# Patient Record
Sex: Female | Born: 1945 | Race: White | Hispanic: No | Smoking: Former smoker
Health system: Southern US, Community
[De-identification: ages and names within clinical notes are randomized; demographics above are authoritative.]

## PROBLEM LIST (undated history)

## (undated) DIAGNOSIS — E739 Lactose intolerance, unspecified: Secondary | ICD-10-CM

## (undated) DIAGNOSIS — I10 Essential (primary) hypertension: Secondary | ICD-10-CM

## (undated) DIAGNOSIS — R51 Headache: Secondary | ICD-10-CM

## (undated) DIAGNOSIS — M549 Dorsalgia, unspecified: Secondary | ICD-10-CM

## (undated) DIAGNOSIS — K143 Hypertrophy of tongue papillae: Secondary | ICD-10-CM

## (undated) DIAGNOSIS — R0600 Dyspnea, unspecified: Secondary | ICD-10-CM

## (undated) DIAGNOSIS — E785 Hyperlipidemia, unspecified: Principal | ICD-10-CM

## (undated) DIAGNOSIS — Z9189 Other specified personal risk factors, not elsewhere classified: Secondary | ICD-10-CM

## (undated) DIAGNOSIS — R7303 Prediabetes: Secondary | ICD-10-CM

## (undated) DIAGNOSIS — J449 Chronic obstructive pulmonary disease, unspecified: Secondary | ICD-10-CM

## (undated) DIAGNOSIS — R350 Frequency of micturition: Secondary | ICD-10-CM

## (undated) HISTORY — DX: Hyperlipidemia, unspecified: E78.5

## (undated) HISTORY — PX: TUBAL LIGATION: SHX77

## (undated) HISTORY — DX: Essential (primary) hypertension: I10

## (undated) HISTORY — DX: Hypertrophy of tongue papillae: K14.3

## (undated) HISTORY — DX: Other specified personal risk factors, not elsewhere classified: Z91.89

## (undated) HISTORY — DX: Dorsalgia, unspecified: M54.9

## (undated) HISTORY — DX: Headache: R51

## (undated) HISTORY — DX: Lactose intolerance, unspecified: E73.9

## (undated) HISTORY — PX: TONSILLECTOMY: SUR1361

## (undated) HISTORY — DX: Chronic obstructive pulmonary disease, unspecified: J44.9

## (undated) HISTORY — PX: DILATION AND CURETTAGE OF UTERUS: SHX78

## (undated) HISTORY — DX: Frequency of micturition: R35.0

---

## 1983-03-30 HISTORY — PX: DILATION AND CURETTAGE OF UTERUS: SHX78

## 1983-03-30 HISTORY — PX: TUBAL LIGATION: SHX77

## 2000-08-22 ENCOUNTER — Emergency Department (HOSPITAL_COMMUNITY): Admission: EM | Admit: 2000-08-22 | Discharge: 2000-08-22 | Payer: Self-pay | Admitting: Emergency Medicine

## 2001-03-29 HISTORY — PX: CARDIAC CATHETERIZATION: SHX172

## 2001-05-31 ENCOUNTER — Other Ambulatory Visit: Admission: RE | Admit: 2001-05-31 | Discharge: 2001-05-31 | Payer: Self-pay | Admitting: *Deleted

## 2001-11-06 ENCOUNTER — Encounter: Admission: RE | Admit: 2001-11-06 | Discharge: 2001-11-06 | Payer: Self-pay | Admitting: Internal Medicine

## 2001-11-06 ENCOUNTER — Encounter: Payer: Self-pay | Admitting: Internal Medicine

## 2002-03-29 ENCOUNTER — Encounter: Payer: Self-pay | Admitting: Internal Medicine

## 2002-03-29 LAB — CONVERTED CEMR LAB

## 2002-05-24 ENCOUNTER — Encounter: Payer: Self-pay | Admitting: Internal Medicine

## 2002-05-24 ENCOUNTER — Inpatient Hospital Stay (HOSPITAL_COMMUNITY): Admission: EM | Admit: 2002-05-24 | Discharge: 2002-05-26 | Payer: Self-pay | Admitting: Emergency Medicine

## 2002-09-06 ENCOUNTER — Other Ambulatory Visit: Admission: RE | Admit: 2002-09-06 | Discharge: 2002-09-06 | Payer: Self-pay | Admitting: *Deleted

## 2004-04-17 ENCOUNTER — Other Ambulatory Visit: Admission: RE | Admit: 2004-04-17 | Discharge: 2004-04-17 | Payer: Self-pay | Admitting: *Deleted

## 2004-05-08 ENCOUNTER — Encounter: Admission: RE | Admit: 2004-05-08 | Discharge: 2004-05-08 | Payer: Self-pay | Admitting: *Deleted

## 2005-07-08 ENCOUNTER — Encounter: Admission: RE | Admit: 2005-07-08 | Discharge: 2005-07-08 | Payer: Self-pay | Admitting: Obstetrics and Gynecology

## 2005-07-08 ENCOUNTER — Other Ambulatory Visit: Admission: RE | Admit: 2005-07-08 | Discharge: 2005-07-08 | Payer: Self-pay | Admitting: Obstetrics and Gynecology

## 2005-07-12 ENCOUNTER — Ambulatory Visit: Payer: Self-pay | Admitting: Endocrinology

## 2005-07-17 ENCOUNTER — Ambulatory Visit: Payer: Self-pay | Admitting: Family Medicine

## 2006-01-06 ENCOUNTER — Ambulatory Visit: Payer: Self-pay | Admitting: Internal Medicine

## 2006-05-18 ENCOUNTER — Emergency Department (HOSPITAL_COMMUNITY): Admission: EM | Admit: 2006-05-18 | Discharge: 2006-05-18 | Payer: Self-pay | Admitting: Family Medicine

## 2006-11-01 ENCOUNTER — Encounter: Payer: Self-pay | Admitting: Internal Medicine

## 2006-11-01 DIAGNOSIS — J449 Chronic obstructive pulmonary disease, unspecified: Secondary | ICD-10-CM | POA: Insufficient documentation

## 2006-11-01 DIAGNOSIS — J4489 Other specified chronic obstructive pulmonary disease: Secondary | ICD-10-CM

## 2006-11-01 DIAGNOSIS — I10 Essential (primary) hypertension: Secondary | ICD-10-CM | POA: Insufficient documentation

## 2006-11-01 HISTORY — DX: Other specified chronic obstructive pulmonary disease: J44.89

## 2006-11-01 HISTORY — DX: Essential (primary) hypertension: I10

## 2006-11-01 HISTORY — DX: Chronic obstructive pulmonary disease, unspecified: J44.9

## 2006-12-09 ENCOUNTER — Other Ambulatory Visit: Admission: RE | Admit: 2006-12-09 | Discharge: 2006-12-09 | Payer: Self-pay | Admitting: Obstetrics & Gynecology

## 2006-12-09 ENCOUNTER — Encounter: Admission: RE | Admit: 2006-12-09 | Discharge: 2006-12-09 | Payer: Self-pay | Admitting: Obstetrics & Gynecology

## 2006-12-19 ENCOUNTER — Encounter: Payer: Self-pay | Admitting: *Deleted

## 2007-01-06 ENCOUNTER — Encounter: Payer: Self-pay | Admitting: Internal Medicine

## 2007-01-06 ENCOUNTER — Ambulatory Visit: Payer: Self-pay | Admitting: Internal Medicine

## 2007-01-06 DIAGNOSIS — Z9189 Other specified personal risk factors, not elsewhere classified: Secondary | ICD-10-CM | POA: Insufficient documentation

## 2007-01-06 HISTORY — DX: Other specified personal risk factors, not elsewhere classified: Z91.89

## 2007-01-06 LAB — CONVERTED CEMR LAB
ALT: 10 units/L (ref 0–35)
Albumin: 3.8 g/dL (ref 3.5–5.2)
Alkaline Phosphatase: 97 units/L (ref 39–117)
BUN: 7 mg/dL (ref 6–23)
Basophils Absolute: 0 10*3/uL (ref 0.0–0.1)
Basophils Relative: 0.5 % (ref 0.0–1.0)
Bilirubin Urine: NEGATIVE
CO2: 28 meq/L (ref 19–32)
Calcium: 9.3 mg/dL (ref 8.4–10.5)
Creatinine, Ser: 0.8 mg/dL (ref 0.4–1.2)
Crystals: NEGATIVE
GFR calc Af Amer: 94 mL/min
Ketones, ur: NEGATIVE mg/dL
LDL Cholesterol: 113 mg/dL — ABNORMAL HIGH (ref 0–99)
MCHC: 34.9 g/dL (ref 30.0–36.0)
Monocytes Relative: 7.4 % (ref 3.0–11.0)
Platelets: 246 10*3/uL (ref 150–400)
Potassium: 4 meq/L (ref 3.5–5.1)
RBC: 4.05 M/uL (ref 3.87–5.11)
RDW: 12.4 % (ref 11.5–14.6)
Specific Gravity, Urine: <=1.005 (ref 1.000–1.03)
TSH: 0.54 microintl units/mL (ref 0.35–5.50)
Total Bilirubin: 0.9 mg/dL (ref 0.3–1.2)
Total CHOL/HDL Ratio: 4.7
Total Protein, Urine: NEGATIVE mg/dL
Total Protein: 6.9 g/dL (ref 6.0–8.3)
Triglycerides: 199 mg/dL — ABNORMAL HIGH (ref 0–149)
Urine Glucose: NEGATIVE mg/dL
VLDL: 40 mg/dL (ref 0–40)
pH: 7 (ref 5.0–8.0)

## 2007-04-06 ENCOUNTER — Ambulatory Visit: Payer: Self-pay | Admitting: Internal Medicine

## 2007-04-13 ENCOUNTER — Ambulatory Visit: Payer: Self-pay | Admitting: Internal Medicine

## 2007-04-26 ENCOUNTER — Encounter: Payer: Self-pay | Admitting: Internal Medicine

## 2007-04-26 ENCOUNTER — Ambulatory Visit: Payer: Self-pay | Admitting: Internal Medicine

## 2007-04-26 ENCOUNTER — Encounter (INDEPENDENT_AMBULATORY_CARE_PROVIDER_SITE_OTHER): Payer: Self-pay | Admitting: *Deleted

## 2008-02-06 ENCOUNTER — Encounter: Admission: RE | Admit: 2008-02-06 | Discharge: 2008-02-06 | Payer: Self-pay | Admitting: Obstetrics & Gynecology

## 2008-03-08 ENCOUNTER — Other Ambulatory Visit: Admission: RE | Admit: 2008-03-08 | Discharge: 2008-03-08 | Payer: Self-pay | Admitting: Obstetrics & Gynecology

## 2008-11-29 ENCOUNTER — Ambulatory Visit: Payer: Self-pay | Admitting: Internal Medicine

## 2008-11-29 LAB — CONVERTED CEMR LAB
Albumin: 3.8 g/dL (ref 3.5–5.2)
Alkaline Phosphatase: 99 units/L (ref 39–117)
Basophils Absolute: 0 10*3/uL (ref 0.0–0.1)
Basophils Relative: 0.3 % (ref 0.0–3.0)
Bilirubin Urine: NEGATIVE
CO2: 28 meq/L (ref 19–32)
Chloride: 104 meq/L (ref 96–112)
Cholesterol: 208 mg/dL — ABNORMAL HIGH (ref 0–200)
Eosinophils Absolute: 0.1 10*3/uL (ref 0.0–0.7)
Glucose, Bld: 121 mg/dL — ABNORMAL HIGH (ref 70–99)
HCT: 39.2 % (ref 36.0–46.0)
HDL: 45 mg/dL (ref >39.00)
Hemoglobin: 13.9 g/dL (ref 12.0–15.0)
Lymphs Abs: 2.3 10*3/uL (ref 0.7–4.0)
MCHC: 35.4 g/dL (ref 30.0–36.0)
MCV: 94.5 fL (ref 78.0–100.0)
Monocytes Absolute: 0.6 10*3/uL (ref 0.1–1.0)
Neutro Abs: 8.2 10*3/uL — ABNORMAL HIGH (ref 1.4–7.7)
Nitrite: NEGATIVE
RBC: 4.15 M/uL (ref 3.87–5.11)
RDW: 12.5 % (ref 11.5–14.6)
Sodium: 139 meq/L (ref 135–145)
Total Protein, Urine: NEGATIVE mg/dL
Total Protein: 7 g/dL (ref 6.0–8.3)
Triglycerides: 183 mg/dL — ABNORMAL HIGH (ref 0.0–149.0)
Urine Glucose: NEGATIVE mg/dL
pH: 7 (ref 5.0–8.0)

## 2008-12-03 LAB — CONVERTED CEMR LAB: Vit D, 25-Hydroxy: 31 ng/mL (ref 30–89)

## 2009-03-14 ENCOUNTER — Ambulatory Visit: Payer: Self-pay | Admitting: Internal Medicine

## 2009-03-14 DIAGNOSIS — M549 Dorsalgia, unspecified: Secondary | ICD-10-CM | POA: Insufficient documentation

## 2009-03-14 DIAGNOSIS — E739 Lactose intolerance, unspecified: Secondary | ICD-10-CM

## 2009-03-14 DIAGNOSIS — R350 Frequency of micturition: Secondary | ICD-10-CM

## 2009-03-14 DIAGNOSIS — R519 Headache, unspecified: Secondary | ICD-10-CM | POA: Insufficient documentation

## 2009-03-14 DIAGNOSIS — R51 Headache: Secondary | ICD-10-CM

## 2009-03-14 HISTORY — DX: Lactose intolerance, unspecified: E73.9

## 2009-03-14 HISTORY — DX: Dorsalgia, unspecified: M54.9

## 2009-03-14 HISTORY — DX: Frequency of micturition: R35.0

## 2009-03-14 HISTORY — DX: Headache: R51

## 2009-03-14 LAB — CONVERTED CEMR LAB
Bilirubin Urine: NEGATIVE
CO2: 29 meq/L (ref 19–32)
Calcium: 9.4 mg/dL (ref 8.4–10.5)
Creatinine, Ser: 0.8 mg/dL (ref 0.4–1.2)
Glucose, Bld: 88 mg/dL (ref 70–99)
Nitrite: NEGATIVE
pH: 5.5 (ref 5.0–8.0)

## 2009-06-05 ENCOUNTER — Ambulatory Visit: Payer: Self-pay | Admitting: Internal Medicine

## 2009-06-05 ENCOUNTER — Encounter: Admission: RE | Admit: 2009-06-05 | Discharge: 2009-06-05 | Payer: Self-pay | Admitting: Obstetrics & Gynecology

## 2009-06-05 DIAGNOSIS — K143 Hypertrophy of tongue papillae: Secondary | ICD-10-CM

## 2009-06-05 DIAGNOSIS — J069 Acute upper respiratory infection, unspecified: Secondary | ICD-10-CM | POA: Insufficient documentation

## 2009-06-05 DIAGNOSIS — G4700 Insomnia, unspecified: Secondary | ICD-10-CM

## 2009-06-05 HISTORY — DX: Hypertrophy of tongue papillae: K14.3

## 2009-12-10 ENCOUNTER — Telehealth: Payer: Self-pay | Admitting: Internal Medicine

## 2010-04-28 NOTE — Assessment & Plan Note (Signed)
Summary: Leah Mata   Vital Signs:  Patient profile:   65 year old female Height:      62 inches Weight:      177.25 pounds BMI:     32.54 O2 Sat:      97 % on Room air Temp:     97.7 degrees F oral Pulse rate:   95 / minute BP sitting:   160 / 82  (left arm) Cuff size:   regular  Vitals Entered ByZella Ball Ewing (June 05, 2009 2:46 PM)  O2 Flow:  Room air CC: Leah on tongue/RE   CC:  Leah on tongue/RE.  History of Present Illness: here after recent URI with ST and congestion last wk that has seemed to have resolved, but still with some swollen bumps to the post tongue that a nurse friend of hers told her might be abnormal and should have it checked;  pt denies further fever, pain/st, cough or any swelling to the head and neck area.  Has not quit smoking so is very concnerned.  Still with ongong sleep problem with getting to sleep, denies worsenind depressive symptoms, suicidal ideation, or increased anxiety or panic.  Overall good complaicne with meds, seems to tolerate well,  also denies worsening recent polyuria, polydipsia.  Trying to follow lower chol , lower salt diet,  BP at home prior to recent illness has been more often with SBP < 140 per pt.  Pt denies CP, sob, doe, wheezing, orthopnea, pnd, worsening LE edema, palps, dizziness or syncope   Pt denies new neuro symptoms such as headache, facial or extremity weakness   Preventive Screening-Counseling & Management      Drug Use:  no.    Problems Prior to Update: 1)  Urinary Frequency  (ICD-788.41) 2)  Glucose Intolerance  (ICD-271.3) 3)  Back Pain  (ICD-724.5) 4)  Headache  (ICD-784.0) 5)  Preventive Health Care  (ICD-V70.0) 6)  Family History of Cad Female 1st Degree Relative <50  (ICD-V17.3) 7)  Family History of Cad Female 1st Degree Relative <60  (ICD-V16.49) 8)  Insomnia, Hx of  (ICD-V15.89) 9)  Hypertension  (ICD-401.9) 10)  COPD  (ICD-496)  Medications Prior to Update: 1)  Lisinopril 20 Mg   Tabs (Lisinopril) .... Take 1 By Mouth Once Daily 2)  Temazepam 15 Mg  Caps (Temazepam) .... Take 1-2 By Mouth At Bedtime As Needed - To Fill Jun 02, 2009 3)  Aspir-Low 81 Mg Tbec (Aspirin) .Marland Kitchen.. 1po Once Daily 4)  Zantac 300 Mg Tabs (Ranitidine Hcl) .Marland Kitchen.. 1po Once Daily As Needed 5)  Flexeril 5 Mg Tabs (Cyclobenzaprine Hcl) .Marland Kitchen.. 1 By Mouth Three Times A Day As Needed 6)  Hydrocodone-Acetaminophen 5-325 Mg Tabs (Hydrocodone-Acetaminophen) .Marland Kitchen.. 1 By Mouth Q 6 Hrs As Needed Pain 7)  Ciprofloxacin Hcl 500 Mg Tabs (Ciprofloxacin Hcl) .Marland Kitchen.. 1po Two Times A Day  Current Medications (verified): 1)  Lisinopril 20 Mg  Tabs (Lisinopril) .... Take 1 By Mouth Once Daily 2)  Temazepam 15 Mg  Caps (Temazepam) .... Take 1-2 By Mouth At Bedtime As Needed - To Fill Mar 9, 20115 3)  Aspir-Low 81 Mg Tbec (Aspirin) .Marland Kitchen.. 1po Once Daily 4)  Zegerid 20-1100 Mg Caps (Omeprazole-Sodium Bicarbonate) .Marland Kitchen.. 1 By Mouth Two Times A Day  Allergies (verified): 1)  ! Hydrochlorothiazide (Hydrochlorothiazide)  Past History:  Past Medical History: Last updated: 04/06/2007 COPD Hypertension insomnia vit D deficient  Past Surgical History: Last updated: 01/06/2007 s/p d&c  Social History: Last updated: 06/05/2009  Current Smoker Alcohol use-no Drug use-no  Risk Factors: Smoking Status: current (01/06/2007) Packs/Day: 1 PPD (11/01/2006)  Social History: Current Smoker Alcohol use-no Drug use-no Drug Use:  no  Review of Systems       all otherwise negative per pt -    Physical Exam  General:  alert and overweight-appearing.   Head:  normocephalic and atraumatic.   Eyes:  vision grossly intact, pupils equal, and pupils round.   Ears:  bilat tm;s mild pink but not red, bulging, canals are clear, sinus nontender Nose:  no external deformity and no nasal discharge.   Mouth:  pharyngeal erythema and fair dentition. , and seems to have somewhat prominent post tonuge papillae but not overly so, and no other  mass, swelling, abscess or exudate Neck:  supple and no masses.   Lungs:  normal respiratory effort, R decreased breath sounds, and L decreased breath sounds.   Heart:  normal rate and regular rhythm.   Extremities:  no edema, no erythema    Impression & Recommendations:  Problem # 1:  URI (ICD-465.9)  Her updated medication list for this problem includes:    Aspir-low 81 Mg Tbec (Aspirin) .Marland Kitchen... 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 Take 1-2 by mouth at bedtime as needed - to fill mar 9, 20115 treat as above, f/u any worsening signs or symptoms , stable, med refilled today  Problem # 4:  HYPERTENSION (ICD-401.9)  Her updated medication list for this problem includes:    Lisinopril 20 Mg Tabs (Lisinopril) .Marland Kitchen... Take 1 by mouth once daily  BP today: 160/82 Prior BP: 132/82 (03/14/2009)  Labs Reviewed: K+: 4.1 (03/14/2009) Creat: : 0.8 (03/14/2009)   Chol: 208 (11/29/2008)   HDL: 45.00 (11/29/2008)   LDL: 113 (01/06/2007)   TG: 183.0 (11/29/2008) mild elev today, likely situational, ok to follow, continue same treatment   Complete Medication List: 1)  Lisinopril 20 Mg Tabs (Lisinopril) .... Take 1 by mouth once daily 2)  Temazepam 15 Mg Caps (Temazepam) .... Take 1-2 by mouth at bedtime as needed - to fill mar 9, 20115 3)  Aspir-low 81 Mg Tbec (Aspirin) .Marland Kitchen.. 1po once daily 4)  Zegerid 20-1100 Mg Caps (Omeprazole-sodium bicarbonate) .Marland Kitchen.. 1 by mouth two times a day  Patient Instructions: 1)  Please take all new medications as prescribed  2)  Continue all  previous medications as before this visit  3)  Please schedule a follow-up appointment in 6 months with CPX labs Prescriptions: TEMAZEPAM 15 MG  CAPS (TEMAZEPAM) take 1-2 by mouth at bedtime as needed - to fill mar 9, 20115  #60 x 5   Entered and Authorized by:   Corwin Levins MD   Signed by:   Corwin Levins MD on 06/05/2009   Method used:   Print then Give to Patient   RxID:   [id number] TEMAZEPAM 15 MG  CAPS (TEMAZEPAM) take 1-2 by mouth at bedtime as needed - to fill mar 9, 20115  #60 x 0   Entered and Authorized by:   Corwin Levins MD  Signed by:   Corwin Levins MD on 06/05/2009   Method used:   Print then Give to Patient   RxID:   [id number] ZEGERID 20-1100 MG CAPS (OMEPRAZOLE-SODIUM BICARBONATE) 1 by mouth two times a day  #60 x 11   Entered and Authorized by:   Corwin Levins MD   Signed by:   Corwin Levins MD on 06/05/2009   Method used:   Print then Give to Patient   RxID:   [id number]

## 2010-04-28 NOTE — Progress Notes (Signed)
Summary: medication refill  Phone Note Refill Request Message from:  Fax from Pharmacy on December 10, 2009 9:54 AM  Refills Requested: Medication #1:  TEMAZEPAM 15 MG  CAPS take 1-2 by mouth at bedtime as needed - to fill mar 9   Dosage confirmed as above?Dosage Confirmed   Last Refilled: 06/05/2009   Notes: [facility] [address] [phone] Initial call taken by: Zella Ball Ewing CMA Duncan Dull),  December 10, 2009 9:54 AM  Follow-up for Phone Call        done hardcopy to [facility] side B - dahlia  Follow-up by: Corwin Levins MD,  December 10, 2009 1:12 PM  Additional Follow-up for Phone Call Additional follow up Details #1::        rx faxed to [facility] Additional Follow-up by: Brenton Grills MA,  December 10, 2009 1:35 PM    New/Updated Medications: TEMAZEPAM 15 MG  CAPS (TEMAZEPAM) take 1-2 by mouth at bedtime as needed - to fill sept 14, 2011 Prescriptions: TEMAZEPAM 15 MG  CAPS (TEMAZEPAM) take 1-2 by mouth at bedtime as needed - to fill sept 14, 2011  #60 x 5   Entered and Authorized by:   Corwin Levins MD   Signed by:   Corwin Levins MD on 12/10/2009   Method used:   Print then Give to Patient   RxID:   [id number]

## 2010-06-29 ENCOUNTER — Other Ambulatory Visit: Payer: Self-pay

## 2010-06-29 MED ORDER — TEMAZEPAM 15 MG PO CAPS: 15.0000 mg | ORAL_CAPSULE | Freq: Every evening | ORAL | Status: DC | PRN

## 2010-06-29 NOTE — Telephone Encounter (Signed)
[facility] requesting refill on Temazepam 15 mg. Last refill 12/10/09 #60 with 5 refills, last OV 06/05/09.

## 2010-06-29 NOTE — Telephone Encounter (Signed)
Rx faxed to pharmacy  

## 2010-06-29 NOTE — Telephone Encounter (Signed)
Pt due now for cpx with labs - please schedule  rx Done hardcopy to dahlia/LIM B   

## 2010-08-14 NOTE — Cardiovascular Report (Signed)
   NAMERAIZEL, WESOLOWSKI NO.:  [id number]   MEDICAL RECORD NO.:  [id number]                   PATIENT TYPE:  INP   LOCATION:  3705                                 FACILITY:  [facility]   PHYSICIAN:  Charlton Haws, M.D. [facility]              DATE OF BIRTH:  [birth year]   DATE OF PROCEDURE:  DATE OF DISCHARGE:                              CARDIAC CATHETERIZATION   PROCEDURE:  Coronary arteriography.   INDICATIONS FOR PROCEDURE:  Chest pain.   PROCEDURAL NOTE:  Standard catheterization was done from the right femoral  artery.  JL4 and JR4 catheters were used.   FINDINGS:  1. The left main coronary artery was normal  2. The left anterior descending artery was normal.  The first and second     diagonal branches were normal.  3. The circumflex coronary artery was normal.  4. The right coronary artery was dominant.  It was normal.  5. RAO ventriculography:  RAO ventriculography showed normal ejection     fraction of 60%.  There were no wall motion abnormalities.  The proximal     aortic root was normal without evidence of aneurysm or dissection as seen     after the left ventriculogram past the aortic valve.  6. LV pressure was in the 138/18 range.  The aortic pressure was in the     138/72 range.   IMPRESSION:  The patient does not appear to have coronary disease to explain  her chest pain.  She will go to the recovery area to have the sheath taken  out.  If internal medicine does not plan for the workup, she can probably be  discharged in the morning.                                                Charlton Haws, M.D. [facility]    PN/MEDQ  D:  05/25/2002  T:  05/26/2002  Job:  [id number]   cc:   Georgina Quint. Plotnikov, M.D. [facility]

## 2010-08-14 NOTE — H&P (Signed)
Leah Mata, Leah Mata                             ACCOUNT NO.:  [id number]   MEDICAL RECORD NO.:  [id number]                   PATIENT TYPE:  EMS   LOCATION:  MINO                                 FACILITY:  [facility]   PHYSICIAN:  Georgina Quint. Plotnikov, M.D. [facility]      DATE OF BIRTH:  1945/10/17   DATE OF ADMISSION:  05/24/2002  DATE OF DISCHARGE:                                HISTORY & PHYSICAL   CHIEF COMPLAINT:  Chest pain.   HISTORY OF PRESENT ILLNESS:  The patient is a 65 year old female who  presents to the office with complaint of an episode of severe chest  heaviness radiating into the left arm that woke her up from sleep the night  before last and lasted for about 30 minutes.  Eventually it went away.  No  recurrence.  She denies being short of breath or presyncopal with it.   PAST MEDICAL HISTORY:  Unremarkable.   FAMILY HISTORY:  Mother, father, and brother all had coronary artery disease  in their 17s or 64s.   SOCIAL HISTORY:  She is married.  She smokes.   CURRENT MEDICATIONS:  None.   ALLERGIES:  None.   REVIEW OF SYSTEMS:  No exertional symptoms.  No chronic cough.  No leg  swelling.  No syncope.  The rest is negative or as above.   PHYSICAL EXAMINATION:  VITAL SIGNS:  Blood pressure 132/80, pulse 92,  temperature 99.2 degrees.  WEIGHT:  154 pounds.  GENERAL APPEARANCE:  She is in no acute distress.  She is alert, oriented,  and cooperative.  Slightly anxious.  HEENT:  Moist mucosa.  _________ throat.  NECK:  Supple.  No thyromegaly or bruit.  LUNGS:  Clear.  No wheezes or rales.  No dullness to percussion.  HEART:  S1 and S2 tachycardic.  No __________ percussion.  No gallop.  ABDOMEN:  Soft and nontender without organomegaly.  No mass felt.  No  pulsating masses.  EXTREMITIES:  Good peripheral pulses.  Lower extremities without edema.   LABORATORY DATA:  EKG with sinus tachycardia, heart rate 89, and no acute  changes.   ASSESSMENT AND PLAN:  1. Episode of  chest pain consistent with unstable angina.  The patient was     sent to the ER for cardiac evaluation.  Obtain cardiology consult     (called).  She was given aspirin.  Will place IV and start on Coreg.  2.     Chronic obstructive pulmonary disease.  Unknown degree due to smoking.  She      was advised to discontinue smoking.  3. Strong family history of coronary artery disease.                                               Georgina Quint. Plotnikov, M.D. [facility]  AVP/MEDQ  D:  05/24/2002  T:  05/24/2002  Job:  [id number]

## 2010-08-14 NOTE — Discharge Summary (Signed)
Leah Mata, Leah Mata                             ACCOUNT NO.:  [id number]   MEDICAL RECORD NO.:  [id number]                   PATIENT TYPE:  INP   LOCATION:  3705                                 FACILITY:  [facility]   PHYSICIAN:  Georgina Quint. Plotnikov, M.D. [facility]      DATE OF BIRTH:  [birth year]   DATE OF ADMISSION:  05/24/2002  DATE OF DISCHARGE:  05/25/2002                                 DISCHARGE SUMMARY   DISCHARGE DIAGNOSES:  1. Chest pain.  2. Dyspnea on exertion.  3. Tobacco use.   BRIEF HISTORY AND PHYSICAL:  The patient is a 65 year old white female who  presents with severe chest heaviness radiating to the left arm.  This woke  her from sleep.  It lasted 30 minutes and eventually went away.  She has not  had any further recurrence.  She states that she has not previously had any  dyspnea on exertion, orthopnea, paroxysmal nocturnal dyspnea, pedal edema,  palpitations, presyncope, syncope, or chest pain.   PAST MEDICAL HISTORY:  She does not have any significant past medical  history.   FAMILY HISTORY:  She does have family history positive for coronary artery  disease in both of her parents.  She also had a brother that had an MI at  age 47.   HOSPITAL COURSE:  1. CARDIOVASCULAR:  The patient presented with what sounded like unstable     angina.  She was evaluated by cardiology.  She was started on aspirin and     Coreg.  They placed her on the cath schedule for May 25, 2002.     Cardiac cath reveals normal coronaries with normal LV and EF of 60%.  He     noted the aortic route was normal on left ventriculogram.  Dr. Eden Emms     noted no significant coronary disease.  No further cardiac workup was     needed.  We also did a CT of the chest to rule out PE.  D-dimer was     elevated.  CT of the chest was negative for pulmonary embolus.   1. PULMONARY:  The patient probably has underlying COPD and continues to use     tobacco.  We have advised tobacco cessation.   LABORATORY DATA:  At discharge, urinalysis was negative.  Cardiac enzymes  were negative.  CMET was normal.  CBC was essentially normal.  TSH was  0.780, D-dimer 0.54, coags were normal.   MEDICATIONS:  None.    SPECIAL INSTRUCTIONS:  She has been instructed not to smoke.   FOLLOW UP:  Follow up with Dr. Posey Rea in 1-2 weeks.        Cornell Barman, P.A. [facility]                  Aleksei V. Plotnikov, M.D. [facility]    LC/MEDQ  D:  05/25/2002  T:  05/25/2002  Job:  [id number]   cc:  Georgina Quint. Plotnikov, M.D. [facility]   Olga Millers, M.D. [facility]

## 2010-08-14 NOTE — Consult Note (Signed)
Leah Mata, Leah Mata NO.:  [id number]   MEDICAL RECORD NO.:  [id number]                   PATIENT TYPE:  INP   LOCATION:  1843                                 FACILITY:  [facility]   PHYSICIAN:  Olga Millers, M.D. [facility]            DATE OF BIRTH:  1945/09/30   DATE OF CONSULTATION:  DATE OF DISCHARGE:                                   CONSULTATION   REASON FOR CONSULTATION:  The patient is a 65 year old female with no prior  cardiac history who we were asked to evaluate for chest pain.   HISTORY OF PRESENT ILLNESS:  She states that she typically does not have  dyspnea on exertion, orthopnea, PND, pedal edema, palpitations, presyncope,  syncope, or chest pain.  Tuesday night, she developed substernal chest pain.  It was described as a pressure.  It radiated to her left upper extremity  and there was associated shortness of breath but there was no nausea,  vomiting, or diaphoresis.  The pain lasted for approximately 30 minutes and  then resolved spontaneously.  Since that time, she has had worsening dyspnea  on exertion.  There is no orthopnea, PND, pedal edema, hemoptysis, or recent  leg injury.  There is no recent travel.  She is being admitted by the  Primary Care Service and we were asked to further evaluate.  She is not  having pain at present.  She is on no medications at home.   ALLERGIES:  She has no known drug allergies.   SOCIAL HISTORY:  She does smoke one pack per day for the past 20 years.  She  did not consume alcohol.  She lives in [place].   FAMILY HISTORY:  Positive for coronary artery disease in her mother and  father.  Her brother also has had a myocardial infarction at age 73.   PAST MEDICAL HISTORY:  There is no diabetes mellitus, hypertension, or  hyperlipidemia.  She does not documented previous heart disease or lung  disease.  She has had a prior tonsillectomy.   REVIEW OF SYMPTOMS:  There is no recent fevers, chills, or  productive cough.  There is no hemoptysis.  She denies any dysphagia, odynophagia, melena, or  hematochezia.  There is no dysuria or hematuria.  There is no rash or  seizure activity.  She denies any orthopnea, PND, or pedal edema.  There is  dyspnea on exertion.  There is no claudication.  The remainder of the review  of systems is negative.   PHYSICAL EXAMINATION:  VITAL SIGNS:  Blood pressure of 146/88 and her pulse  is 80.  She is afebrile.  She is 100% on room air.  GENERAL:  She is well-developed, well-nourished, and in no acute distress.  SKIN:  Warm and dry.  NEUROLOGICAL:  She does not appear to be depressed and there is no  peripheral clubbing.  Grossly intact.  HEENT:  Unremarkable with normal eyelids.  NECK:  Supple with normal upstroke bilaterally.  There are no bruits noted.  There is no jugular venous distention or thyromegaly.  CHEST:  Clear to auscultation.  Normal expansion.  CARDIOVASCULAR:  Regular rate and rhythm with normal S1 and S2.  There are  no murmurs, rubs, or gallops noted.  Her heart sounds are distant.  ABDOMEN:  Nontender, nondistended.  Positive bowel sounds.  No  hepatosplenomegaly and no masses appreciated.  There is no abdominal bruit.  She has 2+ femoral pulses bilaterally and no bruits.  EXTREMITIES:  No edema and I can palpate no cords.  She has 2+ dorsalis  pedis pulses bilaterally.   LABORATORY DATA:  Her electrocardiogram shows normal sinus rhythm at a rate  of 74.  There are no diagnostic teachings.  Her hemoglobin and hematocrit  are 14 and 40 respectively with a white blood count of 11.  Her BUN and  creatinine are 6 and 0.8.  Initial enzymes are negative.   DIAGNOSES:  1. Chest pain concerning for angina.  2. Tobacco abuse.  3. Positive family history.   PLAN:  The patient presents with chest pain that is very concerning for  angina.  She does have multiple risk factors including tobacco use and a  strong family history.  I discussed  the risks and benefits of cardiac  catheterization with the patient and she agrees to proceed.  We will cycle  enzymes to exclude infarct, although her initial enzymes are negative.  We  will also weigh her chest x-ray.  She does complain of significant dyspnea  on exertion since the event and I will check a d-dimer to screen for  pulmonary embolus.  We did discuss risk factor modification including  discontinuing her tobacco use.  We will make further recommendations once we  have the above information.                                                Olga Millers, M.D. [facility]    BC/MEDQ  D:  05/24/2002  T:  05/24/2002  Job:  [id number]

## 2010-08-19 ENCOUNTER — Other Ambulatory Visit: Payer: Self-pay

## 2010-08-19 ENCOUNTER — Encounter: Payer: Self-pay | Admitting: Internal Medicine

## 2010-08-19 DIAGNOSIS — R7303 Prediabetes: Secondary | ICD-10-CM | POA: Insufficient documentation

## 2010-08-19 DIAGNOSIS — Z0001 Encounter for general adult medical examination with abnormal findings: Secondary | ICD-10-CM | POA: Insufficient documentation

## 2010-08-19 DIAGNOSIS — Z0000 Encounter for general adult medical examination without abnormal findings: Secondary | ICD-10-CM | POA: Insufficient documentation

## 2010-08-19 MED ORDER — LISINOPRIL 20 MG PO TABS: 20.0000 mg | ORAL_TABLET | Freq: Every day | ORAL | Status: DC

## 2010-08-21 ENCOUNTER — Encounter: Payer: Self-pay | Admitting: Internal Medicine

## 2010-08-21 ENCOUNTER — Ambulatory Visit (INDEPENDENT_AMBULATORY_CARE_PROVIDER_SITE_OTHER): Payer: BC Managed Care – PPO | Admitting: Internal Medicine

## 2010-08-21 VITALS — BP 142/72 | HR 84 | Temp 98.2°F | Ht 64.0 in | Wt 160.4 lb

## 2010-08-21 DIAGNOSIS — L259 Unspecified contact dermatitis, unspecified cause: Secondary | ICD-10-CM

## 2010-08-21 DIAGNOSIS — R7309 Other abnormal glucose: Secondary | ICD-10-CM

## 2010-08-21 DIAGNOSIS — Z0000 Encounter for general adult medical examination without abnormal findings: Secondary | ICD-10-CM

## 2010-08-21 DIAGNOSIS — L301 Dyshidrosis [pompholyx]: Secondary | ICD-10-CM | POA: Insufficient documentation

## 2010-08-21 DIAGNOSIS — R7302 Impaired glucose tolerance (oral): Secondary | ICD-10-CM

## 2010-08-21 MED ORDER — TEMAZEPAM 15 MG PO CAPS: 30.0000 mg | ORAL_CAPSULE | Freq: Every evening | ORAL | Status: DC | PRN

## 2010-08-21 MED ORDER — LISINOPRIL 20 MG PO TABS: 20.0000 mg | ORAL_TABLET | Freq: Every day | ORAL | Status: DC

## 2010-08-21 MED ORDER — TRIAMCINOLONE ACETONIDE 0.1 % EX CREA: TOPICAL_CREAM | Freq: Two times a day (BID) | CUTANEOUS | Status: DC

## 2010-08-21 NOTE — Assessment & Plan Note (Signed)

## 2010-08-21 NOTE — Progress Notes (Signed)
Subjective:    Patient ID: Leah Mata, female    DOB: 1945-08-11, 65 y.o.   MRN: [id number]  HPIHere for wellness and f/u;  Overall doing ok;  Pt denies CP, worsening SOB, DOE, wheezing, orthopnea, PND, worsening LE edema, palpitations, dizziness or syncope.  Pt denies neurological change such as new Headache, facial or extremity weakness.  Pt denies polydipsia, polyuria, or low sugar symptoms. Pt states overall good compliance with treatment and medications, good tolerability, and trying to follow lower cholesterol diet.  Pt denies worsening depressive symptoms, suicidal ideation or panic. No fever, wt loss, night sweats, loss of appetite, or other constitutional symptoms.  Pt states good ability with ADL's, low fall risk, home safety reviewed and adequate, no significant changes in hearing or vision, and occasionally active with exercise.  Walks 2 miles per day.  Incidentally today with poison ivy type rash to both arms with marked itch and scratch despite caladry otc.   Did quit smoking for 1 yr, now back to smoking "occasoinally" Past Medical History  Diagnosis Date  . BACK PAIN 03/14/2009  . COPD 11/01/2006  . GLUCOSE INTOLERANCE 03/14/2009  . Headache 03/14/2009  . HYPERTENSION 11/01/2006  . 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 INSOMNIA, HX OF 01/06/2007  . INSOMNIA-SLEEP DISORDER-UNSPEC 06/05/2009  . Urinary frequency 03/14/2009  . URI 06/05/2009   No past surgical history on file.  reports that she has been smoking.  She does not have any smokeless tobacco history on file. She reports that she does not drink alcohol or use illicit drugs. family history includes Coronary artery disease in her other. Allergies  Allergen Reactions  . Hydrochlorothiazide    Current Outpatient Prescriptions on File Prior to Visit  Medication Sig Dispense Refill  . lisinopril (PRINIVIL,ZESTRIL) 20 MG tablet Take 1 tablet (20 mg total) by mouth daily.  30 tablet  0  . temazepam (RESTORIL) 15 MG  capsule Take 15 mg by mouth at bedtime as needed.        Marland Kitchen aspirin 81 MG tablet Take 81 mg by mouth daily.        Maxwell Caul Bicarbonate (ZEGERID) 20-1100 MG CAPS Take 1 capsule by mouth 2 (two) times daily.         Review of Systems Review of Systems  Constitutional: Negative for diaphoresis, activity change, appetite change and unexpected weight change.  HENT: Negative for hearing loss, ear pain, facial swelling, mouth sores and neck stiffness.   Eyes: Negative for pain, redness and visual disturbance.  Respiratory: Negative for shortness of breath and wheezing.   Cardiovascular: Negative for chest pain and palpitations.  Gastrointestinal: Negative for diarrhea, blood in stool, abdominal distention and rectal pain.  Genitourinary: Negative for hematuria, flank pain and decreased urine volume.  Musculoskeletal: Negative for myalgias and joint swelling.  Skin: Negative for color change and wound.  Neurological: Negative for syncope and numbness.  Hematological: Negative for adenopathy.  Psychiatric/Behavioral: Negative for hallucinations, self-injury, decreased concentration and agitation.      Objective:   Physical Exam BP 142/72  Pulse 84  Temp(Src) 98.2 F (36.8 C) (Oral)  Ht 5\' 4"  (1.626 m)  Wt 160 lb 6 oz (72.746 kg)  BMI 27.53 kg/m2  SpO2 96% Physical Exam  VS noted Constitutional: Pt is oriented to person, place, and time. Appears well-developed and well-nourished.  HENT:  Head: Normocephalic and atraumatic.  Right Ear: External ear normal.  Left Ear: External ear normal.  Nose: Nose normal.  Mouth/Throat: Oropharynx is  clear and moist.  Eyes: Conjunctivae and EOM are normal. Pupils are equal, round, and reactive to light.  Neck: Normal range of motion. Neck supple. No JVD present. No tracheal deviation present.  Cardiovascular: Normal rate, regular rhythm, normal heart sounds and intact distal pulses.   Pulmonary/Chest: Effort normal and breath sounds normal.   Abdominal: Soft. Bowel sounds are normal. There is no tenderness.  Musculoskeletal: Normal range of motion. Exhibits no edema.  Lymphadenopathy:  Has no cervical adenopathy.  Neurological: Pt is alert and oriented to person, place, and time. Pt has normal reflexes. No cranial nerve deficit.  Skin: Skin is warm and dry. No rash noted. except for rather diffuse typical poison ivy type dermatitis to the arms/ post and ant, worse on the left Psychiatric:  Has  normal mood and affect. Behavior is normal.         Assessment & Plan:

## 2010-08-21 NOTE — Assessment & Plan Note (Signed)
Mild, for steroid cream bid prn,  to f/u any worsening symptoms or concerns

## 2010-08-21 NOTE — Patient Instructions (Signed)
Please remember to followup with your GYN for the yearly pap smear and/or mammogram Please go to LAB in the Basement for the blood and/or urine tests to be done today Please call the phone number [phone] (the [employer] System) for results of testing in 2-3 days;  When calling, simply dial the number, and when prompted enter the MRN number above (the Medical Record Number) and the # key, then the message should start. You are given the refills today Take all new medications as prescribed - the cream Please call for prednisone pills if the cream does not seem to work Please return in 1 year for your yearly visit, or sooner if needed, with Lab testing done 3-5 days before

## 2010-08-28 ENCOUNTER — Other Ambulatory Visit (INDEPENDENT_AMBULATORY_CARE_PROVIDER_SITE_OTHER): Payer: BC Managed Care – PPO

## 2010-08-28 DIAGNOSIS — R7309 Other abnormal glucose: Secondary | ICD-10-CM

## 2010-08-28 DIAGNOSIS — Z0000 Encounter for general adult medical examination without abnormal findings: Secondary | ICD-10-CM

## 2010-08-28 DIAGNOSIS — R7302 Impaired glucose tolerance (oral): Secondary | ICD-10-CM

## 2010-08-28 LAB — HEPATIC FUNCTION PANEL
AST: 20 U/L (ref 0–37)
Alkaline Phosphatase: 77 U/L (ref 39–117)
Bilirubin, Direct: 0.1 mg/dL (ref 0.0–0.3)
Total Bilirubin: 0.7 mg/dL (ref 0.3–1.2)

## 2010-08-28 LAB — LIPID PANEL
LDL Cholesterol: 94 mg/dL (ref 0–99)
Total CHOL/HDL Ratio: 3
VLDL: 36.6 mg/dL (ref 0.0–40.0)

## 2010-08-28 LAB — URINALYSIS, ROUTINE W REFLEX MICROSCOPIC
Bilirubin Urine: NEGATIVE
Ketones, ur: NEGATIVE
pH: 7.5 (ref 5.0–8.0)

## 2010-08-28 LAB — CBC WITH DIFFERENTIAL/PLATELET
Eosinophils Relative: 0.7 % (ref 0.0–5.0)
HCT: 37.3 % (ref 36.0–46.0)
Lymphs Abs: 2.5 10*3/uL (ref 0.7–4.0)
MCV: 95 fl (ref 78.0–100.0)
Monocytes Absolute: 0.9 10*3/uL (ref 0.1–1.0)
Platelets: 201 10*3/uL (ref 150.0–400.0)
RDW: 13 % (ref 11.5–14.6)
WBC: 13.9 10*3/uL — ABNORMAL HIGH (ref 4.5–10.5)

## 2010-08-28 LAB — BASIC METABOLIC PANEL
GFR: 69.8 mL/min (ref >60.00)
Potassium: 4.6 mEq/L (ref 3.5–5.1)
Sodium: 137 mEq/L (ref 135–145)

## 2010-08-28 LAB — TSH: TSH: 0.33 u[IU]/mL — ABNORMAL LOW (ref 0.35–5.50)

## 2010-08-28 LAB — HEMOGLOBIN A1C: Hgb A1c MFr Bld: 6.3 % (ref 4.6–6.5)

## 2010-11-10 ENCOUNTER — Other Ambulatory Visit: Payer: Self-pay | Admitting: Obstetrics & Gynecology

## 2010-11-10 DIAGNOSIS — Z1231 Encounter for screening mammogram for malignant neoplasm of breast: Secondary | ICD-10-CM

## 2010-11-10 DIAGNOSIS — Z780 Asymptomatic menopausal state: Secondary | ICD-10-CM

## 2010-11-19 ENCOUNTER — Other Ambulatory Visit (HOSPITAL_COMMUNITY): Payer: BC Managed Care – PPO

## 2010-11-19 ENCOUNTER — Ambulatory Visit (HOSPITAL_COMMUNITY): Payer: BC Managed Care – PPO

## 2010-11-26 ENCOUNTER — Ambulatory Visit
Admission: RE | Admit: 2010-11-26 | Discharge: 2010-11-26 | Disposition: A | Discharge status: Home / Self Care | Payer: BC Managed Care – PPO | Source: Ambulatory Visit | Attending: Obstetrics & Gynecology | Admitting: Obstetrics & Gynecology

## 2010-11-26 DIAGNOSIS — Z1231 Encounter for screening mammogram for malignant neoplasm of breast: Secondary | ICD-10-CM

## 2010-11-26 DIAGNOSIS — Z780 Asymptomatic menopausal state: Secondary | ICD-10-CM

## 2011-02-24 ENCOUNTER — Ambulatory Visit (INDEPENDENT_AMBULATORY_CARE_PROVIDER_SITE_OTHER): Payer: BC Managed Care – PPO | Admitting: Internal Medicine

## 2011-02-24 ENCOUNTER — Encounter: Payer: Self-pay | Admitting: Internal Medicine

## 2011-02-24 VITALS — BP 146/90 | HR 85 | Temp 98.9°F | Ht 66.0 in | Wt 163.2 lb

## 2011-02-24 DIAGNOSIS — I10 Essential (primary) hypertension: Secondary | ICD-10-CM

## 2011-02-24 DIAGNOSIS — J209 Acute bronchitis, unspecified: Secondary | ICD-10-CM

## 2011-02-24 DIAGNOSIS — R7309 Other abnormal glucose: Secondary | ICD-10-CM

## 2011-02-24 DIAGNOSIS — R7302 Impaired glucose tolerance (oral): Secondary | ICD-10-CM

## 2011-02-24 DIAGNOSIS — J441 Chronic obstructive pulmonary disease with (acute) exacerbation: Secondary | ICD-10-CM | POA: Insufficient documentation

## 2011-02-24 MED ORDER — PREDNISONE 10 MG PO TABS: 10.0000 mg | ORAL_TABLET | Freq: Every day | ORAL | Status: AC

## 2011-02-24 MED ORDER — LEVOFLOXACIN 250 MG PO TABS: 250.0000 mg | ORAL_TABLET | Freq: Every day | ORAL | Status: AC

## 2011-02-24 MED ORDER — HYDROCODONE-HOMATROPINE 5-1.5 MG/5ML PO SYRP: 5.0000 mL | ORAL_SOLUTION | Freq: Four times a day (QID) | ORAL | Status: AC | PRN

## 2011-02-24 NOTE — Assessment & Plan Note (Signed)
stable overall by hx and exam, most recent data reviewed with pt, and pt to continue medical treatment as before, likely elev situational, cont to monitor at home and next visit BP Readings from Last 3 Encounters:  02/24/11 146/90  08/21/10 142/72  06/05/09 160/82

## 2011-02-24 NOTE — Assessment & Plan Note (Signed)
stable overall by hx and exam, most recent data reviewed with pt, and pt to continue medical treatment as before, pt to call for onset polys or  cbg > 200

## 2011-02-24 NOTE — Assessment & Plan Note (Signed)
Very mild with wheeze but no signficant dyspnea;  For predpack asd,  to f/u any worsening symptoms or concerns

## 2011-02-24 NOTE — Patient Instructions (Signed)
Take all new medications as prescribed Continue all other medications as before Your right ear was irrigated today of wax impaction Please return in 6 mo with Lab testing done 3-5 days before

## 2011-02-24 NOTE — Assessment & Plan Note (Signed)
Mild to mod, for antibx course,  to f/u any worsening symptoms or concerns 

## 2011-02-24 NOTE — Progress Notes (Signed)
  Subjective:    Patient ID: [name], female    DOB: 21-Feb-1946, 65 y.o.   MRN: [id number]  HPI  Here with acute onset mild to mod 2-3 days ST, HA, general weakness and malaise, with prod cough greenish sputum, but Pt denies chest pain, increased sob or doe, wheezing, orthopnea, PND, increased LE swelling, palpitations, dizziness or syncope, except for mild onset wheezing today, without signficatno sob/doe.  Pt denies new neurological symptoms such as new headache, or facial or extremity weakness or numbness   Pt denies polydipsia, polyuria,   Pt states overall good compliance with meds, trying to follow lower cholesterol, diabetic diet, wt overall stable but little exercise however.   Overall good compliance with treatment, and good medicine tolerability. Past Medical History  Diagnosis Date  . BACK PAIN 03/14/2009  . COPD 11/01/2006  . GLUCOSE INTOLERANCE 03/14/2009  . Headache 03/14/2009  . HYPERTENSION 11/01/2006  . 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 INSOMNIA, HX OF 01/06/2007  . INSOMNIA-SLEEP DISORDER-UNSPEC 06/05/2009  . Urinary frequency 03/14/2009  . URI 06/05/2009   No past surgical history on file.  reports that she has been smoking.  She does not have any smokeless tobacco history on file. She reports that she does not drink alcohol or use illicit drugs. family history includes Coronary artery disease in her other. Allergies  Allergen Reactions  . Hydrochlorothiazide    Current Outpatient Prescriptions on File Prior to Visit  Medication Sig Dispense Refill  . lisinopril (PRINIVIL,ZESTRIL) 20 MG tablet Take 1 tablet (20 mg total) by mouth daily.  90 tablet  3  . temazepam (RESTORIL) 15 MG capsule Take 2 capsules (30 mg total) by mouth at bedtime as needed.  60 capsule  5  . aspirin 81 MG tablet Take 81 mg by mouth daily.         Review of Systems Review of Systems  Constitutional: Negative for diaphoresis and unexpected weight change.  HENT: Negative for drooling  and tinnitus.   Eyes: Negative for photophobia and visual disturbance.  Respiratory: Negative for choking and stridor.   Gastrointestinal: Negative for vomiting and blood in stool.  Genitourinary: Negative for hematuria and decreased urine volume.      Objective:   Physical Exam BP 146/90  Pulse 85  Temp(Src) 98.9 F (37.2 C) (Oral)  Ht 5\' 6"  (1.676 m)  Wt 163 lb 4 oz (74.05 kg)  BMI 26.35 kg/m2  SpO2 98% Physical Exam  VS noted, mild ill Constitutional: Pt appears well-developed and well-nourished.  HENT: Head: Normocephalic.  Right Ear: External ear normal.  Left Ear: External ear normal.  Bilat tm's mild erythema.  Sinus nontender.  Pharynx mild erythema Eyes: Conjunctivae and EOM are normal. Pupils are equal, round, and reactive to light.  Neck: Normal range of motion. Neck supple.  Cardiovascular: Normal rate and regular rhythm.   Pulmonary/Chest: Effort normal and breath sounds decreased bilat with mild wheeze.  Neurological: Pt is alert. No cranial nerve deficit.  Skin: Skin is warm. No erythema.  Psychiatric: Pt behavior is normal. Thought content normal.     Assessment & Plan:

## 2011-05-21 ENCOUNTER — Other Ambulatory Visit: Payer: Self-pay | Admitting: Internal Medicine

## 2011-05-21 NOTE — Telephone Encounter (Signed)
Faxed hardcopy to pharmacy. 

## 2011-05-21 NOTE — Telephone Encounter (Signed)
Done hardcopy to [facility]  

## 2011-05-25 ENCOUNTER — Other Ambulatory Visit: Payer: Self-pay

## 2011-08-12 ENCOUNTER — Other Ambulatory Visit: Payer: Self-pay | Admitting: Internal Medicine

## 2011-12-31 ENCOUNTER — Other Ambulatory Visit: Payer: Self-pay | Admitting: Obstetrics & Gynecology

## 2011-12-31 DIAGNOSIS — Z1231 Encounter for screening mammogram for malignant neoplasm of breast: Secondary | ICD-10-CM

## 2012-01-11 ENCOUNTER — Other Ambulatory Visit: Payer: Self-pay | Admitting: Internal Medicine

## 2012-01-11 NOTE — Telephone Encounter (Signed)
Faxed hardcopy to [facility] 

## 2012-01-11 NOTE — Telephone Encounter (Signed)
Done hardcopy to [facility]  

## 2012-02-04 ENCOUNTER — Ambulatory Visit
Admission: RE | Admit: 2012-02-04 | Discharge: 2012-02-04 | Disposition: A | Discharge status: Home / Self Care | Payer: BC Managed Care – PPO | Source: Ambulatory Visit | Attending: Obstetrics & Gynecology | Admitting: Obstetrics & Gynecology

## 2012-02-04 DIAGNOSIS — Z1231 Encounter for screening mammogram for malignant neoplasm of breast: Secondary | ICD-10-CM

## 2012-04-26 ENCOUNTER — Encounter: Payer: Self-pay | Admitting: Internal Medicine

## 2012-05-16 ENCOUNTER — Other Ambulatory Visit: Payer: Self-pay | Admitting: Internal Medicine

## 2012-05-25 ENCOUNTER — Encounter: Payer: Self-pay | Admitting: Internal Medicine

## 2012-05-30 ENCOUNTER — Other Ambulatory Visit (INDEPENDENT_AMBULATORY_CARE_PROVIDER_SITE_OTHER): Payer: Medicare Other

## 2012-05-30 ENCOUNTER — Encounter: Payer: Self-pay | Admitting: Internal Medicine

## 2012-05-30 ENCOUNTER — Ambulatory Visit (INDEPENDENT_AMBULATORY_CARE_PROVIDER_SITE_OTHER): Payer: Medicare Other | Admitting: Internal Medicine

## 2012-05-30 VITALS — BP 148/92 | HR 84 | Temp 98.1°F | Ht 66.0 in | Wt 166.4 lb

## 2012-05-30 DIAGNOSIS — J209 Acute bronchitis, unspecified: Secondary | ICD-10-CM

## 2012-05-30 DIAGNOSIS — Z0000 Encounter for general adult medical examination without abnormal findings: Secondary | ICD-10-CM

## 2012-05-30 DIAGNOSIS — Z23 Encounter for immunization: Secondary | ICD-10-CM

## 2012-05-30 DIAGNOSIS — R7302 Impaired glucose tolerance (oral): Secondary | ICD-10-CM

## 2012-05-30 DIAGNOSIS — I10 Essential (primary) hypertension: Secondary | ICD-10-CM

## 2012-05-30 DIAGNOSIS — R7309 Other abnormal glucose: Secondary | ICD-10-CM

## 2012-05-30 DIAGNOSIS — G4700 Insomnia, unspecified: Secondary | ICD-10-CM

## 2012-05-30 LAB — BASIC METABOLIC PANEL
CO2: 30 mEq/L (ref 19–32)
Chloride: 101 mEq/L (ref 96–112)
Glucose, Bld: 100 mg/dL — ABNORMAL HIGH (ref 70–99)
Potassium: 4.5 mEq/L (ref 3.5–5.1)
Sodium: 136 mEq/L (ref 135–145)

## 2012-05-30 LAB — CBC WITH DIFFERENTIAL/PLATELET
Basophils Absolute: 0.1 10*3/uL (ref 0.0–0.1)
Basophils Relative: 0.4 % (ref 0.0–3.0)
Eosinophils Relative: 2 % (ref 0.0–5.0)
Hemoglobin: 14.3 g/dL (ref 12.0–15.0)
Lymphocytes Relative: 20.1 % (ref 12.0–46.0)
Monocytes Relative: 7.9 % (ref 3.0–12.0)
Neutro Abs: 8.7 10*3/uL — ABNORMAL HIGH (ref 1.4–7.7)
RBC: 4.54 Mil/uL (ref 3.87–5.11)

## 2012-05-30 LAB — URINALYSIS, ROUTINE W REFLEX MICROSCOPIC
Specific Gravity, Urine: <=1.005 (ref 1.000–1.030)
Total Protein, Urine: NEGATIVE
Urine Glucose: NEGATIVE
Urobilinogen, UA: 0.2 (ref 0.0–1.0)
pH: 6 (ref 5.0–8.0)

## 2012-05-30 LAB — HEMOGLOBIN A1C: Hgb A1c MFr Bld: 6.2 % (ref 4.6–6.5)

## 2012-05-30 LAB — HEPATIC FUNCTION PANEL
ALT: 15 U/L (ref 0–35)
AST: 18 U/L (ref 0–37)
Albumin: 3.9 g/dL (ref 3.5–5.2)
Alkaline Phosphatase: 84 U/L (ref 39–117)
Total Protein: 7.5 g/dL (ref 6.0–8.3)

## 2012-05-30 LAB — LIPID PANEL: HDL: 50.6 mg/dL (ref >39.00)

## 2012-05-30 MED ORDER — AMLODIPINE BESYLATE 5 MG PO TABS: 5.0000 mg | ORAL_TABLET | Freq: Every day | ORAL | Status: DC

## 2012-05-30 MED ORDER — AZITHROMYCIN 250 MG PO TABS: ORAL_TABLET | ORAL | Status: DC

## 2012-05-30 MED ORDER — CLONAZEPAM 0.5 MG PO TABS: 0.5000 mg | ORAL_TABLET | Freq: Every evening | ORAL | Status: DC | PRN

## 2012-05-30 MED ORDER — LISINOPRIL 20 MG PO TABS: ORAL_TABLET | ORAL | Status: DC

## 2012-05-30 NOTE — Assessment & Plan Note (Signed)
Ok to try change to klonopin qhs prn,  to f/u any worsening symptoms or concerns

## 2012-05-30 NOTE — Progress Notes (Signed)
Subjective:    Patient ID: Leah Mata, female    DOB: May 15, 1945, 67 y.o.   MRN: [id number]  HPI  Here for wellness and f/u;  Overall doing ok;  Pt denies CP, worsening SOB, DOE, wheezing, orthopnea, PND, worsening LE edema, palpitations, dizziness or syncope.  Pt denies neurological change such as new headache, facial or extremity weakness.  Pt denies polydipsia, polyuria, or low sugar symptoms. Pt states overall good compliance with treatment and medications, good tolerability, and has been trying to follow lower cholesterol diet.  Pt denies worsening depressive symptoms, suicidal ideation or panic. No fever, night sweats, wt loss, loss of appetite, or other constitutional symptoms.  Pt states good ability with ADL's, has low fall risk, home safety reviewed and adequate, no other significant changes in hearing or vision, and only occasionally active with exercise.  Has colonoscopy scheduled for next month.  Restoril now too expensive, needs to change for sleep.  Incidentally -  Here with 2-3 days acute onset fever, marked ST,  pressure, headache, general weakness and malaise. Past Medical History  Diagnosis Date  . BACK PAIN 03/14/2009  . COPD 11/01/2006  . GLUCOSE INTOLERANCE 03/14/2009  . Headache 03/14/2009  . HYPERTENSION 11/01/2006  . 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 INSOMNIA, HX OF 01/06/2007  . INSOMNIA-SLEEP DISORDER-UNSPEC 06/05/2009  . Urinary frequency 03/14/2009  . URI 06/05/2009   No past surgical history on file.  reports that she has been smoking.  She does not have any smokeless tobacco history on file. She reports that she does not drink alcohol or use illicit drugs. family history includes Coronary artery disease in her other. Allergies  Allergen Reactions  . Hydrochlorothiazide    Current Outpatient Prescriptions on File Prior to Visit  Medication Sig Dispense Refill  . aspirin 81 MG tablet Take 81 mg by mouth daily.         No current facility-administered  medications on file prior to visit.   Review of Systems Constitutional: Negative for diaphoresis, activity change, appetite change or unexpected weight change.  HENT: Negative for hearing loss, ear pain, facial swelling, mouth sores and neck stiffness.   Eyes: Negative for pain, redness and visual disturbance.  Respiratory: Negative for shortness of breath and wheezing.   Cardiovascular: Negative for chest pain and palpitations.  Gastrointestinal: Negative for diarrhea, blood in stool, abdominal distention or other pain Genitourinary: Negative for hematuria, flank pain or change in urine volume.  Musculoskeletal: Negative for myalgias and joint swelling.  Skin: Negative for color change and wound.  Neurological: Negative for syncope and numbness. other than noted Hematological: Negative for adenopathy.  Psychiatric/Behavioral: Negative for hallucinations, self-injury, decreased concentration and agitation.      Objective:   Physical Exam BP 148/92  Pulse 84  Temp(Src) 98.1 F (36.7 C) (Oral)  Ht 5\' 6"  (1.676 m)  Wt 166 lb 6.4 oz (75.479 kg)  BMI 26.87 kg/m2  SpO2 95% VS noted, mild ill Constitutional: Pt is oriented to person, place, and time. Appears well-developed and well-nourished.  Head: Normocephalic and atraumatic.  Right Ear: External ear normal.  Left Ear: External ear normal.  Nose: Nose normal.  Mouth/Throat:  Pharynx with mod erythema and uvula swelling, no exudate Eyes: Conjunctivae and EOM are normal. Pupils are equal, round, and reactive to light.  Neck: Normal range of motion. Neck supple. No JVD present. No tracheal deviation present.  Cardiovascular: Normal rate, regular rhythm, normal heart sounds and intact distal pulses.   Pulmonary/Chest: Effort  normal and breath sounds normal.  Abdominal: Soft. Bowel sounds are normal. There is no tenderness. No HSM  Musculoskeletal: Normal range of motion. Exhibits no edema.  Lymphadenopathy:  Has no cervical  adenopathy.  Neurological: Pt is alert and oriented to person, place, and time. Pt has normal reflexes. No cranial nerve deficit.  Skin: Skin is warm and dry. No rash noted.  Psychiatric:  Has  normal mood and affect. Behavior is normal.     Assessment & Plan:

## 2012-05-30 NOTE — Assessment & Plan Note (Signed)
Mild increased persistent. O/w stable overall by history and exam, recent data reviewed with pt, and pt to continue medical treatment as before except add amlod 5 mg,  to f/u any worsening symptoms or concerns BP Readings from Last 3 Encounters:  05/30/12 148/92  02/24/11 146/90  08/21/10 142/72

## 2012-05-30 NOTE — Assessment & Plan Note (Signed)

## 2012-05-30 NOTE — Assessment & Plan Note (Signed)
Mild to mod, for antibx course,  to f/u any worsening symptoms or concerns 

## 2012-05-30 NOTE — Assessment & Plan Note (Signed)
stable overall by history and exam, recent data reviewed with pt, and pt to continue medical treatment as before,  to f/u any worsening symptoms or concerns Lab Results  Component Value Date   HGBA1C 6.2 05/30/2012    

## 2012-05-30 NOTE — Patient Instructions (Addendum)
You had the pneumonia shot today Please take all new medication as prescribed  - the antibiotic, and the new blood pressure medication, as well as the sleep pill Please call if the sleep pill does not work, to consider change back to the temazepam 15 mg Please continue all other medications as before, and refills have been done if requested (all were sent today) Please have the pharmacy call with any other refills you may need. Please go to the LAB in the Basement (turn left off the elevator) for the tests to be done today You will be contacted by phone if any changes need to be made immediately.  Otherwise, you will receive a letter about your results with an explanation Please remember to sign up for My Chart if you have not done so, as this will be important to you in the future with finding out test results, communicating by private email, and scheduling acute appointments online when needed. Please continue your efforts at being more active, low cholesterol diet, and weight control. Please keep your appointments with your specialists as you have planned - the colonoscopy in April Please remember to followup with your GYN for the yearly pap smear and/or mammogram as you do You are otherwise up to date with prevention measures today. Please return in 6 months, or sooner if needed

## 2012-06-14 ENCOUNTER — Ambulatory Visit (AMBULATORY_SURGERY_CENTER): Payer: Medicare Other

## 2012-06-14 ENCOUNTER — Encounter: Payer: Self-pay | Admitting: Internal Medicine

## 2012-06-14 VITALS — Ht 66.0 in | Wt 167.4 lb

## 2012-06-14 DIAGNOSIS — Z860100 Personal history of colon polyps, unspecified: Secondary | ICD-10-CM

## 2012-06-14 DIAGNOSIS — Z1211 Encounter for screening for malignant neoplasm of colon: Secondary | ICD-10-CM

## 2012-06-14 MED ORDER — MOVIPREP 100 G PO SOLR: 1.0000 | Freq: Once | ORAL | Status: DC

## 2012-06-30 ENCOUNTER — Encounter: Payer: BC Managed Care – PPO | Admitting: Internal Medicine

## 2012-07-07 ENCOUNTER — Encounter: Payer: Self-pay | Admitting: Internal Medicine

## 2012-07-07 ENCOUNTER — Ambulatory Visit (AMBULATORY_SURGERY_CENTER): Payer: Medicare Other | Admitting: Internal Medicine

## 2012-07-07 VITALS — BP 121/68 | HR 72 | Temp 97.2°F | Resp 22 | Ht 66.0 in | Wt 167.0 lb

## 2012-07-07 DIAGNOSIS — Z1211 Encounter for screening for malignant neoplasm of colon: Secondary | ICD-10-CM

## 2012-07-07 DIAGNOSIS — Z860100 Personal history of colon polyps, unspecified: Secondary | ICD-10-CM

## 2012-07-07 MED ORDER — SODIUM CHLORIDE 0.9 % IV SOLN: 500.0000 mL | INTRAVENOUS | Status: DC

## 2012-07-07 NOTE — Patient Instructions (Addendum)
Discharge instructions given with verbal understanding. Handouts on hemorrhoids and a high fiber diet given. Resume previous medications. YOU HAD AN ENDOSCOPIC PROCEDURE TODAY AT THE [facility]: Refer to the procedure report that was given to you for any specific questions about what was found during the examination.  If the procedure report does not answer your questions, please call your gastroenterologist to clarify.  If you requested that your care partner not be given the details of your procedure findings, then the procedure report has been included in a sealed envelope for you to review at your convenience later.  YOU SHOULD EXPECT: Some feelings of bloating in the abdomen. Passage of more gas than usual.  Walking can help get rid of the air that was put into your GI tract during the procedure and reduce the bloating. If you had a lower endoscopy (such as a colonoscopy or flexible sigmoidoscopy) you may notice spotting of blood in your stool or on the toilet paper. If you underwent a bowel prep for your procedure, then you may not have a normal bowel movement for a few days.  DIET: Your first meal following the procedure should be a light meal and then it is ok to progress to your normal diet.  A half-sandwich or bowl of soup is an example of a good first meal.  Heavy or fried foods are harder to digest and may make you feel nauseous or bloated.  Likewise meals heavy in dairy and vegetables can cause extra gas to form and this can also increase the bloating.  Drink plenty of fluids but you should avoid alcoholic beverages for 24 hours.  ACTIVITY: Your care partner should take you home directly after the procedure.  You should plan to take it easy, moving slowly for the rest of the day.  You can resume normal activity the day after the procedure however you should NOT DRIVE or use heavy machinery for 24 hours (because of the sedation medicines used during the test).    SYMPTOMS TO  REPORT IMMEDIATELY: A gastroenterologist can be reached at any hour.  During normal business hours, 8:30 AM to 5:00 PM Monday through Friday, call [phone].  After hours and on weekends, please call the GI answering service at [phone] who will take a message and have the physician on call contact you.   Following lower endoscopy (colonoscopy or flexible sigmoidoscopy):  Excessive amounts of blood in the stool  Significant tenderness or worsening of abdominal pains  Swelling of the abdomen that is new, acute  Fever of 100F or higher FOLLOW UP: If any biopsies were taken you will be contacted by phone or by letter within the next 1-3 weeks.  Call your gastroenterologist if you have not heard about the biopsies in 3 weeks.  Our staff will call the home number listed on your records the next business day following your procedure to check on you and address any questions or concerns that you may have at that time regarding the information given to you following your procedure. This is a courtesy call and so if there is no answer at the home number and we have not heard from you through the emergency physician on call, we will assume that you have returned to your regular daily activities without incident.  SIGNATURES/CONFIDENTIALITY: You and/or your care partner have signed paperwork which will be entered into your electronic medical record.  These signatures attest to the fact that that the information above on your   After Visit Summary has been reviewed and is understood.  Full responsibility of the confidentiality of this discharge information lies with you and/or your care-partner.  

## 2012-07-07 NOTE — Op Note (Signed)
[facility] [address] [place], [address]   COLONOSCOPY PROCEDURE REPORT  PATIENT: [name], [name]  MR#: [id number] BIRTHDATE: May 28, 1945 , 67  yrs. old GENDER: Female ENDOSCOPIST: Hart Carwin, MD REFERRED BY:  recall colonoscopy PROCEDURE DATE:  07/07/2012 PROCEDURE:   Colonoscopy, screening ASA CLASS:   Class II INDICATIONS:Patient's personal history of adenomatous colon polyps.  MEDICATIONS: MAC sedation, administered by CRNA and Propofol (Diprivan) 180 mg IV  DESCRIPTION OF PROCEDURE:   After the risks and benefits and of the procedure were explained, informed consent was obtained.  A digital rectal exam revealed no abnormalities of the rectum.    The LB [id number]  endoscope was introduced through the anus and advanced to the cecum, which was identified by both the appendix and ileocecal valve .  The quality of the prep was good, using MoviPrep .  The instrument was then slowly withdrawn as the colon was fully examined.     COLON FINDINGS: A normal appearing cecum, ileocecal valve, and appendiceal orifice were identified.  The ascending, hepatic flexure, transverse, splenic flexure, descending, sigmoid colon and rectum appeared unremarkable.  No polyps or cancers were seen. Small internal hemorrhoids were found.     Retroflexed views revealed no abnormalities.     The scope was then withdrawn from the patient and the procedure completed.  COMPLICATIONS: There were no complications. ENDOSCOPIC IMPRESSION: Normal colon internal hemorrhoids  RECOMMENDATIONS: High fiber diet   REPEAT EXAM: for Colonoscopy. in 10 years  cc:  _______________________________ eSigned:  Hart Carwin, MD 07/07/2012 8:59 AM     PATIENT NAME:  Leah Mata, Leah Mata MR#: [id number]

## 2012-07-07 NOTE — Progress Notes (Signed)
Patient did not experience any of the following events: a burn prior to discharge; a fall within the facility; wrong site/side/patient/procedure/implant event; or a hospital transfer or hospital admission upon discharge from the facility. (G8907) Patient did not have preoperative order for IV antibiotic SSI prophylaxis. (G8918)  

## 2012-07-10 ENCOUNTER — Telehealth: Payer: Self-pay | Admitting: *Deleted

## 2012-07-10 NOTE — Telephone Encounter (Signed)
  Follow up Call-  Call back number 07/07/2012  Post procedure Call Back phone  # [phone]  Permission to leave phone message Yes     Patient questions:  Do you have a fever, pain , or abdominal swelling? no Pain Score  0 *  Have you tolerated food without any problems? yes  Have you been able to return to your normal activities? yes  Do you have any questions about your discharge instructions: Diet   no Medications  no Follow up visit  no  Do you have questions or concerns about your Care? no  Actions: * If pain score is 4 or above: No action needed, pain <4.

## 2012-11-09 ENCOUNTER — Encounter: Payer: Self-pay | Admitting: Obstetrics & Gynecology

## 2012-11-09 ENCOUNTER — Ambulatory Visit (INDEPENDENT_AMBULATORY_CARE_PROVIDER_SITE_OTHER): Payer: MEDICARE | Admitting: Obstetrics & Gynecology

## 2012-11-09 VITALS — BP 128/82 | HR 80 | Resp 20 | Ht 63.25 in | Wt 166.0 lb

## 2012-11-09 DIAGNOSIS — Z01419 Encounter for gynecological examination (general) (routine) without abnormal findings: Secondary | ICD-10-CM

## 2012-11-09 DIAGNOSIS — N766 Ulceration of vulva: Secondary | ICD-10-CM

## 2012-11-09 NOTE — Patient Instructions (Addendum)

## 2012-11-09 NOTE — Progress Notes (Signed)
67 y.o. G2P2 WidowedCaucasianF here for annual exam.  No vaginal bleeding.  Went to [place] this summer.  Granddaughter had a baby last year on the day she was here for her visit.  Asks me to look at a place on her buttocks that has been "oozy" off and on for several weeks  Patient's last menstrual period was 03/29/1993.          Sexually active: no  The current method of family planning is post menopausal status.    Exercising: yes  walking Smoker:  yes  Health Maintenance: Pap:  11/04/11 WNL/negative HR HPV History of abnormal Pap:  yes MMG:  02/04/12 normal Colonoscopy:  2014 repeat in 10 years, negative BMD:   11/26/10 -1.3/-1.7 TDaP:  2007 Screening Labs: PCP, Hb today: PCP, Urine today: PCP   reports that she has been smoking.  She has never used smokeless tobacco. She reports that she does not drink alcohol or use illicit drugs.  Past Medical History  Diagnosis Date  . BACK PAIN 03/14/2009  . COPD 11/01/2006  . GLUCOSE INTOLERANCE 03/14/2009  . Headache(784.0) 03/14/2009  . HYPERTENSION 11/01/2006  . 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 INSOMNIA, HX OF 01/06/2007  . INSOMNIA-SLEEP DISORDER-UNSPEC 06/05/2009  . Urinary frequency 03/14/2009  . URI 06/05/2009    Past Surgical History  Procedure Laterality Date  . Tubal ligation    . Tonsillectomy    . Dilation and curettage of uterus    . Cardiac catheterization  2003    negative  . Colonoscopy      Current Outpatient Prescriptions  Medication Sig Dispense Refill  . amLODipine (NORVASC) 5 MG tablet Take 1 tablet (5 mg total) by mouth daily.  90 tablet  3  . aspirin 81 MG tablet Take 81 mg by mouth daily.        Marland Kitchen lisinopril (PRINIVIL,ZESTRIL) 20 MG tablet TAKE 1 TABLET BY MOUTH DAILY  90 tablet  3   No current facility-administered medications for this visit.    Family History  Problem Relation Age of Onset  . Coronary artery disease Other     female 1st degree relative <60 and female 1st degree relative <50  .  Colon cancer Neg Hx   . Diabetes Father   . Diabetes Mother   . Hypertension Mother   . Congestive Heart Failure Mother   . Heart attack Father     ROS:  Pertinent items are noted in HPI.  Otherwise, a comprehensive ROS was negative.  Exam:   BP 128/82  Pulse 80  Resp 20  Ht 5' 3.25" (1.607 m)  Wt 166 lb (75.297 kg)  BMI 29.16 kg/m2  LMP 03/29/1993  Weight change: +4lbs   Height: 5' 3.25" (160.7 cm)  Ht Readings from Last 3 Encounters:  11/09/12 5' 3.25" (1.607 m)  07/07/12 5\' 6"  (1.676 m)  06/14/12 5\' 6"  (1.676 m)    General appearance: alert, cooperative and appears stated age Head: Normocephalic, without obvious abnormality, atraumatic Neck: no adenopathy, supple, symmetrical, trachea midline and thyroid normal to inspection and palpation Lungs: clear to auscultation bilaterally Breasts: normal appearance, no masses or tenderness Heart: regular rate and rhythm Abdomen: soft, non-tender; bowel sounds normal; no masses,  no organomegaly Extremities: extremities normal, atraumatic, no cyanosis or edema Skin: Skin color, texture, turgor normal. No rashes or lesions Lymph nodes: Cervical, supraclavicular, and axillary nodes normal. No abnormal inguinal nodes palpated Neurologic: Grossly normal   Pelvic: External genitalia:  To right on upper  side of gluteal cleft is flat, erythematous lesion              Urethra:  normal appearing urethra with no masses, tenderness or lesions              Bartholins and Skenes: normal                 Vagina: normal appearing vagina with normal color and discharge, no lesions              Cervix: no lesions              Pap taken: no Bimanual Exam:  Uterus:  normal size, contour, position, consistency, mobility, non-tender              Adnexa: normal adnexa and no mass, fullness, tenderness               Rectovaginal: Confirms               Anus:  normal sphincter tone, no lesions  HSV culture obtained.  Biopsy of lesion recommended.   Area cleansed with Betadine x 3.  1% Lidocaine plain instilled.  0.4cc used.  4mm punch biopsy obtained.  With trying to excise biopsy, tissue kept ripping/pulling apart.  Second punch obtained with same issue.  Pieces of tissue sent together in specimen vial.  Silver nitrate applied for hemostasis.  Patient tolerated procedure well.  Dressing applied.    A:  Well Woman with normal exam PMP, No HRT Recurrent vulvar furuncles Osteopenia Erythematous lesion in right upper gluteal cleft  P:   Mammogram yearly pap smear with neg HR HPV last year return annually or prn  An After Visit Summary was printed and given to the patient.

## 2012-11-13 LAB — HERPES SIMPLEX VIRUS CULTURE: Organism ID, Bacteria: NOT DETECTED

## 2012-11-15 ENCOUNTER — Telehealth: Payer: Self-pay | Admitting: Obstetrics & Gynecology

## 2012-11-15 NOTE — Telephone Encounter (Signed)
It's already signed off and I sent it to [place].  As she nor Kennon Rounds are in the office, that call will be made tomorrow.  You can see I signed it off if you look at the results.

## 2012-11-15 NOTE — Telephone Encounter (Signed)
Patient is calling to inquire about receipt of biopsy results from visit on 11/09/12.

## 2012-11-16 NOTE — Telephone Encounter (Signed)
Patient aware of results.

## 2012-12-01 ENCOUNTER — Encounter: Payer: Self-pay | Admitting: Internal Medicine

## 2012-12-01 ENCOUNTER — Ambulatory Visit (INDEPENDENT_AMBULATORY_CARE_PROVIDER_SITE_OTHER): Payer: Medicare Other | Admitting: Internal Medicine

## 2012-12-01 VITALS — BP 132/80 | HR 93 | Temp 98.5°F | Wt 166.0 lb

## 2012-12-01 DIAGNOSIS — J449 Chronic obstructive pulmonary disease, unspecified: Secondary | ICD-10-CM

## 2012-12-01 DIAGNOSIS — Z23 Encounter for immunization: Secondary | ICD-10-CM

## 2012-12-01 DIAGNOSIS — I10 Essential (primary) hypertension: Secondary | ICD-10-CM

## 2012-12-01 DIAGNOSIS — G4700 Insomnia, unspecified: Secondary | ICD-10-CM

## 2012-12-01 DIAGNOSIS — R7302 Impaired glucose tolerance (oral): Secondary | ICD-10-CM

## 2012-12-01 DIAGNOSIS — F17200 Nicotine dependence, unspecified, uncomplicated: Secondary | ICD-10-CM | POA: Insufficient documentation

## 2012-12-01 DIAGNOSIS — R7309 Other abnormal glucose: Secondary | ICD-10-CM

## 2012-12-01 MED ORDER — AMLODIPINE BESYLATE 5 MG PO TABS: 5.0000 mg | ORAL_TABLET | Freq: Every day | ORAL | Status: DC

## 2012-12-01 MED ORDER — TEMAZEPAM 15 MG PO CAPS: ORAL_CAPSULE | ORAL | Status: DC

## 2012-12-01 MED ORDER — LISINOPRIL 20 MG PO TABS: ORAL_TABLET | ORAL | Status: DC

## 2012-12-01 NOTE — Assessment & Plan Note (Signed)
Ok to change back to [employer] prn, Done hardcopy to pt

## 2012-12-01 NOTE — Assessment & Plan Note (Signed)
stable overall by history and exam, recent data reviewed with pt, and pt to continue medical treatment as before,  to f/u any worsening symptoms or concerns SpO2 Readings from Last 3 Encounters:  12/01/12 98%  07/07/12 98%  05/30/12 95%

## 2012-12-01 NOTE — Assessment & Plan Note (Signed)
stable overall by history and exam, recent data reviewed with pt, and pt to continue medical treatment as before,  to f/u any worsening symptoms or concerns BP Readings from Last 3 Encounters:  12/01/12 132/80  11/09/12 128/82  07/07/12 121/68

## 2012-12-01 NOTE — Patient Instructions (Addendum)
You had the flu shot today Please stop smoking Please continue all other medications as before, and refills have been done if requested. Please have the pharmacy call with any other refills you may need. No blood work needed today  Please remember to sign up for My Chart if you have not done so, as this will be important to you in the future with finding out test results, communicating by private email, and scheduling acute appointments online when needed.  Please return in 6 months, or sooner if needed, with Lab testing done 3-5 days before

## 2012-12-01 NOTE — Assessment & Plan Note (Signed)
stable overall by history and exam, recent data reviewed with pt, and pt to continue medical treatment as before,  to f/u any worsening symptoms or concerns Lab Results  Component Value Date   HGBA1C 6.2 05/30/2012    

## 2012-12-01 NOTE — Progress Notes (Signed)
  Subjective:    Patient ID: Leah Mata, female    DOB: Oct 01, 1945, 67 y.o.   MRN: [id number]  HPI  Here to f/u; overall doing ok,  Pt denies chest pain, increased sob or doe, wheezing, orthopnea, PND, increased LE swelling, palpitations, dizziness or syncope.  Pt denies polydipsia, polyuria, or low sugar symptoms such as weakness or confusion improved with po intake.  Pt denies new neurological symptoms such as new headache, or facial or extremity weakness or numbness.   Pt states overall good compliance with meds, has been trying to follow lower cholesterol diet, with wt overall stable,  but little exercise however.  Asks to change back to temazepam b/c this helped better with sleep, though she has to pay out of pocket. Still smoking  Due for flu shot Past Medical History  Diagnosis Date  . BACK PAIN 03/14/2009  . COPD 11/01/2006  . GLUCOSE INTOLERANCE 03/14/2009  . Headache(784.0) 03/14/2009  . HYPERTENSION 11/01/2006  . 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 INSOMNIA, HX OF 01/06/2007  . Urinary frequency 03/14/2009   Past Surgical History  Procedure Laterality Date  . Tubal ligation    . Tonsillectomy    . Dilation and curettage of uterus    . Cardiac catheterization  2003    negative    reports that she has been smoking.  She has never used smokeless tobacco. She reports that she does not drink alcohol or use illicit drugs. family history includes Congestive Heart Failure in her mother; Coronary artery disease in her other; Diabetes in her father and mother; Heart attack in her father; Hypertension in her mother. There is no history of Colon cancer. Allergies  Allergen Reactions  . Hydrochlorothiazide    Current Outpatient Prescriptions on File Prior to Visit  Medication Sig Dispense Refill  . aspirin 81 MG tablet Take 81 mg by mouth daily.         No current facility-administered medications on file prior to visit.    Review of Systems  Constitutional: Negative for  unexpected weight change, or unusual diaphoresis  HENT: Negative for tinnitus.   Eyes: Negative for photophobia and visual disturbance.  Respiratory: Negative for choking and stridor.   Gastrointestinal: Negative for vomiting and blood in stool.  Genitourinary: Negative for hematuria and decreased urine volume.  Musculoskeletal: Negative for acute joint swelling Skin: Negative for color change and wound.  Neurological: Negative for tremors and numbness other than noted  Psychiatric/Behavioral: Negative for decreased concentration or  hyperactivity.       Objective:   Physical Exam BP 132/80  Pulse 93  Temp(Src) 98.5 F (36.9 C) (Oral)  Wt 166 lb (75.297 kg)  BMI 29.16 kg/m2  SpO2 98%  LMP 03/29/1993 VS noted,  Constitutional: Pt appears well-developed and well-nourished.  HENT: Head: NCAT.  Right Ear: External ear normal.  Left Ear: External ear normal.  Eyes: Conjunctivae and EOM are normal. Pupils are equal, round, and reactive to light.  Neck: Normal range of motion. Neck supple.  Cardiovascular: Normal rate and regular rhythm.   Pulmonary/Chest: Effort normal and breath sounds normal.  Abd:  Soft, NT, non-distended, + BS Neurological: Pt is alert. Not confused  Skin: Skin is warm. No erythema.  Psychiatric: Pt behavior is normal. Thought content normal.      Assessment & Plan:

## 2012-12-01 NOTE — Assessment & Plan Note (Signed)
D/w pt, delcines chantix, but for OTC patch for now to quit smoking, watch for wt gain

## 2012-12-26 ENCOUNTER — Telehealth: Payer: Self-pay | Admitting: Internal Medicine

## 2012-12-26 NOTE — Telephone Encounter (Signed)
Recd records from [facility] Forwarding 3pgs to Dr.John  °

## 2013-02-26 ENCOUNTER — Telehealth: Payer: Self-pay | Admitting: Obstetrics & Gynecology

## 2013-02-26 NOTE — Telephone Encounter (Signed)
Patient calling with R breast cyst. States it has been on breast and now is causing pain. No redness or streaking of skin. No fevers. OV scheduled with Dr. Hyacinth Meeker for 02/27/13 at 4 PM.

## 2013-02-26 NOTE — Telephone Encounter (Signed)
Pt has a boil or cyst on her right breat. Pt states it has been there for a month and has just started to hurt

## 2013-02-27 ENCOUNTER — Ambulatory Visit (INDEPENDENT_AMBULATORY_CARE_PROVIDER_SITE_OTHER): Payer: MEDICARE | Admitting: Obstetrics & Gynecology

## 2013-02-27 VITALS — BP 118/62 | HR 68 | Resp 20 | Ht 63.25 in | Wt 163.2 lb

## 2013-02-27 DIAGNOSIS — N61 Mastitis without abscess: Secondary | ICD-10-CM

## 2013-02-27 DIAGNOSIS — N611 Abscess of the breast and nipple: Secondary | ICD-10-CM

## 2013-02-27 MED ORDER — DICLOXACILLIN SODIUM 500 MG PO CAPS: 500.0000 mg | ORAL_CAPSULE | Freq: Four times a day (QID) | ORAL | Status: DC

## 2013-02-27 MED ORDER — HYDROCODONE-ACETAMINOPHEN 5-300 MG PO TABS: 1.0000 | ORAL_TABLET | Freq: Four times a day (QID) | ORAL | Status: DC | PRN

## 2013-02-28 ENCOUNTER — Encounter: Payer: Self-pay | Admitting: Obstetrics & Gynecology

## 2013-02-28 NOTE — Patient Instructions (Signed)
Please call with any fevers or new concerns.

## 2013-02-28 NOTE — Progress Notes (Signed)
Subjective:     Patient ID: Leah Mata, female   DOB: 1945-09-03, 67 y.o.   MRN: [id number]  HPI 67 yo G2P2 WWF here with one month complaint of tender area on right breast that over the last two days has become much larger with increased tenderness.  Looks like a boil to her.  Cannot wear a bra due to tenderness.  No recent trauma, injury or other issues.  MMG due 11/14 but she hasn't had it done as she thought this area would cause it to be abnormal (and she is right).  No fevers.  Review of Systems  All other systems reviewed and are negative.       Objective:   Physical Exam  Constitutional: She is oriented to person, place, and time. She appears well-developed and well-nourished.  Pulmonary/Chest: Right breast exhibits mass and skin change. Right breast exhibits no inverted nipple, no nipple discharge and no tenderness. Left breast exhibits no inverted nipple, no mass, no nipple discharge, no skin change and no tenderness. Breasts are symmetrical.    Lymphadenopathy:       Right axillary: No pectoral and no lateral adenopathy present.       Left axillary: No pectoral and no lateral adenopathy present. Neurological: She is alert and oriented to person, place, and time.  Psychiatric: She has a normal mood and affect.   Recommend proceeding with I&D of abscess.  Verbal consent obtained.  Area cleaned with Betadine and injected with 1% Lidocaine.  Lesion opened with #11 blade.  Thick purulent material drained.  Culture obtained.  Lesion probed to ensure no loculations.  Packed with iodoform 1/4 inch gauze.  Pt tolerated procedure well.    Assessment:     Right breast abscess S/P I&D today     Plan:     Dicloxacillin 500mg  QID x 10 days Recheck 2 days. Pt to shower tomorrow and remove packing. Pt knows to call with any worsening symptoms or fever.

## 2013-03-01 ENCOUNTER — Encounter: Payer: Self-pay | Admitting: Obstetrics & Gynecology

## 2013-03-01 ENCOUNTER — Ambulatory Visit (INDEPENDENT_AMBULATORY_CARE_PROVIDER_SITE_OTHER): Payer: MEDICARE | Admitting: Obstetrics & Gynecology

## 2013-03-01 VITALS — BP 120/64 | HR 64 | Resp 16 | Ht 63.25 in | Wt 167.4 lb

## 2013-03-01 DIAGNOSIS — N611 Abscess of the breast and nipple: Secondary | ICD-10-CM

## 2013-03-01 DIAGNOSIS — N61 Mastitis without abscess: Secondary | ICD-10-CM

## 2013-03-01 NOTE — Progress Notes (Signed)
Subjective:     Patient ID: Leah Mata, female   DOB: 1945-04-29, 67 y.o.   MRN: [id number]  HPI 67 yo here for recheck after undergoing I&D of breast abscess 12/2.  No fevers.  Pt reports significant improvement in pain, redness, and drainage.  Jason Nest that was packed in abscess came out same day.  Reviewed preliminary culture report with pt.  On Dicloxacillin.  Culture showing gram + cocci.    Review of Systems  All other systems reviewed and are negative.       Objective:   Physical Exam  Constitutional: She appears well-developed and well-nourished.  Pulmonary/Chest: Right breast exhibits mass, skin change and tenderness (very mild). Right breast exhibits no inverted nipple and no nipple discharge. Left breast exhibits no inverted nipple, no mass, no nipple discharge, no skin change and no tenderness.    Lymphadenopathy:    She has no cervical adenopathy.    She has no axillary adenopathy.       Assessment:     Breast abscess, s/p I&D On antibiotics Culture still preliminary     Plan:     Will monitor for culture results and inform pt if antibiotics need to be changed.  As abscess was superficial, feel unlikely to be related to underlying breast pathology.  Still, pt should have MMG.  Advised she wait until area fully healed and then wait 4 more weeks before scheduling.  Voices understanding.

## 2013-03-01 NOTE — Patient Instructions (Signed)
Please call if you have any worsening symptoms.

## 2013-03-08 LAB — WOUND CULTURE

## 2013-03-09 NOTE — Telephone Encounter (Signed)
Message left to return call to Ysidro Ramsay at [phone].    

## 2013-03-09 NOTE — Telephone Encounter (Signed)
Message copied by Joeseph Amor on Fri Mar 09, 2013  9:00 AM ------      Message from: Jerene Bears      Created: Thu Mar 08, 2013  5:24 PM       Pt with superficial breast abscess. On correct antibiotic, although it's taken much longer to get this result than normal.  She does need to finish them all if she is not done with them.  Skin changes will take several more weeks to normalize. ------

## 2013-03-15 NOTE — Telephone Encounter (Signed)
Spoke with patient and message from Dr. Hyacinth Meeker given. States that she will continue and finish abx and call back if does not continue to improve.

## 2013-03-15 NOTE — Telephone Encounter (Signed)
Message left to return call to Calirose Mccance at [phone].    

## 2013-03-15 NOTE — Telephone Encounter (Signed)
Returning call.

## 2013-05-18 ENCOUNTER — Telehealth: Payer: Self-pay

## 2013-05-18 NOTE — Telephone Encounter (Signed)
Spoke with patient re: MMG. States she has new insurance and is trying to make sure it will cover her MMG. States she will get this scheduled. Offered to schedule for her because of the importance of having this done, but she said she would [facility] to schedule so she can discuss her insurance. ? Put back in reminder or ok to close.

## 2013-05-18 NOTE — Telephone Encounter (Signed)
Message copied by Elisha HeadlandNIX, Josey Forcier S on Fri May 18, 2013  4:22 PM ------      Message from: Jerene BearsMILLER, MARY S      Created: Tue May 01, 2013  5:27 AM      Regarding: FW: mmg       Pt has superficial breast abscess in November.  She was to have a MMG after was all healed.  I don't see a result.  Can you call and check on her and encourage her to schedule her MMG?  THanks.            MSM      ----- Message -----         From: Annamaria BootsMary Suzanne Miller, MD         Sent: 03/01/2013   1:50 PM           To: Annamaria BootsMary Suzanne Miller, MD      Subject: mmg                                                      See if pt has had done       ------

## 2013-05-22 NOTE — Telephone Encounter (Signed)
I put in reminders.  Encounter closed.

## 2013-05-25 ENCOUNTER — Telehealth: Payer: Self-pay | Admitting: *Deleted

## 2013-05-25 ENCOUNTER — Other Ambulatory Visit: Payer: Self-pay

## 2013-05-25 MED ORDER — AMLODIPINE BESYLATE 5 MG PO TABS: 5.0000 mg | ORAL_TABLET | Freq: Every day | ORAL | Status: DC

## 2013-05-25 MED ORDER — LISINOPRIL 20 MG PO TABS: ORAL_TABLET | ORAL | Status: DC

## 2013-05-25 NOTE — Telephone Encounter (Signed)
Patient phoned to confirm that pharmacy preference on file was right source, confirmed that it is.

## 2013-05-29 ENCOUNTER — Other Ambulatory Visit: Payer: Self-pay

## 2013-05-29 DIAGNOSIS — Z1231 Encounter for screening mammogram for malignant neoplasm of breast: Secondary | ICD-10-CM

## 2013-06-15 ENCOUNTER — Ambulatory Visit
Admission: RE | Admit: 2013-06-15 | Discharge: 2013-06-15 | Disposition: A | Discharge status: Home / Self Care | Payer: Commercial Managed Care - HMO | Source: Ambulatory Visit

## 2013-06-15 DIAGNOSIS — Z1231 Encounter for screening mammogram for malignant neoplasm of breast: Secondary | ICD-10-CM

## 2013-06-20 LAB — HM MAMMOGRAPHY

## 2013-07-31 ENCOUNTER — Ambulatory Visit (INDEPENDENT_AMBULATORY_CARE_PROVIDER_SITE_OTHER): Payer: Commercial Managed Care - HMO | Admitting: Internal Medicine

## 2013-07-31 ENCOUNTER — Encounter: Payer: Self-pay | Admitting: Internal Medicine

## 2013-07-31 VITALS — BP 118/80 | HR 98 | Temp 98.4°F | Ht 66.0 in | Wt 164.5 lb

## 2013-07-31 DIAGNOSIS — R7302 Impaired glucose tolerance (oral): Secondary | ICD-10-CM

## 2013-07-31 DIAGNOSIS — Z0000 Encounter for general adult medical examination without abnormal findings: Secondary | ICD-10-CM

## 2013-07-31 DIAGNOSIS — R7309 Other abnormal glucose: Secondary | ICD-10-CM

## 2013-07-31 MED ORDER — LISINOPRIL 20 MG PO TABS: ORAL_TABLET | ORAL | Status: DC

## 2013-07-31 MED ORDER — AMLODIPINE BESYLATE 5 MG PO TABS: 5.0000 mg | ORAL_TABLET | Freq: Every day | ORAL | Status: DC

## 2013-07-31 MED ORDER — TEMAZEPAM 15 MG PO CAPS: ORAL_CAPSULE | ORAL | Status: DC

## 2013-07-31 NOTE — Progress Notes (Signed)
Subjective:    Patient ID: Leah RuddLinda Mata, female    DOB: 08/30/1945, 68 y.o.   MRN: [id number]  HPI  Here for wellness and f/u;  Overall doing ok;  Pt denies CP, worsening SOB, DOE, wheezing, orthopnea, PND, worsening LE edema, palpitations, dizziness or syncope.  Pt denies neurological change such as new headache, facial or extremity weakness.  Pt denies polydipsia, polyuria, or low sugar symptoms. Pt states overall good compliance with treatment and medications, good tolerability, and has been trying to follow lower cholesterol diet.  Pt denies worsening depressive symptoms, suicidal ideation or panic. No fever, night sweats, wt loss, loss of appetite, or other constitutional symptoms.  Pt states good ability with ADL's, has low fall risk, home safety reviewed and adequate, no other significant changes in hearing or vision, and only occasionally active with exercise.  Not smoking, Had wt down to 150 at one point but regained it.  Overall good compliance with treatment, and good medicine tolerability. Due for refills. No other complaints Past Medical History  Diagnosis Date  . BACK PAIN 03/14/2009  . COPD 11/01/2006  . GLUCOSE INTOLERANCE 03/14/2009  . Headache(784.0) 03/14/2009  . HYPERTENSION 11/01/2006  . 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 INSOMNIA, HX OF 01/06/2007  . Urinary frequency 03/14/2009   Past Surgical History  Procedure Laterality Date  . Tubal ligation    . Tonsillectomy    . Dilation and curettage of uterus    . Cardiac catheterization  2003    negative    reports that she has been smoking.  She has never used smokeless tobacco. She reports that she does not drink alcohol or use illicit drugs. family history includes Congestive Heart Failure in her mother; Coronary artery disease in her other; Diabetes in her father and mother; Heart attack in her father; Hypertension in her mother. There is no history of Colon cancer. Allergies  Allergen Reactions  .  Hydrochlorothiazide    Current Outpatient Prescriptions on File Prior to Visit  Medication Sig Dispense Refill  . aspirin 81 MG tablet Take 81 mg by mouth daily.        . temazepam (RESTORIL) 15 MG capsule TAKE  1-TWO CAPSULES BY MOUTH AT BEDTIME AS NEEDED  60 capsule  5   No current facility-administered medications on file prior to visit.   Review of Systems Constitutional: Negative for increased diaphoresis, other activity, appetite or other siginficant weight change  HENT: Negative for worsening hearing loss, ear pain, facial swelling, mouth sores and neck stiffness.   Eyes: Negative for other worsening pain, redness or visual disturbance.  Respiratory: Negative for shortness of breath and wheezing.   Cardiovascular: Negative for chest pain and palpitations.  Gastrointestinal: Negative for diarrhea, blood in stool, abdominal distention or other pain Genitourinary: Negative for hematuria, flank pain or change in urine volume.  Musculoskeletal: Negative for myalgias or other joint complaints.  Skin: Negative for color change and wound.  Neurological: Negative for syncope and numbness. other than noted Hematological: Negative for adenopathy. or other swelling Psychiatric/Behavioral: Negative for hallucinations, self-injury, decreased concentration or other worsening agitation.      Objective:   Physical Exam BP 118/80  Pulse 98  Temp(Src) 98.4 F (36.9 C) (Oral)  Ht 5\' 6"  (1.676 m)  Wt 164 lb 8 oz (74.617 kg)  BMI 26.56 kg/m2  SpO2 97%  LMP 03/29/1993 VS noted,  Constitutional: Pt is oriented to person, place, and time. Appears well-developed and well-nourished.  Head: Normocephalic and atraumatic.  Right Ear: External ear normal.  Left Ear: External ear normal.  Nose: Nose normal.  Mouth/Throat: Oropharynx is clear and moist.  Eyes: Conjunctivae and EOM are normal. Pupils are equal, round, and reactive to light.  Neck: Normal range of motion. Neck supple. No JVD present.  No tracheal deviation present.  Cardiovascular: Normal rate, regular rhythm, normal heart sounds and intact distal pulses.   Pulmonary/Chest: Effort normal and breath sounds without rales or wheezing  Abdominal: Soft. Bowel sounds are normal. NT. No HSM  Musculoskeletal: Normal range of motion. Exhibits no edema.  Lymphadenopathy:  Has no cervical adenopathy.  Neurological: Pt is alert and oriented to person, place, and time. Pt has normal reflexes. No cranial nerve deficit. Motor grossly intact Skin: Skin is warm and dry. No rash noted.  Psychiatric:  Has normal mood and affect. Behavior is normal.     Assessment & Plan:

## 2013-07-31 NOTE — Assessment & Plan Note (Signed)
stable overall by history and exam, recent data reviewed with pt, and pt to continue medical treatment as before,  to f/u any worsening symptoms or concerns Lab Results  Component Value Date   HGBA1C 6.2 05/30/2012

## 2013-07-31 NOTE — Patient Instructions (Signed)
Please continue all other medications as before, and refills have been done if requested. Please have the pharmacy call with any other refills you may need.  Your temazepam will be faxed as requested  Please continue your efforts at being more active, low cholesterol diet, and weight control.  Please continue your efforts at being more active, low cholesterol diet, and weight control.  You are otherwise up to date with prevention measures today.  Please go to the LAB in the Basement (turn left off the elevator) for the tests to be done at your convenience  You will be contacted by phone if any changes need to be made immediately.  Otherwise, you will receive a letter about your results with an explanation, but please check with MyChart first.  Please remember to sign up for MyChart if you have not done so, as this will be important to you in the future with finding out test results, communicating by private email, and scheduling acute appointments online when needed.  Please return in 6 months, or sooner if needed

## 2013-07-31 NOTE — Assessment & Plan Note (Signed)
Overall doing well, age appropriate education and counseling updated, referrals for preventative services and immunizations addressed, dietary and smoking counseling addressed, most recent labs reviewed.  I have personally reviewed and have noted: 1) the patient's medical and social history 2) The pt's use of alcohol, tobacco, and illicit drugs 3) The patient's current medications and supplements 4) Functional ability including ADL's, fall risk, home safety risk, hearing and visual impairment 5) Diet and physical activities 6) Evidence for depression or mood disorder 7) The patient's height, weight, and BMI have been recorded in the chart I have made referrals, and provided counseling and education based on review of the above Declines prevnar today, maybe next, also for labs

## 2013-07-31 NOTE — Progress Notes (Signed)
Pre visit review using our clinic review tool, if applicable. No additional management support is needed unless otherwise documented below in the visit note. 

## 2013-08-17 ENCOUNTER — Other Ambulatory Visit (INDEPENDENT_AMBULATORY_CARE_PROVIDER_SITE_OTHER): Payer: Commercial Managed Care - HMO

## 2013-08-17 DIAGNOSIS — R7309 Other abnormal glucose: Secondary | ICD-10-CM

## 2013-08-17 DIAGNOSIS — R7302 Impaired glucose tolerance (oral): Secondary | ICD-10-CM

## 2013-08-17 DIAGNOSIS — Z0000 Encounter for general adult medical examination without abnormal findings: Secondary | ICD-10-CM

## 2013-08-17 LAB — HEPATIC FUNCTION PANEL
ALBUMIN: 3.7 g/dL (ref 3.5–5.2)
ALK PHOS: 84 U/L (ref 39–117)
ALT: 13 U/L (ref 0–35)
AST: 21 U/L (ref 0–37)
Bilirubin, Direct: 0.1 mg/dL (ref 0.0–0.3)
TOTAL PROTEIN: 6.8 g/dL (ref 6.0–8.3)
Total Bilirubin: 0.5 mg/dL (ref 0.2–1.2)

## 2013-08-17 LAB — URINALYSIS, ROUTINE W REFLEX MICROSCOPIC
Bilirubin Urine: NEGATIVE
Ketones, ur: NEGATIVE
Leukocytes, UA: NEGATIVE
NITRITE: NEGATIVE
Specific Gravity, Urine: 1.02 (ref 1.000–1.030)
Total Protein, Urine: NEGATIVE
Urine Glucose: NEGATIVE
Urobilinogen, UA: 0.2 (ref 0.0–1.0)
WBC UA: NONE SEEN (ref 0–?)
pH: 6 (ref 5.0–8.0)

## 2013-08-17 LAB — CBC WITH DIFFERENTIAL/PLATELET
BASOS ABS: 0.1 10*3/uL (ref 0.0–0.1)
Basophils Relative: 0.6 % (ref 0.0–3.0)
Eosinophils Absolute: 0.2 10*3/uL (ref 0.0–0.7)
Eosinophils Relative: 2.3 % (ref 0.0–5.0)
HCT: 40.5 % (ref 36.0–46.0)
Hemoglobin: 13.9 g/dL (ref 12.0–15.0)
LYMPHS PCT: 28.8 % (ref 12.0–46.0)
Lymphs Abs: 2.4 10*3/uL (ref 0.7–4.0)
MCHC: 34.3 g/dL (ref 30.0–36.0)
MCV: 93.4 fl (ref 78.0–100.0)
Monocytes Absolute: 0.6 10*3/uL (ref 0.1–1.0)
Monocytes Relative: 6.6 % (ref 3.0–12.0)
Neutro Abs: 5.2 10*3/uL (ref 1.4–7.7)
Neutrophils Relative %: 61.7 % (ref 43.0–77.0)
Platelets: 207 10*3/uL (ref 150.0–400.0)
RBC: 4.34 Mil/uL (ref 3.87–5.11)
RDW: 13.2 % (ref 11.5–15.5)
WBC: 8.4 10*3/uL (ref 4.0–10.5)

## 2013-08-17 LAB — BASIC METABOLIC PANEL
BUN: 12 mg/dL (ref 6–23)
CALCIUM: 9.7 mg/dL (ref 8.4–10.5)
CO2: 25 meq/L (ref 19–32)
Chloride: 102 mEq/L (ref 96–112)
Creatinine, Ser: 1 mg/dL (ref 0.4–1.2)
GFR: 62.48 mL/min (ref >60.00)
GLUCOSE: 191 mg/dL — AB (ref 70–99)
Potassium: 3.9 mEq/L (ref 3.5–5.1)
SODIUM: 136 meq/L (ref 135–145)

## 2013-08-17 LAB — LIPID PANEL
CHOLESTEROL: 184 mg/dL (ref 0–200)
HDL: 46.1 mg/dL (ref >39.00)
LDL Cholesterol: 117 mg/dL — ABNORMAL HIGH (ref 0–99)
TRIGLYCERIDES: 103 mg/dL (ref 0.0–149.0)
Total CHOL/HDL Ratio: 4
VLDL: 20.6 mg/dL (ref 0.0–40.0)

## 2013-08-17 LAB — HEMOGLOBIN A1C: HEMOGLOBIN A1C: 6.3 % (ref 4.6–6.5)

## 2013-08-17 LAB — TSH: TSH: 0.43 u[IU]/mL (ref 0.35–4.50)

## 2013-08-22 ENCOUNTER — Encounter: Payer: Self-pay | Admitting: Internal Medicine

## 2013-08-22 ENCOUNTER — Other Ambulatory Visit: Payer: Self-pay | Admitting: Internal Medicine

## 2013-08-22 DIAGNOSIS — E785 Hyperlipidemia, unspecified: Secondary | ICD-10-CM | POA: Insufficient documentation

## 2013-08-22 HISTORY — DX: Hyperlipidemia, unspecified: E78.5

## 2013-08-22 MED ORDER — PRAVASTATIN SODIUM 20 MG PO TABS: 20.0000 mg | ORAL_TABLET | Freq: Every day | ORAL | Status: DC

## 2013-12-27 ENCOUNTER — Encounter: Payer: Self-pay | Admitting: Internal Medicine

## 2014-01-28 ENCOUNTER — Encounter: Payer: Self-pay | Admitting: Internal Medicine

## 2014-01-31 ENCOUNTER — Ambulatory Visit (INDEPENDENT_AMBULATORY_CARE_PROVIDER_SITE_OTHER): Payer: Commercial Managed Care - HMO | Admitting: Internal Medicine

## 2014-01-31 ENCOUNTER — Other Ambulatory Visit (INDEPENDENT_AMBULATORY_CARE_PROVIDER_SITE_OTHER): Payer: Commercial Managed Care - HMO

## 2014-01-31 ENCOUNTER — Encounter: Payer: Self-pay | Admitting: Obstetrics & Gynecology

## 2014-01-31 ENCOUNTER — Encounter: Payer: Self-pay | Admitting: Internal Medicine

## 2014-01-31 ENCOUNTER — Ambulatory Visit (INDEPENDENT_AMBULATORY_CARE_PROVIDER_SITE_OTHER): Payer: Medicare HMO | Admitting: Obstetrics & Gynecology

## 2014-01-31 VITALS — BP 158/78 | HR 84 | Resp 25 | Ht 63.5 in | Wt 166.0 lb

## 2014-01-31 VITALS — BP 144/90 | HR 110 | Temp 98.5°F | Ht 66.0 in | Wt 165.4 lb

## 2014-01-31 DIAGNOSIS — R7302 Impaired glucose tolerance (oral): Secondary | ICD-10-CM

## 2014-01-31 DIAGNOSIS — E785 Hyperlipidemia, unspecified: Secondary | ICD-10-CM

## 2014-01-31 DIAGNOSIS — Z23 Encounter for immunization: Secondary | ICD-10-CM

## 2014-01-31 DIAGNOSIS — F17200 Nicotine dependence, unspecified, uncomplicated: Secondary | ICD-10-CM

## 2014-01-31 DIAGNOSIS — I10 Essential (primary) hypertension: Secondary | ICD-10-CM

## 2014-01-31 DIAGNOSIS — Z0000 Encounter for general adult medical examination without abnormal findings: Secondary | ICD-10-CM

## 2014-01-31 DIAGNOSIS — Z124 Encounter for screening for malignant neoplasm of cervix: Secondary | ICD-10-CM

## 2014-01-31 DIAGNOSIS — Z0189 Encounter for other specified special examinations: Secondary | ICD-10-CM

## 2014-01-31 DIAGNOSIS — Z01419 Encounter for gynecological examination (general) (routine) without abnormal findings: Secondary | ICD-10-CM

## 2014-01-31 DIAGNOSIS — Z720 Tobacco use: Secondary | ICD-10-CM

## 2014-01-31 LAB — LIPID PANEL
CHOLESTEROL: 165 mg/dL (ref 0–200)
HDL: 42 mg/dL (ref >39.00)
NonHDL: 123
Total CHOL/HDL Ratio: 4
Triglycerides: 328 mg/dL — ABNORMAL HIGH (ref 0.0–149.0)
VLDL: 65.6 mg/dL — AB (ref 0.0–40.0)

## 2014-01-31 LAB — BASIC METABOLIC PANEL
BUN: 11 mg/dL (ref 6–23)
CALCIUM: 9.5 mg/dL (ref 8.4–10.5)
CO2: 28 mEq/L (ref 19–32)
Chloride: 102 mEq/L (ref 96–112)
Creatinine, Ser: 0.9 mg/dL (ref 0.4–1.2)
GFR: 67.27 mL/min (ref >60.00)
GLUCOSE: 112 mg/dL — AB (ref 70–99)
Potassium: 4 mEq/L (ref 3.5–5.1)
Sodium: 136 mEq/L (ref 135–145)

## 2014-01-31 LAB — HEPATIC FUNCTION PANEL
ALBUMIN: 3.2 g/dL — AB (ref 3.5–5.2)
ALT: 12 U/L (ref 0–35)
AST: 19 U/L (ref 0–37)
Alkaline Phosphatase: 82 U/L (ref 39–117)
Bilirubin, Direct: 0 mg/dL (ref 0.0–0.3)
Total Bilirubin: 0.3 mg/dL (ref 0.2–1.2)
Total Protein: 6.7 g/dL (ref 6.0–8.3)

## 2014-01-31 LAB — HEMOGLOBIN A1C: Hgb A1c MFr Bld: 6.3 % (ref 4.6–6.5)

## 2014-01-31 LAB — LDL CHOLESTEROL, DIRECT: Direct LDL: 84.1 mg/dL

## 2014-01-31 MED ORDER — AMLODIPINE BESYLATE 5 MG PO TABS: 5.0000 mg | ORAL_TABLET | Freq: Every day | ORAL | Status: DC

## 2014-01-31 MED ORDER — LISINOPRIL 20 MG PO TABS: ORAL_TABLET | ORAL | Status: DC

## 2014-01-31 MED ORDER — CEPHALEXIN 500 MG PO CAPS: 500.0000 mg | ORAL_CAPSULE | Freq: Four times a day (QID) | ORAL | Status: DC

## 2014-01-31 MED ORDER — TEMAZEPAM 15 MG PO CAPS: ORAL_CAPSULE | ORAL | Status: DC

## 2014-01-31 MED ORDER — PRAVASTATIN SODIUM 20 MG PO TABS: 20.0000 mg | ORAL_TABLET | Freq: Every day | ORAL | Status: DC

## 2014-01-31 NOTE — Progress Notes (Signed)
Subjective:    Patient ID: Leah Mata, female    DOB: 06/11/1945, 68 y.o.   MRN: [id number]  HPI  Here to f/u; overall doing ok,  Pt denies chest pain, increased sob or doe, wheezing, orthopnea, PND, increased LE swelling, palpitations, dizziness or syncope.  Pt denies polydipsia, polyuria, or low sugar symptoms such as weakness or confusion improved with po intake.  Pt denies new neurological symptoms such as new headache, or facial or extremity weakness or numbness.   Pt states overall good compliance with meds, has been trying to follow lower cholesterol diet, with wt overall stable,  but little exercise however.  Still smoking a few cigs per day, re-started a yr ago. Past Medical History  Diagnosis Date  . BACK PAIN 03/14/2009  . COPD 11/01/2006  . GLUCOSE INTOLERANCE 03/14/2009  . Headache(784.0) 03/14/2009  . HYPERTENSION 11/01/2006  . 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 INSOMNIA, HX OF 01/06/2007  . Urinary frequency 03/14/2009  . Other and unspecified hyperlipidemia 08/22/2013   Past Surgical History  Procedure Laterality Date  . Tubal ligation    . Tonsillectomy    . Dilation and curettage of uterus    . Cardiac catheterization  2003    negative    reports that she has been smoking.  She has never used smokeless tobacco. She reports that she does not drink alcohol or use illicit drugs. family history includes Congestive Heart Failure in her mother; Coronary artery disease in her other; Diabetes in her father and mother; Heart attack in her father; Hypertension in her mother. There is no history of Colon cancer. Allergies  Allergen Reactions  . Hydrochlorothiazide    Current Outpatient Prescriptions on File Prior to Visit  Medication Sig Dispense Refill  . aspirin 81 MG tablet Take 81 mg by mouth daily.      . cephALEXin (KEFLEX) 500 MG capsule Take 1 capsule (500 mg total) by mouth 4 (four) times daily. Take QID for 7 days. 28 capsule 0  . temazepam (RESTORIL) 15 MG  capsule TAKE  1 CAPSULES BY MOUTH AT BEDTIME AS NEEDED 90 capsule 1   No current facility-administered medications on file prior to visit.   Review of Systems  Constitutional: Negative for unusual diaphoresis or other sweats  HENT: Negative for ringing in ear Eyes: Negative for double vision or worsening visual disturbance.  Respiratory: Negative for choking and stridor.   Gastrointestinal: Negative for vomiting or other signifcant bowel change Genitourinary: Negative for hematuria or decreased urine volume.  Musculoskeletal: Negative for other MSK pain or swelling Skin: Negative for color change and worsening wound.  Neurological: Negative for tremors and numbness other than noted  Psychiatric/Behavioral: Negative for decreased concentration or agitation other than above       Objective:   Physical Exam BP 144/90 mmHg  Pulse 110  Temp(Src) 98.5 F (36.9 C) (Oral)  Ht 5\' 6"  (1.676 m)  Wt 165 lb 6 oz (75.014 kg)  BMI 26.71 kg/m2  SpO2 97%  LMP 03/29/1993 /VS noted,  Constitutional: Pt appears well-developed, well-nourished.  HENT: Head: NCAT.  Right Ear: External ear normal.  Left Ear: External ear normal.  Eyes: . Pupils are equal, round, and reactive to light. Conjunctivae and EOM are normal Neck: Normal range of motion. Neck supple.  Cardiovascular: Normal rate and regular rhythm.   Pulmonary/Chest: Effort normal and breath sounds normal.  Abd:  Soft, NT, ND, + BS Neurological: Pt is alert. Not confused , motor grossly intact  Skin: Skin is warm. No rash Psychiatric: Pt behavior is normal. No agitation.      Assessment & Plan:

## 2014-01-31 NOTE — Assessment & Plan Note (Signed)
Urged to quit 

## 2014-01-31 NOTE — Progress Notes (Signed)
Pre visit review using our clinic review tool, if applicable. No additional management support is needed unless otherwise documented below in the visit note. 

## 2014-01-31 NOTE — Assessment & Plan Note (Signed)
stable overall by history and exam, recent data reviewed with pt, and pt to continue medical treatment as before,  to f/u any worsening symptoms or concerns Lab Results  Component Value Date   LDLCALC 117* 08/17/2013   Tolerating statin, for f/u labs today

## 2014-01-31 NOTE — Progress Notes (Addendum)
68 y.o. G2P2 WidowedCaucasianF here for annual exam.  Having recurrent issues with superficial skin abscesses on breast.  Just had another one that ruptured two days ago.  Still very tender for pt.    PCP:  Dr. Fayrene FearingJames, PCP.  Seeing today.  Patient's last menstrual period was 03/29/1993.          Sexually active: No.  The current method of family planning is tubal ligation.    Exercising: No.  not regularly Smoker:  Yes   Health Maintenance: Pap:  11/04/11 WNL/negative HR HPV History of abnormal Pap:  no MMG:  06/15/13-normal  Colonoscopy:  4/14-repeat in 10 years, Dr. Juanda ChanceBrodie, negative BMD:   11/26/10 TDaP:  2007 Screening Labs: PCP, Hb today: PCP, Urine today: PCP   reports that she has been smoking.  She has never used smokeless tobacco. She reports that she does not drink alcohol or use illicit drugs.  Past Medical History  Diagnosis Date  . BACK PAIN 03/14/2009  . COPD 11/01/2006  . GLUCOSE INTOLERANCE 03/14/2009  . Headache(784.0) 03/14/2009  . HYPERTENSION 11/01/2006  . 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 INSOMNIA, HX OF 01/06/2007  . Urinary frequency 03/14/2009  . Other and unspecified hyperlipidemia 08/22/2013    Past Surgical History  Procedure Laterality Date  . Tubal ligation    . Tonsillectomy    . Dilation and curettage of uterus    . Cardiac catheterization  2003    negative    Current Outpatient Prescriptions  Medication Sig Dispense Refill  . amLODipine (NORVASC) 5 MG tablet Take 1 tablet (5 mg total) by mouth daily. 90 tablet 3  . aspirin 81 MG tablet Take 81 mg by mouth daily.      Marland Kitchen. lisinopril (PRINIVIL,ZESTRIL) 20 MG tablet TAKE 1 TABLET BY MOUTH DAILY 90 tablet 3  . pravastatin (PRAVACHOL) 20 MG tablet Take 1 tablet (20 mg total) by mouth daily. 90 tablet 3  . temazepam (RESTORIL) 15 MG capsule TAKE  1 CAPSULES BY MOUTH AT BEDTIME AS NEEDED 90 capsule 1   No current facility-administered medications for this visit.    Family History  Problem  Relation Age of Onset  . Coronary artery disease Other     female 1st degree relative <60 and female 1st degree relative <50  . Colon cancer Neg Hx   . Diabetes Father   . Diabetes Mother   . Hypertension Mother   . Congestive Heart Failure Mother   . Heart attack Father     ROS:  Pertinent items are noted in HPI.  Otherwise, a comprehensive ROS was negative.  Exam:   BP 158/78 mmHg  Pulse 84  Resp 25  Ht 5' 3.5" (1.613 m)  Wt 166 lb (75.297 kg)  BMI 28.94 kg/m2  LMP 03/29/1993  Weight change:  Height: 5' 3.5" (161.3 cm)  Ht Readings from Last 3 Encounters:  01/31/14 5' 3.5" (1.613 m)  07/31/13 5\' 6"  (1.676 m)  03/01/13 5' 3.25" (1.607 m)    General appearance: alert, cooperative and appears stated age Head: Normocephalic, without obvious abnormality, atraumatic Neck: no adenopathy, supple, symmetrical, trachea midline and thyroid normal to inspection and palpation Lungs: clear to auscultation bilaterally Breasts: normal appearance, no masses or tenderness, except for recent superficial abscess in inferior crease of breast in midline on left Heart: regular rate and rhythm Abdomen: soft, non-tender; bowel sounds normal; no masses,  no organomegaly Extremities: extremities normal, atraumatic, no cyanosis or edema Skin: Skin color, texture, turgor normal.  No rashes or lesions Lymph nodes: Cervical, supraclavicular, and axillary nodes normal. No abnormal inguinal nodes palpated Neurologic: Grossly normal   Pelvic: External genitalia:  no lesions              Urethra:  normal appearing urethra with no masses, tenderness or lesions              Bartholins and Skenes: normal                 Vagina: normal appearing vagina with normal color and discharge, no lesions, atrophic appearance              Cervix: no lesions              Pap taken: Yes.   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  Pap today. Seeing Dr. [name] this morning. Keflex 500mg  qid x 7 days.  rx to pharmacy. return annually or prn  An After Visit Summary was printed and given to the patient.

## 2014-01-31 NOTE — Patient Instructions (Addendum)
You had the flu shot today  Please continue all other medications as before, and refills have been done if requested - the temazepam  Please have the pharmacy call with any other refills you may need.  Please continue your efforts at being more active, low cholesterol diet, and weight control.  You are otherwise up to date with prevention measures today.  Please keep your appointments with your specialists as you may have planned  Please go to the LAB in the Basement (turn left off the elevator) for the tests to be done today  You will be contacted by phone if any changes need to be made immediately.  Otherwise, you will receive a letter about your results with an explanation, but please check with MyChart first.  Please remember to sign up for MyChart if you have not done so, as this will be important to you in the future with finding out test results, communicating by private email, and scheduling acute appointments online when needed.  Please return in 6 months, or sooner if needed, with Lab testing done 3-5 days before  Please stop smoking

## 2014-01-31 NOTE — Assessment & Plan Note (Signed)
Asympt,  to f/u any worsening symptoms or concerns, for f/u lab

## 2014-01-31 NOTE — Assessment & Plan Note (Signed)
Mild elev today, somewhat stressed on arrival due to upset over billing at her GYN she saw this am, o/w stable overall by history and exam, recent data reviewed with pt, and pt to continue medical treatment as before,  to f/u any worsening symptoms or concerns BP Readings from Last 3 Encounters:  01/31/14 144/90  01/31/14 158/78  07/31/13 118/80

## 2014-02-01 ENCOUNTER — Telehealth: Payer: Self-pay | Admitting: Internal Medicine

## 2014-02-01 LAB — IPS PAP SMEAR ONLY

## 2014-02-01 NOTE — Telephone Encounter (Signed)
emmi mailed  °

## 2014-02-05 ENCOUNTER — Encounter: Payer: Self-pay | Admitting: Internal Medicine

## 2014-02-07 NOTE — Telephone Encounter (Signed)
Error/gd °

## 2014-05-27 ENCOUNTER — Other Ambulatory Visit: Payer: Self-pay | Admitting: Internal Medicine

## 2014-07-25 ENCOUNTER — Other Ambulatory Visit: Payer: Self-pay

## 2014-07-25 DIAGNOSIS — Z1231 Encounter for screening mammogram for malignant neoplasm of breast: Secondary | ICD-10-CM

## 2014-08-01 ENCOUNTER — Ambulatory Visit
Admission: RE | Admit: 2014-08-01 | Discharge: 2014-08-01 | Disposition: A | Discharge status: Home / Self Care | Payer: Commercial Managed Care - HMO | Source: Ambulatory Visit

## 2014-08-01 ENCOUNTER — Other Ambulatory Visit: Payer: Self-pay | Admitting: Obstetrics & Gynecology

## 2014-08-01 ENCOUNTER — Encounter (INDEPENDENT_AMBULATORY_CARE_PROVIDER_SITE_OTHER): Payer: Self-pay

## 2014-08-01 DIAGNOSIS — Z1231 Encounter for screening mammogram for malignant neoplasm of breast: Secondary | ICD-10-CM | POA: Diagnosis not present

## 2014-08-01 DIAGNOSIS — R928 Other abnormal and inconclusive findings on diagnostic imaging of breast: Secondary | ICD-10-CM

## 2014-08-06 ENCOUNTER — Ambulatory Visit: Payer: Commercial Managed Care - HMO | Admitting: Internal Medicine

## 2014-08-14 ENCOUNTER — Ambulatory Visit
Admission: RE | Admit: 2014-08-14 | Discharge: 2014-08-14 | Disposition: A | Discharge status: Home / Self Care | Payer: Commercial Managed Care - HMO | Source: Ambulatory Visit | Attending: Obstetrics & Gynecology | Admitting: Obstetrics & Gynecology

## 2014-08-14 DIAGNOSIS — R928 Other abnormal and inconclusive findings on diagnostic imaging of breast: Secondary | ICD-10-CM | POA: Diagnosis not present

## 2014-08-15 ENCOUNTER — Ambulatory Visit (INDEPENDENT_AMBULATORY_CARE_PROVIDER_SITE_OTHER): Payer: Commercial Managed Care - HMO | Admitting: Internal Medicine

## 2014-08-15 ENCOUNTER — Encounter: Payer: Self-pay | Admitting: Internal Medicine

## 2014-08-15 ENCOUNTER — Other Ambulatory Visit (INDEPENDENT_AMBULATORY_CARE_PROVIDER_SITE_OTHER): Payer: Commercial Managed Care - HMO

## 2014-08-15 VITALS — BP 118/76 | HR 84 | Temp 97.7°F | Wt 169.4 lb

## 2014-08-15 DIAGNOSIS — Z23 Encounter for immunization: Secondary | ICD-10-CM | POA: Diagnosis not present

## 2014-08-15 DIAGNOSIS — Z720 Tobacco use: Secondary | ICD-10-CM | POA: Diagnosis not present

## 2014-08-15 DIAGNOSIS — Z0000 Encounter for general adult medical examination without abnormal findings: Secondary | ICD-10-CM

## 2014-08-15 DIAGNOSIS — R7302 Impaired glucose tolerance (oral): Secondary | ICD-10-CM

## 2014-08-15 DIAGNOSIS — F17200 Nicotine dependence, unspecified, uncomplicated: Secondary | ICD-10-CM

## 2014-08-15 LAB — URINALYSIS, ROUTINE W REFLEX MICROSCOPIC
BILIRUBIN URINE: NEGATIVE
Ketones, ur: NEGATIVE
LEUKOCYTES UA: NEGATIVE
NITRITE: NEGATIVE
Total Protein, Urine: NEGATIVE
Urine Glucose: NEGATIVE
Urobilinogen, UA: 0.2 (ref 0.0–1.0)
WBC, UA: NONE SEEN (ref 0–?)
pH: 7 (ref 5.0–8.0)

## 2014-08-15 LAB — CBC WITH DIFFERENTIAL/PLATELET
BASOS ABS: 0 10*3/uL (ref 0.0–0.1)
Basophils Relative: 0.4 % (ref 0.0–3.0)
Eosinophils Absolute: 0.3 10*3/uL (ref 0.0–0.7)
Eosinophils Relative: 3.1 % (ref 0.0–5.0)
HCT: 39.4 % (ref 36.0–46.0)
HEMOGLOBIN: 13.5 g/dL (ref 12.0–15.0)
LYMPHS PCT: 23.9 % (ref 12.0–46.0)
Lymphs Abs: 2.1 10*3/uL (ref 0.7–4.0)
MCHC: 34.2 g/dL (ref 30.0–36.0)
MCV: 92.3 fl (ref 78.0–100.0)
Monocytes Absolute: 0.7 10*3/uL (ref 0.1–1.0)
Monocytes Relative: 8 % (ref 3.0–12.0)
NEUTROS ABS: 5.8 10*3/uL (ref 1.4–7.7)
NEUTROS PCT: 64.6 % (ref 43.0–77.0)
PLATELETS: 202 10*3/uL (ref 150.0–400.0)
RBC: 4.26 Mil/uL (ref 3.87–5.11)
RDW: 13.1 % (ref 11.5–15.5)
WBC: 8.9 10*3/uL (ref 4.0–10.5)

## 2014-08-15 LAB — LIPID PANEL
Cholesterol: 160 mg/dL (ref 0–200)
HDL: 54.8 mg/dL (ref >39.00)
LDL Cholesterol: 72 mg/dL (ref 0–99)
NONHDL: 105.2
TRIGLYCERIDES: 168 mg/dL — AB (ref 0.0–149.0)
Total CHOL/HDL Ratio: 3
VLDL: 33.6 mg/dL (ref 0.0–40.0)

## 2014-08-15 LAB — HEPATIC FUNCTION PANEL
ALBUMIN: 4.2 g/dL (ref 3.5–5.2)
ALT: 19 U/L (ref 0–35)
AST: 24 U/L (ref 0–37)
Alkaline Phosphatase: 85 U/L (ref 39–117)
BILIRUBIN TOTAL: 0.6 mg/dL (ref 0.2–1.2)
Bilirubin, Direct: 0.1 mg/dL (ref 0.0–0.3)
Total Protein: 7.3 g/dL (ref 6.0–8.3)

## 2014-08-15 LAB — BASIC METABOLIC PANEL
BUN: 12 mg/dL (ref 6–23)
CO2: 30 mEq/L (ref 19–32)
Calcium: 10.6 mg/dL — ABNORMAL HIGH (ref 8.4–10.5)
Chloride: 101 mEq/L (ref 96–112)
Creatinine, Ser: 0.8 mg/dL (ref 0.40–1.20)
GFR: 75.96 mL/min (ref >60.00)
Glucose, Bld: 104 mg/dL — ABNORMAL HIGH (ref 70–99)
POTASSIUM: 5.3 meq/L — AB (ref 3.5–5.1)
Sodium: 134 mEq/L — ABNORMAL LOW (ref 135–145)

## 2014-08-15 LAB — HEMOGLOBIN A1C: Hgb A1c MFr Bld: 6 % (ref 4.6–6.5)

## 2014-08-15 LAB — TSH: TSH: 0.61 u[IU]/mL (ref 0.35–4.50)

## 2014-08-15 MED ORDER — TEMAZEPAM 15 MG PO CAPS: ORAL_CAPSULE | ORAL | Status: DC

## 2014-08-15 NOTE — Addendum Note (Signed)
Addended by: Maurene CapesPOTTS, Presly Steinruck M on: 08/15/2014 09:49 AM   Modules accepted: Orders

## 2014-08-15 NOTE — Patient Instructions (Addendum)
You had the new Prevnar pneumonia shot today  Please continue all other medications as before, and refills have been done if requested - the temazepam  Please have the pharmacy call with any other refills you may need.  Please continue your efforts at being more active, low cholesterol diet, and weight control.  You are otherwise up to date with prevention measures today.  Please keep your appointments with your specialists as you may have planned  Please try the nicotine patches for smoking; please call if you would like to try the Chantix as well  Please call [employer] to see about your Copay for a Low Dose CT scan of the Chest, for lung cancer screening, and this could be scheduled  Please go to the LAB in the Basement (turn left off the elevator) for the tests to be done today  You will be contacted by phone if any changes need to be made immediately.  Otherwise, you will receive a letter about your results with an explanation, but please check with MyChart first.  Please remember to sign up for MyChart if you have not done so, as this will be important to you in the future with finding out test results, communicating by private email, and scheduling acute appointments online when needed.  Please return in 6 months, or sooner if needed

## 2014-08-15 NOTE — Assessment & Plan Note (Signed)
Urged to quit, pt to try otc nicotine patch, pt to consider LDCT for lung cancer screening,

## 2014-08-15 NOTE — Progress Notes (Signed)
Subjective:    Patient ID: Leah JewelLinda D Mata, female    DOB: 09/19/1945, 69 y.o.   MRN: [id number]  HPI  Here for wellness and f/u;  Overall doing ok;  Pt denies Chest pain, worsening SOB, DOE, wheezing, orthopnea, PND, worsening LE edema, palpitations, dizziness or syncope.  Pt denies neurological change such as new headache, facial or extremity weakness.  Pt denies polydipsia, polyuria, or low sugar symptoms. Pt states overall good compliance with treatment and medications, good tolerability, and has been trying to follow appropriate diet.  Pt denies worsening depressive symptoms, suicidal ideation or panic. No fever, night sweats, wt loss, loss of appetite, or other constitutional symptoms.  Pt states good ability with ADL's, has low fall risk, home safety reviewed and adequate, no other significant changes in hearing or vision, and only occasionally active with exercise.  No complaints. Still smoking, plans to try the otc nicotine patches before asking for chantix due to cost, now at 1 ppd for 43 yrs.  [facility] for lung cancer screening.  Still with sleeping isues, asks for temazepam qhs refill Past Medical History  Diagnosis Date  . BACK PAIN 03/14/2009  . COPD 11/01/2006  . GLUCOSE INTOLERANCE 03/14/2009  . Headache(784.0) 03/14/2009  . HYPERTENSION 11/01/2006  . 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 INSOMNIA, HX OF 01/06/2007  . Urinary frequency 03/14/2009  . Other and unspecified hyperlipidemia 08/22/2013   Past Surgical History  Procedure Laterality Date  . Tubal ligation    . Tonsillectomy    . Dilation and curettage of uterus    . Cardiac catheterization  2003    negative    reports that she has been smoking.  She has never used smokeless tobacco. She reports that she does not drink alcohol or use illicit drugs. family history includes Congestive Heart Failure in her mother; Coronary artery disease in her other; Diabetes in her  father and mother; Heart attack in her father; Hypertension in her mother. There is no history of Colon cancer. Allergies  Allergen Reactions  . Hydrochlorothiazide    Current Outpatient Prescriptions on File Prior to Visit  Medication Sig Dispense Refill  . amLODipine (NORVASC) 5 MG tablet Take 1 tablet (5 mg total) by mouth daily. 90 tablet 3  . aspirin 81 MG tablet Take 81 mg by mouth daily.      Marland Kitchen. lisinopril (PRINIVIL,ZESTRIL) 20 MG tablet TAKE 1 TABLET EVERY DAY 90 tablet 3  . pravastatin (PRAVACHOL) 20 MG tablet Take 1 tablet (20 mg total) by mouth daily. 90 tablet 3  . temazepam (RESTORIL) 15 MG capsule TAKE  1 CAPSULES BY MOUTH AT BEDTIME AS NEEDED 90 capsule 1  . cephALEXin (KEFLEX) 500 MG capsule Take 1 capsule (500 mg total) by mouth 4 (four) times daily. Take QID for 7 days. (Patient not taking: Reported on 08/15/2014) 28 capsule 0   No current facility-administered medications on file prior to visit.     Review of Systems Constitutional: Negative for increased diaphoresis, other activity, appetite or siginficant weight change other than noted HENT: Negative for worsening hearing loss, ear pain, facial swelling, mouth sores and neck stiffness.   Eyes: Negative for other worsening pain, redness or visual disturbance.  Respiratory: Negative for shortness of breath and wheezing  Cardiovascular: Negative for chest pain and palpitations.  Gastrointestinal: Negative for diarrhea, blood in stool, abdominal distention or other pain Genitourinary: Negative for hematuria, flank pain or change in urine  volume.  Musculoskeletal: Negative for myalgias or other joint complaints.  Skin: Negative for color change and wound or drainage.  Neurological: Negative for syncope and numbness. other than noted Hematological: Negative for adenopathy. or other swelling Psychiatric/Behavioral: Negative for hallucinations, SI, self-injury, decreased concentration or other worsening agitation.        Objective:   Physical Exam BP 118/76 mmHg  Pulse 84  Temp(Src) 97.7 F (36.5 C) (Oral)  Wt 169 lb 6.4 oz (76.839 kg)  SpO2 99%  LMP 03/29/1993 VS noted,  Constitutional: Pt is oriented to person, place, and time. Appears well-developed and well-nourished, in no significant distress Head: Normocephalic and atraumatic.  Right Ear: External ear normal.  Left Ear: External ear normal.  Nose: Nose normal.  Mouth/Throat: Oropharynx is clear and moist.  Eyes: Conjunctivae and EOM are normal. Pupils are equal, round, and reactive to light.  Neck: Normal range of motion. Neck supple. No JVD present. No tracheal deviation present or significant neck LA or mass Cardiovascular: Normal rate, regular rhythm, normal heart sounds and intact distal pulses.   Pulmonary/Chest: Effort normal and breath sounds without rales or wheezing  Abdominal: Soft. Bowel sounds are normal. NT. No HSM  Musculoskeletal: Normal range of motion. Exhibits no edema.  Lymphadenopathy:  Has no cervical adenopathy.  Neurological: Pt is alert and oriented to person, place, and time. Pt has normal reflexes. No cranial nerve deficit. Motor grossly intact Skin: Skin is warm and dry. No rash noted.  Psychiatric:  Has normal mood and affect. Behavior is normal.     Assessment & Plan:

## 2014-08-15 NOTE — Assessment & Plan Note (Signed)
stable overall by history and exam, recent data reviewed with pt, and pt to continue medical treatment as before,  to f/u any worsening symptoms or concerns Lab Results  Component Value Date   HGBA1C 6.3 01/31/2014   For f/u a1c

## 2014-08-15 NOTE — Assessment & Plan Note (Signed)

## 2014-08-15 NOTE — Progress Notes (Signed)
Pre visit review using our clinic review tool, if applicable. No additional management support is needed unless otherwise documented below in the visit note. 

## 2014-09-25 ENCOUNTER — Telehealth: Payer: Self-pay | Admitting: Emergency Medicine

## 2014-09-25 NOTE — Telephone Encounter (Signed)
-----   Message from Jerene BearsMary S Miller, MD sent at 09/25/2014  2:41 PM EDT ----- Regarding: RE: Mammogram hold  Reviewed again and yes, ok ot remove from hold.  MSM ----- Message -----    From: Joeseph Amorracy L Vicent Febles, RN    Sent: 09/25/2014  10:32 AM      To: Jerene BearsMary S Miller, MD Subject: Mammogram hold                                 Patient in Mammogram hold. [facility]. Okay to remove from hold?

## 2014-11-18 ENCOUNTER — Other Ambulatory Visit: Payer: Self-pay | Admitting: Internal Medicine

## 2014-12-03 DIAGNOSIS — R05 Cough: Secondary | ICD-10-CM | POA: Diagnosis not present

## 2014-12-03 DIAGNOSIS — J0100 Acute maxillary sinusitis, unspecified: Secondary | ICD-10-CM | POA: Diagnosis not present

## 2014-12-09 DIAGNOSIS — J209 Acute bronchitis, unspecified: Secondary | ICD-10-CM | POA: Diagnosis not present

## 2014-12-09 DIAGNOSIS — J0100 Acute maxillary sinusitis, unspecified: Secondary | ICD-10-CM | POA: Diagnosis not present

## 2014-12-10 ENCOUNTER — Ambulatory Visit: Payer: Commercial Managed Care - HMO | Admitting: Internal Medicine

## 2014-12-10 ENCOUNTER — Telehealth: Payer: Self-pay | Admitting: Internal Medicine

## 2014-12-10 NOTE — Telephone Encounter (Signed)
Rec'd from [facility] forward 3 pages to Dr.John °

## 2014-12-19 ENCOUNTER — Ambulatory Visit (INDEPENDENT_AMBULATORY_CARE_PROVIDER_SITE_OTHER)
Admission: RE | Admit: 2014-12-19 | Discharge: 2014-12-19 | Disposition: A | Discharge status: Home / Self Care | Payer: Commercial Managed Care - HMO | Source: Ambulatory Visit | Attending: Internal Medicine | Admitting: Internal Medicine

## 2014-12-19 ENCOUNTER — Ambulatory Visit (INDEPENDENT_AMBULATORY_CARE_PROVIDER_SITE_OTHER): Payer: Commercial Managed Care - HMO | Admitting: Internal Medicine

## 2014-12-19 ENCOUNTER — Encounter: Payer: Self-pay | Admitting: Internal Medicine

## 2014-12-19 VITALS — BP 136/78 | HR 78 | Temp 98.5°F | Ht 64.0 in | Wt 160.0 lb

## 2014-12-19 DIAGNOSIS — H9193 Unspecified hearing loss, bilateral: Secondary | ICD-10-CM

## 2014-12-19 DIAGNOSIS — R7302 Impaired glucose tolerance (oral): Secondary | ICD-10-CM | POA: Diagnosis not present

## 2014-12-19 DIAGNOSIS — J449 Chronic obstructive pulmonary disease, unspecified: Secondary | ICD-10-CM | POA: Diagnosis not present

## 2014-12-19 DIAGNOSIS — R05 Cough: Secondary | ICD-10-CM | POA: Diagnosis not present

## 2014-12-19 DIAGNOSIS — Z720 Tobacco use: Secondary | ICD-10-CM

## 2014-12-19 DIAGNOSIS — R0602 Shortness of breath: Secondary | ICD-10-CM | POA: Diagnosis not present

## 2014-12-19 DIAGNOSIS — I10 Essential (primary) hypertension: Secondary | ICD-10-CM | POA: Diagnosis not present

## 2014-12-19 DIAGNOSIS — F17200 Nicotine dependence, unspecified, uncomplicated: Secondary | ICD-10-CM

## 2014-12-19 DIAGNOSIS — R059 Cough, unspecified: Secondary | ICD-10-CM | POA: Insufficient documentation

## 2014-12-19 MED ORDER — LEVOFLOXACIN 250 MG PO TABS: 250.0000 mg | ORAL_TABLET | Freq: Every day | ORAL | Status: DC

## 2014-12-19 NOTE — Assessment & Plan Note (Signed)
Improved with irrigation 

## 2014-12-19 NOTE — Progress Notes (Signed)
Pre visit review using our clinic review tool, if applicable. No additional management support is needed unless otherwise documented below in the visit note. 

## 2014-12-19 NOTE — Patient Instructions (Signed)
Please take all new medication as prescribed - the levaquin antibiotic  Please continue all other medications as before, and refills have been done if requested.  Please have the pharmacy call with any other refills you may need.  Please keep your appointments with your specialists as you may have planned  Please go to the XRAY Department in the Basement (go straight as you get off the elevator) for the x-ray testing  You will be contacted by phone if any changes need to be made immediately.  Otherwise, you will receive a letter about your results with an explanation, but please check with MyChart first.  Please remember to sign up for MyChart if you have not done so, as this will be important to you in the future with finding out test results, communicating by private email, and scheduling acute appointments online when needed.

## 2014-12-19 NOTE — Progress Notes (Signed)
Subjective:    Patient ID: [name], female    DOB: 25-Feb-1946, 69 y.o.   MRN: [id number]  HPI  Here for yearly f/u;  Overall doing ok;  Pt denies Chest pain, worsening SOB, DOE, wheezing, orthopnea, PND, worsening LE edema, palpitations, dizziness or syncope, though does have recent onset cough non prod x 3 wks with fever, not improving after 7 days augmentin and prednisone. No wheezes.    Pt denies neurological change such as new headache, facial or extremity weakness.  Pt denies polydipsia, polyuria, or low sugar symptoms. Pt states overall good compliance with treatment and medications, good tolerability, and has been trying to follow appropriate diet.  Pt denies worsening depressive symptoms, suicidal ideation or panic. No fever, night sweats, wt loss, loss of appetite, or other constitutional symptoms.  Pt states good ability with ADL's, has low fall risk, home safety reviewed and adequate, no other significant changes in hearing or vision, and only occasionally active with exercise. Has lost wt intentionally with now walking 3 miles per day Wt Readings from Last 3 Encounters:  12/19/14 160 lb (72.576 kg)  08/15/14 169 lb 6.4 oz (76.839 kg)  01/31/14 165 lb 6 oz (75.014 kg)  Now working on quitting smoking.  Has bilat ear reduced hearing with wax impactions? Asks for irrigation Past Medical History  Diagnosis Date  . BACK PAIN 03/14/2009  . COPD 11/01/2006  . GLUCOSE INTOLERANCE 03/14/2009  . Headache(784.0) 03/14/2009  . HYPERTENSION 11/01/2006  . 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 INSOMNIA, HX OF 01/06/2007  . Urinary frequency 03/14/2009  . Other and unspecified hyperlipidemia 08/22/2013   Past Surgical History  Procedure Laterality Date  . Tubal ligation    . Tonsillectomy    . Dilation and curettage of uterus    . Cardiac catheterization  2003    negative    reports that she has been smoking.  She has never used smokeless tobacco. She reports that she does not drink  alcohol or use illicit drugs. family history includes Congestive Heart Failure in her mother; Coronary artery disease in her other; Diabetes in her father and mother; Heart attack in her father; Hypertension in her mother. There is no history of Colon cancer. Allergies  Allergen Reactions  . Hydrochlorothiazide    Current Outpatient Prescriptions on File Prior to Visit  Medication Sig Dispense Refill  . amLODipine (NORVASC) 5 MG tablet Take 1 tablet (5 mg total) by mouth daily. 90 tablet 3  . aspirin 81 MG tablet Take 81 mg by mouth daily.      Marland Kitchen lisinopril (PRINIVIL,ZESTRIL) 20 MG tablet TAKE 1 TABLET EVERY DAY 90 tablet 3  . pravastatin (PRAVACHOL) 20 MG tablet TAKE 1 TABLET EVERY DAY 90 tablet 2  . temazepam (RESTORIL) 15 MG capsule TAKE  1 CAPSULES BY MOUTH AT BEDTIME AS NEEDED 90 capsule 1   No current facility-administered medications on file prior to visit.    Review of Systems Constitutional: Negative for increased diaphoresis, other activity, appetite or siginficant weight change other than noted HENT: Negative for worsening hearing loss, ear pain, facial swelling, mouth sores and neck stiffness.   Eyes: Negative for other worsening pain, redness or visual disturbance.  Respiratory: Negative for shortness of breath and wheezing  Cardiovascular: Negative for chest pain and palpitations.  Gastrointestinal: Negative for diarrhea, blood in stool, abdominal distention or other pain Genitourinary: Negative for hematuria, flank pain or change in urine volume.  Musculoskeletal: Negative for myalgias or other  joint complaints.  Skin: Negative for color change and wound or drainage.  Neurological: Negative for syncope and numbness. other than noted Hematological: Negative for adenopathy. or other swelling Psychiatric/Behavioral: Negative for hallucinations, SI, self-injury, decreased concentration or other worsening agitation.      Objective:   Physical Exam BP 136/78 mmHg  Pulse 78   Temp(Src) 98.5 F (36.9 C) (Oral)  Ht  (1.626 m)  Wt 160 lb (72.576 kg)  BMI 27.45 kg/m2  SpO2 97%  LMP 03/29/1993 VS noted, mild ill  Constitutional: Pt is oriented to person, place, and time. Appears well-developed and well-nourished, in no significant distress Head: Normocephalic and atraumatic.  Right Ear: External ear normal.  Left Ear: External ear normal.  Nose: Nose normal.  Mouth/Throat: Oropharynx is clear and moist.  Bilat tm's with mild erythema.  Max sinus areas non tender.  Pharynx with mild erythema, no exudate Eyes: Conjunctivae and EOM are normal. Pupils are equal, round, and reactive to light.  Neck: Normal range of motion. Neck supple. No JVD present. No tracheal deviation present or significant neck LA or mass Cardiovascular: Normal rate, regular rhythm, normal heart sounds and intact distal pulses.   Pulmonary/Chest: Effort normal and breath sounds without rales or wheezing  Abdominal: Soft. Bowel sounds are normal. NT. No HSM  Musculoskeletal: Normal range of motion. Exhibits no edema.  Lymphadenopathy:  Has no cervical adenopathy.  Neurological: Pt is alert and oriented to person, place, and time. Pt has normal reflexes. No cranial nerve deficit. Motor grossly intact Skin: Skin is warm and dry. No rash noted.  Psychiatric:  Has normal mood and affect. Behavior is normal.     Assessment & Plan:

## 2014-12-20 ENCOUNTER — Encounter: Payer: Self-pay | Admitting: Internal Medicine

## 2014-12-21 NOTE — Assessment & Plan Note (Signed)
Urged to quit 

## 2014-12-21 NOTE — Assessment & Plan Note (Signed)
stable overall by history and exam, recent data reviewed with pt, and pt to continue medical treatment as before,  to f/u any worsening symptoms or concerns BP Readings from Last 3 Encounters:  12/19/14 136/78  08/15/14 118/76  01/31/14 144/90

## 2014-12-21 NOTE — Assessment & Plan Note (Addendum)
?   Bronchitis vs pna, for cxr, empiric levaquin asd,  to f/u any worsening symptoms or concerns,  Note:  Total time for pt hx, exam, review of record with pt in the room, determination of diagnoses and plan for further eval and tx is > 40 min, with over 50% spent in coordination and counseling of patient

## 2014-12-21 NOTE — Assessment & Plan Note (Signed)
stable overall by history and exam, recent data reviewed with pt, and pt to continue medical treatment as before,  to f/u any worsening symptoms or concerns Lab Results  Component Value Date   HGBA1C 6.0 08/15/2014    

## 2014-12-21 NOTE — Assessment & Plan Note (Signed)
stable overall by history and exam, recent data reviewed with pt, and pt to continue medical treatment as before,  to f/u any worsening symptoms or concerns SpO2 Readings from Last 3 Encounters:  12/19/14 97%  08/15/14 99%  01/31/14 97%

## 2014-12-25 DIAGNOSIS — H5210 Myopia, unspecified eye: Secondary | ICD-10-CM | POA: Diagnosis not present

## 2014-12-28 DIAGNOSIS — H5203 Hypermetropia, bilateral: Secondary | ICD-10-CM | POA: Diagnosis not present

## 2014-12-28 DIAGNOSIS — H524 Presbyopia: Secondary | ICD-10-CM | POA: Diagnosis not present

## 2014-12-28 DIAGNOSIS — Z0100 Encounter for examination of eyes and vision without abnormal findings: Secondary | ICD-10-CM | POA: Diagnosis not present

## 2014-12-28 DIAGNOSIS — H52209 Unspecified astigmatism, unspecified eye: Secondary | ICD-10-CM | POA: Diagnosis not present

## 2015-02-11 ENCOUNTER — Ambulatory Visit (INDEPENDENT_AMBULATORY_CARE_PROVIDER_SITE_OTHER): Payer: Commercial Managed Care - HMO | Admitting: Internal Medicine

## 2015-02-11 ENCOUNTER — Telehealth: Payer: Self-pay | Admitting: Internal Medicine

## 2015-02-11 ENCOUNTER — Encounter: Payer: Self-pay | Admitting: Internal Medicine

## 2015-02-11 VITALS — BP 120/78 | HR 85 | Temp 98.3°F | Ht 64.0 in | Wt 163.0 lb

## 2015-02-11 DIAGNOSIS — E785 Hyperlipidemia, unspecified: Secondary | ICD-10-CM | POA: Diagnosis not present

## 2015-02-11 DIAGNOSIS — L301 Dyshidrosis [pompholyx]: Secondary | ICD-10-CM

## 2015-02-11 DIAGNOSIS — R7302 Impaired glucose tolerance (oral): Secondary | ICD-10-CM

## 2015-02-11 DIAGNOSIS — I10 Essential (primary) hypertension: Secondary | ICD-10-CM | POA: Diagnosis not present

## 2015-02-11 DIAGNOSIS — J449 Chronic obstructive pulmonary disease, unspecified: Secondary | ICD-10-CM

## 2015-02-11 DIAGNOSIS — Z0000 Encounter for general adult medical examination without abnormal findings: Secondary | ICD-10-CM

## 2015-02-11 DIAGNOSIS — Z0189 Encounter for other specified special examinations: Secondary | ICD-10-CM

## 2015-02-11 MED ORDER — AMLODIPINE BESYLATE 5 MG PO TABS: 5.0000 mg | ORAL_TABLET | Freq: Every day | ORAL | Status: DC

## 2015-02-11 MED ORDER — PRAVASTATIN SODIUM 20 MG PO TABS: 20.0000 mg | ORAL_TABLET | Freq: Every day | ORAL | Status: DC

## 2015-02-11 MED ORDER — LISINOPRIL 20 MG PO TABS
20.0000 mg | ORAL_TABLET | Freq: Every day | ORAL | Status: DC
Start: 2015-02-11 — End: 2015-08-12

## 2015-02-11 MED ORDER — TEMAZEPAM 15 MG PO CAPS: ORAL_CAPSULE | ORAL | Status: DC

## 2015-02-11 MED ORDER — TRIAMCINOLONE ACETONIDE 0.1 % EX CREA: 1.0000 "application " | TOPICAL_CREAM | Freq: Two times a day (BID) | CUTANEOUS | Status: DC

## 2015-02-11 NOTE — Patient Instructions (Signed)
Please continue all other medications as before, and refills have been done if requested. - the cream  Please have the pharmacy call with any other refills you may need.  Please continue your efforts at being more active, low cholesterol diet, and weight control.  Please keep your appointments with your specialists as you may have planned  Please return in 6 months, or sooner if needed, with Lab testing done 3-5 days before

## 2015-02-11 NOTE — Assessment & Plan Note (Signed)
stable overall by history and exam, recent data reviewed with pt, and pt to continue medical treatment as before,  to f/u any worsening symptoms or concerns BP Readings from Last 3 Encounters:  02/11/15 120/78  12/19/14 136/78  08/15/14 118/76

## 2015-02-11 NOTE — Telephone Encounter (Signed)
Hard copy Rx retrieved from patient and faxed to [facility]

## 2015-02-11 NOTE — Assessment & Plan Note (Signed)
stable overall by history and exam, recent data reviewed with pt, and pt to continue medical treatment as before,  to f/u any worsening symptoms or concerns Lab Results  Component Value Date   LDLCALC 72 08/15/2014

## 2015-02-11 NOTE — Assessment & Plan Note (Signed)
stable overall by history and exam, recent data reviewed with pt, and pt to continue medical treatment as before,  to f/u any worsening symptoms or concerns Lab Results  Component Value Date   HGBA1C 6.0 08/15/2014

## 2015-02-11 NOTE — Assessment & Plan Note (Signed)
Ok for triam cr prn,  to f/u any worsening symptoms or concerns 

## 2015-02-11 NOTE — Progress Notes (Signed)
Pre visit review using our clinic review tool, if applicable. No additional management support is needed unless otherwise documented below in the visit note. 

## 2015-02-11 NOTE — Telephone Encounter (Signed)
Pt was given a hard copy of the prescription for temazepam (RESTORIL) 15 MG capsule [[id number]. She needs [facility] to be [facility] to [facility], please

## 2015-02-11 NOTE — Assessment & Plan Note (Signed)
stable overall by history and exam, recent data reviewed with pt, and pt to continue medical treatment as before,  to f/u any worsening symptoms or concerns SpO2 Readings from Last 3 Encounters:  02/11/15 97%  12/19/14 97%  08/15/14 99%

## 2015-02-11 NOTE — Progress Notes (Signed)
   Subjective:    Patient ID: Leah Mata, female    DOB: 11/09/1945, 69 y.o.   MRN: [id number]  HPI  Here to f/u; overall doing ok,  Pt denies chest pain, increasing sob or doe, wheezing, orthopnea, PND, increased LE swelling, palpitations, dizziness or syncope.  Pt denies new neurological symptoms such as new headache, or facial or extremity weakness or numbness.  Pt denies polydipsia, polyuria, or low sugar episode.   Pt denies new neurological symptoms such as new headache, or facial or extremity weakness or numbness.   Pt states overall good compliance with meds, mostly trying to follow appropriate diet, with wt overall stable,  but little exercise however. Still smoking 3 cigs per day, trying to quit. Needs med refills. No acute complaints Past Medical History  Diagnosis Date  . BACK PAIN 03/14/2009  . COPD 11/01/2006  . GLUCOSE INTOLERANCE 03/14/2009  . Headache(784.0) 03/14/2009  . HYPERTENSION 11/01/2006  . 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 INSOMNIA, HX OF 01/06/2007  . Urinary frequency 03/14/2009  . Other and unspecified hyperlipidemia 08/22/2013   Past Surgical History  Procedure Laterality Date  . Tubal ligation    . Tonsillectomy    . Dilation and curettage of uterus    . Cardiac catheterization  2003    negative    reports that she has been smoking.  She has never used smokeless tobacco. She reports that she does not drink alcohol or use illicit drugs. family history includes Congestive Heart Failure in her mother; Coronary artery disease in her other; Diabetes in her father and mother; Heart attack in her father; Hypertension in her mother. There is no history of Colon cancer. Allergies  Allergen Reactions  . Hydrochlorothiazide    Current Outpatient Prescriptions on File Prior to Visit  Medication Sig Dispense Refill  . aspirin 81 MG tablet Take 81 mg by mouth daily.       No current facility-administered medications on file prior to visit.    Review of  Systems  Constitutional: Negative for unusual diaphoresis or night sweats HENT: Negative for ringing in ear or discharge Eyes: Negative for double vision or worsening visual disturbance.  Respiratory: Negative for choking and stridor.   Gastrointestinal: Negative for vomiting or other signifcant bowel change Genitourinary: Negative for hematuria or change in urine volume.  Musculoskeletal: Negative for other MSK pain or swelling Skin: Negative for color change and worsening wound.  Neurological: Negative for tremors and numbness other than noted  Psychiatric/Behavioral: Negative for decreased concentration or agitation other than above       Objective:   Physical Exam BP 120/78 mmHg  Pulse 85  Temp(Src) 98.3 F (36.8 C) (Oral)  Ht 5\' 4"  (1.626 m)  Wt 163 lb (73.936 kg)  BMI 27.97 kg/m2  SpO2 97%  LMP 03/29/1993 VS noted,  Constitutional: Pt appears in no significant distress HENT: Head: NCAT.  Right Ear: External ear normal.  Left Ear: External ear normal.  Eyes: . Pupils are equal, round, and reactive to light. Conjunctivae and EOM are normal Neck: Normal range of motion. Neck supple.  Cardiovascular: Normal rate and regular rhythm.   Pulmonary/Chest: Effort normal and breath sounds without rales or wheezing.  Neurological: Pt is alert. Not confused , motor grossly intact Skin: Skin is warm. No rash, no LE edema, + fingertip dyshidrosis Psychiatric: Pt behavior is normal. No agitation.      Assessment & Plan:

## 2015-02-13 ENCOUNTER — Ambulatory Visit (INDEPENDENT_AMBULATORY_CARE_PROVIDER_SITE_OTHER): Payer: Commercial Managed Care - HMO | Admitting: Obstetrics & Gynecology

## 2015-02-13 ENCOUNTER — Encounter: Payer: Self-pay | Admitting: Obstetrics & Gynecology

## 2015-02-13 VITALS — BP 134/82 | HR 80 | Ht 63.25 in | Wt 160.0 lb

## 2015-02-13 DIAGNOSIS — Z01419 Encounter for gynecological examination (general) (routine) without abnormal findings: Secondary | ICD-10-CM | POA: Diagnosis not present

## 2015-02-13 NOTE — Progress Notes (Signed)
Patient ID: Leah Mata, female   DOB: 1945-08-27, 69 y.o.   MRN: [id number]   69 y.o. G2P0002 WidowedCaucasianF here for annual exam.   Doing well.  Has lost 14# this year.  Got up to 174#.  No vaginal bleeding.    PCP:  Blood work planned with Dr. Jonny Ruiz.    Patient's last menstrual period was 03/29/1993 (approximate).          Sexually active: No.  The current method of family planning is abstinence.    Exercising: Yes.    walking Smoker:  Yes, 6 cigarettes per day  Health Maintenance: Pap: 01/31/14, Negative.  Neg pap with neg HPV 2013. History of abnormal Pap: no MMG:08/01/14 with 3D Left Diagnostic on 08/14/14; Bi-Rads 1:  Negative, screening in one year Colonoscopy: 4/14-repeat in 10 years.  Neg.  Dr. Juanda Chance. BMD: 11/26/10 TDap: 11/28/06 Screening Labs: PCP, Hb today: PCP, Urine today: PCP   reports that she has been smoking.  She has never used smokeless tobacco. She reports that she does not drink alcohol or use illicit drugs.  Past Medical History  Diagnosis Date  . BACK PAIN 03/14/2009  . COPD 11/01/2006  . GLUCOSE INTOLERANCE 03/14/2009  . Headache(784.0) 03/14/2009  . HYPERTENSION 11/01/2006  . 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 INSOMNIA, HX OF 01/06/2007  . Urinary frequency 03/14/2009  . Other and unspecified hyperlipidemia 08/22/2013    Past Surgical History  Procedure Laterality Date  . Tubal ligation    . Tonsillectomy    . Dilation and curettage of uterus    . Cardiac catheterization  2003    negative    Current Outpatient Prescriptions  Medication Sig Dispense Refill  . amLODipine (NORVASC) 5 MG tablet Take 1 tablet (5 mg total) by mouth daily. 90 tablet 3  . aspirin 81 MG tablet Take 81 mg by mouth daily.      Marland Kitchen lisinopril (PRINIVIL,ZESTRIL) 20 MG tablet Take 1 tablet (20 mg total) by mouth daily. 90 tablet 3  . pravastatin (PRAVACHOL) 20 MG tablet Take 1 tablet (20 mg total) by mouth daily. 90 tablet 3  . temazepam (RESTORIL) 15 MG capsule TAKE   1 CAPSULES BY MOUTH AT BEDTIME AS NEEDED 90 capsule 1  . triamcinolone cream (KENALOG) 0.1 % Apply 1 application topically 2 (two) times daily. 30 g 0   No current facility-administered medications for this visit.    Family History  Problem Relation Age of Onset  . Coronary artery disease Other     female 1st degree relative <60 and female 1st degree relative <50  . Colon cancer Neg Hx   . Diabetes Father   . Diabetes Mother   . Hypertension Mother   . Congestive Heart Failure Mother   . Heart attack Father     ROS:  Pertinent items are noted in HPI.  Otherwise, a comprehensive ROS was negative.  Exam:   BP 134/82 mmHg  Pulse 80  Ht 5' 3.25" (1.607 m)  Wt 160 lb (72.576 kg)  BMI 28.10 kg/m2  LMP 03/29/1993 (Approximate)  Weight change: -6#  Height: 5' 3.25" (160.7 cm)  Ht Readings from Last 3 Encounters:  02/13/15 5' 3.25" (1.607 m)  02/11/15  (1.626 m)  12/19/14  (1.626 m)    General appearance: alert, cooperative and appears stated age Head: Normocephalic, without obvious abnormality, atraumatic Neck: no adenopathy, supple, symmetrical, trachea midline and thyroid normal to inspection and palpation Lungs: clear to auscultation bilaterally Breasts: normal  appearance, no masses or tenderness Heart: regular rate and rhythm Abdomen: soft, non-tender; bowel sounds normal; no masses,  no organomegaly Extremities: extremities normal, atraumatic, no cyanosis or edema Skin: Skin color, texture, turgor normal. No rashes or lesions Lymph nodes: Cervical, supraclavicular, and axillary nodes normal. No abnormal inguinal nodes palpated Neurologic: Grossly normal   Pelvic: External genitalia:  no lesions              Urethra:  normal appearing urethra with no masses, tenderness or lesions              Bartholins and Skenes: normal                 Vagina: normal appearing vagina with normal color and discharge, no lesions              Cervix: no lesions              Pap  taken: No. Bimanual Exam:  Uterus:  normal size, contour, position, consistency, mobility, non-tender              Adnexa: normal adnexa and no mass, fullness, tenderness               Rectovaginal: Confirms               Anus:  normal sphincter tone, no lesions  Chaperone was present for exam.  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 neg pap last year.  No pap today. Labs/vaccines with PCP  return annually or prn

## 2015-07-24 DIAGNOSIS — M79671 Pain in right foot: Secondary | ICD-10-CM | POA: Diagnosis not present

## 2015-07-24 DIAGNOSIS — M722 Plantar fascial fibromatosis: Secondary | ICD-10-CM | POA: Diagnosis not present

## 2015-08-12 ENCOUNTER — Encounter: Payer: Self-pay | Admitting: Internal Medicine

## 2015-08-12 ENCOUNTER — Ambulatory Visit (INDEPENDENT_AMBULATORY_CARE_PROVIDER_SITE_OTHER): Payer: Commercial Managed Care - HMO | Admitting: Internal Medicine

## 2015-08-12 ENCOUNTER — Other Ambulatory Visit (INDEPENDENT_AMBULATORY_CARE_PROVIDER_SITE_OTHER): Payer: Commercial Managed Care - HMO

## 2015-08-12 ENCOUNTER — Telehealth: Payer: Self-pay

## 2015-08-12 VITALS — BP 138/78 | HR 79 | Temp 98.5°F | Resp 20 | Wt 163.0 lb

## 2015-08-12 DIAGNOSIS — Z0001 Encounter for general adult medical examination with abnormal findings: Secondary | ICD-10-CM

## 2015-08-12 DIAGNOSIS — R7302 Impaired glucose tolerance (oral): Secondary | ICD-10-CM | POA: Diagnosis not present

## 2015-08-12 DIAGNOSIS — I10 Essential (primary) hypertension: Secondary | ICD-10-CM

## 2015-08-12 DIAGNOSIS — J449 Chronic obstructive pulmonary disease, unspecified: Secondary | ICD-10-CM

## 2015-08-12 DIAGNOSIS — J209 Acute bronchitis, unspecified: Secondary | ICD-10-CM

## 2015-08-12 DIAGNOSIS — R6889 Other general symptoms and signs: Secondary | ICD-10-CM | POA: Diagnosis not present

## 2015-08-12 DIAGNOSIS — E785 Hyperlipidemia, unspecified: Secondary | ICD-10-CM

## 2015-08-12 LAB — HEPATIC FUNCTION PANEL
ALBUMIN: 4 g/dL (ref 3.5–5.2)
ALK PHOS: 88 U/L (ref 39–117)
ALT: 10 U/L (ref 0–35)
AST: 14 U/L (ref 0–37)
Bilirubin, Direct: 0.1 mg/dL (ref 0.0–0.3)
TOTAL PROTEIN: 6.8 g/dL (ref 6.0–8.3)
Total Bilirubin: 0.6 mg/dL (ref 0.2–1.2)

## 2015-08-12 LAB — CBC WITH DIFFERENTIAL/PLATELET
BASOS ABS: 0 10*3/uL (ref 0.0–0.1)
Basophils Relative: 0.5 % (ref 0.0–3.0)
EOS ABS: 0.2 10*3/uL (ref 0.0–0.7)
Eosinophils Relative: 2.4 % (ref 0.0–5.0)
HCT: 40.5 % (ref 36.0–46.0)
Hemoglobin: 13.8 g/dL (ref 12.0–15.0)
LYMPHS ABS: 2.7 10*3/uL (ref 0.7–4.0)
Lymphocytes Relative: 30.3 % (ref 12.0–46.0)
MCHC: 34 g/dL (ref 30.0–36.0)
MCV: 92.5 fl (ref 78.0–100.0)
MONO ABS: 0.8 10*3/uL (ref 0.1–1.0)
Monocytes Relative: 9.6 % (ref 3.0–12.0)
NEUTROS PCT: 57.2 % (ref 43.0–77.0)
Neutro Abs: 5 10*3/uL (ref 1.4–7.7)
Platelets: 203 10*3/uL (ref 150.0–400.0)
RBC: 4.37 Mil/uL (ref 3.87–5.11)
RDW: 13.4 % (ref 11.5–15.5)
WBC: 8.8 10*3/uL (ref 4.0–10.5)

## 2015-08-12 LAB — URINALYSIS, ROUTINE W REFLEX MICROSCOPIC
Bilirubin Urine: NEGATIVE
KETONES UR: NEGATIVE
LEUKOCYTES UA: NEGATIVE
Nitrite: NEGATIVE
Specific Gravity, Urine: 1.02 (ref 1.000–1.030)
TOTAL PROTEIN, URINE-UPE24: NEGATIVE
URINE GLUCOSE: NEGATIVE
UROBILINOGEN UA: 0.2 (ref 0.0–1.0)
WBC, UA: NONE SEEN (ref 0–?)
pH: 6 (ref 5.0–8.0)

## 2015-08-12 LAB — BASIC METABOLIC PANEL
BUN: 12 mg/dL (ref 6–23)
CO2: 28 meq/L (ref 19–32)
Calcium: 9.9 mg/dL (ref 8.4–10.5)
Chloride: 102 mEq/L (ref 96–112)
Creatinine, Ser: 0.85 mg/dL (ref 0.40–1.20)
GFR: 70.62 mL/min (ref >60.00)
GLUCOSE: 124 mg/dL — AB (ref 70–99)
POTASSIUM: 4.1 meq/L (ref 3.5–5.1)
SODIUM: 139 meq/L (ref 135–145)

## 2015-08-12 LAB — TSH: TSH: 0.63 u[IU]/mL (ref 0.35–4.50)

## 2015-08-12 LAB — HEMOGLOBIN A1C: Hgb A1c MFr Bld: 6.3 % (ref 4.6–6.5)

## 2015-08-12 LAB — LIPID PANEL
CHOLESTEROL: 139 mg/dL (ref 0–200)
HDL: 43.6 mg/dL (ref >39.00)
LDL Cholesterol: 59 mg/dL (ref 0–99)
NonHDL: 95.22
TRIGLYCERIDES: 182 mg/dL — AB (ref 0.0–149.0)
Total CHOL/HDL Ratio: 3
VLDL: 36.4 mg/dL (ref 0.0–40.0)

## 2015-08-12 MED ORDER — HYDROCODONE-HOMATROPINE 5-1.5 MG/5ML PO SYRP: 5.0000 mL | ORAL_SOLUTION | Freq: Four times a day (QID) | ORAL | Status: DC | PRN

## 2015-08-12 MED ORDER — TEMAZEPAM 15 MG PO CAPS: ORAL_CAPSULE | ORAL | Status: DC

## 2015-08-12 MED ORDER — AMLODIPINE BESYLATE 5 MG PO TABS: 5.0000 mg | ORAL_TABLET | Freq: Every day | ORAL | Status: DC

## 2015-08-12 MED ORDER — LISINOPRIL 20 MG PO TABS: 20.0000 mg | ORAL_TABLET | Freq: Every day | ORAL | Status: DC

## 2015-08-12 MED ORDER — TRIAMCINOLONE ACETONIDE 0.1 % EX CREA: 1.0000 "application " | TOPICAL_CREAM | Freq: Two times a day (BID) | CUTANEOUS | Status: DC

## 2015-08-12 MED ORDER — PRAVASTATIN SODIUM 20 MG PO TABS: 20.0000 mg | ORAL_TABLET | Freq: Every day | ORAL | Status: DC

## 2015-08-12 NOTE — Assessment & Plan Note (Signed)
Minor last visit, for PTH level,  to f/u any worsening symptoms or concerns

## 2015-08-12 NOTE — Assessment & Plan Note (Signed)
Improved last yr, to cont diet, o/w stable overall by history and exam, recent data reviewed with pt, and pt to continue medical treatment as before,  to f/u any worsening symptoms or concerns Lab Results  Component Value Date   LDLCALC 72 08/15/2014

## 2015-08-12 NOTE — Assessment & Plan Note (Signed)
stable overall by history and exam, recent data reviewed with pt, and pt to continue medical treatment as before,  to f/u any worsening symptoms or concerns Lab Results  Component Value Date   HGBA1C 6.0 08/15/2014    

## 2015-08-12 NOTE — Progress Notes (Signed)
Subjective:    Patient ID: Leah Mata, female    DOB: 28-Oct-1945, 70 y.o.   MRN: [id number]  HPI  Here for wellness and f/u;  Overall doing ok;  Pt denies Chest pain, worsening SOB, DOE, wheezing, orthopnea, PND, worsening LE edema, palpitations, dizziness or syncope.  Pt denies neurological change such as new headache, facial or extremity weakness.  Pt denies polydipsia, polyuria, or low sugar symptoms. Pt states overall good compliance with treatment and medications, good tolerability, and has been trying to follow appropriate diet.  Pt denies worsening depressive symptoms, suicidal ideation or panic. No fever, night sweats, wt loss, loss of appetite, or other constitutional symptoms.  Pt states good ability with ADL's, has low fall risk, home safety reviewed and adequate, no other significant changes in hearing or vision, and only occasionally active with exercise.  Incidentally with chest cold with midl prod cough and congestion, white sputum, seems improved today, walked for exercise last PM and did well.  Cant sleep due to cough however.  No hypercalcemia symptoms.   Pt denies polydipsia, polyuria,wt overall stable.    Also still with persistent insomnia problem as well, just cant get to sleep most night, Denies worsening depressive symptoms, suicidal ideation, or panic; Past Medical History  Diagnosis Date  . BACK PAIN 03/14/2009  . COPD 11/01/2006  . GLUCOSE INTOLERANCE 03/14/2009  . Headache(784.0) 03/14/2009  . HYPERTENSION 11/01/2006  . 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 INSOMNIA, HX OF 01/06/2007  . Urinary frequency 03/14/2009  . Other and unspecified hyperlipidemia 08/22/2013   Past Surgical History  Procedure Laterality Date  . Tubal ligation    . Tonsillectomy    . Dilation and curettage of uterus    . Cardiac catheterization  2003    negative    reports that she has been smoking.  She has never used smokeless tobacco. She reports that she does not drink alcohol or  use illicit drugs. family history includes Congestive Heart Failure in her mother; Coronary artery disease in her other; Diabetes in her father and mother; Heart attack in her father; Hypertension in her mother. There is no history of Colon cancer. Allergies  Allergen Reactions  . Hydrochlorothiazide    Current Outpatient Prescriptions on File Prior to Visit  Medication Sig Dispense Refill  . amLODipine (NORVASC) 5 MG tablet Take 1 tablet (5 mg total) by mouth daily. 90 tablet 3  . aspirin 81 MG tablet Take 81 mg by mouth daily.      Marland Kitchen lisinopril (PRINIVIL,ZESTRIL) 20 MG tablet Take 1 tablet (20 mg total) by mouth daily. 90 tablet 3  . pravastatin (PRAVACHOL) 20 MG tablet Take 1 tablet (20 mg total) by mouth daily. 90 tablet 3  . triamcinolone cream (KENALOG) 0.1 % Apply 1 application topically 2 (two) times daily. 30 g 0   No current facility-administered medications on file prior to visit.    Review of Systems Constitutional: Negative for increased diaphoresis, or other activity, appetite or siginficant weight change other than noted HENT: Negative for worsening hearing loss, ear pain, facial swelling, mouth sores and neck stiffness.   Eyes: Negative for other worsening pain, redness or visual disturbance.  Respiratory: Negative for choking or stridor Cardiovascular: Negative for other chest pain and palpitations.  Gastrointestinal: Negative for worsening diarrhea, blood in stool, or abdominal distention Genitourinary: Negative for hematuria, flank pain or change in urine volume.  Musculoskeletal: Negative for myalgias or other joint complaints.  Skin: Negative for other color  change and wound or drainage.  Neurological: Negative for syncope and numbness. other than noted Hematological: Negative for adenopathy. or other swelling Psychiatric/Behavioral: Negative for hallucinations, SI, self-injury, decreased concentration or other worsening agitation.      Objective:   Physical  Exam BP 138/78 mmHg  Pulse 79  Temp(Src) 98.5 F (36.9 C) (Oral)  Resp 20  Wt 163 lb (73.936 kg)  SpO2 97%  LMP 03/29/1993 (Approximate) VS noted,  Constitutional: Pt is oriented to person, place, and time. Appears well-developed and well-nourished, in no significant distress Head: Normocephalic and atraumatic  Eyes: Conjunctivae and EOM are normal. Pupils are equal, round, and reactive to light Right Ear: External ear normal.  Left Ear: External ear normal Nose: Nose normal.  Mouth/Throat: Oropharynx is clear and moist  Neck: Normal range of motion. Neck supple. No JVD present. No tracheal deviation present or significant neck LA or mass Cardiovascular: Normal rate, regular rhythm, normal heart sounds and intact distal pulses.   Pulmonary/Chest: Effort normal and breath sounds without rales or wheezing  Abdominal: Soft. Bowel sounds are normal. NT. No HSM  Musculoskeletal: Normal range of motion. Exhibits no edema Lymphadenopathy: Has no cervical adenopathy.  Neurological: Pt is alert and oriented to person, place, and time. Pt has normal reflexes. No cranial nerve deficit. Motor grossly intact Skin: Skin is warm and dry. No rash noted or new ulcers Psychiatric:  Has normal mood and affect. Behavior is normal.     Assessment & Plan:

## 2015-08-12 NOTE — Assessment & Plan Note (Signed)
stable overall by history and exam, recent data reviewed with pt, and pt to continue medical treatment as before,  to f/u any worsening symptoms or concerns BP Readings from Last 3 Encounters:  08/12/15 138/78  02/13/15 134/82  02/11/15 120/78

## 2015-08-12 NOTE — Patient Instructions (Signed)
Please take all new medication as prescribed - the cough medicine as needed  Please continue all other medications as before, and refills have been done if requested, the temazepam and other  Please have the pharmacy call with any other refills you may need.  Please continue your efforts at being more active, low cholesterol diet, and weight control.  You are otherwise up to date with prevention measures today.  Please keep your appointments with your specialists as you may have planned  Please go to the LAB in the Basement (turn left off the elevator) for the tests to be done today  You will be contacted by phone if any changes need to be made immediately.  Otherwise, you will receive a letter about your results with an explanation, but please check with MyChart first.  Please remember to sign up for MyChart if you have not done so, as this will be important to you in the future with finding out test results, communicating by private email, and scheduling acute appointments online when needed.  Please return in 6 months, or sooner if needed

## 2015-08-12 NOTE — Assessment & Plan Note (Addendum)
With current mild likely vrial bronchitis symptoms, improving it seems, for cough med prn,  to f/u any worsening symptoms or concerns  In addition to the time spent performing CPE, I spent an additional 40 minutes face to face,in which greater than 50% of this time was spent in counseling and coordination of care for patient's acute illness as documented.

## 2015-08-12 NOTE — Assessment & Plan Note (Signed)

## 2015-08-12 NOTE — Progress Notes (Signed)
Pre visit review using our clinic review tool, if applicable. No additional management support is needed unless otherwise documented below in the visit note. 

## 2015-08-12 NOTE — Telephone Encounter (Signed)
Medication refills sent to pharmacy 

## 2015-08-13 LAB — PTH, INTACT AND CALCIUM
CALCIUM: 9.9 mg/dL (ref 8.4–10.5)
PTH: 60 pg/mL (ref 14–64)

## 2015-08-19 DIAGNOSIS — M722 Plantar fascial fibromatosis: Secondary | ICD-10-CM | POA: Diagnosis not present

## 2015-08-27 ENCOUNTER — Telehealth: Payer: Self-pay | Admitting: Internal Medicine

## 2015-08-27 NOTE — Telephone Encounter (Signed)
[employer] sent her a letter stating that hey need per scriber authorization for Temazepam 15 mg. Please help

## 2015-08-28 NOTE — Telephone Encounter (Signed)
Ok to forward to Nucor CorporationDahlia

## 2015-08-28 NOTE — Telephone Encounter (Signed)
PA not required for this medication per pt [employer].  Pt advised of same via personal VM and asked to call back if any further questions

## 2015-09-11 ENCOUNTER — Telehealth: Payer: Self-pay | Admitting: Internal Medicine

## 2015-10-14 ENCOUNTER — Other Ambulatory Visit: Payer: Self-pay | Admitting: Obstetrics & Gynecology

## 2015-10-14 DIAGNOSIS — Z1231 Encounter for screening mammogram for malignant neoplasm of breast: Secondary | ICD-10-CM

## 2015-10-15 ENCOUNTER — Telehealth: Payer: Self-pay | Admitting: *Deleted

## 2015-10-15 NOTE — Telephone Encounter (Signed)
Left msg on triage requesting refill on her zolpidem.../lmb 

## 2015-10-15 NOTE — Telephone Encounter (Signed)
Unfortunately this is not possible as a refill as pt has not been prescribed this before per EMR

## 2015-10-17 NOTE — Telephone Encounter (Signed)
Called pt she states she is taking Temazepam not zolpidem she had tried zolpidem before but it doesn't help...Raechel Chute/lmb

## 2015-10-17 NOTE — Telephone Encounter (Signed)
Please advise, patient is stating that they need a PA for the medication temazepam.

## 2015-10-17 NOTE — Telephone Encounter (Signed)
This would be too soon, as last rx was just done may 6 for #90 with 1 refill (6 mo)

## 2015-10-22 ENCOUNTER — Ambulatory Visit
Admission: RE | Admit: 2015-10-22 | Discharge: 2015-10-22 | Disposition: A | Discharge status: Home / Self Care | Payer: Commercial Managed Care - HMO | Source: Ambulatory Visit | Attending: Obstetrics & Gynecology | Admitting: Obstetrics & Gynecology

## 2015-10-22 ENCOUNTER — Telehealth: Payer: Self-pay | Admitting: Internal Medicine

## 2015-10-22 DIAGNOSIS — Z1231 Encounter for screening mammogram for malignant neoplasm of breast: Secondary | ICD-10-CM

## 2015-10-22 MED ORDER — TEMAZEPAM 15 MG PO CAPS: ORAL_CAPSULE | ORAL | 1 refills | Status: DC

## 2015-10-22 NOTE — Telephone Encounter (Signed)
This medication is available without prior authorization per [facility], please verify that Rx fax was received by mail order pharmacy

## 2015-10-22 NOTE — Telephone Encounter (Signed)
Contacted [employer] at [phone] and was informed that Rx dated 08/12/2015 was discontinued because additional information was required but not received from office. Verbal authorization given for Temazepam 15 mg cap 1 po qhs #90 x 1

## 2015-10-22 NOTE — Telephone Encounter (Signed)
Patient walked in today to request a refill for temazepam (RESTORIL) 15 MG capsule [[id number]]  . There seems to be some confusion per the last phone note:   I have a letter from [place] dated 08/21/2015 stating that clarification is needed for temazepam (RESTORIL) 15 MG capsule [[id number]]   rx dated 08/12/2015. She has not been able to get her rx from that date yet. I am making a copy and placing in dahlias inbox. Please follow up with the patient on her cell #

## 2016-01-27 ENCOUNTER — Ambulatory Visit (INDEPENDENT_AMBULATORY_CARE_PROVIDER_SITE_OTHER): Payer: Commercial Managed Care - HMO | Admitting: Adult Health

## 2016-01-27 ENCOUNTER — Encounter: Payer: Self-pay | Admitting: Adult Health

## 2016-01-27 VITALS — BP 118/82 | Ht 63.25 in | Wt 163.0 lb

## 2016-01-27 DIAGNOSIS — R42 Dizziness and giddiness: Secondary | ICD-10-CM | POA: Diagnosis not present

## 2016-01-27 MED ORDER — ONDANSETRON HCL 4 MG PO TABS: 4.0000 mg | ORAL_TABLET | Freq: Three times a day (TID) | ORAL | 0 refills | Status: DC | PRN

## 2016-01-27 MED ORDER — ONDANSETRON HCL 4 MG/2ML IJ SOLN
4.0000 mg | Freq: Once | INTRAMUSCULAR | Status: AC
  Administered 2016-01-27: 4 mg via INTRAMUSCULAR

## 2016-01-27 MED ORDER — MECLIZINE HCL 12.5 MG PO TABS: 12.5000 mg | ORAL_TABLET | Freq: Three times a day (TID) | ORAL | 0 refills | Status: DC | PRN

## 2016-01-27 NOTE — Progress Notes (Signed)
   Subjective:    Patient ID: Leah Mata, female    DOB: 09/13/1945, 70 y.o.   MRN: [id number]  Dizziness  This is a new problem. The current episode started in the past 7 days. The problem has been gradually worsening. Associated symptoms include anorexia, fatigue, nausea, vertigo, a visual change and vomiting. Pertinent negatives include no abdominal pain, chest pain, congestion, coughing, diaphoresis, fever, headaches, numbness, rash, sore throat, urinary symptoms or weakness. The symptoms are aggravated by bending, exertion, standing, walking and twisting. She has tried lying down for the symptoms. The treatment provided mild relief.      Review of Systems  Constitutional: Positive for fatigue. Negative for diaphoresis and fever.  HENT: Negative for congestion and sore throat.   Eyes: Positive for visual disturbance (blurred vision ). Negative for photophobia.  Respiratory: Negative for apnea, cough, shortness of breath and wheezing.   Cardiovascular: Negative for chest pain.  Gastrointestinal: Positive for anorexia, nausea and vomiting. Negative for abdominal pain.  Skin: Negative for rash.  Neurological: Positive for dizziness, vertigo and syncope (near syncope ). Negative for weakness, light-headedness, numbness and headaches.       Objective:   Physical Exam  Constitutional: She is oriented to person, place, and time. She appears well-developed and well-nourished. No distress.  She became extremely dizzy and nauseated when going from a supine to sitting position.   HENT:  Head: Normocephalic and atraumatic.  Right Ear: External ear normal.  Left Ear: External ear normal.  Nose: Nose normal.  Mouth/Throat: Oropharynx is clear and moist. No oropharyngeal exudate.  Eyes: Conjunctivae are normal. Pupils are equal, round, and reactive to light. Right eye exhibits no discharge. Left eye exhibits no discharge. No scleral icterus. Right eye exhibits nystagmus (horizontal ). Left eye  exhibits nystagmus (horizontal ).  Neck: Normal range of motion. Neck supple. No thyromegaly present.  Cardiovascular: Normal rate, regular rhythm, normal heart sounds and intact distal pulses.  Exam reveals no gallop and no friction rub.   No murmur heard. Pulmonary/Chest: Effort normal and breath sounds normal. No respiratory distress. She has no wheezes. She has no rales. She exhibits no tenderness.  Lymphadenopathy:    She has no cervical adenopathy.  Neurological: She is alert and oriented to person, place, and time. She has normal reflexes. She displays normal reflexes. No cranial nerve deficit. She exhibits normal muscle tone. Coordination normal.  Skin: Skin is warm and dry. No rash noted. She is not diaphoretic. No erythema. No pallor.  Psychiatric: She has a normal mood and affect. Her behavior is normal. Judgment and thought content normal.  Nursing note and vitals reviewed.     Assessment & Plan:  1. Vertigo - Exam consistent with vertigo. Will trial low dose Meclizine. Gave home directions for [facility]ply maneuver. Will get her into vestibular rehab - PT vestibular rehab; Future - meclizine (ANTIVERT) 12.5 MG tablet; Take 1 tablet (12.5 mg total) by mouth 3 (three) times daily as needed for dizziness.  Dispense: 30 tablet; Refill: 0 - ondansetron (ZOFRAN) 4 MG tablet; Take 1 tablet (4 mg total) by mouth every 8 (eight) hours as needed for nausea or vomiting.  Dispense: 20 tablet; Refill: 0 - ondansetron (ZOFRAN) injection 4 mg; Inject 2 mLs (4 mg total) into the muscle once. - Follow up if no improvement - Stay hydrated - Sleep at a 45 degree angle for the next 3 night   Shirline Freesory Anthea Udovich, NP

## 2016-01-27 NOTE — Patient Instructions (Addendum)
It was great meeting you today!  Your exam is consistent with Vertigo.   I have sent in a medication called Meclizine. Take this for dizziness. I have also sent in Zofran for nausea.   Someone from Vestibular rehab will call you to schedule your visit.   Please follow up with Dr. [name] if your symptoms do not improve  Vertigo Vertigo means you feel like you or your surroundings are moving when they are not. Vertigo can be dangerous if it occurs when you are at work, driving, or performing difficult activities.  CAUSES  Vertigo occurs when there is a conflict of signals sent to your brain from the visual and sensory systems in your body. There are many different causes of vertigo, including:  Infections, especially in the inner ear.  A bad reaction to a drug or misuse of alcohol and medicines.  Withdrawal from drugs or alcohol.  Rapidly changing positions, such as lying down or rolling over in bed.  A migraine headache.  Decreased blood flow to the brain.  Increased pressure in the brain from a head injury, infection, tumor, or bleeding. SYMPTOMS  You may feel as though the world is spinning around or you are falling to the ground. Because your balance is upset, vertigo can cause nausea and vomiting. You may have involuntary eye movements (nystagmus). DIAGNOSIS  Vertigo is usually diagnosed by physical exam. If the cause of your vertigo is unknown, your caregiver may perform imaging tests, such as an MRI scan (magnetic resonance imaging). TREATMENT  Most cases of vertigo resolve on their own, without treatment. Depending on the cause, your caregiver may prescribe certain medicines. If your vertigo is related to body position issues, your caregiver may recommend movements or procedures to correct the problem. In rare cases, if your vertigo is caused by certain inner ear problems, you may need surgery. HOME CARE INSTRUCTIONS   Follow your caregiver's instructions.  Avoid  driving.  Avoid operating heavy machinery.  Avoid performing any tasks that would be dangerous to you or others during a vertigo episode.  Tell your caregiver if you notice that certain medicines seem to be causing your vertigo. Some of the medicines used to treat vertigo episodes can actually make them worse in some people. SEEK IMMEDIATE MEDICAL CARE IF:   Your medicines do not relieve your vertigo or are making it worse.  You develop problems with talking, walking, weakness, or using your arms, hands, or legs.  You develop severe headaches.  Your nausea or vomiting continues or gets worse.  You develop visual changes.  A family member notices behavioral changes.  Your condition gets worse. MAKE SURE YOU:  Understand these instructions.  Will watch your condition.  Will get help right away if you are not doing well or get worse.   This information is not intended to replace advice given to you by your health care provider. Make sure you discuss any questions you have with your health care provider.   Document Released: 12/23/2004 Document Revised: 06/07/2011 Document Reviewed: 07/08/2014 Elsevier Interactive Patient Education [employer]2016 Elsevier Inc.

## 2016-02-24 ENCOUNTER — Ambulatory Visit (INDEPENDENT_AMBULATORY_CARE_PROVIDER_SITE_OTHER): Payer: Commercial Managed Care - HMO | Admitting: Internal Medicine

## 2016-02-24 ENCOUNTER — Encounter: Payer: Self-pay | Admitting: Internal Medicine

## 2016-02-24 VITALS — BP 140/80 | HR 95 | Temp 97.5°F | Resp 20 | Wt 166.0 lb

## 2016-02-24 DIAGNOSIS — J441 Chronic obstructive pulmonary disease with (acute) exacerbation: Secondary | ICD-10-CM

## 2016-02-24 DIAGNOSIS — Z0001 Encounter for general adult medical examination with abnormal findings: Secondary | ICD-10-CM

## 2016-02-24 DIAGNOSIS — Z23 Encounter for immunization: Secondary | ICD-10-CM | POA: Diagnosis not present

## 2016-02-24 DIAGNOSIS — I10 Essential (primary) hypertension: Secondary | ICD-10-CM

## 2016-02-24 DIAGNOSIS — R7302 Impaired glucose tolerance (oral): Secondary | ICD-10-CM

## 2016-02-24 DIAGNOSIS — R05 Cough: Secondary | ICD-10-CM

## 2016-02-24 DIAGNOSIS — R059 Cough, unspecified: Secondary | ICD-10-CM

## 2016-02-24 MED ORDER — HYDROCODONE-HOMATROPINE 5-1.5 MG/5ML PO SYRP: 5.0000 mL | ORAL_SOLUTION | Freq: Four times a day (QID) | ORAL | 0 refills | Status: DC | PRN

## 2016-02-24 MED ORDER — PREDNISONE 10 MG PO TABS: ORAL_TABLET | ORAL | 0 refills | Status: DC

## 2016-02-24 MED ORDER — METHYLPREDNISOLONE ACETATE 80 MG/ML IJ SUSP
80.0000 mg | Freq: Once | INTRAMUSCULAR | Status: AC
  Administered 2016-02-24: 80 mg via INTRAMUSCULAR

## 2016-02-24 MED ORDER — ALBUTEROL SULFATE HFA 108 (90 BASE) MCG/ACT IN AERS: 2.0000 | INHALATION_SPRAY | Freq: Four times a day (QID) | RESPIRATORY_TRACT | 2 refills | Status: DC | PRN

## 2016-02-24 MED ORDER — LEVOFLOXACIN 500 MG PO TABS: 500.0000 mg | ORAL_TABLET | Freq: Every day | ORAL | 0 refills | Status: DC

## 2016-02-24 NOTE — Progress Notes (Signed)
Subjective:    Patient ID: Leah Mata, female    DOB: 02/05/1946, 70 y.o.   MRN: [id number]  HPI  Here with acute onset mild to mod 2-3 days ST, HA, general weakness and malaise, with prod cough greenish sputum, but Pt denies chest pain, increased sob or doe, wheezing, orthopnea, PND, increased LE swelling, palpitations, dizziness or syncope, except for onset mild sob and wheezing last pm.  Pt denies new neurological symptoms such as new headache, or facial or extremity weakness or numbness   Pt denies polydipsia, polyuria Past Medical History:  Diagnosis Date  . BACK PAIN 03/14/2009  . COPD 11/01/2006  . GLUCOSE INTOLERANCE 03/14/2009  . Headache(784.0) 03/14/2009  . HYPERTENSION 11/01/2006  . 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 INSOMNIA, HX OF 01/06/2007  . Other and unspecified hyperlipidemia 08/22/2013  . Urinary frequency 03/14/2009   Past Surgical History:  Procedure Laterality Date  . CARDIAC CATHETERIZATION  2003   negative  . DILATION AND CURETTAGE OF UTERUS    . TONSILLECTOMY    . TUBAL LIGATION      reports that she has been smoking.  She has never used smokeless tobacco. She reports that she does not drink alcohol or use drugs. family history includes Congestive Heart Failure in her mother; Coronary artery disease in her other; Diabetes in her father and mother; Heart attack in her father; Hypertension in her mother. Allergies  Allergen Reactions  . Hydrochlorothiazide    Current Outpatient Prescriptions on File Prior to Visit  Medication Sig Dispense Refill  . amLODipine (NORVASC) 5 MG tablet Take 1 tablet (5 mg total) by mouth daily. 90 tablet 3  . aspirin 81 MG tablet Take 81 mg by mouth daily.      Marland Kitchen. lisinopril (PRINIVIL,ZESTRIL) 20 MG tablet Take 1 tablet (20 mg total) by mouth daily. 90 tablet 3  . meclizine (ANTIVERT) 12.5 MG tablet Take 1 tablet (12.5 mg total) by mouth 3 (three) times daily as needed for dizziness. 30 tablet 0  . ondansetron (ZOFRAN) 4  MG tablet Take 1 tablet (4 mg total) by mouth every 8 (eight) hours as needed for nausea or vomiting. 20 tablet 0  . pravastatin (PRAVACHOL) 20 MG tablet Take 1 tablet (20 mg total) by mouth daily. 90 tablet 3  . temazepam (RESTORIL) 15 MG capsule TAKE  1 CAPSULES BY MOUTH AT BEDTIME AS NEEDED 90 capsule 1  . triamcinolone cream (KENALOG) 0.1 % Apply 1 application topically 2 (two) times daily. 30 g 0   No current facility-administered medications on file prior to visit.    Review of Systems  Constitutional: Negative for unusual diaphoresis or night sweats HENT: Negative for ear swelling or discharge Eyes: Negative for worsening visual haziness  Respiratory: Negative for choking and stridor.   Gastrointestinal: Negative for distension or worsening eructation Genitourinary: Negative for retention or change in urine volume.  Musculoskeletal: Negative for other MSK pain or swelling Skin: Negative for color change and worsening wound Neurological: Negative for tremors and numbness other than noted  Psychiatric/Behavioral: Negative for decreased concentration or agitation other than above   All other system neg per pt    Objective:   Physical Exam BP 140/80   Pulse 95   Temp 97.5 F (36.4 C) (Oral)   Resp 20   Wt 166 lb (75.3 kg)   LMP 03/29/1993 (Approximate)   SpO2 99%   BMI 29.17 kg/m  VS noted, mild ill Constitutional: Pt appears in no apparent  distress HENT: Head: NCAT.  Right Ear: External ear normal.  Left Ear: External ear normal.  Eyes: . Pupils are equal, round, and reactive to light. Conjunctivae and EOM are normal Bilat tm's with mild erythema.  Max sinus areas mild tender.  Pharynx with mild erythema, no exudate Neck: Normal range of motion. Neck supple.  Cardiovascular: Normal rate and regular rhythm.   Pulmonary/Chest: Effort normal and breath sounds decreased without rales and mild scattered wheezing.  Neurological: Pt is alert. Not confused , motor grossly  intact Skin: Skin is warm. No rash, no LE edema Psychiatric: Pt behavior is normal. No agitation.  No other new exam findings    Assessment & Plan:

## 2016-02-24 NOTE — Progress Notes (Signed)
Pre visit review using our clinic review tool, if applicable. No additional management support is needed unless otherwise documented below in the visit note. 

## 2016-02-24 NOTE — Patient Instructions (Addendum)
You had the steroid shot today, and the flu shot  Please take all new medication as prescribed - the antibiotic, cough medicine if needed, prednisone, and inhaler  Please continue all other medications as before, and refills have been done if requested.  Please have the pharmacy call with any other refills you may need.  Please keep your appointments with your specialists as you may have planned  Please return in 6 months, or sooner if needed, with Lab testing done 3-5 days before

## 2016-03-01 NOTE — Assessment & Plan Note (Signed)
stable overall by history and exam, recent data reviewed with pt, and pt to continue medical treatment as before,  to f/u any worsening symptoms or concerns BP Readings from Last 3 Encounters:  02/24/16 140/80  01/27/16 118/82  08/12/15 138/78

## 2016-03-01 NOTE — Assessment & Plan Note (Signed)
stable overall by history and exam, recent data reviewed with pt, and pt to continue medical treatment as before,  to f/u any worsening symptoms or concerns Lab Results  Component Value Date   HGBA1C 6.3 08/12/2015

## 2016-03-01 NOTE — Assessment & Plan Note (Addendum)
Mild to mod, for depomedrol IM, predpac asd, albuterol MDI prn, to f/u any worsening symptoms or concerns 

## 2016-03-01 NOTE — Assessment & Plan Note (Signed)
Mild to mod, c/w bronchitis vs pna, declines cxr, for antibx course, to f/u any worsening symptoms or concerns  

## 2016-03-03 ENCOUNTER — Telehealth: Payer: Self-pay

## 2016-03-03 ENCOUNTER — Encounter: Payer: Self-pay | Admitting: Internal Medicine

## 2016-03-03 ENCOUNTER — Ambulatory Visit (INDEPENDENT_AMBULATORY_CARE_PROVIDER_SITE_OTHER): Payer: Commercial Managed Care - HMO | Admitting: Internal Medicine

## 2016-03-03 ENCOUNTER — Other Ambulatory Visit (INDEPENDENT_AMBULATORY_CARE_PROVIDER_SITE_OTHER): Payer: Commercial Managed Care - HMO

## 2016-03-03 VITALS — BP 140/80 | HR 89 | Temp 97.8°F | Resp 20 | Wt 162.0 lb

## 2016-03-03 DIAGNOSIS — R3129 Other microscopic hematuria: Secondary | ICD-10-CM

## 2016-03-03 DIAGNOSIS — E785 Hyperlipidemia, unspecified: Secondary | ICD-10-CM

## 2016-03-03 DIAGNOSIS — J441 Chronic obstructive pulmonary disease with (acute) exacerbation: Secondary | ICD-10-CM

## 2016-03-03 DIAGNOSIS — Z0001 Encounter for general adult medical examination with abnormal findings: Secondary | ICD-10-CM

## 2016-03-03 DIAGNOSIS — R7303 Prediabetes: Secondary | ICD-10-CM | POA: Diagnosis not present

## 2016-03-03 DIAGNOSIS — I10 Essential (primary) hypertension: Secondary | ICD-10-CM

## 2016-03-03 LAB — URINALYSIS, ROUTINE W REFLEX MICROSCOPIC
BILIRUBIN URINE: NEGATIVE
KETONES UR: NEGATIVE
LEUKOCYTES UA: NEGATIVE
Nitrite: NEGATIVE
PH: 7 (ref 5.0–8.0)
RBC / HPF: NONE SEEN (ref 0–?)
Specific Gravity, Urine: <=1.005 — AB (ref 1.000–1.030)
TOTAL PROTEIN, URINE-UPE24: NEGATIVE
URINE GLUCOSE: NEGATIVE
UROBILINOGEN UA: 0.2 (ref 0.0–1.0)
WBC, UA: NONE SEEN (ref 0–?)

## 2016-03-03 LAB — BASIC METABOLIC PANEL
BUN: 14 mg/dL (ref 6–23)
CALCIUM: 10.2 mg/dL (ref 8.4–10.5)
CO2: 31 meq/L (ref 19–32)
CREATININE: 1.05 mg/dL (ref 0.40–1.20)
Chloride: 99 mEq/L (ref 96–112)
GFR: 55.24 mL/min — AB (ref >60.00)
Glucose, Bld: 103 mg/dL — ABNORMAL HIGH (ref 70–99)
Potassium: 4.6 mEq/L (ref 3.5–5.1)
SODIUM: 137 meq/L (ref 135–145)

## 2016-03-03 LAB — HEMOGLOBIN A1C: HEMOGLOBIN A1C: 6.2 % (ref 4.6–6.5)

## 2016-03-03 MED ORDER — TEMAZEPAM 15 MG PO CAPS: ORAL_CAPSULE | ORAL | 1 refills | Status: DC

## 2016-03-03 MED ORDER — PREDNISONE 10 MG PO TABS: ORAL_TABLET | ORAL | 0 refills | Status: DC

## 2016-03-03 MED ORDER — LISINOPRIL 20 MG PO TABS: 20.0000 mg | ORAL_TABLET | Freq: Every day | ORAL | 3 refills | Status: DC

## 2016-03-03 MED ORDER — TRIAMCINOLONE ACETONIDE 0.1 % EX CREA: 1.0000 "application " | TOPICAL_CREAM | Freq: Two times a day (BID) | CUTANEOUS | 0 refills | Status: DC

## 2016-03-03 MED ORDER — AMLODIPINE BESYLATE 5 MG PO TABS: 5.0000 mg | ORAL_TABLET | Freq: Every day | ORAL | 3 refills | Status: DC

## 2016-03-03 MED ORDER — METHYLPREDNISOLONE ACETATE 80 MG/ML IJ SUSP
80.0000 mg | Freq: Once | INTRAMUSCULAR | Status: AC
  Administered 2016-03-03: 80 mg via INTRAMUSCULAR

## 2016-03-03 MED ORDER — PRAVASTATIN SODIUM 20 MG PO TABS: 20.0000 mg | ORAL_TABLET | Freq: Every day | ORAL | 3 refills | Status: DC

## 2016-03-03 NOTE — Patient Instructions (Signed)
You had the steroid shot today  Please take all new medication as prescribed - the prednisone  Please continue all other medications as before, and refills have been done if requested.  Please have the pharmacy call with any other refills you may need.  Please continue your efforts at being more active, low cholesterol diet, and weight control.  Please keep your appointments with your specialists as you may have planned  Please go to the LAB in the Basement (turn left off the elevator) for the tests to be done today  You will be contacted by phone if any changes need to be made immediately.  Otherwise, you will receive a letter about your results with an explanation, but please check with MyChart first.  Please remember to sign up for MyChart if you have not done so, as this will be important to you in the future with finding out test results, communicating by private email, and scheduling acute appointments online when needed.  Please return in 6 months, or sooner if needed, with Lab testing done 3-5 days before

## 2016-03-03 NOTE — Progress Notes (Signed)
Pre visit review using our clinic review tool, if applicable. No additional management support is needed unless otherwise documented below in the visit note. 

## 2016-03-03 NOTE — Telephone Encounter (Signed)
Medication refills sent to pharmacy 

## 2016-03-03 NOTE — Progress Notes (Signed)
   Subjective:    Patient ID: Leah JewelLinda D Mata, female    DOB: 10/14/1945, 70 y.o.   MRN: [id number]   HPI  Here with acute onset mild to mod 2-3 days ST, HA, general weakness and malaise, with prod cough greenish sputum, but Pt denies chest pain, increased sob or doe, wheezing, orthopnea, PND, increased LE swelling, palpitations, dizziness or syncope, except still some hoarseness and wheezing with mild sob in the past wk.    Denies urinary symptoms such as dysuria, frequency, urgency, flank pain, hematuria or n/v, fever, chills.  Pt denies polydipsia, polyuria Past Medical History:  Diagnosis Date  . BACK PAIN 03/14/2009  . COPD 11/01/2006  . GLUCOSE INTOLERANCE 03/14/2009  . Headache(784.0) 03/14/2009  . HYPERTENSION 11/01/2006  . 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 INSOMNIA, HX OF 01/06/2007  . Other and unspecified hyperlipidemia 08/22/2013  . Urinary frequency 03/14/2009   Past Surgical History:  Procedure Laterality Date  . CARDIAC CATHETERIZATION  2003   negative  . DILATION AND CURETTAGE OF UTERUS    . TONSILLECTOMY    . TUBAL LIGATION      reports that she has been smoking.  She has never used smokeless tobacco. She reports that she does not drink alcohol or use drugs. family history includes Congestive Heart Failure in her mother; Coronary artery disease in her other; Diabetes in her father and mother; Heart attack in her father; Hypertension in her mother. Allergies  Allergen Reactions  . Hydrochlorothiazide    No current outpatient prescriptions on file prior to visit.   No current facility-administered medications on file prior to visit.    Review of Systems  Constitutional: Negative for unusual diaphoresis or night sweats HENT: Negative for ear swelling or discharge Eyes: Negative for worsening visual haziness  Respiratory: Negative for choking and stridor.   Gastrointestinal: Negative for distension or worsening eructation Genitourinary: Negative for retention  or change in urine volume.  Musculoskeletal: Negative for other MSK pain or swelling Skin: Negative for color change and worsening wound Neurological: Negative for tremors and numbness other than noted  Psychiatric/Behavioral: Negative for decreased concentration or agitation other than above   All other system neg per pt    Objective:   Physical Exam BP 140/80   Pulse 89   Temp 97.8 F (36.6 C) (Oral)   Resp 20   Wt 162 lb (73.5 kg)   LMP 03/29/1993 (Approximate)   SpO2 95%   BMI 28.47 kg/m  VS noted, not ill appearing Constitutional: Pt appears in no apparent distress HENT: Head: NCAT.  Right Ear: External ear normal.  Left Ear: External ear normal.  Eyes: . Pupils are equal, round, and reactive to light. Conjunctivae and EOM are normal Neck: Normal range of motion. Neck supple.  Cardiovascular: Normal rate and regular rhythm.   Pulmonary/Chest: Effort normal and breath sounds without rales or wheezing.  Abd:  Soft, NT, ND, + BS Neurological: Pt is alert. Not confused , motor grossly intact Skin: Skin is warm. No rash, no LE edema Psychiatric: Pt behavior is normal. No agitation.  No other new exam changes    Assessment & Plan:

## 2016-03-03 NOTE — Telephone Encounter (Signed)
A user error has taken place.

## 2016-03-07 NOTE — Assessment & Plan Note (Signed)
stable overall by history and exam, recent data reviewed with pt, and pt to continue medical treatment as before,  to f/u any worsening symptoms or concerns Lab Results  Component Value Date   LDLCALC 59 08/12/2015

## 2016-03-07 NOTE — Assessment & Plan Note (Signed)
Mild to mod, for depomedrol IM 80, predpac asd, to f/u any worsening symptoms or concerns 

## 2016-03-07 NOTE — Assessment & Plan Note (Signed)
stable overall by history and exam, recent data reviewed with pt, and pt to continue medical treatment as before,  to f/u any worsening symptoms or concerns Lab Results  Component Value Date   HGBA1C 6.2 03/03/2016

## 2016-03-07 NOTE — Assessment & Plan Note (Signed)
No gross bleeding, for f/u UA today, pt declines urology for now

## 2016-03-07 NOTE — Assessment & Plan Note (Signed)
stable overall by history and exam, recent data reviewed with pt, and pt to continue medical treatment as before,  to f/u any worsening symptoms or concerns BP Readings from Last 3 Encounters:  03/03/16 140/80  02/24/16 140/80  01/27/16 118/82

## 2016-03-08 NOTE — Progress Notes (Addendum)
Subjective:   Leah Mata is a 70 y.o. female who presents for an Initial Medicare Annual Wellness Visit.  The Patient was informed that the wellness visit is to identify future health risk and educate and initiate measures that can reduce risk for increased disease through the lifespan.   Describes health as fair, good or great? "great" Retired, often cleans houses.   Review of Systems    No ROS.  Medicare Wellness Visit.  Cardiac Risk Factors include: advanced age (>3155men, 38>65 women);dyslipidemia;smoking/ tobacco exposure;hypertension   Sleep patterns: Sleeps 7-8 hours, occasionally uses sleep aid.  Home Safety/Smoke Alarms:  Smoked Chief Executive Officerdetectors and security in place.  Living environment; residence and Firearm Safety: Lives with son and grandson in 1 story home. Feels safe. Firearms locked away.  Seat Belt Safety/Bike Helmet: Wears seat belt.    Counseling:   Eye Exam-Last exam 2015, every 2 years "eye doctor". Will make appt. Dental-Last exam > 5 years. Full dentures.   Female:   Pap-01/31/2014, followed by GYN       Mammo-10/22/2015, negative.        Dexa scan-11/26/10, osteopenia. Order placed.      [facility]-Colonoscopy 07/07/12, normal. Recall 10 years.         Objective:    Today's Vitals   03/09/16 0803  BP: 132/78  Pulse: 86  SpO2: 98%  Weight: 164 lb 0.6 oz (74.4 kg)  Height: 5\' 3"  (1.6 m)   Body mass index is 29.06 kg/m.   Current Medications (verified) Outpatient Encounter Prescriptions as of 03/09/2016  Medication Sig  . amLODipine (NORVASC) 5 MG tablet Take 1 tablet (5 mg total) by mouth daily.  Marland Kitchen. lisinopril (PRINIVIL,ZESTRIL) 20 MG tablet Take 1 tablet (20 mg total) by mouth daily.  . pravastatin (PRAVACHOL) 20 MG tablet Take 1 tablet (20 mg total) by mouth daily.  . predniSONE (DELTASONE) 10 MG tablet 3 tabs by mouth per day for 3 days,2tabs per day for 3 days,1tab per day for 3 days  . temazepam (RESTORIL) 15 MG capsule TAKE  1 CAPSULES BY MOUTH AT  BEDTIME AS NEEDED  . VENTOLIN HFA 108 (90 Base) MCG/ACT inhaler INHALE 2 PUFFS INTO THE LUNGS EVERY 6 (SIX) HOURS AS NEEDED FOR WHEEZING OR SHORTNESS OF BREATH.  Marland Kitchen. HYDROcodone-homatropine (HYCODAN) 5-1.5 MG/5ML syrup TAKE 5 MLS BY MOUTH EVERY 6 HOURS AS NEEDED FOR COUGH  . triamcinolone cream (KENALOG) 0.1 % Apply 1 application topically 2 (two) times daily. (Patient not taking: Reported on 03/09/2016)   No facility-administered encounter medications on file as of 03/09/2016.     Allergies (verified) Hydrochlorothiazide   History: Past Medical History:  Diagnosis Date  . BACK PAIN 03/14/2009  . COPD 11/01/2006  . GLUCOSE INTOLERANCE 03/14/2009  . Headache(784.0) 03/14/2009  . HYPERTENSION 11/01/2006  . 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 INSOMNIA, HX OF 01/06/2007  . Other and unspecified hyperlipidemia 08/22/2013  . Urinary frequency 03/14/2009   Past Surgical History:  Procedure Laterality Date  . CARDIAC CATHETERIZATION  2003   negative  . DILATION AND CURETTAGE OF UTERUS    . TONSILLECTOMY    . TUBAL LIGATION     Family History  Problem Relation Age of Onset  . Coronary artery disease Other     female 1st degree relative <60 and female 1st degree relative <50  . Diabetes Father   . Heart attack Father   . Diabetes Mother   . Hypertension Mother   . Congestive Heart Failure Mother   .  Colon cancer Neg Hx    Social History   Occupational History  . Not on file.   Social History Main Topics  . Smoking status: Current Every Day Smoker  . Smokeless tobacco: Never Used     Comment: 2 packs per week  . Alcohol use No  . Drug use: No  . Sexual activity: No     Comment: BTL    Tobacco Counseling Ready to quit: Yes Counseling given: Yes   Activities of Daily Living In your present state of health, do you have any difficulty performing the following activities: 03/09/2016  Hearing? N  Vision? N  Difficulty concentrating or making decisions? N  Walking or  climbing stairs? N  Dressing or bathing? N  Doing errands, shopping? N  Preparing Food and eating ? N  Using the Toilet? N  In the past six months, have you accidently leaked urine? Y  Do you have problems with loss of bowel control? N  Managing your Medications? N  Managing your Finances? N  Housekeeping or managing your Housekeeping? N  Some recent data might be hidden    Immunizations and Health Maintenance Immunization History  Administered Date(s) Administered  . Influenza,inj,Quad PF,36+ Mos 12/01/2012, 01/31/2014, 02/24/2016  . Influenza-Unspecified 11/28/2011, 01/11/2015  . Pneumococcal Conjugate-13 08/15/2014  . Pneumococcal Polysaccharide-23 05/30/2012  . Td 11/28/2006  . Zoster 11/29/2008   Health Maintenance Due  Topic Date Due  . Hepatitis C Screening  [birth year]    Patient Care Team: Corwin Levins, MD as PCP - General Jerene Bears, MD as Consulting Physician (Gynecology)  Indicate any recent Medical Services you may have received from other than [facility] providers in the past year (date may be approximate).     Assessment:   This is a routine wellness examination for Leah Mata. Physical assessment deferred to PCP.   Hearing/Vision screen Hearing Screening Comments: Able to hear conversational tones w/o difficulty. No issues reported.   Vision Screening Comments: Wears reading glasses.   Dietary issues and exercise activities discussed: Current Exercise Habits: Home exercise routine, Type of exercise: walking;strength training/weights, Time (Minutes): 40, Frequency (Times/Week): 7, Weekly Exercise (Minutes/Week): 280, Intensity: Mild, Exercise limited by: None identified   Diet (meal preparation, eat out, water intake, caffeinated beverages, dairy products, fruits and vegetables): Eats at home mostly. Drinks Diet Mt Dew, water and sweet tea throughout the day.   Breakfast: oatmeal, eggs, gravy biscuits, diet mt dew Lunch: sandwich Dinner: meat (grilled), 2  vegetables  Encouraged to increase water intake. Discussed heart healthy, low cholesterol diet.  Discussed increasing activity, starting classes back at gym.   Goals    . Weight (lb) < 154 lb (69.9 kg)          Would like to lose 10 pounds by increasing activity.       Depression Screen PHQ 2/9 Scores 03/09/2016 08/12/2015 08/15/2014 07/31/2013  PHQ - 2 Score 0 0 0 0    Fall Risk Fall Risk  03/09/2016 08/12/2015 08/15/2014 07/31/2013  Falls in the past year? No No No No    Cognitive Function:       Ad8 score reviewed for issues:  Issues making decisions: no  Less interest in hobbies / activities:no  Repeats questions, stories (family complaining):no  Trouble using ordinary gadgets (microwave, computer, phone):no  Forgets the month or year: no  Mismanaging finances: no  Remembering appts:no  Daily problems with thinking and/or memory:no Ad8 score is=0     Screening Tests Health Maintenance  Topic Date Due  . Hepatitis C Screening  28-Feb-1946  . TETANUS/TDAP  11/27/2016  . MAMMOGRAM  10/21/2017  . COLONOSCOPY  07/08/2022  . INFLUENZA VACCINE  Completed  . DEXA SCAN  Completed  . ZOSTAVAX  Completed  . PNA vac Low Risk Adult  Completed      Plan:     Eat heart healthy diet (full of fruits, vegetables, whole grains, lean protein, water--limit salt, fat, and sugar intake) and increase physical activity as tolerated.  Continue doing brain stimulating activities (puzzles, reading, adult coloring books, staying active) to keep memory sharp.   Bring a copy of your completed advance directives to your next office visit.  Schedule bone scan. [phone]  During the course of the visit, Leah Mata was educated and counseled about the following appropriate screening and preventive services:   Vaccines to include Pneumoccal, Influenza, Hepatitis B, Td, Zostavax, HCV  Cardiovascular disease screening  Colorectal cancer screening  Bone density screening  Diabetes  screening  Glaucoma screening  Mammography/PAP  Nutrition counseling  Smoking cessation counseling  Patient Instructions (the written plan) were given to the patient.    Alysia PennaKimberly R Laira Penninger, RN   03/09/2016    Medical screening examination/treatment/procedure(s) were performed by non-physician practitioner and as supervising physician I was immediately available for consultation/collaboration. I agree with above. Oliver BarreJames John, MD

## 2016-03-08 NOTE — Progress Notes (Signed)
Pre visit review using our clinic review tool, if applicable. No additional management support is needed unless otherwise documented below in the visit note. 

## 2016-03-09 ENCOUNTER — Ambulatory Visit (INDEPENDENT_AMBULATORY_CARE_PROVIDER_SITE_OTHER): Payer: Commercial Managed Care - HMO

## 2016-03-09 ENCOUNTER — Other Ambulatory Visit: Payer: Self-pay | Admitting: Internal Medicine

## 2016-03-09 VITALS — BP 132/78 | HR 86 | Ht 63.0 in | Wt 164.0 lb

## 2016-03-09 DIAGNOSIS — Z0000 Encounter for general adult medical examination without abnormal findings: Secondary | ICD-10-CM | POA: Diagnosis not present

## 2016-03-09 DIAGNOSIS — Z780 Asymptomatic menopausal state: Secondary | ICD-10-CM | POA: Diagnosis not present

## 2016-03-09 MED ORDER — VARENICLINE TARTRATE 0.5 MG X 11 & 1 MG X 42 PO MISC: ORAL | 0 refills | Status: DC

## 2016-03-09 MED ORDER — VARENICLINE TARTRATE 1 MG PO TABS: 1.0000 mg | ORAL_TABLET | Freq: Two times a day (BID) | ORAL | 1 refills | Status: DC

## 2016-03-09 NOTE — Telephone Encounter (Signed)
Done hardcopy to Corinne  

## 2016-03-09 NOTE — Telephone Encounter (Signed)
faxed

## 2016-03-09 NOTE — Patient Instructions (Addendum)
Eat heart healthy diet (full of fruits, vegetables, whole grains, lean protein, water--limit salt, fat, and sugar intake) and increase physical activity as tolerated.  Continue doing brain stimulating activities (puzzles, reading, adult coloring books, staying active) to keep memory sharp.   Bring a copy of your completed advance directives to your next office visit.  Schedule bone scan. [phone]   Fall Prevention in the Home Introduction Falls can cause injuries. They can happen to people of all ages. There are many things you can do to make your home safe and to help prevent falls. What can I do on the outside of my home?  Regularly fix the edges of walkways and driveways and fix any cracks.  Remove anything that might make you trip as you walk through a door, such as a raised step or threshold.  Trim any bushes or trees on the path to your home.  Use bright outdoor lighting.  Clear any walking paths of anything that might make someone trip, such as rocks or tools.  Regularly check to see if handrails are loose or broken. Make sure that both sides of any steps have handrails.  Any raised decks and porches should have guardrails on the edges.  Have any leaves, snow, or ice cleared regularly.  Use sand or salt on walking paths during winter.  Clean up any spills in your garage right away. This includes oil or grease spills. What can I do in the bathroom?  Use night lights.  Install grab bars by the toilet and in the tub and shower. Do not use towel bars as grab bars.  Use non-skid mats or decals in the tub or shower.  If you need to sit down in the shower, use a plastic, non-slip stool.  Keep the floor dry. Clean up any water that spills on the floor as soon as it happens.  Remove soap buildup in the tub or shower regularly.  Attach bath mats securely with double-sided non-slip rug tape.  Do not have throw rugs and other things on the floor that can make you  trip. What can I do in the bedroom?  Use night lights.  Make sure that you have a light by your bed that is easy to reach.  Do not use any sheets or blankets that are too big for your bed. They should not hang down onto the floor.  Have a firm chair that has side arms. You can use this for support while you get dressed.  Do not have throw rugs and other things on the floor that can make you trip. What can I do in the kitchen?  Clean up any spills right away.  Avoid walking on wet floors.  Keep items that you use a lot in easy-to-reach places.  If you need to reach something above you, use a strong step stool that has a grab bar.  Keep electrical cords out of the way.  Do not use floor polish or wax that makes floors slippery. If you must use wax, use non-skid floor wax.  Do not have throw rugs and other things on the floor that can make you trip. What can I do with my stairs?  Do not leave any items on the stairs.  Make sure that there are handrails on both sides of the stairs and use them. Fix handrails that are broken or loose. Make sure that handrails are as long as the stairways.  Check any carpeting to make sure that it is  firmly attached to the stairs. Fix any carpet that is loose or worn.  Avoid having throw rugs at the top or bottom of the stairs. If you do have throw rugs, attach them to the floor with carpet tape.  Make sure that you have a light switch at the top of the stairs and the bottom of the stairs. If you do not have them, ask someone to add them for you. What else can I do to help prevent falls?  Wear shoes that:  Do not have high heels.  Have rubber bottoms.  Are comfortable and fit you well.  Are closed at the toe. Do not wear sandals.  If you use a stepladder:  Make sure that it is fully opened. Do not climb a closed stepladder.  Make sure that both sides of the stepladder are locked into place.  Ask someone to hold it for you, if  possible.  Clearly mark and make sure that you can see:  Any grab bars or handrails.  First and last steps.  Where the edge of each step is.  Use tools that help you move around (mobility aids) if they are needed. These include:  Canes.  Walkers.  Scooters.  Crutches.  Turn on the lights when you go into a dark area. Replace any light bulbs as soon as they burn out.  Set up your furniture so you have a clear path. Avoid moving your furniture around.  If any of your floors are uneven, fix them.  If there are any pets around you, be aware of where they are.  Review your medicines with your doctor. Some medicines can make you feel dizzy. This can increase your chance of falling. Ask your doctor what other things that you can do to help prevent falls. This information is not intended to replace advice given to you by your health care provider. Make sure you discuss any questions you have with your health care provider. Document Released: 01/09/2009 Document Revised: 08/21/2015 Document Reviewed: 04/19/2014  2017 [employer]vier  Health Maintenance, Female Introduction Adopting a healthy lifestyle and getting preventive care can go a long way to promote health and wellness. Talk with your health care provider about what schedule of regular examinations is right for you. This is a good chance for you to check in with your provider about disease prevention and staying healthy. In between checkups, there are plenty of things you can do on your own. Experts have done a lot of research about which lifestyle changes and preventive measures are most likely to keep you healthy. Ask your health care provider for more information. Weight and diet Eat a healthy diet  Be sure to include plenty of vegetables, fruits, low-fat dairy products, and lean protein.  Do not eat a lot of foods high in solid fats, added sugars, or salt.  Get regular exercise. This is one of the most important things you  can do for your health.  Most adults should exercise for at least 150 minutes each week. The exercise should increase your heart rate and make you sweat (moderate-intensity exercise).  Most adults should also do strengthening exercises at least twice a week. This is in addition to the moderate-intensity exercise. Maintain a healthy weight  Body mass index (BMI) is a measurement that can be used to identify possible weight problems. It estimates body fat based on height and weight. Your health care provider can help determine your BMI and help you achieve or maintain a healthy  weight.  For females 65 years of age and older:  A BMI below 18.5 is considered underweight.  A BMI of 18.5 to 24.9 is normal.  A BMI of 25 to 29.9 is considered overweight.  A BMI of 30 and above is considered obese. Watch levels of cholesterol and blood lipids  You should start having your blood tested for lipids and cholesterol at 68 years of age, then have this test every 5 years.  You may need to have your cholesterol levels checked more often if:  Your lipid or cholesterol levels are high.  You are older than 68 years of age.  You are at high risk for heart disease. Cancer screening Lung Cancer  Lung cancer screening is recommended for adults 75-39 years old who are at high risk for lung cancer because of a history of smoking.  A yearly low-dose CT scan of the lungs is recommended for people who:  Currently smoke.  Have quit within the past 15 years.  Have at least a 30-pack-year history of smoking. A pack year is smoking an average of one pack of cigarettes a day for 1 year.  Yearly screening should continue until it has been 15 years since you quit.  Yearly screening should stop if you develop a health problem that would prevent you from having lung cancer treatment. Breast Cancer  Practice breast self-awareness. This means understanding how your breasts normally appear and feel.  It also  means doing regular breast self-exams. Let your health care provider know about any changes, no matter how small.  If you are in your 20s or 30s, you should have a clinical breast exam (CBE) by a health care provider every 1-3 years as part of a regular health exam.  If you are 11 or older, have a CBE every year. Also consider having a breast X-ray (mammogram) every year.  If you have a family history of breast cancer, talk to your health care provider about genetic screening.  If you are at high risk for breast cancer, talk to your health care provider about having an MRI and a mammogram every year.  Breast cancer gene (BRCA) assessment is recommended for women who have family members with BRCA-related cancers. BRCA-related cancers include:  Breast.  Ovarian.  Tubal.  Peritoneal cancers.  Results of the assessment will determine the need for genetic counseling and BRCA1 and BRCA2 testing. Cervical Cancer  Your health care provider may recommend that you be screened regularly for cancer of the pelvic organs (ovaries, uterus, and vagina). This screening involves a pelvic examination, including checking for microscopic changes to the surface of your cervix (Pap test). You may be encouraged to have this screening done every 3 years, beginning at age 31.  For women ages 75-65, health care providers may recommend pelvic exams and Pap testing every 3 years, or they may recommend the Pap and pelvic exam, combined with testing for human papilloma virus (HPV), every 5 years. Some types of HPV increase your risk of cervical cancer. Testing for HPV may also be done on women of any age with unclear Pap test results.  Other health care providers may not recommend any screening for nonpregnant women who are considered low risk for pelvic cancer and who do not have symptoms. Ask your health care provider if a screening pelvic exam is right for you.  If you have had past treatment for cervical cancer or  a condition that could lead to cancer, you need Pap tests  and screening for cancer for at least 20 years after your treatment. If Pap tests have been discontinued, your risk factors (such as having a new sexual partner) need to be reassessed to determine if screening should resume. Some women have medical problems that increase the chance of getting cervical cancer. In these cases, your health care provider may recommend more frequent screening and Pap tests. Colorectal Cancer  This type of cancer can be detected and often prevented.  Routine colorectal cancer screening usually begins at 68 years of age and continues through 68 years of age.  Your health care provider may recommend screening at an earlier age if you have risk factors for colon cancer.  Your health care provider may also recommend using home test kits to check for hidden blood in the stool.  A small camera at the end of a tube can be used to examine your colon directly (sigmoidoscopy or colonoscopy). This is done to check for the earliest forms of colorectal cancer.  Routine screening usually begins at age 35.  Direct examination of the colon should be repeated every 5-10 years through 68 years of age. However, you may need to be screened more often if early forms of precancerous polyps or small growths are found. Skin Cancer  Check your skin from head to toe regularly.  Tell your health care provider about any new moles or changes in moles, especially if there is a change in a mole's shape or color.  Also tell your health care provider if you have a mole that is larger than the size of a pencil eraser.  Always use sunscreen. Apply sunscreen liberally and repeatedly throughout the day.  Protect yourself by wearing long sleeves, pants, a wide-brimmed hat, and sunglasses whenever you are outside. Heart disease, diabetes, and high blood pressure  High blood pressure causes heart disease and increases the risk of stroke. High  blood pressure is more likely to develop in:  People who have blood pressure in the high end of the normal range (130-139/85-89 mm Hg).  People who are overweight or obese.  People who are African American.  If you are 53-45 years of age, have your blood pressure checked every 3-5 years. If you are 59 years of age or older, have your blood pressure checked every year. You should have your blood pressure measured twice-once when you are at a hospital or clinic, and once when you are not at a hospital or clinic. Record the average of the two measurements. To check your blood pressure when you are not at a hospital or clinic, you can use:  An automated blood pressure machine at a pharmacy.  A home blood pressure monitor.  If you are between 12 years and 61 years old, ask your health care provider if you should take aspirin to prevent strokes.  Have regular diabetes screenings. This involves taking a blood sample to check your fasting blood sugar level.  If you are at a normal weight and have a low risk for diabetes, have this test once every three years after 68 years of age.  If you are overweight and have a high risk for diabetes, consider being tested at a younger age or more often. Preventing infection Hepatitis B  If you have a higher risk for hepatitis B, you should be screened for this virus. You are considered at high risk for hepatitis B if:  You were born in a country where hepatitis B is common. Ask your health care  provider which countries are considered high risk.  Your parents were born in a high-risk country, and you have not been immunized against hepatitis B (hepatitis B vaccine).  You have HIV or AIDS.  You use needles to inject street drugs.  You live with someone who has hepatitis B.  You have had sex with someone who has hepatitis B.  You get hemodialysis treatment.  You take certain medicines for conditions, including cancer, organ transplantation, and  autoimmune conditions. Hepatitis C  Blood testing is recommended for:  Everyone born from [birth year] through 1963.  Anyone with known risk factors for hepatitis C. Sexually transmitted infections (STIs)  You should be screened for sexually transmitted infections (STIs) including gonorrhea and chlamydia if:  You are sexually active and are younger than 68 years of age.  You are older than 68 years of age and your health care provider tells you that you are at risk for this type of infection.  Your sexual activity has changed since you were last screened and you are at an increased risk for chlamydia or gonorrhea. Ask your health care provider if you are at risk.  If you do not have HIV, but are at risk, it may be recommended that you take a prescription medicine daily to prevent HIV infection. This is called pre-exposure prophylaxis (PrEP). You are considered at risk if:  You are sexually active and do not regularly use condoms or know the HIV status of your partner(s).  You take drugs by injection.  You are sexually active with a partner who has HIV. Talk with your health care provider about whether you are at high risk of being infected with HIV. If you choose to begin PrEP, you should first be tested for HIV. You should then be tested every 3 months for as long as you are taking PrEP. Pregnancy  If you are premenopausal and you may become pregnant, ask your health care provider about preconception counseling.  If you may become pregnant, take 400 to 800 micrograms (mcg) of folic acid every day.  If you want to prevent pregnancy, talk to your health care provider about birth control (contraception). Osteoporosis and menopause  Osteoporosis is a disease in which the bones lose minerals and strength with aging. This can result in serious bone fractures. Your risk for osteoporosis can be identified using a bone density scan.  If you are 56 years of age or older, or if you are at risk for  osteoporosis and fractures, ask your health care provider if you should be screened.  Ask your health care provider whether you should take a calcium or vitamin D supplement to lower your risk for osteoporosis.  Menopause may have certain physical symptoms and risks.  Hormone replacement therapy may reduce some of these symptoms and risks. Talk to your health care provider about whether hormone replacement therapy is right for you. Follow these instructions at home:  Schedule regular health, dental, and eye exams.  Stay current with your immunizations.  Do not use any tobacco products including cigarettes, chewing tobacco, or electronic cigarettes.  If you are pregnant, do not drink alcohol.  If you are breastfeeding, limit how much and how often you drink alcohol.  Limit alcohol intake to no more than 1 drink per day for nonpregnant women. One drink equals 12 ounces of beer, 5 ounces of wine, or 1 ounces of hard liquor.  Do not use street drugs.  Do not share needles.  Ask your health care  provider for help if you need support or information about quitting drugs.  Tell your health care provider if you often feel depressed.  Tell your health care provider if you have ever been abused or do not feel safe at home. This information is not intended to replace advice given to you by your health care provider. Make sure you discuss any questions you have with your health care provider. Document Released: 09/28/2010 Document Revised: 08/21/2015 Document Reviewed: 12/17/2014  2017 Elsevier   Steps to Quit Smoking Smoking tobacco can be harmful to your health and can affect almost every organ in your body. Smoking puts you, and those around you, at risk for developing many serious chronic diseases. Quitting smoking is difficult, but it is one of the best things that you can do for your health. It is never too late to quit. What are the benefits of quitting smoking? When you quit  smoking, you lower your risk of developing serious diseases and conditions, such as:  Lung cancer or lung disease, such as COPD.  Heart disease.  Stroke.  Heart attack.  Infertility.  Osteoporosis and bone fractures. Additionally, symptoms such as coughing, wheezing, and shortness of breath may get better when you quit. You may also find that you get sick less often because your body is stronger at fighting off colds and infections. If you are pregnant, quitting smoking can help to reduce your chances of having a baby of low birth weight. How do I get ready to quit? When you decide to quit smoking, create a plan to make sure that you are successful. Before you quit:  Pick a date to quit. Set a date within the next two weeks to give you time to prepare.  Write down the reasons why you are quitting. Keep this list in places where you will see it often, such as on your bathroom mirror or in your car or wallet.  Identify the people, places, things, and activities that make you want to smoke (triggers) and avoid them. Make sure to take these actions:  Throw away all cigarettes at home, at work, and in your car.  Throw away smoking accessories, such as Scientist, research (medical).  Clean your car and make sure to empty the ashtray.  Clean your home, including curtains and carpets.  Tell your family, friends, and coworkers that you are quitting. Support from your loved ones can make quitting easier.  Talk with your health care provider about your options for quitting smoking.  Find out what treatment options are covered by your health insurance. What strategies can I use to quit smoking? Talk with your healthcare provider about different strategies to quit smoking. Some strategies include:  Quitting smoking altogether instead of gradually lessening how much you smoke over a period of time. Research shows that quitting "cold [place]" is more successful than gradually quitting.  Attending  in-person counseling to help you build problem-solving skills. You are more likely to have success in quitting if you attend several counseling sessions. Even short sessions of 10 minutes can be effective.  Finding resources and support systems that can help you to quit smoking and remain smoke-free after you quit. These resources are most helpful when you use them often. They can include:  Online chats with a Social worker.  Telephone quitlines.  Printed Furniture conservator/restorer.  Support groups or group counseling.  Text messaging programs.  Mobile phone applications.  Taking medicines to help you quit smoking. (If you are pregnant or breastfeeding,  talk with your health care provider first.) Some medicines contain nicotine and some do not. Both types of medicines help with cravings, but the medicines that include nicotine help to relieve withdrawal symptoms. Your health care provider may recommend:  Nicotine patches, gum, or lozenges.  Nicotine inhalers or sprays.  Non-nicotine medicine that is taken by mouth. Talk with your health care provider about combining strategies, such as taking medicines while you are also receiving in-person counseling. Using these two strategies together makes you more likely to succeed in quitting than if you used either strategy on its own. If you are pregnant or breastfeeding, talk with your health care provider about finding counseling or other support strategies to quit smoking. Do not take medicine to help you quit smoking unless told to do so by your health care provider. What things can I do to make it easier to quit? Quitting smoking might feel overwhelming at first, but there is a lot that you can do to make it easier. Take these important actions:  Reach out to your family and friends and ask that they support and encourage you during this time. Call telephone quitlines, reach out to support groups, or work with a counselor for support.  Ask people who  smoke to avoid smoking around you.  Avoid places that trigger you to smoke, such as bars, parties, or smoke-break areas at work.  Spend time around people who do not smoke.  Lessen stress in your life, because stress can be a smoking trigger for some people. To lessen stress, try:  Exercising regularly.  Deep-breathing exercises.  Yoga.  Meditating.  Performing a body scan. This involves closing your eyes, scanning your body from head to toe, and noticing which parts of your body are particularly tense. Purposefully relax the muscles in those areas.  Download or purchase mobile phone or tablet apps (applications) that can help you stick to your quit plan by providing reminders, tips, and encouragement. There are many free apps, such as QuitGuide from the [employer] Office manager for Disease Control and Prevention). You can find other support for quitting smoking (smoking cessation) through smokefree.gov and other websites. How will I feel when I quit smoking? Within the first 24 hours of quitting smoking, you may start to feel some withdrawal symptoms. These symptoms are usually most noticeable 2-3 days after quitting, but they usually do not last beyond 2-3 weeks. Changes or symptoms that you might experience include:  Mood swings.  Restlessness, anxiety, or irritation.  Difficulty concentrating.  Dizziness.  Strong cravings for sugary foods in addition to nicotine.  Mild weight gain.  Constipation.  Nausea.  Coughing or a sore throat.  Changes in how your medicines work in your body.  A depressed mood.  Difficulty sleeping (insomnia). After the first 2-3 weeks of quitting, you may start to notice more positive results, such as:  Improved sense of smell and taste.  Decreased coughing and sore throat.  Slower heart rate.  Lower blood pressure.  Clearer skin.  The ability to breathe more easily.  Fewer sick days. Quitting smoking is very challenging for most people. Do  not get discouraged if you are not successful the first time. Some people need to make many attempts to quit before they achieve long-term success. Do your best to stick to your quit plan, and talk with your health care provider if you have any questions or concerns. This information is not intended to replace advice given to you by your health care provider.  Make sure you discuss any questions you have with your health care provider. Document Released: 03/09/2001 Document Revised: 11/11/2015 Document Reviewed: 07/30/2014 Elsevier Interactive Patient Education  2017 [employer].

## 2016-03-11 NOTE — Progress Notes (Signed)
70 y.o. [id number] Widowed Caucasian F here for annual exam.  Doing well.  She had pneumonia in late Nov/early Dec.  Denies vaginal bleeding.    Pt reports she met with wellness coach from [place].  She states she was told they would schedule this for her.  Reports increase odor and color change to urine the last few days.  Wondering if this could be an infection.  Would like testing done.  Patient's last menstrual period was 03/29/1993 (approximate).          Sexually active: No.  The current method of family planning is tubal ligation.    Exercising: Yes.    Walking Smoker:  yes  Health Maintenance: Pap:  01/31/14 negative  History of abnormal Pap:  no MMG:  10/22/15 BIRADS 1 negative  Colonoscopy:  07/07/12 normal- repeat 10 years  BMD:   11/27/10 osteopenia  TDaP:  11/28/06  Pneumonia vaccine(s):  05/30/12, 08/15/14  Zostavax:   11/29/08  Hep C testing: Has been ordered by Dr. Gala Murdoch not completed.  D/w pt today.  Declines having it done today. Screening Labs: PCP, Hb today: PCP, Urine today: PCP   reports that she has been smoking.  She has never used smokeless tobacco. She reports that she does not drink alcohol or use drugs.  Past Medical History:  Diagnosis Date  . BACK PAIN 03/14/2009  . COPD 11/01/2006  . GLUCOSE INTOLERANCE 03/14/2009  . Headache(784.0) 03/14/2009  . HYPERTENSION 11/01/2006  . 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 INSOMNIA, HX OF 01/06/2007  . Other and unspecified hyperlipidemia 08/22/2013  . Urinary frequency 03/14/2009    Past Surgical History:  Procedure Laterality Date  . CARDIAC CATHETERIZATION  2003   negative  . DILATION AND CURETTAGE OF UTERUS    . TONSILLECTOMY    . TUBAL LIGATION     Meds:  Updated and reviewed in EPIC.  Family History  Problem Relation Age of Onset  . Coronary artery disease Other     female 1st degree relative <60 and female 1st degree relative <50  . Diabetes Father   . Heart attack Father   . Diabetes  Mother   . Hypertension Mother   . Congestive Heart Failure Mother   . Colon cancer Neg Hx     ROS:  Pertinent items are noted in HPI.  Otherwise, a comprehensive ROS was negative.  Exam:   Vitals:   03/12/16 0846  BP: 100/60  Pulse: 96  Resp: (!) 24   General appearance: alert, cooperative and appears stated age Head: Normocephalic, without obvious abnormality, atraumatic Neck: no adenopathy, supple, symmetrical, trachea midline and thyroid normal to inspection and palpation Lungs: clear to auscultation bilaterally Breasts: normal appearance, no masses or tenderness Heart: regular rate and rhythm Abdomen: soft, non-tender; bowel sounds normal; no masses,  no organomegaly Extremities: extremities normal, atraumatic, no cyanosis or edema Skin: Skin color, texture, turgor normal. No rashes or lesions Lymph nodes: Cervical, supraclavicular, and axillary nodes normal. No abnormal inguinal nodes palpated Neurologic: Grossly normal   Pelvic: External genitalia:  no lesions              Urethra:  normal appearing urethra with no masses, tenderness or lesions              Bartholins and Skenes: normal                 Vagina: normal appearing vagina with normal color and discharge, no lesions  Cervix: no lesions              Pap taken: Yes.   Bimanual Exam:  Uterus:  normal size, contour, position, consistency, mobility, non-tender              Adnexa: normal adnexa and no mass, fullness, tenderness               Rectovaginal: Confirms               Anus:  normal sphincter tone, no lesions  Chaperone was present for exam.  A:   Well Woman with normal exam PMP, No HRT Recurrent vulvar and breast furuncles/sebacous cysts Smoker Chronic lung disease with recent severe bronchitis/pneumonia Osteopenia Constipation  P: Mammogram yearly pap smear with neg HR HPV 2013. Pap obtained today. Labs/vaccines with PCP  Urine micro and culture  Return annually or  prn

## 2016-03-12 ENCOUNTER — Other Ambulatory Visit: Payer: Self-pay | Admitting: Internal Medicine

## 2016-03-12 ENCOUNTER — Encounter: Payer: Self-pay | Admitting: Obstetrics & Gynecology

## 2016-03-12 ENCOUNTER — Ambulatory Visit (INDEPENDENT_AMBULATORY_CARE_PROVIDER_SITE_OTHER): Payer: Medicare PPO | Admitting: Obstetrics & Gynecology

## 2016-03-12 VITALS — BP 100/60 | HR 96 | Resp 24 | Ht 63.25 in | Wt 162.4 lb

## 2016-03-12 DIAGNOSIS — R3989 Other symptoms and signs involving the genitourinary system: Secondary | ICD-10-CM

## 2016-03-12 DIAGNOSIS — Z01419 Encounter for gynecological examination (general) (routine) without abnormal findings: Secondary | ICD-10-CM

## 2016-03-12 DIAGNOSIS — Z124 Encounter for screening for malignant neoplasm of cervix: Secondary | ICD-10-CM | POA: Diagnosis not present

## 2016-03-12 DIAGNOSIS — R8290 Unspecified abnormal findings in urine: Secondary | ICD-10-CM | POA: Diagnosis not present

## 2016-03-12 NOTE — Patient Instructions (Signed)
Colace 100mg  (docusate sodium) up to four times daily  Miralax--one scoop once daily.  This may make you too loose so use it cautiously.

## 2016-03-13 LAB — URINALYSIS, MICROSCOPIC ONLY
Bacteria, UA: NONE SEEN [HPF]
CASTS: NONE SEEN [LPF]
Crystals: NONE SEEN [HPF]
RBC / HPF: NONE SEEN RBC/HPF (ref <=2)
SQUAMOUS EPITHELIAL / LPF: NONE SEEN [HPF] (ref <=5)
WBC, UA: NONE SEEN WBC/HPF (ref <=5)
YEAST: NONE SEEN [HPF]

## 2016-03-14 LAB — URINE CULTURE: Organism ID, Bacteria: NO GROWTH

## 2016-03-15 ENCOUNTER — Telehealth: Payer: Self-pay | Admitting: Internal Medicine

## 2016-03-15 DIAGNOSIS — E2839 Other primary ovarian failure: Secondary | ICD-10-CM

## 2016-03-15 LAB — PAP IG (IMAGE GUIDED)

## 2016-03-15 NOTE — Telephone Encounter (Signed)
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

## 2016-03-16 NOTE — Telephone Encounter (Signed)
Test order has been changed

## 2016-06-11 ENCOUNTER — Ambulatory Visit: Payer: Commercial Managed Care - HMO | Admitting: Obstetrics & Gynecology

## 2016-07-14 ENCOUNTER — Other Ambulatory Visit: Payer: Self-pay | Admitting: Internal Medicine

## 2016-09-07 ENCOUNTER — Other Ambulatory Visit (INDEPENDENT_AMBULATORY_CARE_PROVIDER_SITE_OTHER): Payer: Medicare PPO

## 2016-09-07 ENCOUNTER — Encounter: Payer: Self-pay | Admitting: Internal Medicine

## 2016-09-07 ENCOUNTER — Ambulatory Visit (INDEPENDENT_AMBULATORY_CARE_PROVIDER_SITE_OTHER): Payer: Medicare PPO | Admitting: Internal Medicine

## 2016-09-07 ENCOUNTER — Ambulatory Visit (INDEPENDENT_AMBULATORY_CARE_PROVIDER_SITE_OTHER)
Admission: RE | Admit: 2016-09-07 | Discharge: 2016-09-07 | Disposition: A | Discharge status: Home / Self Care | Payer: Medicare PPO | Source: Ambulatory Visit | Attending: Internal Medicine | Admitting: Internal Medicine

## 2016-09-07 VITALS — BP 126/84 | HR 76 | Ht 66.0 in | Wt 164.0 lb

## 2016-09-07 DIAGNOSIS — L989 Disorder of the skin and subcutaneous tissue, unspecified: Secondary | ICD-10-CM | POA: Diagnosis not present

## 2016-09-07 DIAGNOSIS — E2839 Other primary ovarian failure: Secondary | ICD-10-CM

## 2016-09-07 DIAGNOSIS — Z0000 Encounter for general adult medical examination without abnormal findings: Secondary | ICD-10-CM

## 2016-09-07 DIAGNOSIS — R7303 Prediabetes: Secondary | ICD-10-CM

## 2016-09-07 DIAGNOSIS — J309 Allergic rhinitis, unspecified: Secondary | ICD-10-CM | POA: Diagnosis not present

## 2016-09-07 DIAGNOSIS — Z23 Encounter for immunization: Secondary | ICD-10-CM | POA: Diagnosis not present

## 2016-09-07 LAB — TSH: TSH: 0.62 u[IU]/mL (ref 0.35–4.50)

## 2016-09-07 LAB — CBC WITH DIFFERENTIAL/PLATELET
BASOS PCT: 0.8 % (ref 0.0–3.0)
Basophils Absolute: 0.1 10*3/uL (ref 0.0–0.1)
EOS PCT: 1.4 % (ref 0.0–5.0)
Eosinophils Absolute: 0.1 10*3/uL (ref 0.0–0.7)
HCT: 43.7 % (ref 36.0–46.0)
HEMOGLOBIN: 14.7 g/dL (ref 12.0–15.0)
LYMPHS ABS: 2.2 10*3/uL (ref 0.7–4.0)
Lymphocytes Relative: 21 % (ref 12.0–46.0)
MCHC: 33.6 g/dL (ref 30.0–36.0)
MCV: 93.3 fl (ref 78.0–100.0)
Monocytes Absolute: 1.1 10*3/uL — ABNORMAL HIGH (ref 0.1–1.0)
Monocytes Relative: 10.2 % (ref 3.0–12.0)
NEUTROS ABS: 6.9 10*3/uL (ref 1.4–7.7)
Neutrophils Relative %: 66.6 % (ref 43.0–77.0)
PLATELETS: 209 10*3/uL (ref 150.0–400.0)
RBC: 4.68 Mil/uL (ref 3.87–5.11)
RDW: 13.5 % (ref 11.5–15.5)
WBC: 10.4 10*3/uL (ref 4.0–10.5)

## 2016-09-07 LAB — URINALYSIS, ROUTINE W REFLEX MICROSCOPIC
Bilirubin Urine: NEGATIVE
Ketones, ur: NEGATIVE
Leukocytes, UA: NEGATIVE
NITRITE: NEGATIVE
SPECIFIC GRAVITY, URINE: 1.015 (ref 1.000–1.030)
Total Protein, Urine: NEGATIVE
URINE GLUCOSE: NEGATIVE
Urobilinogen, UA: 0.2 (ref 0.0–1.0)
pH: 6.5 (ref 5.0–8.0)

## 2016-09-07 LAB — HEPATIC FUNCTION PANEL
ALBUMIN: 4.3 g/dL (ref 3.5–5.2)
ALT: 13 U/L (ref 0–35)
AST: 15 U/L (ref 0–37)
Alkaline Phosphatase: 84 U/L (ref 39–117)
Bilirubin, Direct: 0.1 mg/dL (ref 0.0–0.3)
TOTAL PROTEIN: 7.5 g/dL (ref 6.0–8.3)
Total Bilirubin: 0.5 mg/dL (ref 0.2–1.2)

## 2016-09-07 LAB — LIPID PANEL
Cholesterol: 140 mg/dL (ref 0–200)
HDL: 47.8 mg/dL (ref >39.00)
LDL Cholesterol: 59 mg/dL (ref 0–99)
NonHDL: 91.7
TRIGLYCERIDES: 164 mg/dL — AB (ref 0.0–149.0)
Total CHOL/HDL Ratio: 3
VLDL: 32.8 mg/dL (ref 0.0–40.0)

## 2016-09-07 LAB — BASIC METABOLIC PANEL
BUN: 10 mg/dL (ref 6–23)
CHLORIDE: 104 meq/L (ref 96–112)
CO2: 28 meq/L (ref 19–32)
Calcium: 10.7 mg/dL — ABNORMAL HIGH (ref 8.4–10.5)
Creatinine, Ser: 0.82 mg/dL (ref 0.40–1.20)
GFR: 73.37 mL/min (ref >60.00)
GLUCOSE: 104 mg/dL — AB (ref 70–99)
POTASSIUM: 4.4 meq/L (ref 3.5–5.1)
Sodium: 137 mEq/L (ref 135–145)

## 2016-09-07 LAB — HEMOGLOBIN A1C: Hgb A1c MFr Bld: 6.5 % (ref 4.6–6.5)

## 2016-09-07 MED ORDER — LISINOPRIL 20 MG PO TABS: 20.0000 mg | ORAL_TABLET | Freq: Every day | ORAL | 3 refills | Status: DC

## 2016-09-07 MED ORDER — AMLODIPINE BESYLATE 5 MG PO TABS: 5.0000 mg | ORAL_TABLET | Freq: Every day | ORAL | 3 refills | Status: DC

## 2016-09-07 MED ORDER — PRAVASTATIN SODIUM 20 MG PO TABS: 20.0000 mg | ORAL_TABLET | Freq: Every day | ORAL | 3 refills | Status: DC

## 2016-09-07 MED ORDER — FLUTICASONE PROPIONATE 50 MCG/ACT NA SUSP: 2.0000 | Freq: Every day | NASAL | 3 refills | Status: DC

## 2016-09-07 MED ORDER — VENTOLIN HFA 108 (90 BASE) MCG/ACT IN AERS: INHALATION_SPRAY | RESPIRATORY_TRACT | 3 refills | Status: DC

## 2016-09-07 NOTE — Assessment & Plan Note (Signed)
Cant r/o ca - for derm referral

## 2016-09-07 NOTE — Assessment & Plan Note (Signed)

## 2016-09-07 NOTE — Patient Instructions (Signed)
Please continue all other medications as before, and refills have been done if requested, including the flonase  Please have the pharmacy call with any other refills you may need.  Please continue your efforts at being more active, low cholesterol diet, and weight control.  You are otherwise up to date with prevention measures today.  Please keep your appointments with your specialists as you may have planned  You will be contacted regarding the referral for: Dermatology  Please schedule the bone density test before leaving today at the scheduling desk (where you check out)  Please go to the LAB in the Basement (turn left off the elevator) for the tests to be done today  You will be contacted by phone if any changes need to be made immediately.  Otherwise, you will receive a letter about your results with an explanation, but please check with MyChart first.  Please remember to sign up for MyChart if you have not done so, as this will be important to you in the future with finding out test results, communicating by private email, and scheduling acute appointments online when needed.  Please return in 6 months, or sooner if needed, with Lab testing done 3-5 days before

## 2016-09-07 NOTE — Progress Notes (Signed)
Subjective:    Patient ID: Leah Mata, female    DOB: 11/05/1945, 71 y.o.   MRN: [id number]  HPI    Here for wellness and f/u;  Overall doing ok;  Pt denies Chest pain, worsening SOB, DOE, wheezing, orthopnea, PND, worsening LE edema, palpitations, dizziness or syncope.  Pt denies neurological change such as new headache, facial or extremity weakness.  Pt denies polydipsia, polyuria, or low sugar symptoms. Pt states overall good compliance with treatment and medications, good tolerability, and has been trying to follow appropriate diet.  Pt denies worsening depressive symptoms, suicidal ideation or panic. No fever, night sweats, wt loss, loss of appetite, or other constitutional symptoms.  Pt states good ability with ADL's, has low fall risk, home safety reviewed and adequate, no other significant changes in hearing or vision, and only occasionally active with exercise. Still smoking, not ready to quit.  Does have several wks ongoing nasal allergy symptoms with clearish congestion, itch and sneezing, without fever, pain, ST, cough, swelling or wheezing - [facility]  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 BACK PAIN 03/14/2009  . COPD 11/01/2006  . GLUCOSE INTOLERANCE 03/14/2009  . Headache(784.0) 03/14/2009  . HYPERTENSION 11/01/2006  . 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 INSOMNIA, HX OF 01/06/2007  . Other and unspecified hyperlipidemia 08/22/2013  . Urinary frequency 03/14/2009   Past Surgical History:  Procedure Laterality Date  . CARDIAC CATHETERIZATION  2003   negative  . DILATION AND CURETTAGE OF UTERUS    . TONSILLECTOMY    . TUBAL LIGATION      reports that she has been smoking.  She has never used smokeless tobacco. She reports that she does not drink alcohol or use drugs. family history includes Congestive Heart  Failure in her mother; Coronary artery disease in her other; Diabetes in her father and mother; Heart attack in her father; Hypertension in her mother. Allergies  Allergen Reactions  . Hydrochlorothiazide    Current Outpatient Prescriptions on File Prior to Visit  Medication Sig Dispense Refill  . amLODipine (NORVASC) 5 MG tablet TAKE 1 TABLET EVERY DAY 90 tablet 2  . lisinopril (PRINIVIL,ZESTRIL) 20 MG tablet TAKE 1 TABLET EVERY DAY 90 tablet 2  . pravastatin (PRAVACHOL) 20 MG tablet TAKE 1 TABLET EVERY DAY 90 tablet 2  . temazepam (RESTORIL) 15 MG capsule TAKE  1 CAPSULES BY MOUTH AT BEDTIME AS NEEDED 90 capsule 1  . VENTOLIN HFA 108 (90 Base) MCG/ACT inhaler INHALE 2 PUFFS INTO THE LUNGS EVERY 6 (SIX) HOURS AS NEEDED FOR WHEEZING OR SHORTNESS OF BREATH.  2   No current facility-administered medications on file prior to visit.      Review of Systems  Constitutional: Negative for other unusual diaphoresis, sweats, appetite or weight changes HENT: Negative for other worsening hearing loss, ear pain, facial swelling, mouth sores or neck stiffness.   Eyes: Negative for other worsening pain, redness or other visual disturbance.  Respiratory: Negative for other stridor or swelling Cardiovascular: Negative for other palpitations or other chest pain  Gastrointestinal: Negative for worsening diarrhea or loose stools, blood in stool, distention or other pain Genitourinary: Negative for hematuria, flank pain or other change in urine volume.  Musculoskeletal: Negative for myalgias or other joint swelling.  Skin: Negative for other color change, or other wound or  worsening drainage.  Neurological: Negative for other syncope or numbness. Hematological: Negative for other adenopathy or swelling Psychiatric/Behavioral: Negative for hallucinations, other worsening agitation, SI, self-injury, or new decreased concentration All other system neg per pt    Objective:   Physical Exam BP 126/84   Pulse  76   Ht 5\' 6"  (1.676 m)   Wt 164 lb (74.4 kg)   LMP 03/29/1993 (Approximate)   SpO2 97%   BMI 26.47 kg/m  VS noted,  Constitutional: Pt is oriented to person, place, and time. Appears well-developed and well-nourished, in no significant distress and comfortable Head: Normocephalic and atraumatic  Eyes: Conjunctivae and EOM are normal. Pupils are equal, round, and reactive to light Right Ear: External ear normal without discharge Left Ear: External ear normal without discharge Nose: Nose without discharge or deformity Mouth/Throat: Oropharynx is without other ulcerations and moist  Neck: Normal range of motion. Neck supple. No JVD present. No tracheal deviation present or significant neck LA or mass Bilat tm's with mild erythema.  Max sinus areas non tender.  Pharynx with mild erythema, no exudate Cardiovascular: Normal rate, regular rhythm, normal heart sounds and intact distal pulses.   Pulmonary/Chest: WOB normal and breath sounds without rales or wheezing  Abdominal: Soft. Bowel sounds are normal. NT. No HSM  Musculoskeletal: Normal range of motion. Exhibits no edema Lymphadenopathy: Has no other cervical adenopathy.  Neurological: Pt is alert and oriented to person, place, and time. Pt has normal reflexes. No cranial nerve deficit. Motor grossly intact, Gait intact Skin: Skin is warm and dry. No rash noted or new ulcerations but has < 1/2 cm rough scaly raised tan lesion left check just above the mid jawline. Psychiatric:  Has normal mood and affect. Behavior is normal without agitation No other exam findings    Assessment & Plan:

## 2016-09-07 NOTE — Assessment & Plan Note (Signed)
Asympt, for a1c 

## 2016-09-07 NOTE — Assessment & Plan Note (Signed)
Mild, for restart flonase

## 2016-09-15 ENCOUNTER — Other Ambulatory Visit: Payer: Self-pay

## 2016-09-15 MED ORDER — TEMAZEPAM 15 MG PO CAPS: ORAL_CAPSULE | ORAL | 1 refills | Status: DC

## 2016-09-15 NOTE — Telephone Encounter (Signed)
Done hardcopy to Shirron  

## 2016-09-15 NOTE — Telephone Encounter (Signed)
faxed

## 2016-09-27 ENCOUNTER — Other Ambulatory Visit: Payer: Self-pay | Admitting: Internal Medicine

## 2016-09-27 ENCOUNTER — Encounter: Payer: Self-pay | Admitting: Internal Medicine

## 2016-09-27 MED ORDER — ALENDRONATE SODIUM 70 MG PO TABS: 70.0000 mg | ORAL_TABLET | ORAL | 3 refills | Status: DC

## 2016-09-28 ENCOUNTER — Telehealth: Payer: Self-pay

## 2016-09-28 NOTE — Telephone Encounter (Signed)
-----   Message from Corwin LevinsJames W John, MD sent at 09/27/2016  7:14 PM EDT ----- Letter sent, cont same tx except  The test results show that your current treatment is OK, except the bone density testing is consistent with Osteoporosis.  We should treat with fosamax 70 mg weekly to reduce the future risk of bone fractures.  I will send a new prescription, and you should be informed by the offic as well.  Abbygail Willhoite to please inform pt, I will do rx

## 2016-09-28 NOTE — Telephone Encounter (Signed)
Called pt, Leah Mata.   

## 2016-11-04 ENCOUNTER — Other Ambulatory Visit: Payer: Self-pay | Admitting: Obstetrics & Gynecology

## 2016-11-04 DIAGNOSIS — Z1231 Encounter for screening mammogram for malignant neoplasm of breast: Secondary | ICD-10-CM

## 2016-11-05 ENCOUNTER — Ambulatory Visit
Admission: RE | Admit: 2016-11-05 | Discharge: 2016-11-05 | Disposition: A | Discharge status: Home / Self Care | Payer: Medicare PPO | Source: Ambulatory Visit | Attending: Obstetrics & Gynecology | Admitting: Obstetrics & Gynecology

## 2016-11-05 DIAGNOSIS — Z1231 Encounter for screening mammogram for malignant neoplasm of breast: Secondary | ICD-10-CM

## 2017-03-09 ENCOUNTER — Ambulatory Visit: Payer: Medicare PPO | Admitting: Internal Medicine

## 2017-03-10 ENCOUNTER — Ambulatory Visit: Payer: Commercial Managed Care - HMO

## 2017-03-12 ENCOUNTER — Encounter: Payer: Self-pay | Admitting: Internal Medicine

## 2017-03-12 ENCOUNTER — Ambulatory Visit: Payer: Medicare PPO | Admitting: Internal Medicine

## 2017-03-12 VITALS — BP 126/78 | HR 86 | Temp 98.5°F | Ht 66.0 in | Wt 165.0 lb

## 2017-03-12 DIAGNOSIS — J449 Chronic obstructive pulmonary disease, unspecified: Secondary | ICD-10-CM

## 2017-03-12 DIAGNOSIS — R7303 Prediabetes: Secondary | ICD-10-CM | POA: Diagnosis not present

## 2017-03-12 DIAGNOSIS — E785 Hyperlipidemia, unspecified: Secondary | ICD-10-CM

## 2017-03-12 DIAGNOSIS — I10 Essential (primary) hypertension: Secondary | ICD-10-CM

## 2017-03-12 DIAGNOSIS — Z23 Encounter for immunization: Secondary | ICD-10-CM | POA: Diagnosis not present

## 2017-03-12 LAB — POCT GLYCOSYLATED HEMOGLOBIN (HGB A1C): HEMOGLOBIN A1C: 5.8

## 2017-03-12 MED ORDER — TRIAMCINOLONE ACETONIDE 0.1 % EX CREA: 1.0000 "application " | TOPICAL_CREAM | Freq: Two times a day (BID) | CUTANEOUS | 0 refills | Status: DC

## 2017-03-12 MED ORDER — FLUTICASONE PROPIONATE 50 MCG/ACT NA SUSP: 2.0000 | Freq: Every day | NASAL | 3 refills | Status: DC

## 2017-03-12 MED ORDER — ALBUTEROL SULFATE HFA 108 (90 BASE) MCG/ACT IN AERS: 2.0000 | INHALATION_SPRAY | Freq: Four times a day (QID) | RESPIRATORY_TRACT | 3 refills | Status: DC | PRN

## 2017-03-12 MED ORDER — AMLODIPINE BESYLATE 5 MG PO TABS: 5.0000 mg | ORAL_TABLET | Freq: Every day | ORAL | 3 refills | Status: DC

## 2017-03-12 MED ORDER — LISINOPRIL 20 MG PO TABS: 20.0000 mg | ORAL_TABLET | Freq: Every day | ORAL | 3 refills | Status: DC

## 2017-03-12 MED ORDER — TEMAZEPAM 15 MG PO CAPS: ORAL_CAPSULE | ORAL | 1 refills | Status: DC

## 2017-03-12 MED ORDER — ALENDRONATE SODIUM 70 MG PO TABS: 70.0000 mg | ORAL_TABLET | ORAL | 3 refills | Status: DC

## 2017-03-12 MED ORDER — PRAVASTATIN SODIUM 20 MG PO TABS: 20.0000 mg | ORAL_TABLET | Freq: Every day | ORAL | 3 refills | Status: DC

## 2017-03-12 NOTE — Patient Instructions (Addendum)
You had the flu shot today  Your A1c test was OK today  Please continue all other medications as before, and refills have been done if requested.  Please have the pharmacy call with any other refills you may need.  Please continue your efforts at being more active, low cholesterol diet, and weight control.  Please keep your appointments with your specialists as you may have planned  Please return in 6 months, or sooner if needed, with Lab testing done 3-5 days before

## 2017-03-12 NOTE — Progress Notes (Signed)
   Subjective:    Patient ID: Leah Mata, female    DOB: 09/05/1945, 71 y.o.   MRN: [id number]  HPI     Here to f/u; overall doing ok,  Pt denies chest pain, increasing sob or doe, wheezing, orthopnea, PND, increased LE swelling, palpitations, dizziness or syncope.  Pt denies new neurological symptoms such as new headache, or facial or extremity weakness or numbness.  Pt denies polydipsia, polyuria, or low sugar episode.  Pt states overall good compliance with meds, mostly trying to follow appropriate diet, with wt overall stable,  but little exercise however. Not yet started fosamax but willing, was sent to wrong pharmacy in past.  No other complaints Past Medical History:  Diagnosis Date  . BACK PAIN 03/14/2009  . COPD 11/01/2006  . GLUCOSE INTOLERANCE 03/14/2009  . Headache(784.0) 03/14/2009  . HYPERTENSION 11/01/2006  . 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 INSOMNIA, HX OF 01/06/2007  . Other and unspecified hyperlipidemia 08/22/2013  . Urinary frequency 03/14/2009   Past Surgical History:  Procedure Laterality Date  . CARDIAC CATHETERIZATION  2003   negative  . DILATION AND CURETTAGE OF UTERUS    . TONSILLECTOMY    . TUBAL LIGATION      reports that she has been smoking.  she has never used smokeless tobacco. She reports that she does not drink alcohol or use drugs. family history includes Congestive Heart Failure in her mother; Coronary artery disease in her other; Diabetes in her father and mother; Heart attack in her father; Hypertension in her mother. Allergies  Allergen Reactions  . Hydrochlorothiazide    No current outpatient medications on file prior to visit.   No current facility-administered medications on file prior to visit.    Review of Systems  Constitutional: Negative for other unusual diaphoresis or sweats HENT: Negative for ear discharge or swelling Eyes: Negative for other worsening visual disturbances Respiratory: Negative for stridor or other swelling   Gastrointestinal: Negative for worsening distension or other blood Genitourinary: Negative for retention or other urinary change Musculoskeletal: Negative for other MSK pain or swelling Skin: Negative for color change or other new lesions Neurological: Negative for worsening tremors and other numbness  Psychiatric/Behavioral: Negative for worsening agitation or other fatigue No other exam findings    Objective:   Physical Exam BP 126/78 (BP Location: Left Arm, Patient Position: Sitting, Cuff Size: Large)   Pulse 86   Temp 98.5 F (36.9 C) (Oral)   Ht 5\' 6"  (1.676 m)   Wt 165 lb (74.8 kg)   LMP 03/29/1993 (Approximate)   SpO2 98%   BMI 26.63 kg/m  VS noted,  Constitutional: Pt appears in NAD HENT: Head: NCAT.  Right Ear: External ear normal.  Left Ear: External ear normal.  Eyes: . Pupils are equal, round, and reactive to light. Conjunctivae and EOM are normal Nose: without d/c or deformity Neck: Neck supple. Gross normal ROM Cardiovascular: Normal rate and regular rhythm.   Pulmonary/Chest: Effort normal and breath sounds mild decreased without rales or wheezing.  Neurological: Pt is alert. At baseline orientation, motor grossly intact Skin: Skin is warm. No rashes, other new lesions, no LE edema Psychiatric: Pt behavior is normal without agitation  No other exam findings  POCT HgB A1C  Component 11:47  Hemoglobin A1C 5.8            Assessment & Plan:

## 2017-03-13 NOTE — Assessment & Plan Note (Signed)
stable overall by history and exam, recent data reviewed with pt, and pt to continue medical treatment as before,  to f/u any worsening symptoms or concerns  

## 2017-03-13 NOTE — Assessment & Plan Note (Addendum)
stable overall by history and exam, and pt to continue medical treatment as before,  to f/u any worsening symptoms or concerns 

## 2017-03-13 NOTE — Assessment & Plan Note (Signed)
BP Readings from Last 3 Encounters:  03/12/17 126/78  09/07/16 126/84  03/12/16 100/60  stable overall by history and exam, recent data reviewed with pt, and pt to continue medical treatment as before,  to f/u any worsening symptoms or concerns

## 2017-03-13 NOTE — Assessment & Plan Note (Signed)
stable overall by history and exam, recent data reviewed with pt, and pt to continue medical treatment as before,  to f/u any worsening symptoms or concerns Lab Results  Component Value Date   LDLCALC 59 09/07/2016

## 2017-06-23 ENCOUNTER — Ambulatory Visit: Payer: Commercial Managed Care - HMO | Admitting: Obstetrics & Gynecology

## 2017-11-17 ENCOUNTER — Ambulatory Visit (INDEPENDENT_AMBULATORY_CARE_PROVIDER_SITE_OTHER): Payer: Medicare PPO | Admitting: Internal Medicine

## 2017-11-17 ENCOUNTER — Encounter: Payer: Self-pay | Admitting: Internal Medicine

## 2017-11-17 DIAGNOSIS — M79602 Pain in left arm: Secondary | ICD-10-CM | POA: Insufficient documentation

## 2017-11-17 MED ORDER — SILVER SULFADIAZINE 1 % EX CREA: 1.0000 "application " | TOPICAL_CREAM | Freq: Every day | CUTANEOUS | 0 refills | Status: DC

## 2017-11-17 NOTE — Patient Instructions (Addendum)
This does not look infected today.   We have sent in silvadene cream to use once a day on the wound to keep it from getting infected.   This may take another 1-2 weeks to heal. Do not use peroxide on wounds.

## 2017-11-17 NOTE — Progress Notes (Signed)
   Subjective:    Patient ID: Leah JewelLinda D Hage, female    DOB: 11/11/1945, 72 y.o.   MRN: [id number]  HPI The patient is a 72 YO female coming in for pain left arm. She [facility] and it closed quicker than expected. It tore her skin which is very fragile. She had bleeding initially and then covered it. She has been using peroxide on it daily which seems to open it back up again and make it raw. There is some redness around the wound. Seems to be not healing. Happened about 9 days ago.   Review of Systems  Constitutional: Negative.   Respiratory: Negative for cough, chest tightness and shortness of breath.   Cardiovascular: Negative for chest pain, palpitations and leg swelling.  Gastrointestinal: Negative for abdominal distention, abdominal pain, constipation, diarrhea, nausea and vomiting.  Musculoskeletal: Negative.   Skin: Positive for color change and wound.  Neurological: Negative.   Psychiatric/Behavioral: Negative.       Objective:   Physical Exam  Constitutional: She is oriented to person, place, and time. She appears well-developed and well-nourished.  HENT:  Head: Normocephalic and atraumatic.  Eyes: EOM are normal.  Neck: Normal range of motion.  Cardiovascular: Normal rate and regular rhythm.  Pulmonary/Chest: Effort normal and breath sounds normal. No respiratory distress. She has no wheezes. She has no rales.  Musculoskeletal: She exhibits no edema.  Neurological: She is alert and oriented to person, place, and time. Coordination normal.  Skin: Skin is warm and dry.  Wound left forearm about 4 cm by 3 cm rectangle shape, with some redness around the wound rim, covered with eschar, no purulent drainage or abscess appreciated, no cellulitis surrounding.    Vitals:   11/17/17 1307  BP: 140/80  Pulse: 81  Temp: 98 F (36.7 C)  TempSrc: Oral  SpO2: 96%  Weight: 162 lb (73.5 kg)  Height: 5\' 6"  (1.676 m)      Assessment & Plan:

## 2017-11-17 NOTE — Assessment & Plan Note (Signed)
Suggested that she stop using peroxide as this can inhibit new skin growth. Rx for silvadene to prevent infection. Tdap up to date.

## 2017-11-21 ENCOUNTER — Other Ambulatory Visit: Payer: Self-pay | Admitting: Obstetrics & Gynecology

## 2017-11-21 DIAGNOSIS — Z1231 Encounter for screening mammogram for malignant neoplasm of breast: Secondary | ICD-10-CM

## 2017-12-20 ENCOUNTER — Ambulatory Visit
Admission: RE | Admit: 2017-12-20 | Discharge: 2017-12-20 | Disposition: A | Discharge status: Home / Self Care | Payer: Medicare PPO | Source: Ambulatory Visit | Attending: Obstetrics & Gynecology | Admitting: Obstetrics & Gynecology

## 2017-12-20 DIAGNOSIS — Z1231 Encounter for screening mammogram for malignant neoplasm of breast: Secondary | ICD-10-CM | POA: Diagnosis not present

## 2018-02-02 ENCOUNTER — Encounter: Payer: Medicare PPO | Admitting: Internal Medicine

## 2018-04-06 ENCOUNTER — Encounter: Payer: Self-pay | Admitting: Internal Medicine

## 2018-04-06 ENCOUNTER — Ambulatory Visit: Payer: Medicare PPO | Admitting: Internal Medicine

## 2018-04-06 VITALS — BP 160/100 | HR 82 | Temp 98.5°F | Ht 66.0 in | Wt 161.5 lb

## 2018-04-06 DIAGNOSIS — I10 Essential (primary) hypertension: Secondary | ICD-10-CM | POA: Diagnosis not present

## 2018-04-06 DIAGNOSIS — J441 Chronic obstructive pulmonary disease with (acute) exacerbation: Secondary | ICD-10-CM

## 2018-04-06 DIAGNOSIS — Z23 Encounter for immunization: Secondary | ICD-10-CM | POA: Diagnosis not present

## 2018-04-06 DIAGNOSIS — J069 Acute upper respiratory infection, unspecified: Secondary | ICD-10-CM | POA: Diagnosis not present

## 2018-04-06 DIAGNOSIS — R7303 Prediabetes: Secondary | ICD-10-CM

## 2018-04-06 MED ORDER — AMLODIPINE BESYLATE 5 MG PO TABS: 5.0000 mg | ORAL_TABLET | Freq: Every day | ORAL | 3 refills | Status: DC

## 2018-04-06 MED ORDER — LISINOPRIL 20 MG PO TABS: 20.0000 mg | ORAL_TABLET | Freq: Every day | ORAL | 3 refills | Status: DC

## 2018-04-06 MED ORDER — HYDROCODONE-HOMATROPINE 5-1.5 MG/5ML PO SYRP: 5.0000 mL | ORAL_SOLUTION | Freq: Four times a day (QID) | ORAL | 0 refills | Status: AC | PRN

## 2018-04-06 MED ORDER — PRAVASTATIN SODIUM 20 MG PO TABS: 20.0000 mg | ORAL_TABLET | Freq: Every day | ORAL | 3 refills | Status: DC

## 2018-04-06 MED ORDER — DOXYCYCLINE HYCLATE 100 MG PO TABS: 100.0000 mg | ORAL_TABLET | Freq: Two times a day (BID) | ORAL | 0 refills | Status: DC

## 2018-04-06 MED ORDER — PREDNISONE 10 MG PO TABS: ORAL_TABLET | ORAL | 0 refills | Status: DC

## 2018-04-06 NOTE — Patient Instructions (Signed)
Please take all new medication as prescribed - the antibiotic, cough medicine, and prednisone  Please continue all other medications as before, including the home inhaler as needed  Please have the pharmacy call with any other refills you may need.  Please keep your appointments with your specialists as you may have planned

## 2018-04-06 NOTE — Assessment & Plan Note (Signed)
Mild to mod, for antibx course,  to f/u any worsening symptoms or concerns 

## 2018-04-06 NOTE — Assessment & Plan Note (Signed)
stable overall by history and exam, recent data reviewed with pt, and pt to continue medical treatment as before,  to f/u any worsening symptoms or concerns  

## 2018-04-06 NOTE — Progress Notes (Signed)
Subjective:    Patient ID: Leah Mata, female    DOB: 07/14/1945, 73 y.o.   MRN: [id number]  HPI   Here with 2-3 days acute onset fever, facial pain, pressure, headache, general weakness and malaise, and greenish d/c, with mild ST and cough, but pt denies chest pain, wheezing, increased sob or doe, orthopnea, PND, increased LE swelling, palpitations, dizziness or syncope, except for onset mild wheezing last PM.  Pt denies new neurological symptoms such as new headache, or facial or extremity weakness or numbness   Pt denies polydipsia, polyuria.   Past Medical History:  Diagnosis Date  . BACK PAIN 03/14/2009  . COPD 11/01/2006  . GLUCOSE INTOLERANCE 03/14/2009  . Headache(784.0) 03/14/2009  . HYPERTENSION 11/01/2006  . 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 INSOMNIA, HX OF 01/06/2007  . Other and unspecified hyperlipidemia 08/22/2013  . Urinary frequency 03/14/2009   Past Surgical History:  Procedure Laterality Date  . CARDIAC CATHETERIZATION  2003   negative  . DILATION AND CURETTAGE OF UTERUS    . TONSILLECTOMY    . TUBAL LIGATION      reports that she has been smoking. She has never used smokeless tobacco. She reports that she does not drink alcohol or use drugs. family history includes Congestive Heart Failure in her mother; Coronary artery disease in an other family member; Diabetes in her father and mother; Heart attack in her father; Hypertension in her mother. Allergies  Allergen Reactions  . Hydrochlorothiazide    Current Outpatient Medications on File Prior to Visit  Medication Sig Dispense Refill  . albuterol (VENTOLIN HFA) 108 (90 Base) MCG/ACT inhaler Inhale 2 puffs into the lungs every 6 (six) hours as needed for wheezing or shortness of breath. 3 Inhaler 3  . alendronate (FOSAMAX) 70 MG tablet Take 1 tablet (70 mg total) by mouth every 7 (seven) days. Take with a full glass of water on an empty stomach. 12 tablet 3  . silver sulfADIAZINE (SILVADENE) 1 % cream  Apply 1 application topically daily. 50 g 0  . temazepam (RESTORIL) 15 MG capsule TAKE  1 CAPSULES BY MOUTH AT BEDTIME AS NEEDED 90 capsule 1  . fluticasone (FLONASE) 50 MCG/ACT nasal spray Place 2 sprays into both nostrils daily. 48 g 3   No current facility-administered medications on file prior to visit.    Review of Systems  Constitutional: Negative for other unusual diaphoresis or sweats HENT: Negative for ear discharge or swelling Eyes: Negative for other worsening visual disturbances Respiratory: Negative for stridor or other swelling  Gastrointestinal: Negative for worsening distension or other blood Genitourinary: Negative for retention or other urinary change Musculoskeletal: Negative for other MSK pain or swelling Skin: Negative for color change or other new lesions Neurological: Negative for worsening tremors and other numbness  Psychiatric/Behavioral: Negative for worsening agitation or other fatigue All other system neg per pt    Objective:   Physical Exam BP (!) 160/100 (BP Location: Left Arm, Patient Position: Sitting, Cuff Size: Large)   Pulse 82   Temp 98.5 F (36.9 C) (Oral)   Ht 5\' 6"  (1.676 m)   Wt 161 lb 8 oz (73.3 kg)   LMP 03/29/1993 (Approximate)   SpO2 96%   BMI 26.07 kg/m  VS noted, mild ill Constitutional: Pt appears in NAD HENT: Head: NCAT.  Right Ear: External ear normal.  Left Ear: External ear normal.  Bilat tm's with mild erythema.  Max sinus areas mild tender.  Pharynx with mild erythema,  no exudate Eyes: . Pupils are equal, round, and reactive to light. Conjunctivae and EOM are normal Nose: without d/c or deformity Neck: Neck supple. Gross normal ROM Cardiovascular: Normal rate and regular rhythm.   Pulmonary/Chest: Effort normal and breath sounds decreased without rales but with few trace bilat wheezing.  Abd:  Soft, NT, ND, + BS, no organomegaly Neurological: Pt is alert. At baseline orientation, motor grossly intact Skin: Skin is warm.  No rashes, other new lesions, no LE edema Psychiatric: Pt behavior is normal without agitation  No other exam findings Lab Results  Component Value Date   WBC 10.4 09/07/2016   HGB 14.7 09/07/2016   HCT 43.7 09/07/2016   PLT 209.0 09/07/2016   GLUCOSE 104 (H) 09/07/2016   CHOL 140 09/07/2016   TRIG 164.0 (H) 09/07/2016   HDL 47.80 09/07/2016   LDLDIRECT 84.1 01/31/2014   LDLCALC 59 09/07/2016   ALT 13 09/07/2016   AST 15 09/07/2016   NA 137 09/07/2016   K 4.4 09/07/2016   CL 104 09/07/2016   CREATININE 0.82 09/07/2016   BUN 10 09/07/2016   CO2 28 09/07/2016   TSH 0.62 09/07/2016   HGBA1C 5.8 03/12/2017        Assessment & Plan:

## 2018-04-06 NOTE — Assessment & Plan Note (Signed)
Mild for predpac asd , cont home inhaler,  to f/u any worsening symptoms or concerns

## 2018-04-26 DIAGNOSIS — J209 Acute bronchitis, unspecified: Secondary | ICD-10-CM | POA: Diagnosis not present

## 2018-04-28 ENCOUNTER — Emergency Department (HOSPITAL_COMMUNITY): Payer: Medicare PPO

## 2018-04-28 ENCOUNTER — Emergency Department (HOSPITAL_COMMUNITY): Payer: Medicare PPO | Admitting: Certified Registered"

## 2018-04-28 ENCOUNTER — Encounter (HOSPITAL_COMMUNITY)
Admission: EM | Disposition: A | Discharge status: Home Health | Payer: Self-pay | Source: Home / Self Care | Attending: Vascular Surgery

## 2018-04-28 ENCOUNTER — Encounter (HOSPITAL_COMMUNITY): Payer: Self-pay

## 2018-04-28 ENCOUNTER — Other Ambulatory Visit: Payer: Self-pay

## 2018-04-28 ENCOUNTER — Inpatient Hospital Stay (HOSPITAL_COMMUNITY)
Admission: EM | Admit: 2018-04-28 | Discharge: 2018-05-30 | DRG: 252 | Disposition: A | Discharge status: Home Health | Payer: Medicare PPO | Attending: Vascular Surgery | Admitting: Vascular Surgery

## 2018-04-28 DIAGNOSIS — I4892 Unspecified atrial flutter: Secondary | ICD-10-CM | POA: Diagnosis not present

## 2018-04-28 DIAGNOSIS — R140 Abdominal distension (gaseous): Secondary | ICD-10-CM

## 2018-04-28 DIAGNOSIS — J811 Chronic pulmonary edema: Secondary | ICD-10-CM | POA: Diagnosis not present

## 2018-04-28 DIAGNOSIS — F17210 Nicotine dependence, cigarettes, uncomplicated: Secondary | ICD-10-CM | POA: Diagnosis present

## 2018-04-28 DIAGNOSIS — I4891 Unspecified atrial fibrillation: Secondary | ICD-10-CM | POA: Diagnosis not present

## 2018-04-28 DIAGNOSIS — K56609 Unspecified intestinal obstruction, unspecified as to partial versus complete obstruction: Secondary | ICD-10-CM

## 2018-04-28 DIAGNOSIS — R739 Hyperglycemia, unspecified: Secondary | ICD-10-CM | POA: Diagnosis not present

## 2018-04-28 DIAGNOSIS — I714 Abdominal aortic aneurysm, without rupture, unspecified: Secondary | ICD-10-CM | POA: Diagnosis present

## 2018-04-28 DIAGNOSIS — R578 Other shock: Secondary | ICD-10-CM | POA: Diagnosis not present

## 2018-04-28 DIAGNOSIS — Z978 Presence of other specified devices: Secondary | ICD-10-CM

## 2018-04-28 DIAGNOSIS — Z8249 Family history of ischemic heart disease and other diseases of the circulatory system: Secondary | ICD-10-CM

## 2018-04-28 DIAGNOSIS — D696 Thrombocytopenia, unspecified: Secondary | ICD-10-CM | POA: Diagnosis not present

## 2018-04-28 DIAGNOSIS — R1110 Vomiting, unspecified: Secondary | ICD-10-CM | POA: Diagnosis not present

## 2018-04-28 DIAGNOSIS — R1031 Right lower quadrant pain: Secondary | ICD-10-CM | POA: Diagnosis not present

## 2018-04-28 DIAGNOSIS — I713 Abdominal aortic aneurysm, ruptured, unspecified: Secondary | ICD-10-CM

## 2018-04-28 DIAGNOSIS — I5031 Acute diastolic (congestive) heart failure: Secondary | ICD-10-CM | POA: Diagnosis not present

## 2018-04-28 DIAGNOSIS — J449 Chronic obstructive pulmonary disease, unspecified: Secondary | ICD-10-CM | POA: Diagnosis present

## 2018-04-28 DIAGNOSIS — Z4682 Encounter for fitting and adjustment of non-vascular catheter: Secondary | ICD-10-CM | POA: Diagnosis not present

## 2018-04-28 DIAGNOSIS — J9811 Atelectasis: Secondary | ICD-10-CM | POA: Diagnosis not present

## 2018-04-28 DIAGNOSIS — K567 Ileus, unspecified: Secondary | ICD-10-CM

## 2018-04-28 DIAGNOSIS — Z888 Allergy status to other drugs, medicaments and biological substances status: Secondary | ICD-10-CM

## 2018-04-28 DIAGNOSIS — R0902 Hypoxemia: Secondary | ICD-10-CM | POA: Diagnosis not present

## 2018-04-28 DIAGNOSIS — N179 Acute kidney failure, unspecified: Secondary | ICD-10-CM

## 2018-04-28 DIAGNOSIS — D62 Acute posthemorrhagic anemia: Secondary | ICD-10-CM | POA: Diagnosis not present

## 2018-04-28 DIAGNOSIS — K6389 Other specified diseases of intestine: Secondary | ICD-10-CM | POA: Diagnosis not present

## 2018-04-28 DIAGNOSIS — I5043 Acute on chronic combined systolic (congestive) and diastolic (congestive) heart failure: Secondary | ICD-10-CM | POA: Diagnosis not present

## 2018-04-28 DIAGNOSIS — N170 Acute kidney failure with tubular necrosis: Secondary | ICD-10-CM | POA: Diagnosis not present

## 2018-04-28 DIAGNOSIS — E871 Hypo-osmolality and hyponatremia: Secondary | ICD-10-CM | POA: Diagnosis not present

## 2018-04-28 DIAGNOSIS — Z833 Family history of diabetes mellitus: Secondary | ICD-10-CM | POA: Diagnosis not present

## 2018-04-28 DIAGNOSIS — J690 Pneumonitis due to inhalation of food and vomit: Secondary | ICD-10-CM | POA: Diagnosis not present

## 2018-04-28 DIAGNOSIS — R9431 Abnormal electrocardiogram [ECG] [EKG]: Secondary | ICD-10-CM | POA: Diagnosis not present

## 2018-04-28 DIAGNOSIS — R918 Other nonspecific abnormal finding of lung field: Secondary | ICD-10-CM | POA: Diagnosis not present

## 2018-04-28 DIAGNOSIS — Z8719 Personal history of other diseases of the digestive system: Secondary | ICD-10-CM

## 2018-04-28 DIAGNOSIS — R0602 Shortness of breath: Secondary | ICD-10-CM

## 2018-04-28 DIAGNOSIS — J9584 Transfusion-related acute lung injury (TRALI): Secondary | ICD-10-CM

## 2018-04-28 DIAGNOSIS — G4700 Insomnia, unspecified: Secondary | ICD-10-CM | POA: Diagnosis present

## 2018-04-28 DIAGNOSIS — J90 Pleural effusion, not elsewhere classified: Secondary | ICD-10-CM | POA: Diagnosis not present

## 2018-04-28 DIAGNOSIS — J9600 Acute respiratory failure, unspecified whether with hypoxia or hypercapnia: Secondary | ICD-10-CM | POA: Diagnosis not present

## 2018-04-28 DIAGNOSIS — I130 Hypertensive heart and chronic kidney disease with heart failure and stage 1 through stage 4 chronic kidney disease, or unspecified chronic kidney disease: Secondary | ICD-10-CM | POA: Diagnosis not present

## 2018-04-28 DIAGNOSIS — E43 Unspecified severe protein-calorie malnutrition: Secondary | ICD-10-CM | POA: Diagnosis not present

## 2018-04-28 DIAGNOSIS — K9189 Other postprocedural complications and disorders of digestive system: Secondary | ICD-10-CM

## 2018-04-28 DIAGNOSIS — J189 Pneumonia, unspecified organism: Secondary | ICD-10-CM

## 2018-04-28 DIAGNOSIS — J9601 Acute respiratory failure with hypoxia: Secondary | ICD-10-CM | POA: Diagnosis not present

## 2018-04-28 DIAGNOSIS — E876 Hypokalemia: Secondary | ICD-10-CM | POA: Diagnosis not present

## 2018-04-28 DIAGNOSIS — N189 Chronic kidney disease, unspecified: Secondary | ICD-10-CM | POA: Diagnosis present

## 2018-04-28 DIAGNOSIS — I480 Paroxysmal atrial fibrillation: Secondary | ICD-10-CM | POA: Diagnosis not present

## 2018-04-28 DIAGNOSIS — Z7951 Long term (current) use of inhaled steroids: Secondary | ICD-10-CM | POA: Diagnosis not present

## 2018-04-28 DIAGNOSIS — Z4659 Encounter for fitting and adjustment of other gastrointestinal appliance and device: Secondary | ICD-10-CM

## 2018-04-28 DIAGNOSIS — J9621 Acute and chronic respiratory failure with hypoxia: Secondary | ICD-10-CM | POA: Diagnosis not present

## 2018-04-28 DIAGNOSIS — D72829 Elevated white blood cell count, unspecified: Secondary | ICD-10-CM

## 2018-04-28 DIAGNOSIS — E7800 Pure hypercholesterolemia, unspecified: Secondary | ICD-10-CM | POA: Diagnosis not present

## 2018-04-28 DIAGNOSIS — J9690 Respiratory failure, unspecified, unspecified whether with hypoxia or hypercapnia: Secondary | ICD-10-CM | POA: Diagnosis not present

## 2018-04-28 DIAGNOSIS — E785 Hyperlipidemia, unspecified: Secondary | ICD-10-CM | POA: Diagnosis present

## 2018-04-28 DIAGNOSIS — Z6831 Body mass index (BMI) 31.0-31.9, adult: Secondary | ICD-10-CM

## 2018-04-28 DIAGNOSIS — L8990 Pressure ulcer of unspecified site, unspecified stage: Secondary | ICD-10-CM

## 2018-04-28 DIAGNOSIS — I483 Typical atrial flutter: Secondary | ICD-10-CM | POA: Diagnosis not present

## 2018-04-28 DIAGNOSIS — I10 Essential (primary) hypertension: Secondary | ICD-10-CM | POA: Diagnosis not present

## 2018-04-28 DIAGNOSIS — R112 Nausea with vomiting, unspecified: Secondary | ICD-10-CM | POA: Diagnosis not present

## 2018-04-28 DIAGNOSIS — R601 Generalized edema: Secondary | ICD-10-CM | POA: Diagnosis not present

## 2018-04-28 DIAGNOSIS — I5033 Acute on chronic diastolic (congestive) heart failure: Secondary | ICD-10-CM | POA: Diagnosis not present

## 2018-04-28 DIAGNOSIS — K560 Paralytic ileus: Secondary | ICD-10-CM | POA: Diagnosis not present

## 2018-04-28 DIAGNOSIS — Z95828 Presence of other vascular implants and grafts: Secondary | ICD-10-CM | POA: Diagnosis not present

## 2018-04-28 HISTORY — PX: ABDOMINAL AORTIC ANEURYSM REPAIR: SHX42

## 2018-04-28 LAB — CBC WITH DIFFERENTIAL/PLATELET
ABS IMMATURE GRANULOCYTES: 0.07 10*3/uL (ref 0.00–0.07)
Basophils Absolute: 0 10*3/uL (ref 0.0–0.1)
Basophils Relative: 0 %
Eosinophils Absolute: 0 10*3/uL (ref 0.0–0.5)
Eosinophils Relative: 0 %
HEMATOCRIT: 37 % (ref 36.0–46.0)
HEMOGLOBIN: 11.7 g/dL — AB (ref 12.0–15.0)
Immature Granulocytes: 1 %
LYMPHS PCT: 14 %
Lymphs Abs: 1.5 10*3/uL (ref 0.7–4.0)
MCH: 30.3 pg (ref 26.0–34.0)
MCHC: 31.6 g/dL (ref 30.0–36.0)
MCV: 95.9 fL (ref 80.0–100.0)
MONO ABS: 0.4 10*3/uL (ref 0.1–1.0)
MONOS PCT: 3 %
NEUTROS ABS: 9.1 10*3/uL — AB (ref 1.7–7.7)
Neutrophils Relative %: 82 %
Platelets: 260 10*3/uL (ref 150–400)
RBC: 3.86 MIL/uL — ABNORMAL LOW (ref 3.87–5.11)
RDW: 12.7 % (ref 11.5–15.5)
WBC: 11.1 10*3/uL — ABNORMAL HIGH (ref 4.0–10.5)
nRBC: 0 % (ref 0.0–0.2)

## 2018-04-28 LAB — PROTIME-INR
INR: 1
Prothrombin Time: 13.1 seconds (ref 11.4–15.2)

## 2018-04-28 LAB — ABO/RH: ABO/RH(D): O POS

## 2018-04-28 LAB — PREPARE RBC (CROSSMATCH)

## 2018-04-28 LAB — COMPREHENSIVE METABOLIC PANEL
ALBUMIN: 3.6 g/dL (ref 3.5–5.0)
ALT: 16 U/L (ref 0–44)
AST: 20 U/L (ref 15–41)
Alkaline Phosphatase: 65 U/L (ref 38–126)
Anion gap: 11 (ref 5–15)
BILIRUBIN TOTAL: 0.6 mg/dL (ref 0.3–1.2)
BUN: 13 mg/dL (ref 8–23)
CO2: 20 mmol/L — ABNORMAL LOW (ref 22–32)
Calcium: 9.3 mg/dL (ref 8.9–10.3)
Chloride: 102 mmol/L (ref 98–111)
Creatinine, Ser: 1 mg/dL (ref 0.44–1.00)
GFR calc Af Amer: >60 mL/min (ref >60)
GFR calc non Af Amer: 57 mL/min — ABNORMAL LOW (ref >60)
GLUCOSE: 180 mg/dL — AB (ref 70–99)
POTASSIUM: 4.2 mmol/L (ref 3.5–5.1)
Sodium: 133 mmol/L — ABNORMAL LOW (ref 135–145)
TOTAL PROTEIN: 6.7 g/dL (ref 6.5–8.1)

## 2018-04-28 LAB — SAMPLE TO BLOOD BANK

## 2018-04-28 SURGERY — ANEURYSM ABDOMINAL AORTIC REPAIR
Anesthesia: General

## 2018-04-28 MED ORDER — VASOPRESSIN 20 UNIT/ML IV SOLN
INTRAVENOUS | Status: DC | PRN
  Administered 2018-04-28: 4 [IU] via INTRAVENOUS
  Administered 2018-04-28: 8 [IU] via INTRAVENOUS
  Administered 2018-04-28: 4 [IU] via INTRAVENOUS

## 2018-04-28 MED ORDER — IOPAMIDOL (ISOVUE-300) INJECTION 61%
100.0000 mL | Freq: Once | INTRAVENOUS | Status: AC | PRN
  Administered 2018-04-28: 100 mL via INTRAVENOUS

## 2018-04-28 MED ORDER — SODIUM CHLORIDE 0.9 % IV SOLN: 10.0000 mL/h | Freq: Once | INTRAVENOUS | Status: DC

## 2018-04-28 MED ORDER — LACTATED RINGERS IV SOLN
INTRAVENOUS | Status: DC | PRN
  Administered 2018-04-28 (×4): via INTRAVENOUS

## 2018-04-28 MED ORDER — HEPARIN SODIUM (PORCINE) 1000 UNIT/ML IJ SOLN
INTRAMUSCULAR | Status: DC | PRN
  Administered 2018-04-28: 6000 [IU] via INTRAVENOUS

## 2018-04-28 MED ORDER — SODIUM CHLORIDE 0.9 % IV SOLN
INTRAVENOUS | Status: DC | PRN
  Administered 2018-04-28 (×2): via INTRAVENOUS

## 2018-04-28 MED ORDER — ALBUMIN HUMAN 5 % IV SOLN
INTRAVENOUS | Status: DC | PRN
  Administered 2018-04-28: 21:00:00 via INTRAVENOUS

## 2018-04-28 MED ORDER — SODIUM BICARBONATE 8.4 % IV SOLN
INTRAVENOUS | Status: AC
  Filled 2018-04-28: qty 50

## 2018-04-28 MED ORDER — ONDANSETRON HCL 4 MG/2ML IJ SOLN
4.0000 mg | Freq: Once | INTRAMUSCULAR | Status: AC
  Administered 2018-04-28: 4 mg via INTRAVENOUS
  Filled 2018-04-28: qty 2

## 2018-04-28 MED ORDER — SUFENTANIL CITRATE 50 MCG/ML IV SOLN
INTRAVENOUS | Status: DC | PRN
  Administered 2018-04-28 (×8): 10 ug via INTRAVENOUS

## 2018-04-28 MED ORDER — CEFAZOLIN SODIUM-DEXTROSE 2-3 GM-%(50ML) IV SOLR
INTRAVENOUS | Status: DC | PRN
  Administered 2018-04-28: 2 g via INTRAVENOUS

## 2018-04-28 MED ORDER — SODIUM CHLORIDE 0.9 % IV SOLN
INTRAVENOUS | Status: DC | PRN
  Administered 2018-04-28: 50 ug/min via INTRAVENOUS

## 2018-04-28 MED ORDER — CALCIUM CHLORIDE 10 % IV SOLN
INTRAVENOUS | Status: DC | PRN
  Administered 2018-04-28: 200 mg via INTRAVENOUS
  Administered 2018-04-28: 100 mg via INTRAVENOUS
  Administered 2018-04-28: 200 mg via INTRAVENOUS

## 2018-04-28 MED ORDER — VASOPRESSIN 20 UNIT/ML IV SOLN
0.0300 [IU]/min | INTRAVENOUS | Status: DC
  Filled 2018-04-28: qty 2

## 2018-04-28 MED ORDER — ROCURONIUM BROMIDE 50 MG/5ML IV SOSY
PREFILLED_SYRINGE | INTRAVENOUS | Status: DC | PRN
  Administered 2018-04-28 (×4): 50 mg via INTRAVENOUS

## 2018-04-28 MED ORDER — SODIUM CHLORIDE 0.9 % IV SOLN
INTRAVENOUS | Status: DC | PRN
  Administered 2018-04-28: 500 mL

## 2018-04-28 MED ORDER — PROTAMINE SULFATE 10 MG/ML IV SOLN
INTRAVENOUS | Status: DC | PRN
  Administered 2018-04-28: 50 mg via INTRAVENOUS

## 2018-04-28 MED ORDER — FENTANYL CITRATE (PF) 100 MCG/2ML IJ SOLN: 25.0000 ug | Freq: Once | INTRAMUSCULAR | Status: DC

## 2018-04-28 MED ORDER — 0.9 % SODIUM CHLORIDE (POUR BTL) OPTIME
TOPICAL | Status: DC | PRN
  Administered 2018-04-28: 3000 mL

## 2018-04-28 MED ORDER — SODIUM BICARBONATE 4.2 % IV SOLN
INTRAVENOUS | Status: DC | PRN
  Administered 2018-04-28: 100 meq via INTRAVENOUS

## 2018-04-28 MED ORDER — LIDOCAINE 2% (20 MG/ML) 5 ML SYRINGE
INTRAMUSCULAR | Status: DC | PRN
  Administered 2018-04-28: 100 mg via INTRAVENOUS

## 2018-04-28 MED ORDER — FENTANYL CITRATE (PF) 100 MCG/2ML IJ SOLN
50.0000 ug | Freq: Once | INTRAMUSCULAR | Status: AC
  Administered 2018-04-28: 50 ug via INTRAVENOUS
  Filled 2018-04-28: qty 2

## 2018-04-28 SURGICAL SUPPLY — 46 items
CANISTER SUCT 3000ML PPV (MISCELLANEOUS) ×2 IMPLANT
CANNULA VESSEL 3MM 2 BLNT TIP (CANNULA) ×4 IMPLANT
CLIP LIGATING EXTRA MED SLVR (CLIP) ×2 IMPLANT
CLIP LIGATING EXTRA SM BLUE (MISCELLANEOUS) ×2 IMPLANT
COVER WAND RF STERILE (DRAPES) ×2 IMPLANT
DRSG COVADERM 4X14 (GAUZE/BANDAGES/DRESSINGS) ×2 IMPLANT
DRSG COVADERM 4X8 (GAUZE/BANDAGES/DRESSINGS) ×2 IMPLANT
ELECT BLADE 4.0 EZ CLEAN MEGAD (MISCELLANEOUS) ×2
ELECT REM PT RETURN 9FT ADLT (ELECTROSURGICAL) ×4
ELECTRODE BLDE 4.0 EZ CLN MEGD (MISCELLANEOUS) ×1 IMPLANT
ELECTRODE REM PT RTRN 9FT ADLT (ELECTROSURGICAL) ×2 IMPLANT
FELT TEFLON 1X6 (MISCELLANEOUS) ×2 IMPLANT
GLOVE BIO SURGEON STRL SZ 6.5 (GLOVE) ×2 IMPLANT
GLOVE BIO SURGEON STRL SZ7.5 (GLOVE) ×2 IMPLANT
GLOVE BIOGEL PI IND STRL 7.0 (GLOVE) ×3 IMPLANT
GLOVE BIOGEL PI IND STRL 7.5 (GLOVE) ×2 IMPLANT
GLOVE BIOGEL PI INDICATOR 7.0 (GLOVE) ×3
GLOVE BIOGEL PI INDICATOR 7.5 (GLOVE) ×2
GLOVE SS BIOGEL STRL SZ 7.5 (GLOVE) ×1 IMPLANT
GLOVE SUPERSENSE BIOGEL SZ 7.5 (GLOVE) ×1
GOWN STRL REUS W/ TWL LRG LVL3 (GOWN DISPOSABLE) ×4 IMPLANT
GOWN STRL REUS W/TWL LRG LVL3 (GOWN DISPOSABLE) ×4
GRAFT HEMASHIELD 22X11M (Vascular Products) ×2 IMPLANT
INSERT FOGARTY 61MM (MISCELLANEOUS) IMPLANT
INSERT FOGARTY SM (MISCELLANEOUS) ×4 IMPLANT
KIT BASIN OR (CUSTOM PROCEDURE TRAY) ×2 IMPLANT
KIT TURNOVER KIT B (KITS) ×2 IMPLANT
NS IRRIG 1000ML POUR BTL (IV SOLUTION) ×6 IMPLANT
PACK AORTA (CUSTOM PROCEDURE TRAY) ×2 IMPLANT
PAD ARMBOARD 7.5X6 YLW CONV (MISCELLANEOUS) ×4 IMPLANT
SPONGE LAP 18X18 X RAY DECT (DISPOSABLE) IMPLANT
STAPLER VISISTAT 35W (STAPLE) IMPLANT
SUT ETHIBOND 5 LR DA (SUTURE) IMPLANT
SUT PDS AB 1 TP1 54 (SUTURE) ×4 IMPLANT
SUT PROLENE 2 0 SH DA (SUTURE) ×2 IMPLANT
SUT PROLENE 3 0 SH1 36 (SUTURE) ×16 IMPLANT
SUT PROLENE 5 0 C 1 24 (SUTURE) IMPLANT
SUT PROLENE 5 0 C 1 36 (SUTURE) ×6 IMPLANT
SUT SILK 2 0 SH CR/8 (SUTURE) ×2 IMPLANT
SUT VIC AB 2-0 CT1 36 (SUTURE) ×4 IMPLANT
SUT VIC AB 3-0 SH 27 (SUTURE) ×4
SUT VIC AB 3-0 SH 27X BRD (SUTURE) ×2 IMPLANT
TOWEL GREEN STERILE (TOWEL DISPOSABLE) ×2 IMPLANT
TOWEL SURG RFD BLUE STRL DISP (DISPOSABLE) ×4 IMPLANT
TRAY FOLEY MTR SLVR 16FR STAT (SET/KITS/TRAYS/PACK) ×2 IMPLANT
WATER STERILE IRR 1000ML POUR (IV SOLUTION) ×4 IMPLANT

## 2018-04-28 NOTE — ED Provider Notes (Signed)
[facility]ERGENCY DEPARTMENT Provider Note   CSN: [id number] Arrival date & time: 04/28/18  1534     History   Chief Complaint Chief Complaint  Patient presents with  . Abdominal Pain    HPI Leah Mata is a 73 y.o. female.  HPI Patient presents with right-sided abdominal pain.  Began acutely a little afternoon today.  Pain is severe.  Does go to the back.  No dysuria.  States has had nausea and some vomiting.  No diarrhea.  Has not had pains except for same.  States she is urinated and had a bowel movement with no relief of pain.  Does not feel better after vomiting.  Patient reportedly had looked pale and blood pressures been lower earlier. Past Medical History:  Diagnosis Date  . BACK PAIN 03/14/2009  . COPD 11/01/2006  . GLUCOSE INTOLERANCE 03/14/2009  . Headache(784.0) 03/14/2009  . HYPERTENSION 11/01/2006  . 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 INSOMNIA, HX OF 01/06/2007  . Other and unspecified hyperlipidemia 08/22/2013  . Urinary frequency 03/14/2009    Patient Active Problem List   Diagnosis Date Noted  . Acute upper respiratory infection 04/06/2018  . Left arm pain 11/17/2017  . Skin lesion of face 09/07/2016  . Allergic rhinitis 09/07/2016  . Microhematuria 03/03/2016  . Hypercalcemia 08/12/2015  . Cough 12/19/2014  . Bilateral hearing loss 12/19/2014  . Hyperlipidemia 08/22/2013  . Smoker 12/01/2012  . COPD exacerbation (HCC) 02/24/2011  . 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 Pre-diabetes 08/19/2010  . Preventative health care 08/19/2010  . INSOMNIA-SLEEP DISORDER-UNSPEC 06/05/2009  . BACK PAIN 03/14/2009  . Essential hypertension 11/01/2006  . COPD (chronic obstructive pulmonary disease) (HCC) 11/01/2006    Past Surgical History:  Procedure Laterality Date  . CARDIAC CATHETERIZATION  2003   negative  . DILATION AND CURETTAGE OF UTERUS    . TONSILLECTOMY    . TUBAL LIGATION       OB History    Gravida  2   Para  2   Term  0   Preterm  0   AB  0   Living  2     SAB  0   TAB  0   Ectopic  0   Multiple  0   Live Births  2            Home Medications    Prior to Admission medications   Medication Sig Start Date End Date Taking? Authorizing Provider  albuterol (VENTOLIN HFA) 108 (90 Base) MCG/ACT inhaler Inhale 2 puffs into the lungs every 6 (six) hours as needed for wheezing or shortness of breath. 03/12/17  Yes Corwin LevinsJohn, James W, MD  amLODipine (NORVASC) 5 MG tablet Take 1 tablet (5 mg total) by mouth daily. 04/06/18  Yes Corwin LevinsJohn, James W, MD  fluticasone [facility](FLONASE) 50 MCG/ACT nasal spray Place 2 sprays into both nostrils daily. 03/12/17 04/28/18 Yes Corwin LevinsJohn, James W, MD  lisinopril (PRINIVIL,ZESTRIL) 20 MG tablet Take 1 tablet (20 mg total) by mouth daily. 04/06/18  Yes Corwin LevinsJohn, James W, MD  pravastatin (PRAVACHOL) 20 MG tablet Take 1 tablet (20 mg total) by mouth daily. 04/06/18  Yes Corwin LevinsJohn, James W, MD  doxycycline (VIBRA-TABS) 100 MG tablet Take 1 tablet (100 mg total) by mouth 2 (two) times daily. Patient not taking: Reported on 04/28/2018 04/06/18   Corwin LevinsJohn, James W, MD  predniSONE (DELTASONE) 10 MG tablet 3 tabs by mouth per day for 3 days,2tabs per day for 3 days,1tab per day for  3 days Patient not taking: Reported on 04/28/2018 04/06/18   Corwin Levins, MD  silver sulfADIAZINE (SILVADENE) 1 % cream Apply 1 application topically daily. Patient not taking: Reported on 04/28/2018 11/17/17   Myrlene Broker, MD  temazepam (RESTORIL) 15 MG capsule TAKE  1 CAPSULES BY MOUTH AT BEDTIME AS NEEDED Patient not taking: Reported on 04/28/2018 03/12/17   Corwin Levins, MD    Family History Family History  Problem Relation Age of Onset  . Coronary artery disease Other        female 1st degree relative <60 and female 1st degree relative <50  . Diabetes Father   . Heart attack Father   . Diabetes Mother   . Hypertension Mother   . Congestive Heart Failure Mother   . Colon cancer Neg Hx   . Breast cancer Neg Hx     Social  History Social History   Tobacco Use  . Smoking status: Current Every Day Smoker  . Smokeless tobacco: Never Used  . Tobacco comment: 2 packs per week  Substance Use Topics  . Alcohol use: No  . Drug use: No     Allergies   Hydrochlorothiazide   Review of Systems Review of Systems  Constitutional: Negative for appetite change and fever.  HENT: Negative for congestion.   Respiratory: Negative for shortness of breath.   Cardiovascular: Negative for chest pain.  Gastrointestinal: Positive for abdominal pain, nausea and vomiting.  Genitourinary: Negative for flank pain.  Musculoskeletal: Positive for back pain.  Skin: Negative for rash.  Neurological: Negative for weakness.  Psychiatric/Behavioral: Negative for confusion.     Physical Exam Updated Vital Signs BP 99/60   Pulse 73   Temp (!) 97.5 F (36.4 C) (Oral)   Resp 20   Ht 5\' 9"  (1.753 m)   Wt 69.9 kg   LMP 03/29/1993 (Approximate)   SpO2 91%   BMI 22.74 kg/m   Physical Exam HENT:     Head: Normocephalic.  Cardiovascular:     Rate and Rhythm: Regular rhythm.  Pulmonary:     Breath sounds: Normal breath sounds.  Abdominal:     Tenderness: There is abdominal tenderness.     Hernia: No hernia is present.     Comments: Right mid right lower abdominal tenderness.  No hernia.  No rebound or guarding.  Skin:    General: Skin is warm.     Capillary Refill: Capillary refill takes less than 2 seconds.  Neurological:     Mental Status: She is alert and oriented to person, place, and time.  Psychiatric:        Mood and Affect: Mood normal.      ED Treatments / Results  Labs (all labs ordered are listed, but only abnormal results are displayed) Labs Reviewed  COMPREHENSIVE METABOLIC PANEL - Abnormal; Notable for the following components:      Result Value   Sodium 133 (*)    CO2 20 (*)    Glucose, Bld 180 (*)    GFR calc non Af Amer 57 (*)    All other components within normal limits  CBC WITH  DIFFERENTIAL/PLATELET - Abnormal; Notable for the following components:   WBC 11.1 (*)    RBC 3.86 (*)    Hemoglobin 11.7 (*)    Neutro Abs 9.1 (*)    All other components within normal limits  URINALYSIS, ROUTINE W REFLEX MICROSCOPIC  PROTIME-INR  SAMPLE TO BLOOD BANK  PREPARE RBC (CROSSMATCH)  EKG None  Radiology Ct Abdomen Pelvis W Contrast  Result Date: 04/28/2018 CLINICAL DATA:  Right lower quadrant pain, nausea. EXAM: CT ABDOMEN AND PELVIS WITH CONTRAST TECHNIQUE: Multidetector CT imaging of the abdomen and pelvis was performed using the standard protocol following bolus administration of intravenous contrast. CONTRAST:  100mL ISOVUE-300 IOPAMIDOL (ISOVUE-300) INJECTION 61% COMPARISON:  None. FINDINGS: Lower chest: Heart is normal size. Descending thoracic aorta is heavily calcified. Dependent atelectasis in the lower lobes. No effusions. Hepatobiliary: Scattered hypodensities in the liver, likely small cysts. Gallbladder unremarkable. Pancreas: No focal abnormality or ductal dilatation. Spleen: No focal abnormality.  Normal size. Adrenals/Urinary Tract: No adrenal abnormality. No focal renal abnormality. No stones or hydronephrosis. Urinary bladder is unremarkable. Stomach/Bowel: Stomach, large and small bowel grossly unremarkable. Vascular/Lymphatic: There is an 8.3 cm infrarenal abdominal aortic aneurysm. Extensive thrombus/plaque noted. In addition, abnormal soft tissue is noted adjacent to the right side of the aneurysm and extending around the right kidney and in the right retroperitoneum compatible with large hematoma, likely related to aortic rupture. No visible active extravasation. Aneurysm in at the aortic bifurcation. Iliac vessels are heavily calcified, normal caliber. No adenopathy. Reproductive: Uterus and adnexa unremarkable.  No mass. Other: Large right retroperitoneal hematoma noted surrounding the right kidney and extending into the upper pelvis. No free fluid or free  air. Bilateral inguinal hernias noted containing fat. Musculoskeletal: No acute bony abnormality. IMPRESSION: 8.3 cm infrarenal abdominal aortic aneurysm with large right retroperitoneal hematoma most compatible with aortic rupture. Critical Value/emergent results were called by telephone at the time of interpretation on 04/28/2018 at 6:40 pm to Dr. Benjiman CoreNATHAN Jaydynn Wolford , who verbally acknowledged these results. Electronically Signed   By: Charlett NoseKevin  Dover M.D.   On: 04/28/2018 18:40    Procedures Procedures (including critical care time)  Medications Ordered in ED Medications  0.9 %  sodium chloride infusion (has no administration in time range)  fentaNYL (SUBLIMAZE) injection 50 mcg (50 mcg Intravenous Given 04/28/18 1636)  ondansetron (ZOFRAN) injection 4 mg (4 mg Intravenous Given 04/28/18 1633)  iopamidol (ISOVUE-300) 61 % injection 100 mL (100 mLs Intravenous Contrast Given 04/28/18 1816)     Initial Impression / Assessment and Plan / ED Course  I have reviewed the triage vital signs and the nursing notes.  Pertinent labs & imaging results that were available during my care of the patient were reviewed by me and considered in my medical decision making (see chart for details).     Patient with abdominal pain.  Acute onset few hours prior to arrival.  Has some mild hypotension here.  Hemoglobin mildly decreased from baseline.  CT scan done and showed ruptured AAA.  There is retroperitoneal blood but no flush of contrast.  [facility]  Accepted in transfer to the operating room.  O- blood will be given due to the hypotension although it has improved on most recent blood pressure.  CRITICAL CARE Performed by: Benjiman CoreNathan Neshia Mckenzie Total critical care time: 30 minutes Critical care time was exclusive of separately billable procedures and treating other patients. Critical care was necessary to treat or prevent imminent or life-threatening deterioration. Critical care  was time spent personally by me on the following activities: development of treatment plan with patient and/or surrogate as well as nursing, discussions with consultants, evaluation of patient's response to treatment, examination of patient, obtaining history from patient or surrogate, ordering and performing treatments and interventions, ordering and review of laboratory studies, ordering and review of radiographic  studies, pulse oximetry and re-evaluation of patient's condition.   Final Clinical Impressions(s) / ED Diagnoses   Final diagnoses:  Ruptured abdominal aortic aneurysm (AAA) [facility])    ED Discharge Orders    None       Benjiman Core, MD 04/28/18 [id number]

## 2018-04-28 NOTE — ED Triage Notes (Signed)
Pt is having lower right quadrant pain. Started 1230 today. Had a BM after pain started today. Nausea as well.

## 2018-04-28 NOTE — Anesthesia Procedure Notes (Signed)
Central Venous Catheter Insertion Performed by: Achille Rich, MD, anesthesiologist Start/End1/31/2020 7:42 PM, 04/28/2018 7:50 PM Patient location: OR. Emergency situation Preanesthetic checklist: patient identified, IV checked, site marked, risks and benefits discussed, surgical consent, monitors and equipment checked, pre-op evaluation, timeout performed and anesthesia consent Position: Trendelenburg Lidocaine 1% used for infiltration and patient sedated Hand hygiene performed , maximum sterile barriers used  and Seldinger technique used Catheter size: 8.5 Fr Central line was placed.Sheath introducer Swan type:thermodilation Procedure performed using ultrasound guided technique. Ultrasound Notes:anatomy identified, needle tip was noted to be adjacent to the nerve/plexus identified, no ultrasound evidence of intravascular and/or intraneural injection and image(s) printed for medical record Attempts: 1 Following insertion, line sutured, dressing applied and Biopatch. Post procedure assessment: blood return through all ports, free fluid flow and no air  Patient tolerated the procedure well with no immediate complications.

## 2018-04-28 NOTE — Anesthesia Procedure Notes (Signed)
Procedure Name: Intubation Date/Time: 04/28/2018 8:08 PM Performed by: Claris Che, CRNA Pre-anesthesia Checklist: Patient identified, Emergency Drugs available, Suction available, Patient being monitored and Timeout performed Patient Re-evaluated:Patient Re-evaluated prior to induction Oxygen Delivery Method: Circle system utilized Preoxygenation: Pre-oxygenation with 100% oxygen Induction Type: IV induction, Rapid sequence and Cricoid Pressure applied Laryngoscope Size: Mac and 3 Grade View: Grade II Tube type: Oral Tube size: 7.5 mm Number of attempts: 1 Airway Equipment and Method: Stylet Placement Confirmation: ETT inserted through vocal cords under direct vision,  positive ETCO2 and breath sounds checked- equal and bilateral Secured at: 22 cm Tube secured with: Tape Dental Injury: Teeth and Oropharynx as per pre-operative assessment

## 2018-04-28 NOTE — ED Notes (Signed)
[facility] is on the way to transport to [facility].

## 2018-04-28 NOTE — Anesthesia Preprocedure Evaluation (Signed)
Anesthesia Evaluation  Patient identified by MRN, date of birth, ID band Patient awake  Preop documentation limited or incomplete due to emergent nature of procedure.  Airway Mallampati: II   Neck ROM: full    Dental  (+) Edentulous Upper, Edentulous Lower   Pulmonary COPD, Current Smoker,    breath sounds clear to auscultation       Cardiovascular hypertension,  Rhythm:regular Rate:Normal  Ruptured AAA.  Arrived in OR for emergency surgery.   Neuro/Psych  Headaches,    GI/Hepatic   Endo/Other    Renal/GU      Musculoskeletal   Abdominal   Peds  Hematology   Anesthesia Other Findings   Reproductive/Obstetrics                             Anesthesia Physical Anesthesia Plan  ASA: IV and emergent  Anesthesia Plan: General   Post-op Pain Management:    Induction: Intravenous  PONV Risk Score and Plan: 2 and Ondansetron, Dexamethasone, Midazolam and Treatment may vary due to age or medical condition  Airway Management Planned: Oral ETT  Additional Equipment: Arterial line, CVP, PA Cath and Ultrasound Guidance Line Placement  Intra-op Plan:   Post-operative Plan: Post-operative intubation/ventilation  Informed Consent:   Plan Discussed with: CRNA, Surgeon and Anesthesiologist  Anesthesia Plan Comments:         Anesthesia Quick Evaluation

## 2018-04-28 NOTE — ED Notes (Signed)
Pt leaving with [facility], Emergency blood started, NS started, pt complaining of nausea and pain, [facility] with treat in route.

## 2018-04-28 NOTE — Anesthesia Procedure Notes (Signed)
Arterial Line Insertion Start/End1/31/2020 7:40 PM, 04/28/2018 7:42 PM Performed by: Sheppard Evens, CRNA  Patient location: OR. Preanesthetic checklist: patient identified and pre-op evaluation Lidocaine 1% used for infiltration radial was placed Catheter size: 20 G Hand hygiene performed  and maximum sterile barriers used   Attempts: 1 Procedure performed without using ultrasound guided technique. Following insertion, dressing applied and Biopatch. Post procedure assessment: normal  Patient tolerated the procedure well with no immediate complications.

## 2018-04-28 NOTE — ED Notes (Signed)
Called Report to OR, they are ready for Patient.

## 2018-04-29 ENCOUNTER — Inpatient Hospital Stay (HOSPITAL_COMMUNITY): Payer: Medicare PPO

## 2018-04-29 DIAGNOSIS — I714 Abdominal aortic aneurysm, without rupture, unspecified: Secondary | ICD-10-CM | POA: Diagnosis present

## 2018-04-29 DIAGNOSIS — Z978 Presence of other specified devices: Secondary | ICD-10-CM

## 2018-04-29 LAB — POCT I-STAT 7, (LYTES, BLD GAS, ICA,H+H)
Acid-base deficit: 1 mmol/L (ref 0.0–2.0)
Acid-base deficit: 14 mmol/L — ABNORMAL HIGH (ref 0.0–2.0)
Acid-base deficit: 2 mmol/L (ref 0.0–2.0)
Acid-base deficit: 4 mmol/L — ABNORMAL HIGH (ref 0.0–2.0)
Acid-base deficit: 5 mmol/L — ABNORMAL HIGH (ref 0.0–2.0)
Acid-base deficit: 6 mmol/L — ABNORMAL HIGH (ref 0.0–2.0)
Acid-base deficit: 9 mmol/L — ABNORMAL HIGH (ref 0.0–2.0)
BICARBONATE: 21.8 mmol/L (ref 20.0–28.0)
Bicarbonate: 20.4 mmol/L (ref 20.0–28.0)
Bicarbonate: 21 mmol/L (ref 20.0–28.0)
Bicarbonate: 15.7 mmol/L — ABNORMAL LOW (ref 20.0–28.0)
Bicarbonate: 13.9 mmol/L — ABNORMAL LOW (ref 20.0–28.0)
Bicarbonate: 24.2 mmol/L (ref 20.0–28.0)
Bicarbonate: 25.2 mmol/L (ref 20.0–28.0)
Calcium, Ion: 0.74 mmol/L — CL (ref 1.15–1.40)
Calcium, Ion: 0.93 mmol/L — ABNORMAL LOW (ref 1.15–1.40)
Calcium, Ion: 1.03 mmol/L — ABNORMAL LOW (ref 1.15–1.40)
Calcium, Ion: 1.1 mmol/L — ABNORMAL LOW (ref 1.15–1.40)
Calcium, Ion: 1.13 mmol/L — ABNORMAL LOW (ref 1.15–1.40)
Calcium, Ion: 1.18 mmol/L (ref 1.15–1.40)
Calcium, Ion: 1.19 mmol/L (ref 1.15–1.40)
HCT: 21 % — ABNORMAL LOW (ref 36.0–46.0)
HCT: 22 % — ABNORMAL LOW (ref 36.0–46.0)
HCT: 25 % — ABNORMAL LOW (ref 36.0–46.0)
HCT: 33 % — ABNORMAL LOW (ref 36.0–46.0)
HCT: 37 % (ref 36.0–46.0)
HCT: 38 % (ref 36.0–46.0)
HEMATOCRIT: 39 % (ref 36.0–46.0)
Hemoglobin: 11.2 g/dL — ABNORMAL LOW (ref 12.0–15.0)
Hemoglobin: 12.6 g/dL (ref 12.0–15.0)
Hemoglobin: 12.9 g/dL (ref 12.0–15.0)
Hemoglobin: 13.3 g/dL (ref 12.0–15.0)
Hemoglobin: 7.1 g/dL — ABNORMAL LOW (ref 12.0–15.0)
Hemoglobin: 7.5 g/dL — ABNORMAL LOW (ref 12.0–15.0)
Hemoglobin: 8.5 g/dL — ABNORMAL LOW (ref 12.0–15.0)
O2 Saturation: 100 %
O2 Saturation: 100 %
O2 Saturation: 100 %
O2 Saturation: 93 %
O2 Saturation: 95 %
O2 Saturation: 95 %
O2 Saturation: 99 %
PCO2 ART: 36.6 mmHg (ref 32.0–48.0)
Patient temperature: 35.3
Patient temperature: 35.4
Patient temperature: 35.4
Patient temperature: 36.2
Patient temperature: 37
Potassium: 4 mmol/L (ref 3.5–5.1)
Potassium: 4.1 mmol/L (ref 3.5–5.1)
Potassium: 4.2 mmol/L (ref 3.5–5.1)
Potassium: 5.1 mmol/L (ref 3.5–5.1)
Potassium: 5.4 mmol/L — ABNORMAL HIGH (ref 3.5–5.1)
Potassium: 5.8 mmol/L — ABNORMAL HIGH (ref 3.5–5.1)
Potassium: 6.3 mmol/L (ref 3.5–5.1)
Sodium: 135 mmol/L (ref 135–145)
Sodium: 136 mmol/L (ref 135–145)
Sodium: 137 mmol/L (ref 135–145)
Sodium: 140 mmol/L (ref 135–145)
Sodium: 140 mmol/L (ref 135–145)
Sodium: 140 mmol/L (ref 135–145)
Sodium: 141 mmol/L (ref 135–145)
TCO2: 15 mmol/L — ABNORMAL LOW (ref 22–32)
TCO2: 16 mmol/L — ABNORMAL LOW (ref 22–32)
TCO2: 21 mmol/L — ABNORMAL LOW (ref 22–32)
TCO2: 22 mmol/L (ref 22–32)
TCO2: 23 mmol/L (ref 22–32)
TCO2: 26 mmol/L (ref 22–32)
TCO2: 27 mmol/L (ref 22–32)
pCO2 arterial: 25.7 mmHg — ABNORMAL LOW (ref 32.0–48.0)
pCO2 arterial: 31.6 mmHg — ABNORMAL LOW (ref 32.0–48.0)
pCO2 arterial: 44.2 mmHg (ref 32.0–48.0)
pCO2 arterial: 44.6 mmHg (ref 32.0–48.0)
pCO2 arterial: 46.3 mmHg (ref 32.0–48.0)
pCO2 arterial: 46.9 mmHg (ref 32.0–48.0)
pH, Arterial: 7.176 — CL (ref 7.350–7.450)
pH, Arterial: 7.26 — ABNORMAL LOW (ref 7.350–7.450)
pH, Arterial: 7.29 — ABNORMAL LOW (ref 7.350–7.450)
pH, Arterial: 7.343 — ABNORMAL LOW (ref 7.350–7.450)
pH, Arterial: 7.344 — ABNORMAL LOW (ref 7.350–7.450)
pH, Arterial: 7.385 (ref 7.350–7.450)
pH, Arterial: 7.418 (ref 7.350–7.450)
pO2, Arterial: 141 mmHg — ABNORMAL HIGH (ref 83.0–108.0)
pO2, Arterial: 179 mmHg — ABNORMAL HIGH (ref 83.0–108.0)
pO2, Arterial: 335 mmHg — ABNORMAL HIGH (ref 83.0–108.0)
pO2, Arterial: 464 mmHg — ABNORMAL HIGH (ref 83.0–108.0)
pO2, Arterial: 70 mmHg — ABNORMAL LOW (ref 83.0–108.0)
pO2, Arterial: 76 mmHg — ABNORMAL LOW (ref 83.0–108.0)
pO2, Arterial: 78 mmHg — ABNORMAL LOW (ref 83.0–108.0)

## 2018-04-29 LAB — CBC
HCT: 41.5 % (ref 36.0–46.0)
HCT: 40.6 % (ref 36.0–46.0)
HCT: 42.7 % (ref 36.0–46.0)
Hemoglobin: 13.6 g/dL (ref 12.0–15.0)
Hemoglobin: 13.9 g/dL (ref 12.0–15.0)
Hemoglobin: 13.9 g/dL (ref 12.0–15.0)
MCH: 29.2 pg (ref 26.0–34.0)
MCH: 30 pg (ref 26.0–34.0)
MCH: 29 pg (ref 26.0–34.0)
MCHC: 32.6 g/dL (ref 30.0–36.0)
MCHC: 32.8 g/dL (ref 30.0–36.0)
MCHC: 34.2 g/dL (ref 30.0–36.0)
MCV: 89.1 fL (ref 80.0–100.0)
MCV: 87.7 fL (ref 80.0–100.0)
MCV: 89 fL (ref 80.0–100.0)
Platelets: 42 10*3/uL — ABNORMAL LOW (ref 150–400)
Platelets: 42 10*3/uL — ABNORMAL LOW (ref 150–400)
Platelets: 47 10*3/uL — ABNORMAL LOW (ref 150–400)
RBC: 4.63 MIL/uL (ref 3.87–5.11)
RBC: 4.66 MIL/uL (ref 3.87–5.11)
RBC: 4.8 MIL/uL (ref 3.87–5.11)
RDW: 14.3 % (ref 11.5–15.5)
RDW: 14.9 % (ref 11.5–15.5)
RDW: 15.5 % (ref 11.5–15.5)
WBC: 10.2 10*3/uL (ref 4.0–10.5)
WBC: 11.4 10*3/uL — ABNORMAL HIGH (ref 4.0–10.5)
WBC: 9.6 10*3/uL (ref 4.0–10.5)
nRBC: 0 % (ref 0.0–0.2)
nRBC: 0 % (ref 0.0–0.2)
nRBC: 0 % (ref 0.0–0.2)

## 2018-04-29 LAB — DIC (DISSEMINATED INTRAVASCULAR COAGULATION)PANEL
Fibrinogen: 177 mg/dL — ABNORMAL LOW (ref 210–475)
INR: 1.33
Platelets: 42 10*3/uL — ABNORMAL LOW (ref 150–400)
Prothrombin Time: 16.3 seconds — ABNORMAL HIGH (ref 11.4–15.2)
Smear Review: NONE SEEN
aPTT: 32 seconds (ref 24–36)

## 2018-04-29 LAB — COMPREHENSIVE METABOLIC PANEL
ALBUMIN: 3 g/dL — AB (ref 3.5–5.0)
ALT: 63 U/L — ABNORMAL HIGH (ref 0–44)
AST: 97 U/L — ABNORMAL HIGH (ref 15–41)
Alkaline Phosphatase: 33 U/L — ABNORMAL LOW (ref 38–126)
Anion gap: 10 (ref 5–15)
BUN: 12 mg/dL (ref 8–23)
CO2: 22 mmol/L (ref 22–32)
Calcium: 8.1 mg/dL — ABNORMAL LOW (ref 8.9–10.3)
Chloride: 107 mmol/L (ref 98–111)
Creatinine, Ser: 1.25 mg/dL — ABNORMAL HIGH (ref 0.44–1.00)
GFR calc Af Amer: 50 mL/min — ABNORMAL LOW (ref >60)
GFR calc non Af Amer: 43 mL/min — ABNORMAL LOW (ref >60)
GLUCOSE: 150 mg/dL — AB (ref 70–99)
Potassium: 4 mmol/L (ref 3.5–5.1)
Sodium: 139 mmol/L (ref 135–145)
Total Bilirubin: 1 mg/dL (ref 0.3–1.2)
Total Protein: 4.5 g/dL — ABNORMAL LOW (ref 6.5–8.1)

## 2018-04-29 LAB — MAGNESIUM
Magnesium: 1.5 mg/dL — ABNORMAL LOW (ref 1.7–2.4)
Magnesium: 1.6 mg/dL — ABNORMAL LOW (ref 1.7–2.4)

## 2018-04-29 LAB — BASIC METABOLIC PANEL
Anion gap: 11 (ref 5–15)
BUN: 10 mg/dL (ref 8–23)
CO2: 21 mmol/L — ABNORMAL LOW (ref 22–32)
Calcium: 8 mg/dL — ABNORMAL LOW (ref 8.9–10.3)
Chloride: 109 mmol/L (ref 98–111)
Creatinine, Ser: 1.19 mg/dL — ABNORMAL HIGH (ref 0.44–1.00)
GFR calc Af Amer: 53 mL/min — ABNORMAL LOW (ref >60)
GFR calc non Af Amer: 46 mL/min — ABNORMAL LOW (ref >60)
Glucose, Bld: 157 mg/dL — ABNORMAL HIGH (ref 70–99)
Potassium: 5 mmol/L (ref 3.5–5.1)
Sodium: 141 mmol/L (ref 135–145)

## 2018-04-29 LAB — GLUCOSE, CAPILLARY
Glucose-Capillary: 101 mg/dL — ABNORMAL HIGH (ref 70–99)
Glucose-Capillary: 112 mg/dL — ABNORMAL HIGH (ref 70–99)
Glucose-Capillary: 116 mg/dL — ABNORMAL HIGH (ref 70–99)
Glucose-Capillary: 135 mg/dL — ABNORMAL HIGH (ref 70–99)
Glucose-Capillary: 97 mg/dL (ref 70–99)

## 2018-04-29 LAB — PROTIME-INR
INR: 1.47
Prothrombin Time: 17.7 seconds — ABNORMAL HIGH (ref 11.4–15.2)

## 2018-04-29 LAB — APTT: aPTT: 38 seconds — ABNORMAL HIGH (ref 24–36)

## 2018-04-29 LAB — TRIGLYCERIDES: TRIGLYCERIDES: 74 mg/dL (ref <150)

## 2018-04-29 LAB — DIC (DISSEMINATED INTRAVASCULAR COAGULATION) PANEL: D DIMER QUANT: 10.93 ug{FEU}/mL — AB (ref 0.00–0.50)

## 2018-04-29 LAB — AMYLASE: Amylase: 29 U/L (ref 28–100)

## 2018-04-29 LAB — MRSA PCR SCREENING: MRSA BY PCR: NEGATIVE

## 2018-04-29 MED ORDER — METOPROLOL TARTRATE 5 MG/5ML IV SOLN
2.0000 mg | INTRAVENOUS | Status: AC | PRN
  Administered 2018-05-02 – 2018-05-03 (×2): 5 mg via INTRAVENOUS
  Filled 2018-04-29 (×3): qty 5

## 2018-04-29 MED ORDER — SODIUM CHLORIDE 0.9% FLUSH: 10.0000 mL | INTRAVENOUS | Status: DC | PRN

## 2018-04-29 MED ORDER — POTASSIUM CHLORIDE CRYS ER 20 MEQ PO TBCR: 20.0000 meq | EXTENDED_RELEASE_TABLET | Freq: Once | ORAL | Status: DC | PRN

## 2018-04-29 MED ORDER — PANTOPRAZOLE SODIUM 40 MG PO TBEC: 40.0000 mg | DELAYED_RELEASE_TABLET | Freq: Every day | ORAL | Status: DC

## 2018-04-29 MED ORDER — IPRATROPIUM-ALBUTEROL 0.5-2.5 (3) MG/3ML IN SOLN
3.0000 mL | Freq: Four times a day (QID) | RESPIRATORY_TRACT | Status: DC
  Administered 2018-04-29 – 2018-05-10 (×44): 3 mL via RESPIRATORY_TRACT
  Filled 2018-04-29 (×43): qty 3

## 2018-04-29 MED ORDER — FENTANYL CITRATE (PF) 100 MCG/2ML IJ SOLN: 50.0000 ug | Freq: Once | INTRAMUSCULAR | Status: AC

## 2018-04-29 MED ORDER — FENTANYL 2500MCG IN NS 250ML (10MCG/ML) PREMIX INFUSION
25.0000 ug/h | INTRAVENOUS | Status: DC
  Administered 2018-04-29: 25 ug/h via INTRAVENOUS
  Filled 2018-04-29: qty 250

## 2018-04-29 MED ORDER — CEFAZOLIN SODIUM-DEXTROSE 2-4 GM/100ML-% IV SOLN
2.0000 g | Freq: Three times a day (TID) | INTRAVENOUS | Status: AC
  Administered 2018-04-29 (×2): 2 g via INTRAVENOUS
  Filled 2018-04-29 (×2): qty 100

## 2018-04-29 MED ORDER — GUAIFENESIN-DM 100-10 MG/5ML PO SYRP
15.0000 mL | ORAL_SOLUTION | ORAL | Status: DC | PRN
  Administered 2018-04-29 – 2018-05-21 (×5): 15 mL via ORAL
  Filled 2018-04-29 (×6): qty 15

## 2018-04-29 MED ORDER — MAGNESIUM SULFATE 2 GM/50ML IV SOLN: 2.0000 g | Freq: Once | INTRAVENOUS | Status: DC | PRN

## 2018-04-29 MED ORDER — CALCIUM GLUCONATE-NACL 1-0.675 GM/50ML-% IV SOLN
1.0000 g | Freq: Once | INTRAVENOUS | Status: AC
  Administered 2018-04-29: 1000 mg via INTRAVENOUS
  Filled 2018-04-29: qty 50

## 2018-04-29 MED ORDER — PHENOL 1.4 % MT LIQD
1.0000 | OROMUCOSAL | Status: DC | PRN
  Filled 2018-04-29: qty 177

## 2018-04-29 MED ORDER — DOCUSATE SODIUM 100 MG PO CAPS
100.0000 mg | ORAL_CAPSULE | Freq: Every day | ORAL | Status: DC
  Administered 2018-05-03 – 2018-05-11 (×6): 100 mg via ORAL
  Filled 2018-04-29 (×6): qty 1

## 2018-04-29 MED ORDER — ALUM & MAG HYDROXIDE-SIMETH 200-200-20 MG/5ML PO SUSP
15.0000 mL | ORAL | Status: DC | PRN
  Administered 2018-05-05 – 2018-05-23 (×4): 30 mL via ORAL
  Filled 2018-04-29 (×4): qty 30

## 2018-04-29 MED ORDER — FAMOTIDINE IN NACL 20-0.9 MG/50ML-% IV SOLN
20.0000 mg | INTRAVENOUS | Status: DC
  Administered 2018-04-29 – 2018-05-02 (×4): 20 mg via INTRAVENOUS
  Filled 2018-04-29 (×4): qty 50

## 2018-04-29 MED ORDER — HYDRALAZINE HCL 20 MG/ML IJ SOLN
5.0000 mg | INTRAMUSCULAR | Status: AC | PRN
  Administered 2018-05-01 – 2018-05-02 (×2): 5 mg via INTRAVENOUS
  Filled 2018-04-29 (×2): qty 1

## 2018-04-29 MED ORDER — ALBUTEROL SULFATE (2.5 MG/3ML) 0.083% IN NEBU
2.5000 mg | INHALATION_SOLUTION | RESPIRATORY_TRACT | Status: DC | PRN
  Administered 2018-04-30: 2.5 mg via RESPIRATORY_TRACT
  Filled 2018-04-29: qty 3

## 2018-04-29 MED ORDER — PROPOFOL 1000 MG/100ML IV EMUL
0.0000 ug/kg/min | INTRAVENOUS | Status: DC
  Administered 2018-04-29 (×2): 50 ug/kg/min via INTRAVENOUS
  Filled 2018-04-29 (×2): qty 100

## 2018-04-29 MED ORDER — FENTANYL CITRATE (PF) 100 MCG/2ML IJ SOLN
50.0000 ug | INTRAMUSCULAR | Status: DC | PRN
  Filled 2018-04-29: qty 2

## 2018-04-29 MED ORDER — BUDESONIDE 0.25 MG/2ML IN SUSP
0.2500 mg | Freq: Two times a day (BID) | RESPIRATORY_TRACT | Status: DC
  Administered 2018-04-29 – 2018-05-07 (×17): 0.25 mg via RESPIRATORY_TRACT
  Filled 2018-04-29 (×17): qty 2

## 2018-04-29 MED ORDER — CHLORHEXIDINE GLUCONATE 0.12% ORAL RINSE (MEDLINE KIT)
15.0000 mL | Freq: Two times a day (BID) | OROMUCOSAL | Status: DC
  Administered 2018-04-29 – 2018-04-30 (×4): 15 mL via OROMUCOSAL

## 2018-04-29 MED ORDER — FENTANYL BOLUS VIA INFUSION
25.0000 ug | INTRAVENOUS | Status: DC | PRN
  Filled 2018-04-29: qty 25

## 2018-04-29 MED ORDER — CHLORHEXIDINE GLUCONATE CLOTH 2 % EX PADS
6.0000 | MEDICATED_PAD | Freq: Every day | CUTANEOUS | Status: DC
  Administered 2018-04-30: 6 via TOPICAL

## 2018-04-29 MED ORDER — MORPHINE SULFATE (PF) 2 MG/ML IV SOLN
2.0000 mg | INTRAVENOUS | Status: DC | PRN
  Administered 2018-04-29 – 2018-05-02 (×17): 2 mg via INTRAVENOUS
  Filled 2018-04-29 (×13): qty 1

## 2018-04-29 MED ORDER — LABETALOL HCL 5 MG/ML IV SOLN
10.0000 mg | INTRAVENOUS | Status: AC | PRN
  Administered 2018-04-29 (×4): 10 mg via INTRAVENOUS
  Filled 2018-04-29 (×2): qty 4

## 2018-04-29 MED ORDER — ARFORMOTEROL TARTRATE 15 MCG/2ML IN NEBU
15.0000 ug | INHALATION_SOLUTION | Freq: Two times a day (BID) | RESPIRATORY_TRACT | Status: DC
  Administered 2018-04-29 – 2018-05-07 (×17): 15 ug via RESPIRATORY_TRACT
  Filled 2018-04-29 (×17): qty 2

## 2018-04-29 MED ORDER — INSULIN ASPART 100 UNIT/ML ~~LOC~~ SOLN
1.0000 [IU] | SUBCUTANEOUS | Status: DC
  Administered 2018-04-29 – 2018-04-30 (×2): 1 [IU] via SUBCUTANEOUS

## 2018-04-29 MED ORDER — SODIUM CHLORIDE 0.9 % IV SOLN: 500.0000 mL | Freq: Once | INTRAVENOUS | Status: DC | PRN

## 2018-04-29 MED ORDER — ONDANSETRON HCL 4 MG/2ML IJ SOLN
4.0000 mg | Freq: Four times a day (QID) | INTRAMUSCULAR | Status: DC | PRN
  Administered 2018-04-29 – 2018-05-24 (×9): 4 mg via INTRAVENOUS
  Filled 2018-04-29 (×9): qty 2

## 2018-04-29 MED ORDER — ALBUTEROL SULFATE (2.5 MG/3ML) 0.083% IN NEBU
INHALATION_SOLUTION | RESPIRATORY_TRACT | Status: AC
  Administered 2018-04-29: 2.5 mg
  Filled 2018-04-29: qty 3

## 2018-04-29 MED ORDER — BISACODYL 10 MG RE SUPP
10.0000 mg | Freq: Every day | RECTAL | Status: DC | PRN
  Administered 2018-05-06 – 2018-05-23 (×3): 10 mg via RECTAL
  Filled 2018-04-29 (×4): qty 1

## 2018-04-29 MED ORDER — HEPARIN SODIUM (PORCINE) 5000 UNIT/ML IJ SOLN
5000.0000 [IU] | Freq: Three times a day (TID) | INTRAMUSCULAR | Status: DC
  Administered 2018-04-29 – 2018-05-22 (×69): 5000 [IU] via SUBCUTANEOUS
  Filled 2018-04-29 (×68): qty 1

## 2018-04-29 MED ORDER — DEXTROSE-NACL 5-0.45 % IV SOLN
INTRAVENOUS | Status: DC
  Administered 2018-04-29 (×2): 75 mL/h via INTRAVENOUS
  Administered 2018-04-29 – 2018-05-01 (×5): via INTRAVENOUS

## 2018-04-29 MED ORDER — SODIUM CHLORIDE 0.9% FLUSH
10.0000 mL | Freq: Two times a day (BID) | INTRAVENOUS | Status: DC
  Administered 2018-04-30 – 2018-05-04 (×9): 10 mL

## 2018-04-29 MED ORDER — FENTANYL CITRATE (PF) 100 MCG/2ML IJ SOLN: 50.0000 ug | INTRAMUSCULAR | Status: DC | PRN

## 2018-04-29 MED ORDER — ORAL CARE MOUTH RINSE
15.0000 mL | OROMUCOSAL | Status: DC
  Administered 2018-04-29 – 2018-04-30 (×17): 15 mL via OROMUCOSAL

## 2018-04-29 MED ORDER — MORPHINE SULFATE (PF) 2 MG/ML IV SOLN
2.0000 mg | INTRAVENOUS | Status: DC | PRN
  Administered 2018-04-29 (×3): 2 mg via INTRAVENOUS
  Administered 2018-04-29 (×2): 4 mg via INTRAVENOUS
  Administered 2018-04-29: 2 mg via INTRAVENOUS
  Administered 2018-04-30 (×2): 4 mg via INTRAVENOUS
  Administered 2018-05-01 – 2018-05-02 (×5): 2 mg via INTRAVENOUS
  Filled 2018-04-29 (×8): qty 1
  Filled 2018-04-29: qty 2
  Filled 2018-04-29: qty 1
  Filled 2018-04-29: qty 2
  Filled 2018-04-29: qty 1
  Filled 2018-04-29: qty 2
  Filled 2018-04-29: qty 1
  Filled 2018-04-29 (×2): qty 2

## 2018-04-29 MED ORDER — PROPOFOL 500 MG/50ML IV EMUL
INTRAVENOUS | Status: DC | PRN
  Administered 2018-04-29: 50 ug/kg/min via INTRAVENOUS

## 2018-04-29 NOTE — Progress Notes (Signed)
175 cc Fentanyl wasted in Stericycle bin with second RN witness, [employer].

## 2018-04-29 NOTE — Progress Notes (Signed)
Subjective: Interval History: none.. Looks great.  Extubated.  Comfortable.  Family in the room with her  Objective: Vital signs in last 24 hours: Temp:  [95.2 F (35.1 C)-99.1 F (37.3 C)] 98.4 F (36.9 C) (02/01 1340) Pulse Rate:  [56-97] 83 (02/01 0940) Resp:  [16-20] 19 (02/01 1515) BP: (80-144)/(55-84) 139/71 (02/01 1515) SpO2:  [91 %-100 %] 95 % (02/01 1515) Arterial Line BP: (92-174)/(37-82) 166/77 (02/01 1515) FiO2 (%):  [40 %] 40 % (02/01 1000) Weight:  [69.9 kg] 69.9 kg (01/31 1603)  Intake/Output from previous day: 01/31 0701 - 02/01 0700 In: 15069.4 [I.V.:7407.4; OZDGU:4403; IV Piggyback:1100] Out: 4590 [Urine:390; Blood:4200] Intake/Output this shift: Total I/O In: 243.5 [I.V.:243.5] Out: 70 [Urine:70]    Lab Results: Recent Labs    04/29/18 0040  04/29/18 0409 04/29/18 0429 04/29/18 0952  WBC 9.6  --  10.2  --   --   HGB 13.6   < > 13.9 12.6 13.3  HCT 41.5   < > 40.6 37.0 39.0  PLT 42*  --  42*  42*  --   --    < > = values in this interval not displayed.   BMET Recent Labs    04/29/18 0040  04/29/18 0409 04/29/18 0429 04/29/18 0952  NA 141   < > 139 140 140  K 5.0   < > 4.0 4.0 4.1  CL 109  --  107  --   --   CO2 21*  --  22  --   --   GLUCOSE 157*  --  150*  --   --   BUN 10  --  12  --   --   CREATININE 1.19*  --  1.25*  --   --   CALCIUM 8.0*  --  8.1*  --   --    < > = values in this interval not displayed.    Studies/Results: Ct Abdomen Pelvis W Contrast  Result Date: 04/28/2018 CLINICAL DATA:  Right lower quadrant pain, nausea. EXAM: CT ABDOMEN AND PELVIS WITH CONTRAST TECHNIQUE: Multidetector CT imaging of the abdomen and pelvis was performed using the standard protocol following bolus administration of intravenous contrast. CONTRAST:  ISOVUE-300 IOPAMIDOL (ISOVUE-300) INJECTION 61% COMPARISON:  None. FINDINGS: Lower chest: Heart is normal size. Descending thoracic aorta is heavily calcified. Dependent atelectasis in the lower  lobes. No effusions. Hepatobiliary: Scattered hypodensities in the liver, likely small cysts. Gallbladder unremarkable. Pancreas: No focal abnormality or ductal dilatation. Spleen: No focal abnormality.  Normal size. Adrenals/Urinary Tract: No adrenal abnormality. No focal renal abnormality. No stones or hydronephrosis. Urinary bladder is unremarkable. Stomach/Bowel: Stomach, large and small bowel grossly unremarkable. Vascular/Lymphatic: There is an 8.3 cm infrarenal abdominal aortic aneurysm. Extensive thrombus/plaque noted. In addition, abnormal soft tissue is noted adjacent to the right side of the aneurysm and extending around the right kidney and in the right retroperitoneum compatible with large hematoma, likely related to aortic rupture. No visible active extravasation. Aneurysm in at the aortic bifurcation. Iliac vessels are heavily calcified, normal caliber. No adenopathy. Reproductive: Uterus and adnexa unremarkable.  No mass. Other: Large right retroperitoneal hematoma noted surrounding the right kidney and extending into the upper pelvis. No free fluid or free air. Bilateral inguinal hernias noted containing fat. Musculoskeletal: No acute bony abnormality. IMPRESSION: 8.3 cm infrarenal abdominal aortic aneurysm with large right retroperitoneal hematoma most compatible with aortic rupture. Critical Value/emergent results were called by telephone at the time of interpretation on 04/28/2018 at 6:40 pm  to Dr. Benjiman Core , who verbally acknowledged these results. Electronically Signed   By: Charlett Nose M.D.   On: 04/28/2018 18:40   Dg Chest Port 1 View  Result Date: 04/29/2018 CLINICAL DATA:  Followup intubated patient. EXAM: PORTABLE CHEST 1 VIEW COMPARISON:  04/29/2018 at 0052 hours FINDINGS: Right internal jugular Swan-Ganz catheter, endotracheal tube and nasal/orogastric tube are stable and well positioned. There are prominent bronchovascular markings. Additional opacity is noted in the lung  bases likely a combination of small effusions and atelectasis. This is accentuated by low lung volumes. Findings are without change from the earlier exam. No pneumothorax. IMPRESSION: 1. No significant change from the study obtained earlier today. 2. Support apparatus is well positioned. 3. Prominent bronchovascular markings and persistent lung base opacity consistent with small effusions and atelectasis. No convincing pneumonia or pulmonary edema. Electronically Signed   By: Amie Portland M.D.   On: 04/29/2018 11:56   Dg Chest Port 1 View  Result Date: 04/29/2018 CLINICAL DATA:  Postop abdominal aortic aneurysm stent. EXAM: PORTABLE CHEST 1 VIEW COMPARISON:  12/19/2014 FINDINGS: Endotracheal tube with tip measuring 4.2 cm above the carina. Enteric tube tip is off the field of view but below the left hemidiaphragm. Right Swan-Ganz catheter with tip over the pulmonary outflow tract. No pneumothorax. Shallow inspiration with linear atelectasis in the lung bases, greater on the left. Probable small left pleural effusion. Heart size and pulmonary vascularity are normal for technique. No focal consolidation in the lungs. Mediastinal contours appear intact. Calcification of the aorta. IMPRESSION: Appliances appear in satisfactory location. Shallow inspiration with atelectasis in the lung bases, greater on the left. Probable small left pleural effusion. Electronically Signed   By: Burman Nieves M.D.   On: 04/29/2018 01:16   Anti-infectives: Anti-infectives (From admission, onward)   Start     Dose/Rate Route Frequency Ordered Stop   04/29/18 0400  ceFAZolin (ANCEF) IVPB 2g/100 mL premix     2 g 200 mL/hr over 30 Minutes Intravenous Every 8 hours 04/29/18 0044 04/29/18 1259      Assessment/Plan: s/p Procedure(s): ANEURYSM ABDOMINAL AORTIC REPAIR WITH HEMASHIELD GOLD VASCULAR GRAFT 22X11 mm (N/A) Stable extubated.  Will dangle at the bedside.   LOS: 1 day   Leah Mata 04/29/2018, 3:48 PM

## 2018-04-29 NOTE — Evaluation (Addendum)
Clinical/Bedside Swallow Evaluation Patient Details  Name: CEILI BOSHERS MRN: [id number] Date of Birth: [birth year]  Today's Date: 04/29/2018 Time: SLP Start Time (ACUTE ONLY): 1430 SLP Stop Time (ACUTE ONLY): 1449 SLP Time Calculation (min) (ACUTE ONLY): 19 min  Past Medical History:  Past Medical History:  Diagnosis Date  . BACK PAIN 03/14/2009  . COPD 11/01/2006  . GLUCOSE INTOLERANCE 03/14/2009  . Headache(784.0) 03/14/2009  . HYPERTENSION 11/01/2006  . 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 INSOMNIA, HX OF 01/06/2007  . Other and unspecified hyperlipidemia 08/22/2013  . Urinary frequency 03/14/2009   Past Surgical History:  Past Surgical History:  Procedure Laterality Date  . CARDIAC CATHETERIZATION  2003   negative  . DILATION AND CURETTAGE OF UTERUS    . TONSILLECTOMY    . TUBAL LIGATION     HPI:  On 04/29/18, Patient presented to [facility] with abdominal and right flank pain.  CT scan showed large greater than 8 cm infrarenal aneurysm with contained rupture in the right retroperitoneal space.  She was directed to come immediately to [facility] to the operating room for surgery. BSE generated post-extubation; pt currently with NG tube; noted coughing with ice chips per nursing.  Assessment / Plan / Recommendation Clinical Impression   Pt with delayed cough after presentation of water via tsp; ice chips appear WFL, but after discussion with nursing staff, pt primarily NPO d/t medical condition requiring NG tube placement post-op; ST will continue to assess PO readiness as medical condition improves.  Pt/family report bronchitis prior to recent surgery which initiated coughing prior to surgery with no other dysphagia noted; continue NPO status; thank you for this consult. SLP Visit Diagnosis: Dysphagia, unspecified (R13.10)    Aspiration Risk    Mild-moderate   Diet Recommendation   NPO; ice chips allowed (primarily d/t medical issues)  Medication Administration:  Via alternative means    Other  Recommendations Oral Care Recommendations: Oral care QID   Follow up Recommendations Other (comment)(TBD)      Frequency and Duration min 2x/week  1 week       Prognosis Prognosis for Safe Diet Advancement: Good      Swallow Study   General Date of Onset: 04/29/18 HPI: patient presented to [facility] with abdominal and right flank pain.  CT scan showed large greater than 8 cm infrarenal aneurysm with contained rupture in the right retroperitoneal space.  She was directed to come immediately to [facility] to the operating room.  I have met her at the operating room and she was taken immediately to the room.  She was conscious at the time.  History was obtained from the chart. Type of Study: Bedside Swallow Evaluation Previous Swallow Assessment: (n/a) Diet Prior to this Study: NPO Temperature Spikes Noted: No Respiratory Status: Nasal cannula History of Recent Intubation: Yes Length of Intubations (days): (surgery only 04/29/18) Date extubated: (04/29/18) Behavior/Cognition: Alert;Cooperative Oral Cavity Assessment: Dry Oral Care Completed by SLP: Yes Oral Cavity - Dentition: Dentures, not available Vision: Functional for self-feeding Self-Feeding Abilities: Able to feed self;Needs assist Patient Positioning: Upright in bed Baseline Vocal Quality: Low vocal intensity Volitional Cough: Other (Comment);Weak(difficult to assess fully d/t surgery/pain level) Volitional Swallow: Able to elicit    Oral/Motor/Sensory Function Overall Oral Motor/Sensory Function: Within functional limits   Ice Chips Ice chips: Within functional limits Presentation: Spoon   Thin Liquid Thin Liquid: Impaired Presentation: Spoon Pharyngeal  Phase Impairments: Cough - Delayed Other Comments: (Pt had bronchitis prior  to surgery; on antibiotics for this)    Nectar Thick Nectar Thick Liquid: Not tested   Honey Thick Honey Thick Liquid: Not tested   Puree Puree: Not  tested   Solid     Solid: Not tested      Elvina Sidle, M.S., CCC-SLP 04/29/2018,5:20 PM

## 2018-04-29 NOTE — H&P (Signed)
The patient presented to [facility] with abdominal and right flank pain.  CT scan showed large greater than 8 cm infrarenal aneurysm with contained rupture in the right retroperitoneal space.  She was directed to come immediately to [facility] to the operating room.  I have met her at the operating room and she was taken immediately to the room.  She was conscious at the time.  History was obtained from the chart.  She is relatively healthy with allergy only to hydrochlorothiazide.  She did have palpable femoral pulses but no palpable pedal pulses.  Explained the need for emergent surgery and the patient understood.

## 2018-04-29 NOTE — Progress Notes (Signed)
Subjective: Interval History: none.. Following commands on vent.  Coughing against the ventilator.  Objective: Vital signs in last 24 hours: Temp:  [95.2 F (35.1 C)-99.1 F (37.3 C)] 99 F (37.2 C) (02/01 0800) Pulse Rate:  [56-97] 87 (02/01 0800) Resp:  [16-20] 16 (02/01 0800) BP: (80-144)/(55-75) 99/55 (02/01 0800) SpO2:  [91 %-100 %] 98 % (02/01 0800) Arterial Line BP: (92-149)/(37-82) 92/60 (02/01 0800) FiO2 (%):  [40 %] 40 % (02/01 0800) Weight:  [69.9 kg] 69.9 kg (01/31 1603)  Intake/Output from previous day: 01/31 0701 - 02/01 0700 In: 15069.4 [I.V.:7407.4; ENIDP:8242; IV Piggyback:1100] Out: 4590 [Urine:390; Blood:4200] Intake/Output this shift: Total I/O In: 88.6 [I.V.:88.6] Out: 25 [Urine:25]  Abdomen distended soft dressing dry without bleeding.  2+ dorsalis pedis pulses bilaterally  Lab Results: Recent Labs    04/29/18 0040  04/29/18 0409 04/29/18 0429  WBC 9.6  --  10.2  --   HGB 13.6   < > 13.9 12.6  HCT 41.5   < > 40.6 37.0  PLT 42*  --  42*  42*  --    < > = values in this interval not displayed.   BMET Recent Labs    04/29/18 0040  04/29/18 0409 04/29/18 0429  NA 141   < > 139 140  K 5.0   < > 4.0 4.0  CL 109  --  107  --   CO2 21*  --  22  --   GLUCOSE 157*  --  150*  --   BUN 10  --  12  --   CREATININE 1.19*  --  1.25*  --   CALCIUM 8.0*  --  8.1*  --    < > = values in this interval not displayed.    Studies/Results: Ct Abdomen Pelvis W Contrast  Result Date: 04/28/2018 CLINICAL DATA:  Right lower quadrant pain, nausea. EXAM: CT ABDOMEN AND PELVIS WITH CONTRAST TECHNIQUE: Multidetector CT imaging of the abdomen and pelvis was performed using the standard protocol following bolus administration of intravenous contrast. CONTRAST:  ISOVUE-300 IOPAMIDOL (ISOVUE-300) INJECTION 61% COMPARISON:  None. FINDINGS: Lower chest: Heart is normal size. Descending thoracic aorta is heavily calcified. Dependent atelectasis in the lower lobes.  No effusions. Hepatobiliary: Scattered hypodensities in the liver, likely small cysts. Gallbladder unremarkable. Pancreas: No focal abnormality or ductal dilatation. Spleen: No focal abnormality.  Normal size. Adrenals/Urinary Tract: No adrenal abnormality. No focal renal abnormality. No stones or hydronephrosis. Urinary bladder is unremarkable. Stomach/Bowel: Stomach, large and small bowel grossly unremarkable. Vascular/Lymphatic: There is an 8.3 cm infrarenal abdominal aortic aneurysm. Extensive thrombus/plaque noted. In addition, abnormal soft tissue is noted adjacent to the right side of the aneurysm and extending around the right kidney and in the right retroperitoneum compatible with large hematoma, likely related to aortic rupture. No visible active extravasation. Aneurysm in at the aortic bifurcation. Iliac vessels are heavily calcified, normal caliber. No adenopathy. Reproductive: Uterus and adnexa unremarkable.  No mass. Other: Large right retroperitoneal hematoma noted surrounding the right kidney and extending into the upper pelvis. No free fluid or free air. Bilateral inguinal hernias noted containing fat. Musculoskeletal: No acute bony abnormality. IMPRESSION: 8.3 cm infrarenal abdominal aortic aneurysm with large right retroperitoneal hematoma most compatible with aortic rupture. Critical Value/emergent results were called by telephone at the time of interpretation on 04/28/2018 at 6:40 pm to Dr. Benjiman Core , who verbally acknowledged these results. Electronically Signed   By: Charlett Nose M.D.   On: 04/28/2018 18:40  Dg Chest Port 1 View  Result Date: 04/29/2018 CLINICAL DATA:  Postop abdominal aortic aneurysm stent. EXAM: PORTABLE CHEST 1 VIEW COMPARISON:  12/19/2014 FINDINGS: Endotracheal tube with tip measuring 4.2 cm above the carina. Enteric tube tip is off the field of view but below the left hemidiaphragm. Right Swan-Ganz catheter with tip over the pulmonary outflow tract. No  pneumothorax. Shallow inspiration with linear atelectasis in the lung bases, greater on the left. Probable small left pleural effusion. Heart size and pulmonary vascularity are normal for technique. No focal consolidation in the lungs. Mediastinal contours appear intact. Calcification of the aorta. IMPRESSION: Appliances appear in satisfactory location. Shallow inspiration with atelectasis in the lung bases, greater on the left. Probable small left pleural effusion. Electronically Signed   By: Burman Nieves M.D.   On: 04/29/2018 01:16   Anti-infectives: Anti-infectives (From admission, onward)   Start     Dose/Rate Route Frequency Ordered Stop   04/29/18 0400  ceFAZolin (ANCEF) IVPB 2g/100 mL premix     2 g 200 mL/hr over 30 Minutes Intravenous Every 8 hours 04/29/18 0044 04/29/18 1959      Assessment/Plan: s/p Procedure(s): ANEURYSM ABDOMINAL AORTIC REPAIR WITH HEMASHIELD GOLD VASCULAR GRAFT 22X11 mm (N/A) Stable 7 hours postop from ruptured aneurysm.  Arterial blood gases and labs look excellent.  Vent plan per critical care medicine but hopefully can extubate this morning.  Updated family in the room.   LOS: 1 day   Reena Borromeo 04/29/2018, 8:32 AM

## 2018-04-29 NOTE — Consult Note (Signed)
NAME:  Leah JewelLinda D Mata, MRN:  [id number], DOB:  02/07/1946, LOS: 1 ADMISSION DATE:  04/28/2018, CONSULTATION DATE:  04/29/2018 REFERRING MD:  Dr. Arbie CookeyEarly, CHIEF COMPLAINT:  AAA  Brief History   [age] yoF presenting with acute onset of RLQ pain w/N/V and radiation to back with hypotension found to have ruptured AAA.  Taken to emergently to OR  History of present illness   HPI obtained from medical chart review as patient is intubated and sedated on mechanical ventilation.  73 year old female with history of current smoker, COPD, HTN, and HLD who presented to [facility] ER with acute onset of right sided abdominal pain with radiation to the back with associated nausea and vomiting.  Found to be pale and hypotensive in ER.  Initial Hgb 11.9.  CT abd/ pelvis showed 8.2 cm infrarenal abdominal aortic aneurysm with retroperitoneal rupture.  She was emerg[facility] and take to OR with Vascular surgery  Past Medical History  Current smoker, COPD, HTN, and HLD   Significant Hospital Events   1/31 Admit-> OR  Consults:   Procedures:  1/31 OR-> AAA repair 1/31 ETT >>  Significant Diagnostic Tests:  1/31 >> CT abd/ pelvis >> 8.3 cm infrarenal abdominal aortic aneurysm with large right retroperitoneal hematoma most compatible with aortic rupture.  Micro Data:   Antimicrobials:  1/31 Cefazolin preop  Interim history/subjective:  Normotensive, not on pressors Sedated on propofol 30 mg/kg/min  Objective   Blood pressure 124/75, pulse 65, temperature (!) 95.7 F (35.4 C), resp. rate 16, height 5\' 4"  (1.626 m), weight 69.9 kg, last menstrual period 03/29/1993, SpO2 95 %. PAP: (31)/(21) 31/21  Vent Mode: PRVC FiO2 (%):  [40 %] 40 % Set Rate:  [16 bmp] 16 bmp Vt Set:  [440 mL] 440 mL PEEP:  [5 cmH20] 5 cmH20 Plateau Pressure:  [27 cmH20] 27 cmH20   Intake/Output Summary (Last 24 hours) at 04/29/2018 0048 Last data filed at 04/29/2018 0001 Gross per 24 hour  Intake [id number] ml  Output  4405 ml  Net 10157 ml   Filed Weights   04/28/18 1603  Weight: 69.9 kg   Examination: General:  Critically ill elderly female sedate on MV  HEENT: MM pink/moist, ETT at 22, R nare NGT, pupils 2/reactive, anicteric  Neuro: sedated CV: SR 61, rrr, no m/r/g, R cordis w/PA catheter, faint palpable PT pulses, mostly dopplered PULM: even/non-labored on MV, lungs bilaterally clear diffuse exp wheeze GI: round/ distended, midline abd incision dry Extremities: warm/dry, no edema, toes dusky Skin: no rashes   Resolved Hospital Problem list    Assessment & Plan:  Ruptured infrarenal AAA s/p open repair with EBL ~4L P:  Per Vascular surgery SBP goal < 160 Ongoing neurovascular checks  Ongoing monitor of abd girths  Ongoing PA catheter for now per vascular Prn apresoline and labetalol   ABLA w/EBL 4.2L -s/p 10 units PRBC, 6 FFP, cell saver, albumin x 4, and LR x 7L P:  Hgb stable post-op, trending  Pending DIC panel/ coags Transfuse per ICU protocol  Trend iCa   Acute respiratory insufficiency in the post-operative setting P:  PRVC 8 cc/kg, rate Wean FiO2/ PEEP for goal sat 88-94% CXR - reviewed stable ETT/lines Trend ABG VAP bundle PAD protocol with fentanyl/ propofol, RASS goal -1/-2 High risk for AKI P:  Oliguria during surgery D5/0.45 NS at 75 ml/hr Pending renal panel/ mag Strict I/O's, UOP, daily Wts  COPD/ ongoing tobacco abuse P:  Brovanna/ pulmicort, duonebs TID,  albuterol prn   Hyperglycemia P:  CBG q 4 SSI sensitive   Hx HTN P:  Hold home Norvasc, lisinopril, pravastatin  Best practice:  Diet: NPO Pain/Anxiety/Delirium protocol (if indicated): propofol/ fentanyl  VAP protocol (if indicated): yes DVT prophylaxis: heparin SQ GI prophylaxis: protonix Glucose control: SSI Mobility: BR Code Status: Full  Family Communication:  Disposition: ICU  Labs   CBC: Recent Labs  Lab 04/28/18 1649  WBC 11.1*  NEUTROABS 9.1*  HGB 11.7*  HCT 37.0  MCV  95.9  PLT 260    Basic Metabolic Panel: Recent Labs  Lab 04/28/18 1649  NA 133*  K 4.2  CL 102  CO2 20*  GLUCOSE 180*  BUN 13  CREATININE 1.00  CALCIUM 9.3   GFR: Estimated Creatinine Clearance: 49.5 mL/min (by C-G formula based on SCr of 1 mg/dL). Recent Labs  Lab 04/28/18 1649  WBC 11.1*    Liver Function Tests: Recent Labs  Lab 04/28/18 1649  AST 20  ALT 16  ALKPHOS 65  BILITOT 0.6  PROT 6.7  ALBUMIN 3.6   No results for input(s): LIPASE, AMYLASE in the last 168 hours. No results for input(s): AMMONIA in the last 168 hours.  ABG No results found for: PHART, PCO2ART, PO2ART, HCO3, TCO2, ACIDBASEDEF, O2SAT   Coagulation Profile: Recent Labs  Lab 04/28/18 1842  INR 1.00    Cardiac Enzymes: No results for input(s): CKTOTAL, CKMB, CKMBINDEX, TROPONINI in the last 168 hours.  HbA1C: Hemoglobin A1C  Date/Time Value Ref Range Status  03/12/2017 11:47 AM 5.8  Final   Hgb A1c MFr Bld  Date/Time Value Ref Range Status  09/07/2016 10:09 AM 6.5 4.6 - 6.5 % Final    Comment:    Glycemic Control Guidelines for People with Diabetes:Non Diabetic:  <6%Goal of Therapy: <7%Additional Action Suggested:  >8%   03/03/2016 08:56 AM 6.2 4.6 - 6.5 % Final    Comment:    Glycemic Control Guidelines for People with Diabetes:Non Diabetic:  <6%Goal of Therapy: <7%Additional Action Suggested:  >8%     CBG: No results for input(s): GLUCAP in the last 168 hours.  Review of Systems:   Unable  Past Medical History  She,  has a past medical history of BACK PAIN (03/14/2009), COPD (11/01/2006), GLUCOSE INTOLERANCE (03/14/2009), Headache(784.0) (03/14/2009), HYPERTENSION (11/01/2006), Hypertrophy of tongue papillae (06/05/2009), INSOMNIA, HX OF (01/06/2007), Other and unspecified hyperlipidemia (08/22/2013), and Urinary frequency (03/14/2009).   Surgical History    Past Surgical History:  Procedure Laterality Date  . CARDIAC CATHETERIZATION  2003   negative  . DILATION AND  CURETTAGE OF UTERUS    . TONSILLECTOMY    . TUBAL LIGATION       Social History   reports that she has been smoking. She has never used smokeless tobacco. She reports that she does not drink alcohol or use drugs.   Family History   Her family history includes Congestive Heart Failure in her mother; Coronary artery disease in an other family member; Diabetes in her father and mother; Heart attack in her father; Hypertension in her mother. There is no history of Colon cancer or Breast cancer.   Allergies Allergies  Allergen Reactions  . Hydrochlorothiazide      Home Medications  Prior to Admission medications   Medication Sig Start Date End Date Taking? Authorizing Provider  albuterol (VENTOLIN HFA) 108 (90 Base) MCG/ACT inhaler Inhale 2 puffs into the lungs every 6 (six) hours as needed for wheezing or shortness of breath. 03/12/17  Yes Corwin Levins, MD  amLODipine (NORVASC) 5 MG tablet Take 1 tablet (5 mg total) by mouth daily. 04/06/18  Yes Corwin Levins, MD  fluticasone [facility]) 50 MCG/ACT nasal spray Place 2 sprays into both nostrils daily. 03/12/17 04/28/18 Yes Corwin Levins, MD  lisinopril (PRINIVIL,ZESTRIL) 20 MG tablet Take 1 tablet (20 mg total) by mouth daily. 04/06/18  Yes Corwin Levins, MD  pravastatin (PRAVACHOL) 20 MG tablet Take 1 tablet (20 mg total) by mouth daily. 04/06/18  Yes Corwin Levins, MD  doxycycline (VIBRA-TABS) 100 MG tablet Take 1 tablet (100 mg total) by mouth 2 (two) times daily. Patient not taking: Reported on 04/28/2018 04/06/18   Corwin Levins, MD  predniSONE (DELTASONE) 10 MG tablet 3 tabs by mouth per day for 3 days,2tabs per day for 3 days,1tab per day for 3 days Patient not taking: Reported on 04/28/2018 04/06/18   Corwin Levins, MD  silver sulfADIAZINE (SILVADENE) 1 % cream Apply 1 application topically daily. Patient not taking: Reported on 04/28/2018 11/17/17   Myrlene Broker, MD  temazepam (RESTORIL) 15 MG capsule TAKE  1 CAPSULES BY MOUTH AT BEDTIME  AS NEEDED Patient not taking: Reported on 04/28/2018 03/12/17   Corwin Levins, MD     Critical care time: 45 mins    Posey Boyer, MSN, AGACNP-BC [facility] Pgr: [phone] or if no answer [phone] 04/29/2018, 1:47 AM

## 2018-04-29 NOTE — Progress Notes (Signed)
PT Cancellation Note  Patient Details Name: Leah Mata MRN: [id number] DOB: 1946-02-07   Cancelled Treatment:    Reason Eval/Treat Not Completed: Patient not medically ready. Per discussion with RN, pt weaning from ventilator.  Laurina Bustle, PT, DPT Acute Rehabilitation Services Pager [phone] Office [phone]    Vanetta Mulders 04/29/2018, 11:09 AM

## 2018-04-29 NOTE — Op Note (Signed)
    OPERATIVE REPORT  DATE OF SURGERY: 04/29/2018  PATIENT: Leah Mata, 73 y.o. female MRN: [id number]  DOB: 1946-01-19  PRE-OPERATIVE DIAGNOSIS: Ruptured infrarenal abdominal aortic aneurysm  POST-OPERATIVE DIAGNOSIS:  Same  PROCEDURE: Open repair of ruptured infrarenal abdominal aortic aneurysm  SURGEON:  Gretta Began, M.D.  PHYSICIAN ASSISTANT: Nurse  ANESTHESIA: General  EBL: per anesthesia record  Total I/O In: 03500 [I.V.:7000; XFGHW:2993; IV Piggyback:1000] Out: 4405 [Urine:205; Blood:4200]  BLOOD ADMINISTERED: none  DRAINS: none  SPECIMEN: none  COUNTS CORRECT:  YES  PATIENT DISPOSITION:  PACU - hemodynamically stable  PROCEDURE DETAILS: The patient presented to [facility] with abdominal and right flank pain.  CT scan showed 8.2 cm infrarenal abdominal aortic aneurysm with retroperitoneal rupture.  She had extreme angulation of her infrarenal aorta and involvement at the level of her left renal artery.  She did have calcification and mild ectasia of her iliac arteries but no aneurysmal change in the iliac arteries were patent.  The patient was brought immediately to [facility] and taken immediately to surgery.  Arterial line and central line Foley were all placed prior to induction of anesthesia.  The abdomen was in both groins were prepped and draped and the patient was then intubated.  Incision was made from the level of the xiphoid to the pubis and carried down through the midline fascia with electrocautery.  The intraperitoneal contents were explored.  The patient had a large retroperitoneal hematoma but no intraperitoneal blood.  Dissection was continued at the level of the renal arteries.  The patient did have extreme angulation at this level.  The artery was controlled bluntly prior to entering the hematoma.  Patient was given 6000 intravenous heparin and the aorta was occluded below the level of the renal arteries.  The retroperitoneal hematoma  was entered and the iliac arteries were occluded bilaterally with Henley clamps.  Patient had a large rupture on the right lateral wall of her aorta.  The aorta was transected on the anterior three fourths below the level of the renal arteries.  A 22 x 11 Hemashield graft was brought into the field.  The main body was cut to allow approximately 3 cm of main body.  The graft was sewn into into the aorta with a running 3-0 Prolene suture.  Anastomosis was tested and additional sutures were required for hemostasis.  The right and left limbs of the graft were then brought to the pelvis.  The iliac arteries were transected bilaterally and the grafts were sewn end-to-end to the common iliac arteries bilaterally.  Prior to completion of each anastomosis the usual flushing maneuvers were undertaken.  There was good backbleeding from the iliac vessels.  The anastomosis were completed.  Patient was given 50 mg of protamine to reverse the heparin.  The hemostasis was obtained with electrocautery.  The aortic aneurysmal sac was closed over the graft with a running 2-0 Vicryl suture.  Next the retroperitoneal tissues were closed with a running 2-0 Vicryl suture.  The small bowel was run in its entirety and found to be without injury.  This was placed in the abdomen.  The midline fascia was closed with #1 PDS suture beginning proximally and distally and tying in the middle.  It was closed with skin staples.  The patient had palpable femoral pulses and biphasic posterior tibial pulses and transferred to the surgical intensive care unit   Larina Earthly, M.D., [facility] 04/29/2018 12:59 AM

## 2018-04-29 NOTE — Transfer of Care (Signed)
Immediate Anesthesia Transfer of Care Note  Patient: Leah Mata  Procedure(s) Performed: ANEURYSM ABDOMINAL AORTIC REPAIR WITH HEMASHIELD GOLD VASCULAR GRAFT 22X11 mm (N/A )  Patient Location: ICU  Anesthesia Type:General  Level of Consciousness: sedated, unresponsive and Patient remains intubated per anesthesia plan  Airway & Oxygen Therapy: Patient remains intubated per anesthesia plan and Patient placed on Ventilator (see vital sign flow sheet for setting)  Post-op Assessment: Report given to RN and Post -op Vital signs reviewed and stable  Post vital signs: Reviewed and stable  Last Vitals:  Vitals Value Taken Time  BP    Temp 35.4 C 04/29/2018 12:42 AM  Pulse 65 04/29/2018 12:38 AM  Resp 16 04/29/2018 12:42 AM  SpO2 95 % 04/29/2018 12:42 AM  Vitals shown include unvalidated device data.  Last Pain:  Vitals:   04/28/18 1851  TempSrc:   PainSc: 7          Complications: No apparent anesthesia complications

## 2018-04-29 NOTE — Progress Notes (Signed)
NAME:  Leah Mata, MRN:  [id number], DOB:  08/12/1945, LOS: 1 ADMISSION DATE:  04/28/2018, CONSULTATION DATE:  04/29/2018 REFERRING MD:  Dr. Arbie CookeyEarly, CHIEF COMPLAINT:  AAA  Brief History   [age] yoF presenting with acute onset of RLQ pain w/N/V and radiation to back with hypotension found to have ruptured AAA.  Taken to emergently to OR  History of present illness   HPI obtained from medical chart review as patient is intubated and sedated on mechanical ventilation.  73 year old female with history of current smoker, COPD, HTN, and HLD who presented to [facility] with acute onset of right sided abdominal pain with radiation to the back with associated nausea and vomiting.  Found to be pale and hypotensive in ER.  Initial Hgb 11.9.  CT abd/ pelvis showed 8.2 cm infrarenal abdominal aortic aneurysm with retroperitoneal rupture.  [facility] and take to OR with Vascular surgery  Past Medical History  Current smoker, COPD, HTN, and HLD   Significant Hospital Events   1/31 Admit-> OR  Consults:   Procedures:  1/31 OR-> AAA repair 1/31 ETT >>  Significant Diagnostic Tests:  1/31 >> CT abd/ pelvis >> 8.3 cm infrarenal abdominal aortic aneurysm with large right retroperitoneal hematoma most compatible with aortic rupture.  Micro Data:   Antimicrobials:  1/31 Cefazolin preop  Interim history/subjective:  Tolerating PSV 5 well   Objective   Blood pressure (!) 112/59, pulse 83, temperature 98.6 F (37 C), temperature source Core, resp. rate 17, height 5\' 4"  (1.626 m), weight 69.9 kg, last menstrual period 03/29/1993, SpO2 96 %. PAP: (31-42)/(20-32) 39/27 CVP:  [14 mmHg-18 mmHg] 18 mmHg  Vent Mode: PSV;CPAP FiO2 (%):  [40 %] 40 % Set Rate:  [16 bmp] 16 bmp Vt Set:  [440 mL] 440 mL PEEP:  [5 cmH20] 5 cmH20 Pressure Support:  [5 cmH20] 5 cmH20 Plateau Pressure:  [22 cmH20-27 cmH20] 22 cmH20   Intake/Output Summary (Last 24 hours) at 04/29/2018 1123 Last  data filed at 04/29/2018 1000 Gross per 24 hour  Intake 15312.93 ml  Output 4660 ml  Net 10652.93 ml   Filed Weights   04/28/18 1603  Weight: 69.9 kg   Examination: General: Obese ill-appearing woman, mechanically ventilated HEENT: ET tube in position, NG tube in position, pupils reactive Neuro: Awake, interacting and following commands, moderate cough strength CV: Regular, no murmur, PA catheter in place PULM: Decreased posterior laterally but no crackles, no wheezes GI: Somewhat protuberant, soft, hypoactive bowel sounds Extremities: Lower extremities cool, no cyanosis Skin: No rash  Resolved Hospital Problem list    Assessment & Plan:  Ruptured infrarenal AAA s/p open repair with EBL ~4L P:  SBP goal less than 160, hydralazine and labetalol ordered Ongoing neurovascular checks Following serial CBC Question whether PA catheter may be able to come out today  ABLA w/EBL 4.2L -s/p 10 units PRBC, 6 FFP, cell saver, albumin x 4, and LR x 7L P:  Following CBC Fibrinogen low at 177, INR 1.3 Consider FFP if fibrinogen remains low Follow ionized calcium, slightly low this morning, supplement  Acute respiratory insufficiency in the post-operative setting P:  PRVC 8 cc/kg Tolerating pressure support ventilation. At high risk for TRALI given massive transfusions.  Her last chest x-ray was at midnight.  We will check another now to compare, ensure no evolving interstitial infiltrates that would preclude an extubation.  If stable then I think it is reasonable to wean to extubate given  her good performance on pressure support this morning. PAD protocol for sedation VAP prevention orders  Acute renal insufficiency, presumed ATN due to hypoperfusion P:  Follow renal function with volume resuscitation Follow urine output  COPD/ ongoing tobacco abuse P:  Bronchodilators as ordered  Hyperglycemia P:  Sliding-scale insulin protocol  Hx HTN P:  Blood pressure control as above,  holding home amlodipine, lisinopril  Best practice:  Diet: NPO Pain/Anxiety/Delirium protocol (if indicated): propofol/ fentanyl  VAP protocol (if indicated): yes DVT prophylaxis: heparin SQ GI prophylaxis: protonix Glucose control: SSI  Mobility: BR Code Status: Full  Family Communication:  Disposition: ICU  Labs   CBC: Recent Labs  Lab 04/28/18 1649  04/29/18 0040 04/29/18 0048 04/29/18 0409 04/29/18 0429 04/29/18 0952  WBC 11.1*  --  9.6  --  10.2  --   --   NEUTROABS 9.1*  --   --   --   --   --   --   HGB 11.7*   < > 13.6 12.9 13.9 12.6 13.3  HCT 37.0   < > 41.5 38.0 40.6 37.0 39.0  MCV 95.9  --  89.1  --  87.7  --   --   PLT 260  --  42*  --  42*  42*  --   --    < > = values in this interval not displayed.    Basic Metabolic Panel: Recent Labs  Lab 04/28/18 1649  04/29/18 0040 04/29/18 0048 04/29/18 0409 04/29/18 0429 04/29/18 0952  NA 133*   < > 141 140 139 140 140  K 4.2   < > 5.0 5.1 4.0 4.0 4.1  CL 102  --  109  --  107  --   --   CO2 20*  --  21*  --  22  --   --   GLUCOSE 180*  --  157*  --  150*  --   --   BUN 13  --  10  --  12  --   --   CREATININE 1.00  --  1.19*  --  1.25*  --   --   CALCIUM 9.3  --  8.0*  --  8.1*  --   --   MG  --   --  1.5*  --  1.6*  --   --    < > = values in this interval not displayed.   GFR: Estimated Creatinine Clearance: 39.6 mL/min (A) (by C-G formula based on SCr of 1.25 mg/dL (H)). Recent Labs  Lab 04/28/18 1649 04/29/18 0040 04/29/18 0409  WBC 11.1* 9.6 10.2    Liver Function Tests: Recent Labs  Lab 04/28/18 1649 04/29/18 0409  AST 20 97*  ALT 16 63*  ALKPHOS 65 33*  BILITOT 0.6 1.0  PROT 6.7 4.5*  ALBUMIN 3.6 3.0*   Recent Labs  Lab 04/29/18 0409  AMYLASE 29   No results for input(s): AMMONIA in the last 168 hours.  ABG    Component Value Date/Time   PHART 7.344 (L) 04/29/2018 0952   PCO2ART 46.3 04/29/2018 0952   PO2ART 78.0 (L) 04/29/2018 0952   HCO3 25.2 04/29/2018 0952    TCO2 27 04/29/2018 0952   ACIDBASEDEF 1.0 04/29/2018 0952   O2SAT 95.0 04/29/2018 0952     Coagulation Profile: Recent Labs  Lab 04/28/18 1842 04/29/18 0040 04/29/18 0409  INR 1.00 1.47 1.33    Cardiac Enzymes: No results for input(s): CKTOTAL, CKMB, CKMBINDEX, TROPONINI in  the last 168 hours.  HbA1C: Hemoglobin A1C  Date/Time Value Ref Range Status  03/12/2017 11:47 AM 5.8  Final   Hgb A1c MFr Bld  Date/Time Value Ref Range Status  09/07/2016 10:09 AM 6.5 4.6 - 6.5 % Final    Comment:    Glycemic Control Guidelines for People with Diabetes:Non Diabetic:  <6%Goal of Therapy: <7%Additional Action Suggested:  >8%   03/03/2016 08:56 AM 6.2 4.6 - 6.5 % Final    Comment:    Glycemic Control Guidelines for People with Diabetes:Non Diabetic:  <6%Goal of Therapy: <7%Additional Action Suggested:  >8%     CBG: Recent Labs  Lab 04/29/18 0052 04/29/18 0903  GLUCAP 135* 101*   Critical care time: 35 mins     Levy Pupa, MD, PhD 04/29/2018, 11:33 AM  Pulmonary and Critical Care [phone] or if no answer [phone]

## 2018-04-29 NOTE — Progress Notes (Signed)
eLink Physician-Brief Progress Note Patient Name: Leah Mata DOB: 01-09-1946 MRN: [id number]   Date of Service  04/29/2018  HPI/Events of Note  73 y/o F smoker, HTN and dyslipidemia presented with right sided abdominal pain found to have a >8 cm rupture infrarenal AAA underwent emergent repair. EBL >4L received 10 PRBC, 6 FFP, 4 albumin, 7L LR and cellsaver 1460.  Patient remains intubated and sedated. No pressors, urine output adequate.  eICU Interventions  BP control, cautious hydration and watch out for signs of bleed and TRALI     Intervention Category Major Interventions: Respiratory failure - evaluation and management Intermediate Interventions: Bleeding - evaluation and treatment with blood products Evaluation Type: New Patient Evaluation  [name] Keeshawn Fakhouri 04/29/2018, 4:09 AM

## 2018-04-30 ENCOUNTER — Inpatient Hospital Stay (HOSPITAL_COMMUNITY): Payer: Medicare PPO

## 2018-04-30 ENCOUNTER — Other Ambulatory Visit: Payer: Self-pay

## 2018-04-30 DIAGNOSIS — I713 Abdominal aortic aneurysm, ruptured: Principal | ICD-10-CM

## 2018-04-30 LAB — CBC
HCT: 41.3 % (ref 36.0–46.0)
Hemoglobin: 13.5 g/dL (ref 12.0–15.0)
MCH: 29.5 pg (ref 26.0–34.0)
MCHC: 32.7 g/dL (ref 30.0–36.0)
MCV: 90.2 fL (ref 80.0–100.0)
Platelets: 50 10*3/uL — ABNORMAL LOW (ref 150–400)
RBC: 4.58 MIL/uL (ref 3.87–5.11)
RDW: 15.8 % — AB (ref 11.5–15.5)
WBC: 11.2 10*3/uL — ABNORMAL HIGH (ref 4.0–10.5)
nRBC: 0 % (ref 0.0–0.2)

## 2018-04-30 LAB — BASIC METABOLIC PANEL
Anion gap: 8 (ref 5–15)
BUN: 13 mg/dL (ref 8–23)
CALCIUM: 8.3 mg/dL — AB (ref 8.9–10.3)
CO2: 25 mmol/L (ref 22–32)
Chloride: 106 mmol/L (ref 98–111)
Creatinine, Ser: 0.96 mg/dL (ref 0.44–1.00)
GFR calc Af Amer: >60 mL/min (ref >60)
GFR calc non Af Amer: 60 mL/min — ABNORMAL LOW (ref >60)
Glucose, Bld: 122 mg/dL — ABNORMAL HIGH (ref 70–99)
Potassium: 3.9 mmol/L (ref 3.5–5.1)
Sodium: 139 mmol/L (ref 135–145)

## 2018-04-30 LAB — PROTIME-INR
INR: 1.16
PROTHROMBIN TIME: 14.7 s (ref 11.4–15.2)

## 2018-04-30 LAB — GLUCOSE, CAPILLARY
Glucose-Capillary: 102 mg/dL — ABNORMAL HIGH (ref 70–99)
Glucose-Capillary: 103 mg/dL — ABNORMAL HIGH (ref 70–99)
Glucose-Capillary: 109 mg/dL — ABNORMAL HIGH (ref 70–99)
Glucose-Capillary: 110 mg/dL — ABNORMAL HIGH (ref 70–99)
Glucose-Capillary: 122 mg/dL — ABNORMAL HIGH (ref 70–99)
Glucose-Capillary: 137 mg/dL — ABNORMAL HIGH (ref 70–99)
Glucose-Capillary: 98 mg/dL (ref 70–99)

## 2018-04-30 LAB — MAGNESIUM: Magnesium: 1.7 mg/dL (ref 1.7–2.4)

## 2018-04-30 LAB — FIBRINOGEN: Fibrinogen: 335 mg/dL (ref 210–475)

## 2018-04-30 MED ORDER — ORAL CARE MOUTH RINSE
15.0000 mL | Freq: Two times a day (BID) | OROMUCOSAL | Status: DC
  Administered 2018-05-01 – 2018-05-11 (×9): 15 mL via OROMUCOSAL

## 2018-04-30 MED ORDER — FUROSEMIDE 10 MG/ML IJ SOLN
20.0000 mg | Freq: Once | INTRAMUSCULAR | Status: AC
  Administered 2018-04-30: 20 mg via INTRAVENOUS
  Filled 2018-04-30: qty 2

## 2018-04-30 MED ORDER — CHLORHEXIDINE GLUCONATE 0.12 % MT SOLN
15.0000 mL | Freq: Two times a day (BID) | OROMUCOSAL | Status: DC
  Administered 2018-04-30 – 2018-05-24 (×24): 15 mL via OROMUCOSAL
  Filled 2018-04-30 (×37): qty 15

## 2018-04-30 NOTE — Progress Notes (Signed)
Celenia, RN aware of order to d/c central line and will address.

## 2018-04-30 NOTE — Anesthesia Postprocedure Evaluation (Signed)
Anesthesia Post Note  Patient: Leah Mata  Procedure(s) Performed: ANEURYSM ABDOMINAL AORTIC REPAIR WITH HEMASHIELD GOLD VASCULAR GRAFT 22X11 mm (N/A )     Patient location during evaluation: SICU Anesthesia Type: General Level of consciousness: sedated Pain management: pain level controlled Vital Signs Assessment: post-procedure vital signs reviewed and stable Respiratory status: patient remains intubated per anesthesia plan Cardiovascular status: stable Postop Assessment: no apparent nausea or vomiting Anesthetic complications: no    Last Vitals:  Vitals:   04/30/18 1300 04/30/18 1330  BP: 133/69 127/67  Pulse:    Resp: (!) 21 20  Temp:    SpO2: 94% 94%    Last Pain:  Vitals:   04/30/18 1241  TempSrc:   PainSc: Asleep                 Loranda Mastel S

## 2018-04-30 NOTE — Evaluation (Signed)
Physical Therapy Evaluation Patient Details Name: Leah Mata MRN: [id number] DOB: November 29, 1945 Today's Date: 04/30/2018   History of Present Illness  Pt is a 73 y.o. F with significant PMH of COPD who presents with abdominal and right flank pain. 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 S/p aneurysm abdominal aortic repair.  Clinical Impression  Pt admitted with above diagnosis. Pt currently with functional limitations due to the deficits listed below (see PT Problem List). Prior to admission, pt independent with mobility and ADL's. On PT evaluation, pt presenting with decreased functional mobility secondary to pain, decreased balance, and activity tolerance. Requiring up to moderate assistance for all functional mobility. Able to transfer from bed to chair today with cues for breathing technique. SpO2 84-91% on 4L O2, HR peak at 126 bpm. Education provided for IS use. Suspect pt will progress fairly quickly given PLOF and pain management. Pt will benefit from skilled PT to increase their independence and safety with mobility to allow discharge to the venue listed below.       Follow Up Recommendations Home health PT;Supervision for mobility/OOB    Equipment Recommendations  Rolling walker with 5" wheels;3in1 (PT)    Recommendations for Other Services       Precautions / Restrictions Precautions Precautions: Fall Restrictions Weight Bearing Restrictions: No      Mobility  Bed Mobility Overal bed mobility: Needs Assistance Bed Mobility: Rolling;Sidelying to Sit Rolling: Mod assist Sidelying to sit: Mod assist       General bed mobility comments: Mod assist for rolling onto right and sitting up to edge of bed. Limited by pain  Transfers Overall transfer level: Needs assistance Equipment used: 2 person hand held assist Transfers: Sit to/from [employer] Sit to Stand: Mod assist;+2  safety/equipment Stand pivot transfers: Min assist;+2 safety/equipment       General transfer comment: ModA to stand from edge of bed with mild posterior lean. Able to correct with cues for anterior weight shift. MinA + 2 via handheld assist from bed to chair  Ambulation/Gait                Stairs            Wheelchair Mobility    Modified Rankin (Stroke Patients Only)       Balance Overall balance assessment: Needs assistance   Sitting balance-[name]: Good     Standing balance support: Bilateral upper extremity supported Standing balance-[name]: Poor Standing balance comment: posterior lean                             Pertinent Vitals/Pain Pain Assessment: Faces Faces Pain Scale: Hurts even more Pain Location: abdominal incision Pain Descriptors / Indicators: Grimacing;Guarding Pain Intervention(s): Monitored during session;Limited activity within patient's tolerance;Repositioned    Home Living Family/patient expects to be discharged to:: Private residence Living Arrangements: Children(son) Available Help at Discharge: Family;Available PRN/intermittently Type of Home: House Home Access: Stairs to enter Entrance Stairs-Rails: Doctor, general practice of Steps: 5 Home Layout: One level Home Equipment: None      Prior Function Level of Independence: Independent         Comments: enjoys watching movies and collecting coupons     Hand Dominance        Extremity/Trunk Assessment   Upper Extremity Assessment Upper Extremity Assessment: Defer to OT evaluation    Lower Extremity Assessment Lower Extremity  Assessment: Overall WFL for tasks assessed    Cervical / Trunk Assessment Cervical / Trunk Assessment: Normal  Communication   Communication: No difficulties  Cognition Arousal/Alertness: Awake/alert Behavior During Therapy: Flat affect Overall Cognitive Status: Within Functional Limits for tasks  assessed                                        General Comments      Exercises     Assessment/Plan    PT Assessment Patient needs continued PT services  PT Problem List Decreased activity tolerance;Decreased balance;Decreased mobility;Pain       PT Treatment Interventions DME instruction;Gait training;Stair training;Functional mobility training;Therapeutic activities;Therapeutic exercise;Balance training;Patient/family education    PT Goals (Current goals can be found in the Care Plan section)  Acute Rehab PT Goals Patient Stated Goal: none stated; agreeable to PT evaluation PT Goal Formulation: With patient Time For Goal Achievement: 05/14/18 Potential to Achieve Goals: Good    Frequency Min 3X/week   Barriers to discharge        Co-evaluation PT/OT/SLP Co-Evaluation/Treatment: Yes Reason for Co-Treatment: To address functional/ADL transfers PT goals addressed during session: Mobility/safety with mobility         AM-PAC PT "6 Clicks" Mobility  Outcome Measure Help needed turning from your back to your side while in a flat bed without using bedrails?: A Lot Help needed moving from lying on your back to sitting on the side of a flat bed without using bedrails?: A Lot Help needed moving to and from a bed to a chair (including a wheelchair)?: A Little Help needed standing up from a chair using your arms (e.g., wheelchair or bedside chair)?: A Lot Help needed to walk in hospital room?: A Little Help needed climbing 3-5 steps with a railing? : A Lot 6 Click Score: 14    End of Session Equipment Utilized During Treatment: Oxygen Activity Tolerance: Patient tolerated treatment well Patient left: in chair;with call bell/phone within reach;with chair alarm set Nurse Communication: Mobility status PT Visit Diagnosis: Unsteadiness on feet (R26.81);Pain;Difficulty in walking, not elsewhere classified (R26.2) Pain - part of body: (abdomen)    Time:  [id number] PT Time Calculation (min) (ACUTE ONLY): 26 min   Charges:   PT Evaluation $PT Eval Moderate Complexity: 1 Mod        Laurina Bustlearoline Marlissa Emerick, [place], DPT Acute Rehabilitation Services Pager [phone] Office [phone]   Vanetta MuldersCarloine H Falesha Schommer 04/30/2018, 11:22 AM

## 2018-04-30 NOTE — Evaluation (Signed)
Occupational Therapy Evaluation Patient Details Name: Leah Mata MRN: [id number] DOB: 01-03-1946 Today's Date: 04/30/2018    History of Present Illness Pt is a 73 y.o. F with significant PMH of COPD who presents with abdominal and right flank pain. 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 S/p aneurysm abdominal aortic repair.   Clinical Impression   PTA, pt was living with her son and was independent. Pt currently requiring Min A for UB ADLs, Max A for LB ADLs, and Mod A +2 for functional transfers. Pt presenting with decreased balance, strength, and activity tolerance with increased pain and decreased SpO2 on 4L. Pt SpO2 dropping to 84% on 4L during activity; benefited from rest breaks and cues for purse lip breathing. Pt would benefit from further acute OT to facilitate safe dc. Recommend dc to home with HHOT for further OT to optimize safety, independence with ADLs, and return to PLOF.   BP stable. Peak HR 126 bpm. SpO2 ranging 92-84% on 4L O2.     Follow Up Recommendations  Home health OT;Supervision/Assistance - 24 hour    Equipment Recommendations  Tub/shower seat    Recommendations for Other Services PT consult     Precautions / Restrictions Precautions Precautions: Fall Restrictions Weight Bearing Restrictions: No      Mobility Bed Mobility Overal bed mobility: Needs Assistance Bed Mobility: Rolling;Sidelying to Sit Rolling: Mod assist Sidelying to sit: Mod assist       General bed mobility comments: Mod assist for rolling onto right and sitting up to edge of bed. Limited by pain  Transfers Overall transfer level: Needs assistance Equipment used: 2 person hand held assist Transfers: Sit to/from [employer] Sit to Stand: Mod assist;+2 safety/equipment Stand pivot transfers: Min assist;+2 safety/equipment       General transfer comment: ModA to stand from edge of bed with mild  posterior lean. Able to correct with cues for anterior weight shift. MinA + 2 via handheld assist from bed to chair    Balance Overall balance assessment: Needs assistance Sitting-balance support: No upper extremity supported;Feet supported Sitting balance-[name]: Fair     Standing balance support: Bilateral upper extremity supported Standing balance-[name]: Poor Standing balance comment: posterior lean                           ADL either performed or assessed with clinical judgement   ADL Overall ADL's : Needs assistance/impaired Eating/Feeding: NPO   Grooming: Supervision/safety;Set up;Sitting   Upper Body Bathing: Minimal assistance;Sitting   Lower Body Bathing: Maximal assistance;Sit to/from stand   Upper Body Dressing : Minimal assistance;Sitting Upper Body Dressing Details (indicate cue type and reason): Donned second gown like a jacket Lower Body Dressing: Maximal assistance;Sit to/from stand Lower Body Dressing Details (indicate cue type and reason): Unable to bring ankles to knees due to pain. Max A to don socks while seated at [employer] Transfer: Moderate assistance;Minimal assistance;+2 for physical assistance;Stand-pivot(Simulated to recliner)           Functional mobility during ADLs: Moderate assistance;Minimal assistance;+2 for physical assistance General ADL Comments: Pt limited by significant pain at abdomen. Highly motivated to participate in therapy.      Vision         Perception     Praxis      Pertinent Vitals/Pain Pain Assessment: Faces Faces Pain Scale: Hurts even more Pain Location: abdominal incision Pain Descriptors /  Indicators: Grimacing;Guarding Pain Intervention(s): Monitored during session;Limited activity within patient's tolerance;Repositioned     Hand Dominance Right   Extremity/Trunk Assessment Upper Extremity Assessment Upper Extremity Assessment: Overall WFL for tasks assessed   Lower Extremity  Assessment Lower Extremity Assessment: Defer to PT evaluation   Cervical / Trunk Assessment Cervical / Trunk Assessment: Other exceptions Cervical / Trunk Exceptions: S/p aneurysm abdominal aortic repair.    Communication Communication Communication: No difficulties   Cognition Arousal/Alertness: Awake/alert Behavior During Therapy: Flat affect Overall Cognitive Status: Within Functional Limits for tasks assessed                                     General Comments  Son present throughout session. HR 126. SpO2 84-92% on 4L O2. BP stable. Provided cues for purse lip breathing.    Exercises     Shoulder Instructions      Home Living Family/patient expects to be discharged to:: Private residence Living Arrangements: Children(son) Available Help at Discharge: Family;Available PRN/intermittently Type of Home: House Home Access: Stairs to enter [employer] CorporationEntrance Stairs-Number of Steps: 5 Entrance Stairs-Rails: Right;Left Home Layout: One level     Bathroom Shower/Tub: IT trainerTub/shower unit;Curtain   Bathroom Toilet: Handicapped height     Home Equipment: None          Prior Functioning/Environment Level of Independence: Independent        Comments: enjoys watching movies and collecting coupons        OT Problem List: Decreased safety awareness;Decreased knowledge of use of DME or AE;Decreased knowledge of precautions;Decreased strength;Decreased range of motion;Decreased activity tolerance;Impaired balance (sitting and/or standing);Pain      OT Treatment/Interventions: Self-care/ADL training;Therapeutic exercise;Energy conservation;DME and/or AE instruction;Therapeutic activities;Patient/family education    OT Goals(Current goals can be found in the care plan section) Acute Rehab OT Goals Patient Stated Goal: "Go home" OT Goal Formulation: With patient Time For Goal Achievement: 05/14/18 Potential to Achieve Goals: Good ADL Goals Pt Will Perform Upper Body  Dressing: with modified independence;sitting Pt Will Perform Lower Body Dressing: with modified independence;sit to/from stand;with adaptive equipment Pt Will Transfer to Toilet: with modified independence;ambulating;bedside commode Pt Will Perform Toileting - Clothing Manipulation and hygiene: with modified independence;sit to/from stand;sitting/lateral leans Pt Will Perform Tub/Shower Transfer: Tub transfer;shower seat;ambulating;rolling walker;with min guard assist Additional ADL Goal #1: Pt will be able to perform bed mobility with supervision in preparation for ADLs  OT Frequency: Min 3X/week   Barriers to D/C:            Co-evaluation PT/OT/SLP Co-Evaluation/Treatment: Yes Reason for Co-Treatment: For patient/therapist safety;To address functional/ADL transfers PT goals addressed during session: Mobility/safety with mobility OT goals addressed during session: ADL's and self-care      AM-PAC OT "6 Clicks" Daily Activity     Outcome Measure Help from another person eating meals?: Total Help from another person taking care of personal grooming?: A Little Help from another person toileting, which includes using toliet, bedpan, or urinal?: A Lot Help from another person bathing (including washing, rinsing, drying)?: A Lot Help from another person to put on and taking off regular upper body clothing?: A Little Help from another person to put on and taking off regular lower body clothing?: A Lot 6 Click Score: 13   End of Session Equipment Utilized During Treatment: Oxygen(4L) Nurse Communication: Mobility status;Precautions  Activity Tolerance: Patient tolerated treatment well;Patient limited by fatigue;Patient limited by pain Patient left: in chair;with  call bell/phone within reach;with chair alarm set;with family/visitor present  OT Visit Diagnosis: Unsteadiness on feet (R26.81);Other abnormalities of gait and mobility (R26.89);Muscle weakness (generalized) (M62.81);Pain Pain -  part of body: Gaetano Hawthorne)                Time: 3383-2919 OT Time Calculation (min): 27 min Charges:  OT General Charges $OT Visit: 1 Visit OT Evaluation $OT Eval Moderate Complexity: 1 Mod  Samora Jernberg MSOT, OTR/L Acute Rehab Pager: [phone] Office: [phone]  Theodoro Grist Giovanni Bath 04/30/2018, 11:47 AM

## 2018-04-30 NOTE — Progress Notes (Signed)
Subjective: Interval History: none.. Remains stable extubated.  Some shortness of breath and some nausea.  No flatus  Objective: Vital signs in last 24 hours: Temp:  [97.8 F (36.6 C)-98.6 F (37 C)] 97.8 F (36.6 C) (02/02 0315) Pulse Rate:  [82-92] 92 (02/02 0508) Resp:  [13-25] 18 (02/02 0730) BP: (100-142)/(57-84) 124/60 (02/02 0730) SpO2:  [93 %-98 %] 94 % (02/02 0730) Arterial Line BP: (82-174)/(65-105) 88/72 (02/02 0730) FiO2 (%):  [40 %] 40 % (02/01 1000)  Intake/Output from previous day: 02/01 0701 - 02/02 0700 In: 1036 [I.V.:926; NG/GT:60; IV Piggyback:50] Out: 1310 [Urine:760; Emesis/NG output:550] Intake/Output this shift: No intake/output data recorded.  Abdomen slightly tender and mildly distended.  Feet cool but well-perfused  Lab Results: Recent Labs    04/29/18 1800 04/30/18 0413  WBC 11.4* 11.2*  HGB 13.9 13.5  HCT 42.7 41.3  PLT 47* 50*   BMET Recent Labs    04/29/18 0409  04/29/18 0952 04/30/18 0413  NA 139   < > 140 139  K 4.0   < > 4.1 3.9  CL 107  --   --  106  CO2 22  --   --  25  GLUCOSE 150*  --   --  122*  BUN 12  --   --  13  CREATININE 1.25*  --   --  0.96  CALCIUM 8.1*  --   --  8.3*   < > = values in this interval not displayed.    Studies/Results: Ct Abdomen Pelvis W Contrast  Result Date: 04/28/2018 CLINICAL DATA:  Right lower quadrant pain, nausea. EXAM: CT ABDOMEN AND PELVIS WITH CONTRAST TECHNIQUE: Multidetector CT imaging of the abdomen and pelvis was performed using the standard protocol following bolus administration of intravenous contrast. CONTRAST:  ISOVUE-300 IOPAMIDOL (ISOVUE-300) INJECTION 61% COMPARISON:  None. FINDINGS: Lower chest: Heart is normal size. Descending thoracic aorta is heavily calcified. Dependent atelectasis in the lower lobes. No effusions. Hepatobiliary: Scattered hypodensities in the liver, likely small cysts. Gallbladder unremarkable. Pancreas: No focal abnormality or ductal dilatation.  Spleen: No focal abnormality.  Normal size. Adrenals/Urinary Tract: No adrenal abnormality. No focal renal abnormality. No stones or hydronephrosis. Urinary bladder is unremarkable. Stomach/Bowel: Stomach, large and small bowel grossly unremarkable. Vascular/Lymphatic: There is an 8.3 cm infrarenal abdominal aortic aneurysm. Extensive thrombus/plaque noted. In addition, abnormal soft tissue is noted adjacent to the right side of the aneurysm and extending around the right kidney and in the right retroperitoneum compatible with large hematoma, likely related to aortic rupture. No visible active extravasation. Aneurysm in at the aortic bifurcation. Iliac vessels are heavily calcified, normal caliber. No adenopathy. Reproductive: Uterus and adnexa unremarkable.  No mass. Other: Large right retroperitoneal hematoma noted surrounding the right kidney and extending into the upper pelvis. No free fluid or free air. Bilateral inguinal hernias noted containing fat. Musculoskeletal: No acute bony abnormality. IMPRESSION: 8.3 cm infrarenal abdominal aortic aneurysm with large right retroperitoneal hematoma most compatible with aortic rupture. Critical Value/emergent results were called by telephone at the time of interpretation on 04/28/2018 at 6:40 pm to Dr. Benjiman Core , who verbally acknowledged these results. Electronically Signed   By: Charlett Nose M.D.   On: 04/28/2018 18:40   Dg Chest Port 1 View  Result Date: 04/30/2018 CLINICAL DATA:  Abdominal aortic aneurysm. EXAM: PORTABLE CHEST 1 VIEW COMPARISON:  Radiograph of April 29, 2018. FINDINGS: Stable cardiomediastinal silhouette. Atherosclerosis of thoracic aorta is noted. Endotracheal tube and Swan-Ganz catheter have been  removed. Distal tip of nasogastric tube is seen in the stomach. No pneumothorax is noted. Mild bibasilar atelectasis or infiltrates are noted with small pleural effusions. Bony thorax is unremarkable. IMPRESSION: Mild bibasilar atelectasis or  infiltrates are noted with small pleural effusions, right greater than left. Endotracheal tube has been removed. Nasogastric tube is in grossly good position. Electronically Signed   By: Lupita Raider, M.D.   On: 04/30/2018 07:46   Dg Chest Port 1 View  Result Date: 04/29/2018 CLINICAL DATA:  Followup intubated patient. EXAM: PORTABLE CHEST 1 VIEW COMPARISON:  04/29/2018 at 0052 hours FINDINGS: Right internal jugular Swan-Ganz catheter, endotracheal tube and nasal/orogastric tube are stable and well positioned. There are prominent bronchovascular markings. Additional opacity is noted in the lung bases likely a combination of small effusions and atelectasis. This is accentuated by low lung volumes. Findings are without change from the earlier exam. No pneumothorax. IMPRESSION: 1. No significant change from the study obtained earlier today. 2. Support apparatus is well positioned. 3. Prominent bronchovascular markings and persistent lung base opacity consistent with small effusions and atelectasis. No convincing pneumonia or pulmonary edema. Electronically Signed   By: Amie Portland M.D.   On: 04/29/2018 11:56   Dg Chest Port 1 View  Result Date: 04/29/2018 CLINICAL DATA:  Postop abdominal aortic aneurysm stent. EXAM: PORTABLE CHEST 1 VIEW COMPARISON:  12/19/2014 FINDINGS: Endotracheal tube with tip measuring 4.2 cm above the carina. Enteric tube tip is off the field of view but below the left hemidiaphragm. Right Swan-Ganz catheter with tip over the pulmonary outflow tract. No pneumothorax. Shallow inspiration with linear atelectasis in the lung bases, greater on the left. Probable small left pleural effusion. Heart size and pulmonary vascularity are normal for technique. No focal consolidation in the lungs. Mediastinal contours appear intact. Calcification of the aorta. IMPRESSION: Appliances appear in satisfactory location. Shallow inspiration with atelectasis in the lung bases, greater on the left.  Probable small left pleural effusion. Electronically Signed   By: Burman Nieves M.D.   On: 04/29/2018 01:16   Anti-infectives: Anti-infectives (From admission, onward)   Start     Dose/Rate Route Frequency Ordered Stop   04/29/18 0400  ceFAZolin (ANCEF) IVPB 2g/100 mL premix     2 g 200 mL/hr over 30 Minutes Intravenous Every 8 hours 04/29/18 0044 04/29/18 1259      Assessment/Plan: s/p Procedure(s): ANEURYSM ABDOMINAL AORTIC REPAIR WITH HEMASHIELD GOLD VASCULAR GRAFT 22X11 mm (N/A) All labs remained stable.  200 cc out NG over the last 12 hours.  Will DC NG and DC A-line.  Keep Foley.  Continue to mobilize.  Give Lasix 20 mg IV   LOS: 2 days   Tawanna Cooler Aldon Hengst 04/30/2018, 8:33 AM

## 2018-04-30 NOTE — Progress Notes (Signed)
NAME:  Leah Mata, MRN:  [id number], DOB:  03/31/1945, LOS: 2 ADMISSION DATE:  04/28/2018, CONSULTATION DATMckinley Jewel:  04/29/2018 REFERRING MD:  Dr. Arbie CookeyEarly, CHIEF COMPLAINT:  AAA  Brief History   [age] yoF presenting with acute onset of RLQ pain w/N/V and radiation to back with hypotension found to have ruptured AAA.  Taken to emergently to OR  History of present illness   HPI obtained from medical chart review as patient is intubated and sedated on mechanical ventilation.  73 year old female with history of current smoker, COPD, HTN, and HLD who presented [facility] with acute onset of right sided abdominal pain with radiation to the back with associated nausea and vomiting.  Found to be pale and hypotensive in ER.  Initial Hgb 11.9.  CT abd/ pelvis showed 8.2 cm infrarenal abdominal aortic aneurysm with retroperitoneal rupture.  [facility] and take to OR with Vascular surgery  Past Medical History  Current smoker, COPD, HTN, and HLD   Significant Hospital Events   1/31 Admit-> OR  Consults:   Procedures:  1/31 OR-> AAA repair 1/31 ETT >>  Significant Diagnostic Tests:  1/31 >> CT abd/ pelvis >> 8.3 cm infrarenal abdominal aortic aneurysm with large right retroperitoneal hematoma most compatible with aortic rupture.  Micro Data:   Antimicrobials:  1/31 Cefazolin preop  Interim history/subjective:  Extubated 2/1 , doing well. Sitting up in chair . O2 sats good on [place] at 4l/m    Objective   Blood pressure 129/61, pulse 92, temperature 98 F (36.7 C), temperature source Oral, resp. rate 15, height 5\' 4"  (1.626 m), weight 69.9 kg, last menstrual period 03/29/1993, SpO2 94 %. PAP: (42)/(28) 42/28 CVP:  [19 mmHg] 19 mmHg      Intake/Output Summary (Last 24 hours) at 04/30/2018 1226 Last data filed at 04/30/2018 1209 Gross per 24 hour  Intake 1011.22 ml  Output 1700 ml  Net -688.78 ml   Filed Weights   04/28/18 1603  Weight: 69.9 kg    Examination: General: Elderly female sitting up in chair  HEENT: NCAT, moist oral mucosa  Neuro: Alert and oriented x3 no focal deficits detected  CV: RRR, no MRG  PULM: Clear to all station bilaterally  GI: Nontender positive bowel sounds  Extremities: Intact no edema Skin: No rash  Resolved Hospital Problem list    Assessment & Plan:  Ruptured infrarenal AAA s/p open repair with EBL ~4L P:  SBP goal less than 160, hydralazine and labetalol ordered Ongoing neurovascular checks Following serial CBC    ABLA w/EBL 4.2L -s/p 10 units PRBC, 6 FFP, cell saver, albumin x 4, and LR x 7L 2/2 plt tr up 47>50  P:  Following CBC Fibrinogen 335 , INR 1.1   Acute respiratory insufficiency in the post-operative setting P:  Extubated 2/1 , doing very well  Continue to mobilize .     Acute renal insufficiency, presumed ATN due to hypoperfusion P:  Follow renal function with volume resuscitation Follow urine output  COPD/ ongoing tobacco abuse P:  Bronchodilators as ordered   Hyperglycemia P:  Sliding-scale insulin protocol  Hx HTN P:  Cont to monitor   Best practice:  Diet: NPO Pain/Anxiety/Delirium protocol (if indicated): propofol/ fentanyl  VAP protocol (if indicated): yes DVT prophylaxis: heparin SQ GI prophylaxis: protonix Glucose control: SSI  Mobility: BR Code Status: Full  Family Communication:  Disposition: ICU  PCCM to sign off , call if needed.   Labs  CBC: Recent Labs  Lab 04/28/18 1649  04/29/18 0040  04/29/18 0409 04/29/18 0429 04/29/18 0952 04/29/18 1800 04/30/18 0413  WBC 11.1*  --  9.6  --  10.2  --   --  11.4* 11.2*  NEUTROABS 9.1*  --   --   --   --   --   --   --   --   HGB 11.7*   < > 13.6   < > 13.9 12.6 13.3 13.9 13.5  HCT 37.0   < > 41.5   < > 40.6 37.0 39.0 42.7 41.3  MCV 95.9  --  89.1  --  87.7  --   --  89.0 90.2  PLT 260  --  42*  --  42*  42*  --   --  47* 50*   < > = values in this interval not displayed.     Basic Metabolic Panel: Recent Labs  Lab 04/28/18 1649  04/29/18 0040 04/29/18 0048 04/29/18 0409 04/29/18 0429 04/29/18 0952 04/30/18 0413  NA 133*   < > 141 140 139 140 140 139  K 4.2   < > 5.0 5.1 4.0 4.0 4.1 3.9  CL 102  --  109  --  107  --   --  106  CO2 20*  --  21*  --  22  --   --  25  GLUCOSE 180*  --  157*  --  150*  --   --  122*  BUN 13  --  10  --  12  --   --  13  CREATININE 1.00  --  1.19*  --  1.25*  --   --  0.96  CALCIUM 9.3  --  8.0*  --  8.1*  --   --  8.3*  MG  --   --  1.5*  --  1.6*  --   --  1.7   < > = values in this interval not displayed.   GFR: Estimated Creatinine Clearance: 51.6 mL/min (by C-G formula based on SCr of 0.96 mg/dL). Recent Labs  Lab 04/29/18 0040 04/29/18 0409 04/29/18 1800 04/30/18 0413  WBC 9.6 10.2 11.4* 11.2*    Liver Function Tests: Recent Labs  Lab 04/28/18 1649 04/29/18 0409  AST 20 97*  ALT 16 63*  ALKPHOS 65 33*  BILITOT 0.6 1.0  PROT 6.7 4.5*  ALBUMIN 3.6 3.0*   Recent Labs  Lab 04/29/18 0409  AMYLASE 29   No results for input(s): AMMONIA in the last 168 hours.  ABG    Component Value Date/Time   PHART 7.344 (L) 04/29/2018 0952   PCO2ART 46.3 04/29/2018 0952   PO2ART 78.0 (L) 04/29/2018 0952   HCO3 25.2 04/29/2018 0952   TCO2 27 04/29/2018 0952   ACIDBASEDEF 1.0 04/29/2018 0952   O2SAT 95.0 04/29/2018 0952     Coagulation Profile: Recent Labs  Lab 04/28/18 1842 04/29/18 0040 04/29/18 0409 04/30/18 0413  INR 1.00 1.47 1.33 1.16    Cardiac Enzymes: No results for input(s): CKTOTAL, CKMB, CKMBINDEX, TROPONINI in the last 168 hours.  HbA1C: Hemoglobin A1C  Date/Time Value Ref Range Status  03/12/2017 11:47 AM 5.8  Final   Hgb A1c MFr Bld  Date/Time Value Ref Range Status  09/07/2016 10:09 AM 6.5 4.6 - 6.5 % Final    Comment:    Glycemic Control Guidelines for People with Diabetes:Non Diabetic:  <6%Goal of Therapy: <7%Additional Action Suggested:  >8%   03/03/2016 08:56  AM 6.2  4.6 - 6.5 % Final    Comment:    Glycemic Control Guidelines for People with Diabetes:Non Diabetic:  <6%Goal of Therapy: <7%Additional Action Suggested:  >8%     CBG: Recent Labs  Lab 04/29/18 1649 04/29/18 1943 04/30/18 0033 04/30/18 0324 04/30/18 0800  GLUCAP 97 112* 109* 103* 102*   Critical care time:      NP-C  [facility]  [phone]  04/30/2018

## 2018-04-30 NOTE — Progress Notes (Signed)
SLP Cancellation Note  Patient Details Name: Leah Mata MRN: [id number] DOB: October 21, 1945   Cancelled treatment:       Reason Eval/Treat Not Completed: Medical issues which prohibited therapy. D/w RN; NG removed this morning, per MD pt to remain NPO today. Will follow up next date for PO readiness.  Rondel Baton, [place], CCC-SLP Speech-Language Pathologist Acute Rehabilitation Services Pager: [phone] Office: [phone]    Arlana Lindau 04/30/2018, 10:44 AM

## 2018-05-01 ENCOUNTER — Encounter (HOSPITAL_COMMUNITY): Payer: Self-pay | Admitting: Vascular Surgery

## 2018-05-01 LAB — PREPARE FRESH FROZEN PLASMA
UNIT DIVISION: 0
UNIT DIVISION: 0
Unit division: 0
Unit division: 0
Unit division: 0
Unit division: 0
Unit division: 0
Unit division: 0
Unit division: 0
Unit division: 0

## 2018-05-01 LAB — BPAM FFP
BLOOD PRODUCT EXPIRATION DATE: 202002022359
Blood Product Expiration Date: 202002022359
Blood Product Expiration Date: 202002022359
Blood Product Expiration Date: 202002022359
Blood Product Expiration Date: 202002022359
Blood Product Expiration Date: 202002022359
Blood Product Expiration Date: 202002022359
Blood Product Expiration Date: 202002022359
Blood Product Expiration Date: 202002022359
Blood Product Expiration Date: 202002022359
Blood Product Expiration Date: 202002022359
Blood Product Expiration Date: 202002022359
ISSUE DATE / TIME: 202001282054
ISSUE DATE / TIME: 202001312024
ISSUE DATE / TIME: 202001312024
ISSUE DATE / TIME: 202001312024
ISSUE DATE / TIME: 202001312129
ISSUE DATE / TIME: 202001312129
ISSUE DATE / TIME: 202001312250
ISSUE DATE / TIME: 202001312024
ISSUE DATE / TIME: 202001312250
UNIT TYPE AND RH: 5100
UNIT TYPE AND RH: 5100
Unit Type and Rh: 5100
Unit Type and Rh: 5100
Unit Type and Rh: 5100
Unit Type and Rh: 5100
Unit Type and Rh: 5100
Unit Type and Rh: 5100
Unit Type and Rh: 5100
Unit Type and Rh: 9500
Unit Type and Rh: 9500
Unit Type and Rh: 9500

## 2018-05-01 LAB — ABO/RH: ABO/RH(D): O NEG

## 2018-05-01 LAB — GLUCOSE, CAPILLARY
Glucose-Capillary: 116 mg/dL — ABNORMAL HIGH (ref 70–99)
Glucose-Capillary: 99 mg/dL (ref 70–99)
Glucose-Capillary: 112 mg/dL — ABNORMAL HIGH (ref 70–99)
Glucose-Capillary: 103 mg/dL — ABNORMAL HIGH (ref 70–99)
Glucose-Capillary: 120 mg/dL — ABNORMAL HIGH (ref 70–99)

## 2018-05-01 LAB — PREPARE RBC (CROSSMATCH)

## 2018-05-01 LAB — BLOOD PRODUCT ORDER (VERBAL) VERIFICATION

## 2018-05-01 MED ORDER — FUROSEMIDE 10 MG/ML IJ SOLN
20.0000 mg | Freq: Once | INTRAMUSCULAR | Status: AC
  Administered 2018-05-01: 20 mg via INTRAVENOUS
  Filled 2018-05-01: qty 2

## 2018-05-01 MED ORDER — BISACODYL 10 MG RE SUPP
10.0000 mg | Freq: Once | RECTAL | Status: AC
  Administered 2018-05-01: 10 mg via RECTAL
  Filled 2018-05-01: qty 1

## 2018-05-01 NOTE — Progress Notes (Signed)
SLP Cancellation Note  Patient Details Name: Leah Mata MRN: [id number] DOB: [birth year]   Cancelled treatment:       Reason Eval/Treat Not Completed: Medical issues which prohibited therapy - Per MD, pt continues to have ileus due to open ruptured aneurysm repair. Will continue NPO status until appropriate to do so. No po trials given at this time.  Trisa Cranor B. Murvin Natal [facility], CCC-SLP Speech Language Pathologist [phone]  Leigh Aurora 05/01/2018, 11:51 AM

## 2018-05-01 NOTE — Plan of Care (Signed)
Pt ambulated this shift, walked 44ft. Pt tolerated fairly well. Pt did get SOB during ambulation and pt got tachy after returning to room. SBP then was in the 170's. Went ahead and gave prn hydralazine, last SBP 142. Current vital signs are stable, see validated data. At 1150 this shift, received verbal orders to keep foley in till 5am, from MD Early.  Pt currently in bed with family at bedside. Dressing changes done. Prn meds given as ordered. No new changes at this time. Will continue to monitor.  Problem: Education: Goal: Understanding of CV disease, CV risk reduction, and recovery process will improve Outcome: Progressing Goal: Individualized Educational Video(s) Outcome: Progressing   Problem: Activity: Goal: Ability to return to baseline activity level will improve Outcome: Progressing   Problem: Cardiovascular: Goal: Ability to achieve and maintain adequate cardiovascular perfusion will improve Outcome: Progressing Goal: Vascular access site(s) Level 0-1 will be maintained Outcome: Progressing   Problem: Health Behavior/Discharge Planning: Goal: Ability to safely manage health-related needs after discharge will improve Outcome: Progressing   Problem: Education: Goal: Knowledge of General Education information will improve Description Including pain rating scale, medication(s)/side effects and non-pharmacologic comfort measures Outcome: Progressing   Problem: Health Behavior/Discharge Planning: Goal: Ability to manage health-related needs will improve Outcome: Progressing   Problem: Clinical Measurements: Goal: Ability to maintain clinical measurements within normal limits will improve Outcome: Progressing Goal: Will remain free from infection Outcome: Progressing Goal: Diagnostic test results will improve Outcome: Progressing Goal: Respiratory complications will improve Outcome: Progressing Goal: Cardiovascular complication will be avoided Outcome: Progressing    Problem: Activity: Goal: Risk for activity intolerance will decrease Outcome: Progressing   Problem: Nutrition: Goal: Adequate nutrition will be maintained Outcome: Progressing   Problem: Coping: Goal: Level of anxiety will decrease Outcome: Progressing   Problem: Elimination: Goal: Will not experience complications related to bowel motility Outcome: Progressing Goal: Will not experience complications related to urinary retention Outcome: Progressing   Problem: Pain Managment: Goal: General experience of comfort will improve Outcome: Progressing   Problem: Safety: Goal: Ability to remain free from injury will improve Outcome: Progressing   Problem: Skin Integrity: Goal: Risk for impaired skin integrity will decrease Outcome: Progressing

## 2018-05-01 NOTE — Progress Notes (Signed)
Subjective: Interval History: none.. Sitting up in chair.  No flatus yet.  Objective: Vital signs in last 24 hours: Temp:  [97.2 F (36.2 C)-98.3 F (36.8 C)] 98.3 F (36.8 C) (02/03 0400) Pulse Rate:  [88-95] 95 (02/02 2001) Resp:  [14-24] 23 (02/03 0600) BP: (114-151)/(60-81) 137/76 (02/03 0600) SpO2:  [90 %-99 %] 95 % (02/03 0600) Arterial Line BP: (88-95)/(72-76) 95/76 (02/02 0800) Weight:  [83.9 kg] 83.9 kg (02/03 0600)  Intake/Output from previous day: 02/02 0701 - 02/03 0700 In: 1881.8 [P.O.:150; I.V.:1681.8; IV Piggyback:50] Out: 1075 [Urine:1075] Intake/Output this shift: No intake/output data recorded.  Abdomen soft and moderately distended.  2-3+ popliteal pulse.  Lab Results: Recent Labs    04/29/18 1800 04/30/18 0413  WBC 11.4* 11.2*  HGB 13.9 13.5  HCT 42.7 41.3  PLT 47* 50*   BMET Recent Labs    04/29/18 0409  04/29/18 0952 04/30/18 0413  NA 139   < > 140 139  K 4.0   < > 4.1 3.9  CL 107  --   --  106  CO2 22  --   --  25  GLUCOSE 150*  --   --  122*  BUN 12  --   --  13  CREATININE 1.25*  --   --  0.96  CALCIUM 8.1*  --   --  8.3*   < > = values in this interval not displayed.    Studies/Results: Ct Abdomen Pelvis W Contrast  Result Date: 04/28/2018 CLINICAL DATA:  Right lower quadrant pain, nausea. EXAM: CT ABDOMEN AND PELVIS WITH CONTRAST TECHNIQUE: Multidetector CT imaging of the abdomen and pelvis was performed using the standard protocol following bolus administration of intravenous contrast. CONTRAST:  ISOVUE-300 IOPAMIDOL (ISOVUE-300) INJECTION 61% COMPARISON:  None. FINDINGS: Lower chest: Heart is normal size. Descending thoracic aorta is heavily calcified. Dependent atelectasis in the lower lobes. No effusions. Hepatobiliary: Scattered hypodensities in the liver, likely small cysts. Gallbladder unremarkable. Pancreas: No focal abnormality or ductal dilatation. Spleen: No focal abnormality.  Normal size. Adrenals/Urinary Tract: No  adrenal abnormality. No focal renal abnormality. No stones or hydronephrosis. Urinary bladder is unremarkable. Stomach/Bowel: Stomach, large and small bowel grossly unremarkable. Vascular/Lymphatic: There is an 8.3 cm infrarenal abdominal aortic aneurysm. Extensive thrombus/plaque noted. In addition, abnormal soft tissue is noted adjacent to the right side of the aneurysm and extending around the right kidney and in the right retroperitoneum compatible with large hematoma, likely related to aortic rupture. No visible active extravasation. Aneurysm in at the aortic bifurcation. Iliac vessels are heavily calcified, normal caliber. No adenopathy. Reproductive: Uterus and adnexa unremarkable.  No mass. Other: Large right retroperitoneal hematoma noted surrounding the right kidney and extending into the upper pelvis. No free fluid or free air. Bilateral inguinal hernias noted containing fat. Musculoskeletal: No acute bony abnormality. IMPRESSION: 8.3 cm infrarenal abdominal aortic aneurysm with large right retroperitoneal hematoma most compatible with aortic rupture. Critical Value/emergent results were called by telephone at the time of interpretation on 04/28/2018 at 6:40 pm to Dr. Benjiman Core , who verbally acknowledged these results. Electronically Signed   By: Charlett Nose M.D.   On: 04/28/2018 18:40   Dg Chest Port 1 View  Result Date: 04/30/2018 CLINICAL DATA:  Abdominal aortic aneurysm. EXAM: PORTABLE CHEST 1 VIEW COMPARISON:  Radiograph of April 29, 2018. FINDINGS: Stable cardiomediastinal silhouette. Atherosclerosis of thoracic aorta is noted. Endotracheal tube and Swan-Ganz catheter have been removed. Distal tip of nasogastric tube is seen in the stomach.  No pneumothorax is noted. Mild bibasilar atelectasis or infiltrates are noted with small pleural effusions. Bony thorax is unremarkable. IMPRESSION: Mild bibasilar atelectasis or infiltrates are noted with small pleural effusions, right greater than  left. Endotracheal tube has been removed. Nasogastric tube is in grossly good position. Electronically Signed   By: Lupita Raider, M.D.   On: 04/30/2018 07:46   Dg Chest Port 1 View  Result Date: 04/29/2018 CLINICAL DATA:  Followup intubated patient. EXAM: PORTABLE CHEST 1 VIEW COMPARISON:  04/29/2018 at 0052 hours FINDINGS: Right internal jugular Swan-Ganz catheter, endotracheal tube and nasal/orogastric tube are stable and well positioned. There are prominent bronchovascular markings. Additional opacity is noted in the lung bases likely a combination of small effusions and atelectasis. This is accentuated by low lung volumes. Findings are without change from the earlier exam. No pneumothorax. IMPRESSION: 1. No significant change from the study obtained earlier today. 2. Support apparatus is well positioned. 3. Prominent bronchovascular markings and persistent lung base opacity consistent with small effusions and atelectasis. No convincing pneumonia or pulmonary edema. Electronically Signed   By: Amie Portland M.D.   On: 04/29/2018 11:56   Dg Chest Port 1 View  Result Date: 04/29/2018 CLINICAL DATA:  Postop abdominal aortic aneurysm stent. EXAM: PORTABLE CHEST 1 VIEW COMPARISON:  12/19/2014 FINDINGS: Endotracheal tube with tip measuring 4.2 cm above the carina. Enteric tube tip is off the field of view but below the left hemidiaphragm. Right Swan-Ganz catheter with tip over the pulmonary outflow tract. No pneumothorax. Shallow inspiration with linear atelectasis in the lung bases, greater on the left. Probable small left pleural effusion. Heart size and pulmonary vascularity are normal for technique. No focal consolidation in the lungs. Mediastinal contours appear intact. Calcification of the aorta. IMPRESSION: Appliances appear in satisfactory location. Shallow inspiration with atelectasis in the lung bases, greater on the left. Probable small left pleural effusion. Electronically Signed   By: Burman Nieves M.D.   On: 04/29/2018 01:16   Anti-infectives: Anti-infectives (From admission, onward)   Start     Dose/Rate Route Frequency Ordered Stop   04/29/18 0400  ceFAZolin (ANCEF) IVPB 2g/100 mL premix     2 g 200 mL/hr over 30 Minutes Intravenous Every 8 hours 04/29/18 0044 04/29/18 1259      Assessment/Plan: s/p Procedure(s): ANEURYSM ABDOMINAL AORTIC REPAIR WITH HEMASHIELD GOLD VASCULAR GRAFT 22X11 mm (N/A) Stable overall.  Need to work on pulmonary toilet.  Will begin to walk today.   LOS: 3 days   Leah Mata 05/01/2018, 7:11 AM

## 2018-05-02 ENCOUNTER — Inpatient Hospital Stay (HOSPITAL_COMMUNITY): Payer: Medicare PPO

## 2018-05-02 LAB — TYPE AND SCREEN
ABO/RH(D): O NEG
ABO/RH(D): O POS
Antibody Screen: NEGATIVE
Antibody Screen: NEGATIVE
UNIT DIVISION: 0
UNIT DIVISION: 0
UNIT DIVISION: 0
UNIT DIVISION: 0
UNIT DIVISION: 0
UNIT DIVISION: 0
UNIT DIVISION: 0
UNIT DIVISION: 0
UNIT DIVISION: 0
Unit division: 0
Unit division: 0
Unit division: 0
Unit division: 0
Unit division: 0
Unit division: 0
Unit division: 0
Unit division: 0
Unit division: 0
Unit division: 0
Unit division: 0
Unit division: 0

## 2018-05-02 LAB — BPAM RBC
BLOOD PRODUCT EXPIRATION DATE: 202002112359
BLOOD PRODUCT EXPIRATION DATE: 202003042359
BLOOD PRODUCT EXPIRATION DATE: 202003042359
Blood Product Expiration Date: 202002172359
Blood Product Expiration Date: 202002112359
Blood Product Expiration Date: 202003042359
Blood Product Expiration Date: 202003042359
Blood Product Expiration Date: 202003042359
Blood Product Expiration Date: 202003042359
Blood Product Expiration Date: 202003042359
Blood Product Expiration Date: 202003042359
Blood Product Expiration Date: 202003042359
Blood Product Expiration Date: 202003042359
Blood Product Expiration Date: 202003042359
Blood Product Expiration Date: 202003042359
Blood Product Expiration Date: 202003042359
Blood Product Expiration Date: 202003042359
Blood Product Expiration Date: 202003042359
Blood Product Expiration Date: 202003042359
Blood Product Expiration Date: 202003042359
Blood Product Expiration Date: 202003042359
ISSUE DATE / TIME: 202001311851
ISSUE DATE / TIME: 202001311952
ISSUE DATE / TIME: 202001311952
ISSUE DATE / TIME: 202001311952
ISSUE DATE / TIME: 202001311952
ISSUE DATE / TIME: 202001312023
ISSUE DATE / TIME: 202001312023
ISSUE DATE / TIME: 202001312023
ISSUE DATE / TIME: 202001312023
ISSUE DATE / TIME: 202001312130
ISSUE DATE / TIME: 202001312130
ISSUE DATE / TIME: 202001312251
ISSUE DATE / TIME: 202002011121
ISSUE DATE / TIME: 202002012008
ISSUE DATE / TIME: 202002020025
ISSUE DATE / TIME: 202002020027
ISSUE DATE / TIME: 202002031130
ISSUE DATE / TIME: 202002031644
UNIT TYPE AND RH: 5100
UNIT TYPE AND RH: 5100
UNIT TYPE AND RH: 5100
UNIT TYPE AND RH: 5100
Unit Type and Rh: 9500
Unit Type and Rh: 5100
Unit Type and Rh: 5100
Unit Type and Rh: 5100
Unit Type and Rh: 5100
Unit Type and Rh: 5100
Unit Type and Rh: 5100
Unit Type and Rh: 5100
Unit Type and Rh: 5100
Unit Type and Rh: 5100
Unit Type and Rh: 5100
Unit Type and Rh: 5100
Unit Type and Rh: 5100
Unit Type and Rh: 5100
Unit Type and Rh: 5100
Unit Type and Rh: 5100
Unit Type and Rh: 5100

## 2018-05-02 LAB — BASIC METABOLIC PANEL
Anion gap: 6 (ref 5–15)
BUN: 15 mg/dL (ref 8–23)
CHLORIDE: 104 mmol/L (ref 98–111)
CO2: 27 mmol/L (ref 22–32)
Calcium: 9.2 mg/dL (ref 8.9–10.3)
Creatinine, Ser: 0.75 mg/dL (ref 0.44–1.00)
GFR calc Af Amer: >60 mL/min (ref >60)
GFR calc non Af Amer: >60 mL/min (ref >60)
Glucose, Bld: 106 mg/dL — ABNORMAL HIGH (ref 70–99)
Potassium: 4.1 mmol/L (ref 3.5–5.1)
SODIUM: 137 mmol/L (ref 135–145)

## 2018-05-02 LAB — CBC
HCT: 38.9 % (ref 36.0–46.0)
Hemoglobin: 12.8 g/dL (ref 12.0–15.0)
MCH: 30 pg (ref 26.0–34.0)
MCHC: 32.9 g/dL (ref 30.0–36.0)
MCV: 91.3 fL (ref 80.0–100.0)
Platelets: 66 10*3/uL — ABNORMAL LOW (ref 150–400)
RBC: 4.26 MIL/uL (ref 3.87–5.11)
RDW: 14.9 % (ref 11.5–15.5)
WBC: 10.3 10*3/uL (ref 4.0–10.5)
nRBC: 0 % (ref 0.0–0.2)

## 2018-05-02 LAB — TRIGLYCERIDES: Triglycerides: 182 mg/dL — ABNORMAL HIGH (ref <150)

## 2018-05-02 LAB — GLUCOSE, CAPILLARY
Glucose-Capillary: 102 mg/dL — ABNORMAL HIGH (ref 70–99)
Glucose-Capillary: 88 mg/dL (ref 70–99)
Glucose-Capillary: 98 mg/dL (ref 70–99)

## 2018-05-02 MED ORDER — FUROSEMIDE 10 MG/ML IJ SOLN
20.0000 mg | Freq: Two times a day (BID) | INTRAMUSCULAR | Status: DC
  Administered 2018-05-02: 20 mg via INTRAVENOUS
  Filled 2018-05-02: qty 2

## 2018-05-02 MED ORDER — FUROSEMIDE 10 MG/ML IJ SOLN
40.0000 mg | Freq: Two times a day (BID) | INTRAMUSCULAR | Status: DC
  Administered 2018-05-02 – 2018-05-03 (×2): 40 mg via INTRAVENOUS
  Filled 2018-05-02 (×2): qty 4

## 2018-05-02 MED ORDER — AMLODIPINE BESYLATE 5 MG PO TABS
5.0000 mg | ORAL_TABLET | Freq: Once | ORAL | Status: AC
  Administered 2018-05-02: 5 mg via ORAL
  Filled 2018-05-02: qty 1

## 2018-05-02 MED ORDER — AMLODIPINE BESYLATE 10 MG PO TABS
10.0000 mg | ORAL_TABLET | Freq: Every day | ORAL | Status: DC
  Administered 2018-05-03 – 2018-05-05 (×3): 10 mg via ORAL
  Filled 2018-05-02 (×3): qty 1

## 2018-05-02 MED ORDER — HYDRALAZINE HCL 20 MG/ML IJ SOLN
10.0000 mg | INTRAMUSCULAR | Status: DC | PRN
  Administered 2018-05-02 – 2018-05-03 (×2): 10 mg via INTRAVENOUS
  Administered 2018-05-03 – 2018-05-14 (×6): 20 mg via INTRAVENOUS
  Filled 2018-05-02 (×6): qty 1

## 2018-05-02 MED ORDER — LISINOPRIL 20 MG PO TABS
20.0000 mg | ORAL_TABLET | Freq: Every day | ORAL | Status: DC
  Administered 2018-05-03 – 2018-05-05 (×3): 20 mg via ORAL
  Filled 2018-05-02 (×3): qty 1

## 2018-05-02 MED ORDER — METOPROLOL TARTRATE 5 MG/5ML IV SOLN
5.0000 mg | Freq: Once | INTRAVENOUS | Status: AC
  Administered 2018-05-02: 5 mg via INTRAVENOUS

## 2018-05-02 MED ORDER — MORPHINE SULFATE (PF) 2 MG/ML IV SOLN
2.0000 mg | INTRAVENOUS | Status: DC | PRN
  Administered 2018-05-02 – 2018-05-25 (×21): 2 mg via INTRAVENOUS
  Filled 2018-05-02 (×21): qty 1

## 2018-05-02 MED ORDER — AMLODIPINE BESYLATE 5 MG PO TABS: 5.0000 mg | ORAL_TABLET | Freq: Every day | ORAL | Status: DC

## 2018-05-02 MED ORDER — HYDRALAZINE HCL 20 MG/ML IJ SOLN
5.0000 mg | INTRAMUSCULAR | Status: DC | PRN
  Administered 2018-05-02: 5 mg via INTRAVENOUS
  Filled 2018-05-02: qty 1

## 2018-05-02 MED ORDER — FLUTICASONE PROPIONATE 50 MCG/ACT NA SUSP
2.0000 | Freq: Every day | NASAL | Status: DC
  Administered 2018-05-06 – 2018-05-29 (×13): 2 via NASAL
  Filled 2018-05-02: qty 16

## 2018-05-02 MED ORDER — PRAVASTATIN SODIUM 10 MG PO TABS
20.0000 mg | ORAL_TABLET | Freq: Every day | ORAL | Status: DC
  Administered 2018-05-03 – 2018-05-05 (×3): 20 mg via ORAL
  Filled 2018-05-02 (×3): qty 2

## 2018-05-02 NOTE — Plan of Care (Signed)
Pt is currently in chair, receiving breathing treatment.  Pt at 1815 got Sob after transfer from chair to [facility]. Pt HR went up to 130's and O2 sats were in the high 80's. Pt stated feeling wheezy and SOB. Paged RT for breathing treatment.  PRN meds given as ordered.  Pt family is at bedside. Vital signs currently stabilizing. Will continue to monitor.  Problem: Activity: Goal: Ability to return to baseline activity level will improve Outcome: Progressing   Problem: Education: Goal: Understanding of CV disease, CV risk reduction, and recovery process will improve Outcome: Progressing

## 2018-05-02 NOTE — Progress Notes (Signed)
Subjective: Interval History: none.. Doing well this morning.  Up in the chair.  Passing flatus.  No nausea  Objective: Vital signs in last 24 hours: Temp:  [97.7 F (36.5 C)-98.6 F (37 C)] 98.6 F (37 C) (02/04 0808) Resp:  [15-26] 24 (02/04 0800) BP: (124-172)/(67-124) 141/81 (02/04 0800) SpO2:  [84 %-99 %] 93 % (02/04 0800)  Intake/Output from previous day: 02/03 0701 - 02/04 0700 In: 1066.9 [P.O.:30; I.V.:986.9; IV Piggyback:50] Out: 1640 [Urine:1640] Intake/Output this shift: No intake/output data recorded.  Adamant soft.  Palpable pedal pulses  Lab Results: Recent Labs    04/30/18 0413 05/02/18 0114  WBC 11.2* 10.3  HGB 13.5 12.8  HCT 41.3 38.9  PLT 50* 66*   BMET Recent Labs    04/30/18 0413 05/02/18 0114  NA 139 137  K 3.9 4.1  CL 106 104  CO2 25 27  GLUCOSE 122* 106*  BUN 13 15  CREATININE 0.96 0.75  CALCIUM 8.3* 9.2    Studies/Results: Ct Abdomen Pelvis W Contrast  Result Date: 04/28/2018 CLINICAL DATA:  Right lower quadrant pain, nausea. EXAM: CT ABDOMEN AND PELVIS WITH CONTRAST TECHNIQUE: Multidetector CT imaging of the abdomen and pelvis was performed using the standard protocol following bolus administration of intravenous contrast. CONTRAST:  100mL ISOVUE-300 IOPAMIDOL (ISOVUE-300) INJECTION 61% COMPARISON:  None. FINDINGS: Lower chest: Heart is normal size. Descending thoracic aorta is heavily calcified. Dependent atelectasis in the lower lobes. No effusions. Hepatobiliary: Scattered hypodensities in the liver, likely small cysts. Gallbladder unremarkable. Pancreas: No focal abnormality or ductal dilatation. Spleen: No focal abnormality.  Normal size. Adrenals/Urinary Tract: No adrenal abnormality. No focal renal abnormality. No stones or hydronephrosis. Urinary bladder is unremarkable. Stomach/Bowel: Stomach, large and small bowel grossly unremarkable. Vascular/Lymphatic: There is an 8.3 cm infrarenal abdominal aortic aneurysm. Extensive  thrombus/plaque noted. In addition, abnormal soft tissue is noted adjacent to the right side of the aneurysm and extending around the right kidney and in the right retroperitoneum compatible with large hematoma, likely related to aortic rupture. No visible active extravasation. Aneurysm in at the aortic bifurcation. Iliac vessels are heavily calcified, normal caliber. No adenopathy. Reproductive: Uterus and adnexa unremarkable.  No mass. Other: Large right retroperitoneal hematoma noted surrounding the right kidney and extending into the upper pelvis. No free fluid or free air. Bilateral inguinal hernias noted containing fat. Musculoskeletal: No acute bony abnormality. IMPRESSION: 8.3 cm infrarenal abdominal aortic aneurysm with large right retroperitoneal hematoma most compatible with aortic rupture. Critical Value/emergent results were called by telephone at the time of interpretation on 04/28/2018 at 6:40 pm to Dr. Benjiman CoreNATHAN PICKERING , who verbally acknowledged these results. Electronically Signed   By: Charlett NoseKevin  Dover M.D.   On: 04/28/2018 18:40   Dg Chest Port 1 View  Result Date: 04/30/2018 CLINICAL DATA:  Abdominal aortic aneurysm. EXAM: PORTABLE CHEST 1 VIEW COMPARISON:  Radiograph of April 29, 2018. FINDINGS: Stable cardiomediastinal silhouette. Atherosclerosis of thoracic aorta is noted. Endotracheal tube and Swan-Ganz catheter have been removed. Distal tip of nasogastric tube is seen in the stomach. No pneumothorax is noted. Mild bibasilar atelectasis or infiltrates are noted with small pleural effusions. Bony thorax is unremarkable. IMPRESSION: Mild bibasilar atelectasis or infiltrates are noted with small pleural effusions, right greater than left. Endotracheal tube has been removed. Nasogastric tube is in grossly good position. Electronically Signed   By: Lupita RaiderJames  Green Jr, M.D.   On: 04/30/2018 07:46   Dg Chest Port 1 View  Result Date: 04/29/2018 CLINICAL DATA:  Followup intubated  patient. EXAM:  PORTABLE CHEST 1 VIEW COMPARISON:  04/29/2018 at 0052 hours FINDINGS: Right internal jugular Swan-Ganz catheter, endotracheal tube and nasal/orogastric tube are stable and well positioned. There are prominent bronchovascular markings. Additional opacity is noted in the lung bases likely a combination of small effusions and atelectasis. This is accentuated by low lung volumes. Findings are without change from the earlier exam. No pneumothorax. IMPRESSION: 1. No significant change from the study obtained earlier today. 2. Support apparatus is well positioned. 3. Prominent bronchovascular markings and persistent lung base opacity consistent with small effusions and atelectasis. No convincing pneumonia or pulmonary edema. Electronically Signed   By: Amie Portlandavid  Ormond M.D.   On: 04/29/2018 11:56   Dg Chest Port 1 View  Result Date: 04/29/2018 CLINICAL DATA:  Postop abdominal aortic aneurysm stent. EXAM: PORTABLE CHEST 1 VIEW COMPARISON:  12/19/2014 FINDINGS: Endotracheal tube with tip measuring 4.2 cm above the carina. Enteric tube tip is off the field of view but below the left hemidiaphragm. Right Swan-Ganz catheter with tip over the pulmonary outflow tract. No pneumothorax. Shallow inspiration with linear atelectasis in the lung bases, greater on the left. Probable small left pleural effusion. Heart size and pulmonary vascularity are normal for technique. No focal consolidation in the lungs. Mediastinal contours appear intact. Calcification of the aorta. IMPRESSION: Appliances appear in satisfactory location. Shallow inspiration with atelectasis in the lung bases, greater on the left. Probable small left pleural effusion. Electronically Signed   By: Burman NievesWilliam  Stevens M.D.   On: 04/29/2018 01:16   Anti-infectives: Anti-infectives (From admission, onward)   Start     Dose/Rate Route Frequency Ordered Stop   04/29/18 0400  ceFAZolin (ANCEF) IVPB 2g/100 mL premix     2 g 200 mL/hr over 30 Minutes Intravenous Every 8  hours 04/29/18 0044 04/29/18 1259      Assessment/Plan: s/p Procedure(s): ANEURYSM ABDOMINAL AORTIC REPAIR WITH HEMASHIELD GOLD VASCULAR GRAFT 22X11 mm (N/A) Low overall.  GI function returning.  Foley discontinued this morning.  Will start clear liquids.  Transfer to 4 E. stepdown.  Mobilize.  Pulmonary toilet   LOS: 4 days   Jacoya Bauman 05/02/2018, 8:30 AM

## 2018-05-02 NOTE — Progress Notes (Signed)
[facility]  Called to bedside by nursing staff secondary to tachycardia and hypertension with SOB during transfers.    Subjective  - Sitting up in chair.  Complains of SOB with activity of standing and transferring.  Feels good sitting up.  Just getting over bronchitis.  She has tolerated liquids today.   Objective (!) 175/96 95 98.6 F (37 C) (Axillary) (!) 22 93%  Intake/Output Summary (Last 24 hours) at 05/02/2018 2020 Last data filed at 05/02/2018 1815 Gross per 24 hour  Intake 961.74 ml  Output 1240 ml  Net -278.26 ml   Tachycardia 107-117 systolic, BP 170's/90's Last dose of hydralazine  HR decreased to low 100's and systolic 160's   Chest X ray   IMPRESSION: There is subtly increased bibasilar, right greater than left heterogeneous opacity and pleural effusions. There is mild underlying diffuse interstitial pulmonary opacity. Findings are concerning for worsened edema and effusions. Unchanged cardiomegaly.    She received lasix 1 dose of 1733 Cr is WNL and UO is good.   She received 5000 LR with EBL 1500 500 of cell saver given.    We will restart Hydralazine for the hypertension and I have called CCM to help with the pulmonary edema and further recommendation.   She will stay in The ICU tonight.  Transfer to 4E has been canceled.   Mosetta Pigeon 05/02/2018 8:20 PM --  Laboratory Lab Results: Recent Labs    04/30/18 0413 05/02/18 0114  WBC 11.2* 10.3  HGB 13.5 12.8  HCT 41.3 38.9  PLT 50* 66*   BMET Recent Labs    04/30/18 0413 05/02/18 0114  NA 139 137  K 3.9 4.1  CL 106 104  CO2 25 27  GLUCOSE 122* 106*  BUN 13 15  CREATININE 0.96 0.75  CALCIUM 8.3* 9.2    COAG Lab Results  Component Value Date   INR 1.16 04/30/2018   INR 1.33 04/29/2018   INR 1.47 04/29/2018   No results found for: PTT

## 2018-05-02 NOTE — Progress Notes (Signed)
  Speech Language Pathology Treatment: Dysphagia  Patient Details Name: Leah Mata MRN: [id number] DOB: 05-13-1945 Today's Date: 05/02/2018 Time: 3710-6269 SLP Time Calculation (min) (ACUTE ONLY): 13 min  Assessment / Plan / Recommendation Clinical Impression  Pt has a frequent, baseline cough that complicates clinical assessment of PO tolerance, although her cough does not necessarily change in frequency or character. She and her family believe that her cough has gotten progressive stronger since extubation (now three days ago) and that her voice is at baseline (which is strong and clear). We discussed the limitations of a clinical evaluation with such persistent baseline coughing. At this time, they would like to proceed with clear liquid diet as already initiated by MD with careful monitoring. SLP reviewed signs of intolerance for which to monitor. SLP will f/u to assess for tolerance versus need for instrumental testing should there be any other signs of dysphagia noted.   HPI HPI: Pt is a 73 yo female who presented with abdominal and right flank pain. 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 S/p aneurysm abdominal aortic repair with post-op ileus. ETT 1/31-2/1. PMH: COPD, HTN, HLD      SLP Plan  Continue with current plan of care       Recommendations  Diet recommendations: Thin liquid(advance per MD as medically ready) Medication Administration: Whole with puree Supervision: Patient able to self feed;Intermittent supervision to cue for compensatory strategies Compensations: Slow rate;Small sips/bites Postural Changes and/or Swallow Maneuvers: Seated upright 90 degrees                Oral Care Recommendations: Oral care BID Follow up Recommendations: (tba) SLP Visit Diagnosis: Dysphagia, unspecified (R13.10) Plan: Continue with current plan of care       GO                Virl Axe Tarren Velardi 05/02/2018, 10:33 AM  Maxcine Ham Lorilee Cafarella, M.A. CCC-SLP Acute Herbalist [phone] Office [phone]

## 2018-05-02 NOTE — Care Management Note (Addendum)
Case Management Note  Patient Details  Name: Leah Mata MRN: [id number] Date of Birth: Jun 02, 1945  Subjective/Objective:  73 yo female presented with abdominal and right flank pain; s/p AAA repair with post-op ileus.                   Action/Plan: CM met with patient to discuss dispositional needs. Patient states living at home with her son/grandson and being independent with her ADLs PTA. PCP verified as: Dr. Cathlean Cower; pharmacy of choice: [facility] or [facility]. PT/OT evals are complete with HHPT/OT with supervision for mobility and DME recommended; Patient indicated that her son works and her grandson attends school, with neither available to provide assistance during the day. CM discussed [facility] vs ST SNF options, with patient requesting ST SNF for a few weeks of rehab. CM will consult the CSW to discuss ST SNF placement options. CM team will continue to follow.   Expected Discharge Date:                  Expected Discharge Plan:  [place]  In-House Referral:  Clinical Social Work  Discharge planning Services  CM Consult  Post Acute Care Choice:  NA Choice offered to:  NA  DME Arranged:  N/A DME Agency:  NA  HH Arranged:  NA HH Agency:  NA  Status of Service:  In process, will continue to follow  If discussed at Long Length of Stay Meetings, dates discussed:    Additional Comments: 05/05/18 @ 1233-[name] RNCM- CM continue following for dispositional needs. Patient has been followed by SW for ST SNF, with CIR now being recommended by PT/OT. IP Rehab admission screen ordered in Epic to evaluate patients appropriateness for CIR. If CIR is recommended by the Rehab [facility], patient will then require an IP Rehab order. CM will continue to follow.   Midge Minium RN, BSN, NCM-BC, ACM-RN [phone] 05/02/2018, 12:53 PM

## 2018-05-02 NOTE — Progress Notes (Signed)
Physical Therapy Treatment Patient Details Name: Leah Mata MRN: [id number] DOB: Jul 03, 1945 Today's Date: 05/02/2018    History of Present Illness Pt is a 73 y.o. F with significant PMH of COPD who presents with abdominal and right flank pain. 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 S/p aneurysm abdominal aortic repair.    PT Comments    Patient seen for activity progression. Attempted hallway ambulation with several rest breaks and limited activity tolerance.  Patient with vital changes with exertion including desaturations into the upper 70s on 6 liters with limited ambulation, increased to 8 liters and seated rest break, RR upper 40 (47 at peak). 4 standing rest breaks with cues for pursed lip breathing. Increased HR to 146 at highest, 120 at rest. Nsg notified. BP 163/102 upon completion of activity. Patient cued for relaxation and controlled breathing, left patient with saturations 89% on 6 liters after 7 minutes of seated rest. Nursing notified of vitals response.  At this time, feel patient may be better suited for ST post acute rehabilitation to maximize functional recovery and improve activity tolerance. Recommendations updated to ST SNF.  Follow Up Recommendations  SNF     Equipment Recommendations  Rolling walker with 5" wheels;3in1 (PT)    Recommendations for Other Services       Precautions / Restrictions Precautions Precautions: Fall Restrictions Weight Bearing Restrictions: No    Mobility  Bed Mobility               General bed mobility comments: received in recliner  Transfers Overall transfer level: Needs assistance Equipment used: Ambulation equipment used(eva walker) Transfers: Sit to/from Stand;Stand Pivot Transfers Sit to Stand: Mod assist;+2 safety/equipment         General transfer comment: Moderate assist to power up with use of chuck pad. VCs for positioning at edge of  chair  Ambulation/Gait Ambulation/Gait assistance: Min assist Gait Distance (Feet): 80 Feet Assistive device: (eva walker) Gait Pattern/deviations: Step-through pattern;Decreased stride length;Shuffle;Drifts right/left Gait velocity: decreased Gait velocity interpretation: <1.31 ft/sec, indicative of household ambulator General Gait Details: patient with significant deficits in activity tolerance, desaturations into the upper 70s on 6 liters with limited ambulation, increased to 8 liters and seated rest break. 4 standing rest breaks with cues for pursed lip breathing. Increased HR to 146 at highest, 120 at rest. Nsg notified.    Stairs             Wheelchair Mobility    Modified Rankin (Stroke Patients Only)       Balance Overall balance assessment: Needs assistance Sitting-balance support: No upper extremity supported;Feet supported Sitting balance-[name]: Fair     Standing balance support: Bilateral upper extremity supported Standing balance-[name]: Poor Standing balance comment: posterior lean                            Cognition Arousal/Alertness: Awake/alert Behavior During Therapy: Flat affect Overall Cognitive Status: Within Functional Limits for tasks assessed                                        Exercises      General Comments        Pertinent Vitals/Pain Pain Assessment: Faces Pain Score: 10-Worst pain ever Faces Pain Scale: Hurts whole lot Pain Location: abdominal incision Pain Descriptors /  Indicators: Grimacing;Guarding Pain Intervention(s): Monitored during session    Home Living                      Prior Function            PT Goals (current goals can now be found in the care plan section) Acute Rehab PT Goals Patient Stated Goal: "Go home" PT Goal Formulation: With patient Time For Goal Achievement: 05/14/18 Potential to Achieve Goals: Good Progress towards PT goals: Progressing  toward goals(modest progression)    Frequency    Min 3X/week      PT Plan Discharge plan needs to be updated    Co-evaluation              AM-PAC PT "6 Clicks" Mobility   Outcome Measure  Help needed turning from your back to your side while in a flat bed without using bedrails?: A Lot Help needed moving from lying on your back to sitting on the side of a flat bed without using bedrails?: A Lot Help needed moving to and from a bed to a chair (including a wheelchair)?: A Little Help needed standing up from a chair using your arms (e.g., wheelchair or bedside chair)?: A Lot Help needed to walk in hospital room?: A Little Help needed climbing 3-5 steps with a railing? : A Lot 6 Click Score: 14    End of Session Equipment Utilized During Treatment: Oxygen Activity Tolerance: Patient tolerated treatment well Patient left: in chair;with call bell/phone within reach;with chair alarm set Nurse Communication: Mobility status PT Visit Diagnosis: Unsteadiness on feet (R26.81);Pain;Difficulty in walking, not elsewhere classified (R26.2) Pain - part of body: (abdomen)     Time: [id number] PT Time Calculation (min) (ACUTE ONLY): 25 min  Charges:  $Gait Training: 8-22 mins $Therapeutic Activity: 8-22 mins                     Charlotte Crumbevon Asberry Lascola, PT DPT  Board Certified Neurologic Specialist Acute Rehabilitation Services Pager [phone] Office [phone]    Fabio AsaDevon J Jeaneen Cala 05/02/2018, 3:19 PM

## 2018-05-02 NOTE — Progress Notes (Signed)
eLink Physician-Brief Progress Note Patient Name: Leah Mata DOB: November 23, 1945 MRN: [id number]   Date of Service  05/02/2018  HPI/Events of Note  Elevated blood pressure and shortness of breath, CXR consistent with pulmonary edema. Pt had recent AAA repair.  eICU Interventions  Norvasc 5 mg po now then change Norvasc to 10 mg po daily, Lasix 40 mg iv Q 12 hours x 4 doses, prn Hydralazine orders changed for more effective BP control. Will consider BIPAP transiently if pulmonary edema does not rapidly improve with more aggressive diuresis. Will check BMET at midnight to track K+        Okoronkwo U Ogan 05/02/2018, 9:06 PM

## 2018-05-02 NOTE — Progress Notes (Signed)
eLink Physician-Brief Progress Note Patient Name: Leah Mata DOB: 1945/06/23 MRN: [id number]   Date of Service  05/02/2018  HPI/Events of Note  Needs close monitoring of UOP with Lasix doses  eICU Interventions  Order to place foley catheter         [name] 05/02/2018, 10:02 PM

## 2018-05-03 ENCOUNTER — Inpatient Hospital Stay (HOSPITAL_COMMUNITY): Payer: Medicare PPO

## 2018-05-03 ENCOUNTER — Inpatient Hospital Stay: Payer: Self-pay

## 2018-05-03 DIAGNOSIS — R9431 Abnormal electrocardiogram [ECG] [EKG]: Secondary | ICD-10-CM

## 2018-05-03 DIAGNOSIS — J90 Pleural effusion, not elsewhere classified: Secondary | ICD-10-CM

## 2018-05-03 DIAGNOSIS — J9601 Acute respiratory failure with hypoxia: Secondary | ICD-10-CM

## 2018-05-03 LAB — BASIC METABOLIC PANEL
Anion gap: 10 (ref 5–15)
Anion gap: 9 (ref 5–15)
BUN: 15 mg/dL (ref 8–23)
BUN: 17 mg/dL (ref 8–23)
CALCIUM: 9.7 mg/dL (ref 8.9–10.3)
CALCIUM: 9.5 mg/dL (ref 8.9–10.3)
CO2: 26 mmol/L (ref 22–32)
CO2: 26 mmol/L (ref 22–32)
Chloride: 101 mmol/L (ref 98–111)
Chloride: 103 mmol/L (ref 98–111)
Creatinine, Ser: 0.78 mg/dL (ref 0.44–1.00)
Creatinine, Ser: 0.76 mg/dL (ref 0.44–1.00)
GFR calc Af Amer: >60 mL/min (ref >60)
GFR calc Af Amer: >60 mL/min (ref >60)
GFR calc non Af Amer: >60 mL/min (ref >60)
GFR calc non Af Amer: >60 mL/min (ref >60)
GLUCOSE: 120 mg/dL — AB (ref 70–99)
Glucose, Bld: 116 mg/dL — ABNORMAL HIGH (ref 70–99)
Potassium: 3.6 mmol/L (ref 3.5–5.1)
Potassium: 3.7 mmol/L (ref 3.5–5.1)
Sodium: 137 mmol/L (ref 135–145)
Sodium: 138 mmol/L (ref 135–145)

## 2018-05-03 LAB — ECHOCARDIOGRAM COMPLETE
Height: 64 in
Weight: 2959.46 oz

## 2018-05-03 LAB — BRAIN NATRIURETIC PEPTIDE: B Natriuretic Peptide: 275 pg/mL — ABNORMAL HIGH (ref 0.0–100.0)

## 2018-05-03 LAB — PROCALCITONIN: Procalcitonin: 0.15 ng/mL

## 2018-05-03 MED ORDER — AMIODARONE HCL IN DEXTROSE 360-4.14 MG/200ML-% IV SOLN
30.0000 mg/h | INTRAVENOUS | Status: DC
  Administered 2018-05-04 – 2018-05-13 (×19): 30 mg/h via INTRAVENOUS
  Filled 2018-05-03 (×21): qty 200

## 2018-05-03 MED ORDER — FUROSEMIDE 10 MG/ML IJ SOLN
40.0000 mg | Freq: Three times a day (TID) | INTRAMUSCULAR | Status: AC
  Administered 2018-05-03 – 2018-05-04 (×3): 40 mg via INTRAVENOUS
  Filled 2018-05-03 (×3): qty 4

## 2018-05-03 MED ORDER — PERFLUTREN LIPID MICROSPHERE
1.0000 mL | INTRAVENOUS | Status: AC | PRN
  Administered 2018-05-03: 4 mL via INTRAVENOUS
  Filled 2018-05-03: qty 10

## 2018-05-03 MED ORDER — METOPROLOL TARTRATE 5 MG/5ML IV SOLN
5.0000 mg | Freq: Four times a day (QID) | INTRAVENOUS | Status: DC
  Administered 2018-05-03 – 2018-05-06 (×9): 5 mg via INTRAVENOUS
  Filled 2018-05-03 (×10): qty 5

## 2018-05-03 MED ORDER — AMIODARONE LOAD VIA INFUSION
150.0000 mg | Freq: Once | INTRAVENOUS | Status: AC
  Administered 2018-05-03: 150 mg via INTRAVENOUS
  Filled 2018-05-03: qty 83.34

## 2018-05-03 MED ORDER — PERFLUTREN LIPID MICROSPHERE
INTRAVENOUS | Status: AC
  Administered 2018-05-03: 4 mL via INTRAVENOUS
  Filled 2018-05-03: qty 10

## 2018-05-03 MED ORDER — PANTOPRAZOLE SODIUM 40 MG IV SOLR
40.0000 mg | Freq: Two times a day (BID) | INTRAVENOUS | Status: DC
  Administered 2018-05-03 (×2): 40 mg via INTRAVENOUS
  Filled 2018-05-03 (×3): qty 40

## 2018-05-03 MED ORDER — AMIODARONE IV BOLUS ONLY 150 MG/100ML
150.0000 mg | Freq: Once | INTRAVENOUS | Status: AC
  Administered 2018-05-03: 150 mg via INTRAVENOUS
  Filled 2018-05-03: qty 100

## 2018-05-03 MED ORDER — AMIODARONE HCL IN DEXTROSE 360-4.14 MG/200ML-% IV SOLN
60.0000 mg/h | INTRAVENOUS | Status: AC
  Administered 2018-05-03 – 2018-05-04 (×2): 60 mg/h via INTRAVENOUS
  Filled 2018-05-03 (×2): qty 200

## 2018-05-03 MED ORDER — SODIUM CHLORIDE 0.9 % IV SOLN
1.0000 g | INTRAVENOUS | Status: DC
  Administered 2018-05-03 – 2018-05-08 (×6): 1 g via INTRAVENOUS
  Filled 2018-05-03 (×8): qty 10

## 2018-05-03 MED ORDER — POTASSIUM CHLORIDE CRYS ER 20 MEQ PO TBCR
40.0000 meq | EXTENDED_RELEASE_TABLET | Freq: Once | ORAL | Status: AC
  Administered 2018-05-03: 40 meq via ORAL
  Filled 2018-05-03: qty 2

## 2018-05-03 NOTE — Progress Notes (Signed)
SLP Cancellation Note  Patient Details Name: Leah Mata MRN: [id number] DOB: [birth year]   Cancelled treatment:       Reason Eval/Treat Not Completed: Medical issues which prohibited therapy. Pt currently on BiPAP with clinical decline overnight. Although pt will need to be NPO while on BiPAP, would proceed cautiously with any POs when off BiPAP due to overall decline in respiratory status. SLP will continue to follow for readiness.   Virl Axe Consandra Laske 05/03/2018, 9:31 AM  Maxcine Ham Nicolus Ose, M.A. CCC-SLP Acute Herbalist [phone] Office [phone]

## 2018-05-03 NOTE — Progress Notes (Signed)
  Echocardiogram 2D Echocardiogram has been performed.  Leah Mata 05/03/2018, 4:21 PM

## 2018-05-03 NOTE — Progress Notes (Signed)
Subjective: Interval History: none.. Increased work of breathing but sats around 90% on 2 L nasal cannula.  Was on BiPAP overnight and tolerated this.  Objective: Vital signs in last 24 hours: Temp:  [97.6 F (36.4 C)-99.1 F (37.3 C)] 97.6 F (36.4 C) (02/05 0400) Pulse Rate:  [115-124] 124 (02/05 0802) Resp:  [18-28] 23 (02/05 0802) BP: (122-175)/(68-128) 159/87 (02/05 0802) SpO2:  [88 %-100 %] 95 % (02/05 0802)  Intake/Output from previous day: 02/04 0701 - 02/05 0700 In: 505.7 [P.O.:240; I.V.:215.7; IV Piggyback:50] Out: 1300 [Urine:1300] Intake/Output this shift: No intake/output data recorded.  Abdomen completely soft and nontender.  Incision healing.  2+ dorsalis pedis pulses bilaterally  Lab Results: Recent Labs    05/02/18 0114  WBC 10.3  HGB 12.8  HCT 38.9  PLT 66*   BMET Recent Labs    05/03/18 0025 05/03/18 0408  NA 137 138  K 3.6 3.7  CL 101 103  CO2 26 26  GLUCOSE 120* 116*  BUN 15 17  CREATININE 0.78 0.76  CALCIUM 9.5 9.7    Studies/Results: Ct Abdomen Pelvis W Contrast  Result Date: 04/28/2018 CLINICAL DATA:  Right lower quadrant pain, nausea. EXAM: CT ABDOMEN AND PELVIS WITH CONTRAST TECHNIQUE: Multidetector CT imaging of the abdomen and pelvis was performed using the standard protocol following bolus administration of intravenous contrast. CONTRAST:  100mL ISOVUE-300 IOPAMIDOL (ISOVUE-300) INJECTION 61% COMPARISON:  None. FINDINGS: Lower chest: Heart is normal size. Descending thoracic aorta is heavily calcified. Dependent atelectasis in the lower lobes. No effusions. Hepatobiliary: Scattered hypodensities in the liver, likely small cysts. Gallbladder unremarkable. Pancreas: No focal abnormality or ductal dilatation. Spleen: No focal abnormality.  Normal size. Adrenals/Urinary Tract: No adrenal abnormality. No focal renal abnormality. No stones or hydronephrosis. Urinary bladder is unremarkable. Stomach/Bowel: Stomach, large and small bowel grossly  unremarkable. Vascular/Lymphatic: There is an 8.3 cm infrarenal abdominal aortic aneurysm. Extensive thrombus/plaque noted. In addition, abnormal soft tissue is noted adjacent to the right side of the aneurysm and extending around the right kidney and in the right retroperitoneum compatible with large hematoma, likely related to aortic rupture. No visible active extravasation. Aneurysm in at the aortic bifurcation. Iliac vessels are heavily calcified, normal caliber. No adenopathy. Reproductive: Uterus and adnexa unremarkable.  No mass. Other: Large right retroperitoneal hematoma noted surrounding the right kidney and extending into the upper pelvis. No free fluid or free air. Bilateral inguinal hernias noted containing fat. Musculoskeletal: No acute bony abnormality. IMPRESSION: 8.3 cm infrarenal abdominal aortic aneurysm with large right retroperitoneal hematoma most compatible with aortic rupture. Critical Value/emergent results were called by telephone at the time of interpretation on 04/28/2018 at 6:40 pm to Dr. Benjiman CoreNATHAN PICKERING , who verbally acknowledged these results. Electronically Signed   By: Charlett NoseKevin  Dover M.D.   On: 04/28/2018 18:40   Dg Chest Port 1 View  Result Date: 05/02/2018 CLINICAL DATA:  Shortness of breath EXAM: PORTABLE CHEST 1 VIEW COMPARISON:  04/30/2018 FINDINGS: There is subtly increased bibasilar, right greater than left heterogeneous opacity and pleural effusions. There is mild underlying diffuse interstitial pulmonary opacity. Findings are concerning for worsened edema and effusions. There has been interval removal of an esophagogastric tube and right neck vascular sheath. Unchanged cardiomegaly. The visualized skeletal structures are unremarkable. IMPRESSION: There is subtly increased bibasilar, right greater than left heterogeneous opacity and pleural effusions. There is mild underlying diffuse interstitial pulmonary opacity. Findings are concerning for worsened edema and effusions.  Unchanged cardiomegaly. Electronically Signed   By: Lauralyn PrimesAlex  Bibbey  M.D.   On: 05/02/2018 16:15   Dg Chest Port 1 View  Result Date: 04/30/2018 CLINICAL DATA:  Abdominal aortic aneurysm. EXAM: PORTABLE CHEST 1 VIEW COMPARISON:  Radiograph of April 29, 2018. FINDINGS: Stable cardiomediastinal silhouette. Atherosclerosis of thoracic aorta is noted. Endotracheal tube and Swan-Ganz catheter have been removed. Distal tip of nasogastric tube is seen in the stomach. No pneumothorax is noted. Mild bibasilar atelectasis or infiltrates are noted with small pleural effusions. Bony thorax is unremarkable. IMPRESSION: Mild bibasilar atelectasis or infiltrates are noted with small pleural effusions, right greater than left. Endotracheal tube has been removed. Nasogastric tube is in grossly good position. Electronically Signed   By: Lupita Raider, M.D.   On: 04/30/2018 07:46   Dg Chest Port 1 View  Result Date: 04/29/2018 CLINICAL DATA:  Followup intubated patient. EXAM: PORTABLE CHEST 1 VIEW COMPARISON:  04/29/2018 at 0052 hours FINDINGS: Right internal jugular Swan-Ganz catheter, endotracheal tube and nasal/orogastric tube are stable and well positioned. There are prominent bronchovascular markings. Additional opacity is noted in the lung bases likely a combination of small effusions and atelectasis. This is accentuated by low lung volumes. Findings are without change from the earlier exam. No pneumothorax. IMPRESSION: 1. No significant change from the study obtained earlier today. 2. Support apparatus is well positioned. 3. Prominent bronchovascular markings and persistent lung base opacity consistent with small effusions and atelectasis. No convincing pneumonia or pulmonary edema. Electronically Signed   By: Amie Portland M.D.   On: 04/29/2018 11:56   Dg Chest Port 1 View  Result Date: 04/29/2018 CLINICAL DATA:  Postop abdominal aortic aneurysm stent. EXAM: PORTABLE CHEST 1 VIEW COMPARISON:  12/19/2014 FINDINGS:  Endotracheal tube with tip measuring 4.2 cm above the carina. Enteric tube tip is off the field of view but below the left hemidiaphragm. Right Swan-Ganz catheter with tip over the pulmonary outflow tract. No pneumothorax. Shallow inspiration with linear atelectasis in the lung bases, greater on the left. Probable small left pleural effusion. Heart size and pulmonary vascularity are normal for technique. No focal consolidation in the lungs. Mediastinal contours appear intact. Calcification of the aorta. IMPRESSION: Appliances appear in satisfactory location. Shallow inspiration with atelectasis in the lung bases, greater on the left. Probable small left pleural effusion. Electronically Signed   By: Burman Nieves M.D.   On: 04/29/2018 01:16   Anti-infectives: Anti-infectives (From admission, onward)   Start     Dose/Rate Route Frequency Ordered Stop   04/29/18 0400  ceFAZolin (ANCEF) IVPB 2g/100 mL premix     2 g 200 mL/hr over 30 Minutes Intravenous Every 8 hours 04/29/18 0044 04/29/18 1259      Assessment/Plan: s/p Procedure(s): ANEURYSM ABDOMINAL AORTIC REPAIR WITH HEMASHIELD GOLD VASCULAR GRAFT 22X11 mm (N/A) Patient has had returning of bowel function with flatus and tolerating liquids with no nausea or vomiting.  Main issue remains pulmonary status.  Appreciate critical care medicine's assistance.  Attempting diuresis.  Discussed with the patient and her son present that this may deteriorate until needing reintubation but certainly not at that level now.  Hopefully this can be avoided.  Also sinus tach with occasional runs that appear to be A. fib.  Continue to monitor.   LOS: 5 days   Rosalva Neary 05/03/2018, 8:12 AM

## 2018-05-03 NOTE — Progress Notes (Signed)
Bedside US >> no significant effusion on right, Appears to be consolidated lung Will start ceftriaxone for presumed aspiration  Almendra Loria V. Vassie Loll MD

## 2018-05-03 NOTE — Progress Notes (Signed)
Patient ID: Leah Mata, female   DOB: 01/19/1946, 73 y.o.   MRN: [id number] Sleeping currently with BiPAP Now in A. fib with rapid ventricular response in the 130s. Amiodarone initiated with critical care medicine. Ultrasound and echo results noted with presumed pneumonia Blood pressure 90-100 systolic with her rapid rate Continue current support

## 2018-05-03 NOTE — Progress Notes (Signed)
OT Cancellation Note  Patient Details Name: TONI SCHNIPKE MRN: [id number] DOB: 1945/10/12   Cancelled Treatment:    Reason Eval/Treat Not Completed: Medical issues which prohibited therapy(Pt in afib, requiring bipap last night. RN asking to hold.)  Evern Bio 05/03/2018, 8:23 AM  Martie Round, OTR/L Acute Rehabilitation Services Pager: [phone] Office: [phone]

## 2018-05-03 NOTE — Clinical Social Work Note (Signed)
Clinical Social Work Assessment  Patient Details  Name: Leah Mata MRN: [id number] Date of Birth: 1945/12/13  Date of referral:  05/03/18               Reason for consult:  Facility Placement                Permission sought to share information with:  Family Supports Permission granted to share information::     Name::     Associate Professor::     Relationship::  Son  Solicitor Information:     Housing/Transportation Living arrangements for the past 2 months:  Single Family Home Source of Information:  Patient, Adult Children Patient Interpreter Needed:  None Criminal Activity/Legal Involvement Pertinent to Current Situation/Hospitalization:  No - Comment as needed Significant Relationships:  Adult Children Lives with:  Adult Children Do you feel safe going back to the place where you live?  No Need for family participation in patient care:  No (Coment)  Care giving concerns:  Pt is alert and oriented. Pt's son and daughter in law present at bedside.   Social Worker assessment / plan: CSW spoke with pt and pt's family at bedside. Pt was on Bipap. Pt would talk through mask. Pt lives with adult son. Pt is agreeable to SNF and was inquiring about [facility]. Pt lives in [place]. CSW provided list at bedside. Pt and pt's family to look over list. CSW to follow up in AM.  Employment status:  Retired Database administrator PT Recommendations:  Skilled Nursing Facility Information / Referral to community resources:  Skilled Nursing Facility  Patient/Family's Response to care:  Pt verbalized understanding of CSW role and expressed appreciation for support. Pt denies any concern regarding pt care at this time.   Patient/Family's Understanding of and Emotional Response to Diagnosis, Current Treatment, and Prognosis:  Pt understanding and realistic regarding physical limitations. Pt understands the need for SNF placement at d/c. Pt agreeable to SNF placement at d/c, at  this time. Pt's responses emotionally appropriate during conversation with CSW. Pt denies any concern regarding treatment plan at this time. CSW will continue to provide support and facilitate d/c needs.   Emotional Assessment Appearance:  Appears stated age Attitude/Demeanor/Rapport:  (pt was appropriate) Affect (typically observed):  Accepting, Appropriate, Calm Orientation:  Oriented to Self, Oriented to Place, Oriented to  Time, Oriented to Situation Alcohol / Substance use:  Not Applicable Psych involvement (Current and /or in the community):  No (Comment)  Discharge Needs  Concerns to be addressed:  Basic Needs, Care Coordination Readmission within the last 30 days:  No Current discharge risk:  Dependent with Mobility Barriers to Discharge:  Continued Medical Work up   [employer], LCSW 05/03/2018, 12:31 PM

## 2018-05-03 NOTE — Progress Notes (Signed)
  Progress Note   Date: 05/03/2018  Patient Name: Leah Mata        MRN#: [id number]   The diagnosis of aaa   CDI query: Patient had hemorrhagic shock ruled in due to ruptured abdominal aortic aneurysm requiring emergent surgery    Gretta Began, MD

## 2018-05-03 NOTE — Progress Notes (Signed)
eLink Physician-Brief Progress Note Patient Name: Leah Mata DOB: 13-Nov-1945 MRN: [id number]   Date of Service  05/03/2018  HPI/Events of Note  Afib with RVR which is new onset. Baseline EKG shows sinus rhythm. Patient with normal LV function on echo today. K+ 3.7 the last time it was checked.  eICU Interventions  Amiodarone 150 mg iv x 1, KCL 40 meq po x 1, check BMET and Mg++        [name] 05/03/2018, 7:47 PM

## 2018-05-03 NOTE — Progress Notes (Signed)
NAME:  Leah Mata, MRN:  [id number], DOB:  07-29-1945, LOS: 5 ADMISSION DATE:  04/28/2018, CONSULTATION DATE:  04/29/2018 REFERRING MD:  Dr. Arbie Mata, CHIEF COMPLAINT:  AAA  Brief History   [name] presenting with acute onset of RLQ pain w/N/V and radiation to back with hypotension found to have ruptured AAA.  Taken to emergently to OR  Past Medical History  Current smoker, COPD, HTN, and HLD   Significant Hospital Events   1/31 Admit-> OR 2/1 extubated 2/2: Pulmonary signed off.  Recommending aggressive pulmonary hygiene measures 2/4:Developing worsening shortness of breath had worsening blood pressure, worsening pulmonary edema and right greater than left pleural effusions.  Blood pressure management escalated, diuretics ordered, placed on BiPAP. 2/5: Pulmonary formally reconsulted, diuretics continued, beta-blockade added given tachycardia, ordered echocardiogram. Consults:   Procedures:  1/31 OR-> AAA repair 1/31 ETT >> 2/1  Significant Diagnostic Tests:  1/31 >> CT abd/ pelvis >>8.3 cm infrarenal abdominal aortic aneurysm with large right retroperitoneal hematoma most compatible with aortic rupture. 2/5: Echocardiogram>>> Micro Data:   Antimicrobials:  1/31 Cefazolin preop  Interim history/subjective:     Objective   Blood pressure (Abnormal) 159/87, pulse (Abnormal) 124, temperature 97.7 F (36.5 C), temperature source Oral, resp. rate (Abnormal) 23, height 5\' 4"  (1.626 m), weight 83.9 kg, last menstrual period 03/29/1993, SpO2 95 %.        Intake/Output Summary (Last 24 hours) at 05/03/2018 9937 Last data filed at 05/03/2018 0600 Gross per 24 hour  Intake 505.67 ml  Output 1300 ml  Net -794.33 ml   Filed Weights   04/28/18 1603 05/01/18 0600  Weight: 69.9 kg 83.9 kg   Examination: General: This is a pleasant 73 year old female she is currently lying in bed.  She is exhibiting increased work of breathing with accessory use HEENT: Upper airway wheezes audible.   Mucous membranes moist, no clear jugular venous distention Pulmonary: As mentioned upper airway wheezing, some scattered rhonchi, as well as lower airway wheezing.  Accessory use is appreciated Cardiac: Rapid regular at times irregular rhythm.  Appears primarily sinus tachycardia, however intermittently appears to convert to A. Fib Abdomen: Not tender no organomegaly Extremities: Trace lower extremity edema strong pulses warm Neuro: Awake oriented no focal deficits she is a little anxious GU clear yellow  Resolved Hospital Problem list   Acute blood loss anemia Acute kidney injury  Assessment & Plan:   Acute on chronic hypoxic respiratory failure In the setting of worsening pulmonary edema and right greater than left pleural effusions superimposed on presumptive diagnosis of underlying obstructive lung disease. Portable chest x-ray personally reviewed: Shows right greater than left pleural effusion/pulmonary edema -Required noninvasive positive pressure ventilation last night on 2/4 now on 2 L -Remains 9.6 L positive Plan Continuing supplemental oxygen BiPAP PRN Control blood pressure Continuing aggressive diuretics Continuing bronchodilators Repeat chest x-ray 2/6, may require thoracentesis Trending fever and white blood cell curve, ck PCT and BNP Changing H2B to PPI BID Reflux precautions  Hypertension with ongoing sinus tachycardia. Chest x-ray earlier worrisome for pulmonary edema, in setting of hypertension raising concern for diastolic dysfunction Plan diuretics at every 12 dose interval for now Apresoline as needed for systolic blood pressure greater than 160 Continuing lisinopril 20 mg daily Norvasc dosing increased to 10 mg daily She has ongoing tachycardia, will add beta-blockade Echocardiogram today Repeat 12-lead  Ruptured infrarenal AAA s/p open repair with EBL ~4L Plan Postop interventions per vascular surgery BP control per above  At risk for fluid  and  electrolyte imbalance Plan Trending chemistries with diuretics Strict intake output  Acute blood loss anemia w/ ebl 4.2 liters.  -Hemoglobin is remained stable over the last 72 hours Plan Trend CBC  Hyperglycemia Plan Sliding scale insulin   Best practice:  Diet: reg diet  Pain/Anxiety/Delirium NA VAP protocol (if indicated): dc DVT prophylaxis: heparin SQ GI prophylaxis: protonix Glucose control: SSI  Mobility:OOB Code Status: Full  Family Communication:  Disposition: Clinically declined overnight.  Appears to be element of pulmonary edema, volume overload.  Could consider transfusion related lung injury however appears more right greater than left focused.  Also consider aspiration pneumonia.  Plan for now: Continue aggressive diuretics, checking echocardiogram, adding beta-blockade, pro calcitonin and BNP.  She will require BiPAP PRN for now.  Leah MartinetPeter E Mata ACNP-BC [facility] Pul[facility]/Critical Care Pager # [phone] [place] # [phone] if no answer

## 2018-05-03 NOTE — Progress Notes (Signed)
Pt taken off BiPAP and placed on [place] 4 L/m with Humidity, tolerating well, RN notified

## 2018-05-03 NOTE — Progress Notes (Signed)
eLink Physician-Brief Progress Note Patient Name: Leah Mata DOB: [birth year] MRN: [id number]   Date of Service  05/03/2018  HPI/Events of Note  Afib persists despite one time bolus of 150 mg iv, pt needs central vascular access for drips  eICU Interventions  Amiodarone load and infusion per order set, order entered for PICC line placement, patient has normal renal function        [name] 05/03/2018, 10:08 PM

## 2018-05-03 NOTE — Progress Notes (Signed)
Pt placed on Bipap per CCM request due to increased WOB.  Pt tolerating well at this time. Family at bedside.

## 2018-05-03 NOTE — NC FL2 (Signed)
[place] MEDICAID FL2 LEVEL OF CARE SCREENING TOOL     IDENTIFICATION  Patient Name: Leah Mata Birthdate: 12-07-1945 Sex: female Admission Date (Current Location): 04/28/2018  [facility] and [place] Number:  Producer, television/film/video and Address:  [facility] [place], [address], [place], [place] [address]      Provider Number: [id number]  Attending Physician Name and Address:  Larina Earthly, MD  Relative Name and Phone Number:       Current Level of Care: Hospital Recommended Level of Care: Skilled Nursing Facility Prior Approval Number:    Date Approved/Denied:   PASRR Number:    Discharge Plan: SNF    Current Diagnoses: Patient Active Problem List   Diagnosis Date Noted  . AAA (abdominal aortic aneurysm) (HCC) 04/29/2018  . AAA (abdominal aortic aneurysm) without rupture (HCC) 04/28/2018  . Acute upper respiratory infection 04/06/2018  . Left arm pain 11/17/2017  . Skin lesion of face 09/07/2016  . Allergic rhinitis 09/07/2016  . Microhematuria 03/03/2016  . Hypercalcemia 08/12/2015  . Cough 12/19/2014  . Bilateral hearing loss 12/19/2014  . Hyperlipidemia 08/22/2013  . Smoker 12/01/2012  . COPD exacerbation (HCC) 02/24/2011  . 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 Pre-diabetes 08/19/2010  . Preventative health care 08/19/2010  . INSOMNIA-SLEEP DISORDER-UNSPEC 06/05/2009  . BACK PAIN 03/14/2009  . Essential hypertension 11/01/2006  . COPD (chronic obstructive pulmonary disease) (HCC) 11/01/2006    Orientation RESPIRATION BLADDER Height & Weight     Self, Time, Situation, Place  O2, Other (Comment)(BiPap) Continent Weight: 184 lb 15.5 oz (83.9 kg) Height:  5\' 4"  (162.6 cm)  BEHAVIORAL SYMPTOMS/MOOD NEUROLOGICAL BOWEL NUTRITION STATUS      Incontinent Diet(clear liquid diet, thin liquids, please see d/c summary, diet subject to change)  AMBULATORY STATUS COMMUNICATION OF NEEDS Skin   Limited Assist Verbally Surgical wounds(Closed incision  on Abdomen, guaze dressing, change daily)                       Personal Care Assistance Level of Assistance  Dressing, Feeding, Bathing Bathing Assistance: Limited assistance Feeding assistance: Independent Dressing Assistance: Limited assistance     Functional Limitations Info  Sight, Hearing, Speech Sight Info: Adequate Hearing Info: Adequate Speech Info: Adequate    SPECIAL CARE FACTORS FREQUENCY  PT (By licensed PT), OT (By licensed OT)     PT Frequency: 3x OT Frequency: 3x            Contractures Contractures Info: Not present    Additional Factors Info  Code Status, Allergies Code Status Info: Full Code Allergies Info: Hydrochlorothiazide           Current Medications (05/03/2018):  This is the current hospital active medication list Current Facility-Administered Medications  Medication Dose Route Frequency Provider Last Rate Last Dose  . albuterol (PROVENTIL) (2.5 MG/3ML) 0.083% nebulizer solution 2.5 mg  2.5 mg Nebulization Q3H PRN Rhyne, Samantha J, PA-C   2.5 mg at 04/30/18 0507  . alum & mag hydroxide-simeth (MAALOX/MYLANTA) 200-200-20 MG/5ML suspension 15-30 mL  15-30 mL Oral Q2H PRN Rhyne, Samantha J, PA-C      . amLODipine (NORVASC) tablet 10 mg  10 mg Oral Daily [name], MD   10 mg at 05/03/18 0957  . arformoterol (BROVANA) nebulizer solution 15 mcg  15 mcg Nebulization BID Dara Lords, PA-C   15 mcg at 05/03/18 0801  . bisacodyl (DULCOLAX) suppository 10 mg  10 mg Rectal Daily PRN Rhyne,  Ames Coupe, PA-C      . budesonide (PULMICORT) nebulizer solution 0.25 mg  0.25 mg Nebulization BID Rhyne, Samantha J, PA-C   0.25 mg at 05/03/18 0801  . chlorhexidine (PERIDEX) 0.12 % solution 15 mL  15 mL Mouth Rinse BID Rhyne, Samantha J, PA-C   15 mL at 05/03/18 0957  . docusate sodium (COLACE) capsule 100 mg  100 mg Oral Daily Rhyne, Samantha J, PA-C   100 mg at 05/03/18 0957  . fluticasone (FLONASE) 50 MCG/ACT nasal spray 2 spray  2 spray Each  Nare Daily Rhyne, Samantha J, PA-C      . furosemide (LASIX) injection 40 mg  40 mg Intravenous Q12H [name], MD   40 mg at 05/03/18 0856  . guaiFENesin-dextromethorphan (ROBITUSSIN DM) 100-10 MG/5ML syrup 15 mL  15 mL Oral Q4H PRN Rhyne, Samantha J, PA-C   15 mL at 05/02/18 1303  . heparin injection 5,000 Units  5,000 Units Subcutaneous Q8H Rhyne, Samantha J, PA-C   5,000 Units at 05/03/18 0559  . hydrALAZINE (APRESOLINE) injection 10-20 mg  10-20 mg Intravenous Q4H PRN [name], MD   10 mg at 05/03/18 0416  . ipratropium-albuterol (DUONEB) 0.5-2.5 (3) MG/3ML nebulizer solution 3 mL  3 mL Nebulization Q6H Rhyne, Samantha J, PA-C   3 mL at 05/03/18 0801  . lisinopril (PRINIVIL,ZESTRIL) tablet 20 mg  20 mg Oral Daily Rhyne, Samantha J, PA-C   20 mg at 05/03/18 0957  . magnesium sulfate IVPB 2 g 50 mL  2 g Intravenous Once PRN Rhyne, Samantha J, PA-C      . MEDLINE mouth rinse  15 mL Mouth Rinse q12n4p Rhyne, Samantha J, PA-C   15 mL at 05/02/18 1223  . metoprolol tartrate (LOPRESSOR) injection 2-5 mg  2-5 mg Intravenous Q2H PRN Rhyne, Samantha J, PA-C   5 mg at 05/02/18 1845  . metoprolol tartrate (LOPRESSOR) injection 5 mg  5 mg Intravenous Q6H Simonne Martinet, NP   5 mg at 05/03/18 0957  . morphine 2 MG/ML injection 2 mg  2 mg Intravenous Q2H PRN Rhyne, Ames Coupe, PA-C   2 mg at 05/03/18 0856  . ondansetron (ZOFRAN) injection 4 mg  4 mg Intravenous Q6H PRN Rhyne, Samantha J, PA-C   4 mg at 04/30/18 1411  . pantoprazole (PROTONIX) injection 40 mg  40 mg Intravenous Q12H Simonne Martinet, NP   40 mg at 05/03/18 0957  . phenol (CHLORASEPTIC) mouth spray 1 spray  1 spray Mouth/Throat PRN Rhyne, Samantha J, PA-C      . potassium chloride SA (K-DUR,KLOR-CON) CR tablet 20-40 mEq  20-40 mEq Oral Once PRN Rhyne, Samantha J, PA-C      . pravastatin (PRAVACHOL) tablet 20 mg  20 mg Oral q1800 Rhyne, Samantha J, PA-C      . sodium chloride flush (NS) 0.9 % injection 10-40 mL  10-40 mL  Intracatheter Q12H Rhyne, Samantha J, PA-C   10 mL at 05/02/18 2135  . sodium chloride flush (NS) 0.9 % injection 10-40 mL  10-40 mL Intracatheter PRN Dara Lords, PA-C         Discharge Medications: Please see discharge summary for a list of discharge medications.  Relevant Imaging Results:  Relevant Lab Results:   Additional Information SSN: [id number]  Maree Krabbe, LCSW

## 2018-05-04 ENCOUNTER — Inpatient Hospital Stay (HOSPITAL_COMMUNITY): Payer: Medicare PPO

## 2018-05-04 LAB — COMPREHENSIVE METABOLIC PANEL
ALT: 26 U/L (ref 0–44)
AST: 28 U/L (ref 15–41)
Albumin: 2.4 g/dL — ABNORMAL LOW (ref 3.5–5.0)
Alkaline Phosphatase: 58 U/L (ref 38–126)
Anion gap: 11 (ref 5–15)
BUN: 36 mg/dL — AB (ref 8–23)
CO2: 25 mmol/L (ref 22–32)
Calcium: 9.5 mg/dL (ref 8.9–10.3)
Chloride: 102 mmol/L (ref 98–111)
Creatinine, Ser: 1.22 mg/dL — ABNORMAL HIGH (ref 0.44–1.00)
GFR calc Af Amer: 52 mL/min — ABNORMAL LOW (ref >60)
GFR calc non Af Amer: 45 mL/min — ABNORMAL LOW (ref >60)
Glucose, Bld: 119 mg/dL — ABNORMAL HIGH (ref 70–99)
Potassium: 4.3 mmol/L (ref 3.5–5.1)
Sodium: 138 mmol/L (ref 135–145)
Total Bilirubin: 2.6 mg/dL — ABNORMAL HIGH (ref 0.3–1.2)
Total Protein: 5.5 g/dL — ABNORMAL LOW (ref 6.5–8.1)

## 2018-05-04 LAB — CBC
HCT: 41.8 % (ref 36.0–46.0)
Hemoglobin: 13.4 g/dL (ref 12.0–15.0)
MCH: 28.8 pg (ref 26.0–34.0)
MCHC: 32.1 g/dL (ref 30.0–36.0)
MCV: 89.7 fL (ref 80.0–100.0)
Platelets: 179 10*3/uL (ref 150–400)
RBC: 4.66 MIL/uL (ref 3.87–5.11)
RDW: 15.1 % (ref 11.5–15.5)
WBC: 12.8 10*3/uL — ABNORMAL HIGH (ref 4.0–10.5)
nRBC: 0 % (ref 0.0–0.2)

## 2018-05-04 MED ORDER — SENNOSIDES-DOCUSATE SODIUM 8.6-50 MG PO TABS
2.0000 | ORAL_TABLET | Freq: Every day | ORAL | Status: DC
  Administered 2018-05-04 – 2018-05-10 (×4): 2 via ORAL
  Filled 2018-05-04 (×6): qty 2

## 2018-05-04 MED ORDER — SODIUM CHLORIDE 0.9 % IV SOLN
INTRAVENOUS | Status: DC | PRN
  Administered 2018-05-04: 500 mL via INTRAVENOUS
  Administered 2018-05-13 – 2018-05-14 (×2): 250 mL via INTRAVENOUS

## 2018-05-04 MED ORDER — PANTOPRAZOLE SODIUM 40 MG PO TBEC
40.0000 mg | DELAYED_RELEASE_TABLET | Freq: Two times a day (BID) | ORAL | Status: DC
  Administered 2018-05-04 – 2018-05-05 (×3): 40 mg via ORAL
  Filled 2018-05-04 (×3): qty 1

## 2018-05-04 MED ORDER — POLYETHYLENE GLYCOL 3350 17 G PO PACK
17.0000 g | PACK | Freq: Every day | ORAL | Status: DC
  Administered 2018-05-04 – 2018-05-11 (×5): 17 g via ORAL
  Filled 2018-05-04 (×5): qty 1

## 2018-05-04 NOTE — Progress Notes (Signed)
Pt receiving amio gtt via PIV. PICC line requested & ordered obtained however PICC lines only placed during day shift per IV team. Pharmacy consulted and okay to administer amio via PIV. Left arm PIV looks intact.    Will continue to monitor.

## 2018-05-04 NOTE — Progress Notes (Signed)
Rehab Admissions Coordinator Note:  Patient was screened by Trish Mage for appropriateness for an Inpatient Acute Rehab Consult.  At this time, we are recommending Inpatient Rehab consult.  Lelon Frohlich M 05/04/2018, 12:54 PM  I can be reached at [phone].

## 2018-05-04 NOTE — Progress Notes (Signed)
NAME:  Leah Mata, MRN:  [id number], DOB:  09/06/1945, LOS: 6 ADMISSION DATE:  04/28/2018, CONSULTATION DATE:  04/29/2018 REFERRING MD:  Dr. Arbie Cookey, CHIEF COMPLAINT:  AAA  Brief History   55 yoF presenting with acute onset of RLQ pain w/N/V and radiation to back with hypotension found to have ruptured AAA.  Taken to emergently to OR  Past Medical History  Current smoker, COPD, HTN, and HLD   Significant Hospital Events   1/31 Admit-> OR 2/1 extubated 2/2: Pulmonary signed off.  Recommending aggressive pulmonary hygiene measures 2/4:Developing worsening shortness of breath had worsening blood pressure, worsening pulmonary edema and right greater than left pleural effusions.  Blood pressure management escalated, diuretics ordered, placed on BiPAP. 2/5: Pulmonary formally reconsulted, diuretics continued, beta-blockade added given tachycardia, ordered echocardiogram.  Had atrial fibrillation with rapid ventricular response ultimately ended up on amiodarone infusion.  Ultrasound of right chest demonstrated more consolidation and very minimal effusion, started on ceftriaxone for possible aspiration pneumonia 2/6: Chest x-ray clearing.  Oxygen requirements seem to be improving.  Reporting constipation, addressing this with MiraLAX and laxative of choice as well as stool softener.  Holding diuretics due to creatinine bump.  Doppler review shows diastolic dysfunction. Consults:   Procedures:  1/31 OR-> AAA repair 1/31 ETT >> 2/1  Significant Diagnostic Tests:  1/31 >> CT abd/ pelvis >>8.3 cm infrarenal abdominal aortic aneurysm with large right retroperitoneal hematoma most compatible with aortic rupture. 2/5: Echocardiogram>>> hyperdynamic left ventricle EF greater than 65% abnormal diastolic relaxation no wall motion abnormality Micro Data:   Antimicrobials:  1/31 Cefazolin preop 2/5: Ceftriaxone Interim history/subjective:  Feels about the same wants to eat and drink   Objective     Blood pressure 133/67, pulse 84, temperature (Abnormal) 97.4 F (36.3 C), temperature source Axillary, resp. rate (Abnormal) 24, height 5\' 4"  (1.626 m), weight 83.9 kg, last menstrual period 03/29/1993, SpO2 94 %.    FiO2 (%):  [40 %-50 %] 50 %   Intake/Output Summary (Last 24 hours) at 05/04/2018 0912 Last data filed at 05/04/2018 0700 Gross per 24 hour  Intake 554.48 ml  Output 1590 ml  Net -1035.52 ml   Filed Weights   04/28/18 1603 05/01/18 0600  Weight: 69.9 kg 83.9 kg   Examination: General: This is a very pleasant 73 year old female she is currently resting comfortably on noninvasive positive pressure ventilation HEENT normocephalic atraumatic no jugular venous distention Pulmonary: Upper airway wheezing persists, some occasional rhonchi diminished right base Cardiac: Atrial fibrillation on telemetry now with controlled ventricular rate Abdomen: Soft, denies tenderness positive bowel sounds are present wants to drink GU: Clear yellow urine Neuro: Awake oriented no focal deficits Extremities: Warm and dry  Resolved Hospital Problem list   Acute blood loss anemia Acute kidney injury  Assessment & Plan:   Acute on chronic hypoxic respiratory failure In the setting of presumed aspiration pneumonia (no organism specified), complicated by element of volume overload with small pleural effusion Possible underlying obstructive lung disease Portable chest x-ray personally reviewed today on 2/6: This demonstrates improved aeration particularly in the right base continues to have right basilar airspace disease but aeration has improved Plan Change BiPAP to PRN  Wean oxygen for saturations greater than 92%  Keep euvolemic at this point  Continue bronchodilators Day #2 ceftriaxone, planning on 7 days for possible aspiration  Continued pulse oximetry  PPI twice daily  Reflux precautions  Treat constipation A.m. chest x-ray  Mobilize  Add flutter valve and spirometry  Atrial  fibrillation with rapid ventricular response w/ CHAD2VASC of 5  Plan Continuing amiodarone We will discuss with surgical services, given her stroke risk need to consider anticoagulation  Hypertension with diastolic dysfunction by echocardiogram on 2/5 -Blood pressure now better controlled -Left ventricle exhibits hyperdynamic systolic function greater than 65%.  The cavity size is normal.  There is evidence of impaired diastolic relaxation.  RV and RA is normal sinus Plan Continuing Norvasc Continuing lisinopril 20 mg daily Hydralazine as needed   Ruptured infrarenal AAA s/p open repair with EBL ~4L Plan Postoperative management per vascular surgery BP control  At risk for fluid and electrolyte imbalance: With mild AKI following attempted increase in diuretics on 2/5 Plan Hold further diuretics today  Repeat a.m. chemistry   Constipation: Plan Starting MiraLAX, laxative of choice, and stool softener  acute blood loss anemia w/ ebl 4.2 liters.  -Hemoglobin is remained stable over the last 72 hours Plan Trend CBC  Best practice:  Diet: reg diet  Pain/Anxiety/Delirium NA VAP protocol (if indicated): dc DVT prophylaxis: heparin SQ GI prophylaxis: protonix Glucose control: SSI  Mobility:OOB Code Status: Full  Family Communication:  Disposition: Looking better.  Try to wean off noninvasive today and changed to simply PRN.  Atrial fibrillation now controlled.  Treating for aspiration.  Simonne Martinet ACNP-BC [facility] Pulmonary/Critical Care Pager # [phone] OR # [phone] if no answer

## 2018-05-04 NOTE — Progress Notes (Signed)
05/04/18 1400  PT Visit Information  Last PT Received On 05/04/18  Assistance Needed +2 (ambulation/lines)  PT/OT/SLP Co-Evaluation/Treatment Yes  Reason for Co-Treatment Complexity of the patient's impairments (multi-system involvement)  PT goals addressed during session Mobility/safety with mobility  History of Present Illness Pt is a 73 y.o. F with significant PMH of COPD who presents with abdominal and right flank pain. 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 S/p aneurysm abdominal aortic repair. Hospitalization complicated by worsening SOB requiring bipap on 2/4 due to pulmonary effusion, tachycardia 2/5 requiring amiodorone drip.  Subjective Data  Subjective Agreeable to therapies  Patient Stated Goal "Go home"  Precautions  Precautions Fall  Precaution Comments watch HR and 02, on HFNC  Restrictions  Weight Bearing Restrictions Yes  Pain Assessment  Pain Assessment No/denies pain  Cognition  Arousal/Alertness Awake/alert  Behavior During Therapy Flat affect  Overall Cognitive Status Within Functional Limits for tasks assessed  Bed Mobility  Overal bed mobility Needs Assistance  Bed Mobility Rolling;Sidelying to Sit  Rolling Min assist  Sidelying to sit Min assist  General bed mobility comments pulled up on PT's hand to raise trunk  Transfers  Overall transfer level Needs assistance  Equipment used Ambulation equipment used (eva walker)  Transfers Sit to/from Stand  Sit to Stand Min assist  General transfer comment min assist to rise and steady, cues for hand placement  Ambulation/Gait  Ambulation/Gait assistance Min assist  Gait Distance (Feet) 120 Feet  Assistive device  (eva walker)  Gait Pattern/deviations Step-through pattern;Decreased stride length;Shuffle;Drifts right/left  General Gait Details patient with significant deficits in activity tolerance, Needed 8 liters at rest and 10 L wtih  acitivity to keep sats >90%.  HR 98-128 bpm.  DOE 3/4.  Several  standing rest breaks with cues for pursed lip breathing.  Gait velocity decreased  Gait velocity interpretation <1.31 ft/sec, indicative of household ambulator  Balance  Overall balance assessment Needs assistance  Sitting-balance support No upper extremity supported;Feet supported  Sitting balance-[name] Fair  Standing balance support Bilateral upper extremity supported  Standing balance-[name] Poor  Standing balance comment reliant on B UE support in standing  PT - End of Session  Equipment Utilized During Treatment Oxygen;Gait belt  Activity Tolerance Patient limited by fatigue  Patient left in chair;with call bell/phone within reach;with chair alarm set;with family/visitor present (son came in after pt finished walk)  Nurse Communication Mobility status   PT - Assessment/Plan  PT Plan Discharge plan needs to be updated  PT Visit Diagnosis Unsteadiness on feet (R26.81);Pain;Difficulty in walking, not elsewhere classified (R26.2)  Pain - part of body  (abdomen)  PT Frequency (ACUTE ONLY) Min 3X/week  Follow Up Recommendations CIR  PT equipment Rolling walker with 5" wheels;3in1 (PT)  AM-PAC PT "6 Clicks" Mobility Outcome Measure (Version 2)  Help needed turning from your back to your side while in a flat bed without using bedrails? 2  Help needed moving from lying on your back to sitting on the side of a flat bed without using bedrails? 2  Help needed moving to and from a bed to a chair (including a wheelchair)? 3  Help needed standing up from a chair using your arms (e.g., wheelchair or bedside chair)? 2  Help needed to walk in hospital room? 3  Help needed climbing 3-5 steps with a railing?  2  6 Click Score 14  Consider Recommendation of Discharge To: CIR/SNF/LTACH  PT Goal Progression  Progress towards PT goals Progressing toward goals  PT Time Calculation  PT Start Time (ACUTE ONLY) 1054  PT Stop  Time (ACUTE ONLY) 1115  PT Time Calculation (min) (ACUTE ONLY) 21 min  PT General Charges  $$ ACUTE PT VISIT 1 Visit  PT Treatments  $Gait Training 8-22 mins  Pt was able to incr ambulation distance.  Still requiring 8-10 L of O2, 8 at rest and 10 with activity to keep sats >90%.  Feel that Rehab is a good option for pt.  Will continue to follow. Mariyanna Mucha,PT Acute Rehabilitation Services Pager:  [phone]  Office:  [phone]

## 2018-05-04 NOTE — Progress Notes (Signed)
SLP Cancellation Note  Patient Details Name: CORRETTA SALA MRN: [id number] DOB: 1946/01/20   Cancelled treatment:       Reason Eval/Treat Not Completed: Medical issues which prohibited therapy (on BiPAP)   Virl Axe Mariea Mcmartin 05/04/2018, 8:42 AM  Maxcine Ham Alicya Bena, M.A. CCC-SLP Acute Herbalist [phone] Office [phone]

## 2018-05-04 NOTE — Progress Notes (Signed)
Subjective: Interval History: none.. Alert and oriented this morning.  Tolerating BiPAP.  Objective: Vital signs in last 24 hours: Temp:  [96.8 F (36 C)-98.2 F (36.8 C)] 97.9 F (36.6 C) (02/06 0400) Pulse Rate:  [75-145] 84 (02/06 0744) Resp:  [17-26] 24 (02/06 0744) BP: (76-173)/(57-97) 133/67 (02/06 0744) SpO2:  [89 %-99 %] 94 % (02/06 0744) FiO2 (%):  [40 %-50 %] 50 % (02/06 0744)  Intake/Output from previous day: 02/05 0701 - 02/06 0700 In: 674.5 [P.O.:180; I.V.:394.4; IV Piggyback:100.1] Out: 1590 [Urine:1590] Intake/Output this shift: No intake/output data recorded.  Abdomen soft without tenderness.  Palpable dorsalis pedis pulses bilaterally  Lab Results: Recent Labs    05/02/18 0114 05/04/18 0358  WBC 10.3 12.8*  HGB 12.8 13.4  HCT 38.9 41.8  PLT 66* 179   BMET Recent Labs    05/03/18 0408 05/04/18 0358  NA 138 138  K 3.7 4.3  CL 103 102  CO2 26 25  GLUCOSE 116* 119*  BUN 17 36*  CREATININE 0.76 1.22*  CALCIUM 9.7 9.5    Studies/Results: Ct Abdomen Pelvis W Contrast  Result Date: 04/28/2018 CLINICAL DATA:  Right lower quadrant pain, nausea. EXAM: CT ABDOMEN AND PELVIS WITH CONTRAST TECHNIQUE: Multidetector CT imaging of the abdomen and pelvis was performed using the standard protocol following bolus administration of intravenous contrast. CONTRAST:  ISOVUE-300 IOPAMIDOL (ISOVUE-300) INJECTION 61% COMPARISON:  None. FINDINGS: Lower chest: Heart is normal size. Descending thoracic aorta is heavily calcified. Dependent atelectasis in the lower lobes. No effusions. Hepatobiliary: Scattered hypodensities in the liver, likely small cysts. Gallbladder unremarkable. Pancreas: No focal abnormality or ductal dilatation. Spleen: No focal abnormality.  Normal size. Adrenals/Urinary Tract: No adrenal abnormality. No focal renal abnormality. No stones or hydronephrosis. Urinary bladder is unremarkable. Stomach/Bowel: Stomach, large and small bowel grossly  unremarkable. Vascular/Lymphatic: There is an 8.3 cm infrarenal abdominal aortic aneurysm. Extensive thrombus/plaque noted. In addition, abnormal soft tissue is noted adjacent to the right side of the aneurysm and extending around the right kidney and in the right retroperitoneum compatible with large hematoma, likely related to aortic rupture. No visible active extravasation. Aneurysm in at the aortic bifurcation. Iliac vessels are heavily calcified, normal caliber. No adenopathy. Reproductive: Uterus and adnexa unremarkable.  No mass. Other: Large right retroperitoneal hematoma noted surrounding the right kidney and extending into the upper pelvis. No free fluid or free air. Bilateral inguinal hernias noted containing fat. Musculoskeletal: No acute bony abnormality. IMPRESSION: 8.3 cm infrarenal abdominal aortic aneurysm with large right retroperitoneal hematoma most compatible with aortic rupture. Critical Value/emergent results were called by telephone at the time of interpretation on 04/28/2018 at 6:40 pm to Dr. Benjiman Core , who verbally acknowledged these results. Electronically Signed   By: Charlett Nose M.D.   On: 04/28/2018 18:40   Dg Chest Port 1 View  Result Date: 05/02/2018 CLINICAL DATA:  Shortness of breath EXAM: PORTABLE CHEST 1 VIEW COMPARISON:  04/30/2018 FINDINGS: There is subtly increased bibasilar, right greater than left heterogeneous opacity and pleural effusions. There is mild underlying diffuse interstitial pulmonary opacity. Findings are concerning for worsened edema and effusions. There has been interval removal of an esophagogastric tube and right neck vascular sheath. Unchanged cardiomegaly. The visualized skeletal structures are unremarkable. IMPRESSION: There is subtly increased bibasilar, right greater than left heterogeneous opacity and pleural effusions. There is mild underlying diffuse interstitial pulmonary opacity. Findings are concerning for worsened edema and effusions.  Unchanged cardiomegaly. Electronically Signed   By: Erasmo Score.D.  On: 05/02/2018 16:15   Dg Chest Port 1 View  Result Date: 04/30/2018 CLINICAL DATA:  Abdominal aortic aneurysm. EXAM: PORTABLE CHEST 1 VIEW COMPARISON:  Radiograph of April 29, 2018. FINDINGS: Stable cardiomediastinal silhouette. Atherosclerosis of thoracic aorta is noted. Endotracheal tube and Swan-Ganz catheter have been removed. Distal tip of nasogastric tube is seen in the stomach. No pneumothorax is noted. Mild bibasilar atelectasis or infiltrates are noted with small pleural effusions. Bony thorax is unremarkable. IMPRESSION: Mild bibasilar atelectasis or infiltrates are noted with small pleural effusions, right greater than left. Endotracheal tube has been removed. Nasogastric tube is in grossly good position. Electronically Signed   By: Lupita Raider, M.D.   On: 04/30/2018 07:46   Dg Chest Port 1 View  Result Date: 04/29/2018 CLINICAL DATA:  Followup intubated patient. EXAM: PORTABLE CHEST 1 VIEW COMPARISON:  04/29/2018 at 0052 hours FINDINGS: Right internal jugular Swan-Ganz catheter, endotracheal tube and nasal/orogastric tube are stable and well positioned. There are prominent bronchovascular markings. Additional opacity is noted in the lung bases likely a combination of small effusions and atelectasis. This is accentuated by low lung volumes. Findings are without change from the earlier exam. No pneumothorax. IMPRESSION: 1. No significant change from the study obtained earlier today. 2. Support apparatus is well positioned. 3. Prominent bronchovascular markings and persistent lung base opacity consistent with small effusions and atelectasis. No convincing pneumonia or pulmonary edema. Electronically Signed   By: Amie Portland M.D.   On: 04/29/2018 11:56   Dg Chest Port 1 View  Result Date: 04/29/2018 CLINICAL DATA:  Postop abdominal aortic aneurysm stent. EXAM: PORTABLE CHEST 1 VIEW COMPARISON:  12/19/2014 FINDINGS:  Endotracheal tube with tip measuring 4.2 cm above the carina. Enteric tube tip is off the field of view but below the left hemidiaphragm. Right Swan-Ganz catheter with tip over the pulmonary outflow tract. No pneumothorax. Shallow inspiration with linear atelectasis in the lung bases, greater on the left. Probable small left pleural effusion. Heart size and pulmonary vascularity are normal for technique. No focal consolidation in the lungs. Mediastinal contours appear intact. Calcification of the aorta. IMPRESSION: Appliances appear in satisfactory location. Shallow inspiration with atelectasis in the lung bases, greater on the left. Probable small left pleural effusion. Electronically Signed   By: Burman Nieves M.D.   On: 04/29/2018 01:16   [place] Ekg Site Rite  Result Date: 05/03/2018 If Site Rite image not attached, placement could not be confirmed due to current cardiac rhythm.  Anti-infectives: Anti-infectives (From admission, onward)   Start     Dose/Rate Route Frequency Ordered Stop   05/03/18 1815  cefTRIAXone (ROCEPHIN) 1 g in sodium chloride 0.9 % 100 mL IVPB     1 g 200 mL/hr over 30 Minutes Intravenous Every 24 hours 05/03/18 1728     04/29/18 0400  ceFAZolin (ANCEF) IVPB 2g/100 mL premix     2 g 200 mL/hr over 30 Minutes Intravenous Every 8 hours 04/29/18 0044 04/29/18 1259      Assessment/Plan: s/p Procedure(s): ANEURYSM ABDOMINAL AORTIC REPAIR WITH HEMASHIELD GOLD VASCULAR GRAFT 22X11 mm (N/A) Stable overall.  Platelets back to normal.  Creatinine slightly elevated.  Appreciate pulmonary and critical care assistance.  Now back in normal sinus rhythm with a rate of 70-80   LOS: 6 days   Leah Mata 05/04/2018, 7:55 AM

## 2018-05-04 NOTE — Progress Notes (Signed)
Pt taken off Bipap and placed on a Salter HFNC 8 L, sats 92%, tolerating well at this time. Family and RN at bedside.

## 2018-05-04 NOTE — Progress Notes (Signed)
Occupational Therapy Treatment Patient Details Name: Leah Mata MRN: [id number] DOB: 08/28/1945 Today's Date: 05/04/2018    History of present illness Pt is a 73 y.o. F with significant PMH of COPD who presents with abdominal and right flank pain. 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 S/p aneurysm abdominal aortic repair. Hospitalization complicated by worsening SOB requiring bipap on 2/4 due to pulmonary effusion, tachycardia 2/5 requiring amiodorone drip.   OT comments  Pt with decreased activity tolerance. Requiring min assist for all mobility. Ambulated with eva walker on 10L 02 with Sp02 of 90-91%. Updated discharge recommendation to CIR as pt likely to need more intensive rehab prior to going home.   Follow Up Recommendations  CIR    Equipment Recommendations  Tub/shower seat    Recommendations for Other Services      Precautions / Restrictions Precautions Precautions: Fall Precaution Comments: watch HR and 02, on HFNC       Mobility Bed Mobility Overal bed mobility: Needs Assistance Bed Mobility: Rolling;Sidelying to Sit Rolling: Min assist Sidelying to sit: Min assist       General bed mobility comments: pulled up on PT's hand to raise trunk  Transfers Overall transfer level: Needs assistance Equipment used: Ambulation equipment used(eva walker) Transfers: Sit to/from Stand Sit to Stand: Min assist         General transfer comment: min assist to rise and steady, cues for hand placement    Balance Overall balance assessment: Needs assistance   Sitting balance-[name]: Fair       Standing balance-[name]: Poor Standing balance comment: reliant on B UE support in standing                           ADL either performed or assessed with clinical judgement   ADL Overall ADL's : Needs assistance/impaired                 Upper Body Dressing : Minimal  assistance;Sitting Upper Body Dressing Details (indicate cue type and reason): for front opening gown                 Functional mobility during ADLs: Minimal assistance;Rolling walker;+2 for safety/equipment       Vision       Perception     Praxis      Cognition Arousal/Alertness: Awake/alert Behavior During Therapy: Flat affect Overall Cognitive Status: Within Functional Limits for tasks assessed                                          Exercises     Shoulder Instructions       General Comments      Pertinent Vitals/ Pain       Pain Assessment: No/denies pain  Home Living                                          Prior Functioning/Environment              Frequency  Min 2X/week        Progress Toward Goals  OT Goals(current goals can now be found in the care plan section)  Progress towards OT goals: Progressing  toward goals  Acute Rehab OT Goals Patient Stated Goal: "Go home" OT Goal Formulation: With patient Time For Goal Achievement: 05/14/18 Potential to Achieve Goals: Good  Plan Discharge plan needs to be updated    Co-evaluation    PT/OT/SLP Co-Evaluation/Treatment: Yes Reason for Co-Treatment: Complexity of the patient's impairments (multi-system involvement);For patient/therapist safety   OT goals addressed during session: Strengthening/ROM      AM-PAC OT "6 Clicks" Daily Activity     Outcome Measure   Help from another person eating meals?: None Help from another person taking care of personal grooming?: A Little Help from another person toileting, which includes using toliet, bedpan, or urinal?: A Lot Help from another person bathing (including washing, rinsing, drying)?: A Lot Help from another person to put on and taking off regular upper body clothing?: A Little Help from another person to put on and taking off regular lower body clothing?: A Lot 6 Click Score: 16    End of  Session Equipment Utilized During Treatment: Gait belt;Oxygen;Other (comment)(eva walker)  OT Visit Diagnosis: Unsteadiness on feet (R26.81);Other abnormalities of gait and mobility (R26.89);Muscle weakness (generalized) (M62.81);Pain   Activity Tolerance Patient tolerated treatment well   Patient Left in chair;with call bell/phone within reach;with family/visitor present;with nursing/sitter in room   Nurse Communication Mobility status        Time: 0626-9485 OT Time Calculation (min): 25 min  Charges: OT General Charges $OT Visit: 1 Visit OT Treatments $Therapeutic Activity: 8-22 mins  Martie Round, OTR/L Acute Rehabilitation Services Pager: [phone] Office: [phone]   Evern Bio 05/04/2018, 12:37 PM

## 2018-05-05 ENCOUNTER — Inpatient Hospital Stay (HOSPITAL_COMMUNITY): Payer: Medicare PPO

## 2018-05-05 DIAGNOSIS — I480 Paroxysmal atrial fibrillation: Secondary | ICD-10-CM

## 2018-05-05 MED ORDER — OXYCODONE-ACETAMINOPHEN 5-325 MG PO TABS
1.0000 | ORAL_TABLET | ORAL | Status: DC | PRN
  Administered 2018-05-05 – 2018-05-20 (×6): 2 via ORAL
  Filled 2018-05-05 (×7): qty 2

## 2018-05-05 MED ORDER — FLEET ENEMA 7-19 GM/118ML RE ENEM
1.0000 | ENEMA | Freq: Once | RECTAL | Status: AC
  Administered 2018-05-05: 15:00:00 via RECTAL
  Filled 2018-05-05: qty 1

## 2018-05-05 MED ORDER — LACTULOSE 10 GM/15ML PO SOLN
30.0000 g | Freq: Two times a day (BID) | ORAL | Status: DC
  Administered 2018-05-05 – 2018-05-11 (×8): 30 g via ORAL
  Filled 2018-05-05 (×10): qty 45

## 2018-05-05 MED ORDER — PANTOPRAZOLE SODIUM 40 MG PO TBEC
80.0000 mg | DELAYED_RELEASE_TABLET | Freq: Two times a day (BID) | ORAL | Status: DC
  Administered 2018-05-05: 80 mg via ORAL
  Filled 2018-05-05 (×2): qty 2

## 2018-05-05 MED FILL — Sodium Chloride IV Soln 0.9%: INTRAVENOUS | Qty: 1000 | Status: AC

## 2018-05-05 MED FILL — Heparin Sodium (Porcine) Inj 1000 Unit/ML: INTRAMUSCULAR | Qty: 30 | Status: AC

## 2018-05-05 MED FILL — Sodium Chloride Irrigation Soln 0.9%: Qty: 3000 | Status: AC

## 2018-05-05 NOTE — Progress Notes (Signed)
NAME:  Leah JewelLinda D Mata, MRN:  [id number], DOB:  03/09/1946, LOS: 7 ADMISSION DATE:  04/28/2018, CONSULTATION DATE:  04/29/2018 REFERRING MD:  Dr. Arbie CookeyEarly, CHIEF COMPLAINT:  AAA  Brief History   [age] yoF presenting with acute onset of RLQ pain w/N/V and radiation to back with hypotension found to have ruptured AAA.  Taken to emergently to OR  Past Medical History  Current smoker, COPD, HTN, and HLD   Significant Hospital Events   1/31 Admit-> OR 2/1 extubated 2/2: Pulmonary signed off.  Recommending aggressive pulmonary hygiene measures 2/4:Developing worsening shortness of breath had worsening blood pressure, worsening pulmonary edema and right greater than left pleural effusions.  Blood pressure management escalated, diuretics ordered, placed on BiPAP. 2/5: Pulmonary formally reconsulted, diuretics continued, beta-blockade added given tachycardia, ordered echocardiogram.  Had atrial fibrillation with rapid ventricular response ultimately ended up on amiodarone infusion.  Ultrasound of right chest demonstrated more consolidation and very minimal effusion, started on ceftriaxone for possible aspiration pneumonia 2/6: Chest x-ray clearing.  Oxygen requirements seem to be improving.  Reporting constipation, addressing this with MiraLAX and laxative of choice as well as stool softener.  Holding diuretics due to creatinine bump.  Doppler review shows diastolic dysfunction. 2/7: X-ray continues to improve slowly.  Had acute episode of shortness of breath and upper airway wheezing following refluxing of orange juice.  Suspecting upper airway irritation a large contributing factor to her symptoms.  Still on amiodarone but planning on likely stopping in the 24 hours still has significant constipation Consults:   Procedures:  1/31 OR-> AAA repair 1/31 ETT >> 2/1  Significant Diagnostic Tests:  1/31 >> CT abd/ pelvis >>8.3 cm infrarenal abdominal aortic aneurysm with large right retroperitoneal hematoma most  compatible with aortic rupture. 2/5: Echocardiogram>>> hyperdynamic left ventricle EF greater than 65% abnormal diastolic relaxation no wall motion abnormality Micro Data:   Antimicrobials:  1/31 Cefazolin preop 2/5: Ceftriaxone Interim history/subjective:  Feels a little better   Objective   Blood pressure (Abnormal) 144/76, pulse 90, temperature 97.8 F (36.6 C), temperature source Axillary, resp. rate (Abnormal) 24, height 5\' 4"  (1.626 m), weight 83.9 kg, last menstrual period 03/29/1993, SpO2 94 %.        Intake/Output Summary (Last 24 hours) at 05/05/2018 1025 Last data filed at 05/05/2018 0600 Gross per 24 hour  Intake 684.17 ml  Output 785 ml  Net -100.83 ml   Filed Weights   04/28/18 1603 05/01/18 0600  Weight: 69.9 kg 83.9 kg   Examination: General: Is a pleasant 73 year old female she is currently up in her chair and in no acute distress HEENT normocephalic atraumatic has remarkable upper airway wheezing Pulmonary: Some scattered rhonchi with rhonchus cough, no accessory use Cardiac: Regular irregular with frequent paroxysmal atrial beats Abdomen: Soft not tender Extremities: Warm and dry Neuro: Awake oriented no focal deficits GU: Clear yellow  Resolved Hospital Problem list   Acute blood loss anemia Acute kidney injury  Assessment & Plan:   Acute on chronic hypoxic respiratory failure In the setting of presumed aspiration pneumonia (no organism specified), complicated by element of volume overload with small pleural effusion Possible underlying obstructive lung disease Her portable chest x-ray was personally reviewed: This demonstrates stable but slightly improved bilateral airspace disease right greater than left. She had an acute episode this morning with associated shortness of breath this was following a reflux episode after drinking orange juice.  She still has loud audible upper airway wheezing and I think reflux and upper airway  complaints are a large  contributing factor to her symptom burden  Plan Continuing BiPAP PRN  Wean oxygen for sats greater than 92  Continue bronchodilators  Continue spirometry  Day #3 ceftriaxone, will plan on 7 days  Reflux precaution  Increase PPI and add H2 blockade  Treat constipation this may be contributing to reflux  Mobilize   Atrial fibrillation with rapid ventricular response w/ CHAD2VASC of 5  -She now has a controlled ventricular rhythm.  She is in and out of atrial fibrillation this morning as of 2/7 Plan Continuing current amiodarone for now. We will likely discontinue this 2/8; still need to decide about long-term anticoagulation, would appreciate primary services input here  Hypertension with diastolic dysfunction by echocardiogram on 2/5 -Blood pressure now better controlled -Left ventricle exhibits hyperdynamic systolic function greater than 65%.  The cavity size is normal.  There is evidence of impaired diastolic relaxation.  RV and RA is normal sinus Plan No change in Norvasc or lisinopril daily Hydralazine as needed   Ruptured infrarenal AAA s/p open repair with EBL ~4L Plan Postoperative management per vascular surgery continuing blood pressure control At risk for fluid and electrolyte imbalance: With mild AKI following attempted increase in diuretics on 2/5 Plan Hold diuretics again today A.m. chemistry  Constipation: Plan Will need Fleet enema today, will add lactulose as well  acute blood loss anemia w/ ebl 4.2 liters.  -Hemoglobin is remained stable over the last 72 hours Plan Trend cbc   Best practice:  Diet: reg diet  Pain/Anxiety/Delirium NA VAP protocol (if indicated): dc DVT prophylaxis: heparin SQ GI prophylaxis: protonix Glucose control: SSI  Mobility:OOB Code Status: Full  Family Communication:  Disposition: Looking better.  Treating upper airway irritation, continuing general postoperative care  Simonne Martinet ACNP-BC [facility] Pulmonary/Critical  Care Pager # [phone] OR # [phone] if no answer

## 2018-05-05 NOTE — Consult Note (Addendum)
Cardiology Consultation:   Patient ID: SUAD AUTREY MRN: [id number]; DOB: 05/02/1945  Admit date: 04/28/2018 Date of Consult: 05/05/2018  Primary Care Provider: Corwin Levins, MD Primary Cardiologist: New - Dr. Elease Hashimoto Primary Electrophysiologist:  None    Patient Profile:   Leah Mata is a 73 y.o. female with a hx of HTN, HLD and COPD who is being seen today for the evaluation of atrial fibrillation after repair of ruptured AAA at the request of Dr. Arbie Cookey.  History of Present Illness:   Leah Mata is a 73 year old female with past medical history of hypertension, hyperlipidemia, and COPD who recently presented to an apparent hospital on 04/28/2018 with sudden onset of right upper quadrant abdominal pain.  CT scan showed a 8.3 cm infrarenal abdominal aortic aneurysm with large right retroperitoneal hematoma most compatible with aortic rupture.  She was transferred urgently to [facility] and underwent open repair with a Hemashield grafts.  She was extubated shortly after the surgery.  She initially recovered well however developed worsening dyspnea.  She was initially diuresed due to concern of volume overload.  Bedside ultrasound was performed by pulmonology service with noted consolidation in the right lower lobe concerning for aspiration pneumonia.  She was subsequently placed on antibiotic.  On 05/03/2018, she went into a short episode of atrial fibrillation with RVR.  She was placed on IV amiodarone which resulted in conversion to sinus rhythm.  Since then, she has not had any further recurrence of atrial fibrillation has wandering pacemaker leads versus multifocal atrial tachycardia.  Echocardiogram obtained on 05/03/2018 showed EF greater than 65%, impaired diastolic relaxation, mild dilatation of the aortic root.  Cardiology has been consulted for atrial fibrillation.     Past Medical History:  Diagnosis Date  . BACK PAIN 03/14/2009  . COPD 11/01/2006  . GLUCOSE INTOLERANCE  03/14/2009  . Headache(784.0) 03/14/2009  . HYPERTENSION 11/01/2006  . 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 INSOMNIA, HX OF 01/06/2007  . Other and unspecified hyperlipidemia 08/22/2013  . Urinary frequency 03/14/2009    Past Surgical History:  Procedure Laterality Date  . ABDOMINAL AORTIC ANEURYSM REPAIR N/A 04/28/2018   Procedure: ANEURYSM ABDOMINAL AORTIC REPAIR WITH HEMASHIELD GOLD VASCULAR GRAFT 22X11 mm;  Surgeon: Larina Earthly, MD;  Location: [facility] OR;  Service: Vascular;  Laterality: N/A;  . CARDIAC CATHETERIZATION  2003   negative  . DILATION AND CURETTAGE OF UTERUS    . TONSILLECTOMY    . TUBAL LIGATION       Home Medications:  Prior to Admission medications   Medication Sig Start Date End Date Taking? Authorizing Provider  albuterol (VENTOLIN HFA) 108 (90 Base) MCG/ACT inhaler Inhale 2 puffs into the lungs every 6 (six) hours as needed for wheezing or shortness of breath. 03/12/17  Yes Corwin Levins, MD  amLODipine (NORVASC) 5 MG tablet Take 1 tablet (5 mg total) by mouth daily. 04/06/18  Yes Corwin Levins, MD  fluticasone [facility]) 50 MCG/ACT nasal spray Place 2 sprays into both nostrils daily. 03/12/17 04/28/18 Yes Corwin Levins, MD  lisinopril (PRINIVIL,ZESTRIL) 20 MG tablet Take 1 tablet (20 mg total) by mouth daily. 04/06/18  Yes Corwin Levins, MD  pravastatin (PRAVACHOL) 20 MG tablet Take 1 tablet (20 mg total) by mouth daily. 04/06/18  Yes Corwin Levins, MD  doxycycline (VIBRA-TABS) 100 MG tablet Take 1 tablet (100 mg total) by mouth 2 (two) times daily. Patient not taking: Reported on 04/28/2018 04/06/18   Oliver Barre  W, MD  predniSONE (DELTASONE) 10 MG tablet 3 tabs by mouth per day for 3 days,2tabs per day for 3 days,1tab per day for 3 days Patient not taking: Reported on 04/28/2018 04/06/18   Corwin LevinsJohn, James W, MD  silver sulfADIAZINE (SILVADENE) 1 % cream Apply 1 application topically daily. Patient not taking: Reported on 04/28/2018 11/17/17   Myrlene Brokerrawford, Elizabeth A, MD    temazepam (RESTORIL) 15 MG capsule TAKE  1 CAPSULES BY MOUTH AT BEDTIME AS NEEDED Patient not taking: Reported on 04/28/2018 03/12/17   Corwin LevinsJohn, James W, MD    Inpatient Medications: Scheduled Meds: . amLODipine  10 mg Oral Daily  . arformoterol  15 mcg Nebulization BID  . budesonide (PULMICORT) nebulizer solution  0.25 mg Nebulization BID  . chlorhexidine  15 mL Mouth Rinse BID  . docusate sodium  100 mg Oral Daily  . fluticasone  2 spray Each Nare Daily  . heparin  5,000 Units Subcutaneous Q8H  . ipratropium-albuterol  3 mL Nebulization Q6H  . lactulose  30 g Oral BID  . lisinopril  20 mg Oral Daily  . mouth rinse  15 mL Mouth Rinse q12n4p  . metoprolol tartrate  5 mg Intravenous Q6H  . pantoprazole  80 mg Oral BID  . polyethylene glycol  17 g Oral Daily  . pravastatin  20 mg Oral q1800  . senna-docusate  2 tablet Oral QHS  . sodium phosphate  1 enema Rectal Once   Continuous Infusions: . sodium chloride Stopped (05/04/18 1938)  . amiodarone 30 mg/hr (05/05/18 1000)  . cefTRIAXone (ROCEPHIN)  IV Stopped (05/04/18 1823)  . magnesium sulfate 1 - 4 g bolus IVPB     PRN Meds: sodium chloride, albuterol, alum & mag hydroxide-simeth, bisacodyl, guaiFENesin-dextromethorphan, hydrALAZINE, magnesium sulfate 1 - 4 g bolus IVPB, morphine injection, ondansetron, oxyCODONE-acetaminophen, phenol, potassium chloride  Allergies:    Allergies  Allergen Reactions  . Hydrochlorothiazide     Social History:   Social History   Socioeconomic History  . Marital status: Widowed    Spouse name: Not on file  . Number of children: Not on file  . Years of education: Not on file  . Highest education level: Not on file  Occupational History  . Not on file  Social Needs  . Financial resource strain: Not on file  . Food insecurity:    Worry: Not on file    Inability: Not on file  . Transportation needs:    Medical: Not on file    Non-medical: Not on file  Tobacco Use  . Smoking status:  Current Every Day Smoker  . Smokeless tobacco: Never Used  . Tobacco comment: 2 packs per week  Substance and Sexual Activity  . Alcohol use: No  . Drug use: No  . Sexual activity: Never    Birth control/protection: Surgical    Comment: BTL  Lifestyle  . Physical activity:    Days per week: Not on file    Minutes per session: Not on file  . Stress: Not on file  Relationships  . Social connections:    Talks on phone: Not on file    Gets together: Not on file    Attends religious service: Not on file    Active member of club or organization: Not on file    Attends meetings of clubs or organizations: Not on file    Relationship status: Not on file  . Intimate partner violence:    Fear of current or ex partner: Not  on file    Emotionally abused: Not on file    Physically abused: Not on file    Forced sexual activity: Not on file  Other Topics Concern  . Not on file  Social History Narrative  . Not on file    Family History:    Family History  Problem Relation Age of Onset  . Coronary artery disease Other        female 1st degree relative <60 and female 1st degree relative <50  . Diabetes Father   . Heart attack Father   . Diabetes Mother   . Hypertension Mother   . Congestive Heart Failure Mother   . Colon cancer Neg Hx   . Breast cancer Neg Hx      ROS:  Please see the history of present illness.   All other ROS reviewed and negative.     Physical Exam/Data:   Vitals:   05/05/18 0757 05/05/18 0800 05/05/18 0900 05/05/18 1000  BP: (!) 144/76 (!) 160/91 132/74 (!) 143/85  Pulse: 90 90    Resp: (!) 24 (!) 27 (!) 23 (!) 22  Temp:      TempSrc:      SpO2: 94% 100% 97% 96%  Weight:      Height:        Intake/Output Summary (Last 24 hours) at 05/05/2018 1112 Last data filed at 05/05/2018 1000 Gross per 24 hour  Intake 493.48 ml  Output 955 ml  Net -461.52 ml   Last 3 Weights 05/01/2018 04/28/2018 04/06/2018  Weight (lbs) 184 lb 15.5 oz 154 lb 161 lb 8 oz  Weight  (kg) 83.9 kg 69.854 kg 73.256 kg     Body mass index is 31.75 kg/m.  General:  Well nourished, well developed, in no acute distress HEENT: normal Lymph: no adenopathy Neck: no JVD Endocrine:  No thryomegaly Vascular: No carotid bruits; FA pulses 2+ bilaterally without bruits  Cardiac:  normal S1, S2; RRR; no murmur  Lungs: Persistent wheezing bilaterally, markedly diminished breath sounds in the right base. Abd: midline dressing noted.  Ext: no edema Musculoskeletal:  No deformities, BUE and BLE strength normal and equal Skin: warm and dry  Neuro:  CNs 2-12 intact, no focal abnormalities noted Psych:  Normal affect   EKG:  The EKG was personally reviewed and demonstrates: Atrial fibrillation on EKG 05/03/2018 Telemetry:  Telemetry was personally reviewed and demonstrates: Atrial rhythm with wandering atrial pacemaker with frequent PVCs.  Relevant CV Studies:  Echo 05/03/2018 1. The left ventricle has hyperdynamic systolic function of >65%. The cavity size is normal. There is no increased left ventricular wall thickness. Echo evidence of impaired diastolic relaxation.  2. The right ventricle has normal systolic function. The cavity in normal in size. There is no increase in right ventricular wall thickness.  3. The mitral valve is normal in structure.  4. The aortic valve has an indeterminant number of cusps.  5. The pulmonic valve is Not well seen. Pulmonic valve regurgitation was not assessed by color flow Doppler.  6. There is mild dilatation of the aortic root.  7. The interatrial septum was not assessed.  8. Extremely limited due to poor sound wave transmission; definity used; vigorous LV systolic function; mild diastolic dysfunction.  Laboratory Data:  Chemistry Recent Labs  Lab 05/03/18 0025 05/03/18 0408 05/04/18 0358  NA 137 138 138  K 3.6 3.7 4.3  CL 101 103 102  CO2 26 26 25   GLUCOSE 120* 116* 119*  BUN 15  17 36*  CREATININE 0.78 0.76 1.22*  CALCIUM 9.5 9.7 9.5    GFRNONAA >60 >60 45*  GFRAA >60 >60 52*  ANIONGAP 10 9 11     Recent Labs  Lab 04/28/18 1649 04/29/18 0409 05/04/18 0358  PROT 6.7 4.5* 5.5*  ALBUMIN 3.6 3.0* 2.4*  AST 20 97* 28  ALT 16 63* 26  ALKPHOS 65 33* 58  BILITOT 0.6 1.0 2.6*   Hematology Recent Labs  Lab 04/30/18 0413 05/02/18 0114 05/04/18 0358  WBC 11.2* 10.3 12.8*  RBC 4.58 4.26 4.66  HGB 13.5 12.8 13.4  HCT 41.3 38.9 41.8  MCV 90.2 91.3 89.7  MCH 29.5 30.0 28.8  MCHC 32.7 32.9 32.1  RDW 15.8* 14.9 15.1  PLT 50* 66* 179   Cardiac EnzymesNo results for input(s): TROPONINI in the last 168 hours. No results for input(s): TROPIPOC in the last 168 hours.  BNP Recent Labs  Lab 05/03/18 1004  BNP 275.0*    DDimer  Recent Labs  Lab 04/29/18 0409  DDIMER 10.93*    Radiology/Studies:  Dg Chest Port 1 View  Result Date: 05/05/2018 CLINICAL DATA:  Respiratory failure.  Pneumonia. EXAM: PORTABLE CHEST 1 VIEW COMPARISON:  05/04/2018.  05/02/2018.  04/29/2018. FINDINGS: Patchy pulmonary density in the mid and lower lungs bilaterally persists consistent with mild bronchopneumonia and atelectasis. No worsening or new finding. IMPRESSION: Persistent mild patchy bilateral density consistent with atelectasis or pneumonia. No new finding. Electronically Signed   By: Paulina FusiMark  Shogry M.D.   On: 05/05/2018 08:18   Dg Chest Port 1 View  Result Date: 05/04/2018 CLINICAL DATA:  Status post AAA repair EXAM: PORTABLE CHEST 1 VIEW COMPARISON:  05/02/2018 FINDINGS: Cardiac shadow remains enlarged. Aortic calcifications are again seen. The overall inspiratory effort is poor persistent right basilar opacity is noted although significantly improved when compared with the prior exam. Patchy opacities are noted in the left mid lung. Overall interstitial edema is noted as well. No new focal abnormality is seen. IMPRESSION: Diffuse interstitial edema. Patchy infiltrative/atelectatic changes are noted bilaterally although slightly improved  when compared with the prior exam. Electronically Signed   By: Alcide CleverMark  Lukens M.D.   On: 05/04/2018 08:36   Dg Chest Port 1 View  Result Date: 05/02/2018 CLINICAL DATA:  Shortness of breath EXAM: PORTABLE CHEST 1 VIEW COMPARISON:  04/30/2018 FINDINGS: There is subtly increased bibasilar, right greater than left heterogeneous opacity and pleural effusions. There is mild underlying diffuse interstitial pulmonary opacity. Findings are concerning for worsened edema and effusions. There has been interval removal of an esophagogastric tube and right neck vascular sheath. Unchanged cardiomegaly. The visualized skeletal structures are unremarkable. IMPRESSION: There is subtly increased bibasilar, right greater than left heterogeneous opacity and pleural effusions. There is mild underlying diffuse interstitial pulmonary opacity. Findings are concerning for worsened edema and effusions. Unchanged cardiomegaly. Electronically Signed   By: Lauralyn PrimesAlex  Bibbey M.D.   On: 05/02/2018 16:15   [place] Ekg Site Rite  Result Date: 05/03/2018 If Site Rite image not attached, placement could not be confirmed due to current cardiac rhythm.   Assessment and Plan:   1. PAF: Occurred in the setting of aspiration pneumonia and recovery for AAA repair.  She has not had any further recurrence of atrial fibrillation based on telemetry, the heart rate is still irregular on the telemetry due to frequent PACs, wondering atrial pacemaker and multifocal atrial tachycardia.    - Will discuss with MD regarding anticoagulation therapy.  - CHA2DS2-Vasc score 3 (female 1, age  1, HTN 1)   -Since atrial fibrillation only occurred in the setting of aspiration pneumonia and surgery, may consider maintain the patient on low-dose metoprolol tartrate 12.5 mg twice daily and oral amiodarone 200 mg daily and wean off amiodarone after 30 days.  And may consider 30-day event monitor after she come off of amiodarone to check for recurrence of atrial fibrillation  before deciding on anticoagulation therapy.  -Alternatively, we can consider short-term anticoagulation and if fails to have recurrence of A. Fib, may discontinue anticoagulation after few months.  -Further recommendation per MD.  2. AAA repair: 8.3 cm infrarenal AAA, status post repair by Dr. Arbie Cookey  3. Acute respiratory failure:  - aspiration pneumonia: Currently on dysphagia 2 diet.  4. Hypertension: Consider switching IV Lopressor to metoprolol tartrate 12.5 mg twice daily  5. Hyperlipidemia: On pravastatin 20 mg daily  6. Elevated right hemidiaphragm      For questions or updates, please contact [facility] Please consult [email or web address] for contact info under     Ramond Dial, [place]  05/05/2018 11:12 AM   Attending Note:   The patient was seen and examined.  Agree with assessment and plan as noted above.  Changes made to the above note as needed.  Patient seen and independently examined with  Azalee Course, PA .   We discussed all aspects of the encounter. I agree with the assessment and plan as stated above.  1.  Paroxysmal atrial fibrillation: Leah Mata had an episode of atrial fibrillation in the setting of a postoperative state and in the setting of a postoperative pneumonia. Converted back to normal sinus rhythm on amiodarone. Is maintained normal sinus rhythm.  She has multifocal atrial tachycardia and frequent premature atrial contractions but no evidence of atrial fibrillation since she converted back to sinus rhythm on February 5.  I would anticipate continuing amiodarone at least for 3 to 6 months. Is unclear whether she actually needs to be anticoagulated.  The atrial fibrillation occurred in the setting of a ruptured abdominal aortic aneurysm and postoperative pneumonia and she is not had any recurrent episodes of atrial fibrillation.  We will continue to monitor and can make that decision as we observe for further episodes of AF    I have spent a total of 40 minutes  with patient reviewing hospital  notes , telemetry, EKGs, labs and examining patient as well as establishing an assessment and plan that was discussed with the patient. > 50% of time was spent in direct patient care.    Vesta Mixer, Montez Hageman., MD, [facility] 05/05/2018, 2:03 PM [address],  [address] Office [phone] Pager [phone]

## 2018-05-05 NOTE — Consult Note (Signed)
Physical Medicine and Rehabilitation Consult Reason for Consult:  Decreased functional mobility Referring Physician:  Dr. Arbie Cookey   HPI: Leah Mata is a 73 y.o.right handed female with history of hypertension,hyperlipidemia and tobacco abuse. Per chart review patient lives with son and grandson. Independent prior to admission. One level home with 5 steps to entry. Son works during the day and grandson attends school.Patient initially presented to [facility] with right sided abdominal pain. CT scan showed large greater than 8 cm infrarenal aneurysm with contained rupture in the right retroperitoneal space. She was transferred to [facility]. 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 Arbie Cookey. Hospital course patient developed increased shortness of breath on 05/02/2018 with bilateral lower lobe infiltrates maintained on IV antibiotics. She did require intubation until 05/02/2018. Cardiology services consulted to 05/05/2018 for new onset atrial fibrillation. Echocardiogram with ejection fraction of 65%. Systolic function normal. Await plan for anticoagulation in regards to atrial fibrillation. Patient is on subcutaneous heparin for DVT prophylaxis.Currently on dysphagia #2 thin liquid diet.   Review of Systems  Constitutional: Negative for chills and fever.  HENT: Negative for hearing loss.   Eyes: Negative for blurred vision and double vision.  Respiratory: Negative for cough and shortness of breath.   Cardiovascular: Negative for chest pain, palpitations and leg swelling.  Gastrointestinal: Positive for abdominal pain.  Genitourinary: Negative for dysuria, flank pain and hematuria.  Musculoskeletal: Positive for back pain.  Skin: Negative for rash.  Neurological: Positive for headaches.  Psychiatric/Behavioral: The patient has insomnia.   All other systems reviewed and are negative.  Past Medical History:  Diagnosis Date  . BACK  PAIN 03/14/2009  . COPD 11/01/2006  . GLUCOSE INTOLERANCE 03/14/2009  . Headache(784.0) 03/14/2009  . HYPERTENSION 11/01/2006  . 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 INSOMNIA, HX OF 01/06/2007  . Other and unspecified hyperlipidemia 08/22/2013  . Urinary frequency 03/14/2009   Past Surgical History:  Procedure Laterality Date  . ABDOMINAL AORTIC ANEURYSM REPAIR N/A 04/28/2018   Procedure: ANEURYSM ABDOMINAL AORTIC REPAIR WITH HEMASHIELD GOLD VASCULAR GRAFT 22X11 mm;  Surgeon: Larina Earthly, MD;  Location: [facility] OR;  Service: Vascular;  Laterality: N/A;  . CARDIAC CATHETERIZATION  2003   negative  . DILATION AND CURETTAGE OF UTERUS    . TONSILLECTOMY    . TUBAL LIGATION     Family History  Problem Relation Age of Onset  . Coronary artery disease Other        female 1st degree relative <60 and female 1st degree relative <50  . Diabetes Father   . Heart attack Father   . Diabetes Mother   . Hypertension Mother   . Congestive Heart Failure Mother   . Colon cancer Neg Hx   . Breast cancer Neg Hx    Social History:  reports that she has been smoking. She has never used smokeless tobacco. She reports that she does not drink alcohol or use drugs. Allergies:  Allergies  Allergen Reactions  . Hydrochlorothiazide    Medications Prior to Admission  Medication Sig Dispense Refill  . albuterol (VENTOLIN HFA) 108 (90 Base) MCG/ACT inhaler Inhale 2 puffs into the lungs every 6 (six) hours as needed for wheezing or shortness of breath. 3 Inhaler 3  . amLODipine (NORVASC) 5 MG tablet Take 1 tablet (5 mg total) by mouth daily. 90 tablet 3  . fluticasone (FLONASE) 50 MCG/ACT nasal spray Place 2 sprays into both nostrils daily.  48 g 3  . lisinopril (PRINIVIL,ZESTRIL) 20 MG tablet Take 1 tablet (20 mg total) by mouth daily. 90 tablet 3  . pravastatin (PRAVACHOL) 20 MG tablet Take 1 tablet (20 mg total) by mouth daily. 90 tablet 3  . doxycycline (VIBRA-TABS) 100 MG tablet Take 1 tablet (100 mg  total) by mouth 2 (two) times daily. (Patient not taking: Reported on 04/28/2018) 20 tablet 0  . predniSONE (DELTASONE) 10 MG tablet 3 tabs by mouth per day for 3 days,2tabs per day for 3 days,1tab per day for 3 days (Patient not taking: Reported on 04/28/2018) 18 tablet 0  . silver sulfADIAZINE (SILVADENE) 1 % cream Apply 1 application topically daily. (Patient not taking: Reported on 04/28/2018) 50 g 0  . temazepam (RESTORIL) 15 MG capsule TAKE  1 CAPSULES BY MOUTH AT BEDTIME AS NEEDED (Patient not taking: Reported on 04/28/2018) 90 capsule 1    Home: Home Living Family/patient expects to be discharged to:: Private residence Living Arrangements: Children Available Help at Discharge: Family, Available PRN/intermittently Type of Home: House Home Access: Stairs to enter Secretary/administratorntrance Stairs-Number of Steps: 5 Entrance Stairs-Rails: Right, Left Home Layout: One level Bathroom Shower/Tub: Tub/shower unit, Engineer, building servicesCurtain Bathroom Toilet: Handicapped height Home Equipment: None  Functional History: Prior Function Level of Independence: Independent Comments: enjoys watching movies and collecting coupons Functional Status:  Mobility: Bed Mobility Overal bed mobility: Needs Assistance Bed Mobility: Rolling, Sidelying to Sit Rolling: Min assist Sidelying to sit: Min assist General bed mobility comments: pulled up on PT's hand to raise trunk Transfers Overall transfer level: Needs assistance Equipment used: Ambulation equipment used(eva walker) Transfers: Sit to/from Stand Sit to Stand: Min assist Stand pivot transfers: Min assist, +2 safety/equipment General transfer comment: min assist to rise and steady, cues for hand placement Ambulation/Gait Ambulation/Gait assistance: Min assist Gait Distance (Feet): 120 Feet Assistive device: (eva walker) Gait Pattern/deviations: Step-through pattern, Decreased stride length, Shuffle, Drifts right/left General Gait Details: patient with significant deficits  in activity tolerance, Needed 8 liters at rest and 10 L wtih acitivity to keep sats >90%.  HR 98-128 bpm.  DOE 3/4.  Several  standing rest breaks with cues for pursed lip breathing. Gait velocity: decreased Gait velocity interpretation: <1.31 ft/sec, indicative of household ambulator    ADL: ADL Overall ADL's : Needs assistance/impaired Eating/Feeding: NPO Grooming: Supervision/safety, Set up, Sitting Upper Body Bathing: Minimal assistance, Sitting Lower Body Bathing: Maximal assistance, Sit to/from stand Upper Body Dressing : Minimal assistance, Sitting Upper Body Dressing Details (indicate cue type and reason): for front opening gown Lower Body Dressing: Maximal assistance, Sit to/from stand Lower Body Dressing Details (indicate cue type and reason): Unable to bring ankles to knees due to pain. Max A to don socks while seated at [employer] Company[employer]OB Toilet Transfer: Moderate assistance, Minimal assistance, +2 for physical assistance, Stand-pivot(Simulated to recliner) Functional mobility during ADLs: Minimal assistance, Rolling walker, +2 for safety/equipment General ADL Comments: Pt limited by significant pain at abdomen. Highly motivated to participate in therapy.   Cognition: Cognition Overall Cognitive Status: Within Functional Limits for tasks assessed Orientation Level: Oriented X4 Cognition Arousal/Alertness: Awake/alert Behavior During Therapy: Flat affect Overall Cognitive Status: Within Functional Limits for tasks assessed  Blood pressure 136/70, pulse 88, temperature 99.1 F (37.3 C), resp. rate 20, height 5\' 4"  (1.626 m), weight 83.9 kg, last menstrual period 03/29/1993, SpO2 90 %. Physical Exam  Vitals reviewed. Constitutional: She is oriented to person, place, and time.  HENT:  Head: Normocephalic.  Neck: Normal range of motion. Neck  supple. No thyromegaly present.  Cardiovascular:  Cardiac rate controlled  Respiratory: Effort normal and breath sounds normal.  GI: Soft. Bowel  sounds are normal. She exhibits no distension.  Neurological: She is alert and oriented to person, place, and time.    No results found for this or any previous visit (from the past 24 hour(s)). Dg Chest Port 1 View  Result Date: 05/05/2018 CLINICAL DATA:  Respiratory failure.  Pneumonia. EXAM: PORTABLE CHEST 1 VIEW COMPARISON:  05/04/2018.  05/02/2018.  04/29/2018. FINDINGS: Patchy pulmonary density in the mid and lower lungs bilaterally persists consistent with mild bronchopneumonia and atelectasis. No worsening or new finding. IMPRESSION: Persistent mild patchy bilateral density consistent with atelectasis or pneumonia. No new finding. Electronically Signed   By: Paulina FusiMark  Shogry M.D.   On: 05/05/2018 08:18   Dg Chest Port 1 View  Result Date: 05/04/2018 CLINICAL DATA:  Status post AAA repair EXAM: PORTABLE CHEST 1 VIEW COMPARISON:  05/02/2018 FINDINGS: Cardiac shadow remains enlarged. Aortic calcifications are again seen. The overall inspiratory effort is poor persistent right basilar opacity is noted although significantly improved when compared with the prior exam. Patchy opacities are noted in the left mid lung. Overall interstitial edema is noted as well. No new focal abnormality is seen. IMPRESSION: Diffuse interstitial edema. Patchy infiltrative/atelectatic changes are noted bilaterally although slightly improved when compared with the prior exam. Electronically Signed   By: Alcide CleverMark  Lukens M.D.   On: 05/04/2018 08:36   [place] Ekg Site Rite  Result Date: 05/03/2018 If Site Rite image not attached, placement could not be confirmed due to current cardiac rhythm.    Assessment/Plan: Diagnosis: debility after ruptured abdominal aortic aneurysm and repair 1. Does the need for close, 24 hr/day medical supervision in concert with the patient's rehab needs make it unreasonable for this patient to be served in a less intensive setting? Yes 2. Co-Morbidities requiring supervision/potential complications:  HTN, 3. Due to bladder management, bowel management, safety, skin/wound care, disease management, medication administration, pain management and patient education, does the patient require 24 hr/day rehab nursing? Yes 4. Does the patient require coordinated care of a physician, rehab nurse, PT (1-2 hrs/day, 5 days/week) and OT (1-2 hrs/day, 5 days/week) to address physical and functional deficits in the context of the above medical diagnosis(es)? Yes Addressing deficits in the following areas: balance, endurance, locomotion, strength, transferring, bowel/bladder control, bathing, dressing, feeding, grooming, toileting and psychosocial support 5. Can the patient actively participate in an intensive therapy program of at least 3 hrs of therapy per day at least 5 days per week? Yes 6. The potential for patient to make measurable gains while on inpatient rehab is good 7. Anticipated functional outcomes upon discharge from inpatient rehab are modified independent and supervision  with PT, modified independent and supervision with OT, n/a with SLP. 8. Estimated rehab length of stay to reach the above functional goals is: 10-15 days 9. Anticipated D/C setting: Home 10. Anticipated post D/C treatments: HH therapy 11. Overall Rehab/Functional Prognosis: excellent  RECOMMENDATIONS: This patient's condition is appropriate for continued rehabilitative care in the following setting: CIR Patient has agreed to participate in recommended program. Yes Note that insurance prior authorization may be required for reimbursement for recommended care.  Comment: Rehab Admissions Coordinator to follow up.  Thanks,  Ranelle OysterZachary T. Alexa Blish, MD, [place] DomFAAPMR  I have personally performed a face to face diagnostic evaluation of this patient. Additionally, I have reviewed and concur with the physician assistant's documentation above.    Mcarthur Rossettianiel J  Angiulli, PA-C 05/05/2018

## 2018-05-05 NOTE — Progress Notes (Signed)
Subjective: Interval History: none.. Comfortable with nasal cannula.  Objective: Vital signs in last 24 hours: Temp:  [97.5 F (36.4 C)-99.1 F (37.3 C)] 99.1 F (37.3 C) (02/07 1100) Pulse Rate:  [87-93] 88 (02/07 1337) Resp:  [16-28] 20 (02/07 1337) BP: (109-160)/(57-104) 136/70 (02/07 1337) SpO2:  [90 %-100 %] 90 % (02/07 1337)  Intake/Output from previous day: 02/06 0701 - 02/07 0700 In: 734 [P.O.:240; I.V.:394.2; IV Piggyback:99.9] Out: 935 [Urine:935] Intake/Output this shift: Total I/O In: 66 [I.V.:66] Out: 250 [Urine:250]  Abdomen soft moderately distended.  Plus dorsalis pedis pulses bilaterally  Lab Results: Recent Labs    05/04/18 0358  WBC 12.8*  HGB 13.4  HCT 41.8  PLT 179   BMET Recent Labs    05/03/18 0408 05/04/18 0358  NA 138 138  K 3.7 4.3  CL 103 102  CO2 26 25  GLUCOSE 116* 119*  BUN 17 36*  CREATININE 0.76 1.22*  CALCIUM 9.7 9.5    Studies/Results: Ct Abdomen Pelvis W Contrast  Result Date: 04/28/2018 CLINICAL DATA:  Right lower quadrant pain, nausea. EXAM: CT ABDOMEN AND PELVIS WITH CONTRAST TECHNIQUE: Multidetector CT imaging of the abdomen and pelvis was performed using the standard protocol following bolus administration of intravenous contrast. CONTRAST:  ISOVUE-300 IOPAMIDOL (ISOVUE-300) INJECTION 61% COMPARISON:  None. FINDINGS: Lower chest: Heart is normal size. Descending thoracic aorta is heavily calcified. Dependent atelectasis in the lower lobes. No effusions. Hepatobiliary: Scattered hypodensities in the liver, likely small cysts. Gallbladder unremarkable. Pancreas: No focal abnormality or ductal dilatation. Spleen: No focal abnormality.  Normal size. Adrenals/Urinary Tract: No adrenal abnormality. No focal renal abnormality. No stones or hydronephrosis. Urinary bladder is unremarkable. Stomach/Bowel: Stomach, large and small bowel grossly unremarkable. Vascular/Lymphatic: There is an 8.3 cm infrarenal abdominal aortic  aneurysm. Extensive thrombus/plaque noted. In addition, abnormal soft tissue is noted adjacent to the right side of the aneurysm and extending around the right kidney and in the right retroperitoneum compatible with large hematoma, likely related to aortic rupture. No visible active extravasation. Aneurysm in at the aortic bifurcation. Iliac vessels are heavily calcified, normal caliber. No adenopathy. Reproductive: Uterus and adnexa unremarkable.  No mass. Other: Large right retroperitoneal hematoma noted surrounding the right kidney and extending into the upper pelvis. No free fluid or free air. Bilateral inguinal hernias noted containing fat. Musculoskeletal: No acute bony abnormality. IMPRESSION: 8.3 cm infrarenal abdominal aortic aneurysm with large right retroperitoneal hematoma most compatible with aortic rupture. Critical Value/emergent results were called by telephone at the time of interpretation on 04/28/2018 at 6:40 pm to Dr. Benjiman Core , who verbally acknowledged these results. Electronically Signed   By: Charlett Nose M.D.   On: 04/28/2018 18:40   Dg Chest Port 1 View  Result Date: 05/05/2018 CLINICAL DATA:  Respiratory failure.  Pneumonia. EXAM: PORTABLE CHEST 1 VIEW COMPARISON:  05/04/2018.  05/02/2018.  04/29/2018. FINDINGS: Patchy pulmonary density in the mid and lower lungs bilaterally persists consistent with mild bronchopneumonia and atelectasis. No worsening or new finding. IMPRESSION: Persistent mild patchy bilateral density consistent with atelectasis or pneumonia. No new finding. Electronically Signed   By: Paulina Fusi M.D.   On: 05/05/2018 08:18   Dg Chest Port 1 View  Result Date: 05/04/2018 CLINICAL DATA:  Status post AAA repair EXAM: PORTABLE CHEST 1 VIEW COMPARISON:  05/02/2018 FINDINGS: Cardiac shadow remains enlarged. Aortic calcifications are again seen. The overall inspiratory effort is poor persistent right basilar opacity is noted although significantly improved when  compared with the  prior exam. Patchy opacities are noted in the left mid lung. Overall interstitial edema is noted as well. No new focal abnormality is seen. IMPRESSION: Diffuse interstitial edema. Patchy infiltrative/atelectatic changes are noted bilaterally although slightly improved when compared with the prior exam. Electronically Signed   By: Alcide Clever M.D.   On: 05/04/2018 08:36   Dg Chest Port 1 View  Result Date: 05/02/2018 CLINICAL DATA:  Shortness of breath EXAM: PORTABLE CHEST 1 VIEW COMPARISON:  04/30/2018 FINDINGS: There is subtly increased bibasilar, right greater than left heterogeneous opacity and pleural effusions. There is mild underlying diffuse interstitial pulmonary opacity. Findings are concerning for worsened edema and effusions. There has been interval removal of an esophagogastric tube and right neck vascular sheath. Unchanged cardiomegaly. The visualized skeletal structures are unremarkable. IMPRESSION: There is subtly increased bibasilar, right greater than left heterogeneous opacity and pleural effusions. There is mild underlying diffuse interstitial pulmonary opacity. Findings are concerning for worsened edema and effusions. Unchanged cardiomegaly. Electronically Signed   By: Lauralyn Primes M.D.   On: 05/02/2018 16:15   Dg Chest Port 1 View  Result Date: 04/30/2018 CLINICAL DATA:  Abdominal aortic aneurysm. EXAM: PORTABLE CHEST 1 VIEW COMPARISON:  Radiograph of April 29, 2018. FINDINGS: Stable cardiomediastinal silhouette. Atherosclerosis of thoracic aorta is noted. Endotracheal tube and Swan-Ganz catheter have been removed. Distal tip of nasogastric tube is seen in the stomach. No pneumothorax is noted. Mild bibasilar atelectasis or infiltrates are noted with small pleural effusions. Bony thorax is unremarkable. IMPRESSION: Mild bibasilar atelectasis or infiltrates are noted with small pleural effusions, right greater than left. Endotracheal tube has been removed. Nasogastric  tube is in grossly good position. Electronically Signed   By: Lupita Raider, M.D.   On: 04/30/2018 07:46   Dg Chest Port 1 View  Result Date: 04/29/2018 CLINICAL DATA:  Followup intubated patient. EXAM: PORTABLE CHEST 1 VIEW COMPARISON:  04/29/2018 at 0052 hours FINDINGS: Right internal jugular Swan-Ganz catheter, endotracheal tube and nasal/orogastric tube are stable and well positioned. There are prominent bronchovascular markings. Additional opacity is noted in the lung bases likely a combination of small effusions and atelectasis. This is accentuated by low lung volumes. Findings are without change from the earlier exam. No pneumothorax. IMPRESSION: 1. No significant change from the study obtained earlier today. 2. Support apparatus is well positioned. 3. Prominent bronchovascular markings and persistent lung base opacity consistent with small effusions and atelectasis. No convincing pneumonia or pulmonary edema. Electronically Signed   By: Amie Portland M.D.   On: 04/29/2018 11:56   Dg Chest Port 1 View  Result Date: 04/29/2018 CLINICAL DATA:  Postop abdominal aortic aneurysm stent. EXAM: PORTABLE CHEST 1 VIEW COMPARISON:  12/19/2014 FINDINGS: Endotracheal tube with tip measuring 4.2 cm above the carina. Enteric tube tip is off the field of view but below the left hemidiaphragm. Right Swan-Ganz catheter with tip over the pulmonary outflow tract. No pneumothorax. Shallow inspiration with linear atelectasis in the lung bases, greater on the left. Probable small left pleural effusion. Heart size and pulmonary vascularity are normal for technique. No focal consolidation in the lungs. Mediastinal contours appear intact. Calcification of the aorta. IMPRESSION: Appliances appear in satisfactory location. Shallow inspiration with atelectasis in the lung bases, greater on the left. Probable small left pleural effusion. Electronically Signed   By: Burman Nieves M.D.   On: 04/29/2018 01:16   [place] Ekg Site  Rite  Result Date: 05/03/2018 If Site Rite image not attached, placement could not be confirmed  due to current cardiac rhythm.  Anti-infectives: Anti-infectives (From admission, onward)   Start     Dose/Rate Route Frequency Ordered Stop   05/03/18 1815  cefTRIAXone (ROCEPHIN) 1 g in sodium chloride 0.9 % 100 mL IVPB     1 g 200 mL/hr over 30 Minutes Intravenous Every 24 hours 05/03/18 1728     04/29/18 0400  ceFAZolin (ANCEF) IVPB 2g/100 mL premix     2 g 200 mL/hr over 30 Minutes Intravenous Every 8 hours 04/29/18 0044 04/29/18 1259      Assessment/Plan: s/p Procedure(s): ANEURYSM ABDOMINAL AORTIC REPAIR WITH HEMASHIELD GOLD VASCULAR GRAFT 22X11 mm (N/A) Stable.  Back in normal sinus rhythm.  More comfortable from a respiratory standpoint.  Continuing to mobilize.  Appreciate pulmonary and cardiology's assistance   LOS: 7 days   Coco Sharpnack 05/05/2018, 2:11 PM

## 2018-05-05 NOTE — Progress Notes (Signed)
  Speech Language Pathology Treatment: Dysphagia  Patient Details Name: PATTON WERBER MRN: [id number] DOB: [birth year] Today's Date: 05/05/2018 Time: 5974-1638 SLP Time Calculation (min) (ACUTE ONLY): 14 min  Assessment / Plan / Recommendation Clinical Impression  Per review of chart and discussion with NP, pt appears to be having symptoms of reflux that are thought to be related to her respiratory difficulties over the past few days. She had another episode this morning after drinking orange juice, after which PPI was increased to try to facilitate. SLP reviewed types of food that can exacerbate reflux as well as esophageal and aspiration precautions. Pt consumed a variety of textures with occasional, delayed throat clearing/subtle coughing noted, which could be associated with h/o GER. Mastication is mildly prolonged. Although she is hopeful to eat more food, she also wants something softer. Will start Dys 2 (chopped) diet for energy conservation, particularly in light of respiratory status. Will f/u for tolerance and reinforcement of precautions.   HPI HPI: Pt is a 73 yo female who presented with abdominal and right flank pain. 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 S/p aneurysm abdominal aortic repair with post-op ileus. ETT 1/31-2/1. PMH: COPD, HTN, HLD      SLP Plan  Continue with current plan of care       Recommendations  Diet recommendations: Dysphagia 2 (fine chop);Thin liquid Liquids provided via: Cup;Straw Medication Administration: Whole meds with puree Supervision: Patient able to self feed;Intermittent supervision to cue for compensatory strategies Compensations: Slow rate;Small sips/bites;Follow solids with liquid Postural Changes and/or Swallow Maneuvers: Seated upright 90 degrees;Upright 30-60 min after meal                Oral Care Recommendations: Oral care BID Follow up Recommendations: Inpatient Rehab SLP  Visit Diagnosis: Dysphagia, unspecified (R13.10) Plan: Continue with current plan of care       GO                Virl Axe Koula Venier 05/05/2018, 10:57 AM  Natalia Leatherwood, M.A. CCC-SLP Acute Herbalist [phone] Office [phone]

## 2018-05-06 ENCOUNTER — Inpatient Hospital Stay (HOSPITAL_COMMUNITY): Payer: Medicare PPO

## 2018-05-06 DIAGNOSIS — I4892 Unspecified atrial flutter: Secondary | ICD-10-CM

## 2018-05-06 DIAGNOSIS — K567 Ileus, unspecified: Secondary | ICD-10-CM

## 2018-05-06 DIAGNOSIS — I714 Abdominal aortic aneurysm, without rupture: Secondary | ICD-10-CM

## 2018-05-06 DIAGNOSIS — I10 Essential (primary) hypertension: Secondary | ICD-10-CM

## 2018-05-06 LAB — COMPREHENSIVE METABOLIC PANEL
ALT: 25 U/L (ref 0–44)
ANION GAP: 15 (ref 5–15)
AST: 28 U/L (ref 15–41)
Albumin: 2.5 g/dL — ABNORMAL LOW (ref 3.5–5.0)
Alkaline Phosphatase: 79 U/L (ref 38–126)
BUN: 36 mg/dL — ABNORMAL HIGH (ref 8–23)
CO2: 25 mmol/L (ref 22–32)
Calcium: 9.4 mg/dL (ref 8.9–10.3)
Chloride: 93 mmol/L — ABNORMAL LOW (ref 98–111)
Creatinine, Ser: 1 mg/dL (ref 0.44–1.00)
GFR calc non Af Amer: 57 mL/min — ABNORMAL LOW (ref >60)
Glucose, Bld: 127 mg/dL — ABNORMAL HIGH (ref 70–99)
Potassium: 3.8 mmol/L (ref 3.5–5.1)
Sodium: 133 mmol/L — ABNORMAL LOW (ref 135–145)
Total Bilirubin: 3.5 mg/dL — ABNORMAL HIGH (ref 0.3–1.2)
Total Protein: 6 g/dL — ABNORMAL LOW (ref 6.5–8.1)

## 2018-05-06 LAB — LACTIC ACID, PLASMA: Lactic Acid, Venous: 1.3 mmol/L (ref 0.5–1.9)

## 2018-05-06 MED ORDER — SODIUM CHLORIDE 0.9 % IV SOLN
INTRAVENOUS | Status: DC
  Administered 2018-05-06 – 2018-05-07 (×2): via INTRAVENOUS

## 2018-05-06 MED ORDER — METOPROLOL TARTRATE 5 MG/5ML IV SOLN
5.0000 mg | INTRAVENOUS | Status: DC
  Administered 2018-05-07: 5 mg via INTRAVENOUS
  Filled 2018-05-06: qty 5

## 2018-05-06 MED ORDER — PANTOPRAZOLE SODIUM 40 MG IV SOLR
40.0000 mg | Freq: Two times a day (BID) | INTRAVENOUS | Status: DC
  Administered 2018-05-06 – 2018-05-16 (×21): 40 mg via INTRAVENOUS
  Filled 2018-05-06 (×22): qty 40

## 2018-05-06 MED ORDER — METOCLOPRAMIDE HCL 5 MG/ML IJ SOLN
10.0000 mg | Freq: Three times a day (TID) | INTRAMUSCULAR | Status: AC
  Administered 2018-05-06 – 2018-05-08 (×6): 10 mg via INTRAVENOUS
  Filled 2018-05-06 (×5): qty 2

## 2018-05-06 MED ORDER — METOPROLOL TARTRATE 5 MG/5ML IV SOLN
5.0000 mg | INTRAVENOUS | Status: DC
  Administered 2018-05-06: 5 mg via INTRAVENOUS
  Filled 2018-05-06: qty 5

## 2018-05-06 NOTE — Progress Notes (Signed)
End of shift note: Patient continues to c/o nausea and abdominal distention. NG tube inserted with minimal to moderate relief. Patient had 3-4 episodes of vomiting, green with small particles. Abdomen remains distended and firm. Patient has only voided 100 ml of dark urine this shift. Dr.Clark with vascular surgery aware and wants to keep monitoring. Patient oxygen decreased from 10L HFNC to 3L HFNC this shift. HR remains in NSR. Lots of family at bedside and are concerned about her mood and are worried that she is depressed and is giving up. Will share with MD tomorrow.

## 2018-05-06 NOTE — Social Work (Signed)
CSW acknowledging CIR recommendation and await admissions f/u prior to assisting with SNF.  Octavio Graves, MSW, [facility] Clinical Social Work [phone]

## 2018-05-06 NOTE — Progress Notes (Addendum)
[facility]  Subjective  - Distended and nauseated.  Emesis x1 overnight - bilious per nursing.   Objective 126/65 64 97.6 F (36.4 C) (Oral) (!) 21 98%  Intake/Output Summary (Last 24 hours) at 05/06/2018 0837 Last data filed at 05/06/2018 0700 Gross per 24 hour  Intake 439.15 ml  Output 925 ml  Net -485.85 ml    R DP palpable, L PT palpable Abdomen distended, no rebound or guarding   Laboratory Lab Results: Recent Labs    05/04/18 0358  WBC 12.8*  HGB 13.4  HCT 41.8  PLT 179   BMET Recent Labs    05/04/18 0358 05/06/18 0220  NA 138 133*  K 4.3 3.8  CL 102 93*  CO2 25 25  GLUCOSE 119* 127*  BUN 36* 36*  CREATININE 1.22* 1.00  CALCIUM 9.5 9.4    COAG Lab Results  Component Value Date   INR 1.16 04/30/2018   INR 1.33 04/29/2018   INR 1.47 04/29/2018   No results found for: PTT  Assessment/Planning: S/P open repair of ruptured AAA.  Distended and episode of bilious emesis this am.  Discussed recommend CLD and minimal intake.  Has been getting aggressive bowel regimen.  Will get KUB.  Suspect ileus, states she is still passing gas.  Palpable pulses distally.  Sinus rhythm and apprecaite cardiology input, remains on amiodarone gtt.  8 L [place] high flow keeping in ICU.  Gentle diuresis ongoing and appreciate pulmonology.  Cephus Shelling 05/06/2018 8:37 AM --

## 2018-05-06 NOTE — Progress Notes (Signed)
NAME:  Leah Mata, MRN:  [id number], DOB:  April 14, 1945, LOS: 8 ADMISSION DATE:  04/28/2018, CONSULTATION DATE:  04/29/2018 REFERRING MD:  Dr. Arbie Cookey, CHIEF COMPLAINT:  AAA  Brief History   59 yoF presenting with acute onset of RLQ pain w/N/V and radiation to back with hypotension found to have ruptured AAA.  Taken to emergently to OR  Past Medical History  Current smoker, COPD, HTN, and HLD   Significant Hospital Events   1/31 Admit-> OR 2/1 extubated 2/2: Pulmonary signed off.  Recommending aggressive pulmonary hygiene measures 2/4:Developing worsening shortness of breath had worsening blood pressure, worsening pulmonary edema and right greater than left pleural effusions.  Blood pressure management escalated, diuretics ordered, placed on BiPAP. 2/5: Pulmonary formally reconsulted, diuretics continued, beta-blockade added given tachycardia, ordered echocardiogram.  Had atrial fibrillation with rapid ventricular response ultimately ended up on amiodarone infusion.  Ultrasound of right chest demonstrated more consolidation and very minimal effusion, started on ceftriaxone for possible aspiration pneumonia 2/6: Chest x-ray clearing.  Oxygen requirements seem to be improving.  Reporting constipation, addressing this with MiraLAX and laxative of choice as well as stool softener.  Holding diuretics due to creatinine bump.  Doppler review shows diastolic dysfunction. 2/7: X-ray continues to improve slowly.  Had acute episode of shortness of breath and upper airway wheezing following refluxing of orange juice.  Suspecting upper airway irritation a large contributing factor to her symptoms.  Still on amiodarone but planning on likely stopping in the 24 hours still has significant constipation Consults:   Procedures:  1/31 OR-> AAA repair 1/31 ETT >> 2/1  Significant Diagnostic Tests:  1/31 >> CT abd/ pelvis >>8.3 cm infrarenal abdominal aortic aneurysm with large right retroperitoneal hematoma most  compatible with aortic rupture. 2/5: Echocardiogram>>> hyperdynamic left ventricle EF greater than 65% abnormal diastolic relaxation no wall motion abnormality Micro Data:   Antimicrobials:  1/31 Cefazolin preop 2/5: Ceftriaxone Interim history/subjective:   C?o constipation & abdominal pain C/o dyspnea    Objective   Blood pressure 126/65, pulse 64, temperature 97.6 F (36.4 C), temperature source Oral, resp. rate (!) 21, height 5\' 4"  (1.626 m), weight 83.9 kg, last menstrual period 03/29/1993, SpO2 98 %.        Intake/Output Summary (Last 24 hours) at 05/06/2018 0957 Last data filed at 05/06/2018 0700 Gross per 24 hour  Intake 439.15 ml  Output 925 ml  Net -485.85 ml   Filed Weights   04/28/18 1603 05/01/18 0600  Weight: 69.9 kg 83.9 kg   Examination:  Acutely ill-appearing, lying supine in bed, mild distress. Mild pallor, no icterus Decreased breath sounds bilateral, fine right lower lobe crackles, no accessory muscle use S1-S2 regular. Soft, distended abdomen, mildly tender, no guarding Alert, interactive, nonfocal No edema   Resolved Hospital Problem list   Acute blood loss anemia Acute kidney injury  Assessment & Plan:   Acute on chronic hypoxic respiratory failure In the setting of presumed aspiration pneumonia (no organism specified) Possible underlying obstructive lung disease, abdominal distention contributing  Chest x-ray 2/7 personally reviewed which shows improving aeration bilateral lower lobe  Plan Continuing BiPAP PRN  Wean oxygen for sats greater than 92  Continue bronchodilators  Continue spirometry  Day #4/7 ceftriaxone Reflux precaution  Increase PPI and add H2 blockade   Abdominal distention -received laxatives and Fleet enema 2/7 with no response. 2/8 KUB reviewed which shows small bowel ileus Obtain lactate. Place NG tube to LIS for decompression  Atrial fibrillation with rapid ventricular  response w/ CHAD2VASC of 5  -She now has  a controlled ventricular rhythm.  She is in and out of atrial fibrillation this morning as of 2/7 Plan Continue amiodarone for now.  Need for long-term anticoagulation per cardiology if she has recurrent AF  Hypertension with diastolic dysfunction by echocardiogram on 2/5 -Blood pressure now better controlled -Left ventricle exhibits hyperdynamic systolic function greater than 65%.  The cavity size is normal.  There is evidence of impaired diastolic relaxation.  RV and RA is normal sinus Plan Hold  Norvasc &  lisinopril  For now Hydralazine as needed   Ruptured infrarenal AAA s/p open repair  Plan Postoperative management per vascular surgery  - With mild AKI following attempted increase in diuretics on 2/5 Plan Hold diuretics again today   acute blood loss anemia w/ ebl 4.2 liters.  -Hemoglobin is remained stable over the last 72 hours Plan Trend cbc    PCCM to follow.  Cyril Mourningakesh Dagny Fiorentino MD. Tonny BollmanFCCP. [facility] Pager [phone] If no response call [phone]   05/06/2018

## 2018-05-06 NOTE — Progress Notes (Addendum)
Progress Note  Patient Name: Leah Mata Date of Encounter: 05/06/2018  Primary Cardiologist: No primary care provider on file.   Subjective   Remains in sinus rhythm currently but still intermittently going into atrial flutter with RVR and variable block.  She now has an NG tube in place for ileus and is n.p.o.  Inpatient Medications    Scheduled Meds: . amLODipine  10 mg Oral Daily  . arformoterol  15 mcg Nebulization BID  . budesonide (PULMICORT) nebulizer solution  0.25 mg Nebulization BID  . chlorhexidine  15 mL Mouth Rinse BID  . docusate sodium  100 mg Oral Daily  . fluticasone  2 spray Each Nare Daily  . heparin  5,000 Units Subcutaneous Q8H  . ipratropium-albuterol  3 mL Nebulization Q6H  . lactulose  30 g Oral BID  . mouth rinse  15 mL Mouth Rinse q12n4p  . metoprolol tartrate  5 mg Intravenous Q6H  . pantoprazole (PROTONIX) IV  40 mg Intravenous Q12H  . polyethylene glycol  17 g Oral Daily  . pravastatin  20 mg Oral q1800  . senna-docusate  2 tablet Oral QHS   Continuous Infusions: . sodium chloride Stopped (05/05/18 2047)  . sodium chloride 50 mL/hr at 05/06/18 0956  . amiodarone 30 mg/hr (05/06/18 0700)  . cefTRIAXone (ROCEPHIN)  IV 1 g (05/05/18 1732)  . magnesium sulfate 1 - 4 g bolus IVPB     PRN Meds: sodium chloride, albuterol, alum & mag hydroxide-simeth, bisacodyl, guaiFENesin-dextromethorphan, hydrALAZINE, magnesium sulfate 1 - 4 g bolus IVPB, morphine injection, ondansetron, oxyCODONE-acetaminophen, phenol, potassium chloride   Vital Signs    Vitals:   05/06/18 0800 05/06/18 0821 05/06/18 0823 05/06/18 0824  BP: 126/65     Pulse: 64     Resp: (!) 21     Temp:   97.6 F (36.4 C)   TempSrc:   Oral   SpO2: 96% 96%  98%  Weight:      Height:        Intake/Output Summary (Last 24 hours) at 05/06/2018 1100 Last data filed at 05/06/2018 0700 Gross per 24 hour  Intake 373.15 ml  Output 675 ml  Net -301.85 ml   Filed Weights   04/28/18 1603  05/01/18 0600  Weight: 69.9 kg 83.9 kg    Telemetry    NSR with bursts of atrial flutter with RVr - Personally Reviewed  ECG    No new EKG to review- Personally Reviewed  Physical Exam   GEN: No acute distress.   Neck: No JVD Cardiac: RRR, no murmurs, rubs, or gallops.  Respiratory: Clear to auscultation bilaterally. GI: Soft, nontender, non-distended  MS: No edema; No deformity. Neuro:  Nonfocal  Psych: Normal affect   Labs    Chemistry Recent Labs  Lab 05/03/18 0408 05/04/18 0358 05/06/18 0220  NA 138 138 133*  K 3.7 4.3 3.8  CL 103 102 93*  CO2 26 25 25   GLUCOSE 116* 119* 127*  BUN 17 36* 36*  CREATININE 0.76 1.22* 1.00  CALCIUM 9.7 9.5 9.4  PROT  --  5.5* 6.0*  ALBUMIN  --  2.4* 2.5*  AST  --  28 28  ALT  --  26 25  ALKPHOS  --  58 79  BILITOT  --  2.6* 3.5*  GFRNONAA >60 45* 57*  GFRAA >60 52* >60  ANIONGAP 9 11 15      Hematology Recent Labs  Lab 04/30/18 0413 05/02/18 0114 05/04/18 0358  WBC 11.2*  10.3 12.8*  RBC 4.58 4.26 4.66  HGB 13.5 12.8 13.4  HCT 41.3 38.9 41.8  MCV 90.2 91.3 89.7  MCH 29.5 30.0 28.8  MCHC 32.7 32.9 32.1  RDW 15.8* 14.9 15.1  PLT 50* 66* 179    Cardiac EnzymesNo results for input(s): TROPONINI in the last 168 hours. No results for input(s): TROPIPOC in the last 168 hours.   BNP Recent Labs  Lab 05/03/18 1004  BNP 275.0*     DDimer No results for input(s): DDIMER in the last 168 hours.   Radiology    Dg Chest Port 1 View  Result Date: 05/05/2018 CLINICAL DATA:  Respiratory failure.  Pneumonia. EXAM: PORTABLE CHEST 1 VIEW COMPARISON:  05/04/2018.  05/02/2018.  04/29/2018. FINDINGS: Patchy pulmonary density in the mid and lower lungs bilaterally persists consistent with mild bronchopneumonia and atelectasis. No worsening or new finding. IMPRESSION: Persistent mild patchy bilateral density consistent with atelectasis or pneumonia. No new finding. Electronically Signed   By: Paulina Fusi M.D.   On: 05/05/2018  08:18   Dg Abd Portable 1v  Result Date: 05/06/2018 CLINICAL DATA:  Abdominal distension. History of surgery past Friday. EXAM: PORTABLE ABDOMEN - 1 VIEW COMPARISON:  CT scan 04/28/2018 FINDINGS: Moderate air distended small bowel along with mild distention of the right and transverse colon. Findings are most likely due to a postoperative ileus but could not exclude an early or partial small bowel obstruction. Recommend correlation with bowel sounds and follow-up radiographs as indicated. No free air. IMPRESSION: Dilated bowel, mainly small-bowel. Postoperative ileus versus small-bowel obstruction. Recommend correlation with bowel sounds. Electronically Signed   By: Rudie Meyer M.D.   On: 05/06/2018 09:11    Cardiac Studies   2D echo 05/03/2018 IMPRESSIONS   1. The left ventricle has hyperdynamic systolic function of >65%. The cavity size is normal. There is no increased left ventricular wall thickness. Echo evidence of impaired diastolic relaxation.  2. The right ventricle has normal systolic function. The cavity in normal in size. There is no increase in right ventricular wall thickness.  3. The mitral valve is normal in structure.  4. The aortic valve has an indeterminant number of cusps.  5. The pulmonic valve is Not well seen. Pulmonic valve regurgitation was not assessed by color flow Doppler.  6. There is mild dilatation of the aortic root.  7. The interatrial septum was not assessed.  8. Extremely limited due to poor sound wave transmission; definity used; vigorous LV systolic function; mild diastolic dysfunction.  Patient Profile     73 y.o. female with a hx of HTN, HLD and COPD who is being seen today for the evaluation of atrial fibrillation after repair of ruptured AAA   Assessment & Plan     1.  Paroxysmal atrial fibrillation -This occurred in the setting of aspiration pneumonia and recovery from AAA repair -In review of telemetry over the past 24 hours she is maintaining  normal sinus rhythm with intermittent bursts of atrial flutter with RVR -Currently on Lopressor  IV 5 mg every 6 hours -increase to 5 mg every 4 hours due to breakthrough atrial flutter.  If BP becomes too soft for this dosing schedule then may need to increase IV amio back to 60 mg/h -Continue IV amio with as needed boluses if she has recurrent atrial fibrillation/flutter  2.  8.3 cm infrarenal AAA -Status post repair per vascular -Continue statin once taking p.o.  3.  Acute respiratory failure -Secondary  to aspiration pneumonia -Per  CCM  4.  Hypertension  -BP appears stable at 126/65 mmHg -Continue Lopressor IV  -Amlodipine on hold due to ileus -Titrate beta-blocker as needed for blood pressure control and use IV hydralazine as needed  4.  Hyperlipidemia -Continue statin therapy once taking p.o. -Check FLP in a.m.   I have spent a total of 40 minutes with patient reviewing hospital notes , telemetry, EKGs, labs and examining patient as well as establishing an assessment and plan that was discussed with the patient.  > 50% of time was spent in direct patient care.         For questions or updates, please contact [facility] Please consult [email or web address] for contact info under Cardiology/STEMI.      Signed, Armanda Magicraci Turner, MD  05/06/2018, 11:00 AM

## 2018-05-07 ENCOUNTER — Inpatient Hospital Stay (HOSPITAL_COMMUNITY): Payer: Medicare PPO

## 2018-05-07 DIAGNOSIS — K560 Paralytic ileus: Secondary | ICD-10-CM

## 2018-05-07 DIAGNOSIS — N179 Acute kidney failure, unspecified: Secondary | ICD-10-CM

## 2018-05-07 LAB — BASIC METABOLIC PANEL
Anion gap: 17 — ABNORMAL HIGH (ref 5–15)
Anion gap: 15 (ref 5–15)
BUN: 57 mg/dL — ABNORMAL HIGH (ref 8–23)
BUN: 61 mg/dL — ABNORMAL HIGH (ref 8–23)
CO2: 22 mmol/L (ref 22–32)
CO2: 24 mmol/L (ref 22–32)
CREATININE: 1.89 mg/dL — AB (ref 0.44–1.00)
Calcium: 9.4 mg/dL (ref 8.9–10.3)
Calcium: 9.5 mg/dL (ref 8.9–10.3)
Chloride: 94 mmol/L — ABNORMAL LOW (ref 98–111)
Chloride: 95 mmol/L — ABNORMAL LOW (ref 98–111)
Creatinine, Ser: 2 mg/dL — ABNORMAL HIGH (ref 0.44–1.00)
GFR calc Af Amer: 30 mL/min — ABNORMAL LOW (ref >60)
GFR calc Af Amer: 28 mL/min — ABNORMAL LOW (ref >60)
GFR calc non Af Amer: 26 mL/min — ABNORMAL LOW (ref >60)
GFR, EST NON AFRICAN AMERICAN: 24 mL/min — AB (ref >60)
GLUCOSE: 115 mg/dL — AB (ref 70–99)
Glucose, Bld: 102 mg/dL — ABNORMAL HIGH (ref 70–99)
Potassium: 6.2 mmol/L — ABNORMAL HIGH (ref 3.5–5.1)
Potassium: 5 mmol/L (ref 3.5–5.1)
Sodium: 133 mmol/L — ABNORMAL LOW (ref 135–145)
Sodium: 134 mmol/L — ABNORMAL LOW (ref 135–145)

## 2018-05-07 LAB — CBC
HCT: 43.5 % (ref 36.0–46.0)
Hemoglobin: 14.4 g/dL (ref 12.0–15.0)
MCH: 28.6 pg (ref 26.0–34.0)
MCHC: 33.1 g/dL (ref 30.0–36.0)
MCV: 86.5 fL (ref 80.0–100.0)
Platelets: 263 10*3/uL (ref 150–400)
RBC: 5.03 MIL/uL (ref 3.87–5.11)
RDW: 16.3 % — ABNORMAL HIGH (ref 11.5–15.5)
WBC: 15.3 10*3/uL — ABNORMAL HIGH (ref 4.0–10.5)
nRBC: 0 % (ref 0.0–0.2)

## 2018-05-07 LAB — BRAIN NATRIURETIC PEPTIDE: B Natriuretic Peptide: 86.8 pg/mL (ref 0.0–100.0)

## 2018-05-07 LAB — PHOSPHORUS: Phosphorus: 5.2 mg/dL — ABNORMAL HIGH (ref 2.5–4.6)

## 2018-05-07 LAB — MAGNESIUM: Magnesium: 2.6 mg/dL — ABNORMAL HIGH (ref 1.7–2.4)

## 2018-05-07 MED ORDER — SODIUM CHLORIDE 0.9% FLUSH: 10.0000 mL | INTRAVENOUS | Status: DC | PRN

## 2018-05-07 MED ORDER — DEXTROSE-NACL 5-0.45 % IV SOLN
INTRAVENOUS | Status: DC
  Administered 2018-05-07: 08:00:00 via INTRAVENOUS

## 2018-05-07 MED ORDER — SODIUM CHLORIDE 0.9% FLUSH
10.0000 mL | Freq: Two times a day (BID) | INTRAVENOUS | Status: DC
  Administered 2018-05-07: 40 mL
  Administered 2018-05-07 – 2018-05-12 (×9): 10 mL
  Administered 2018-05-14 – 2018-05-16 (×2): 30 mL
  Administered 2018-05-17 – 2018-05-18 (×2): 10 mL
  Administered 2018-05-18: 20 mL
  Administered 2018-05-19: 10 mL
  Administered 2018-05-19: 30 mL
  Administered 2018-05-20 – 2018-05-21 (×2): 10 mL
  Administered 2018-05-22: 20 mL
  Administered 2018-05-22 – 2018-05-23 (×2): 10 mL
  Administered 2018-05-23: 20 mL
  Administered 2018-05-24 – 2018-05-26 (×3): 10 mL

## 2018-05-07 MED ORDER — LEVALBUTEROL HCL 1.25 MG/0.5ML IN NEBU: 1.2500 mg | INHALATION_SOLUTION | Freq: Four times a day (QID) | RESPIRATORY_TRACT | Status: DC | PRN

## 2018-05-07 MED ORDER — METOPROLOL TARTRATE 5 MG/5ML IV SOLN
5.0000 mg | Freq: Four times a day (QID) | INTRAVENOUS | Status: DC
  Administered 2018-05-07: 5 mg via INTRAVENOUS
  Filled 2018-05-07: qty 5

## 2018-05-07 MED ORDER — DEXTROSE-NACL 5-0.9 % IV SOLN
INTRAVENOUS | Status: AC
  Administered 2018-05-07 – 2018-05-08 (×3): via INTRAVENOUS
  Administered 2018-05-08: 125 mL/h via INTRAVENOUS
  Administered 2018-05-08 – 2018-05-11 (×10): via INTRAVENOUS

## 2018-05-07 MED ORDER — SODIUM CHLORIDE 0.9 % IV BOLUS
1000.0000 mL | Freq: Once | INTRAVENOUS | Status: AC
  Administered 2018-05-07: 1000 mL via INTRAVENOUS

## 2018-05-07 NOTE — Progress Notes (Addendum)
Progress Note  Patient Name: Leah Mata Date of Encounter: 05/07/2018  Primary Cardiologist: No primary care provider on file.   Subjective   Remains in NSR on tele this am.  Had a run of atrial flutter with RVR around 00:30am but since then only frequent PACs and what appears to be multifocal atrial rhythm at times with short bursts of atrial tachycardia.    Inpatient Medications    Scheduled Meds: . chlorhexidine  15 mL Mouth Rinse BID  . docusate sodium  100 mg Oral Daily  . fluticasone  2 spray Each Nare Daily  . heparin  5,000 Units Subcutaneous Q8H  . ipratropium-albuterol  3 mL Nebulization Q6H  . lactulose  30 g Oral BID  . mouth rinse  15 mL Mouth Rinse q12n4p  . metoCLOPramide (REGLAN) injection  10 mg Intravenous Q8H  . metoprolol tartrate  5 mg Intravenous Q4H  . pantoprazole (PROTONIX) IV  40 mg Intravenous Q12H  . polyethylene glycol  17 g Oral Daily  . senna-docusate  2 tablet Oral QHS  . sodium chloride flush  10-40 mL Intracatheter Q12H   Continuous Infusions: . sodium chloride Stopped (05/05/18 2047)  . amiodarone 30 mg/hr (05/07/18 0700)  . cefTRIAXone (ROCEPHIN)  IV Stopped (05/06/18 1818)  . dextrose 5 % and 0.9% NaCl    . magnesium sulfate 1 - 4 g bolus IVPB     PRN Meds: sodium chloride, albuterol, alum & mag hydroxide-simeth, bisacodyl, guaiFENesin-dextromethorphan, hydrALAZINE, magnesium sulfate 1 - 4 g bolus IVPB, morphine injection, ondansetron, oxyCODONE-acetaminophen, phenol, potassium chloride, sodium chloride flush   Vital Signs    Vitals:   05/07/18 0740 05/07/18 0923 05/07/18 0924 05/07/18 0928  BP:      Pulse:      Resp:      Temp: 98.5 F (36.9 C)     TempSrc: Oral     SpO2:  93% 96% 99%  Weight:      Height:        Intake/Output Summary (Last 24 hours) at 05/07/2018 1117 Last data filed at 05/07/2018 0700 Gross per 24 hour  Intake 1472.6 ml  Output 100 ml  Net 1372.6 ml   Filed Weights   04/28/18 1603 05/01/18 0600    Weight: 69.9 kg 83.9 kg    Telemetry   NSR with frequent PACs and PVCs. Had a run of atrial flutter with RVR around 00:30am but since then only frequent PACs and what appears to be multifocal atrial rhythm at times with short bursts of atrial tachycardia.  - Personally Reviewed  ECG    No new EKG to review- Personally Reviewed  Physical Exam   GEN: Well nourished, well developed in no acute distress HEENT: Normal NECK: No JVD; No carotid bruits LYMPHATICS: No lymphadenopathy CARDIAC:RRR, no murmurs, rubs, gallops RESPIRATORY:  Clear to auscultation without rales, wheezing or rhonchi  ABDOMEN: Soft, non-tender, non-distended MUSCULOSKELETAL:  No edema; No deformity  SKIN: Warm and dry NEUROLOGIC:  Alert and oriented x 3 PSYCHIATRIC:  Normal affect    Labs    Chemistry Recent Labs  Lab 05/04/18 0358 05/06/18 0220 05/07/18 0234 05/07/18 0636  NA 138 133* 133* 134*  K 4.3 3.8 6.2* 5.0  CL 102 93* 94* 95*  CO2 25 25 22 24   GLUCOSE 119* 127* 102* 115*  BUN 36* 36* 57* 61*  CREATININE 1.22* 1.00 1.89* 2.00*  CALCIUM 9.5 9.4 9.4 9.5  PROT 5.5* 6.0*  --   --   ALBUMIN  2.4* 2.5*  --   --   AST 28 28  --   --   ALT 26 25  --   --   ALKPHOS 58 79  --   --   BILITOT 2.6* 3.5*  --   --   GFRNONAA 45* 57* 26* 24*  GFRAA 52* >60 30* 28*  ANIONGAP 11 15 17* 15     Hematology Recent Labs  Lab 05/02/18 0114 05/04/18 0358 05/07/18 0234  WBC 10.3 12.8* 15.3*  RBC 4.26 4.66 5.03  HGB 12.8 13.4 14.4  HCT 38.9 41.8 43.5  MCV 91.3 89.7 86.5  MCH 30.0 28.8 28.6  MCHC 32.9 32.1 33.1  RDW 14.9 15.1 16.3*  PLT 66* 179 263    Cardiac EnzymesNo results for input(s): TROPONINI in the last 168 hours. No results for input(s): TROPIPOC in the last 168 hours.   BNP Recent Labs  Lab 05/03/18 1004  BNP 275.0*     DDimer No results for input(s): DDIMER in the last 168 hours.   Radiology    Dg Abd Portable 1v  Result Date: 05/06/2018 CLINICAL DATA:  Vomiting EXAM:  PORTABLE ABDOMEN - 1 VIEW COMPARISON:  May 26, 2018 study obtained earlier in the day FINDINGS: Nasogastric tube tip and side port are in the stomach. There remain multiple loops of dilated bowel, similar to earlier in the day. Is no appreciable free air. There are skin staples present. Lung bases are clear. IMPRESSION: Persistent bowel dilatation. Skin staples present. Suspect postoperative ileus as most likely. A degree of developing bowel obstruction cannot be excluded by radiography, however. No free air demonstrable. Lung bases clear. Nasogastric tube tip and side port in stomach. Electronically Signed   By: Bretta Bang III M.D.   On: 05/06/2018 11:40   Dg Abd Portable 1v  Result Date: 05/06/2018 CLINICAL DATA:  Abdominal distension. History of surgery past Friday. EXAM: PORTABLE ABDOMEN - 1 VIEW COMPARISON:  CT scan 04/28/2018 FINDINGS: Moderate air distended small bowel along with mild distention of the right and transverse colon. Findings are most likely due to a postoperative ileus but could not exclude an early or partial small bowel obstruction. Recommend correlation with bowel sounds and follow-up radiographs as indicated. No free air. IMPRESSION: Dilated bowel, mainly small-bowel. Postoperative ileus versus small-bowel obstruction. Recommend correlation with bowel sounds. Electronically Signed   By: Rudie Meyer M.D.   On: 05/06/2018 09:11    Cardiac Studies   2D echo 05/03/2018 IMPRESSIONS   1. The left ventricle has hyperdynamic systolic function of >65%. The cavity size is normal. There is no increased left ventricular wall thickness. Echo evidence of impaired diastolic relaxation.  2. The right ventricle has normal systolic function. The cavity in normal in size. There is no increase in right ventricular wall thickness.  3. The mitral valve is normal in structure.  4. The aortic valve has an indeterminant number of cusps.  5. The pulmonic valve is Not well seen. Pulmonic  valve regurgitation was not assessed by color flow Doppler.  6. There is mild dilatation of the aortic root.  7. The interatrial septum was not assessed.  8. Extremely limited due to poor sound wave transmission; definity used; vigorous LV systolic function; mild diastolic dysfunction.  Patient Profile     73 y.o. female with a hx of HTN, HLD and COPD who is being seen today for the evaluation of atrial fibrillation after repair of ruptured AAA   Assessment & Plan  1.  Paroxysmal atrial fibrillation -This occurred in the setting of aspiration pneumonia and recovery from AAA repair -In review of telemetry over the past 24 hours she is maintaining normal sinus rhythm  Had a run of atrial flutter with RVR around 00:30am but since then only frequent PACs and what appears to be multifocal atrial rhythm at times with short bursts of atrial tachycardia.   -decrease Lopressor to 5 mg every 6 hours since she got bradycardic yesterday and BP dropped (dehydrated)  -Continue IV amio with as needed boluses if she has recurrent atrial fibrillation/flutter -recommend changing albuterol to Xopenex nebs.  2.  8.3 cm infrarenal AAA -Status post repair per vascular -Continue statin once taking p.o.  3.  Acute respiratory failure -Secondary  to aspiration pneumonia -Per CCM  4.  Hypertension  -BP controlled at 118/3960mmHg -Continue Lopressor IV  -Amlodipine on hold due to ileus -Titrate beta-blocker as needed for blood pressure control and use IV hydralazine as needed  4.  Hyperlipidemia -Continue statin therapy once taking p.o. -Check FLP in a.m.   I have spent a total of 35 minutes with patient reviewing notes , telemetry, EKGs, labs and examining patient as well as establishing an assessment and plan that was discussed with the patient.  > 50% of time was spent in direct patient care.    For questions or updates, please contact [facility] Please consult [email or web address] for contact info  under Cardiology/STEMI.      Signed, Armanda Magicraci Kaneesha Constantino, MD  05/07/2018, 11:17 AM

## 2018-05-07 NOTE — Progress Notes (Signed)
Oxygen switched to nasal canula from high flow [place].

## 2018-05-07 NOTE — Progress Notes (Signed)
73 year old woman with emergent ruptured AAA repair on 1/31, extubated 2/1.   Postop course complicated by acute hypoxic respiratory failure with bilateral lower lobe infiltrates and effusions requiring BiPAP Now she has developed ileus with AKI 2/8 NG was placed to low intermittent suction.  She has a couple of episodes of vomiting overnight, complains of abdominal pain. Belly feels tense, is not passing flatus.  Afebrile, able to lie supine, on 3 L nasal cannula, decreased breath sounds bilateral, S1-S2 normal, on amiodarone drip.  Distended abdomen without bowel sounds.  X-ray KUB personally reviewed which shows dilated small bowel and large bowel loops in a pattern consistent with ileus although obstruction cannot be definitively ruled out.  Labs show worsening creatinine to 2.0, potassium back down to 5, mild leukocytosis, lactate 1.3.  Impression/plan  Ileus-seems to be the main issue, mobilize out of bed, continue Reglan for 6 doses, NG tube to LIS, will consider CT abdomen if does not resolve in the next 24 hours  AKI -related to third spacing, will increase fluids to 125 an hour  Acute hypoxic respiratory failure, bibasilar pneumonia-resolving , continue empiric ceftriaxone  Paroxysmal atrial fibrillation-related to above complications, holding off on IV heparin, continue amnio drip  Patient appears little depressed and frustrated at lack of progress Son updated at bedside  The patient is critically ill with multiple organ systems failure and requires high complexity decision making for assessment and support, frequent evaluation and titration of therapies, application of advanced monitoring technologies and extensive interpretation of multiple databases. Critical Care Time devoted to patient care services described in this note independent of APP/resident  time is 32 minutes.   Comer Locket Vassie Loll MD

## 2018-05-07 NOTE — Progress Notes (Signed)
eLink Physician-Brief Progress Note Patient Name: Leah JewelLinda D Mata DOB: 09/20/1945 MRN: [id number]   Date of Service  05/07/2018  HPI/Events of Note  K+ = 6.2. EKG without tall, peaked T waves or widened QRS.   eICU Interventions  Will repeat BMP STAT.      Intervention Category Major Interventions: Electrolyte abnormality - evaluation and management  Shamra Bradeen Eugene 05/07/2018, 4:30 AM

## 2018-05-07 NOTE — Progress Notes (Addendum)
[facility]   Subjective  - NG tube placed.  AKI now.    Objective 118/60 67 98.5 F (36.9 C) (Oral) 18 99%  Intake/Output Summary (Last 24 hours) at 05/07/2018 1020 Last data filed at 05/07/2018 0700 Gross per 24 hour  Intake 1535.96 ml  Output 100 ml  Net 1435.96 ml    R DP palpable, L PT palpable Abdomen distended, slightly softer, no rebound or guarding NG with bilious output   Laboratory Lab Results: Recent Labs    05/07/18 0234  WBC 15.3*  HGB 14.4  HCT 43.5  PLT 263   BMET Recent Labs    05/07/18 0234 05/07/18 0636  NA 133* 134*  K 6.2* 5.0  CL 94* 95*  CO2 22 24  GLUCOSE 102* 115*  BUN 57* 61*  CREATININE 1.89* 2.00*  CALCIUM 9.4 9.5    COAG Lab Results  Component Value Date   INR 1.16 04/30/2018   INR 1.33 04/29/2018   INR 1.47 04/29/2018   No results found for: PTT  Assessment/Planning: S/P open repair of ruptured AAA.  NG placed yesterday for suspected ileus - bilious emesis with dilated small bowel and colon on KUB.  NG output 1100 mL+ in past 24 hours and bilious.  Abdomen remains distended but no significant pain.  Now with AKI and Cr 2.0. Will increased IVF to 125 mL and keep NPO, NG to ILWS.   May require additional fluid resuscitation today - suspect pre-renal from ileus.  Repeat KUB.  Oxygen weaned to 3L [place].  Keep in ICU for now.  Good pedal pulses distally.  Cephus Shelling 05/07/2018 10:20 AM --

## 2018-05-08 ENCOUNTER — Inpatient Hospital Stay (HOSPITAL_COMMUNITY): Payer: Medicare PPO

## 2018-05-08 DIAGNOSIS — J9600 Acute respiratory failure, unspecified whether with hypoxia or hypercapnia: Secondary | ICD-10-CM

## 2018-05-08 DIAGNOSIS — K567 Ileus, unspecified: Secondary | ICD-10-CM

## 2018-05-08 DIAGNOSIS — E7800 Pure hypercholesterolemia, unspecified: Secondary | ICD-10-CM

## 2018-05-08 LAB — BASIC METABOLIC PANEL
Anion gap: 10 (ref 5–15)
BUN: 64 mg/dL — ABNORMAL HIGH (ref 8–23)
CO2: 25 mmol/L (ref 22–32)
Calcium: 8.9 mg/dL (ref 8.9–10.3)
Chloride: 100 mmol/L (ref 98–111)
Creatinine, Ser: 2.05 mg/dL — ABNORMAL HIGH (ref 0.44–1.00)
GFR calc Af Amer: 28 mL/min — ABNORMAL LOW (ref >60)
GFR calc non Af Amer: 24 mL/min — ABNORMAL LOW (ref >60)
Glucose, Bld: 151 mg/dL — ABNORMAL HIGH (ref 70–99)
Potassium: 3.6 mmol/L (ref 3.5–5.1)
Sodium: 135 mmol/L (ref 135–145)

## 2018-05-08 LAB — CBC
HCT: 37.2 % (ref 36.0–46.0)
Hemoglobin: 12.9 g/dL (ref 12.0–15.0)
MCH: 29.7 pg (ref 26.0–34.0)
MCHC: 34.7 g/dL (ref 30.0–36.0)
MCV: 85.7 fL (ref 80.0–100.0)
Platelets: 222 10*3/uL (ref 150–400)
RBC: 4.34 MIL/uL (ref 3.87–5.11)
RDW: 16.1 % — ABNORMAL HIGH (ref 11.5–15.5)
WBC: 14.5 10*3/uL — ABNORMAL HIGH (ref 4.0–10.5)
nRBC: 0 % (ref 0.0–0.2)

## 2018-05-08 LAB — MAGNESIUM: Magnesium: 2.4 mg/dL (ref 1.7–2.4)

## 2018-05-08 LAB — PHOSPHORUS: Phosphorus: 3.9 mg/dL (ref 2.5–4.6)

## 2018-05-08 LAB — SODIUM, URINE, RANDOM: Sodium, Ur: <10 mmol/L

## 2018-05-08 MED ORDER — METOPROLOL TARTRATE 5 MG/5ML IV SOLN
2.5000 mg | Freq: Four times a day (QID) | INTRAVENOUS | Status: DC
  Administered 2018-05-08 – 2018-05-11 (×13): 2.5 mg via INTRAVENOUS
  Filled 2018-05-08 (×13): qty 5

## 2018-05-08 MED FILL — Fentanyl Citrate Preservative Free (PF) Inj 100 MCG/2ML: INTRAMUSCULAR | Qty: 2 | Status: AC

## 2018-05-08 NOTE — Progress Notes (Signed)
Spoke with Dr. Arbie Cookey and made MD aware that two different nurses attempted NG tube placement after initial NG tube came out inadvertently.  During attempts to insert NG tube it kept coiling in back of throat and then with second nurse it would go down but patient would cough and could not verify auscultation therefore thought to be in lung.  MD stated to not attempt insertion again today but to retry again tomorrow.

## 2018-05-08 NOTE — Progress Notes (Signed)
Physical Therapy Treatment Patient Details Name: Leah Mata MRN: [id number] DOB: 24-Dec-1945 Today's Date: 05/08/2018    History of Present Illness Pt is a 73 y.o. F with significant PMH of COPD who presents with abdominal and right flank pain. 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 S/p aneurysm abdominal aortic repair. Hospitalization complicated by worsening SOB requiring bipap on 2/4 due to pulmonary effusion, tachycardia 2/5 requiring amiodorone drip.    PT Comments    Pt progressing with ambulation distance and tolerance. Ambulated 155' with RW and min A +2 for equipment with SpO2 in low 80's to mid 90's with supplemental O2 between 3L and 6L. PT will continue to follow.    Follow Up Recommendations  CIR     Equipment Recommendations  Rolling walker with 5" wheels;3in1 (PT)    Recommendations for Other Services       Precautions / Restrictions Precautions Precautions: Fall Precaution Comments: watch HR and 02; NG tube Restrictions Weight Bearing Restrictions: No    Mobility  Bed Mobility Overal bed mobility: Needs Assistance             General bed mobility comments: In recliner upon arrival  Transfers Overall transfer level: Needs assistance Equipment used: Rolling walker (2 wheeled) Transfers: Sit to/from Stand Sit to Stand: Min assist;+2 safety/equipment         General transfer comment: Min A +2 for safety  Ambulation/Gait Ambulation/Gait assistance: Min Chemical engineer (Feet): 155 Feet Assistive device: Rolling walker (2 wheeled) Gait Pattern/deviations: Step-through pattern;Decreased stride length;Shuffle;Drifts right/left Gait velocity: decreased Gait velocity interpretation: <1.31 ft/sec, indicative of household ambulator General Gait Details: pt began on 3L O2 but increased to 4L as she got into hallway and sats down to 89%. Increased to 6L after halfway due to sats  low 80's. Took 4 standing rest breaks. Returned to 3L O2 after return to sitting   Stairs             Wheelchair Mobility    Modified Rankin (Stroke Patients Only)       Balance Overall balance assessment: Needs assistance Sitting-balance support: No upper extremity supported;Feet supported Sitting balance-[name]: Fair     Standing balance support: Bilateral upper extremity supported Standing balance-[name]: Poor Standing balance comment: reliant on B UE support in standing                            Cognition Arousal/Alertness: Awake/alert Behavior During Therapy: Flat affect Overall Cognitive Status: Within Functional Limits for tasks assessed                                        Exercises General Exercises - Lower Extremity Ankle Circles/Pumps: AROM;Both;10 reps;Seated Straight Leg Raises: AAROM;Both;5 reps;Seated    General Comments General comments (skin integrity, edema, etc.): SpO2 ranging from 80s-low 90s. Unsure of reliability of SpO2 with poor wave pattern      Pertinent Vitals/Pain Pain Assessment: Faces Faces Pain Scale: No hurt Pain Location: abdominal incision Pain Descriptors / Indicators: Grimacing;Guarding Pain Intervention(s): Monitored during session;Limited activity within patient's tolerance;Repositioned    Home Living                      Prior Function  PT Goals (current goals can now be found in the care plan section) Acute Rehab PT Goals Patient Stated Goal: "Go home" PT Goal Formulation: With patient Time For Goal Achievement: 05/14/18 Potential to Achieve Goals: Good Progress towards PT goals: Progressing toward goals    Frequency    Min 3X/week      PT Plan Current plan remains appropriate    Co-evaluation PT/OT/SLP Co-Evaluation/Treatment: Yes Reason for Co-Treatment: Complexity of the patient's impairments (multi-system involvement);For  patient/therapist safety;Necessary to address cognition/behavior during functional activity PT goals addressed during session: Mobility/safety with mobility;Balance;Proper use of DME;Strengthening/ROM OT goals addressed during session: ADL's and self-care      AM-PAC PT "6 Clicks" Mobility   Outcome Measure  Help needed turning from your back to your side while in a flat bed without using bedrails?: A Lot Help needed moving from lying on your back to sitting on the side of a flat bed without using bedrails?: A Lot Help needed moving to and from a bed to a chair (including a wheelchair)?: A Little Help needed standing up from a chair using your arms (e.g., wheelchair or bedside chair)?: A Little Help needed to walk in hospital room?: A Little Help needed climbing 3-5 steps with a railing? : A Lot 6 Click Score: 15    End of Session Equipment Utilized During Treatment: Oxygen;Gait belt Activity Tolerance: Patient limited by fatigue Patient left: in chair;with call bell/phone within reach;with family/visitor present(son came in after pt finished walk) Nurse Communication: Mobility status PT Visit Diagnosis: Unsteadiness on feet (R26.81);Pain;Difficulty in walking, not elsewhere classified (R26.2)     Time: 6015-6153 PT Time Calculation (min) (ACUTE ONLY): 24 min  Charges:  $Gait Training: 8-22 mins                     Lyanne Co, PT  Acute Rehab Services  Pager [phone] Office [phone]    Lawana Chambers Alexius Ellington 05/08/2018, 2:28 PM

## 2018-05-08 NOTE — Progress Notes (Signed)
Occupational Therapy Treatment Patient Details Name: NIZHONI TRUONG MRN: [id number] DOB: 02-27-1946 Today's Date: 05/08/2018    History of present illness Pt is a 73 y.o. F with significant PMH of COPD who presents with abdominal and right flank pain. 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 S/p aneurysm abdominal aortic repair. Hospitalization complicated by worsening SOB requiring bipap on 2/4 due to pulmonary effusion, tachycardia 2/5 requiring amiodorone drip.   OT comments  Pt progressing towards established OT goals and is motivated to participate in therapy. Pt currently Min A for functional mobility with RW. Requiring several standing rest breaks. Pt continues to reports tightness and pain at abdomen limiting her performance of LB ADLs. Continue to recommend dc to CIR and will continue to follow acutely as admitted.    Follow Up Recommendations  CIR    Equipment Recommendations  Tub/shower seat    Recommendations for Other Services PT consult    Precautions / Restrictions Precautions Precautions: Fall Precaution Comments: watch HR and 02; NG tube Restrictions Weight Bearing Restrictions: No       Mobility Bed Mobility Overal bed mobility: Needs Assistance             General bed mobility comments: In recliner upon arrival  Transfers Overall transfer level: Needs assistance Equipment used: Rolling walker (2 wheeled) Transfers: Sit to/from Stand Sit to Stand: Min assist;+2 safety/equipment         General transfer comment: Min A +2 for safety    Balance Overall balance assessment: Needs assistance Sitting-balance support: No upper extremity supported;Feet supported Sitting balance-[name]: Fair     Standing balance support: Bilateral upper extremity supported Standing balance-[name]: Poor Standing balance comment: reliant on B UE support in standing                            ADL either performed or assessed with clinical judgement   ADL Overall ADL's : Needs assistance/impaired Eating/Feeding: NPO               Upper Body Dressing : Minimal assistance;Sitting Upper Body Dressing Details (indicate cue type and reason): Min A for donning second gown     Toilet Transfer: Minimal assistance;+2 for physical assistance;+2 for safety/equipment;Ambulation;RW(Simulated to recliner) Statistician Details (indicate cue type and reason): Min A for safety         Functional mobility during ADLs: Minimal assistance;Rolling walker;+2 for safety/equipment General ADL Comments: Continues to be motivated to participate in therapy     Vision       Perception     Praxis      Cognition Arousal/Alertness: Awake/alert Behavior During Therapy: Flat affect Overall Cognitive Status: Within Functional Limits for tasks assessed                                          Exercises     Shoulder Instructions       General Comments SpO2 ranging from 80s-low 90s. Unsure of reliability of SpO2 with poor wave pattern    Pertinent Vitals/ Pain       Pain Assessment: Faces Faces Pain Scale: No hurt Pain Location: abdominal incision Pain Descriptors / Indicators: Grimacing;Guarding Pain Intervention(s): Monitored during session;Limited activity within patient's tolerance;Repositioned  Home Living  Prior Functioning/Environment              Frequency  Min 2X/week        Progress Toward Goals  OT Goals(current goals can now be found in the care plan section)  Progress towards OT goals: Progressing toward goals  Acute Rehab OT Goals Patient Stated Goal: "Go home" OT Goal Formulation: With patient Time For Goal Achievement: 05/14/18 Potential to Achieve Goals: Good ADL Goals Pt Will Perform Upper Body Dressing: with modified independence;sitting Pt Will Perform  Lower Body Dressing: with modified independence;sit to/from stand;with adaptive equipment Pt Will Transfer to Toilet: with modified independence;ambulating;bedside commode Pt Will Perform Toileting - Clothing Manipulation and hygiene: with modified independence;sit to/from stand;sitting/lateral leans Pt Will Perform Tub/Shower Transfer: Tub transfer;shower seat;ambulating;rolling walker;with min guard assist Additional ADL Goal #1: Pt will be able to perform bed mobility with supervision in preparation for ADLs  Plan Discharge plan needs to be updated    Co-evaluation    PT/OT/SLP Co-Evaluation/Treatment: Yes Reason for Co-Treatment: Complexity of the patient's impairments (multi-system involvement);For patient/therapist safety;To address functional/ADL transfers   OT goals addressed during session: ADL's and self-care      AM-PAC OT "6 Clicks" Daily Activity     Outcome Measure   Help from another person eating meals?: None Help from another person taking care of personal grooming?: A Little Help from another person toileting, which includes using toliet, bedpan, or urinal?: A Lot Help from another person bathing (including washing, rinsing, drying)?: A Lot Help from another person to put on and taking off regular upper body clothing?: A Little Help from another person to put on and taking off regular lower body clothing?: A Lot 6 Click Score: 16    End of Session Equipment Utilized During Treatment: Gait belt;Oxygen;Rolling walker  OT Visit Diagnosis: Unsteadiness on feet (R26.81);Other abnormalities of gait and mobility (R26.89);Muscle weakness (generalized) (M62.81);Pain Pain - part of body: (Abdoman)   Activity Tolerance Patient tolerated treatment well   Patient Left in chair;with call bell/phone within reach;with family/visitor present;with nursing/sitter in room   Nurse Communication Mobility status        Time: 5974-1638 OT Time Calculation (min): 26  min  Charges: OT General Charges $OT Visit: 1 Visit OT Treatments $Self Care/Home Management : 8-22 mins  Elizbeth Posa MSOT, OTR/L Acute Rehab Pager: [phone] Office: [phone]   Theodoro Grist Jakie Debow 05/08/2018, 2:02 PM

## 2018-05-08 NOTE — Progress Notes (Signed)
NAME:  Leah Mata, MRN:  [id number], DOB:  04-Jul-1945, LOS: 10 ADMISSION DATE:  04/28/2018, CONSULTATION DATE:  04/29/2018 REFERRING MD:  Dr. Arbie Cookey, CHIEF COMPLAINT:  AAA  Brief History   69 yoF presenting with acute onset of RLQ pain w/N/V and radiation to back with hypotension found to have ruptured AAA.  Taken to emergently to OR  Past Medical History  Current smoker, COPD, HTN, and HLD   Significant Hospital Events   1/31 Admit-> OR 2/1 extubated 2/2: Pulmonary signed off.  Recommending aggressive pulmonary hygiene measures 2/4:Developing worsening shortness of breath had worsening blood pressure, worsening pulmonary edema and right greater than left pleural effusions.  Blood pressure management escalated, diuretics ordered, placed on BiPAP. 2/5: Pulmonary formally reconsulted, diuretics continued, beta-blockade added given tachycardia, ordered echocardiogram.  Had atrial fibrillation with rapid ventricular response ultimately ended up on amiodarone infusion.  Ultrasound of right chest demonstrated more consolidation and very minimal effusion, started on ceftriaxone for possible aspiration pneumonia 2/6: Chest x-ray clearing.  Oxygen requirements seem to be improving.  Reporting constipation, addressing this with MiraLAX and laxative of choice as well as stool softener.  Holding diuretics due to creatinine bump.  Doppler review shows diastolic dysfunction. 2/7: X-ray continues to improve slowly.  Had acute episode of shortness of breath and upper airway wheezing following refluxing of orange juice.  Suspecting upper airway irritation a large contributing factor to her symptoms.  Still on amiodarone but planning on likely stopping in the 24 hours still has significant constipation Consults:   Procedures:  1/31 OR-> AAA repair 1/31 ETT >> 2/1  Significant Diagnostic Tests:  1/31 >> CT abd/ pelvis >>8.3 cm infrarenal abdominal aortic aneurysm with large right retroperitoneal hematoma most  compatible with aortic rupture. 2/5: Echocardiogram>>> hyperdynamic left ventricle EF greater than 65% abnormal diastolic relaxation no wall motion abnormality Micro Data:  None Antimicrobials:  1/31 Cefazolin preop 2/5: Ceftriaxone Interim history/subjective:  Sitting in chair doing much better but still complains of abdominal fullness and has ileus   Objective   Blood pressure (!) 123/59, pulse 68, temperature (!) 97.5 F (36.4 C), temperature source Oral, resp. rate (!) 23, height 5\' 4"  (1.626 m), weight 83.9 kg, last menstrual period 03/29/1993, SpO2 94 %.        Intake/Output Summary (Last 24 hours) at 05/08/2018 1018 Last data filed at 05/08/2018 1000 Gross per 24 hour  Intake 3841.5 ml  Output 1430 ml  Net 2411.5 ml   Filed Weights   04/28/18 1603 05/01/18 0600  Weight: 69.9 kg 83.9 kg   Examination: General: Well-nourished well-developed female sitting in chair with no acute distress complains of abdominal fullness. HEENT: No lymphadenopathy or JVD is appreciated Neuro: Intact follows commands moves all extremities CV: Sounds are regular with frequent pauses, currently on amiodarone via IV as her gout is not available PULM: even/non-labored, lungs bilaterally  Diminished in bases GI: Mild abdominal distention and pain no bowel sounds currently with NG tube suction with green bile drainage Extremities: warm/dry, 1+ edema  Skin: no rashes or lesions   Resolved Hospital Problem list   Acute blood loss anemia Acute kidney injury  Assessment & Plan:   Acute on chronic hypoxic respiratory failure In the setting of presumed aspiration pneumonia (no organism specified), complicated by element of volume overload with small pleural effusion Possible underlying obstructive lung disease Long history of tobacco abuse Suspected bibasilar pneumonia  Plan O2 as needed BiPAP PRN Pulmonary toilet Ileus is impacting on her breathing  status D5 of Rocephin consider  discontinuing after 7-day Cxr reviewed with bilat opacity bases   Atrial fibrillation with rapid ventricular response w/ CHAD2VASC of 5  Paroxysmal atrial fibrillation -She now has a controlled ventricular rhythm.  She is in and out of atrial fibrillation this morning as of 2/7 Plan Remains on amiodarone Not getting Lopressor due to low blood pressure Pains in a sinus rhythm with ectopics  Hypertension with diastolic dysfunction by echocardiogram on 2/5 With hypotension over the last 24 to 48 hours currently off antihypertensive  -Systolic blood pressure adequate but has been on the low side -Intermittent atrial fibrillation Plan Antihypertensives currently on hold She has PRN hydralazine as needed   Ruptured infrarenal AAA s/p open repair with EBL ~4L Lab Results  Component Value Date   CREATININE 2.05 (H) 05/08/2018   CREATININE 2.00 (H) 05/07/2018   CREATININE 1.89 (H) 05/07/2018    Plan Postoperative management per vascular surgery continuing blood pressure control At risk for fluid and electrolyte imbalance: With mild AKI following attempted increase in diuretics on 2/5 Plan Monitor creatinine Avoid nephrotoxins Holding diuresis due to low blood pressure  Constipation: Ileus Plan NG tube to suction Continue bowel regimen  acute blood loss anemia w/ ebl 4.2 liters.  Recent Labs    05/07/18 0234 05/08/18 0321  HGB 14.4 12.9   -Hemoglobin is remained stable over the last 72 hours Plan Monitor hemoglobin  Best practice:  Diet: N.p.o. for ileus with NG tube to suction Pain/Anxiety/Delirium NA VAP protocol (if indicated): dc DVT prophylaxis: heparin SQ GI prophylaxis: protonix Glucose control: SSI  Mobility:OOB Code Status: Full  Family Communication: 05/08/2018 family updated at bedside along with patient Disposition: Needs intensive care unit.  Progressing from AAA repair but has an ileus requiring NG to suction.   Brett Canales Harnoor Kohles ACNP [facility] Pollack  PCCM Pager [phone] till 1 pm If no answer page [phone] 05/08/2018, 10:19 AM

## 2018-05-08 NOTE — Progress Notes (Signed)
SLP Cancellation Note  Patient Details Name: Leah Mata MRN: [id number] DOB: [birth year]   Cancelled treatment:       Reason Eval/Treat Not Completed: Medical issues which prohibited therapy - pt now NPO due to concern for ileus. Will f/u as able to consume POs.    Virl Axe Clemons Salvucci 05/08/2018, 8:08 AM  Maxcine Ham Mikhaila Roh, M.A. CCC-SLP Acute Herbalist [phone] Office [phone]

## 2018-05-08 NOTE — Progress Notes (Signed)
Subjective: Interval History: none.. Comfortable this morning.  Alert and oriented.  Her daughter is present in the room with her.  Denies any abdominal pain.  Reports flatus.  Some nausea  Objective: Vital signs in last 24 hours: Temp:  [98.2 F (36.8 C)-98.7 F (37.1 C)] 98.3 F (36.8 C) (02/10 0400) Pulse Rate:  [68] 68 (02/09 1927) Resp:  [13-24] 15 (02/10 0700) BP: (108-147)/(52-74) 122/58 (02/10 0700) SpO2:  [84 %-99 %] 94 % (02/10 0700)  Intake/Output from previous day: 02/09 0701 - 02/10 0700 In: 3649.1 [I.V.:3058.1; NG/GT:591] Out: 1530 [Urine:280; Emesis/NG output:1200; Stool:50] Intake/Output this shift: No intake/output data recorded.  Abdomen moderately distended.  Completely soft.  Nontender to palpation or percussion or shaking.  No bowel sounds heard.  Lab Results: Recent Labs    05/07/18 0234 05/08/18 0321  WBC 15.3* 14.5*  HGB 14.4 12.9  HCT 43.5 37.2  PLT 263 222   BMET Recent Labs    05/07/18 0636 05/08/18 0321  NA 134* 135  K 5.0 3.6  CL 95* 100  CO2 24 25  GLUCOSE 115* 151*  BUN 61* 64*  CREATININE 2.00* 2.05*  CALCIUM 9.5 8.9    Studies/Results: Ct Abdomen Pelvis Wo Contrast  Result Date: 05/07/2018 CLINICAL DATA:  Abdominal distention. Bowel obstruction. Abdominal aortic aneurysm repair on 04/28/2018 EXAM: CT ABDOMEN AND PELVIS WITHOUT CONTRAST TECHNIQUE: Multidetector CT imaging of the abdomen and pelvis was performed following the standard protocol without IV contrast. COMPARISON:  04/28/2018 CT scan FINDINGS: Lower chest: Small bilateral pleural effusions with passive atelectasis. There some additional airspace opacity in the right lower lobe which may represent additional atelectasis or pneumonia. Right coronary artery atherosclerotic calcification. Hepatobiliary: Stable small hypodense hepatic lesions, likely cysts or similar benign structures. Contrast medium gallbladder probably from vicarious excretion. No biliary dilatation. Pancreas:  Stable atrophic appearance of the pancreas. Spleen: Unremarkable Adrenals/Urinary Tract: Both adrenal glands appear within normal limits. Modest increase in size of the right perirenal and retroperitoneal hematoma, current estimate at about 8.9 by 10.0 by 17.9 cm (volume = 830 cm^3), formerly about 8.7 by 5.6 by 15.7 cm (volume = 400 cm^3). This displaces the right kidney anteriorly. The right ureter is obscured adjacent to the hematoma. There is some mild fullness of the right collecting system but no overt hydronephrosis. Stomach/Bowel: Nasogastric tube in place. Distended small bowel the with air-fluid levels noted, measuring up to 3.8 cm, extending down to a abrupt transition just to the right of midline on image 52/3 where the dilated bowel terminates in and nondilated loops. The distal small bowel loops are nondilated and the colon is likewise not dilated. The appearance is compatible with small bowel obstruction and presumably there is some type of twist or adhesion in the vicinity of the transition no pneumatosis. No swirling mesenteric vessels are observed. Vascular/Lymphatic: Retroperitoneal hematoma. Interval abdominal aortic aneurysm repair is not characterized in detail on today's noncontrast exam. There appears to be a stent graft within the original aneurysm with residual mural thrombus and marginal calcification along the original aneurysm site. Posterior to the common iliac branches of the bifurcated stent graft there is some high density material within the lumen of the native abdominal aorta, probably representing incidental mixed density blood products, difficult to exclude a true endoleak without contrast. Mixed density fluid tracks around the right common iliac artery, favoring hematoma and increased from 04/28/2018, with a higher density posterior component. Reproductive: Unremarkable Other: Pelvic ascites, probably complex, pneumoperitoneum not excluded. Increased presacral soft tissue  density/ascites. Increased bilateral flank edema in the subcutaneous tissues. Indirect left inguinal hernia containing adipose tissue and complex ascites. Edema tracks along the upper thigh musculature diffusely. Musculoskeletal: Multilevel degenerative facet arthropathy in the lumbar spine. IMPRESSION: 1. Small-bowel obstruction with a transition point in the proximal ileum shown on image 52/3 where there is a slight angulation of a loop of bowel suggesting an adhesion. This angulation is fairly gentle. Small bowel loops proximal to the transition measure up to 3.8 cm in diameter. 2. There is some increase in volume of the right retroperitoneal and perirenal hematoma, previous volume estimate about 400 cubic cm, currently about twice that period 3. Postoperative findings along the aneurysm. I can not exclude an endoleak based on high density components of the thrombosed native aneurysm, today's exam does not include contrast and accordingly this may simply represent mixed density hematoma rather than endoleak. 4. Small bilateral pleural effusions with passive atelectasis. Additional airspace opacity at the right lung base may represent additional atelectasis or pneumonia. 5. Mild complex pelvic ascites, hemoperitoneum not excluded. 6. Aortic and right coronary artery atherosclerotic calcification. Electronically Signed   By: Gaylyn RongWalter  Liebkemann M.D.   On: 05/07/2018 15:52   Dg Abd 1 View  Result Date: 05/07/2018 CLINICAL DATA:  Followup bowel obstruction. EXAM: ABDOMEN - 1 VIEW COMPARISON:  05/06/2018 FINDINGS: The NG tube is in the body region of the stomach. Interval slight decrease in size of the dilated air-filled small bowel loops. There is some scattered air in the right colon. No free air. IMPRESSION: NG tube in good position. Interval slight decrease in dilated small bowel. Electronically Signed   By: Rudie MeyerP.  Gallerani M.D.   On: 05/07/2018 11:23   Ct Abdomen Pelvis W Contrast  Result Date:  04/28/2018 CLINICAL DATA:  Right lower quadrant pain, nausea. EXAM: CT ABDOMEN AND PELVIS WITH CONTRAST TECHNIQUE: Multidetector CT imaging of the abdomen and pelvis was performed using the standard protocol following bolus administration of intravenous contrast. CONTRAST:  100mL ISOVUE-300 IOPAMIDOL (ISOVUE-300) INJECTION 61% COMPARISON:  None. FINDINGS: Lower chest: Heart is normal size. Descending thoracic aorta is heavily calcified. Dependent atelectasis in the lower lobes. No effusions. Hepatobiliary: Scattered hypodensities in the liver, likely small cysts. Gallbladder unremarkable. Pancreas: No focal abnormality or ductal dilatation. Spleen: No focal abnormality.  Normal size. Adrenals/Urinary Tract: No adrenal abnormality. No focal renal abnormality. No stones or hydronephrosis. Urinary bladder is unremarkable. Stomach/Bowel: Stomach, large and small bowel grossly unremarkable. Vascular/Lymphatic: There is an 8.3 cm infrarenal abdominal aortic aneurysm. Extensive thrombus/plaque noted. In addition, abnormal soft tissue is noted adjacent to the right side of the aneurysm and extending around the right kidney and in the right retroperitoneum compatible with large hematoma, likely related to aortic rupture. No visible active extravasation. Aneurysm in at the aortic bifurcation. Iliac vessels are heavily calcified, normal caliber. No adenopathy. Reproductive: Uterus and adnexa unremarkable.  No mass. Other: Large right retroperitoneal hematoma noted surrounding the right kidney and extending into the upper pelvis. No free fluid or free air. Bilateral inguinal hernias noted containing fat. Musculoskeletal: No acute bony abnormality. IMPRESSION: 8.3 cm infrarenal abdominal aortic aneurysm with large right retroperitoneal hematoma most compatible with aortic rupture. Critical Value/emergent results were called by telephone at the time of interpretation on 04/28/2018 at 6:40 pm to Dr. Benjiman CoreNATHAN PICKERING , who verbally  acknowledged these results. Electronically Signed   By: Charlett NoseKevin  Dover M.D.   On: 04/28/2018 18:40   Dg Chest Port 1 View  Result Date: 05/05/2018 CLINICAL DATA:  Respiratory failure.  Pneumonia. EXAM: PORTABLE CHEST 1 VIEW COMPARISON:  05/04/2018.  05/02/2018.  04/29/2018. FINDINGS: Patchy pulmonary density in the mid and lower lungs bilaterally persists consistent with mild bronchopneumonia and atelectasis. No worsening or new finding. IMPRESSION: Persistent mild patchy bilateral density consistent with atelectasis or pneumonia. No new finding. Electronically Signed   By: Paulina Fusi M.D.   On: 05/05/2018 08:18   Dg Chest Port 1 View  Result Date: 05/04/2018 CLINICAL DATA:  Status post AAA repair EXAM: PORTABLE CHEST 1 VIEW COMPARISON:  05/02/2018 FINDINGS: Cardiac shadow remains enlarged. Aortic calcifications are again seen. The overall inspiratory effort is poor persistent right basilar opacity is noted although significantly improved when compared with the prior exam. Patchy opacities are noted in the left mid lung. Overall interstitial edema is noted as well. No new focal abnormality is seen. IMPRESSION: Diffuse interstitial edema. Patchy infiltrative/atelectatic changes are noted bilaterally although slightly improved when compared with the prior exam. Electronically Signed   By: Alcide Clever M.D.   On: 05/04/2018 08:36   Dg Chest Port 1 View  Result Date: 05/02/2018 CLINICAL DATA:  Shortness of breath EXAM: PORTABLE CHEST 1 VIEW COMPARISON:  04/30/2018 FINDINGS: There is subtly increased bibasilar, right greater than left heterogeneous opacity and pleural effusions. There is mild underlying diffuse interstitial pulmonary opacity. Findings are concerning for worsened edema and effusions. There has been interval removal of an esophagogastric tube and right neck vascular sheath. Unchanged cardiomegaly. The visualized skeletal structures are unremarkable. IMPRESSION: There is subtly increased bibasilar,  right greater than left heterogeneous opacity and pleural effusions. There is mild underlying diffuse interstitial pulmonary opacity. Findings are concerning for worsened edema and effusions. Unchanged cardiomegaly. Electronically Signed   By: Lauralyn Primes M.D.   On: 05/02/2018 16:15   Dg Chest Port 1 View  Result Date: 04/30/2018 CLINICAL DATA:  Abdominal aortic aneurysm. EXAM: PORTABLE CHEST 1 VIEW COMPARISON:  Radiograph of April 29, 2018. FINDINGS: Stable cardiomediastinal silhouette. Atherosclerosis of thoracic aorta is noted. Endotracheal tube and Swan-Ganz catheter have been removed. Distal tip of nasogastric tube is seen in the stomach. No pneumothorax is noted. Mild bibasilar atelectasis or infiltrates are noted with small pleural effusions. Bony thorax is unremarkable. IMPRESSION: Mild bibasilar atelectasis or infiltrates are noted with small pleural effusions, right greater than left. Endotracheal tube has been removed. Nasogastric tube is in grossly good position. Electronically Signed   By: Lupita Raider, M.D.   On: 04/30/2018 07:46   Dg Chest Port 1 View  Result Date: 04/29/2018 CLINICAL DATA:  Followup intubated patient. EXAM: PORTABLE CHEST 1 VIEW COMPARISON:  04/29/2018 at 0052 hours FINDINGS: Right internal jugular Swan-Ganz catheter, endotracheal tube and nasal/orogastric tube are stable and well positioned. There are prominent bronchovascular markings. Additional opacity is noted in the lung bases likely a combination of small effusions and atelectasis. This is accentuated by low lung volumes. Findings are without change from the earlier exam. No pneumothorax. IMPRESSION: 1. No significant change from the study obtained earlier today. 2. Support apparatus is well positioned. 3. Prominent bronchovascular markings and persistent lung base opacity consistent with small effusions and atelectasis. No convincing pneumonia or pulmonary edema. Electronically Signed   By: Amie Portland M.D.    On: 04/29/2018 11:56   Dg Chest Port 1 View  Result Date: 04/29/2018 CLINICAL DATA:  Postop abdominal aortic aneurysm stent. EXAM: PORTABLE CHEST 1 VIEW COMPARISON:  12/19/2014 FINDINGS: Endotracheal tube with tip measuring 4.2 cm above the carina. Enteric tube tip  is off the field of view but below the left hemidiaphragm. Right Swan-Ganz catheter with tip over the pulmonary outflow tract. No pneumothorax. Shallow inspiration with linear atelectasis in the lung bases, greater on the left. Probable small left pleural effusion. Heart size and pulmonary vascularity are normal for technique. No focal consolidation in the lungs. Mediastinal contours appear intact. Calcification of the aorta. IMPRESSION: Appliances appear in satisfactory location. Shallow inspiration with atelectasis in the lung bases, greater on the left. Probable small left pleural effusion. Electronically Signed   By: Burman NievesWilliam  Stevens M.D.   On: 04/29/2018 01:16   Dg Abd Portable 1v  Result Date: 05/06/2018 CLINICAL DATA:  Vomiting EXAM: PORTABLE ABDOMEN - 1 VIEW COMPARISON:  May 26, 2018 study obtained earlier in the day FINDINGS: Nasogastric tube tip and side port are in the stomach. There remain multiple loops of dilated bowel, similar to earlier in the day. Is no appreciable free air. There are skin staples present. Lung bases are clear. IMPRESSION: Persistent bowel dilatation. Skin staples present. Suspect postoperative ileus as most likely. A degree of developing bowel obstruction cannot be excluded by radiography, however. No free air demonstrable. Lung bases clear. Nasogastric tube tip and side port in stomach. Electronically Signed   By: Bretta BangWilliam  Woodruff III M.D.   On: 05/06/2018 11:40   Dg Abd Portable 1v  Result Date: 05/06/2018 CLINICAL DATA:  Abdominal distension. History of surgery past Friday. EXAM: PORTABLE ABDOMEN - 1 VIEW COMPARISON:  CT scan 04/28/2018 FINDINGS: Moderate air distended small bowel along with mild  distention of the right and transverse colon. Findings are most likely due to a postoperative ileus but could not exclude an Ashyah Quizon or partial small bowel obstruction. Recommend correlation with bowel sounds and follow-up radiographs as indicated. No free air. IMPRESSION: Dilated bowel, mainly small-bowel. Postoperative ileus versus small-bowel obstruction. Recommend correlation with bowel sounds. Electronically Signed   By: Rudie MeyerP.  Gallerani M.D.   On: 05/06/2018 09:11   [place] Ekg Site Rite  Result Date: 05/03/2018 If Site Rite image not attached, placement could not be confirmed due to current cardiac rhythm.  Anti-infectives: Anti-infectives (From admission, onward)   Start     Dose/Rate Route Frequency Ordered Stop   05/03/18 1815  cefTRIAXone (ROCEPHIN) 1 g in sodium chloride 0.9 % 100 mL IVPB     1 g 200 mL/hr over 30 Minutes Intravenous Every 24 hours 05/03/18 1728     04/29/18 0400  ceFAZolin (ANCEF) IVPB 2g/100 mL premix     2 g 200 mL/hr over 30 Minutes Intravenous Every 8 hours 04/29/18 0044 04/29/18 1259      Assessment/Plan: s/p Procedure(s): ANEURYSM ABDOMINAL AORTIC REPAIR WITH HEMASHIELD GOLD VASCULAR GRAFT 22X11 mm (N/A) Hemodynamically stable.  White blood cell count down slightly to 14,000.  Creatinine stable at 2.  Falsely elevated potassium level of greater than 6.  Potassium this morning of 3.6.  240 cc urine output in 12 hours.  Gust with the patient and her daughter.  Main concern now is GI function.  KUB and CT scan appears to represent an ileus pattern.  Discussed that if she does have a bowel obstruction that this could require surgery.  Continue to keep n.p.o. except for ice chips.  Mobilize as much as possible.  Continue NG suction   LOS: 10 days   Ayana Imhof 05/08/2018, 7:39 AM

## 2018-05-08 NOTE — Progress Notes (Signed)
Progress Note  Patient Name: Leah Mata Date of Encounter: 05/08/2018  Primary Cardiologist: No primary care provider on file.   Subjective   Continues in NSR significant improvement in ectopy.  She has had no further atrial fibrillation or flutter.  Unfortunately she continues to have an ileus.  Her Lopressor was held several times yesterday due to low blood pressures.  Inpatient Medications    Scheduled Meds: . chlorhexidine  15 mL Mouth Rinse BID  . docusate sodium  100 mg Oral Daily  . fluticasone  2 spray Each Nare Daily  . heparin  5,000 Units Subcutaneous Q8H  . ipratropium-albuterol  3 mL Nebulization Q6H  . lactulose  30 g Oral BID  . mouth rinse  15 mL Mouth Rinse q12n4p  . metoprolol tartrate  5 mg Intravenous Q6H  . pantoprazole (PROTONIX) IV  40 mg Intravenous Q12H  . polyethylene glycol  17 g Oral Daily  . senna-docusate  2 tablet Oral QHS  . sodium chloride flush  10-40 mL Intracatheter Q12H   Continuous Infusions: . sodium chloride Stopped (05/05/18 2047)  . amiodarone 30 mg/hr (05/07/18 2140)  . cefTRIAXone (ROCEPHIN)  IV 1 g (05/07/18 1656)  . dextrose 5 % and 0.9% NaCl 125 mL/hr at 05/08/18 0553  . magnesium sulfate 1 - 4 g bolus IVPB     PRN Meds: sodium chloride, alum & mag hydroxide-simeth, bisacodyl, guaiFENesin-dextromethorphan, hydrALAZINE, levalbuterol, magnesium sulfate 1 - 4 g bolus IVPB, morphine injection, ondansetron, oxyCODONE-acetaminophen, phenol, potassium chloride, sodium chloride flush   Vital Signs    Vitals:   05/08/18 0600 05/08/18 0700 05/08/18 0800 05/08/18 0802  BP: (!) 122/56 (!) 122/58 123/63   Pulse:      Resp: 16 15 (!) 22   Temp:    (!) 97.5 F (36.4 C)  TempSrc:    Oral  SpO2: 94% 94% 94% 94%  Weight:      Height:        Intake/Output Summary (Last 24 hours) at 05/08/2018 0856 Last data filed at 05/08/2018 0800 Gross per 24 hour  Intake 3831.97 ml  Output 1530 ml  Net 2301.97 ml   Filed Weights   04/28/18  1603 05/01/18 0600  Weight: 69.9 kg 83.9 kg    Telemetry  Sinus rhythm with occasional PACs- Personally Reviewed  ECG    No new EKG to review- Personally Reviewed  Physical Exam   GEN: Well nourished, well developed in no acute distress HEENT: Normal NECK: No JVD; No carotid bruits LYMPHATICS: No lymphadenopathy CARDIAC:RRR, no murmurs, rubs, gallops RESPIRATORY:  Clear to auscultation without rales, wheezing or rhonchi  ABDOMEN: Nontender MUSCULOSKELETAL:  No edema; No deformity  SKIN: Warm and dry NEUROLOGIC:  Alert and oriented x 3 PSYCHIATRIC:  Normal affect    Labs    Chemistry Recent Labs  Lab 05/04/18 0358 05/06/18 0220 05/07/18 0234 05/07/18 0636 05/08/18 0321  NA 138 133* 133* 134* 135  K 4.3 3.8 6.2* 5.0 3.6  CL 102 93* 94* 95* 100  CO2 25 25 22 24 25   GLUCOSE 119* 127* 102* 115* 151*  BUN 36* 36* 57* 61* 64*  CREATININE 1.22* 1.00 1.89* 2.00* 2.05*  CALCIUM 9.5 9.4 9.4 9.5 8.9  PROT 5.5* 6.0*  --   --   --   ALBUMIN 2.4* 2.5*  --   --   --   AST 28 28  --   --   --   ALT 26 25  --   --   --  ALKPHOS 58 79  --   --   --   BILITOT 2.6* 3.5*  --   --   --   GFRNONAA 45* 57* 26* 24* 24*  GFRAA 52* >60 30* 28* 28*  ANIONGAP 11 15 17* 15 10     Hematology Recent Labs  Lab 05/04/18 0358 05/07/18 0234 05/08/18 0321  WBC 12.8* 15.3* 14.5*  RBC 4.66 5.03 4.34  HGB 13.4 14.4 12.9  HCT 41.8 43.5 37.2  MCV 89.7 86.5 85.7  MCH 28.8 28.6 29.7  MCHC 32.1 33.1 34.7  RDW 15.1 16.3* 16.1*  PLT 179 263 222    Cardiac EnzymesNo results for input(s): TROPONINI in the last 168 hours. No results for input(s): TROPIPOC in the last 168 hours.   BNP Recent Labs  Lab 05/03/18 1004 05/07/18 0234  BNP 275.0* 86.8     DDimer No results for input(s): DDIMER in the last 168 hours.   Radiology    Ct Abdomen Pelvis Wo Contrast  Result Date: 05/07/2018 CLINICAL DATA:  Abdominal distention. Bowel obstruction. Abdominal aortic aneurysm repair on 04/28/2018  EXAM: CT ABDOMEN AND PELVIS WITHOUT CONTRAST TECHNIQUE: Multidetector CT imaging of the abdomen and pelvis was performed following the standard protocol without IV contrast. COMPARISON:  04/28/2018 CT scan FINDINGS: Lower chest: Small bilateral pleural effusions with passive atelectasis. There some additional airspace opacity in the right lower lobe which may represent additional atelectasis or pneumonia. Right coronary artery atherosclerotic calcification. Hepatobiliary: Stable small hypodense hepatic lesions, likely cysts or similar benign structures. Contrast medium gallbladder probably from vicarious excretion. No biliary dilatation. Pancreas: Stable atrophic appearance of the pancreas. Spleen: Unremarkable Adrenals/Urinary Tract: Both adrenal glands appear within normal limits. Modest increase in size of the right perirenal and retroperitoneal hematoma, current estimate at about 8.9 by 10.0 by 17.9 cm (volume = 830 cm^3), formerly about 8.7 by 5.6 by 15.7 cm (volume = 400 cm^3). This displaces the right kidney anteriorly. The right ureter is obscured adjacent to the hematoma. There is some mild fullness of the right collecting system but no overt hydronephrosis. Stomach/Bowel: Nasogastric tube in place. Distended small bowel the with air-fluid levels noted, measuring up to 3.8 cm, extending down to a abrupt transition just to the right of midline on image 52/3 where the dilated bowel terminates in and nondilated loops. The distal small bowel loops are nondilated and the colon is likewise not dilated. The appearance is compatible with small bowel obstruction and presumably there is some type of twist or adhesion in the vicinity of the transition no pneumatosis. No swirling mesenteric vessels are observed. Vascular/Lymphatic: Retroperitoneal hematoma. Interval abdominal aortic aneurysm repair is not characterized in detail on today's noncontrast exam. There appears to be a stent graft within the original aneurysm  with residual mural thrombus and marginal calcification along the original aneurysm site. Posterior to the common iliac branches of the bifurcated stent graft there is some high density material within the lumen of the native abdominal aorta, probably representing incidental mixed density blood products, difficult to exclude a true endoleak without contrast. Mixed density fluid tracks around the right common iliac artery, favoring hematoma and increased from 04/28/2018, with a higher density posterior component. Reproductive: Unremarkable Other: Pelvic ascites, probably complex, pneumoperitoneum not excluded. Increased presacral soft tissue density/ascites. Increased bilateral flank edema in the subcutaneous tissues. Indirect left inguinal hernia containing adipose tissue and complex ascites. Edema tracks along the upper thigh musculature diffusely. Musculoskeletal: Multilevel degenerative facet arthropathy in the lumbar spine. IMPRESSION: 1.  Small-bowel obstruction with a transition point in the proximal ileum shown on image 52/3 where there is a slight angulation of a loop of bowel suggesting an adhesion. This angulation is fairly gentle. Small bowel loops proximal to the transition measure up to 3.8 cm in diameter. 2. There is some increase in volume of the right retroperitoneal and perirenal hematoma, previous volume estimate about 400 cubic cm, currently about twice that period 3. Postoperative findings along the aneurysm. I can not exclude an endoleak based on high density components of the thrombosed native aneurysm, today's exam does not include contrast and accordingly this may simply represent mixed density hematoma rather than endoleak. 4. Small bilateral pleural effusions with passive atelectasis. Additional airspace opacity at the right lung base may represent additional atelectasis or pneumonia. 5. Mild complex pelvic ascites, hemoperitoneum not excluded. 6. Aortic and right coronary artery  atherosclerotic calcification. Electronically Signed   By: Gaylyn Rong M.D.   On: 05/07/2018 15:52   Dg Abd 1 View  Result Date: 05/07/2018 CLINICAL DATA:  Followup bowel obstruction. EXAM: ABDOMEN - 1 VIEW COMPARISON:  05/06/2018 FINDINGS: The NG tube is in the body region of the stomach. Interval slight decrease in size of the dilated air-filled small bowel loops. There is some scattered air in the right colon. No free air. IMPRESSION: NG tube in good position. Interval slight decrease in dilated small bowel. Electronically Signed   By: Rudie Meyer M.D.   On: 05/07/2018 11:23   Dg Chest Port 1 View  Result Date: 05/08/2018 CLINICAL DATA:  Shortness of breath, acute respiratory failure. EXAM: PORTABLE CHEST 1 VIEW COMPARISON:  Radiograph of May 05, 2018. FINDINGS: Stable cardiomediastinal silhouette. Atherosclerosis of thoracic aorta is noted. Distal tip of nasogastric tube is seen in proximal stomach. No pneumothorax is noted. Stable bilateral perihilar and basilar opacities are noted with small right pleural effusion. Bony thorax is unremarkable. IMPRESSION: Stable bilateral perihilar and basilar opacities are noted with small right pleural effusion. Aortic Atherosclerosis (ICD10-I70.0). Electronically Signed   By: Lupita Raider, M.D.   On: 05/08/2018 07:55   Dg Abd Portable 1v  Result Date: 05/06/2018 CLINICAL DATA:  Vomiting EXAM: PORTABLE ABDOMEN - 1 VIEW COMPARISON:  May 26, 2018 study obtained earlier in the day FINDINGS: Nasogastric tube tip and side port are in the stomach. There remain multiple loops of dilated bowel, similar to earlier in the day. Is no appreciable free air. There are skin staples present. Lung bases are clear. IMPRESSION: Persistent bowel dilatation. Skin staples present. Suspect postoperative ileus as most likely. A degree of developing bowel obstruction cannot be excluded by radiography, however. No free air demonstrable. Lung bases clear. Nasogastric  tube tip and side port in stomach. Electronically Signed   By: Bretta Bang III M.D.   On: 05/06/2018 11:40    Cardiac Studies   2D echo 05/03/2018 IMPRESSIONS   1. The left ventricle has hyperdynamic systolic function of >65%. The cavity size is normal. There is no increased left ventricular wall thickness. Echo evidence of impaired diastolic relaxation.  2. The right ventricle has normal systolic function. The cavity in normal in size. There is no increase in right ventricular wall thickness.  3. The mitral valve is normal in structure.  4. The aortic valve has an indeterminant number of cusps.  5. The pulmonic valve is Not well seen. Pulmonic valve regurgitation was not assessed by color flow Doppler.  6. There is mild dilatation of the aortic root.  7.  The interatrial septum was not assessed.  8. Extremely limited due to poor sound wave transmission; definity used; vigorous LV systolic function; mild diastolic dysfunction.  Patient Profile     73 y.o. female with a hx of HTN, HLD and COPD who is being seen today for the evaluation of atrial fibrillation after repair of ruptured AAA   Assessment & Plan     1.  Paroxysmal atrial fibrillation -This occurred in the setting of aspiration pneumonia and recovery from AAA repair -In review of telemetry over the past 24 hours continues to maintain normal sinus rhythm with no breakthrough atrial fibrillation or flutter.  Her PACs have improved as well. -2 of her doses of Lopressor were held yesterday due to low blood pressures. -Decrease Lopressor to 2.5 mg every 6 hours and hold for systolic blood pressure less than 100 -Continue IV amio and change to p.o. once her ileus has resolved  2.  8.3 cm infrarenal AAA -Status post repair per vascular -Continue statin once taking p.o.  3.  Acute respiratory failure -Secondary  to aspiration pneumonia -Per CCM  4.  Hypertension  -BP remains well controlled at 123/63 mmHg -Continue  Lopressor IV but decrease to 2.5 mg every 6 hours and hold for SBP <100 since several doses were held yesterday due to soft blood pressure -Amlodipine on hold due to ileus  4.  Hyperlipidemia -Continue statin therapy once taking p.o. -Check FLP in a.m.   For questions or updates, please contact [facility] Please consult [email or web address] for contact info under Cardiology/STEMI.      Signed, Armanda Magic, MD  05/08/2018, 8:56 AM

## 2018-05-09 ENCOUNTER — Inpatient Hospital Stay (HOSPITAL_COMMUNITY): Payer: Medicare PPO

## 2018-05-09 DIAGNOSIS — R918 Other nonspecific abnormal finding of lung field: Secondary | ICD-10-CM

## 2018-05-09 DIAGNOSIS — I480 Paroxysmal atrial fibrillation: Secondary | ICD-10-CM

## 2018-05-09 DIAGNOSIS — R0902 Hypoxemia: Secondary | ICD-10-CM

## 2018-05-09 DIAGNOSIS — I4891 Unspecified atrial fibrillation: Secondary | ICD-10-CM

## 2018-05-09 LAB — CBC WITH DIFFERENTIAL/PLATELET
Abs Immature Granulocytes: 0.36 10*3/uL — ABNORMAL HIGH (ref 0.00–0.07)
Basophils Absolute: 0.1 10*3/uL (ref 0.0–0.1)
Basophils Relative: 0 %
Eosinophils Absolute: 0.1 10*3/uL (ref 0.0–0.5)
Eosinophils Relative: 0 %
HCT: 39.5 % (ref 36.0–46.0)
HEMOGLOBIN: 13.2 g/dL (ref 12.0–15.0)
Immature Granulocytes: 2 %
Lymphocytes Relative: 6 %
Lymphs Abs: 0.9 10*3/uL (ref 0.7–4.0)
MCH: 29 pg (ref 26.0–34.0)
MCHC: 33.4 g/dL (ref 30.0–36.0)
MCV: 86.8 fL (ref 80.0–100.0)
Monocytes Absolute: 2.1 10*3/uL — ABNORMAL HIGH (ref 0.1–1.0)
Monocytes Relative: 14 %
Neutro Abs: 11.8 10*3/uL — ABNORMAL HIGH (ref 1.7–7.7)
Neutrophils Relative %: 78 %
Platelets: 222 10*3/uL (ref 150–400)
RBC: 4.55 MIL/uL (ref 3.87–5.11)
RDW: 16.8 % — ABNORMAL HIGH (ref 11.5–15.5)
WBC: 15.3 10*3/uL — ABNORMAL HIGH (ref 4.0–10.5)
nRBC: 0 % (ref 0.0–0.2)

## 2018-05-09 LAB — BASIC METABOLIC PANEL
Anion gap: 10 (ref 5–15)
BUN: 52 mg/dL — ABNORMAL HIGH (ref 8–23)
CO2: 26 mmol/L (ref 22–32)
Calcium: 9.1 mg/dL (ref 8.9–10.3)
Chloride: 104 mmol/L (ref 98–111)
Creatinine, Ser: 1.53 mg/dL — ABNORMAL HIGH (ref 0.44–1.00)
GFR calc Af Amer: 39 mL/min — ABNORMAL LOW (ref >60)
GFR calc non Af Amer: 34 mL/min — ABNORMAL LOW (ref >60)
Glucose, Bld: 136 mg/dL — ABNORMAL HIGH (ref 70–99)
Potassium: 3.2 mmol/L — ABNORMAL LOW (ref 3.5–5.1)
Sodium: 140 mmol/L (ref 135–145)

## 2018-05-09 LAB — LIPID PANEL
Cholesterol: 136 mg/dL (ref 0–200)
HDL: 19 mg/dL — ABNORMAL LOW (ref >40)
LDL Cholesterol: 68 mg/dL (ref 0–99)
Total CHOL/HDL Ratio: 7.2 RATIO
Triglycerides: 244 mg/dL — ABNORMAL HIGH (ref <150)
VLDL: 49 mg/dL — ABNORMAL HIGH (ref 0–40)

## 2018-05-09 LAB — CALCIUM / CREATININE RATIO, URINE
Calcium, Ur: 2.1 mg/dL
Calcium/Creat.Ratio: 19 mg/g creat — ABNORMAL LOW (ref 29–442)
Creatinine, Urine: 111.4 mg/dL

## 2018-05-09 LAB — PHOSPHORUS: Phosphorus: 2.6 mg/dL (ref 2.5–4.6)

## 2018-05-09 LAB — MAGNESIUM: Magnesium: 2.3 mg/dL (ref 1.7–2.4)

## 2018-05-09 MED ORDER — FLEET ENEMA 7-19 GM/118ML RE ENEM
1.0000 | ENEMA | Freq: Once | RECTAL | Status: DC
  Filled 2018-05-09: qty 1

## 2018-05-09 MED ORDER — POTASSIUM CHLORIDE 10 MEQ/100ML IV SOLN
10.0000 meq | INTRAVENOUS | Status: AC
  Administered 2018-05-09 (×4): 10 meq via INTRAVENOUS
  Filled 2018-05-09 (×4): qty 100

## 2018-05-09 NOTE — Progress Notes (Signed)
Subjective: Interval History: none.. Was up walking 3 times yesterday.  Reports no nausea this morning.  Does feel like she is hungry.  Denies abdominal pain.  NG came out yesterday evening and was unsuccessful in replacement by the nursing staff. Objective: Vital signs in last 24 hours: Temp:  [97.6 F (36.4 C)-98.1 F (36.7 C)] 97.6 F (36.4 C) (02/11 0758) Pulse Rate:  [75] 75 (02/11 0135) Resp:  [16-32] 20 (02/11 0700) BP: (116-157)/(55-74) 148/63 (02/11 0700) SpO2:  [89 %-100 %] 96 % (02/11 0813)  Intake/Output from previous day: 02/10 0701 - 02/11 0700 In: 3400 [I.V.:3400] Out: 1700 [Urine:1300; Emesis/NG output:400] Intake/Output this shift: No intake/output data recorded.  Abdomen distended but soft and nontender.  Lab Results: Recent Labs    05/08/18 0321 05/09/18 0242  WBC 14.5* 15.3*  HGB 12.9 13.2  HCT 37.2 39.5  PLT 222 222   BMET Recent Labs    05/08/18 0321 05/09/18 0242  NA 135 140  K 3.6 3.2*  CL 100 104  CO2 25 26  GLUCOSE 151* 136*  BUN 64* 52*  CREATININE 2.05* 1.53*  CALCIUM 8.9 9.1    Studies/Results: Ct Abdomen Pelvis Wo Contrast  Result Date: 05/07/2018 CLINICAL DATA:  Abdominal distention. Bowel obstruction. Abdominal aortic aneurysm repair on 04/28/2018 EXAM: CT ABDOMEN AND PELVIS WITHOUT CONTRAST TECHNIQUE: Multidetector CT imaging of the abdomen and pelvis was performed following the standard protocol without IV contrast. COMPARISON:  04/28/2018 CT scan FINDINGS: Lower chest: Small bilateral pleural effusions with passive atelectasis. There some additional airspace opacity in the right lower lobe which may represent additional atelectasis or pneumonia. Right coronary artery atherosclerotic calcification. Hepatobiliary: Stable small hypodense hepatic lesions, likely cysts or similar benign structures. Contrast medium gallbladder probably from vicarious excretion. No biliary dilatation. Pancreas: Stable atrophic appearance of the pancreas.  Spleen: Unremarkable Adrenals/Urinary Tract: Both adrenal glands appear within normal limits. Modest increase in size of the right perirenal and retroperitoneal hematoma, current estimate at about 8.9 by 10.0 by 17.9 cm (volume = 830 cm^3), formerly about 8.7 by 5.6 by 15.7 cm (volume = 400 cm^3). This displaces the right kidney anteriorly. The right ureter is obscured adjacent to the hematoma. There is some mild fullness of the right collecting system but no overt hydronephrosis. Stomach/Bowel: Nasogastric tube in place. Distended small bowel the with air-fluid levels noted, measuring up to 3.8 cm, extending down to a abrupt transition just to the right of midline on image 52/3 where the dilated bowel terminates in and nondilated loops. The distal small bowel loops are nondilated and the colon is likewise not dilated. The appearance is compatible with small bowel obstruction and presumably there is some type of twist or adhesion in the vicinity of the transition no pneumatosis. No swirling mesenteric vessels are observed. Vascular/Lymphatic: Retroperitoneal hematoma. Interval abdominal aortic aneurysm repair is not characterized in detail on today's noncontrast exam. There appears to be a stent graft within the original aneurysm with residual mural thrombus and marginal calcification along the original aneurysm site. Posterior to the common iliac branches of the bifurcated stent graft there is some high density material within the lumen of the native abdominal aorta, probably representing incidental mixed density blood products, difficult to exclude a true endoleak without contrast. Mixed density fluid tracks around the right common iliac artery, favoring hematoma and increased from 04/28/2018, with a higher density posterior component. Reproductive: Unremarkable Other: Pelvic ascites, probably complex, pneumoperitoneum not excluded. Increased presacral soft tissue density/ascites. Increased bilateral flank edema  in the subcutaneous tissues. Indirect left inguinal hernia containing adipose tissue and complex ascites. Edema tracks along the upper thigh musculature diffusely. Musculoskeletal: Multilevel degenerative facet arthropathy in the lumbar spine. IMPRESSION: 1. Small-bowel obstruction with a transition point in the proximal ileum shown on image 52/3 where there is a slight angulation of a loop of bowel suggesting an adhesion. This angulation is fairly gentle. Small bowel loops proximal to the transition measure up to 3.8 cm in diameter. 2. There is some increase in volume of the right retroperitoneal and perirenal hematoma, previous volume estimate about 400 cubic cm, currently about twice that period 3. Postoperative findings along the aneurysm. I can not exclude an endoleak based on high density components of the thrombosed native aneurysm, today's exam does not include contrast and accordingly this may simply represent mixed density hematoma rather than endoleak. 4. Small bilateral pleural effusions with passive atelectasis. Additional airspace opacity at the right lung base may represent additional atelectasis or pneumonia. 5. Mild complex pelvic ascites, hemoperitoneum not excluded. 6. Aortic and right coronary artery atherosclerotic calcification. Electronically Signed   By: Gaylyn Rong M.D.   On: 05/07/2018 15:52   Dg Abd 1 View  Result Date: 05/07/2018 CLINICAL DATA:  Followup bowel obstruction. EXAM: ABDOMEN - 1 VIEW COMPARISON:  05/06/2018 FINDINGS: The NG tube is in the body region of the stomach. Interval slight decrease in size of the dilated air-filled small bowel loops. There is some scattered air in the right colon. No free air. IMPRESSION: NG tube in good position. Interval slight decrease in dilated small bowel. Electronically Signed   By: Rudie Meyer M.D.   On: 05/07/2018 11:23   Ct Abdomen Pelvis W Contrast  Result Date: 04/28/2018 CLINICAL DATA:  Right lower quadrant pain, nausea.  EXAM: CT ABDOMEN AND PELVIS WITH CONTRAST TECHNIQUE: Multidetector CT imaging of the abdomen and pelvis was performed using the standard protocol following bolus administration of intravenous contrast. CONTRAST:  ISOVUE-300 IOPAMIDOL (ISOVUE-300) INJECTION 61% COMPARISON:  None. FINDINGS: Lower chest: Heart is normal size. Descending thoracic aorta is heavily calcified. Dependent atelectasis in the lower lobes. No effusions. Hepatobiliary: Scattered hypodensities in the liver, likely small cysts. Gallbladder unremarkable. Pancreas: No focal abnormality or ductal dilatation. Spleen: No focal abnormality.  Normal size. Adrenals/Urinary Tract: No adrenal abnormality. No focal renal abnormality. No stones or hydronephrosis. Urinary bladder is unremarkable. Stomach/Bowel: Stomach, large and small bowel grossly unremarkable. Vascular/Lymphatic: There is an 8.3 cm infrarenal abdominal aortic aneurysm. Extensive thrombus/plaque noted. In addition, abnormal soft tissue is noted adjacent to the right side of the aneurysm and extending around the right kidney and in the right retroperitoneum compatible with large hematoma, likely related to aortic rupture. No visible active extravasation. Aneurysm in at the aortic bifurcation. Iliac vessels are heavily calcified, normal caliber. No adenopathy. Reproductive: Uterus and adnexa unremarkable.  No mass. Other: Large right retroperitoneal hematoma noted surrounding the right kidney and extending into the upper pelvis. No free fluid or free air. Bilateral inguinal hernias noted containing fat. Musculoskeletal: No acute bony abnormality. IMPRESSION: 8.3 cm infrarenal abdominal aortic aneurysm with large right retroperitoneal hematoma most compatible with aortic rupture. Critical Value/emergent results were called by telephone at the time of interpretation on 04/28/2018 at 6:40 pm to Dr. Benjiman Core , who verbally acknowledged these results. Electronically Signed   By: Charlett Nose M.D.   On: 04/28/2018 18:40   Dg Chest Port 1 View  Result Date: 05/08/2018 CLINICAL DATA:  Shortness of breath, acute  respiratory failure. EXAM: PORTABLE CHEST 1 VIEW COMPARISON:  Radiograph of May 05, 2018. FINDINGS: Stable cardiomediastinal silhouette. Atherosclerosis of thoracic aorta is noted. Distal tip of nasogastric tube is seen in proximal stomach. No pneumothorax is noted. Stable bilateral perihilar and basilar opacities are noted with small right pleural effusion. Bony thorax is unremarkable. IMPRESSION: Stable bilateral perihilar and basilar opacities are noted with small right pleural effusion. Aortic Atherosclerosis (ICD10-I70.0). Electronically Signed   By: Lupita RaiderJames  Green Jr, M.D.   On: 05/08/2018 07:55   Dg Chest Port 1 View  Result Date: 05/05/2018 CLINICAL DATA:  Respiratory failure.  Pneumonia. EXAM: PORTABLE CHEST 1 VIEW COMPARISON:  05/04/2018.  05/02/2018.  04/29/2018. FINDINGS: Patchy pulmonary density in the mid and lower lungs bilaterally persists consistent with mild bronchopneumonia and atelectasis. No worsening or new finding. IMPRESSION: Persistent mild patchy bilateral density consistent with atelectasis or pneumonia. No new finding. Electronically Signed   By: Paulina FusiMark  Shogry M.D.   On: 05/05/2018 08:18   Dg Chest Port 1 View  Result Date: 05/04/2018 CLINICAL DATA:  Status post AAA repair EXAM: PORTABLE CHEST 1 VIEW COMPARISON:  05/02/2018 FINDINGS: Cardiac shadow remains enlarged. Aortic calcifications are again seen. The overall inspiratory effort is poor persistent right basilar opacity is noted although significantly improved when compared with the prior exam. Patchy opacities are noted in the left mid lung. Overall interstitial edema is noted as well. No new focal abnormality is seen. IMPRESSION: Diffuse interstitial edema. Patchy infiltrative/atelectatic changes are noted bilaterally although slightly improved when compared with the prior exam. Electronically  Signed   By: Alcide CleverMark  Lukens M.D.   On: 05/04/2018 08:36   Dg Chest Port 1 View  Result Date: 05/02/2018 CLINICAL DATA:  Shortness of breath EXAM: PORTABLE CHEST 1 VIEW COMPARISON:  04/30/2018 FINDINGS: There is subtly increased bibasilar, right greater than left heterogeneous opacity and pleural effusions. There is mild underlying diffuse interstitial pulmonary opacity. Findings are concerning for worsened edema and effusions. There has been interval removal of an esophagogastric tube and right neck vascular sheath. Unchanged cardiomegaly. The visualized skeletal structures are unremarkable. IMPRESSION: There is subtly increased bibasilar, right greater than left heterogeneous opacity and pleural effusions. There is mild underlying diffuse interstitial pulmonary opacity. Findings are concerning for worsened edema and effusions. Unchanged cardiomegaly. Electronically Signed   By: Lauralyn PrimesAlex  Bibbey M.D.   On: 05/02/2018 16:15   Dg Chest Port 1 View  Result Date: 04/30/2018 CLINICAL DATA:  Abdominal aortic aneurysm. EXAM: PORTABLE CHEST 1 VIEW COMPARISON:  Radiograph of April 29, 2018. FINDINGS: Stable cardiomediastinal silhouette. Atherosclerosis of thoracic aorta is noted. Endotracheal tube and Swan-Ganz catheter have been removed. Distal tip of nasogastric tube is seen in the stomach. No pneumothorax is noted. Mild bibasilar atelectasis or infiltrates are noted with small pleural effusions. Bony thorax is unremarkable. IMPRESSION: Mild bibasilar atelectasis or infiltrates are noted with small pleural effusions, right greater than left. Endotracheal tube has been removed. Nasogastric tube is in grossly good position. Electronically Signed   By: Lupita RaiderJames  Green Jr, M.D.   On: 04/30/2018 07:46   Dg Chest Port 1 View  Result Date: 04/29/2018 CLINICAL DATA:  Followup intubated patient. EXAM: PORTABLE CHEST 1 VIEW COMPARISON:  04/29/2018 at 0052 hours FINDINGS: Right internal jugular Swan-Ganz catheter, endotracheal tube  and nasal/orogastric tube are stable and well positioned. There are prominent bronchovascular markings. Additional opacity is noted in the lung bases likely a combination of small effusions and atelectasis. This is accentuated by low lung volumes. Findings are  without change from the earlier exam. No pneumothorax. IMPRESSION: 1. No significant change from the study obtained earlier today. 2. Support apparatus is well positioned. 3. Prominent bronchovascular markings and persistent lung base opacity consistent with small effusions and atelectasis. No convincing pneumonia or pulmonary edema. Electronically Signed   By: Amie Portland M.D.   On: 04/29/2018 11:56   Dg Chest Port 1 View  Result Date: 04/29/2018 CLINICAL DATA:  Postop abdominal aortic aneurysm stent. EXAM: PORTABLE CHEST 1 VIEW COMPARISON:  12/19/2014 FINDINGS: Endotracheal tube with tip measuring 4.2 cm above the carina. Enteric tube tip is off the field of view but below the left hemidiaphragm. Right Swan-Ganz catheter with tip over the pulmonary outflow tract. No pneumothorax. Shallow inspiration with linear atelectasis in the lung bases, greater on the left. Probable small left pleural effusion. Heart size and pulmonary vascularity are normal for technique. No focal consolidation in the lungs. Mediastinal contours appear intact. Calcification of the aorta. IMPRESSION: Appliances appear in satisfactory location. Shallow inspiration with atelectasis in the lung bases, greater on the left. Probable small left pleural effusion. Electronically Signed   By: Burman Nieves M.D.   On: 04/29/2018 01:16   Dg Abd Portable 1v  Result Date: 05/06/2018 CLINICAL DATA:  Vomiting EXAM: PORTABLE ABDOMEN - 1 VIEW COMPARISON:  May 26, 2018 study obtained earlier in the day FINDINGS: Nasogastric tube tip and side port are in the stomach. There remain multiple loops of dilated bowel, similar to earlier in the day. Is no appreciable free air. There are skin  staples present. Lung bases are clear. IMPRESSION: Persistent bowel dilatation. Skin staples present. Suspect postoperative ileus as most likely. A degree of developing bowel obstruction cannot be excluded by radiography, however. No free air demonstrable. Lung bases clear. Nasogastric tube tip and side port in stomach. Electronically Signed   By: Bretta Bang III M.D.   On: 05/06/2018 11:40   Dg Abd Portable 1v  Result Date: 05/06/2018 CLINICAL DATA:  Abdominal distension. History of surgery past Friday. EXAM: PORTABLE ABDOMEN - 1 VIEW COMPARISON:  CT scan 04/28/2018 FINDINGS: Moderate air distended small bowel along with mild distention of the right and transverse colon. Findings are most likely due to a postoperative ileus but could not exclude an  or partial small bowel obstruction. Recommend correlation with bowel sounds and follow-up radiographs as indicated. No free air. IMPRESSION: Dilated bowel, mainly small-bowel. Postoperative ileus versus small-bowel obstruction. Recommend correlation with bowel sounds. Electronically Signed   By: Rudie Meyer M.D.   On: 05/06/2018 09:11   [place] Ekg Site Rite  Result Date: 05/03/2018 If Site Rite image not attached, placement could not be confirmed due to current cardiac rhythm.  Anti-infectives: Anti-infectives (From admission, onward)   Start     Dose/Rate Route Frequency Ordered Stop   05/03/18 1815  cefTRIAXone (ROCEPHIN) 1 g in sodium chloride 0.9 % 100 mL IVPB     1 g 200 mL/hr over 30 Minutes Intravenous Every 24 hours 05/03/18 1728     04/29/18 0400  ceFAZolin (ANCEF) IVPB 2g/100 mL premix     2 g 200 mL/hr over 30 Minutes Intravenous Every 8 hours 04/29/18 0044 04/29/18 1259      Assessment/Plan: s/p Procedure(s): ANEURYSM ABDOMINAL AORTIC REPAIR WITH HEMASHIELD GOLD VASCULAR GRAFT 22X11 mm (N/A) Stable overall.  Creatinine down to 1.5.  KUB this morning shows continued distention of small bowel but also air in the colon.  Will  attempt replacement of NG tube.  Explained this  to the patient and her son present the importance of continued decompression.  To need to mobilize as much as possible.  Will try fleets enema.   LOS: 11 days     05/09/2018, 8:17 AM

## 2018-05-09 NOTE — Progress Notes (Addendum)
NAME:  Leah Mata, MRN:  [id number], DOB:  1946-01-29, LOS: 11 ADMISSION DATE:  04/28/2018, CONSULTATION DATE:  04/29/2018 REFERRING MD:  Dr. Arbie Cookey, CHIEF COMPLAINT:  AAA  Brief History   11 yoF presenting with acute onset of RLQ pain w/N/V and radiation to back with hypotension found to have ruptured AAA.  Taken to emergently to OR  Past Medical History  Current smoker, COPD, HTN, and HLD   Significant Hospital Events   1/31 Admit-> OR 2/1 extubated 2/2: Pulmonary signed off.  Recommending aggressive pulmonary hygiene measures 2/4:Developing worsening shortness of breath had worsening blood pressure, worsening pulmonary edema and right greater than left pleural effusions.  Blood pressure management escalated, diuretics ordered, placed on BiPAP. 2/5: Pulmonary formally reconsulted, diuretics continued, beta-blockade added given tachycardia, ordered echocardiogram.  Had atrial fibrillation with rapid ventricular response ultimately ended up on amiodarone infusion.  Ultrasound of right chest demonstrated more consolidation and very minimal effusion, started on ceftriaxone for possible aspiration pneumonia 2/6: Chest x-ray clearing.  Oxygen requirements seem to be improving.  Reporting constipation, addressing this with MiraLAX and laxative of choice as well as stool softener.  Holding diuretics due to creatinine bump.  Doppler review shows diastolic dysfunction. 2/7: X-ray continues to improve slowly.  Had acute episode of shortness of breath and upper airway wheezing following refluxing of orange juice.  Suspecting upper airway irritation a large contributing factor to her symptoms.  Still on amiodarone but planning on likely stopping in the 24 hours still has significant constipation 2/11: breathing comforatbly on 4L [place] now. Remains with ileus vs obstruction. NGT came out yesterday.   Consults:  Vascular Cardiology  Procedures:  1/31 OR-> AAA repair 1/31 ETT >> 2/1  Significant  Diagnostic Tests:  1/31 >> CT abd/ pelvis >>8.3 cm infrarenal abdominal aortic aneurysm with large right retroperitoneal hematoma most compatible with aortic rupture. 2/5: Echocardiogram>>> hyperdynamic left ventricle EF greater than 65% abnormal diastolic relaxation no wall motion abnormality Micro Data:  None Antimicrobials:  1/31 Cefazolin preop 2/5: Ceftriaxone Interim history/subjective:  NGT came out yesterday and RNS have been unable to replace. Breathing comfortably.   Objective   Blood pressure (!) 148/63, pulse 75, temperature 97.6 F (36.4 C), temperature source Oral, resp. rate 20, height 5\' 4"  (1.626 m), weight 83.9 kg, last menstrual period 03/29/1993, SpO2 96 %.        Intake/Output Summary (Last 24 hours) at 05/09/2018 0820 Last data filed at 05/09/2018 0600 Gross per 24 hour  Intake 3116.7 ml  Output 1700 ml  Net 1416.7 ml   Filed Weights   04/28/18 1603 05/01/18 0600  Weight: 69.9 kg 83.9 kg   Examination:  General:  Overweight elderly female in NAD HEENT: [place]/AT, PERRL, no JVD Neuro: Intact follows commands moves all extremities CV: RRR no MRG PULM: Clear bilateral breath sounds. Unlabored.  GI: Abdomen distended with hypoactive to absent bowel sounds.  Extremities: warm/dry, 1+ edema  Skin: no rashes or lesions   Resolved Hospital Problem list   Acute blood loss anemia Acute kidney injury  Assessment & Plan:   Acute on chronic hypoxic respiratory failure In the setting of presumed aspiration pneumonia (no organism specified), complicated by element of volume overload with small pleural effusion. Possible underlying obstructive lung disease Long history of tobacco abuse Suspected bibasilar pneumonia  Plan O2 as needed to keep SpO2 > 90% Pulm hygiene  Ileus is impacting on her breathing status Rocephin day 6/7.  Paroxysmal atrial fibrillation with rapid ventricular response  w/ CHAD2VASC of 5  -She now has a controlled ventricular rhythm.  She  is in and out of atrial fibrillation this morning as of 2/7 Plan Remains on amiodarone Cardiology following Lopressor ordered but hypotension has limited its administration. Dose has been decreased by cardiology.   Hypertension with diastolic dysfunction by echocardiogram on 2/5 With hypotension over the last 24 to 48 hours currently off antihypertensive -Systolic blood pressure adequate but has been on the low side -Intermittent atrial fibrillation Plan Antihypertensives currently on hold She has PRN hydralazine as needed  Ruptured infrarenal AAA s/p open repair with EBL ~4L Plan Postoperative management per vascular surgery continuing blood pressure control  At risk for fluid and electrolyte imbalance: With mild AKI following attempted increase in diuretics on 2/5 Plan Monitor creatinine Replace K today.  Holding diuresis due to low blood pressure  Constipation: Ileus Plan Will need to replace NGT. If unable to get placed, may need IR Continue bowel regimen  acute blood loss anemia w/ ebl 4.2 liters.  -Hemoglobin is remained stable over the last 72 hours Plan Monitor hemoglobin  She is likely ready for transfer out of ICU from PCCM standpoint. PCCM will continue to follow while she remains in ICU.  Best practice:  Diet: NPO, chips.  Pain/Anxiety/Delirium NA VAP protocol (if indicated): dc DVT prophylaxis: heparin SQ GI prophylaxis: protonix Glucose control: SSI  Mobility:OOB Code Status: Full  Family Communication: Family updated 2/11 Disposition: Ok fro transfer out of ICU if ok by VVS    Joneen Roach, AGACNP-BC [facility] Pulmonary/Critical Care Pager [phone] or [phone]  05/09/2018 8:24 AM  Attending Note:  73 year old female s/p AAA repair and massive transfusion.  No events overnight now with pulmonary infiltrate on CXR that I reviewed myself.  On exam, bibasilar crackles.  Discussed with PCCM-NP.  Hypoxemia:  - Titrate O2 for sat of  88-92%  Afib:  - Tele monitoring  - Rate control as ordered  Pneumonia:  - D/C rocephin  Dispo:  - Transfer to progressive care  - PCCM will sign off, please call back if needed.  Patient seen and examined, agree with above note.  I dictated the care and orders written for this patient under my direction.  Alyson Reedy, MD [phone]

## 2018-05-09 NOTE — Progress Notes (Signed)
Progress Note  Patient Name: Leah Mata Date of Encounter: 05/09/2018  Primary Cardiologist: No primary care provider on file.   Subjective   No further atrial fibrillation on tele.  Still has distended abdomen.  Inpatient Medications    Scheduled Meds: . chlorhexidine  15 mL Mouth Rinse BID  . docusate sodium  100 mg Oral Daily  . fluticasone  2 spray Each Nare Daily  . heparin  5,000 Units Subcutaneous Q8H  . ipratropium-albuterol  3 mL Nebulization Q6H  . lactulose  30 g Oral BID  . mouth rinse  15 mL Mouth Rinse q12n4p  . metoprolol tartrate  2.5 mg Intravenous Q6H  . pantoprazole (PROTONIX) IV  40 mg Intravenous Q12H  . polyethylene glycol  17 g Oral Daily  . senna-docusate  2 tablet Oral QHS  . sodium chloride flush  10-40 mL Intracatheter Q12H   Continuous Infusions: . sodium chloride Stopped (05/05/18 2047)  . amiodarone 30 mg/hr (05/08/18 2054)  . cefTRIAXone (ROCEPHIN)  IV Stopped (05/08/18 2330)  . dextrose 5 % and 0.9% NaCl 125 mL/hr at 05/08/18 2327  . magnesium sulfate 1 - 4 g bolus IVPB    . potassium chloride 10 mEq (05/09/18 0742)   PRN Meds: sodium chloride, alum & mag hydroxide-simeth, bisacodyl, guaiFENesin-dextromethorphan, hydrALAZINE, levalbuterol, magnesium sulfate 1 - 4 g bolus IVPB, morphine injection, ondansetron, oxyCODONE-acetaminophen, phenol, sodium chloride flush   Vital Signs    Vitals:   05/09/18 0600 05/09/18 0700 05/09/18 0758 05/09/18 0813  BP: 133/63 (!) 148/63    Pulse:      Resp: 16 20    Temp:   97.6 F (36.4 C)   TempSrc:   Oral   SpO2: 100% 94%  96%  Weight:      Height:        Intake/Output Summary (Last 24 hours) at 05/09/2018 0840 Last data filed at 05/09/2018 0600 Gross per 24 hour  Intake 3116.7 ml  Output 1700 ml  Net 1416.7 ml   Filed Weights   04/28/18 1603 05/01/18 0600  Weight: 69.9 kg 83.9 kg    Telemetry  NSR with PACs- Personally Reviewed  ECG    No new EKG to review- Personally  Reviewed  Physical Exam   GEN: Well nourished, well developed in no acute distress HEENT: Normal NECK: No JVD; No carotid bruits LYMPHATICS: No lymphadenopathy CARDIAC:RRR, no murmurs, rubs, gallops RESPIRATORY:  Clear to auscultation without rales, wheezing or rhonchi  ABDOMEN: firm and distended MUSCULOSKELETAL: trace pedal  edema; No deformity  SKIN: Warm and dry NEUROLOGIC:  Alert and oriented x 3 PSYCHIATRIC:  Normal affect    Labs    Chemistry Recent Labs  Lab 05/04/18 0358 05/06/18 0220  05/07/18 0636 05/08/18 0321 05/09/18 0242  NA 138 133*   < > 134* 135 140  K 4.3 3.8   < > 5.0 3.6 3.2*  CL 102 93*   < > 95* 100 104  CO2 25 25   < > 24 25 26   GLUCOSE 119* 127*   < > 115* 151* 136*  BUN 36* 36*   < > 61* 64* 52*  CREATININE 1.22* 1.00   < > 2.00* 2.05* 1.53*  CALCIUM 9.5 9.4   < > 9.5 8.9 9.1  PROT 5.5* 6.0*  --   --   --   --   ALBUMIN 2.4* 2.5*  --   --   --   --   AST 28 28  --   --   --   --  ALT 26 25  --   --   --   --   ALKPHOS 58 79  --   --   --   --   BILITOT 2.6* 3.5*  --   --   --   --   GFRNONAA 45* 57*   < > 24* 24* 34*  GFRAA 52* >60   < > 28* 28* 39*  ANIONGAP 11 15   < > 15 10 10    < > = values in this interval not displayed.     Hematology Recent Labs  Lab 05/07/18 0234 05/08/18 0321 05/09/18 0242  WBC 15.3* 14.5* 15.3*  RBC 5.03 4.34 4.55  HGB 14.4 12.9 13.2  HCT 43.5 37.2 39.5  MCV 86.5 85.7 86.8  MCH 28.6 29.7 29.0  MCHC 33.1 34.7 33.4  RDW 16.3* 16.1* 16.8*  PLT 263 222 222    Cardiac EnzymesNo results for input(s): TROPONINI in the last 168 hours. No results for input(s): TROPIPOC in the last 168 hours.   BNP Recent Labs  Lab 05/03/18 1004 05/07/18 0234  BNP 275.0* 86.8     DDimer No results for input(s): DDIMER in the last 168 hours.   Radiology    Ct Abdomen Pelvis Wo Contrast  Result Date: 05/07/2018 CLINICAL DATA:  Abdominal distention. Bowel obstruction. Abdominal aortic aneurysm repair on 04/28/2018  EXAM: CT ABDOMEN AND PELVIS WITHOUT CONTRAST TECHNIQUE: Multidetector CT imaging of the abdomen and pelvis was performed following the standard protocol without IV contrast. COMPARISON:  04/28/2018 CT scan FINDINGS: Lower chest: Small bilateral pleural effusions with passive atelectasis. There some additional airspace opacity in the right lower lobe which may represent additional atelectasis or pneumonia. Right coronary artery atherosclerotic calcification. Hepatobiliary: Stable small hypodense hepatic lesions, likely cysts or similar benign structures. Contrast medium gallbladder probably from vicarious excretion. No biliary dilatation. Pancreas: Stable atrophic appearance of the pancreas. Spleen: Unremarkable Adrenals/Urinary Tract: Both adrenal glands appear within normal limits. Modest increase in size of the right perirenal and retroperitoneal hematoma, current estimate at about 8.9 by 10.0 by 17.9 cm (volume = 830 cm^3), formerly about 8.7 by 5.6 by 15.7 cm (volume = 400 cm^3). This displaces the right kidney anteriorly. The right ureter is obscured adjacent to the hematoma. There is some mild fullness of the right collecting system but no overt hydronephrosis. Stomach/Bowel: Nasogastric tube in place. Distended small bowel the with air-fluid levels noted, measuring up to 3.8 cm, extending down to a abrupt transition just to the right of midline on image 52/3 where the dilated bowel terminates in and nondilated loops. The distal small bowel loops are nondilated and the colon is likewise not dilated. The appearance is compatible with small bowel obstruction and presumably there is some type of twist or adhesion in the vicinity of the transition no pneumatosis. No swirling mesenteric vessels are observed. Vascular/Lymphatic: Retroperitoneal hematoma. Interval abdominal aortic aneurysm repair is not characterized in detail on today's noncontrast exam. There appears to be a stent graft within the original aneurysm  with residual mural thrombus and marginal calcification along the original aneurysm site. Posterior to the common iliac branches of the bifurcated stent graft there is some high density material within the lumen of the native abdominal aorta, probably representing incidental mixed density blood products, difficult to exclude a true endoleak without contrast. Mixed density fluid tracks around the right common iliac artery, favoring hematoma and increased from 04/28/2018, with a higher density posterior component. Reproductive: Unremarkable Other: Pelvic ascites, probably complex,  pneumoperitoneum not excluded. Increased presacral soft tissue density/ascites. Increased bilateral flank edema in the subcutaneous tissues. Indirect left inguinal hernia containing adipose tissue and complex ascites. Edema tracks along the upper thigh musculature diffusely. Musculoskeletal: Multilevel degenerative facet arthropathy in the lumbar spine. IMPRESSION: 1. Small-bowel obstruction with a transition point in the proximal ileum shown on image 52/3 where there is a slight angulation of a loop of bowel suggesting an adhesion. This angulation is fairly gentle. Small bowel loops proximal to the transition measure up to 3.8 cm in diameter. 2. There is some increase in volume of the right retroperitoneal and perirenal hematoma, previous volume estimate about 400 cubic cm, currently about twice that period 3. Postoperative findings along the aneurysm. I can not exclude an endoleak based on high density components of the thrombosed native aneurysm, today's exam does not include contrast and accordingly this may simply represent mixed density hematoma rather than endoleak. 4. Small bilateral pleural effusions with passive atelectasis. Additional airspace opacity at the right lung base may represent additional atelectasis or pneumonia. 5. Mild complex pelvic ascites, hemoperitoneum not excluded. 6. Aortic and right coronary artery  atherosclerotic calcification. Electronically Signed   By: Gaylyn Rong M.D.   On: 05/07/2018 15:52   Dg Abd 1 View  Result Date: 05/07/2018 CLINICAL DATA:  Followup bowel obstruction. EXAM: ABDOMEN - 1 VIEW COMPARISON:  05/06/2018 FINDINGS: The NG tube is in the body region of the stomach. Interval slight decrease in size of the dilated air-filled small bowel loops. There is some scattered air in the right colon. No free air. IMPRESSION: NG tube in good position. Interval slight decrease in dilated small bowel. Electronically Signed   By: Rudie Meyer M.D.   On: 05/07/2018 11:23   Dg Chest Port 1 View  Result Date: 05/08/2018 CLINICAL DATA:  Shortness of breath, acute respiratory failure. EXAM: PORTABLE CHEST 1 VIEW COMPARISON:  Radiograph of May 05, 2018. FINDINGS: Stable cardiomediastinal silhouette. Atherosclerosis of thoracic aorta is noted. Distal tip of nasogastric tube is seen in proximal stomach. No pneumothorax is noted. Stable bilateral perihilar and basilar opacities are noted with small right pleural effusion. Bony thorax is unremarkable. IMPRESSION: Stable bilateral perihilar and basilar opacities are noted with small right pleural effusion. Aortic Atherosclerosis (ICD10-I70.0). Electronically Signed   By: Lupita Raider, M.D.   On: 05/08/2018 07:55    Cardiac Studies   2D echo 05/03/2018 IMPRESSIONS   1. The left ventricle has hyperdynamic systolic function of >65%. The cavity size is normal. There is no increased left ventricular wall thickness. Echo evidence of impaired diastolic relaxation.  2. The right ventricle has normal systolic function. The cavity in normal in size. There is no increase in right ventricular wall thickness.  3. The mitral valve is normal in structure.  4. The aortic valve has an indeterminant number of cusps.  5. The pulmonic valve is Not well seen. Pulmonic valve regurgitation was not assessed by color flow Doppler.  6. There is mild dilatation  of the aortic root.  7. The interatrial septum was not assessed.  8. Extremely limited due to poor sound wave transmission; definity used; vigorous LV systolic function; mild diastolic dysfunction.  Patient Profile     73 y.o. female with a hx of HTN, HLD and COPD who is being seen today for the evaluation of atrial fibrillation after repair of ruptured AAA   Assessment & Plan     1.  Paroxysmal atrial fibrillation -This occurred in the setting of  aspiration pneumonia and recovery from AAA repair -In review of telemetry over the past 24 hours continues to maintain normal sinus rhythm with no breakthrough atrial fibrillation or flutter.  Her PACs have improved as well. -Continue IV amio and Lopressor 2.5mg  IV q6 hours and change to p.o. once her ileus has resolved  2.  8.3 cm infrarenal AAA -Status post repair per vascular -Continue statin once taking p.o.  3.  Acute respiratory failure -Secondary  to aspiration pneumonia -Per CCM  4.  Hypertension  -BP fairly well controlled at 148/4463mmHg this am -Continue Lopressor IV but decrease to 2.5 mg every 6 hours and hold for SBP <100 -Amlodipine on hold due to ileus  4.  Hyperlipidemia -Continue statin therapy once taking p.o. -LDL this am 68  For questions or updates, please contact [facility] Please consult [email or web address] for contact info under Cardiology/STEMI.      Signed, Armanda Magicraci Tracie Dore, MD  05/09/2018, 8:40 AM

## 2018-05-09 NOTE — Progress Notes (Signed)
KUB results confirmed with Dr. Molli Knock.  May connect NGT to Low Intermittent Suction.

## 2018-05-09 NOTE — Progress Notes (Signed)
Patient transported to 4E09 via wheelchair.  Ambulated and pivoted to bedside. Tolerated well.  Makalya, RN at bedside and connected patient to telemetry monitor.  Patient awake, alert and oriented at time of transfer.

## 2018-05-09 NOTE — Progress Notes (Signed)
eLink Physician-Brief Progress Note Patient Name: Leah Mata DOB: November 03, 1945 MRN: [id number]   Date of Service  05/09/2018  HPI/Events of Note  K+ = 3.2 and Creatinine = 1.53.  eICU Interventions  Will replace K+.      Intervention Category Major Interventions: Electrolyte abnormality - evaluation and management  [name] 05/09/2018, 4:08 AM

## 2018-05-09 NOTE — Progress Notes (Signed)
SLP Cancellation Note  Patient Details Name: Leah Mata MRN: [id number] DOB: 1945-07-06   Cancelled treatment:    Remains NPO due to possible ileus.  Will f/u when appropriate.       Onya Eutsler L. Samson Frederic, MA CCC/SLP Acute Rehabilitation Services Office number [phone] Pager [phone]  Blenda Mounts Laurice 05/09/2018, 10:06 AM

## 2018-05-09 NOTE — Progress Notes (Signed)
Attempted 14 Fr. NGT placement/insertion x1.  Portable KUB ordered.  Awaiting results.  Patient tolerated insertion well.

## 2018-05-10 ENCOUNTER — Inpatient Hospital Stay (HOSPITAL_COMMUNITY): Payer: Medicare PPO

## 2018-05-10 LAB — BASIC METABOLIC PANEL
Anion gap: 8 (ref 5–15)
Anion gap: 11 (ref 5–15)
BUN: 37 mg/dL — ABNORMAL HIGH (ref 8–23)
BUN: 32 mg/dL — ABNORMAL HIGH (ref 8–23)
CO2: 25 mmol/L (ref 22–32)
CO2: 24 mmol/L (ref 22–32)
CREATININE: 1.22 mg/dL — AB (ref 0.44–1.00)
Calcium: 9.3 mg/dL (ref 8.9–10.3)
Calcium: 9.3 mg/dL (ref 8.9–10.3)
Chloride: 107 mmol/L (ref 98–111)
Chloride: 106 mmol/L (ref 98–111)
Creatinine, Ser: 1.15 mg/dL — ABNORMAL HIGH (ref 0.44–1.00)
GFR calc Af Amer: 52 mL/min — ABNORMAL LOW (ref >60)
GFR calc Af Amer: 55 mL/min — ABNORMAL LOW (ref >60)
GFR calc non Af Amer: 45 mL/min — ABNORMAL LOW (ref >60)
GFR calc non Af Amer: 48 mL/min — ABNORMAL LOW (ref >60)
Glucose, Bld: 145 mg/dL — ABNORMAL HIGH (ref 70–99)
Glucose, Bld: 126 mg/dL — ABNORMAL HIGH (ref 70–99)
Potassium: 3.3 mmol/L — ABNORMAL LOW (ref 3.5–5.1)
Potassium: 4.2 mmol/L (ref 3.5–5.1)
Sodium: 140 mmol/L (ref 135–145)
Sodium: 141 mmol/L (ref 135–145)

## 2018-05-10 LAB — CBC
HEMATOCRIT: 36.9 % (ref 36.0–46.0)
Hemoglobin: 12.5 g/dL (ref 12.0–15.0)
MCH: 29.5 pg (ref 26.0–34.0)
MCHC: 33.9 g/dL (ref 30.0–36.0)
MCV: 87 fL (ref 80.0–100.0)
Platelets: 215 10*3/uL (ref 150–400)
RBC: 4.24 MIL/uL (ref 3.87–5.11)
RDW: 17.2 % — AB (ref 11.5–15.5)
WBC: 15.9 10*3/uL — ABNORMAL HIGH (ref 4.0–10.5)
nRBC: 0 % (ref 0.0–0.2)

## 2018-05-10 MED ORDER — DIATRIZOATE MEGLUMINE & SODIUM 66-10 % PO SOLN
90.0000 mL | Freq: Once | ORAL | Status: AC
  Administered 2018-05-10: 90 mL via NASOGASTRIC
  Filled 2018-05-10: qty 90

## 2018-05-10 MED ORDER — FUROSEMIDE 10 MG/ML IJ SOLN
40.0000 mg | Freq: Once | INTRAMUSCULAR | Status: AC
  Administered 2018-05-10: 40 mg via INTRAVENOUS
  Filled 2018-05-10: qty 4

## 2018-05-10 MED ORDER — POTASSIUM CHLORIDE 10 MEQ/100ML IV SOLN
10.0000 meq | INTRAVENOUS | Status: AC
  Administered 2018-05-10 (×4): 10 meq via INTRAVENOUS
  Filled 2018-05-10 (×4): qty 100

## 2018-05-10 MED ORDER — IPRATROPIUM-ALBUTEROL 0.5-2.5 (3) MG/3ML IN SOLN
3.0000 mL | Freq: Three times a day (TID) | RESPIRATORY_TRACT | Status: DC
  Administered 2018-05-11 – 2018-05-30 (×57): 3 mL via RESPIRATORY_TRACT
  Filled 2018-05-10 (×56): qty 3

## 2018-05-10 NOTE — Progress Notes (Signed)
IP rehab admissions - I met with patient.  She anticipates the need for SNF post hospital stay.  We discussed inpatient rehab admission.  I will follow for progress.  Call me for questions.  [phone]

## 2018-05-10 NOTE — Progress Notes (Signed)
  AAA Progress Note    05/10/2018 7:56 AM 12 Days Post-Op  Subjective:  Says she feels better since the NGT was replaced. Says she thinks she passed some gas last night.  Says the incentive spirometer makes her nauseated.    Afebrile HR  70's-80's afib/nsr 140's-180's systolic 95% 3LO2NC  Vitals:   05/10/18 0716 05/10/18 0750  BP:  (!) 168/76  Pulse:  82  Resp:  18  Temp:  98 F (36.7 C)  SpO2: 97% 95%    Physical Exam: Cardiac:  regular Lungs:  Non labored  Abdomen:  Soft/slight distention; occasional bowel sounds Incisions:  Clean and dry laparotomy incision    CBC    Component Value Date/Time   WBC 15.9 (H) 05/10/2018 0320   RBC 4.24 05/10/2018 0320   HGB 12.5 05/10/2018 0320   HCT 36.9 05/10/2018 0320   PLT 215 05/10/2018 0320   MCV 87.0 05/10/2018 0320   MCH 29.5 05/10/2018 0320   MCHC 33.9 05/10/2018 0320   RDW 17.2 (H) 05/10/2018 0320   LYMPHSABS 0.9 05/09/2018 0242   MONOABS 2.1 (H) 05/09/2018 0242   EOSABS 0.1 05/09/2018 0242   BASOSABS 0.1 05/09/2018 0242    BMET    Component Value Date/Time   NA 140 05/10/2018 0320   K 3.3 (L) 05/10/2018 0320   CL 107 05/10/2018 0320   CO2 25 05/10/2018 0320   GLUCOSE 145 (H) 05/10/2018 0320   BUN 37 (H) 05/10/2018 0320   CREATININE 1.22 (H) 05/10/2018 0320   CALCIUM 9.3 05/10/2018 0320   GFRNONAA 45 (L) 05/10/2018 0320   GFRAA 52 (L) 05/10/2018 0320    INR    Component Value Date/Time   INR 1.16 04/30/2018 0413     Intake/Output Summary (Last 24 hours) at 05/10/2018 0756 Last data filed at 05/10/2018 0453 Gross per 24 hour  Intake 2923.7 ml  Output 2225 ml  Net 698.7 ml     Assessment/Plan:  73 y.o. female is s/p  Open repair of ruptured infrarenal abdominal aortic aneurysm 12 Days Post-Op  GI: NGT with ~800cc out since it was replaced.  Keep NGT.  Abdomen soft but distended.   Non tender to palpation and occasional bowel sounds.  She had flatus last night.  Continue npo.  May use mouth  swabs.   Continues to have dilated bowel. Cardiac:  NSR with one reported episode of afib last evening-appreciate cardiology assistance Renal:  creatinine improved to 1.22 from 1.53 yesterday Lungs:  97% on 3LO2NCcontinue to ambulate and OOB.  Work on Oncologist; PCXR from yesterday shows persistent perihilar and right basilar opacity and right pleural effusion Heme:  still with leukocytosis but stable around 15k-pt afebrile and incision is okay.  Ceftriaxone discontinued on 05/08/2018 Neuro: in tact     Doreatha Massed, PA-C [facility] [phone] 05/10/2018 7:56 AM

## 2018-05-10 NOTE — Progress Notes (Signed)
SLP Cancellation Note  Patient Details Name: Leah Mata MRN: [id number] DOB: 1945/11/30   Cancelled treatment:       Reason Eval/Treat Not Completed: Medical issues which prohibited therapy (remains NPO due to ileus).   Virl Axe [name] 05/10/2018, 8:24 AM  Maxcine Ham [name], M.A. CCC-SLP Acute Herbalist [phone] Office [phone]

## 2018-05-10 NOTE — Progress Notes (Addendum)
Progress Note  Patient Name: Leah Mata Date of Encounter: 05/10/2018  Primary Cardiologist: No primary care provider on file.   Subjective   Still not taking PO.  No afib or flutter on tele  Inpatient Medications    Scheduled Meds: . chlorhexidine  15 mL Mouth Rinse BID  . docusate sodium  100 mg Oral Daily  . fluticasone  2 spray Each Nare Daily  . heparin  5,000 Units Subcutaneous Q8H  . ipratropium-albuterol  3 mL Nebulization Q6H  . lactulose  30 g Oral BID  . mouth rinse  15 mL Mouth Rinse q12n4p  . metoprolol tartrate  2.5 mg Intravenous Q6H  . pantoprazole (PROTONIX) IV  40 mg Intravenous Q12H  . polyethylene glycol  17 g Oral Daily  . senna-docusate  2 tablet Oral QHS  . sodium chloride flush  10-40 mL Intracatheter Q12H  . sodium phosphate  1 enema Rectal Once   Continuous Infusions: . sodium chloride Stopped (05/05/18 2047)  . amiodarone 30 mg/hr (05/10/18 0300)  . dextrose 5 % and 0.9% NaCl 125 mL/hr at 05/10/18 0452  . magnesium sulfate 1 - 4 g bolus IVPB     PRN Meds: sodium chloride, alum & mag hydroxide-simeth, bisacodyl, guaiFENesin-dextromethorphan, hydrALAZINE, levalbuterol, magnesium sulfate 1 - 4 g bolus IVPB, morphine injection, ondansetron, oxyCODONE-acetaminophen, phenol, sodium chloride flush   Vital Signs    Vitals:   05/10/18 0437 05/10/18 0716 05/10/18 0750 05/10/18 0800  BP: (!) 163/73  (!) 168/76   Pulse: 79  82 83  Resp: 18  18 (!) 35  Temp: 98.3 F (36.8 C)  98 F (36.7 C)   TempSrc: Oral  Oral   SpO2: 93% 97% 95% 94%  Weight:      Height:        Intake/Output Summary (Last 24 hours) at 05/10/2018 0910 Last data filed at 05/10/2018 0900 Gross per 24 hour  Intake 2640.36 ml  Output 3025 ml  Net -384.64 ml   Filed Weights   04/28/18 1603 05/01/18 0600  Weight: 69.9 kg 83.9 kg    Telemetry  NSR- Personally Reviewed  ECG    No new EKG to review- Personally Reviewed  Physical Exam   GEN: Well nourished, well  developed in no acute distress HEENT: Normal NECK: No JVD; No carotid bruits LYMPHATICS: No lymphadenopathy CARDIAC:RRR, no murmurs, rubs, gallops RESPIRATORY:scattered rhonchi ABDOMEN: abdomen distended MUSCULOSKELETAL:  2+ LE pitting edema; No deformity  SKIN: Warm and dry NEUROLOGIC:  Alert and oriented x 3 PSYCHIATRIC:  Normal affect     Labs    Chemistry Recent Labs  Lab 05/04/18 0358 05/06/18 0220  05/08/18 0321 05/09/18 0242 05/10/18 0320  NA 138 133*   < > 135 140 140  K 4.3 3.8   < > 3.6 3.2* 3.3*  CL 102 93*   < > 100 104 107  CO2 25 25   < > 25 26 25   GLUCOSE 119* 127*   < > 151* 136* 145*  BUN 36* 36*   < > 64* 52* 37*  CREATININE 1.22* 1.00   < > 2.05* 1.53* 1.22*  CALCIUM 9.5 9.4   < > 8.9 9.1 9.3  PROT 5.5* 6.0*  --   --   --   --   ALBUMIN 2.4* 2.5*  --   --   --   --   AST 28 28  --   --   --   --   ALT 26  25  --   --   --   --   ALKPHOS 58 79  --   --   --   --   BILITOT 2.6* 3.5*  --   --   --   --   GFRNONAA 45* 57*   < > 24* 34* 45*  GFRAA 52* >60   < > 28* 39* 52*  ANIONGAP 11 15   < > 10 10 8    < > = values in this interval not displayed.     Hematology Recent Labs  Lab 05/08/18 0321 05/09/18 0242 05/10/18 0320  WBC 14.5* 15.3* 15.9*  RBC 4.34 4.55 4.24  HGB 12.9 13.2 12.5  HCT 37.2 39.5 36.9  MCV 85.7 86.8 87.0  MCH 29.7 29.0 29.5  MCHC 34.7 33.4 33.9  RDW 16.1* 16.8* 17.2*  PLT 222 222 215    Cardiac EnzymesNo results for input(s): TROPONINI in the last 168 hours. No results for input(s): TROPIPOC in the last 168 hours.   BNP Recent Labs  Lab 05/03/18 1004 05/07/18 0234  BNP 275.0* 86.8     DDimer No results for input(s): DDIMER in the last 168 hours.   Radiology    Dg Abd 1 View  Result Date: 05/09/2018 CLINICAL DATA:  73 year old female with orogastric tube placement. Subsequent encounter. EXAM: ABDOMEN - 1 VIEW COMPARISON:  05/09/2018. FINDINGS: Nasogastric tube placed with tip at the expected gastric  pylorus/proximal duodenum level and side hole gastric antrum level. Gas-filled stomach. Gas distended small bowel loops as noted on prior exam. No free air detected. Hazy opacification lung bases greater on the right.  Cardiomegaly. IMPRESSION: 1. Nasogastric tube placed with tip at the gastric pylorus/proximal duodenum level and side hole gastric antrum level. Gas-filled stomach. 2. Gas distended small bowel loops as noted on prior exam. Electronically Signed   By: Lacy DuverneySteven  Olson M.D.   On: 05/09/2018 13:08   Dg Abd 1 View  Result Date: 05/09/2018 CLINICAL DATA:  abd tenderness,ileus EXAM: ABDOMEN - 1 VIEW COMPARISON:  CT of the abdomen and pelvis on 05/07/2018 FINDINGS: There is persistent significant small bowel dilatation. Loops of colon contain air and stool but are not dilated. There is no evidence for free intraperitoneal air on the supine view performed. IMPRESSION: Persistent small bowel obstruction. Electronically Signed   By: Norva PavlovElizabeth  Brown M.D.   On: 05/09/2018 08:52   Dg Chest Port 1 View  Result Date: 05/09/2018 CLINICAL DATA:  Sob,resp failure ,,hx pleural effusion EXAM: PORTABLE CHEST 1 VIEW COMPARISON:  05/08/2018 FINDINGS: Nasogastric tube has been removed. Shallow lung inflation. Heart size is accentuated by technique. There is persistent perihilar and RIGHT basilar opacity. RIGHT pleural effusion. IMPRESSION: 1. Shallow lung inflation. 2. Persistent perihilar and RIGHT basilar opacity and RIGHT pleural effusion. Electronically Signed   By: Norva PavlovElizabeth  Brown M.D.   On: 05/09/2018 08:50    Cardiac Studies   2D echo 05/03/2018 IMPRESSIONS   1. The left ventricle has hyperdynamic systolic function of >65%. The cavity size is normal. There is no increased left ventricular wall thickness. Echo evidence of impaired diastolic relaxation.  2. The right ventricle has normal systolic function. The cavity in normal in size. There is no increase in right ventricular wall thickness.  3. The  mitral valve is normal in structure.  4. The aortic valve has an indeterminant number of cusps.  5. The pulmonic valve is Not well seen. Pulmonic valve regurgitation was not assessed by color flow Doppler.  6.  There is mild dilatation of the aortic root.  7. The interatrial septum was not assessed.  8. Extremely limited due to poor sound wave transmission; definity used; vigorous LV systolic function; mild diastolic dysfunction.  Patient Profile     73 y.o. female with a hx of HTN, HLD and COPD who is being seen today for the evaluation of atrial fibrillation after repair of ruptured AAA   Assessment & Plan     1.  Paroxysmal atrial fibrillation -This occurred in the setting of aspiration pneumonia and recovery from AAA repair -Remains in NSR on IV Amio -Continue IV amio and Lopressor 2.5mg  IV q6 hours and change to p.o. once her ileus has resolved  2.  8.3 cm infrarenal AAA -Status post repair per vascular -Continue statin once taking p.o.  3.  Acute respiratory failure -Secondary  to aspiration pneumonia -Per CCM  4.  Hypertension  -BP trending upward to 168/52mmHg this am -Continue Lopressor 2.5mg  IV q6 hours -she has LE edema on exam so will give Lasix 40mg  IV this am -will need to follow K+ closely -Amlodipine on hold due to ileus  4.  Hyperlipidemia -Continue statin therapy once taking p.o. -LDL 68 this admit  5.  Hypokalemia -replete to keep K+>4 - K+ likely low due to increase NG output/ileus -will give 4 runs of KCL IV each -check BMET 2 hours after last dose -will need to follow closely since we are giving her Lasix for volume overload  6.  LE edema -she has 2+ pitting LE edema today -will give a dose of Lasix 40mg  IV this am -need to follow K+ closely -consider compression hose -albumin low and likely 3rd spacing - may need to consider albumin with lasix if she continues to 3rd space  For questions or updates, please contact [facility] Please  consult [email or web address] for contact info under Cardiology/STEMI.      Signed, Armanda Magic, MD  05/10/2018, 9:10 AM

## 2018-05-10 NOTE — Progress Notes (Signed)
PHARMACY - ADULT TOTAL PARENTERAL NUTRITION CONSULT NOTE   Pharmacy Consult for TPN  Indication: Prolonged ileus  Patient Measurements: Height: 5\' 4"  (162.6 cm) Weight: 184 lb 15.5 oz (83.9 kg) IBW/kg (Calculated) : 54.7 TPN AdjBW (KG): 69.9 Body mass index is 31.75 kg/m. Usual Weight:   Assessment: Ruptured AAA repair.   PMH: HTN, HLD, COPD  GI: Albumin 2.5 (2/8). NGT in place. 300cc out 2/11.  +stool 2/11. Pt notes flatus. IV PPI/12hrs, Miralax, SennaS,   Endo: no h/o DM note Insulin requirements in the past 24 hours:   Lytes:K 3.3 low. Mg/Phos WNL  Renal: Scr 1.22 declining.  Pulm: Flonase, duonebs,  Cards: Post-op afib. IV metoprolol, IV Amio  Hepatobil: TG 244  Neuro:  ID: WBC 15.9  TPN Access: TPN start date: Nutritional Goals (per RD recommendation on  ): kCal: Protein:  Fluid:  Goal TPN rate is   ml/hr (provides  )  Current Nutrition:   Plan:  Need order for PICC line. Messaged MD and vascular PA D5NS at 129ml/hr Baseline TPN labs for AM TPN to begin 2/13. RD consult for goals.  Colandra Ohanian S. Merilynn Finland, PharmD, BCPS Clinical Staff Pharmacist Misty Stanley Stillinger 05/10/2018,2:23 PM

## 2018-05-10 NOTE — Progress Notes (Signed)
Physical Therapy Treatment Patient Details Name: Leah Mata MRN: [id number] DOB: Oct 28, 1945 Today's Date: 05/10/2018    History of Present Illness Pt is a 73 y.o. F with significant PMH of COPD who presents with abdominal and right flank pain. 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 S/p aneurysm abdominal aortic repair. Hospitalization complicated by worsening SOB requiring bipap on 2/4 due to pulmonary effusion, tachycardia 2/5 requiring amiodorone drip.    PT Comments    Pt was seen for mobility with RW and assisted by CNA as pt is fluctuating on O2 sats for 3L. Walked with her on the hall, required one standing rest to recover from 88% to 94%.  Pt is motivated and working with PT well, and returned to bed with very upright posture with NG tube in place for nsg to reattach to suction.  Follow her acutely for progression of her gait distance, work on balance and restore her safety with all standing activities toward return home after CIR.   Follow Up Recommendations  CIR     Equipment Recommendations  Rolling walker with 5" wheels;3in1 (PT)    Recommendations for Other Services       Precautions / Restrictions Precautions Precautions: Fall Precaution Comments: watch HR and 02; NG tube Restrictions Weight Bearing Restrictions: No    Mobility  Bed Mobility Overal bed mobility: Needs Assistance Bed Mobility: Supine to Sit;Sit to Supine Rolling: Min assist   Supine to sit: Min assist Sit to supine: Min assist   General bed mobility comments: using bedrail and cues for body mechanics  Transfers Overall transfer level: Needs assistance Equipment used: Rolling walker (2 wheeled) Transfers: Sit to/from Stand Sit to Stand: Min assist            Ambulation/Gait Ambulation/Gait assistance: Min assist;+2 physical assistance;+2 safety/equipment(second person to monitor IV pole and PT assisted gait and  O2) Gait Distance (Feet): 130 Feet Assistive device: Rolling walker (2 wheeled) Gait Pattern/deviations: Step-through pattern;Decreased stride length;Wide base of support Gait velocity: decreased Gait velocity interpretation: <1.31 ft/sec, indicative of household ambulator General Gait Details: 3L O2 and maintained O2 sats with one rest for 88%   Stairs             Wheelchair Mobility    Modified Rankin (Stroke Patients Only)       Balance Overall balance assessment: Needs assistance Sitting-balance support: Feet supported Sitting balance-[name]: Good     Standing balance support: Bilateral upper extremity supported;During functional activity Standing balance-[name]: Poor                              Cognition Arousal/Alertness: Awake/alert Behavior During Therapy: WFL for tasks assessed/performed Overall Cognitive Status: Within Functional Limits for tasks assessed                                        Exercises      General Comments        Pertinent Vitals/Pain Pain Assessment: 0-10 Pain Score: 0-No pain    Home Living                      Prior Function            PT Goals (current goals can now be found in the  care plan section) Acute Rehab PT Goals Patient Stated Goal: to get home Progress towards PT goals: Progressing toward goals    Frequency    Min 3X/week      PT Plan Current plan remains appropriate    Co-evaluation              AM-PAC PT "6 Clicks" Mobility   Outcome Measure  Help needed turning from your back to your side while in a flat bed without using bedrails?: A Little Help needed moving from lying on your back to sitting on the side of a flat bed without using bedrails?: A Little Help needed moving to and from a bed to a chair (including a wheelchair)?: A Little Help needed standing up from a chair using your arms (e.g., wheelchair or bedside chair)?: A  Little Help needed to walk in hospital room?: A Lot Help needed climbing 3-5 steps with a railing? : Total 6 Click Score: 15    End of Session Equipment Utilized During Treatment: Oxygen;Gait belt Activity Tolerance: Patient limited by fatigue Patient left: in bed;with call bell/phone within reach;with bed alarm set;with family/visitor present Nurse Communication: Mobility status PT Visit Diagnosis: Unsteadiness on feet (R26.81);Muscle weakness (generalized) (M62.81)     Time: 4825-0037 PT Time Calculation (min) (ACUTE ONLY): 40 min  Charges:  $Gait Training: 8-22 mins $Therapeutic Activity: 23-37 mins                    Ivar Drape 05/10/2018, 5:36 PM  Samul Dada, PT MS Acute Rehab Dept. Number: [facility] [id number] and [facility] [phone]

## 2018-05-10 NOTE — Consult Note (Signed)
[facility] Surgery Consult Note  Leah Mata [birth year]  [id number].    Requesting MD: Gretta Began Chief Complaint/Reason for Consult: SBO vs Ileus  HPI:  Leah Mata is a 73yo female PMH current smoker, COPD, HTN, and HLD, who was admitted to [facility] 1/31 with ruptured infrarenal abdominal aortic aneurysm and underwent open repair that evening by Dr. Gretta Began. She has had a complicated postoperative course including the development of paroxysmal atrial fibrillation as well as aspiration pneumonia. On 2/8 she developed abdominal distension. Xray concerning for postoperative ileus versus SBO therefore NG tube was placed. CT abdomen/pelvis wo contrast was obtained 2/9 and revealed distended small bowel loops with transition point in the proximal ileum. Patient has failed to improve despite bowel rest, therefore general surgery asked to see. Patient states that she still feels bloated. Denies any n/v since NG tube placement. No BM since admission. States that she is passing a small amount of flatus.  Only prior abdominal surgery is a tubal ligation.  Employment: works part time at [employer]  ROS: Review of Systems  Constitutional: Negative.   HENT: Negative.   Eyes: Negative.   Respiratory: Positive for cough. Negative for shortness of breath.   Cardiovascular: Negative.   Gastrointestinal: Positive for abdominal pain and constipation.  Genitourinary: Negative.   Musculoskeletal: Negative.   Skin: Negative.   Neurological: Negative.     All systems reviewed and otherwise negative except for as above  Family History  Problem Relation Age of Onset  . Coronary artery disease Other        female 1st degree relative <60 and female 1st degree relative <50  . Diabetes Father   . Heart attack Father   . Diabetes Mother   . Hypertension Mother   . Congestive Heart Failure Mother   . Colon cancer Neg Hx   . Breast cancer Neg Hx     Past Medical History:  Diagnosis Date  . BACK PAIN  03/14/2009  . COPD 11/01/2006  . GLUCOSE INTOLERANCE 03/14/2009  . Headache(784.0) 03/14/2009  . HYPERTENSION 11/01/2006  . 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 INSOMNIA, HX OF 01/06/2007  . Other and unspecified hyperlipidemia 08/22/2013  . Urinary frequency 03/14/2009    Past Surgical History:  Procedure Laterality Date  . ABDOMINAL AORTIC ANEURYSM REPAIR N/A 04/28/2018   Procedure: ANEURYSM ABDOMINAL AORTIC REPAIR WITH HEMASHIELD GOLD VASCULAR GRAFT 22X11 mm;  Surgeon: Larina Earthly, MD;  [facility] OR;  Service: Vascular;  Laterality: N/A;  . CARDIAC CATHETERIZATION  2003   negative  . DILATION AND CURETTAGE OF UTERUS    . TONSILLECTOMY    . TUBAL LIGATION      Social History:  reports that she has been smoking. She has never used smokeless tobacco. She reports that she does not drink alcohol or use drugs.  Allergies:  Allergies  Allergen Reactions  . Hydrochlorothiazide     Medications Prior to Admission  Medication Sig Dispense Refill  . albuterol (VENTOLIN HFA) 108 (90 Base) MCG/ACT inhaler Inhale 2 puffs into the lungs every 6 (six) hours as needed for wheezing or shortness of breath. 3 Inhaler 3  . amLODipine (NORVASC) 5 MG tablet Take 1 tablet (5 mg total) by mouth daily. 90 tablet 3  . fluticasone (FLONASE) 50 MCG/ACT nasal spray Place 2 sprays into both nostrils daily. 48 g 3  . lisinopril (PRINIVIL,ZESTRIL) 20 MG tablet Take 1 tablet (20 mg total) by mouth daily. 90 tablet 3  . pravastatin (  PRAVACHOL) 20 MG tablet Take 1 tablet (20 mg total) by mouth daily. 90 tablet 3  . doxycycline (VIBRA-TABS) 100 MG tablet Take 1 tablet (100 mg total) by mouth 2 (two) times daily. (Patient not taking: Reported on 04/28/2018) 20 tablet 0  . predniSONE (DELTASONE) 10 MG tablet 3 tabs by mouth per day for 3 days,2tabs per day for 3 days,1tab per day for 3 days (Patient not taking: Reported on 04/28/2018) 18 tablet 0  . silver sulfADIAZINE (SILVADENE) 1 % cream Apply 1 application  topically daily. (Patient not taking: Reported on 04/28/2018) 50 g 0  . temazepam (RESTORIL) 15 MG capsule TAKE  1 CAPSULES BY MOUTH AT BEDTIME AS NEEDED (Patient not taking: Reported on 04/28/2018) 90 capsule 1    Prior to Admission medications   Medication Sig Start Date End Date Taking? Authorizing Provider  albuterol (VENTOLIN HFA) 108 (90 Base) MCG/ACT inhaler Inhale 2 puffs into the lungs every 6 (six) hours as needed for wheezing or shortness of breath. 03/12/17  Yes Corwin LevinsJohn, James W, MD  amLODipine (NORVASC) 5 MG tablet Take 1 tablet (5 mg total) by mouth daily. 04/06/18  Yes Corwin LevinsJohn, James W, MD  fluticasone [facility](FLONASE) 50 MCG/ACT nasal spray Place 2 sprays into both nostrils daily. 03/12/17 04/28/18 Yes Corwin LevinsJohn, James W, MD  lisinopril (PRINIVIL,ZESTRIL) 20 MG tablet Take 1 tablet (20 mg total) by mouth daily. 04/06/18  Yes Corwin LevinsJohn, James W, MD  pravastatin (PRAVACHOL) 20 MG tablet Take 1 tablet (20 mg total) by mouth daily. 04/06/18  Yes Corwin LevinsJohn, James W, MD  doxycycline (VIBRA-TABS) 100 MG tablet Take 1 tablet (100 mg total) by mouth 2 (two) times daily. Patient not taking: Reported on 04/28/2018 04/06/18   Corwin LevinsJohn, James W, MD  predniSONE (DELTASONE) 10 MG tablet 3 tabs by mouth per day for 3 days,2tabs per day for 3 days,1tab per day for 3 days Patient not taking: Reported on 04/28/2018 04/06/18   Corwin LevinsJohn, James W, MD  silver sulfADIAZINE (SILVADENE) 1 % cream Apply 1 application topically daily. Patient not taking: Reported on 04/28/2018 11/17/17   Myrlene Brokerrawford, Elizabeth A, MD  temazepam (RESTORIL) 15 MG capsule TAKE  1 CAPSULES BY MOUTH AT BEDTIME AS NEEDED Patient not taking: Reported on 04/28/2018 03/12/17   Corwin LevinsJohn, James W, MD    Blood pressure (!) 149/71, pulse 74, temperature 97.6 F (36.4 C), temperature source Oral, resp. rate (!) 23, height 5\' 4"  (1.626 m), weight 83.9 kg, last menstrual period 03/29/1993, SpO2 99 %. Physical Exam: General: pleasant, WD/WN white female who is laying in bed in NAD HEENT: head is  normocephalic, atraumatic.  Sclera are noninjected.  Pupils equal and round.  Ears and nose without any masses or lesions.  Mouth is pink and moist. Dentition fair Heart: regular.  No obvious murmurs, gallops, or rubs noted.  Palpable pedal pulses bilaterally Lungs: few rhonchi bilaterally, no wheezes.  Respiratory effort nonlabored Abd: midline incision cdi with staples intact and no erythema or drainage, soft, mild distension, mild diffuse tenderness, no rebound or guarding, few BS heard, no masses, hernias, or organomegaly MS: 2+ edema BLE Skin: warm and dry with no masses, lesions, or rashes Psych: A&Ox3 with an appropriate affect. Neuro: cranial nerves grossly intact, extremity CSM intact bilaterally, normal speech  Results for orders placed or performed during the hospital encounter of 04/28/18 (from the past 48 hour(s))  Lipid panel     Status: Abnormal   Collection Time: 05/09/18  2:42 AM  Result Value Ref Range  Cholesterol 136 0 - 200 mg/dL   Triglycerides 324 (H) <150 mg/dL   HDL 19 (L) >40 mg/dL   Total CHOL/HDL Ratio 7.2 RATIO   VLDL 49 (H) 0 - 40 mg/dL   LDL Cholesterol 68 0 - 99 mg/dL    Comment:        Total Cholesterol/HDL:CHD Risk Coronary Heart Disease Risk Table                     Men   Women  1/2 Average Risk   3.4   3.3  Average Risk       5.0   4.4  2 X Average Risk   9.6   7.1  3 X Average Risk  23.4   11.0        Use the calculated Patient Ratio above and the CHD Risk Table to determine the patient's CHD Risk.        ATP III CLASSIFICATION (LDL):  <100     mg/dL   Optimal  102-725  mg/dL   Near or Above                    Optimal  130-159  mg/dL   Borderline  366-440  mg/dL   High  >347     mg/dL   Very High Performed at [facility], [address] [place], [place] [address]   CBC with Differential/Platelet     Status: Abnormal   Collection Time: 05/09/18  2:42 AM  Result Value Ref Range   WBC 15.3 (H) 4.0 - 10.5 K/uL   RBC 4.55 3.87 -  5.11 MIL/uL   Hemoglobin 13.2 12.0 - 15.0 g/dL   HCT 63.8 75.6 - 43.3 %   MCV 86.8 80.0 - 100.0 fL   MCH 29.0 26.0 - 34.0 pg   MCHC 33.4 30.0 - 36.0 g/dL   RDW 29.5 (H) 18.8 - 41.6 %   Platelets 222 150 - 400 K/uL   nRBC 0.0 0.0 - 0.2 %   Neutrophils Relative % 78 %   Neutro Abs 11.8 (H) 1.7 - 7.7 K/uL   Lymphocytes Relative 6 %   Lymphs Abs 0.9 0.7 - 4.0 K/uL   Monocytes Relative 14 %   Monocytes Absolute 2.1 (H) 0.1 - 1.0 K/uL   Eosinophils Relative 0 %   Eosinophils Absolute 0.1 0.0 - 0.5 K/uL   Basophils Relative 0 %   Basophils Absolute 0.1 0.0 - 0.1 K/uL   Immature Granulocytes 2 %   Abs Immature Granulocytes 0.36 (H) 0.00 - 0.07 K/uL    Comment: Performed at [facility], [address], [place], [place] [address]  Basic metabolic panel     Status: Abnormal   Collection Time: 05/09/18  2:42 AM  Result Value Ref Range   Sodium 140 135 - 145 mmol/L   Potassium 3.2 (L) 3.5 - 5.1 mmol/L   Chloride 104 98 - 111 mmol/L   CO2 26 22 - 32 mmol/L   Glucose, Bld 136 (H) 70 - 99 mg/dL   BUN 52 (H) 8 - 23 mg/dL   Creatinine, Ser 1.60 (H) 0.44 - 1.00 mg/dL   Calcium 9.1 8.9 - 10.9 mg/dL   GFR calc non Af Amer 34 (L) >60 mL/min   GFR calc Af Amer 39 (L) >60 mL/min   Anion gap 10 5 - 15    Comment: Performed at [facility], [address], [place],  [place] [address]  Magnesium     Status: None   Collection Time: 05/09/18  2:42 AM  Result Value Ref Range   Magnesium 2.3 1.7 - 2.4 mg/dL    Comment: Performed at [facility], [address] [place], [place] [address]  Phosphorus     Status: None   Collection Time: 05/09/18  2:42 AM  Result Value Ref Range   Phosphorus 2.6 2.5 - 4.6 mg/dL    Comment: Performed at [facility], [address] [place], [place] [address]  Basic metabolic panel     Status: Abnormal   Collection Time: 05/10/18  3:20 AM  Result Value Ref Range   Sodium 140 135 - 145 mmol/L   Potassium 3.3 (L) 3.5 - 5.1 mmol/L   Chloride 107  98 - 111 mmol/L   CO2 25 22 - 32 mmol/L   Glucose, Bld 145 (H) 70 - 99 mg/dL   BUN 37 (H) 8 - 23 mg/dL   Creatinine, Ser 9.14 (H) 0.44 - 1.00 mg/dL   Calcium 9.3 8.9 - 78.2 mg/dL   GFR calc non Af Amer 45 (L) >60 mL/min   GFR calc Af Amer 52 (L) >60 mL/min   Anion gap 8 5 - 15    Comment: Performed at [facility], [address], [place], [place] [address]  CBC     Status: Abnormal   Collection Time: 05/10/18  3:20 AM  Result Value Ref Range   WBC 15.9 (H) 4.0 - 10.5 K/uL   RBC 4.24 3.87 - 5.11 MIL/uL   Hemoglobin 12.5 12.0 - 15.0 g/dL   HCT 30.8 65.7 - 84.6 %   MCV 87.0 80.0 - 100.0 fL   MCH 29.5 26.0 - 34.0 pg   MCHC 33.9 30.0 - 36.0 g/dL   RDW 96.2 (H) 95.2 - 84.1 %   Platelets 215 150 - 400 K/uL   nRBC 0.0 0.0 - 0.2 %    Comment: Performed at [facility], [address] [place], [place] [address]   Dg Abd 1 View - Kub  Result Date: 05/10/2018 CLINICAL DATA:  Postoperative ileus EXAM: ABDOMEN - 1 VIEW COMPARISON:  Yesterday FINDINGS: Nasogastric tube with tip near the pylorus. Unchanged diffuse gaseous distension of bowel, preferentially affecting small bowel. No concerning mass effect or calcification. The stomach is distended by fluid and gas. IMPRESSION: 1. History of ileus with persistent generalized bowel distension. 2. Stomach appears moderately distended by fluid and gas despite nasoenteric tube. Electronically Signed   By: Marnee Spring M.D.   On: 05/10/2018 09:23   Dg Abd 1 View  Result Date: 05/09/2018 CLINICAL DATA:  73 year old female with orogastric tube placement. Subsequent encounter. EXAM: ABDOMEN - 1 VIEW COMPARISON:  05/09/2018. FINDINGS: Nasogastric tube placed with tip at the expected gastric pylorus/proximal duodenum level and side hole gastric antrum level. Gas-filled stomach. Gas distended small bowel loops as noted on prior exam. No free air detected. Hazy opacification lung bases greater on the right.  Cardiomegaly. IMPRESSION: 1.  Nasogastric tube placed with tip at the gastric pylorus/proximal duodenum level and side hole gastric antrum level. Gas-filled stomach. 2. Gas distended small bowel loops as noted on prior exam. Electronically Signed   By: Lacy Duverney M.D.   On: 05/09/2018 13:08   Dg Abd 1 View  Result Date: 05/09/2018 CLINICAL DATA:  abd tenderness,ileus EXAM: ABDOMEN - 1 VIEW COMPARISON:  CT of the abdomen and pelvis on 05/07/2018 FINDINGS: There  is persistent significant small bowel dilatation. Loops of colon contain air and stool but are not dilated. There is no evidence for free intraperitoneal air on the supine view performed. IMPRESSION: Persistent small bowel obstruction. Electronically Signed   By: Norva PavlovElizabeth  Brown M.D.   On: 05/09/2018 08:52   Dg Chest Port 1 View  Result Date: 05/09/2018 CLINICAL DATA:  Sob,resp failure ,,hx pleural effusion EXAM: PORTABLE CHEST 1 VIEW COMPARISON:  05/08/2018 FINDINGS: Nasogastric tube has been removed. Shallow lung inflation. Heart size is accentuated by technique. There is persistent perihilar and RIGHT basilar opacity. RIGHT pleural effusion. IMPRESSION: 1. Shallow lung inflation. 2. Persistent perihilar and RIGHT basilar opacity and RIGHT pleural effusion. Electronically Signed   By: Norva PavlovElizabeth  Brown M.D.   On: 05/09/2018 08:50   Anti-infectives (From admission, onward)   Start     Dose/Rate Route Frequency Ordered Stop   05/03/18 1815  cefTRIAXone (ROCEPHIN) 1 g in sodium chloride 0.9 % 100 mL IVPB  Status:  Discontinued     1 g 200 mL/hr over 30 Minutes Intravenous Every 24 hours 05/03/18 1728 05/09/18 1148   04/29/18 0400  ceFAZolin (ANCEF) IVPB 2g/100 mL premix     2 g 200 mL/hr over 30 Minutes Intravenous Every 8 hours 04/29/18 0044 04/29/18 1259        Assessment/Plan Current smoker COPD HTN HLD Ruptured infrarenal abdominal aortic aneurysm s/p open repair 04/28/18 Dr. Tawanna Coolerodd Early Paroxysmal atrial fibrillation  SBO vs Ileus - Patient with SBO vs  ileus. She is 12 days postop from AAA repair. Agree with NG tube for decompression. Will start small bowel protocol with gastrograffin per NG and delayed abdominal film. Continue to monitor electrolytes and keep K>4 and Mag>2. Patient has had limited nutritional support this hospital stay so I would recommend starting her on TPN. Will continue to follow.  ID - none currently VTE - sq heparin FEN - IVF, NPO/NGT Foley - none Follow up - TBD   Franne FortsBrooke A Charlann Wayne, [facility] 05/10/2018, 2:19 PM Pager: [phone] Mon-Thurs 7:00 am-4:30 pm Fri 7:00 am -11:30 AM Sat-Sun 7:00 am-11:30 am

## 2018-05-10 NOTE — Progress Notes (Addendum)
Patient had about 800 ml of gastric contents out in an hour. Dr. Arbie Cookey aware.

## 2018-05-11 ENCOUNTER — Inpatient Hospital Stay (HOSPITAL_COMMUNITY): Payer: Medicare PPO

## 2018-05-11 ENCOUNTER — Inpatient Hospital Stay: Payer: Self-pay

## 2018-05-11 LAB — DIFFERENTIAL
Abs Immature Granulocytes: 0.43 10*3/uL — ABNORMAL HIGH (ref 0.00–0.07)
Basophils Absolute: 0.1 10*3/uL (ref 0.0–0.1)
Basophils Relative: 0 %
Eosinophils Absolute: 0 10*3/uL (ref 0.0–0.5)
Eosinophils Relative: 0 %
IMMATURE GRANULOCYTES: 2 %
Lymphocytes Relative: 5 %
Lymphs Abs: 0.9 10*3/uL (ref 0.7–4.0)
Monocytes Absolute: 2 10*3/uL — ABNORMAL HIGH (ref 0.1–1.0)
Monocytes Relative: 11 %
Neutro Abs: 14.8 10*3/uL — ABNORMAL HIGH (ref 1.7–7.7)
Neutrophils Relative %: 82 %

## 2018-05-11 LAB — CBC
HCT: 36.2 % (ref 36.0–46.0)
Hemoglobin: 12.1 g/dL (ref 12.0–15.0)
MCH: 28.9 pg (ref 26.0–34.0)
MCHC: 33.4 g/dL (ref 30.0–36.0)
MCV: 86.4 fL (ref 80.0–100.0)
Platelets: 234 10*3/uL (ref 150–400)
RBC: 4.19 MIL/uL (ref 3.87–5.11)
RDW: 17.4 % — ABNORMAL HIGH (ref 11.5–15.5)
WBC: 18.2 10*3/uL — ABNORMAL HIGH (ref 4.0–10.5)
nRBC: 0 % (ref 0.0–0.2)

## 2018-05-11 LAB — COMPREHENSIVE METABOLIC PANEL
ALT: 27 U/L (ref 0–44)
AST: 33 U/L (ref 15–41)
Albumin: 2.3 g/dL — ABNORMAL LOW (ref 3.5–5.0)
Alkaline Phosphatase: 84 U/L (ref 38–126)
Anion gap: 8 (ref 5–15)
BILIRUBIN TOTAL: 1.6 mg/dL — AB (ref 0.3–1.2)
BUN: 25 mg/dL — ABNORMAL HIGH (ref 8–23)
CO2: 25 mmol/L (ref 22–32)
Calcium: 9.2 mg/dL (ref 8.9–10.3)
Chloride: 112 mmol/L — ABNORMAL HIGH (ref 98–111)
Creatinine, Ser: 1.11 mg/dL — ABNORMAL HIGH (ref 0.44–1.00)
GFR calc Af Amer: 58 mL/min — ABNORMAL LOW (ref >60)
GFR, EST NON AFRICAN AMERICAN: 50 mL/min — AB (ref >60)
Glucose, Bld: 135 mg/dL — ABNORMAL HIGH (ref 70–99)
Potassium: 3.4 mmol/L — ABNORMAL LOW (ref 3.5–5.1)
Sodium: 145 mmol/L (ref 135–145)
TOTAL PROTEIN: 5.4 g/dL — AB (ref 6.5–8.1)

## 2018-05-11 LAB — PHOSPHORUS: Phosphorus: 1.8 mg/dL — ABNORMAL LOW (ref 2.5–4.6)

## 2018-05-11 LAB — PREALBUMIN: PREALBUMIN: 12 mg/dL — AB (ref 18–38)

## 2018-05-11 LAB — GLUCOSE, CAPILLARY
Glucose-Capillary: 128 mg/dL — ABNORMAL HIGH (ref 70–99)
Glucose-Capillary: 166 mg/dL — ABNORMAL HIGH (ref 70–99)

## 2018-05-11 LAB — TRIGLYCERIDES: Triglycerides: 172 mg/dL — ABNORMAL HIGH (ref <150)

## 2018-05-11 LAB — MAGNESIUM: Magnesium: 2.1 mg/dL (ref 1.7–2.4)

## 2018-05-11 MED ORDER — SODIUM CHLORIDE 0.9% FLUSH
10.0000 mL | INTRAVENOUS | Status: DC | PRN
  Administered 2018-05-12: 10 mL
  Filled 2018-05-11: qty 40

## 2018-05-11 MED ORDER — POTASSIUM CHLORIDE 10 MEQ/100ML IV SOLN
10.0000 meq | INTRAVENOUS | Status: AC
  Administered 2018-05-11 (×4): 10 meq via INTRAVENOUS
  Filled 2018-05-11 (×4): qty 100

## 2018-05-11 MED ORDER — TRAVASOL 10 % IV SOLN
INTRAVENOUS | Status: AC
  Administered 2018-05-11: 18:00:00 via INTRAVENOUS
  Filled 2018-05-11: qty 480

## 2018-05-11 MED ORDER — DIATRIZOATE MEGLUMINE & SODIUM 66-10 % PO SOLN
90.0000 mL | Freq: Once | ORAL | Status: AC
  Administered 2018-05-11: 90 mL via NASOGASTRIC
  Filled 2018-05-11: qty 90

## 2018-05-11 MED ORDER — INSULIN ASPART 100 UNIT/ML ~~LOC~~ SOLN
0.0000 [IU] | SUBCUTANEOUS | Status: DC
  Administered 2018-05-11: 2 [IU] via SUBCUTANEOUS
  Administered 2018-05-11: 3 [IU] via SUBCUTANEOUS
  Administered 2018-05-12 (×2): 2 [IU] via SUBCUTANEOUS
  Administered 2018-05-12 (×2): 3 [IU] via SUBCUTANEOUS
  Administered 2018-05-12 – 2018-05-14 (×11): 2 [IU] via SUBCUTANEOUS
  Administered 2018-05-14: 3 [IU] via SUBCUTANEOUS
  Administered 2018-05-14 – 2018-05-15 (×5): 2 [IU] via SUBCUTANEOUS
  Administered 2018-05-15: 3 [IU] via SUBCUTANEOUS
  Administered 2018-05-15 – 2018-05-19 (×8): 2 [IU] via SUBCUTANEOUS

## 2018-05-11 MED ORDER — HYDRALAZINE HCL 20 MG/ML IJ SOLN
10.0000 mg | Freq: Three times a day (TID) | INTRAMUSCULAR | Status: DC
  Administered 2018-05-11 – 2018-05-12 (×3): 10 mg via INTRAVENOUS
  Filled 2018-05-11 (×3): qty 1

## 2018-05-11 MED ORDER — LABETALOL HCL 5 MG/ML IV SOLN
10.0000 mg | Freq: Three times a day (TID) | INTRAVENOUS | Status: DC
  Administered 2018-05-11 – 2018-05-12 (×3): 10 mg via INTRAVENOUS
  Filled 2018-05-11 (×3): qty 4

## 2018-05-11 MED ORDER — POTASSIUM PHOSPHATES 15 MMOLE/5ML IV SOLN
20.0000 mmol | Freq: Once | INTRAVENOUS | Status: AC
  Administered 2018-05-11: 20 mmol via INTRAVENOUS
  Filled 2018-05-11: qty 6.67

## 2018-05-11 MED ORDER — DEXTROSE-NACL 5-0.9 % IV SOLN
INTRAVENOUS | Status: AC
  Administered 2018-05-11 – 2018-05-13 (×3): via INTRAVENOUS

## 2018-05-11 MED ORDER — FUROSEMIDE 10 MG/ML IJ SOLN
40.0000 mg | Freq: Every day | INTRAMUSCULAR | Status: DC
  Administered 2018-05-11 – 2018-05-12 (×2): 40 mg via INTRAVENOUS
  Filled 2018-05-11 (×2): qty 4

## 2018-05-11 NOTE — Progress Notes (Signed)
Progress Note  Patient Name: Leah Mata Date of Encounter: 05/11/2018  Primary Cardiologist: Kristeen Miss, MD   Subjective   No real improvement in abdomen, needs O2, was not on at home, LE edema is since admission  Inpatient Medications    Scheduled Meds: . chlorhexidine  15 mL Mouth Rinse BID  . docusate sodium  100 mg Oral Daily  . fluticasone  2 spray Each Nare Daily  . heparin  5,000 Units Subcutaneous Q8H  . insulin aspart  0-15 Units Subcutaneous Q4H  . ipratropium-albuterol  3 mL Nebulization TID  . lactulose  30 g Oral BID  . mouth rinse  15 mL Mouth Rinse q12n4p  . metoprolol tartrate  2.5 mg Intravenous Q6H  . pantoprazole (PROTONIX) IV  40 mg Intravenous Q12H  . polyethylene glycol  17 g Oral Daily  . senna-docusate  2 tablet Oral QHS  . sodium chloride flush  10-40 mL Intracatheter Q12H  . sodium phosphate  1 enema Rectal Once   Continuous Infusions: . sodium chloride Stopped (05/05/18 2047)  . amiodarone 30 mg/hr (05/11/18 0927)  . dextrose 5 % and 0.9% NaCl 125 mL/hr at 05/11/18 1327  . dextrose 5 % and 0.9% NaCl    . magnesium sulfate 1 - 4 g bolus IVPB    . potassium chloride 10 mEq (05/11/18 1404)  . potassium PHOSPHATE IVPB (in mmol) 20 mmol (05/11/18 1051)  . TPN ADULT (ION)     PRN Meds: sodium chloride, alum & mag hydroxide-simeth, bisacodyl, guaiFENesin-dextromethorphan, hydrALAZINE, levalbuterol, magnesium sulfate 1 - 4 g bolus IVPB, morphine injection, ondansetron, oxyCODONE-acetaminophen, phenol, sodium chloride flush, sodium chloride flush   Vital Signs    Vitals:   05/11/18 1344 05/11/18 1357 05/11/18 1400 05/11/18 1430  BP:  (!) 180/76  (!) 178/71  Pulse:   67 85  Resp:   (!) 24 (!) 27  Temp:      TempSrc:      SpO2: 97%  96% 93%  Weight:      Height:        Intake/Output Summary (Last 24 hours) at 05/11/2018 1452 Last data filed at 05/11/2018 1300 Gross per 24 hour  Intake 1927.07 ml  Output 4300 ml  Net -2372.93 ml    Filed Weights   04/28/18 1603 05/01/18 0600  Weight: 69.9 kg 83.9 kg    Telemetry   SR, w/ PVCs occ and 1 pair- Personally Reviewed  ECG    No new EKG to review- Personally Reviewed  Physical Exam   General: Well developed, elderly, female in no acute distress Head: Eyes PERRLA, No xanthomas.   Normocephalic and atraumatic Lungs: rales bases bilaterally to auscultation. Heart: HRRR S1 S2, without MRG.  Pulses are 2+ & equal. JVD 9 cm. Abdomen: Bowel sounds are minimal, abdomen distended, firm and tender  Msk: weak strength and tone for age. Extremities: No clubbing, cyanosis, 2+ LE edema.    Skin:  No rashes or lesions noted. Neuro: Alert and oriented X 3. Psych:  Good affect, responds appropriately  Labs    Chemistry Recent Labs  Lab 05/06/18 0220  05/10/18 0320 05/10/18 1551 05/11/18 0653  NA 133*   < > 140 141 145  K 3.8   < > 3.3* 4.2 3.4*  CL 93*   < > 107 106 112*  CO2 25   < > 25 24 25   GLUCOSE 127*   < > 145* 126* 135*  BUN 36*   < > 37*  32* 25*  CREATININE 1.00   < > 1.22* 1.15* 1.11*  CALCIUM 9.4   < > 9.3 9.3 9.2  PROT 6.0*  --   --   --  5.4*  ALBUMIN 2.5*  --   --   --  2.3*  AST 28  --   --   --  33  ALT 25  --   --   --  27  ALKPHOS 79  --   --   --  84  BILITOT 3.5*  --   --   --  1.6*  GFRNONAA 57*   < > 45* 48* 50*  GFRAA >60   < > 52* 55* 58*  ANIONGAP 15   < > 8 11 8    < > = values in this interval not displayed.     Hematology Recent Labs  Lab 05/09/18 0242 05/10/18 0320 05/11/18 0653  WBC 15.3* 15.9* 18.2*  RBC 4.55 4.24 4.19  HGB 13.2 12.5 12.1  HCT 39.5 36.9 36.2  MCV 86.8 87.0 86.4  MCH 29.0 29.5 28.9  MCHC 33.4 33.9 33.4  RDW 16.8* 17.2* 17.4*  PLT 222 215 234    BNP Recent Labs  Lab 05/07/18 0234  BNP 86.8    Lab Results  Component Value Date   CALCIUM 9.2 05/11/2018   PHOS 1.8 (L) 05/11/2018    Radiology    Dg Abd 1 View - Kub  Result Date: 05/10/2018 CLINICAL DATA:  Postoperative ileus EXAM:  ABDOMEN - 1 VIEW COMPARISON:  Yesterday FINDINGS: Nasogastric tube with tip near the pylorus. Unchanged diffuse gaseous distension of bowel, preferentially affecting small bowel. No concerning mass effect or calcification. The stomach is distended by fluid and gas. IMPRESSION: 1. History of ileus with persistent generalized bowel distension. 2. Stomach appears moderately distended by fluid and gas despite nasoenteric tube. Electronically Signed   By: Marnee Spring M.D.   On: 05/10/2018 09:23   Dg Abd Portable 1v-small Bowel Obstruction Protocol-initial, 8 Hr Delay  Result Date: 05/11/2018 CLINICAL DATA:  8 hour follow-up exam for small-bowel dilatation. EXAM: PORTABLE ABDOMEN - 1 VIEW COMPARISON:  05/10/2018 FINDINGS: Gastric catheter is noted within the stomach. Persistent small bowel dilatation is seen. Administered contrast is not well appreciated and likely diluted. Postsurgical changes are again seen. No free air is noted. No other focal abnormality is seen. IMPRESSION: Generalized stable small bowel dilatation consistent with ileus or small-bowel obstruction. Previously administered contrast is not well appreciated on today's exam. Electronically Signed   By: Alcide Clever M.D.   On: 05/11/2018 03:23   [place] Ekg Site Rite  Result Date: 05/11/2018 If Site Rite image not attached, placement could not be confirmed due to current cardiac rhythm.   Cardiac Studies   2D echo 05/03/2018 IMPRESSIONS   1. The left ventricle has hyperdynamic systolic function of >65%. The cavity size is normal. There is no increased left ventricular wall thickness. Echo evidence of impaired diastolic relaxation.  2. The right ventricle has normal systolic function. The cavity in normal in size. There is no increase in right ventricular wall thickness.  3. The mitral valve is normal in structure.  4. The aortic valve has an indeterminant number of cusps.  5. The pulmonic valve is Not well seen. Pulmonic valve  regurgitation was not assessed by color flow Doppler.  6. There is mild dilatation of the aortic root.  7. The interatrial septum was not assessed.  8. Extremely limited due to poor sound  wave transmission; definity used; vigorous LV systolic function; mild diastolic dysfunction.  Patient Profile     73 y.o. female with a hx of HTN, HLD and COPD who is being seen today for the evaluation of atrial fibrillation after repair of ruptured AAA   Assessment & Plan     1.  Paroxysmal atrial fibrillation - happened after AAA repair, pt also w/ PNA - on IV amio, would not change to po yet - continue BB also, no doses held  2.  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  Acute respiratory failure - improved, CCM no longer following  4.  Hypertension  - BP has been generally elevated - on metoprolol 2.5 mg q 6 hr, will increase to 5 mg q 6 h - will order prn hydralazine, may need to do scheduled - also needs more IV Lasix  4. Hypokalemia - she got 4 runs yesterday, K+ increased to 4.2 but dropped overnight - also got KPhos today, Phos was low at 1.8 - discuss additional runs w/ MD as she needs additional Lasix  5.  LE edema - got IV Lasix 40 mg x 1 on 02/12 - will order again today - need to minimize IVF - currently getting IVF D5NS at 125 cc/hr, to end this pm - other IVF is from meds, total was right at 2 L on 02/12 - albumin is low, may be some 3rd spacing but had had large amounts of fluid    For questions or updates, please contact [facility] Please consult [email or web address] for contact info under Cardiology/STEMI.      Signed, Theodore Demarkhonda , PA-C  05/11/2018, 2:52 PM

## 2018-05-11 NOTE — Progress Notes (Signed)
Occupational Therapy Treatment Patient Details Name: Leah Mata Delellis MRN: [id number] DOB: 08/27/1945 Today's Date: 05/11/2018    History of present illness Pt is a 73 y.o. F with significant PMH of COPD who presents with abdominal and right flank pain. 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 S/p aneurysm abdominal aortic repair. Hospitalization complicated by worsening SOB requiring bipap on 2/4 due to pulmonary effusion, tachycardia 2/5 requiring amiodorone drip.   OT comments  Pt continues to require min assist for all mobility. Focus of session on bed mobility, seated grooming and [facility] Pt continues to be appropriate for CIR.  Follow Up Recommendations  CIR    Equipment Recommendations  Tub/shower seat    Recommendations for Other Services      Precautions / Restrictions Precautions Precautions: Fall Precaution Comments: watch HR and 02; NG tube Restrictions Weight Bearing Restrictions: No       Mobility Bed Mobility Overal bed mobility: Needs Assistance Bed Mobility: Rolling;Sidelying to Sit Rolling: Min assist Sidelying to sit: Min assist       General bed mobility comments: cues for technique and to self assist  Transfers Overall transfer level: Needs assistance Equipment used: Rolling walker (2 wheeled) Transfers: Sit to/from [employer]Stand;Stand Pivot Transfers Sit to Stand: Min assist Stand pivot transfers: Min assist       General transfer comment: steadying assist and management of multiple lines    Balance Overall balance assessment: Needs assistance   Sitting balance-[name]: Good       Standing balance-[name]: Poor Standing balance comment: reliant on one hand held                           ADL either performed or assessed with clinical judgement   ADL Overall ADL's : Needs assistance/impaired     Grooming: Brushing hair;Oral care;Sitting;Set up                    Toilet Transfer: Minimal assistance;Stand-pivot;BSC                   Vision       Perception     Praxis      Cognition Arousal/Alertness: Awake/alert Behavior During Therapy: WFL for tasks assessed/performed;Anxious Overall Cognitive Status: Within Functional Limits for tasks assessed                                          Exercises     Shoulder Instructions       General Comments      Pertinent Vitals/ Pain       Pain Assessment: No/denies pain  Home Living                                          Prior Functioning/Environment              Frequency  Min 2X/week        Progress Toward Goals  OT Goals(current goals can now be found in the care plan section)  Progress towards OT goals: Progressing toward goals  Acute Rehab OT Goals Patient Stated Goal: to get home OT Goal Formulation: With patient Time For Goal Achievement: 05/14/18  Potential to Achieve Goals: Good  Plan Discharge plan remains appropriate    Co-evaluation                 AM-PAC OT "6 Clicks" Daily Activity     Outcome Measure   Help from another person eating meals?: None Help from another person taking care of personal grooming?: A Little Help from another person toileting, which includes using toliet, bedpan, or urinal?: A Lot Help from another person bathing (including washing, rinsing, drying)?: A Lot Help from another person to put on and taking off regular upper body clothing?: A Little Help from another person to put on and taking off regular lower body clothing?: A Lot 6 Click Score: 16    End of Session Equipment Utilized During Treatment: Gait belt;Rolling walker;Oxygen  OT Visit Diagnosis: Unsteadiness on feet (R26.81);Other abnormalities of gait and mobility (R26.89);Muscle weakness (generalized) (M62.81);Pain   Activity Tolerance Patient tolerated treatment well   Patient Left with call  bell/phone within reach;with family/visitor present(on commode)   Nurse Communication Mobility status;Other (comment)(IV alarming)        Time: 1135-1150 OT Time Calculation (min): 15 min  Charges: OT General Charges $OT Visit: 1 Visit OT Treatments $Self Care/Home Management : 8-22 mins  Martie RoundJulie Giuliano Preece, OTR/L Acute Rehabilitation Services Pager: [phone] Office: [phone]   Evern BioMayberry, Ayyub Krall Lynn 05/11/2018, 12:03 PM

## 2018-05-11 NOTE — Progress Notes (Signed)
Initial Nutrition Assessment  DOCUMENTATION CODES:   Obesity unspecified  INTERVENTION:    TPN per pharmacy  NUTRITION DIAGNOSIS:   Inadequate oral intake related to altered GI function as evidenced by NPO status  GOAL:   Patient will meet greater than or equal to 90% of their needs  MONITOR:   Diet advancement, Labs, Skin, Weight trends, I & O's, TPN  REASON FOR ASSESSMENT:   Consult New TPN/TNA  ASSESSMENT:   73 yo Female who presented to [facility] with abdominal and right flank pain.  CT scan showed large greater than 8 cm infrarenal aneurysm with contained rupture in the right retroperitoneal space.  She was directed to come immediately to [facility] to the operating room for surgery.   2/1 S/p AAA repair 2/4 Clear liquids 2/7 Full liquids 2/7 Dys 2-thin liquids 2/9 CT abd: distended SB loops with transition point in the proximal ileum  Chart reviewed; pt with slow progression post-op. Developed abdominal distention, nausea and emesis 2/8. Received laxatives and fleet edema with no response.  CCS consulted yesterday for SBO vs ileus.  Pt now NPO; NGT to LIS with high output. TPN to start tonight at 40 ml/hr via PICC.   TPN provides 1036 kcals and 48 gm protein. Medications include Colace, Protonix & Lasix. Labs reviewed. K 3.4 (L). P 1.8 (L).  NUTRITION - FOCUSED PHYSICAL EXAM:    Most Recent Value  Orbital Region  Unable to assess  Upper Arm Region  Unable to assess  Thoracic and Lumbar Region  Unable to assess  Buccal Region  Unable to assess  Temple Region  Unable to assess  Clavicle Bone Region  Unable to assess  Clavicle and Acromion Bone Region  Unable to assess  Scapular Bone Region  Unable to assess  Dorsal Hand  Unable to assess  Patellar Region  Unable to assess  Anterior Thigh Region  Unable to assess  Posterior Calf Region  Unable to assess  Edema (RD Assessment)  Unable to assess     Diet Order:   Diet Order            Diet NPO time specified   Diet effective now             EDUCATION NEEDS:   Not appropriate for education at this time  Skin:  Skin Assessment: Reviewed RN Assessment  Last BM:  2/11  Height:   Ht Readings from Last 1 Encounters:  04/29/18 5\' 4"  (1.626 m)   Weight:   Wt Readings from Last 1 Encounters:  05/11/18 88.3 kg   BMI:  Body mass index is 33.41 kg/m.  Estimated Nutritional Needs:   Kcal:  1800-2000  Protein:  90-105 gm  Fluid:  1.8-2.0 L  Maureen Chatters, RD, LDN Pager #: [phone] After-Hours Pager #: [phone]

## 2018-05-11 NOTE — Progress Notes (Addendum)
Gastrografin administered via NG tube at this time. Suction off. Will connect back to suction in 2 hours. Xray notified.

## 2018-05-11 NOTE — Progress Notes (Signed)
Patient up to [facility]. Pt. Voided 300 ml. Patient then transferred to chair to sit for awhile.

## 2018-05-11 NOTE — Progress Notes (Signed)
MD paged at this time. Patient's BP 170's 180's SBP after PRN hydralazine.

## 2018-05-11 NOTE — Progress Notes (Signed)
[facility] Progress Note  13 Days Post-Op  Subjective: CC-  Patient states that she feels a little better. Continues to have some abdominal pain and bloating, but thinks it may be a little less today. Thinks she passed some flatus. NG tube with 4.5L out over last 24 hours. She did get OOB to ambulate yesterday. Xray this AM shows persistent generalized small bowel dilatation, I do not see the contrast we administered.  IV team in room to place PICC.  Objective: Vital signs in last 24 hours: Temp:  [97.4 F (36.3 C)-98.3 F (36.8 C)] 97.8 F (36.6 C) (02/13 0821) Pulse Rate:  [71-91] 73 (02/13 0821) Resp:  [18-24] 18 (02/13 0821) BP: (139-177)/(65-78) 167/73 (02/13 0821) SpO2:  [94 %-99 %] 97 % (02/13 0821) Last BM Date: 05/09/18  Intake/Output from previous day: 02/12 0701 - 02/13 0700 In: 1927.1 [I.V.:1549.9; IV Piggyback:377.2] Out: 4500 [Emesis/NG output:4500] Intake/Output this shift: No intake/output data recorded.  PE: Gen:  Alert, NAD, pleasant HEENT: EOM's intact, pupils equal and round Card:  RRR, no M/G/R heard Pulm:  Few rhonchi, no wheezing, effort normal Abd: midline incision cdi with staples intact and no erythema or drainage, soft, distended, mild diffuse tenderness, no rebound or guarding, few BS heard, no masses, hernias, or organomegaly Psych: A&Ox3  Skin: no rashes noted, warm and dry  Lab Results:  Recent Labs    05/10/18 0320 05/11/18 0653  WBC 15.9* 18.2*  HGB 12.5 12.1  HCT 36.9 36.2  PLT 215 234   BMET Recent Labs    05/10/18 1551 05/11/18 0653  NA 141 145  K 4.2 3.4*  CL 106 112*  CO2 24 25  GLUCOSE 126* 135*  BUN 32* 25*  CREATININE 1.15* 1.11*  CALCIUM 9.3 9.2   PT/INR No results for input(s): LABPROT, INR in the last 72 hours. CMP     Component Value Date/Time   NA 145 05/11/2018 0653   K 3.4 (L) 05/11/2018 0653   CL 112 (H) 05/11/2018 0653   CO2 25 05/11/2018 0653   GLUCOSE 135 (H) 05/11/2018 0653   BUN 25 (H) 05/11/2018 0653   CREATININE 1.11 (H) 05/11/2018 0653   CALCIUM 9.2 05/11/2018 0653   PROT 5.4 (L) 05/11/2018 0653   ALBUMIN 2.3 (L) 05/11/2018 0653   AST 33 05/11/2018 0653   ALT 27 05/11/2018 0653   ALKPHOS 84 05/11/2018 0653   BILITOT 1.6 (H) 05/11/2018 0653   GFRNONAA 50 (L) 05/11/2018 0653   GFRAA 58 (L) 05/11/2018 0653   Lipase  No results found for: LIPASE     Studies/Results: Dg Abd 1 View - Kub  Result Date: 05/10/2018 CLINICAL DATA:  Postoperative ileus EXAM: ABDOMEN - 1 VIEW COMPARISON:  Yesterday FINDINGS: Nasogastric tube with tip near the pylorus. Unchanged diffuse gaseous distension of bowel, preferentially affecting small bowel. No concerning mass effect or calcification. The stomach is distended by fluid and gas. IMPRESSION: 1. History of ileus with persistent generalized bowel distension. 2. Stomach appears moderately distended by fluid and gas despite nasoenteric tube. Electronically Signed   By: Marnee Spring M.D.   On: 05/10/2018 09:23   Dg Abd 1 View  Result Date: 05/09/2018 CLINICAL DATA:  73 year old female with orogastric tube placement. Subsequent encounter. EXAM: ABDOMEN - 1 VIEW COMPARISON:  05/09/2018. FINDINGS: Nasogastric tube placed with tip at the expected gastric pylorus/proximal duodenum level and side hole gastric antrum level. Gas-filled stomach. Gas distended small bowel loops as noted on prior exam. No free air  detected. Hazy opacification lung bases greater on the right.  Cardiomegaly. IMPRESSION: 1. Nasogastric tube placed with tip at the gastric pylorus/proximal duodenum level and side hole gastric antrum level. Gas-filled stomach. 2. Gas distended small bowel loops as noted on prior exam. Electronically Signed   By: Lacy Duverney M.D.   On: 05/09/2018 13:08   Dg Abd Portable 1v-small Bowel Obstruction Protocol-initial, 8 Hr Delay  Result Date: 05/11/2018 CLINICAL DATA:  8 hour follow-up exam for small-bowel dilatation. EXAM:  PORTABLE ABDOMEN - 1 VIEW COMPARISON:  05/10/2018 FINDINGS: Gastric catheter is noted within the stomach. Persistent small bowel dilatation is seen. Administered contrast is not well appreciated and likely diluted. Postsurgical changes are again seen. No free air is noted. No other focal abnormality is seen. IMPRESSION: Generalized stable small bowel dilatation consistent with ileus or small-bowel obstruction. Previously administered contrast is not well appreciated on today's exam. Electronically Signed   By: Alcide Clever M.D.   On: 05/11/2018 03:23   [place] Ekg Site Rite  Result Date: 05/11/2018 If Site Rite image not attached, placement could not be confirmed due to current cardiac rhythm.   Anti-infectives: Anti-infectives (From admission, onward)   Start     Dose/Rate Route Frequency Ordered Stop   05/03/18 1815  cefTRIAXone (ROCEPHIN) 1 g in sodium chloride 0.9 % 100 mL IVPB  Status:  Discontinued     1 g 200 mL/hr over 30 Minutes Intravenous Every 24 hours 05/03/18 1728 05/09/18 1148   04/29/18 0400  ceFAZolin (ANCEF) IVPB 2g/100 mL premix     2 g 200 mL/hr over 30 Minutes Intravenous Every 8 hours 04/29/18 0044 04/29/18 1259       Assessment/Plan Current smoker COPD HTN HLD Ruptured infrarenal abdominal aortic aneurysm s/p open repair 04/28/18 Dr. Tawanna Cooler Early Paroxysmal atrial fibrillation  SBO vs Ileus - Patient with SBO vs ileus. Continues to have dilated bowel on xray, but I do not see the gastrograffin given yesterday on her film this AM. Will repeat small bowel protocol, keep NG tube clamped x2 hours after giving contrast. Continue mobilizing.  Hopefully this will resolve but she continues to have high NG tube output and does not seem to be making much progress.  ID - none currently VTE - sq heparin FEN - IVF, NPO/NGT, TPN Foley - none Follow up - TBD   LOS: 13 days    Franne Forts , [facility] 05/11/2018, 8:57 AM Pager: [phone] Mon-Thurs  7:00 am-4:30 pm Fri 7:00 am -11:30 AM Sat-Sun 7:00 am-11:30 am

## 2018-05-11 NOTE — Care Management Important Message (Signed)
Important Message  Patient Details  Name: Leah JewelLinda D Kilcrease MRN: [id number] Date of Birth: 10/14/1945   Medicare Important Message Given:  Yes    [name] P Kayse Puccini 05/11/2018, 1:09 PM

## 2018-05-11 NOTE — Progress Notes (Signed)
SLP Cancellation Note  Patient Details Name: Leah Mata MRN: [id number] DOB: 20-Aug-1945   Cancelled treatment:       Reason Eval/Treat Not Completed: Medical issues which prohibited therapy (ileus with NGT to suction, now on TPN).    Leah Mata 05/11/2018, 8:06 AM  Leah Mata, M.A. CCC-SLP Acute Herbalist [phone] Office [phone]

## 2018-05-11 NOTE — Progress Notes (Signed)
Patient to [facility] and got SOB. Patient's O2 at 92%. O2 up to 5 liters and patient back to bed. O2 now 97%. Called respiratory to request breathing treatment. Respiratory said they would be here shortly. Patient states that she is comfortable at this time.   Ernestina Columbia, RN

## 2018-05-11 NOTE — Progress Notes (Signed)
   05/11/18 1930  Vitals  Temp 98.7 F (37.1 C)  Temp Source Oral  BP (!) 183/74  MAP (mmHg) 105  BP Location Right Leg  BP Method Automatic  Patient Position (if appropriate) Lying  Pulse Rate 89  Pulse Rate Source Monitor  ECG Heart Rate 90  Cardiac Rhythm NSR  Resp (!) 24  prn hydralazine given will continue to monitor

## 2018-05-11 NOTE — Progress Notes (Signed)
Peripherally Inserted Central Catheter/Midline Placement  The IV Nurse has discussed with the patient and/or persons authorized to consent for the patient, the purpose of this procedure and the potential benefits and risks involved with this procedure.  The benefits include less needle sticks, lab draws from the catheter, and the patient may be discharged home with the catheter. Risks include, but not limited to, infection, bleeding, blood clot (thrombus formation), and puncture of an artery; nerve damage and irregular heartbeat and possibility to perform a PICC exchange if needed/ordered by physician.  Alternatives to this procedure were also discussed.  Bard Power PICC patient education guide, fact sheet on infection prevention and patient information card has been provided to patient /or left at bedside.    PICC/Midline Placement Documentation  PICC Double Lumen 05/11/18 PICC Right Basilic 37 cm 0 cm (Active)  Indication for Insertion or Continuance of Line Administration of hyperosmolar/irritating solutions (i.e. TPN, Vancomycin, etc.) 05/11/2018  9:40 AM  Exposed Catheter (cm) 0 cm 05/11/2018  9:40 AM  Site Assessment Clean;Intact;Dry 05/11/2018  9:40 AM  Lumen #1 Status Flushed;Blood return noted;Saline locked 05/11/2018  9:40 AM  Lumen #2 Status Flushed;Blood return noted;Saline locked 05/11/2018  9:40 AM  Dressing Type Transparent 05/11/2018  9:40 AM  Dressing Status Clean;Dry;Intact 05/11/2018  9:40 AM  Dressing Change Due 05/18/18 05/11/2018  9:40 AM       Audrie Gallus 05/11/2018, 9:51 AM

## 2018-05-11 NOTE — Progress Notes (Addendum)
PHARMACY - ADULT TOTAL PARENTERAL NUTRITION CONSULT NOTE   Pharmacy Consult for TPN  Indication: Prolonged ileus  Patient Measurements: Height: 5\' 4"  (162.6 cm) Weight: 184 lb 15.5 oz (83.9 kg) IBW/kg (Calculated) : 54.7 TPN AdjBW (KG): 69.9 Body mass index is 31.75 kg/m. Usual Weight:   Assessment: Ruptured AAA repair.  PMH: HTN, HLD, COPD  GI: Albumin 2.5 (2/8). NGT in place. 4500 out.  +stool 2/11. Pt notes flatus. IV PPI/12hrs, docusate, lactulose, Miralax, SennaS,   Endo: no h/o DM noted. Insulin requirements in the past 24 hours:   Lytes:K 3.3>4.2>3.4. Phos 1.8 low,   Renal: Scr 1.15 declining.  Pulm: Flonase, duonebs,  Cards: Post-op afib. IV metoprolol, IV Amio  Hepatobil: TG 244 (2/11), Tbili elevated 1.6.  Neuro:  ID: WBC 15.9  TPN Access: PICC 2/13 TPN start date: 05/11/18 Nutritional Goals (per RD recommendation on  ): kCal: Protein:  Fluid:  Goal TPN rate is   ml/hr (provides  )  Current Nutrition:   Plan:  Spoke with Dr. Arbie Cookey this AM about needing PICC order.  Start TPN at 31ml/hr - This TPN will provide 48g protein, 192g lipids, 192g dextrose, and 1036 kcal  D5NS at 183ml/hr>>decrease to 85 ml/hr with TPN starts  Start SSI q 6 hrs   RD consult for goals.  Kphos IV x 1  K runs x 4 today  F/u new triglycerides and prealbumin pending this AM   Jakaleb Payer S. Merilynn Finland, PharmD, BCPS Clinical Staff Pharmacist Misty Stanley Stillinger 05/11/2018,7:50 AM

## 2018-05-11 NOTE — Progress Notes (Signed)
  AAA Progress Note    05/11/2018 7:41 AM 13 Days Post-Op  Subjective:  Says she feels a little bit better and passed some gas  Afebrile HR 70's-80's afib 130's-170's systolic 98% 2LO2NC  Vitals:   05/11/18 0456 05/11/18 0720  BP: (!) 153/65   Pulse: 71   Resp: 20   Temp: (!) 97.4 F (36.3 C)   SpO2: 97% 98%    Physical Exam: Cardiac:  regular Lungs:  Non labored Abdomen:  Soft; still distended; decreased BS Incisions:  Clean and dry with staples in tact Extremities:  Bilateral feet are warm and well perfused.  CBC    Component Value Date/Time   WBC 15.9 (H) 05/10/2018 0320   RBC 4.24 05/10/2018 0320   HGB 12.5 05/10/2018 0320   HCT 36.9 05/10/2018 0320   PLT 215 05/10/2018 0320   MCV 87.0 05/10/2018 0320   MCH 29.5 05/10/2018 0320   MCHC 33.9 05/10/2018 0320   RDW 17.2 (H) 05/10/2018 0320   LYMPHSABS 0.9 05/09/2018 0242   MONOABS 2.1 (H) 05/09/2018 0242   EOSABS 0.1 05/09/2018 0242   BASOSABS 0.1 05/09/2018 0242    BMET    Component Value Date/Time   NA 141 05/10/2018 1551   K 4.2 05/10/2018 1551   CL 106 05/10/2018 1551   CO2 24 05/10/2018 1551   GLUCOSE 126 (H) 05/10/2018 1551   BUN 32 (H) 05/10/2018 1551   CREATININE 1.15 (H) 05/10/2018 1551   CALCIUM 9.3 05/10/2018 1551   GFRNONAA 48 (L) 05/10/2018 1551   GFRAA 55 (L) 05/10/2018 1551    INR    Component Value Date/Time   INR 1.16 04/30/2018 0413     Intake/Output Summary (Last 24 hours) at 05/11/2018 0741 Last data filed at 05/11/2018 0456 Gross per 24 hour  Intake 1927.07 ml  Output 4500 ml  Net -2572.93 ml     Assessment/Plan:  73 y.o. female is s/p  Open repair of ruptured infrarenal abdominal aortic aneurysm 13 Days Post-Op  GI:  Increased NGT output with 4500cc/24hr.  Abdomen continues to be soft and distended but non tender.  Decreased bowel sounds.  Reports flatus.  TPN started yesterday.  Appreciate general surgery  Cardiac:  afib overnight but in sinus rhythm this am;  management per cardiology Renal:  Labs pending.   Lungs:  Down to 2LO2NC.  Continue to work on IS and mobilize Heme:  Remains afebrile  Disposition:  Possibly CIR; continue PT and mobilization    Doreatha Massed, PA-C [facility] [phone] 05/11/2018 7:41 AM

## 2018-05-12 ENCOUNTER — Inpatient Hospital Stay (HOSPITAL_COMMUNITY): Payer: Medicare PPO

## 2018-05-12 LAB — GLUCOSE, CAPILLARY
Glucose-Capillary: 121 mg/dL — ABNORMAL HIGH (ref 70–99)
Glucose-Capillary: 126 mg/dL — ABNORMAL HIGH (ref 70–99)
Glucose-Capillary: 138 mg/dL — ABNORMAL HIGH (ref 70–99)
Glucose-Capillary: 150 mg/dL — ABNORMAL HIGH (ref 70–99)
Glucose-Capillary: 154 mg/dL — ABNORMAL HIGH (ref 70–99)
Glucose-Capillary: 160 mg/dL — ABNORMAL HIGH (ref 70–99)

## 2018-05-12 LAB — BASIC METABOLIC PANEL
Anion gap: 8 (ref 5–15)
BUN: 28 mg/dL — ABNORMAL HIGH (ref 8–23)
CALCIUM: 9.1 mg/dL (ref 8.9–10.3)
CO2: 25 mmol/L (ref 22–32)
Chloride: 111 mmol/L (ref 98–111)
Creatinine, Ser: 1.32 mg/dL — ABNORMAL HIGH (ref 0.44–1.00)
GFR calc Af Amer: 47 mL/min — ABNORMAL LOW (ref >60)
GFR calc non Af Amer: 40 mL/min — ABNORMAL LOW (ref >60)
Glucose, Bld: 156 mg/dL — ABNORMAL HIGH (ref 70–99)
Potassium: 3.5 mmol/L (ref 3.5–5.1)
Sodium: 144 mmol/L (ref 135–145)

## 2018-05-12 LAB — URINALYSIS, ROUTINE W REFLEX MICROSCOPIC
Bilirubin Urine: NEGATIVE
Glucose, UA: NEGATIVE mg/dL
Hgb urine dipstick: NEGATIVE
Ketones, ur: NEGATIVE mg/dL
Leukocytes,Ua: NEGATIVE
Nitrite: NEGATIVE
Protein, ur: NEGATIVE mg/dL
Specific Gravity, Urine: 1.012 (ref 1.005–1.030)
pH: 5 (ref 5.0–8.0)

## 2018-05-12 LAB — MAGNESIUM: Magnesium: 1.9 mg/dL (ref 1.7–2.4)

## 2018-05-12 LAB — PHOSPHORUS: Phosphorus: 2.6 mg/dL (ref 2.5–4.6)

## 2018-05-12 MED ORDER — LABETALOL HCL 5 MG/ML IV SOLN
10.0000 mg | Freq: Four times a day (QID) | INTRAVENOUS | Status: DC
  Administered 2018-05-12 – 2018-05-18 (×24): 10 mg via INTRAVENOUS
  Filled 2018-05-12 (×24): qty 4

## 2018-05-12 MED ORDER — SENNOSIDES 8.8 MG/5ML PO SYRP
10.0000 mL | ORAL_SOLUTION | Freq: Every day | ORAL | Status: DC
  Administered 2018-05-20 – 2018-05-28 (×5): 10 mL
  Filled 2018-05-12 (×18): qty 10

## 2018-05-12 MED ORDER — DOCUSATE SODIUM 50 MG/5ML PO LIQD
100.0000 mg | Freq: Every day | ORAL | Status: DC
  Filled 2018-05-12 (×2): qty 10

## 2018-05-12 MED ORDER — FUROSEMIDE 10 MG/ML IJ SOLN
40.0000 mg | Freq: Two times a day (BID) | INTRAMUSCULAR | Status: DC
  Administered 2018-05-12 – 2018-05-13 (×2): 40 mg via INTRAVENOUS
  Filled 2018-05-12 (×2): qty 4

## 2018-05-12 MED ORDER — TRAVASOL 10 % IV SOLN
INTRAVENOUS | Status: AC
  Administered 2018-05-12: 18:00:00 via INTRAVENOUS
  Filled 2018-05-12: qty 720

## 2018-05-12 MED ORDER — HYDRALAZINE HCL 20 MG/ML IJ SOLN
10.0000 mg | Freq: Four times a day (QID) | INTRAMUSCULAR | Status: DC
  Administered 2018-05-12 – 2018-05-13 (×4): 10 mg via INTRAVENOUS
  Filled 2018-05-12 (×4): qty 1

## 2018-05-12 MED ORDER — DOCUSATE SODIUM 50 MG/5ML PO LIQD
100.0000 mg | Freq: Every day | ORAL | Status: DC
  Administered 2018-05-16 – 2018-05-28 (×7): 100 mg
  Filled 2018-05-12 (×10): qty 10

## 2018-05-12 MED ORDER — POTASSIUM CHLORIDE 10 MEQ/50ML IV SOLN
10.0000 meq | INTRAVENOUS | Status: AC
  Administered 2018-05-12 (×3): 10 meq via INTRAVENOUS
  Filled 2018-05-12 (×3): qty 50

## 2018-05-12 NOTE — Progress Notes (Signed)
Physical Therapy Treatment Patient Details Name: Leah Mata MRN: [id number] DOB: 08-08-1945 Today's Date: 05/12/2018    History of Present Illness Pt is a 73 y.o. F with significant PMH of COPD who presents with abdominal and right flank pain. 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 S/p aneurysm abdominal aortic repair. Hospitalization complicated by worsening SOB requiring bipap on 2/4 due to pulmonary effusion, tachycardia 2/5 requiring amiodorone drip.    PT Comments    Pt making steady progress towards her physical therapy goals. Still requiring minimal assistance for all functional mobility but increased ambulation distance to 60 feet x 2 with walker and close chair follow. SpO2 > 90% on 4L O2. Continues with decreased endurance and functional strength deficits and will benefit from CIR.     Follow Up Recommendations  CIR     Equipment Recommendations  Rolling walker with 5" wheels;3in1 (PT)    Recommendations for Other Services       Precautions / Restrictions Precautions Precautions: Fall Precaution Comments: NG tube Restrictions Weight Bearing Restrictions: No    Mobility  Bed Mobility               General bed mobility comments: OOB in chair  Transfers Overall transfer level: Needs assistance Equipment used: Rolling walker (2 wheeled) Transfers: Sit to/from Stand Sit to Stand: Min assist         General transfer comment: Min assist to power up. cues for scooting hips out to edge of bed  Ambulation/Gait Ambulation/Gait assistance: +2 safety/equipment;Min assist(2nd person chair follow) Gait Distance (Feet): 60 Feet(x2) Assistive device: Rolling walker (2 wheeled) Gait Pattern/deviations: Step-through pattern;Decreased stride length;Wide base of support Gait velocity: decreased Gait velocity interpretation: <1.8 ft/sec, indicate of risk for recurrent falls General Gait Details:  Cues for walker proximity and pt able to maintain with upright posture. Chair follow utilized   Social research officer, government Rankin (Stroke Patients Only)       Balance Overall balance assessment: Needs assistance Sitting-balance support: Feet supported Sitting balance-[name]: Good     Standing balance support: Bilateral upper extremity supported;During functional activity Standing balance-[name]: Poor                              Cognition Arousal/Alertness: Awake/alert Behavior During Therapy: WFL for tasks assessed/performed Overall Cognitive Status: Within Functional Limits for tasks assessed                                        Exercises General Exercises - Lower Extremity Long Arc Quad: 20 reps;Both;Seated Hip ABduction/ADduction: 15 reps;Seated Hip Flexion/Marching: 20 reps;Both;Seated    General Comments        Pertinent Vitals/Pain Pain Assessment: No/denies pain    Home Living                      Prior Function            PT Goals (current goals can now be found in the care plan section) Acute Rehab PT Goals Patient Stated Goal: to get home PT Goal Formulation: With patient Time For Goal Achievement: 05/26/18 Potential to Achieve Goals: Good Progress towards PT goals: Progressing toward goals  Frequency    Min 3X/week      PT Plan Current plan remains appropriate    Co-evaluation              AM-PAC PT "6 Clicks" Mobility   Outcome Measure  Help needed turning from your back to your side while in a flat bed without using bedrails?: A Little Help needed moving from lying on your back to sitting on the side of a flat bed without using bedrails?: A Little Help needed moving to and from a bed to a chair (including a wheelchair)?: A Little Help needed standing up from a chair using your arms (e.g., wheelchair or bedside chair)?: A Little Help needed to  walk in hospital room?: A Lot Help needed climbing 3-5 steps with a railing? : Total 6 Click Score: 15    End of Session Equipment Utilized During Treatment: Oxygen Activity Tolerance: Patient tolerated treatment well Patient left: with call bell/phone within reach;with family/visitor present;in chair Nurse Communication: Mobility status PT Visit Diagnosis: Unsteadiness on feet (R26.81);Muscle weakness (generalized) (M62.81)     Time: 7530-0511 PT Time Calculation (min) (ACUTE ONLY): 36 min  Charges:  $Gait Training: 8-22 mins $Therapeutic Exercise: 8-22 mins                    Laurina Bustle, PT, DPT Acute Rehabilitation Services Pager [phone] Office [phone]   Vanetta Mulders 05/12/2018, 4:22 PM

## 2018-05-12 NOTE — Progress Notes (Signed)
[facility] Progress Note  14 Days Post-Op  Subjective: CC-  Up in chair this morning. States that she's passing more gas this morning than yesterday. No BM. Still feels bloated. 2L out from NG tube last 24 hours. Abdominal xray last night with persistent small bowel dilatation.  Objective: Vital signs in last 24 hours: Temp:  [97.5 F (36.4 C)-98.8 F (37.1 C)] 98.8 F (37.1 C) (02/14 0354) Pulse Rate:  [64-90] 76 (02/14 0742) Resp:  [16-29] 16 (02/14 0742) BP: (119-208)/(57-81) 158/65 (02/14 0354) SpO2:  [93 %-99 %] 94 % (02/14 0742) Weight:  [87.1 kg-88.3 kg] 87.1 kg (02/14 0354) Last BM Date: 05/09/18  Intake/Output from previous day: 02/13 0701 - 02/14 0700 In: 2080.3 [I.V.:2050.3; NG/GT:30] Out: 2976 [Urine:976; Emesis/NG output:2000] Intake/Output this shift: No intake/output data recorded.  PE: Gen:  Alert, NAD, pleasant HEENT: EOM's intact, pupils equal and round Card:  RRR Pulm:  Few rhonchi, no wheezing, effort normal Abd: midlineincisioncdi with staples intact and no erythema or drainage, soft,distended, mild diffuse tenderness, no rebound or guarding,fewBSheard (increased from yesterday), no masses, hernias, or organomegaly Psych: A&Ox3  Skin: no rashes noted, warm and dry   Lab Results:  Recent Labs    05/10/18 0320 05/11/18 0653  WBC 15.9* 18.2*  HGB 12.5 12.1  HCT 36.9 36.2  PLT 215 234   BMET Recent Labs    05/11/18 0653 05/12/18 0517  NA 145 144  K 3.4* 3.5  CL 112* 111  CO2 25 25  GLUCOSE 135* 156*  BUN 25* 28*  CREATININE 1.11* 1.32*  CALCIUM 9.2 9.1   PT/INR No results for input(s): LABPROT, INR in the last 72 hours. CMP     Component Value Date/Time   NA 144 05/12/2018 0517   K 3.5 05/12/2018 0517   CL 111 05/12/2018 0517   CO2 25 05/12/2018 0517   GLUCOSE 156 (H) 05/12/2018 0517   BUN 28 (H) 05/12/2018 0517   CREATININE 1.32 (H) 05/12/2018 0517   CALCIUM 9.1 05/12/2018 0517   PROT 5.4 (L) 05/11/2018  0653   ALBUMIN 2.3 (L) 05/11/2018 0653   AST 33 05/11/2018 0653   ALT 27 05/11/2018 0653   ALKPHOS 84 05/11/2018 0653   BILITOT 1.6 (H) 05/11/2018 0653   GFRNONAA 40 (L) 05/12/2018 0517   GFRAA 47 (L) 05/12/2018 0517   Lipase  No results found for: LIPASE     Studies/Results: Dg Abd Portable 1v-small Bowel Obstruction Protocol-initial, 8 Hr Delay  Result Date: 05/11/2018 CLINICAL DATA:  Small bowel obstruction EXAM: PORTABLE ABDOMEN - 1 VIEW COMPARISON:  05/11/2018 FINDINGS: NG tube tip is in the distal stomach. Dilated small bowel loops again noted, not significantly changed since prior study. Gas noted within the colon. Surgical skin staples noted along the midline. IMPRESSION: Continue small bowel dilatation, not significantly changed. Electronically Signed   By: Charlett Nose M.D.   On: 05/11/2018 22:59   Dg Abd Portable 1v-small Bowel Obstruction Protocol-initial, 8 Hr Delay  Result Date: 05/11/2018 CLINICAL DATA:  8 hour follow-up exam for small-bowel dilatation. EXAM: PORTABLE ABDOMEN - 1 VIEW COMPARISON:  05/10/2018 FINDINGS: Gastric catheter is noted within the stomach. Persistent small bowel dilatation is seen. Administered contrast is not well appreciated and likely diluted. Postsurgical changes are again seen. No free air is noted. No other focal abnormality is seen. IMPRESSION: Generalized stable small bowel dilatation consistent with ileus or small-bowel obstruction. Previously administered contrast is not well appreciated on today's exam. Electronically Signed   By:  Alcide Clever M.D.   On: 05/11/2018 03:23   [place] Ekg Site Rite  Result Date: 05/11/2018 If Site Rite image not attached, placement could not be confirmed due to current cardiac rhythm.   Anti-infectives: Anti-infectives (From admission, onward)   Start     Dose/Rate Route Frequency Ordered Stop   05/03/18 1815  cefTRIAXone (ROCEPHIN) 1 g in sodium chloride 0.9 % 100 mL IVPB  Status:  Discontinued     1 g 200  mL/hr over 30 Minutes Intravenous Every 24 hours 05/03/18 1728 05/09/18 1148   04/29/18 0400  ceFAZolin (ANCEF) IVPB 2g/100 mL premix     2 g 200 mL/hr over 30 Minutes Intravenous Every 8 hours 04/29/18 0044 04/29/18 1259       Assessment/Plan Current smoker COPD HTN HLD Ruptured infrarenal abdominal aortic aneurysms/p open repair 04/28/18 Dr. Tawanna Cooler Early Paroxysmal atrial fibrillation  SBO vs Ileus - Passing more flatus today but she is still distended and has high NG tube output. Continue conservative management with bowel rest. Reiterated importance of getting OOB/mobilization. Continue TPN. Monitor electrolytes.  ID -none currently VTE -sq heparin FEN -IVF, NPO/NGT, TPN Foley -none Follow up -TBD   LOS: 14 days    Franne Forts , [facility] 05/12/2018, 8:41 AM Pager: [phone] Mon-Thurs 7:00 am-4:30 pm Fri 7:00 am -11:30 AM Sat-Sun 7:00 am-11:30 am

## 2018-05-12 NOTE — Progress Notes (Signed)
SLP Cancellation Note  Patient Details Name: Leah Mata MRN: [id number] DOB: 06-07-1945   Cancelled treatment:       Reason Eval/Treat Not Completed: Medical issues which prohibited therapy - remains NPO with NGT to suction. Will f/u early next week as able.   Virl Axe [name] 05/12/2018, 9:12 AM  Maxcine Ham [name], M.A. CCC-SLP Acute Herbalist [phone] Office [phone]

## 2018-05-12 NOTE — Progress Notes (Addendum)
  AAA Progress Note    05/12/2018 7:15 AM 14 Days Post-Op  Subjective:  Up in chair; says she's passing gas but no bowel movement.  Afebrile HR 60's-90's NSR 110's-180's systolic 96% 4LO2NC  Vitals:   05/12/18 0126 05/12/18 0354  BP:  (!) 158/65  Pulse: 75 72  Resp: (!) 22 (!) 21  Temp:  98.8 F (37.1 C)  SpO2: 95% 96%    Physical Exam: Cardiac:  Regular Lungs:  Non labored Abdomen:  Soft but distended; occasional bowel sounds Incisions:  Clean and dry with staples in tact. Extremities:  Bilateral feet are warm and well perfused.    CBC    Component Value Date/Time   WBC 18.2 (H) 05/11/2018 0653   RBC 4.19 05/11/2018 0653   HGB 12.1 05/11/2018 0653   HCT 36.2 05/11/2018 0653   PLT 234 05/11/2018 0653   MCV 86.4 05/11/2018 0653   MCH 28.9 05/11/2018 0653   MCHC 33.4 05/11/2018 0653   RDW 17.4 (H) 05/11/2018 0653   LYMPHSABS 0.9 05/11/2018 0653   MONOABS 2.0 (H) 05/11/2018 0653   EOSABS 0.0 05/11/2018 0653   BASOSABS 0.1 05/11/2018 0653    BMET    Component Value Date/Time   NA 144 05/12/2018 0517   K 3.5 05/12/2018 0517   CL 111 05/12/2018 0517   CO2 25 05/12/2018 0517   GLUCOSE 156 (H) 05/12/2018 0517   BUN 28 (H) 05/12/2018 0517   CREATININE 1.32 (H) 05/12/2018 0517   CALCIUM 9.1 05/12/2018 0517   GFRNONAA 40 (L) 05/12/2018 0517   GFRAA 47 (L) 05/12/2018 0517    INR    Component Value Date/Time   INR 1.16 04/30/2018 0413     Intake/Output Summary (Last 24 hours) at 05/12/2018 0715 Last data filed at 05/12/2018 0350 Gross per 24 hour  Intake 2080.34 ml  Output 2976 ml  Net -895.66 ml     Assessment/Plan:  73 y.o. female is s/p  Open repair of ruptured infrarenal abdominal aortic aneurysm 14 Days Post-Op  GI:  Abdomen still distended with occasional bowel sounds.  Pt reports flatus. NGT with increased output first shift (1800cc), however, last 12 hours only 200cc.   Continue npo and TPN.  Appreciate general surgery Cardiac:  NSR.   Hypertensive-meds adjusted per cardiology-appreciate cardiology  Renal:  Creatinine up from yesterday; will continue to monitor; strict I/O's.  Daily labs Lungs:  O2 increased yesterday due to shortness of breath with HFNC.  Pt not mobilized yesterday due to increased BP and sob.  Continue to work on IS Heme/ID:  Remains afebrile, however wbc increased yesterday.  Will check u/a and cxr; check cbc tomorrow  Disposition:  Possibly CIR.  Continue PT/OT as tolerated and OOB tid.   Doreatha Massed, PA-C [facility] [phone] 05/12/2018 7:15 AM  I have examined the patient, reviewed and agree with above.  Sitting up in chair.  Had large bowel movement.  TNA continues.  Appreciate general surgery's input    Gretta Began, MD 05/12/2018 12:47 PM

## 2018-05-12 NOTE — Progress Notes (Signed)
Patient does not wish to wear NIV at this time, no distress noted at this time.

## 2018-05-12 NOTE — Progress Notes (Signed)
PHARMACY - ADULT TOTAL PARENTERAL NUTRITION CONSULT NOTE   Pharmacy Consult for TPN  Indication: Prolonged ileus  Patient Measurements: Height: 5\' 4"  (162.6 cm) Weight: 192 lb 0.3 oz (87.1 kg) IBW/kg (Calculated) : 54.7 TPN AdjBW (KG): 69.9 Body mass index is 32.96 kg/m.  Assessment: Ruptured AAA repair.  PMH: HTN, HLD, COPD  GI: Albumin 2.5 (2/8). NGT in place. 4500 out.  +stool 2/11. Pt notes flatus. IV PPI/12hrs, docusate, lactulose, Miralax, SennaS Prealbumin = 12  Endo: no h/o DM noted. cbgs have been 120-170s Insulin requirements in the past 24 hours: 11 units  Lytes: K 3.5, Phos 2.6  Renal: Scr 1.15 declining.  Pulm: Flonase, duonebs,  Cards: Post-op afib. IV metoprolol, IV Amio TG 244 >172  Hepatobil: TG 244 (2/11), Tbili elevated 1.6.  Neuro:  ID: WBC 15.9  TPN Access: PICC 2/13 TPN start date: 05/11/18 Nutritional Goals (per RD recommendation on  05/11/18): KCal:1800-2000 Protein: 90-105 Fluid: 1.8-2 L  Current Nutrition: TPN @ 40 ml/hr  Plan:  -Increase TPN to 60 ml/hr -This TPN will provide 72 g protein, 28 g lipids, 288 g dextrose, and 1555 kcal -Reduce D5NS to 50 ml/hr -KCl 10 mEq x 3 -Continue SSI q 6 hrs    Baldemar Friday 05/12/2018,8:13 AM

## 2018-05-12 NOTE — Progress Notes (Signed)
Progress Note  Patient Name: Leah Mata Date of Encounter: 05/12/2018  Primary Cardiologist: Kristeen Miss, MD   Subjective   Feeling better.  Passing flatus  Inpatient Medications    Scheduled Meds: . chlorhexidine  15 mL Mouth Rinse BID  . docusate sodium  100 mg Oral Daily  . fluticasone  2 spray Each Nare Daily  . furosemide  40 mg Intravenous BID  . heparin  5,000 Units Subcutaneous Q8H  . hydrALAZINE  10 mg Intravenous Q6H  . insulin aspart  0-15 Units Subcutaneous Q4H  . ipratropium-albuterol  3 mL Nebulization TID  . labetalol  10 mg Intravenous Q6H  . mouth rinse  15 mL Mouth Rinse q12n4p  . pantoprazole (PROTONIX) IV  40 mg Intravenous Q12H  . senna-docusate  2 tablet Oral QHS  . sodium chloride flush  10-40 mL Intracatheter Q12H  . sodium phosphate  1 enema Rectal Once   Continuous Infusions: . sodium chloride Stopped (05/05/18 2047)  . amiodarone 30 mg/hr (05/12/18 0600)  . dextrose 5 % and 0.9% NaCl 50 mL/hr at 05/12/18 0900  . magnesium sulfate 1 - 4 g bolus IVPB    . potassium chloride 10 mEq (05/12/18 1200)  . TPN ADULT (ION) 40 mL/hr at 05/12/18 0600  . TPN ADULT (ION)     PRN Meds: sodium chloride, alum & mag hydroxide-simeth, bisacodyl, guaiFENesin-dextromethorphan, hydrALAZINE, levalbuterol, magnesium sulfate 1 - 4 g bolus IVPB, morphine injection, ondansetron, oxyCODONE-acetaminophen, phenol, sodium chloride flush, sodium chloride flush   Vital Signs    Vitals:   05/12/18 0001 05/12/18 0126 05/12/18 0354 05/12/18 0742  BP: (!) 140/57  (!) 158/65   Pulse: 76 75 72 76  Resp: (!) 23 (!) 22 (!) 21 16  Temp: 98.7 F (37.1 C)  98.8 F (37.1 C)   TempSrc: Oral  Oral   SpO2: 95% 95% 96% 94%  Weight:   87.1 kg   Height:        Intake/Output Summary (Last 24 hours) at 05/12/2018 1216 Last data filed at 05/12/2018 3338 Gross per 24 hour  Intake 2080.34 ml  Output 2675 ml  Net -594.66 ml   Last 3 Weights 05/12/2018 05/11/2018 05/01/2018    Weight (lbs) 192 lb 0.3 oz 194 lb 10.7 oz 184 lb 15.5 oz  Weight (kg) 87.1 kg 88.3 kg 83.9 kg      Telemetry    Sinus rhythm.  No events - Personally Reviewed  ECG    n/a - Personally Reviewed  Physical Exam   VS:  BP (!) 158/65 (BP Location: Right Leg)   Pulse 76   Temp 98.8 F (37.1 C) (Oral)   Resp 16   Ht 5\' 4"  (1.626 m)   Wt 87.1 kg   LMP 03/29/1993 (Approximate)   SpO2 94%   BMI 32.96 kg/m  , BMI Body mass index is 32.96 kg/m. GENERAL:  Ill-appearing HEENT: Pupils equal round and reactive, fundi not visualized, oral mucosa unremarkable.  NG tube in place NECK:  No jugular venous distention, waveform within normal limits, carotid upstroke brisk and symmetric, no bruits, no thyromegaly LYMPHATICS:  No cervical adenopathy LUNGS:  Clear to auscultation bilaterally HEART:  RRR.  PMI not displaced or sustained,S1 and S2 within normal limits, no S3, no S4, no clicks, no rubs, no murmurs ABD:  Flat, positive bowel sounds normal in frequency in pitch, no bruits, no rebound, no guarding, no midline pulsatile mass, no hepatomegaly, no splenomegaly EXT:  2 plus pulses throughout,  2+ edema, no cyanosis no clubbing SKIN:  No rashes no nodules NEURO:  Cranial nerves II through XII grossly intact, motor grossly intact throughout [facility]:  Cognitively intact, oriented to person place and time   Labs    Chemistry Recent Labs  Lab 05/06/18 0220  05/10/18 1551 05/11/18 0653 05/12/18 0517  NA 133*   < > 141 145 144  K 3.8   < > 4.2 3.4* 3.5  CL 93*   < > 106 112* 111  CO2 25   < > 24 25 25   GLUCOSE 127*   < > 126* 135* 156*  BUN 36*   < > 32* 25* 28*  CREATININE 1.00   < > 1.15* 1.11* 1.32*  CALCIUM 9.4   < > 9.3 9.2 9.1  PROT 6.0*  --   --  5.4*  --   ALBUMIN 2.5*  --   --  2.3*  --   AST 28  --   --  33  --   ALT 25  --   --  27  --   ALKPHOS 79  --   --  84  --   BILITOT 3.5*  --   --  1.6*  --   GFRNONAA 57*   < > 48* 50* 40*  GFRAA >60   < > 55* 58* 47*  ANIONGAP  15   < > 11 8 8    < > = values in this interval not displayed.     Hematology Recent Labs  Lab 05/09/18 0242 05/10/18 0320 05/11/18 0653  WBC 15.3* 15.9* 18.2*  RBC 4.55 4.24 4.19  HGB 13.2 12.5 12.1  HCT 39.5 36.9 36.2  MCV 86.8 87.0 86.4  MCH 29.0 29.5 28.9  MCHC 33.4 33.9 33.4  RDW 16.8* 17.2* 17.4*  PLT 222 215 234    Cardiac EnzymesNo results for input(s): TROPONINI in the last 168 hours. No results for input(s): TROPIPOC in the last 168 hours.   BNP Recent Labs  Lab 05/07/18 0234  BNP 86.8     DDimer No results for input(s): DDIMER in the last 168 hours.   Radiology    Dg Chest Port 1 View  Result Date: 05/12/2018 CLINICAL DATA:  Leukocytosis EXAM: PORTABLE CHEST 1 VIEW COMPARISON:  Three days ago FINDINGS: Right upper extremity PICC with tip at the upper cavoatrial junction. The orogastric tube tip is at the distal stomach. Hazy density at the right base, stable. There is atelectasis and pleural fluid on preceding abdominal CT. Borderline heart size. IMPRESSION: 1. Unchanged hazy opacity the right more than left base correlating with atelectasis and pleural fluid on most recent abdominal CT. Superimposed infection is not excluded. 2. Unremarkable hardware positioning. Electronically Signed   By: Marnee Spring M.D.   On: 05/12/2018 11:25   Dg Abd Portable 1v-small Bowel Obstruction Protocol-initial, 8 Hr Delay  Result Date: 05/11/2018 CLINICAL DATA:  Small bowel obstruction EXAM: PORTABLE ABDOMEN - 1 VIEW COMPARISON:  05/11/2018 FINDINGS: NG tube tip is in the distal stomach. Dilated small bowel loops again noted, not significantly changed since prior study. Gas noted within the colon. Surgical skin staples noted along the midline. IMPRESSION: Continue small bowel dilatation, not significantly changed. Electronically Signed   By: Charlett Nose M.D.   On: 05/11/2018 22:59   Dg Abd Portable 1v-small Bowel Obstruction Protocol-initial, 8 Hr Delay  Result Date:  05/11/2018 CLINICAL DATA:  8 hour follow-up exam for small-bowel dilatation. EXAM: PORTABLE ABDOMEN - 1 VIEW  COMPARISON:  05/10/2018 FINDINGS: Gastric catheter is noted within the stomach. Persistent small bowel dilatation is seen. Administered contrast is not well appreciated and likely diluted. Postsurgical changes are again seen. No free air is noted. No other focal abnormality is seen. IMPRESSION: Generalized stable small bowel dilatation consistent with ileus or small-bowel obstruction. Previously administered contrast is not well appreciated on today's exam. Electronically Signed   By: Alcide CleverMark  Lukens M.D.   On: 05/11/2018 03:23   [place] Ekg Site Rite  Result Date: 05/11/2018 If Site Rite image not attached, placement could not be confirmed due to current cardiac rhythm.   Cardiac Studies   Echo 05/03/18: IMPRESSIONS   1. The left ventricle has hyperdynamic systolic function of >65%. The cavity size is normal. There is no increased left ventricular wall thickness. Echo evidence of impaired diastolic relaxation.  2. The right ventricle has normal systolic function. The cavity in normal in size. There is no increase in right ventricular wall thickness.  3. The mitral valve is normal in structure.  4. The aortic valve has an indeterminant number of cusps.  5. The pulmonic valve is Not well seen. Pulmonic valve regurgitation was not assessed by color flow Doppler.  6. There is mild dilatation of the aortic root.  7. The interatrial septum was not assessed.  8. Extremely limited due to poor sound wave transmission; definity used; vigorous LV systolic function; mild diastolic dysfunction.  Patient Profile     73 y.o. female  with hypertension, hyperlipidemia, and COPD who developed post operative atrial fibrillation after repair of a ruptured AAA.  Assessment & Plan    # Post-operative atrial fibrillation: Remains in sinus rhythm on IV amiodarone.  Continue diuresis with IV lasix.   #  Hypertension: BP better but still elevated.  Increase labetalol and hydralazine to q6h.       For questions or updates, please contact [facility] Please consult [email or web address] for contact info under        Signed, Chilton Siiffany [place], MD  05/12/2018, 12:16 PM

## 2018-05-13 LAB — GLUCOSE, CAPILLARY
GLUCOSE-CAPILLARY: 136 mg/dL — AB (ref 70–99)
Glucose-Capillary: 136 mg/dL — ABNORMAL HIGH (ref 70–99)
Glucose-Capillary: 140 mg/dL — ABNORMAL HIGH (ref 70–99)
Glucose-Capillary: 147 mg/dL — ABNORMAL HIGH (ref 70–99)
Glucose-Capillary: 149 mg/dL — ABNORMAL HIGH (ref 70–99)
Glucose-Capillary: 149 mg/dL — ABNORMAL HIGH (ref 70–99)

## 2018-05-13 LAB — BASIC METABOLIC PANEL
Anion gap: 9 (ref 5–15)
BUN: 35 mg/dL — ABNORMAL HIGH (ref 8–23)
CHLORIDE: 114 mmol/L — AB (ref 98–111)
CO2: 24 mmol/L (ref 22–32)
Calcium: 9 mg/dL (ref 8.9–10.3)
Creatinine, Ser: 1.2 mg/dL — ABNORMAL HIGH (ref 0.44–1.00)
GFR calc Af Amer: 53 mL/min — ABNORMAL LOW (ref >60)
GFR calc non Af Amer: 45 mL/min — ABNORMAL LOW (ref >60)
Glucose, Bld: 157 mg/dL — ABNORMAL HIGH (ref 70–99)
Potassium: 3.7 mmol/L (ref 3.5–5.1)
SODIUM: 147 mmol/L — AB (ref 135–145)

## 2018-05-13 LAB — CBC
HCT: 34.3 % — ABNORMAL LOW (ref 36.0–46.0)
Hemoglobin: 11.3 g/dL — ABNORMAL LOW (ref 12.0–15.0)
MCH: 28.8 pg (ref 26.0–34.0)
MCHC: 32.9 g/dL (ref 30.0–36.0)
MCV: 87.5 fL (ref 80.0–100.0)
Platelets: 142 10*3/uL — ABNORMAL LOW (ref 150–400)
RBC: 3.92 MIL/uL (ref 3.87–5.11)
RDW: 18.3 % — ABNORMAL HIGH (ref 11.5–15.5)
WBC: 18.6 10*3/uL — AB (ref 4.0–10.5)
nRBC: 0 % (ref 0.0–0.2)

## 2018-05-13 MED ORDER — FUROSEMIDE 10 MG/ML IJ SOLN
80.0000 mg | Freq: Two times a day (BID) | INTRAMUSCULAR | Status: DC
  Administered 2018-05-13 – 2018-05-16 (×6): 80 mg via INTRAVENOUS
  Filled 2018-05-13 (×7): qty 8

## 2018-05-13 MED ORDER — DEXTROSE-NACL 5-0.9 % IV SOLN
INTRAVENOUS | Status: DC
  Administered 2018-05-13: 18:00:00 via INTRAVENOUS

## 2018-05-13 MED ORDER — TRAVASOL 10 % IV SOLN
INTRAVENOUS | Status: AC
  Administered 2018-05-13: 17:00:00 via INTRAVENOUS
  Filled 2018-05-13: qty 873.6

## 2018-05-13 MED ORDER — HYDRALAZINE HCL 20 MG/ML IJ SOLN
15.0000 mg | Freq: Four times a day (QID) | INTRAMUSCULAR | Status: DC
  Administered 2018-05-13 – 2018-05-18 (×19): 15 mg via INTRAVENOUS
  Filled 2018-05-13 (×20): qty 1

## 2018-05-13 NOTE — Progress Notes (Addendum)
  Progress Note    05/13/2018 8:50 AM 15 Days Post-Op  Subjective:  Feels better today; had a bowel movement yesterday and this morning. She also walked this morning.  Afebrile HR  50's-70's NSR 130's-150's systolic 97% 5LO2 HFNC  Vitals:   05/13/18 0720 05/13/18 0844  BP:  (!) 147/69  Pulse:    Resp:  (!) 22  Temp:  97.7 F (36.5 C)  SpO2: 98%     Physical Exam: Cardiac:  regular Lungs:  Expiratory wheezes bilaterally  Incisions:  Clean and dry with staples in tact Extremities:  Bilateral feet are warm and well perfused. Abdomen:  Soft, still distended, non tender; +flatus and BM  CBC    Component Value Date/Time   WBC 18.6 (H) 05/13/2018 0417   RBC 3.92 05/13/2018 0417   HGB 11.3 (L) 05/13/2018 0417   HCT 34.3 (L) 05/13/2018 0417   PLT 142 (L) 05/13/2018 0417   MCV 87.5 05/13/2018 0417   MCH 28.8 05/13/2018 0417   MCHC 32.9 05/13/2018 0417   RDW 18.3 (H) 05/13/2018 0417   LYMPHSABS 0.9 05/11/2018 0653   MONOABS 2.0 (H) 05/11/2018 0653   EOSABS 0.0 05/11/2018 0653   BASOSABS 0.1 05/11/2018 0653    BMET    Component Value Date/Time   NA 147 (H) 05/13/2018 0417   K 3.7 05/13/2018 0417   CL 114 (H) 05/13/2018 0417   CO2 24 05/13/2018 0417   GLUCOSE 157 (H) 05/13/2018 0417   BUN 35 (H) 05/13/2018 0417   CREATININE 1.20 (H) 05/13/2018 0417   CALCIUM 9.0 05/13/2018 0417   GFRNONAA 45 (L) 05/13/2018 0417   GFRAA 53 (L) 05/13/2018 0417    INR    Component Value Date/Time   INR 1.16 04/30/2018 0413     Intake/Output Summary (Last 24 hours) at 05/13/2018 0850 Last data filed at 05/13/2018 0522 Gross per 24 hour  Intake 2181.62 ml  Output 1400 ml  Net 781.62 ml   NGT output:  350cc/24hr  Assessment:  73 y.o. female is s/p:  Open repair of ruptured infrarenal abdominal aortic aneurysm  15 Days Post-Op  Plan: GI:  Pt with BM yesterday and this am.  NGT output decreased from 4L to 2L to 350cc yesterday.  General surgery plan to clamp tube today.   Continue TPN and npo.  Abdomen still distended but non tender Cardiac:  Hypertension improved.  Remains in NSR.  Appreciate cardiology Renal:  Creatinine improved today to 1.2 today from 1.3 yesterday. Pulmonary:  Expiratory wheezes-has Duoneb ordered tid and Xopenex nebulizer q6h prn as well as diuresis.  Continue mobilization, oob, and incentive spirometery Heme/ID:  hgb down slightly; pt tolerating.  Leukocytosis essentially unchanged and pt remains afebrile.  U/a unremarkable.   -DVT prophylaxis:  Sq heparin   Doreatha Massed, PA-C [facility] [phone] 05/13/2018 8:50 AM  Agree with above.   She feels hungry.  Hopefully ileus resolving  Fabienne Bruns, MD [facility] [place] Office: [phone] Pager: [phone]

## 2018-05-13 NOTE — Progress Notes (Signed)
Progress Note  Patient Name: Leah Mata Date of Encounter: 05/13/2018  Primary Cardiologist: Kristeen Miss, MD   Subjective   Feeling better.  Ambulated without chest pain or shortness of breath.  Inpatient Medications    Scheduled Meds: . chlorhexidine  15 mL Mouth Rinse BID  . sennosides  10 mL Per Tube QHS   And  . docusate  100 mg Per Tube QHS  . docusate  100 mg Per Tube Daily  . fluticasone  2 spray Each Nare Daily  . furosemide  40 mg Intravenous BID  . heparin  5,000 Units Subcutaneous Q8H  . hydrALAZINE  10 mg Intravenous Q6H  . insulin aspart  0-15 Units Subcutaneous Q4H  . ipratropium-albuterol  3 mL Nebulization TID  . labetalol  10 mg Intravenous Q6H  . mouth rinse  15 mL Mouth Rinse q12n4p  . pantoprazole (PROTONIX) IV  40 mg Intravenous Q12H  . sodium chloride flush  10-40 mL Intracatheter Q12H  . sodium phosphate  1 enema Rectal Once   Continuous Infusions: . sodium chloride 250 mL (05/13/18 1033)  . amiodarone 30 mg/hr (05/13/18 0513)  . dextrose 5 % and 0.9% NaCl 50 mL/hr at 05/13/18 1037  . dextrose 5 % and 0.9% NaCl    . magnesium sulfate 1 - 4 g bolus IVPB    . TPN ADULT (ION) 60 mL/hr at 05/12/18 1819  . TPN ADULT (ION)     PRN Meds: sodium chloride, alum & mag hydroxide-simeth, bisacodyl, guaiFENesin-dextromethorphan, hydrALAZINE, levalbuterol, magnesium sulfate 1 - 4 g bolus IVPB, morphine injection, ondansetron, oxyCODONE-acetaminophen, phenol, sodium chloride flush, sodium chloride flush   Vital Signs    Vitals:   05/13/18 0418 05/13/18 0720 05/13/18 0844 05/13/18 1107  BP: (!) 143/55  (!) 147/69 (!) 152/87  Pulse: 71   74  Resp: (!) 23  (!) 22 (!) 25  Temp: 98.5 F (36.9 C)  97.7 F (36.5 C) 97.8 F (36.6 C)  TempSrc: Axillary  Axillary Oral  SpO2: 97% 98%  98%  Weight: 88.7 kg     Height:        Intake/Output Summary (Last 24 hours) at 05/13/2018 1112 Last data filed at 05/13/2018 0844 Gross per 24 hour  Intake 2181.62 ml    Output 1400 ml  Net 781.62 ml   Last 3 Weights 05/13/2018 05/12/2018 05/11/2018  Weight (lbs) 195 lb 8.8 oz 192 lb 0.3 oz 194 lb 10.7 oz  Weight (kg) 88.7 kg 87.1 kg 88.3 kg      Telemetry    Sinus rhythm.  No events - Personally Reviewed  ECG    n/a - Personally Reviewed  Physical Exam   VS:  BP (!) 152/87 (BP Location: Right Arm)   Pulse 74   Temp 97.8 F (36.6 C) (Oral)   Resp (!) 25   Ht 5\' 4"  (1.626 m)   Wt 88.7 kg   LMP 03/29/1993 (Approximate)   SpO2 98%   BMI 33.57 kg/m  , BMI Body mass index is 33.57 kg/m. GENERAL:  Ill-appearing HEENT: Pupils equal round and reactive, fundi not visualized, oral mucosa unremarkable.  NG tube in place NECK:  + jugular venous distention, waveform within normal limits, carotid upstroke brisk and symmetric, no bruits, no thyromegaly LYMPHATICS:  No cervical adenopathy LUNGS:  Scattered wheezes HEART:  RRR.  PMI not displaced or sustained,S1 and S2 within normal limits, no S3, no S4, no clicks, no rubs, no murmurs ABD:  Flat, positive bowel sounds  normal in frequency in pitch, no bruits, no rebound, no guarding, no midline pulsatile mass, no hepatomegaly, no splenomegaly EXT:  2 plus pulses throughout, 2+ edema, no cyanosis no clubbing SKIN:  No rashes no nodules NEURO:  Cranial nerves II through XII grossly intact, [facility]ly [facility]  Cognitively intact, oriented to person place and time   Labs    Chemistry Recent Labs  Lab 05/11/18 0653 05/12/18 0517 05/13/18 0417  NA 145 144 147*  K 3.4* 3.5 3.7  CL 112* 111 114*  CO2 25 25 24   GLUCOSE 135* 156* 157*  BUN 25* 28* 35*  CREATININE 1.11* 1.32* 1.20*  CALCIUM 9.2 9.1 9.0  PROT 5.4*  --   --   ALBUMIN 2.3*  --   --   AST 33  --   --   ALT 27  --   --   ALKPHOS 84  --   --   BILITOT 1.6*  --   --   GFRNONAA 50* 40* 45*  GFRAA 58* 47* 53*  ANIONGAP 8 8 9      Hematology Recent Labs  Lab 05/10/18 0320 05/11/18 0653 05/13/18 0417  WBC 15.9*  18.2* 18.6*  RBC 4.24 4.19 3.92  HGB 12.5 12.1 11.3*  HCT 36.9 36.2 34.3*  MCV 87.0 86.4 87.5  MCH 29.5 28.9 28.8  MCHC 33.9 33.4 32.9  RDW 17.2* 17.4* 18.3*  PLT 215 234 142*    Cardiac EnzymesNo results for input(s): TROPONINI in the last 168 hours. No results for input(s): TROPIPOC in the last 168 hours.   BNP Recent Labs  Lab 05/07/18 0234  BNP 86.8     DDimer No results for input(s): DDIMER in the last 168 hours.   Radiology    Dg Chest Port 1 View  Result Date: 05/12/2018 CLINICAL DATA:  Leukocytosis EXAM: PORTABLE CHEST 1 VIEW COMPARISON:  Three days ago FINDINGS: Right upper extremity PICC with tip at the upper cavoatrial junction. The orogastric tube tip is at the distal stomach. Hazy density at the right base, stable. There is atelectasis and pleural fluid on preceding abdominal CT. Borderline heart size. IMPRESSION: 1. Unchanged hazy opacity the right more than left base correlating with atelectasis and pleural fluid on most recent abdominal CT. Superimposed infection is not excluded. 2. Unremarkable hardware positioning. Electronically Signed   By: Marnee SpringJonathon  Watts M.D.   On: 05/12/2018 11:25   Dg Abd Portable 1v-small Bowel Obstruction Protocol-initial, 8 Hr Delay  Result Date: 05/11/2018 CLINICAL DATA:  Small bowel obstruction EXAM: PORTABLE ABDOMEN - 1 VIEW COMPARISON:  05/11/2018 FINDINGS: NG tube tip is in the distal stomach. Dilated small bowel loops again noted, not significantly changed since prior study. Gas noted within the colon. Surgical skin staples noted along the midline. IMPRESSION: Continue small bowel dilatation, not significantly changed. Electronically Signed   By: Charlett NoseKevin  Dover M.D.   On: 05/11/2018 22:59    Cardiac Studies   Echo 05/03/18: IMPRESSIONS   1. The left ventricle has hyperdynamic systolic function of >65%. The cavity size is normal. There is no increased left ventricular wall thickness. Echo evidence of impaired diastolic relaxation.   2. The right ventricle has normal systolic function. The cavity in normal in size. There is no increase in right ventricular wall thickness.  3. The mitral valve is normal in structure.  4. The aortic valve has an indeterminant number of cusps.  5. The pulmonic valve is Not well seen. Pulmonic valve regurgitation was not assessed  by color flow Doppler.  6. There is mild dilatation of the aortic root.  7. The interatrial septum was not assessed.  8. Extremely limited due to poor sound wave transmission; definity used; vigorous LV systolic function; mild diastolic dysfunction.  Patient Profile     73 y.o. female  with hypertension, hyperlipidemia, and COPD who developed post operative atrial fibrillation after repair of a ruptured AAA.  Assessment & Plan    # Post-operative atrial fibrillation: Remains in sinus rhythm on IV amiodarone.  Stop IV amiodarone.  No plan for anticoagulation unless she has recurrent afib.  # Hypertension: BP better but still elevated. Continue labetalol and increase hydralazine to 15 mg q6h.  # Volume overload: WIll increase lasix to 80mg  bid.  She has edema and a positive fluid balance.     For questions or updates, please contact [facility] Please consult [email or web address] for contact info under        Signed, Chilton Si, MD  05/13/2018, 11:12 AM

## 2018-05-13 NOTE — Progress Notes (Signed)
PHARMACY - ADULT TOTAL PARENTERAL NUTRITION CONSULT NOTE   Pharmacy Consult for TPN  Indication: Prolonged ileus  Patient Measurements: Height: 5\' 4"  (162.6 cm) Weight: 195 lb 8.8 oz (88.7 kg) IBW/kg (Calculated) : 54.7 TPN AdjBW (KG): 69.9 Body mass index is 33.57 kg/m.  Assessment: Ruptured AAA repair.  PMH: HTN, HLD, COPD  GI: Albumin 2.5 (2/8). NGT in place. 250 out/24h. Pt notes flatus. IV PPI/12hrs, docusate, lactulose, Miralax, SennaS Prealbumin = 12. Large BM 2/14  Endo: no h/o DM noted. cbgs have been 120-160s Insulin requirements in the past 24 hours: 12 units  Lytes: K 3.7, Phos 2.6, Na 147, other lytes ok  Renal: Scr 1.2  Pulm: Flonase, duonebs,  Cards: Post-op afib. IV metoprolol, IV Amio TG 244 >172  Hepatobil: TG 244 > 172, Tbili 1.6, LFTs wnl 2/13  Neuro: A&O  ID: WBC 15 > 18, no active abx  TPN Access: PICC 2/13 TPN start date: 05/11/18 Nutritional Goals (per RD recommendation on  05/11/18): KCal:1800-2000 Protein: 90-105 Fluid: 1.8-2 L  Current Nutrition: TPN @ 60 ml/hr  Plan:  -Increase TPN to 70 ml/hr -This TPN will provide 87 g protein, 33 g lipids, 336 g dextrose, and 1827 kcal, meeting 100% of patient needs -Reduce D5NS to 30 ml/hr -Continue SSI q 6 hrs    Leah Mata Friday 05/13/2018,7:28 AM

## 2018-05-13 NOTE — Progress Notes (Signed)
Patient ID: Leah Mata, female   DOB: Jul 07, 1945, 73 y.o.   MRN: [id number]    15 Days Post-Op  Subjective: Patient had a BM yesterday and passed flatus.  Doesn't think she has had any this morning.  NGT output down today to 300cc in the last 24 hrs.  Objective: Vital signs in last 24 hours: Temp:  [97.4 F (36.3 C)-98.5 F (36.9 C)] 97.7 F (36.5 C) (02/15 0844) Pulse Rate:  [62-72] 71 (02/15 0418) Resp:  [18-24] 22 (02/15 0844) BP: (130-170)/(55-78) 147/69 (02/15 0844) SpO2:  [93 %-99 %] 98 % (02/15 0720) Weight:  [88.7 kg] 88.7 kg (02/15 0418) Last BM Date: 05/12/18  Intake/Output from previous day: 02/14 0701 - 02/15 0700 In: 2181.6 [I.V.:2181.6] Out: 1400 [Urine:1050; Emesis/NG output:350] Intake/Output this shift: No intake/output data recorded.  PE: Abd: soft, obese, but still with some distention, some BS, midline incision is c/d/i with staples.  NGT with some bilious output in the cannister.  Lab Results:  Recent Labs    05/11/18 0653 05/13/18 0417  WBC 18.2* 18.6*  HGB 12.1 11.3*  HCT 36.2 34.3*  PLT 234 142*   BMET Recent Labs    05/12/18 0517 05/13/18 0417  NA 144 147*  K 3.5 3.7  CL 111 114*  CO2 25 24  GLUCOSE 156* 157*  BUN 28* 35*  CREATININE 1.32* 1.20*  CALCIUM 9.1 9.0   PT/INR No results for input(s): LABPROT, INR in the last 72 hours. CMP     Component Value Date/Time   NA 147 (H) 05/13/2018 0417   K 3.7 05/13/2018 0417   CL 114 (H) 05/13/2018 0417   CO2 24 05/13/2018 0417   GLUCOSE 157 (H) 05/13/2018 0417   BUN 35 (H) 05/13/2018 0417   CREATININE 1.20 (H) 05/13/2018 0417   CALCIUM 9.0 05/13/2018 0417   PROT 5.4 (L) 05/11/2018 0653   ALBUMIN 2.3 (L) 05/11/2018 0653   AST 33 05/11/2018 0653   ALT 27 05/11/2018 0653   ALKPHOS 84 05/11/2018 0653   BILITOT 1.6 (H) 05/11/2018 0653   GFRNONAA 45 (L) 05/13/2018 0417   GFRAA 53 (L) 05/13/2018 0417   Lipase  No results found for: LIPASE     Studies/Results: Dg Chest Port 1  View  Result Date: 05/12/2018 CLINICAL DATA:  Leukocytosis EXAM: PORTABLE CHEST 1 VIEW COMPARISON:  Three days ago FINDINGS: Right upper extremity PICC with tip at the upper cavoatrial junction. The orogastric tube tip is at the distal stomach. Hazy density at the right base, stable. There is atelectasis and pleural fluid on preceding abdominal CT. Borderline heart size. IMPRESSION: 1. Unchanged hazy opacity the right more than left base correlating with atelectasis and pleural fluid on most recent abdominal CT. Superimposed infection is not excluded. 2. Unremarkable hardware positioning. Electronically Signed   By: Marnee Spring M.D.   On: 05/12/2018 11:25   Dg Abd Portable 1v-small Bowel Obstruction Protocol-initial, 8 Hr Delay  Result Date: 05/11/2018 CLINICAL DATA:  Small bowel obstruction EXAM: PORTABLE ABDOMEN - 1 VIEW COMPARISON:  05/11/2018 FINDINGS: NG tube tip is in the distal stomach. Dilated small bowel loops again noted, not significantly changed since prior study. Gas noted within the colon. Surgical skin staples noted along the midline. IMPRESSION: Continue small bowel dilatation, not significantly changed. Electronically Signed   By: Charlett Nose M.D.   On: 05/11/2018 22:59    Anti-infectives: Anti-infectives (From admission, onward)   Start     Dose/Rate Route Frequency Ordered Stop  05/03/18 1815  cefTRIAXone (ROCEPHIN) 1 g in sodium chloride 0.9 % 100 mL IVPB  Status:  Discontinued     1 g 200 mL/hr over 30 Minutes Intravenous Every 24 hours 05/03/18 1728 05/09/18 1148   04/29/18 0400  ceFAZolin (ANCEF) IVPB 2g/100 mL premix     2 g 200 mL/hr over 30 Minutes Intravenous Every 8 hours 04/29/18 0044 04/29/18 1259       Assessment/Plan Current smoker COPD HTN HLD Ruptured infrarenal abdominal aortic aneurysms/p open repair 04/28/18 Dr. Tawanna Cooler Early Paroxysmal atrial fibrillation  SBO vs Ileus - Passed some flatus and a loose BM yesterday. -clamp NGT today and check  residual as NGT output seems to be decreasing. -cont to follow closely  ID -none currently VTE -sq heparin FEN -IVF, NPO/NGT, TPN Foley -none Follow up -TBD   LOS: 15 days    Letha Cape , [facility] 05/13/2018, 9:07 AM Pager: [phone]

## 2018-05-14 LAB — GLUCOSE, CAPILLARY
GLUCOSE-CAPILLARY: 126 mg/dL — AB (ref 70–99)
Glucose-Capillary: 123 mg/dL — ABNORMAL HIGH (ref 70–99)
Glucose-Capillary: 138 mg/dL — ABNORMAL HIGH (ref 70–99)
Glucose-Capillary: 140 mg/dL — ABNORMAL HIGH (ref 70–99)
Glucose-Capillary: 147 mg/dL — ABNORMAL HIGH (ref 70–99)
Glucose-Capillary: 149 mg/dL — ABNORMAL HIGH (ref 70–99)
Glucose-Capillary: 155 mg/dL — ABNORMAL HIGH (ref 70–99)

## 2018-05-14 MED ORDER — TRAVASOL 10 % IV SOLN
INTRAVENOUS | Status: AC
  Administered 2018-05-14: 18:00:00 via INTRAVENOUS
  Filled 2018-05-14: qty 873.6

## 2018-05-14 NOTE — Progress Notes (Signed)
RN made aware of increasing BP. BP check on r leg and is 169/85. Hydralazine prn given. Lacy Duverney, RN

## 2018-05-14 NOTE — Progress Notes (Addendum)
  Progress Note    05/14/2018 10:26 AM 16 Days Post-Op  Subjective:  No complaints; denies nausea/vomiting with tube clamped.  Remains afebrile HR 60's-90's NSR 130's-140's systolic 99% 2LO2NC  Vitals:   05/14/18 0733 05/14/18 0815  BP: (!) 132/58 138/61  Pulse: 73 81  Resp: (!) 21 (!) 22  Temp: 97.7 F (36.5 C) (!) 97.3 F (36.3 C)  SpO2: 99% 99%    Physical Exam: Cardiac:  regular Lungs:  Exp wheezes Extremities:  Bilateral feet are warm and well perfused Abdomen:  Soft, still distended; more BS today.  CBC    Component Value Date/Time   WBC 18.6 (H) 05/13/2018 0417   RBC 3.92 05/13/2018 0417   HGB 11.3 (L) 05/13/2018 0417   HCT 34.3 (L) 05/13/2018 0417   PLT 142 (L) 05/13/2018 0417   MCV 87.5 05/13/2018 0417   MCH 28.8 05/13/2018 0417   MCHC 32.9 05/13/2018 0417   RDW 18.3 (H) 05/13/2018 0417   LYMPHSABS 0.9 05/11/2018 0653   MONOABS 2.0 (H) 05/11/2018 0653   EOSABS 0.0 05/11/2018 0653   BASOSABS 0.1 05/11/2018 0653    BMET    Component Value Date/Time   NA 147 (H) 05/13/2018 0417   K 3.7 05/13/2018 0417   CL 114 (H) 05/13/2018 0417   CO2 24 05/13/2018 0417   GLUCOSE 157 (H) 05/13/2018 0417   BUN 35 (H) 05/13/2018 0417   CREATININE 1.20 (H) 05/13/2018 0417   CALCIUM 9.0 05/13/2018 0417   GFRNONAA 45 (L) 05/13/2018 0417   GFRAA 53 (L) 05/13/2018 0417    INR    Component Value Date/Time   INR 1.16 04/30/2018 0413     Intake/Output Summary (Last 24 hours) at 05/14/2018 1026 Last data filed at 05/14/2018 0815 Gross per 24 hour  Intake 2631.72 ml  Output 1160 ml  Net 1471.72 ml     Assessment:  73 y.o. female is s/p:  Open repair of ruptured infrarenal abdominal aortic aneurysm  16 Days Post-Op  Plan: -pt continues to improve.  She had another BM and continues to pass flatus.  Did well with NGT clamped.  Clear liquid diet ordered for this morning.  Continues to be in NSR.  If tolerates diet, antihypertensive meds may be started po as  well as other meds. -DVT prophylaxis:  Sq heparin -check labs in am-BMP pending today -talked with son about checking for AAA around age 10 given both parents have had ruptured AAA   Doreatha Massed, PA-C [facility] [phone] 05/14/2018 10:26 AM   Agree with above.  Hopefully NG out tomorrow and advance diet  Fabienne Bruns, MD [facility] [place] Office: [phone] Pager: [phone]

## 2018-05-14 NOTE — Progress Notes (Signed)
Progress Note  Patient Name: Leah Mata Date of Encounter: 05/14/2018  Primary Cardiologist: Kristeen Miss, MD   Subjective   Feeling better.  No chest pain, shortness of breath or palpitations.   Inpatient Medications    Scheduled Meds: . chlorhexidine  15 mL Mouth Rinse BID  . sennosides  10 mL Per Tube QHS   And  . docusate  100 mg Per Tube QHS  . docusate  100 mg Per Tube Daily  . fluticasone  2 spray Each Nare Daily  . furosemide  80 mg Intravenous BID  . heparin  5,000 Units Subcutaneous Q8H  . hydrALAZINE  15 mg Intravenous Q6H  . insulin aspart  0-15 Units Subcutaneous Q4H  . ipratropium-albuterol  3 mL Nebulization TID  . labetalol  10 mg Intravenous Q6H  . mouth rinse  15 mL Mouth Rinse q12n4p  . pantoprazole (PROTONIX) IV  40 mg Intravenous Q12H  . sodium chloride flush  10-40 mL Intracatheter Q12H  . sodium phosphate  1 enema Rectal Once   Continuous Infusions: . sodium chloride 250 mL (05/13/18 1033)  . magnesium sulfate 1 - 4 g bolus IVPB    . TPN ADULT (ION) 70 mL/hr at 05/14/18 0409  . TPN ADULT (ION)     PRN Meds: sodium chloride, alum & mag hydroxide-simeth, bisacodyl, guaiFENesin-dextromethorphan, hydrALAZINE, levalbuterol, magnesium sulfate 1 - 4 g bolus IVPB, morphine injection, ondansetron, oxyCODONE-acetaminophen, phenol, sodium chloride flush, sodium chloride flush   Vital Signs    Vitals:   05/14/18 0552 05/14/18 0724 05/14/18 0733 05/14/18 0815  BP: (!) 145/65  (!) 132/58 138/61  Pulse: 78  73 81  Resp: (!) 21  (!) 21 (!) 22  Temp: 97.7 F (36.5 C)  97.7 F (36.5 C) (!) 97.3 F (36.3 C)  TempSrc: Oral  Oral Axillary  SpO2: 97% 100% 99% 99%  Weight: 87.3 kg     Height:        Intake/Output Summary (Last 24 hours) at 05/14/2018 0929 Last data filed at 05/14/2018 0815 Gross per 24 hour  Intake 2631.72 ml  Output 1160 ml  Net 1471.72 ml   Last 3 Weights 05/14/2018 05/13/2018 05/12/2018  Weight (lbs) 192 lb 6.4 oz 195 lb 8.8 oz  192 lb 0.3 oz  Weight (kg) 87.272 kg 88.7 kg 87.1 kg      Telemetry    Sinus rhythm.  No events - Personally Reviewed  ECG    n/a - Personally Reviewed  Physical Exam   VS:  BP 138/61 (BP Location: Right Leg)   Pulse 81   Temp (!) 97.3 F (36.3 C) (Axillary)   Resp (!) 22   Ht 5\' 4"  (1.626 m)   Wt 87.3 kg   LMP 03/29/1993 (Approximate)   SpO2 99%   BMI 33.03 kg/m  , BMI Body mass index is 33.03 kg/m. GENERAL:  Ill-appearing but better. HEENT: Pupils equal round and reactive, fundi not visualized, oral mucosa unremarkable.  NG tube in place NECK:  + jugular venous distention, waveform within normal limits, carotid upstroke brisk and symmetric, no bruits, no thyromegaly LYMPHATICS:  No cervical adenopathy LUNGS:  Scattered wheezes HEART:  RRR.  PMI not displaced or sustained,S1 and S2 within normal limits, no S3, no S4, no clicks, no rubs, no murmurs ABD:  Flat, positive bowel sounds normal in frequency in pitch, no bruits, no rebound, no guarding, no midline pulsatile mass, no hepatomegaly, no splenomegaly EXT:  2 plus pulses throughout, 2+ edema, no  cyanosis no clubbing SKIN:  No rashes no nodules NEURO:  Cranial nerves II through XII grossly intact, motor grossly intact throughout [facility]:  Cognitively intact, oriented to person place and time   Labs    Chemistry Recent Labs  Lab 05/11/18 0653 05/12/18 0517 05/13/18 0417  NA 145 144 147*  K 3.4* 3.5 3.7  CL 112* 111 114*  CO2 25 25 24   GLUCOSE 135* 156* 157*  BUN 25* 28* 35*  CREATININE 1.11* 1.32* 1.20*  CALCIUM 9.2 9.1 9.0  PROT 5.4*  --   --   ALBUMIN 2.3*  --   --   AST 33  --   --   ALT 27  --   --   ALKPHOS 84  --   --   BILITOT 1.6*  --   --   GFRNONAA 50* 40* 45*  GFRAA 58* 47* 53*  ANIONGAP 8 8 9      Hematology Recent Labs  Lab 05/10/18 0320 05/11/18 0653 05/13/18 0417  WBC 15.9* 18.2* 18.6*  RBC 4.24 4.19 3.92  HGB 12.5 12.1 11.3*  HCT 36.9 36.2 34.3*  MCV 87.0 86.4 87.5  MCH 29.5  28.9 28.8  MCHC 33.9 33.4 32.9  RDW 17.2* 17.4* 18.3*  PLT 215 234 142*    Cardiac EnzymesNo results for input(s): TROPONINI in the last 168 hours. No results for input(s): TROPIPOC in the last 168 hours.   BNP No results for input(s): BNP, PROBNP in the last 168 hours.   DDimer No results for input(s): DDIMER in the last 168 hours.   Radiology    No results found.  Cardiac Studies   Echo 05/03/18: IMPRESSIONS   1. The left ventricle has hyperdynamic systolic function of >65%. The cavity size is normal. There is no increased left ventricular wall thickness. Echo evidence of impaired diastolic relaxation.  2. The right ventricle has normal systolic function. The cavity in normal in size. There is no increase in right ventricular wall thickness.  3. The mitral valve is normal in structure.  4. The aortic valve has an indeterminant number of cusps.  5. The pulmonic valve is Not well seen. Pulmonic valve regurgitation was not assessed by color flow Doppler.  6. There is mild dilatation of the aortic root.  7. The interatrial septum was not assessed.  8. Extremely limited due to poor sound wave transmission; definity used; vigorous LV systolic function; mild diastolic dysfunction.  Patient Profile     73 y.o. female  with hypertension, hyperlipidemia, and COPD who developed post operative atrial fibrillation after repair of a ruptured AAA.  Assessment & Plan    # Post-operative atrial fibrillation: Remains in sinus rhythm.  IV amiodarone was stopped 2/15.  No plan for anticoagulation unless she has recurrent afib.  LVEF >65% and LA is normal in size.  Will add on  TSH.  # Hypertension: BP much better.  Home amlodipine and lisinopril are on hold while NPO. Continue labetalol and hydralazine.  # Volume overload: Continue lasix to 80mg  bid.  She has edema and a positive fluid balance.  BMP pending.     For questions or updates, please contact [facility] Please consult  [email or web address] for contact info under        Signed, Chilton Si, MD  05/14/2018, 9:29 AM

## 2018-05-14 NOTE — Progress Notes (Signed)
PHARMACY - ADULT TOTAL PARENTERAL NUTRITION CONSULT NOTE   Pharmacy Consult for TPN  Indication: Prolonged ileus  Patient Measurements: Height: 5\' 4"  (162.6 cm) Weight: 192 lb 6.4 oz (87.3 kg) IBW/kg (Calculated) : 54.7 TPN AdjBW (KG): 69.9 Body mass index is 33.03 kg/m.  Assessment: Ruptured AAA repair.  PMH: HTN, HLD, COPD  GI: Albumin 2.5 (2/8). NGT in place. 250 out/24h>clamped 2/15 d/t little outpt. Pt notes flatus, and loose BM. IV PPI/12hrs, docusate, lactulose, Miralax, SennaS Prealbumin = 12. 2 x BM on yesterday 2/15, feeling hungry  Endo: no h/o DM noted. cbgs have been 130-140s Insulin requirements in the past 24 hours: 12 units, unchanged  Lytes: K 3.7, Phos 2.6, Na 147, other lytes ok on 2/15  Renal: Scr 1.2, lasix IV BID - UOP 0.5 ml/kg/hr, still positive overall  Pulm: Flonase, duonebs,  Cards: Post-op afib. IV metoprolol, IV Amio TG 244 >172  Hepatobil: TG 244 > 172, Tbili 1.6, LFTs wnl 2/13  Neuro: A&O  ID: WBC 15 > 18, no active abx  TPN Access: PICC 2/13 TPN start date: 05/11/18 Nutritional Goals (per RD recommendation on  05/11/18): KCal:1800-2000 Protein: 90-105 Fluid: 1.8-2 L  Current Nutrition: TPN @ 70 ml/hr, D5NS @ 30 ml/hr  Plan:  -Continue TPN at 70 ml/hr -This TPN will provide 87 g protein, 33 g lipids, 336 g dextrose, and 1827 kcal, meeting 100% of patient needs -d/c D5NS -Continue SSI q 6 hrs  -TPN labs in AM - f/u ability to start diet  Daylene Posey, PharmD Clinical Pharmacist Please check AMION for all [facility] numbers 05/14/2018 8:01 AM

## 2018-05-14 NOTE — Progress Notes (Signed)
Pt has not worn CPAP or BIPAP in several days. Pt states she does not need or want either or them. Pt has no history of OSA or sleep apnea in her notes. RT will continue to monitor.

## 2018-05-14 NOTE — Progress Notes (Signed)
Patient ID: Leah Mata, female   DOB: 18-Jul-1945, 73 y.o.   MRN: [id number]    16 Days Post-Op  Subjective: Patient with no complaints. Moved her bowels more and still passing flatus.  No nausea with NGT clamped since yesterday.  Residual was minimal.  Objective: Vital signs in last 24 hours: Temp:  [97.3 F (36.3 C)-97.8 F (36.6 C)] 97.3 F (36.3 C) (02/16 0815) Pulse Rate:  [73-81] 81 (02/16 0815) Resp:  [21-25] 22 (02/16 0815) BP: (132-154)/(58-87) 138/61 (02/16 0815) SpO2:  [97 %-100 %] 99 % (02/16 0815) FiO2 (%):  [50 %] 50 % (02/15 1107) Weight:  [87.3 kg] 87.3 kg (02/16 0552) Last BM Date: 05/13/18  Intake/Output from previous day: 02/15 0701 - 02/16 0700 In: 2631.7 [I.V.:2631.7] Out: 1150 [Urine:1150] Intake/Output this shift: Total I/O In: -  Out: 10 [Emesis/NG output:10]  PE: Abd: still with some bloating, but softer, obese, midline incision is c/d/i with staples, some BS, appropriately tender  Lab Results:  Recent Labs    05/13/18 0417  WBC 18.6*  HGB 11.3*  HCT 34.3*  PLT 142*   BMET Recent Labs    05/12/18 0517 05/13/18 0417  NA 144 147*  K 3.5 3.7  CL 111 114*  CO2 25 24  GLUCOSE 156* 157*  BUN 28* 35*  CREATININE 1.32* 1.20*  CALCIUM 9.1 9.0   PT/INR No results for input(s): LABPROT, INR in the last 72 hours. CMP     Component Value Date/Time   NA 147 (H) 05/13/2018 0417   K 3.7 05/13/2018 0417   CL 114 (H) 05/13/2018 0417   CO2 24 05/13/2018 0417   GLUCOSE 157 (H) 05/13/2018 0417   BUN 35 (H) 05/13/2018 0417   CREATININE 1.20 (H) 05/13/2018 0417   CALCIUM 9.0 05/13/2018 0417   PROT 5.4 (L) 05/11/2018 0653   ALBUMIN 2.3 (L) 05/11/2018 0653   AST 33 05/11/2018 0653   ALT 27 05/11/2018 0653   ALKPHOS 84 05/11/2018 0653   BILITOT 1.6 (H) 05/11/2018 0653   GFRNONAA 45 (L) 05/13/2018 0417   GFRAA 53 (L) 05/13/2018 0417   Lipase  No results found for: LIPASE     Studies/Results: No results  found.  Anti-infectives: Anti-infectives (From admission, onward)   Start     Dose/Rate Route Frequency Ordered Stop   05/03/18 1815  cefTRIAXone (ROCEPHIN) 1 g in sodium chloride 0.9 % 100 mL IVPB  Status:  Discontinued     1 g 200 mL/hr over 30 Minutes Intravenous Every 24 hours 05/03/18 1728 05/09/18 1148   04/29/18 0400  ceFAZolin (ANCEF) IVPB 2g/100 mL premix     2 g 200 mL/hr over 30 Minutes Intravenous Every 8 hours 04/29/18 0044 04/29/18 1259       Assessment/Plan Current smoker COPD HTN HLD Ruptured infrarenal abdominal aortic aneurysms/p open repair 04/28/18 Dr. Tawanna Cooler Early Paroxysmal atrial fibrillation  SBO vs Ileus -did well with clamping trial.  Does not want NGT out yet -will give clear liquids with NGT in place. -mobilize  ID -none currently VTE -sq heparin FEN -IVF, CLD, TPN Foley -none Follow up -TBD   LOS: 16 days    Letha Cape , [facility] Surgery 05/14/2018, 9:29 AM Pager: [phone]

## 2018-05-15 DIAGNOSIS — I5031 Acute diastolic (congestive) heart failure: Secondary | ICD-10-CM

## 2018-05-15 LAB — CBC
HCT: 33.2 % — ABNORMAL LOW (ref 36.0–46.0)
HEMOGLOBIN: 10.8 g/dL — AB (ref 12.0–15.0)
MCH: 29.3 pg (ref 26.0–34.0)
MCHC: 32.5 g/dL (ref 30.0–36.0)
MCV: 90.2 fL (ref 80.0–100.0)
Platelets: 133 10*3/uL — ABNORMAL LOW (ref 150–400)
RBC: 3.68 MIL/uL — ABNORMAL LOW (ref 3.87–5.11)
RDW: 18.3 % — ABNORMAL HIGH (ref 11.5–15.5)
WBC: 16.5 10*3/uL — ABNORMAL HIGH (ref 4.0–10.5)
nRBC: 0 % (ref 0.0–0.2)

## 2018-05-15 LAB — PHOSPHORUS: Phosphorus: 3.2 mg/dL (ref 2.5–4.6)

## 2018-05-15 LAB — COMPREHENSIVE METABOLIC PANEL
ALBUMIN: 2 g/dL — AB (ref 3.5–5.0)
ALK PHOS: 116 U/L (ref 38–126)
ALT: 27 U/L (ref 0–44)
ANION GAP: 6 (ref 5–15)
AST: 26 U/L (ref 15–41)
BUN: 47 mg/dL — ABNORMAL HIGH (ref 8–23)
CO2: 24 mmol/L (ref 22–32)
Calcium: 8.5 mg/dL — ABNORMAL LOW (ref 8.9–10.3)
Chloride: 113 mmol/L — ABNORMAL HIGH (ref 98–111)
Creatinine, Ser: 1.3 mg/dL — ABNORMAL HIGH (ref 0.44–1.00)
GFR calc Af Amer: 48 mL/min — ABNORMAL LOW (ref >60)
GFR calc non Af Amer: 41 mL/min — ABNORMAL LOW (ref >60)
Glucose, Bld: 131 mg/dL — ABNORMAL HIGH (ref 70–99)
Potassium: 4 mmol/L (ref 3.5–5.1)
Sodium: 143 mmol/L (ref 135–145)
Total Bilirubin: 1.7 mg/dL — ABNORMAL HIGH (ref 0.3–1.2)
Total Protein: 4.9 g/dL — ABNORMAL LOW (ref 6.5–8.1)

## 2018-05-15 LAB — GLUCOSE, CAPILLARY
Glucose-Capillary: 125 mg/dL — ABNORMAL HIGH (ref 70–99)
Glucose-Capillary: 138 mg/dL — ABNORMAL HIGH (ref 70–99)
Glucose-Capillary: 139 mg/dL — ABNORMAL HIGH (ref 70–99)
Glucose-Capillary: 142 mg/dL — ABNORMAL HIGH (ref 70–99)
Glucose-Capillary: 148 mg/dL — ABNORMAL HIGH (ref 70–99)
Glucose-Capillary: 154 mg/dL — ABNORMAL HIGH (ref 70–99)

## 2018-05-15 LAB — DIFFERENTIAL
Abs Immature Granulocytes: 0.75 10*3/uL — ABNORMAL HIGH (ref 0.00–0.07)
Basophils Absolute: 0.1 10*3/uL (ref 0.0–0.1)
Basophils Relative: 1 %
Eosinophils Absolute: 0.1 10*3/uL (ref 0.0–0.5)
Eosinophils Relative: 1 %
Immature Granulocytes: 5 %
Lymphocytes Relative: 5 %
Lymphs Abs: 0.8 10*3/uL (ref 0.7–4.0)
Monocytes Absolute: 2 10*3/uL — ABNORMAL HIGH (ref 0.1–1.0)
Monocytes Relative: 12 %
NEUTROS ABS: 12.7 10*3/uL — AB (ref 1.7–7.7)
Neutrophils Relative %: 76 %

## 2018-05-15 LAB — PREALBUMIN: Prealbumin: 16.3 mg/dL — ABNORMAL LOW (ref 18–38)

## 2018-05-15 LAB — MAGNESIUM: Magnesium: 1.8 mg/dL (ref 1.7–2.4)

## 2018-05-15 LAB — TRIGLYCERIDES: Triglycerides: 131 mg/dL (ref <150)

## 2018-05-15 MED ORDER — TRAVASOL 10 % IV SOLN
INTRAVENOUS | Status: AC
  Administered 2018-05-15: 18:00:00 via INTRAVENOUS
  Filled 2018-05-15: qty 907.2

## 2018-05-15 NOTE — Progress Notes (Signed)
IP rehab admissions - I met with patient today.  She is making some progress.  She is open to SNF or to CIR once she is medically ready.  Call me for questions.  [phone]

## 2018-05-15 NOTE — Progress Notes (Signed)
Physical Therapy Treatment Patient Details Name: Leah Mata MRN: [id number] DOB: 1946/02/19 Today's Date: 05/15/2018    History of Present Illness Pt is a 73 y.o. F with significant PMH of COPD who presents with abdominal and right flank pain. 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 S/p aneurysm abdominal aortic repair. Hospitalization complicated by worsening SOB requiring bipap on 2/4 due to pulmonary effusion, tachycardia 2/5 requiring amiodorone drip.    PT Comments    Patient seen for activity progression. Tolerated ambulation in halls and functional tasks/hygiene care. Increased time and effort for mobility. Ambulated on 3 liters with saturations to 88%, improved with standing rest break and increased time. Current POC remains appropriate.  Continue to feel CIR will be beneficial to improve functional status and overall activity tolerance.  Follow Up Recommendations  CIR     Equipment Recommendations  Rolling walker with 5" wheels;3in1 (PT)    Recommendations for Other Services       Precautions / Restrictions Precautions Precautions: Fall Precaution Comments: NG tube Restrictions Weight Bearing Restrictions: No    Mobility  Bed Mobility Overal bed mobility: Needs Assistance Bed Mobility: Rolling;Sidelying to Sit Rolling: Min assist Sidelying to sit: Min assist       General bed mobility comments: min assist from posterior support to come to upright at EOB, increased time and effort  Transfers Overall transfer level: Needs assistance Equipment used: 4-wheeled walker Transfers: Sit to/from Stand Sit to Stand: Min assist         General transfer comment: Min assist for stability with Vcs for hand placement and use of 4 wheeled Rollator  Ambulation/Gait Ambulation/Gait assistance: Min assist(2nd person chair follow) Gait Distance (Feet): 70 Feet(one standing rest break then return 70  ft) Assistive device: 4-wheeled walker Gait Pattern/deviations: Step-through pattern;Decreased stride length;Wide base of support Gait velocity: decreased Gait velocity interpretation: <1.8 ft/sec, indicate of risk for recurrent falls General Gait Details: Ambulated with 4 wheeled rollator, increased distance with shorter rest break on 3 liters [place] with saturations to 88%, improved with nasal inhalation and 1 minute rest   Stairs             Wheelchair Mobility    Modified Rankin (Stroke Patients Only)       Balance Overall balance assessment: Needs assistance Sitting-balance support: Feet supported Sitting balance-[name]: Good     Standing balance support: Bilateral upper extremity supported;During functional activity Standing balance-[name]: Poor Standing balance comment: reliance on UE support during functional task and hygiene                            Cognition Arousal/Alertness: Awake/alert Behavior During Therapy: WFL for tasks assessed/performed Overall Cognitive Status: Within Functional Limits for tasks assessed                                        Exercises      General Comments General comments (skin integrity, edema, etc.): ambulated on 3 liters with saturations to 88%      Pertinent Vitals/Pain Pain Assessment: No/denies pain    Home Living                      Prior Function            PT Goals (  current goals can now be found in the care plan section) Acute Rehab PT Goals Patient Stated Goal: to get home PT Goal Formulation: With patient Time For Goal Achievement: 05/26/18 Potential to Achieve Goals: Good Progress towards PT goals: Progressing toward goals    Frequency    Min 3X/week      PT Plan Current plan remains appropriate    Co-evaluation              AM-PAC PT "6 Clicks" Mobility   Outcome Measure  Help needed turning from your back to your side while in a flat  bed without using bedrails?: A Little Help needed moving from lying on your back to sitting on the side of a flat bed without using bedrails?: A Little Help needed moving to and from a bed to a chair (including a wheelchair)?: A Little Help needed standing up from a chair using your arms (e.g., wheelchair or bedside chair)?: A Little Help needed to walk in hospital room?: A Lot Help needed climbing 3-5 steps with a railing? : Total 6 Click Score: 15    End of Session Equipment Utilized During Treatment: Oxygen Activity Tolerance: Patient tolerated treatment well Patient left: with call bell/phone within reach;with family/visitor present;in bed(sitting At EOB with meal tray and family) Nurse Communication: Mobility status PT Visit Diagnosis: Unsteadiness on feet (R26.81);Muscle weakness (generalized) (M62.81)     Time: 4695-0722 PT Time Calculation (min) (ACUTE ONLY): 18 min  Charges:  $Gait Training: 8-22 mins                     Charlotte Crumb, PT DPT  Board Certified Neurologic Specialist Acute Rehabilitation Services Pager [phone] Office [phone]    Fabio Asa 05/15/2018, 5:01 PM

## 2018-05-15 NOTE — Care Management Important Message (Signed)
Important Message  Patient Details  Name: Leah Mata MRN: [id number] Date of Birth: 05-13-1945   Medicare Important Message Given:  Yes    Akai Dollard P Costas Sena 05/15/2018, 4:23 PM

## 2018-05-15 NOTE — Progress Notes (Signed)
  Speech Language Pathology Treatment: Dysphagia  Patient Details Name: Leah Mata MRN: [id number] DOB: [birth year] Today's Date: 05/15/2018 Time: 9937-1696 SLP Time Calculation (min) (ACUTE ONLY): 8 min  Assessment / Plan / Recommendation Clinical Impression  Pt was seen for diet tolerance as diet has now been restarted by MD (currently on full liquid diet). Pt has a baseline, intermittent, subtle cough that she and family agree has been present since PTA. They also deny any increase in coughing during PO intake. Occasional throat clearing/weak cough is observed throughout PO intake but not immediately following sips of liquid. She remains afebrile. SLP reviewed aspiration and esophageal precautions. Recommend continuing current diet with these precautions. SLP to follow briefly as diet continues to advance.   HPI HPI: Pt is a 73 yo female who presented with abdominal and right flank pain. 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 S/p aneurysm abdominal aortic repair with post-op ileus. ETT 1/31-2/1. PMH: COPD, HTN, HLD      SLP Plan  Continue with current plan of care       Recommendations  Diet recommendations: Thin liquid(full liquid diet per MD) Liquids provided via: Cup;Straw Medication Administration: Whole meds with puree Supervision: Patient able to self feed;Intermittent supervision to cue for compensatory strategies Compensations: Slow rate;Small sips/bites;Follow solids with liquid Postural Changes and/or Swallow Maneuvers: Seated upright 90 degrees;Upright 30-60 min after meal                Oral Care Recommendations: Oral care BID Follow up Recommendations: Inpatient Rehab SLP Visit Diagnosis: Dysphagia, unspecified (R13.10) Plan: Continue with current plan of care       GO                Skip Mayer 05/15/2018, 3:33 PM  Maxcine Ham Syniah Berne, M.A. CCC-SLP Acute Herbalist  [phone] Office [phone]

## 2018-05-15 NOTE — Progress Notes (Signed)
Progress Note  Patient Name: Leah Mata Date of Encounter: 05/15/2018  Primary Cardiologist: Kristeen Miss, MD   Subjective   Still with dyspnea, no pain.   Inpatient Medications    Scheduled Meds: . chlorhexidine  15 mL Mouth Rinse BID  . sennosides  10 mL Per Tube QHS   And  . docusate  100 mg Per Tube QHS  . fluticasone  2 spray Each Nare Daily  . furosemide  80 mg Intravenous BID  . heparin  5,000 Units Subcutaneous Q8H  . hydrALAZINE  15 mg Intravenous Q6H  . insulin aspart  0-15 Units Subcutaneous Q4H  . ipratropium-albuterol  3 mL Nebulization TID  . labetalol  10 mg Intravenous Q6H  . mouth rinse  15 mL Mouth Rinse q12n4p  . pantoprazole (PROTONIX) IV  40 mg Intravenous Q12H  . sodium chloride flush  10-40 mL Intracatheter Q12H  . sodium phosphate  1 enema Rectal Once   Continuous Infusions: . sodium chloride 250 mL (05/14/18 1211)  . magnesium sulfate 1 - 4 g bolus IVPB    . TPN ADULT (ION) 70 mL/hr at 05/14/18 1735  . TPN ADULT (ION)     PRN Meds: sodium chloride, alum & mag hydroxide-simeth, bisacodyl, guaiFENesin-dextromethorphan, hydrALAZINE, levalbuterol, magnesium sulfate 1 - 4 g bolus IVPB, morphine injection, ondansetron, oxyCODONE-acetaminophen, phenol, sodium chloride flush, sodium chloride flush   Vital Signs    Vitals:   05/15/18 0441 05/15/18 0500 05/15/18 0808 05/15/18 0816  BP: (!) 137/56  (!) 125/58   Pulse: 84  76   Resp: (!) 25  20   Temp: 97.8 F (36.6 C)  97.6 F (36.4 C)   TempSrc: Oral  Oral   SpO2: 94%  96% 98%  Weight:  89.8 kg    Height:        Intake/Output Summary (Last 24 hours) at 05/15/2018 0948 Last data filed at 05/15/2018 0802 Gross per 24 hour  Intake 1477.02 ml  Output 1060 ml  Net 417.02 ml   Last 3 Weights 05/15/2018 05/14/2018 05/13/2018  Weight (lbs) 197 lb 15.6 oz 192 lb 6.4 oz 195 lb 8.8 oz  Weight (kg) 89.8 kg 87.272 kg 88.7 kg      Telemetry    sinus - Personally Reviewed  ECG    n/a -  Personally Reviewed  Physical Exam   VS:  BP (!) 125/58 (BP Location: Right Leg)   Pulse 76   Temp 97.6 F (36.4 C) (Oral)   Resp 20   Ht 5\' 4"  (1.626 m)   Wt 89.8 kg   LMP 03/29/1993 (Approximate)   SpO2 98%   BMI 33.98 kg/m  , BMI Body mass index is 33.98 kg/m.  General: Well developed, well nourished, ill appearing HEENT: OP clear, mucus membranes moist  SKIN: warm, dry. No rashes. Neuro: No focal deficits  Musculoskeletal: Muscle strength 5/5 all ext  Psychiatric: Mood and affect normal  Neck: + JVD, no carotid bruits, no thyromegaly, no lymphadenopathy.  Lungs:Clear bilaterally, no wheezes, rhonci, crackles Cardiovascular: Regular rate and rhythm. No murmurs, gallops or rubs. Abdomen:Soft. Bowel sounds present. Non-tender.  Extremities: 2+ bilateral lower extremity edema.     Labs    Chemistry Recent Labs  Lab 05/11/18 [phone] 05/12/18 0517 05/13/18 0417 05/15/18 0657  NA 145 144 147* 143  K 3.4* 3.5 3.7 4.0  CL 112* 111 114* 113*  CO2 25 25 24 24   GLUCOSE 135* 156* 157* 131*  BUN 25* 28* 35* 47*  CREATININE 1.11* 1.32* 1.20* 1.30*  CALCIUM 9.2 9.1 9.0 8.5*  PROT 5.4*  --   --  4.9*  ALBUMIN 2.3*  --   --  2.0*  AST 33  --   --  26  ALT 27  --   --  27  ALKPHOS 84  --   --  116  BILITOT 1.6*  --   --  1.7*  GFRNONAA 50* 40* 45* 41*  GFRAA 58* 47* 53* 48*  ANIONGAP 8 8 9 6      Hematology Recent Labs  Lab 05/10/18 0320 05/11/18 0653 05/13/18 0417  WBC 15.9* 18.2* 18.6*  RBC 4.24 4.19 3.92  HGB 12.5 12.1 11.3*  HCT 36.9 36.2 34.3*  MCV 87.0 86.4 87.5  MCH 29.5 28.9 28.8  MCHC 33.9 33.4 32.9  RDW 17.2* 17.4* 18.3*  PLT 215 234 142*    Cardiac EnzymesNo results for input(s): TROPONINI in the last 168 hours. No results for input(s): TROPIPOC in the last 168 hours.   BNP No results for input(s): BNP, PROBNP in the last 168 hours.   DDimer No results for input(s): DDIMER in the last 168 hours.   Radiology    No results found.  Cardiac  Studies   Echo 05/03/18: IMPRESSIONS   1. The left ventricle has hyperdynamic systolic function of >65%. The cavity size is normal. There is no increased left ventricular wall thickness. Echo evidence of impaired diastolic relaxation.  2. The right ventricle has normal systolic function. The cavity in normal in size. There is no increase in right ventricular wall thickness.  3. The mitral valve is normal in structure.  4. The aortic valve has an indeterminant number of cusps.  5. The pulmonic valve is Not well seen. Pulmonic valve regurgitation was not assessed by color flow Doppler.  6. There is mild dilatation of the aortic root.  7. The interatrial septum was not assessed.  8. Extremely limited due to poor sound wave transmission; definity used; vigorous LV systolic function; mild diastolic dysfunction.  Patient Profile     73 y.o. female  with history of hypertension, hyperlipidemia, and COPD who developed post operative atrial fibrillation and volume overload after repair of a ruptured AAA.  Assessment & Plan    1. Post-operative atrial fibrillation: Sinus this am. Will not plan anti-coagulation unless she has recurrence of atrial fibrillation.   2. Hypertension: BP is well controlled. She is on IV labetalol and hydralazine while NPO. Can resume home anti-hypertensive medications when taking po.   3. Volume overload: Still with evidence of volume overload on exam and positive fluid balance. Continue IV Lasix today.     For questions or updates, please contact [facility] Please consult [email or web address] for contact info under        Signed, Verne Carrow, MD  05/15/2018, 9:48 AM

## 2018-05-15 NOTE — Progress Notes (Addendum)
  Progress Note    05/15/2018 7:45 AM 17 Days Post-Op  Subjective:  Unable to draw blood-eventually got it from picc line.  Still a little sob with activity  Afebrile HR 60's-80's NSR 130's-140's systolic 96% 2LO2NC  Vitals:   05/15/18 0118 05/15/18 0441  BP: (!) 137/58 (!) 137/56  Pulse:  84  Resp:  (!) 25  Temp:  97.8 F (36.6 C)  SpO2:  94%    Physical Exam: Cardiac:  regular Lungs:  Non labored Incisions:  Clean and dry with staples in tact Extremities:  Bilateral feet are warm and well perfused. Abdomen:  Soft, NT/ND; slightly distended.   CBC    Component Value Date/Time   WBC 18.6 (H) 05/13/2018 0417   RBC 3.92 05/13/2018 0417   HGB 11.3 (L) 05/13/2018 0417   HCT 34.3 (L) 05/13/2018 0417   PLT 142 (L) 05/13/2018 0417   MCV 87.5 05/13/2018 0417   MCH 28.8 05/13/2018 0417   MCHC 32.9 05/13/2018 0417   RDW 18.3 (H) 05/13/2018 0417   LYMPHSABS 0.9 05/11/2018 0653   MONOABS 2.0 (H) 05/11/2018 0653   EOSABS 0.0 05/11/2018 0653   BASOSABS 0.1 05/11/2018 0653    BMET    Component Value Date/Time   NA 143 05/15/2018 0657   K 4.0 05/15/2018 0657   CL 113 (H) 05/15/2018 0657   CO2 24 05/15/2018 0657   GLUCOSE 131 (H) 05/15/2018 0657   BUN 47 (H) 05/15/2018 0657   CREATININE 1.30 (H) 05/15/2018 0657   CALCIUM 8.5 (L) 05/15/2018 0657   GFRNONAA 41 (L) 05/15/2018 0657   GFRAA 48 (L) 05/15/2018 0657    INR    Component Value Date/Time   INR 1.16 04/30/2018 0413     Intake/Output Summary (Last 24 hours) at 05/15/2018 0745 Last data filed at 05/15/2018 0524 Gross per 24 hour  Intake 1357.02 ml  Output 1070 ml  Net 287.02 ml     Assessment:  73 y.o. female is s/p:  Open repair of ruptured infrarenal abdominal aortic aneurysm  17 Days Post-Op  Plan: -pt doing well this morning-continues to have BM and flatus.  Tolerated clears this morning.  Anticipate general surgery will take NGT out today and advance diet.   Continue tpn for now for  nutrition. -pt still with SOB - discussed with Dr. Arbie Cookey, and will continue inhaler and nebulizer, mobilization. -creatinine stable.  CBC pending -DVT prophylaxis:  Sq heparin   Doreatha Massed, PA-C [facility] [phone] 05/15/2018 7:45 AM  I have examined the patient, reviewed and agree with above.  Comfortable today.  Abdomen completely benign.  Seeing flatus and some bowel movements.  No nausea.  Agree with NG out today per general surgery advance diet as tolerated  Gretta Began, MD 05/15/2018 8:51 AM

## 2018-05-15 NOTE — Progress Notes (Signed)
PHARMACY - ADULT TOTAL PARENTERAL NUTRITION CONSULT NOTE   Pharmacy Consult:  TPN  Indication: Prolonged ileus  Patient Measurements: Height: 5\' 4"  (162.6 cm) Weight: 197 lb 15.6 oz (89.8 kg) IBW/kg (Calculated) : 54.7 TPN AdjBW (KG): 69.9 Body mass index is 33.98 kg/m.  Assessment:  84 YOF presented to [facility] with abdominal and right flank pain.  CT showed large infrarenal aneurysm with contained rupture in right retroperitoneal space.  Patient transferred to [facility] and underwent repair on 04/29/18.  Post-op, patient's diet advanced to dysphagia 2 and then developed abdominal distention with nausea and emesis on 05/06/18.  Pharmacy consulted to manage TPN.  GI: ileus.  Prealbumin improved to 16.3, NG O/P 7mL, +BM and flatus.  Docusate, Senokot-S, IV Protonix Endo: no hx DM - CBGs controlled Insulin requirements in the past 24 hours: 13 units Lytes: CL elevated, others WNL (Mag low normal) Renal: SCr up to 1.3 with Lasix, BUN 47 - UOP 0.5 ml/kg/hr Pulm: COPD on 3L [place] - Flonase, Duonebs Cards: HTN/HLD, post-op Afib.  VSS - Lasix 80mg  IV BID started 2/15, hydralazine, IV labetalol Hepatobil: LFTs WNL, tbili 1.7 (no jaundice), TG WNL Neuro: A&O ID: afebrile, WBC 18.6 - not on abx TPN Access: PICC 05/11/18 TPN start date: 05/11/18  Nutritional Goals (per RD rec on  05/11/18): 1800-2000 kCal, 90-105gm protein, 1.8-2 L fluid per day  Current Nutrition: TPN Advance to full liquid diet today  Plan:  Continue TPN at 70 ml/hr, providing 91g AA, 336g CHO and 34g ILE for a total of 1841 kCal, meeting 100% of patient's needs Electrolytes in TPN: increase Mag, reduce Na, change Cl:Ac to 1:2 Continue SSI Q6H F/U PO intake/diet advancement to wean TPN tomorrow  Patient has 2 docusate orders.  Will discontinue one.  MD, please increase dose if that was your intention.   Dvante Hands D. Laney Potash, PharmD, BCPS, BCCCP 05/15/2018, 9:11 AM

## 2018-05-15 NOTE — Progress Notes (Signed)
17 Days Post-Op   Subjective/Chief Complaint: PT doing well Ambulating BMs and flatus   Objective: Vital signs in last 24 hours: Temp:  [97.3 F (36.3 C)-97.8 F (36.6 C)] 97.6 F (36.4 C) (02/17 0808) Pulse Rate:  [72-91] 76 (02/17 0808) Resp:  [20-27] 20 (02/17 0808) BP: (125-169)/(56-111) 125/58 (02/17 0808) SpO2:  [94 %-99 %] 98 % (02/17 0816) Weight:  [89.8 kg] 89.8 kg (02/17 0500) Last BM Date: 05/14/18  Intake/Output from previous day: 02/16 0701 - 02/17 0700 In: 1357 [P.O.:240; I.V.:1117] Out: 1070 [Urine:1050; Emesis/NG output:20] Intake/Output this shift: Total I/O In: 120 [P.O.:120] Out: -   Constitutional: No acute distress, conversant, appears states age. Eyes: Anicteric sclerae, moist conjunctiva, no lid lag Lungs: Clear to auscultation bilaterally, normal respiratory effort CV: regular rate and rhythm, no murmurs, no peripheral edema, pedal pulses 2+ GI: Soft, no masses or hepatosplenomegaly, non-tender to palpation, inc c/d/i Skin: No rashes, palpation reveals normal turgor Psychiatric: appropriate judgment and insight, oriented to person, place, and time    Lab Results:  Recent Labs    05/13/18 0417  WBC 18.6*  HGB 11.3*  HCT 34.3*  PLT 142*   BMET Recent Labs    05/13/18 0417 05/15/18 0657  NA 147* 143  K 3.7 4.0  CL 114* 113*  CO2 24 24  GLUCOSE 157* 131*  BUN 35* 47*  CREATININE 1.20* 1.30*  CALCIUM 9.0 8.5*   PT/INR No results for input(s): LABPROT, INR in the last 72 hours. ABG No results for input(s): PHART, HCO3 in the last 72 hours.  Invalid input(s): PCO2, PO2  Studies/Results: No results found.  Anti-infectives: Anti-infectives (From admission, onward)   Start     Dose/Rate Route Frequency Ordered Stop   05/03/18 1815  cefTRIAXone (ROCEPHIN) 1 g in sodium chloride 0.9 % 100 mL IVPB  Status:  Discontinued     1 g 200 mL/hr over 30 Minutes Intravenous Every 24 hours 05/03/18 1728 05/09/18 1148   04/29/18 0400   ceFAZolin (ANCEF) IVPB 2g/100 mL premix     2 g 200 mL/hr over 30 Minutes Intravenous Every 8 hours 04/29/18 0044 04/29/18 1259      Assessment/Plan:  Current smoker COPD HTN HLD Ruptured infrarenal abdominal aortic aneurysms/p open repair 04/28/18 Dr. Tawanna Cooler Early Paroxysmal atrial fibrillation  SBO vs Ileus -DC NGT -adv to Fulls  ID -none currently VTE -sq heparin FEN -IVF, FLD, If tol FLD today, likely OK to wean TNA off tomorrow Foley -none Follow up -TBD  Axel Filler 05/15/2018

## 2018-05-16 LAB — CBC
HCT: 31.2 % — ABNORMAL LOW (ref 36.0–46.0)
Hemoglobin: 10.1 g/dL — ABNORMAL LOW (ref 12.0–15.0)
MCH: 29.4 pg (ref 26.0–34.0)
MCHC: 32.4 g/dL (ref 30.0–36.0)
MCV: 91 fL (ref 80.0–100.0)
Platelets: 132 10*3/uL — ABNORMAL LOW (ref 150–400)
RBC: 3.43 MIL/uL — ABNORMAL LOW (ref 3.87–5.11)
RDW: 18.3 % — ABNORMAL HIGH (ref 11.5–15.5)
WBC: 12.8 10*3/uL — ABNORMAL HIGH (ref 4.0–10.5)
nRBC: 0 % (ref 0.0–0.2)

## 2018-05-16 LAB — GLUCOSE, CAPILLARY
Glucose-Capillary: 100 mg/dL — ABNORMAL HIGH (ref 70–99)
Glucose-Capillary: 128 mg/dL — ABNORMAL HIGH (ref 70–99)
Glucose-Capillary: 136 mg/dL — ABNORMAL HIGH (ref 70–99)
Glucose-Capillary: 138 mg/dL — ABNORMAL HIGH (ref 70–99)
Glucose-Capillary: 143 mg/dL — ABNORMAL HIGH (ref 70–99)
Glucose-Capillary: 94 mg/dL (ref 70–99)

## 2018-05-16 MED ORDER — FUROSEMIDE 10 MG/ML IJ SOLN
80.0000 mg | Freq: Three times a day (TID) | INTRAMUSCULAR | Status: DC
  Administered 2018-05-16 – 2018-05-29 (×40): 80 mg via INTRAVENOUS
  Filled 2018-05-16 (×40): qty 8

## 2018-05-16 MED ORDER — ENSURE ENLIVE PO LIQD
237.0000 mL | Freq: Every day | ORAL | Status: DC
  Administered 2018-05-16 – 2018-05-20 (×4): 237 mL via ORAL

## 2018-05-16 MED ORDER — METOLAZONE 5 MG PO TABS
5.0000 mg | ORAL_TABLET | Freq: Once | ORAL | Status: AC
  Administered 2018-05-16: 5 mg via ORAL
  Filled 2018-05-16: qty 1

## 2018-05-16 MED ORDER — PANTOPRAZOLE SODIUM 40 MG PO TBEC
40.0000 mg | DELAYED_RELEASE_TABLET | Freq: Two times a day (BID) | ORAL | Status: DC
  Administered 2018-05-16 – 2018-05-30 (×23): 40 mg via ORAL
  Filled 2018-05-16 (×23): qty 1

## 2018-05-16 NOTE — Progress Notes (Signed)
18 Days Post-Op   Subjective/Chief Complaint: Pt doing well with FLD and con't having BMs and flatus   Objective: Vital signs in last 24 hours: Temp:  [97.8 F (36.6 C)-98.5 F (36.9 C)] 98.5 F (36.9 C) (02/18 0419) Pulse Rate:  [68-79] 68 (02/18 0700) Resp:  [19-22] 19 (02/18 0700) BP: (118-141)/(53-64) 126/59 (02/18 0700) SpO2:  [96 %-98 %] 96 % (02/18 0814) Weight:  [93.4 kg] 93.4 kg (02/18 0651) Last BM Date: 05/15/18  Intake/Output from previous day: 02/17 0701 - 02/18 0700 In: 1178.4 [P.O.:480; I.V.:698.4] Out: 950 [Urine:950] Intake/Output this shift: No intake/output data recorded.  Constitutional: No acute distress, conversant, appears states age. Eyes: Anicteric sclerae, moist conjunctiva, no lid lag Lungs: Clear to auscultation bilaterally, normal respiratory effort CV: regular rate and rhythm, no murmurs, no peripheral edema, pedal pulses 2+ GI: Soft, no masses or hepatosplenomegaly, non-tender to palpation Skin: No rashes, palpation reveals normal turgor Psychiatric: appropriate judgment and insight, oriented to person, place, and time    Lab Results:  Recent Labs    05/15/18 0657 05/16/18 0427  WBC 16.5* 12.8*  HGB 10.8* 10.1*  HCT 33.2* 31.2*  PLT 133* 132*   BMET Recent Labs    05/15/18 0657  NA 143  K 4.0  CL 113*  CO2 24  GLUCOSE 131*  BUN 47*  CREATININE 1.30*  CALCIUM 8.5*   PT/INR No results for input(s): LABPROT, INR in the last 72 hours. ABG No results for input(s): PHART, HCO3 in the last 72 hours.  Invalid input(s): PCO2, PO2  Studies/Results: No results found.  Anti-infectives: Anti-infectives (From admission, onward)   Start     Dose/Rate Route Frequency Ordered Stop   05/03/18 1815  cefTRIAXone (ROCEPHIN) 1 g in sodium chloride 0.9 % 100 mL IVPB  Status:  Discontinued     1 g 200 mL/hr over 30 Minutes Intravenous Every 24 hours 05/03/18 1728 05/09/18 1148   04/29/18 0400  ceFAZolin (ANCEF) IVPB 2g/100 mL premix      2 g 200 mL/hr over 30 Minutes Intravenous Every 8 hours 04/29/18 0044 04/29/18 1259      Assessment/Plan: Current smoker COPD HTN HLD Ruptured infrarenal abdominal aortic aneurysms/p open repair 04/28/18 Dr. Tawanna Cooler Early Paroxysmal atrial fibrillation  SBO vs Ileus -adv to soft diet and adv as tol -ileus appears resolved -Will be available for any questions.  ID -none currently VTE -sq heparin FEN -OK to wean TNA off today Foley -none Follow up -TBD    LOS: 18 days    Leah Mata 05/16/2018

## 2018-05-16 NOTE — Progress Notes (Signed)
Progress Note  Patient Name: Leah Mata Date of Encounter: 05/16/2018  Primary Cardiologist: Kristeen Miss, MD   Subjective   Denies chest pain or shortness of breath.  Complains of "fluid all over."  Inpatient Medications    Scheduled Meds: . chlorhexidine  15 mL Mouth Rinse BID  . sennosides  10 mL Per Tube QHS   And  . docusate  100 mg Per Tube QHS  . fluticasone  2 spray Each Nare Daily  . furosemide  80 mg Intravenous BID  . heparin  5,000 Units Subcutaneous Q8H  . hydrALAZINE  15 mg Intravenous Q6H  . insulin aspart  0-15 Units Subcutaneous Q4H  . ipratropium-albuterol  3 mL Nebulization TID  . labetalol  10 mg Intravenous Q6H  . mouth rinse  15 mL Mouth Rinse q12n4p  . pantoprazole (PROTONIX) IV  40 mg Intravenous Q12H  . sodium chloride flush  10-40 mL Intracatheter Q12H  . sodium phosphate  1 enema Rectal Once   Continuous Infusions: . sodium chloride 250 mL (05/14/18 1211)  . magnesium sulfate 1 - 4 g bolus IVPB    . TPN ADULT (ION) 70 mL/hr at 05/16/18 0346   PRN Meds: sodium chloride, alum & mag hydroxide-simeth, bisacodyl, guaiFENesin-dextromethorphan, hydrALAZINE, levalbuterol, magnesium sulfate 1 - 4 g bolus IVPB, morphine injection, ondansetron, oxyCODONE-acetaminophen, phenol, sodium chloride flush, sodium chloride flush   Vital Signs    Vitals:   05/16/18 0419 05/16/18 0651 05/16/18 0700 05/16/18 0814  BP: (!) 121/53  (!) 126/59   Pulse: 73  68   Resp: (!) 22  19   Temp: 98.5 F (36.9 C)     TempSrc: Oral     SpO2: 98%  97% 96%  Weight:  93.4 kg    Height:        Intake/Output Summary (Last 24 hours) at 05/16/2018 0906 Last data filed at 05/16/2018 0700 Gross per 24 hour  Intake 1058.39 ml  Output 950 ml  Net 108.39 ml   Last 3 Weights 05/16/2018 05/15/2018 05/14/2018  Weight (lbs) 205 lb 14.6 oz 197 lb 15.6 oz 192 lb 6.4 oz  Weight (kg) 93.4 kg 89.8 kg 87.272 kg      Telemetry    Normal sinus rhythm, no atrial fib last 24 hours -  Personally Reviewed   Physical Exam  Alert, oriented woman in no distress GEN: No acute distress.   Neck:  JVP elevated Cardiac: RRR, 2/6 systolic ejection murmur at the right upper sternal border Respiratory: Clear to auscultation bilaterally. GI: Soft, nontender, distended, incision appears to be healing well MS:  3+ diffuse lower extremity edema; No deformity. Neuro:  Nonfocal  Psych: Normal affect   Labs    Chemistry Recent Labs  Lab 05/11/18 0653 05/12/18 0517 05/13/18 0417 05/15/18 0657  NA 145 144 147* 143  K 3.4* 3.5 3.7 4.0  CL 112* 111 114* 113*  CO2 25 25 24 24   GLUCOSE 135* 156* 157* 131*  BUN 25* 28* 35* 47*  CREATININE 1.11* 1.32* 1.20* 1.30*  CALCIUM 9.2 9.1 9.0 8.5*  PROT 5.4*  --   --  4.9*  ALBUMIN 2.3*  --   --  2.0*  AST 33  --   --  26  ALT 27  --   --  27  ALKPHOS 84  --   --  116  BILITOT 1.6*  --   --  1.7*  GFRNONAA 50* 40* 45* 41*  GFRAA 58* 47* 53* 48*  ANIONGAP 8 8 9 6      Hematology Recent Labs  Lab 05/13/18 0417 05/15/18 0657 05/16/18 0427  WBC 18.6* 16.5* 12.8*  RBC 3.92 3.68* 3.43*  HGB 11.3* 10.8* 10.1*  HCT 34.3* 33.2* 31.2*  MCV 87.5 90.2 91.0  MCH 28.8 29.3 29.4  MCHC 32.9 32.5 32.4  RDW 18.3* 18.3* 18.3*  PLT 142* 133* 132*    Cardiac EnzymesNo results for input(s): TROPONINI in the last 168 hours. No results for input(s): TROPIPOC in the last 168 hours.   BNPNo results for input(s): BNP, PROBNP in the last 168 hours.   DDimer No results for input(s): DDIMER in the last 168 hours.   Radiology    No results found.   Patient Profile     73 y.o. female status post open AAA repair with postoperative atrial fibrillation and marked volume overload  Assessment & Plan    1.  Postoperative atrial fibrillation: Telemetry reviewed, maintaining sinus rhythm.  Continue to monitor.  No anticoagulation planned.  2.  Acute diastolic heart failure/postoperative volume overload: Patient has been receiving furosemide 80  mg IV twice daily with continued evidence of marked volume overload.  Will increase dosing to 3 times daily and will give a single dose of metolazone today to try to mobilize fluid.  Repeat metabolic panel in the morning.  3.  Hypertension: Currently managed with IV hydralazine and labetalol.  Plan to switch to oral antihypertensives when she is consistently taking p.o.    For questions or updates, please contact [facility] Please consult [email or web address] for contact info under       Signed, Tonny Bollman, MD  05/16/2018, 9:06 AM

## 2018-05-16 NOTE — Progress Notes (Signed)
Called to room d/t Pt family concern for swelling in bilat extremities. Noted in notes and assessment. Pt has 2+ pitting edema in all extremities. She receives 80mg  IV lasix BID. I explained she got both doses today per her MAR. Pt not c/o of discomfort or SOB, no wheezing present. Verified with charge nurse, that does agree to edema but no apparent distress. Paged the MD on call cardiology about the concern. NO further orders at this time

## 2018-05-16 NOTE — Progress Notes (Signed)
IP rehab admissions - I have opened the case with [facility] medicare today requesting acute inpatient rehab admission.  I will update all once I hear back from insurance case manager.  Call me for questions.  [phone]

## 2018-05-16 NOTE — Progress Notes (Signed)
Subjective: Interval History: none.. Comfortable this morning.  Continuing with flatus and bowel movements.  No nausea.  Objective: Vital signs in last 24 hours: Temp:  [97.6 F (36.4 C)-98.5 F (36.9 C)] 98.5 F (36.9 C) (02/18 0419) Pulse Rate:  [68-79] 68 (02/18 0700) Resp:  [19-22] 19 (02/18 0700) BP: (118-141)/(53-64) 126/59 (02/18 0700) SpO2:  [96 %-98 %] 97 % (02/18 0700) Weight:  [93.4 kg] 93.4 kg (02/18 0651)  Intake/Output from previous day: 02/17 0701 - 02/18 0700 In: 1178.4 [P.O.:480; I.V.:698.4] Out: 950 [Urine:950] Intake/Output this shift: No intake/output data recorded.  Abdomen soft and nontender.  Respirations non-labored.  Pitting edema in arms and legs unchanged  Lab Results: Recent Labs    05/15/18 0657 05/16/18 0427  WBC 16.5* 12.8*  HGB 10.8* 10.1*  HCT 33.2* 31.2*  PLT 133* 132*   BMET Recent Labs    05/15/18 0657  NA 143  K 4.0  CL 113*  CO2 24  GLUCOSE 131*  BUN 47*  CREATININE 1.30*  CALCIUM 8.5*    Studies/Results: Ct Abdomen Pelvis Wo Contrast  Result Date: 05/07/2018 CLINICAL DATA:  Abdominal distention. Bowel obstruction. Abdominal aortic aneurysm repair on 04/28/2018 EXAM: CT ABDOMEN AND PELVIS WITHOUT CONTRAST TECHNIQUE: Multidetector CT imaging of the abdomen and pelvis was performed following the standard protocol without IV contrast. COMPARISON:  04/28/2018 CT scan FINDINGS: Lower chest: Small bilateral pleural effusions with passive atelectasis. There some additional airspace opacity in the right lower lobe which may represent additional atelectasis or pneumonia. Right coronary artery atherosclerotic calcification. Hepatobiliary: Stable small hypodense hepatic lesions, likely cysts or similar benign structures. Contrast medium gallbladder probably from vicarious excretion. No biliary dilatation. Pancreas: Stable atrophic appearance of the pancreas. Spleen: Unremarkable Adrenals/Urinary Tract: Both adrenal glands appear within  normal limits. Modest increase in size of the right perirenal and retroperitoneal hematoma, current estimate at about 8.9 by 10.0 by 17.9 cm (volume = 830 cm^3), formerly about 8.7 by 5.6 by 15.7 cm (volume = 400 cm^3). This displaces the right kidney anteriorly. The right ureter is obscured adjacent to the hematoma. There is some mild fullness of the right collecting system but no overt hydronephrosis. Stomach/Bowel: Nasogastric tube in place. Distended small bowel the with air-fluid levels noted, measuring up to 3.8 cm, extending down to a abrupt transition just to the right of midline on image 52/3 where the dilated bowel terminates in and nondilated loops. The distal small bowel loops are nondilated and the colon is likewise not dilated. The appearance is compatible with small bowel obstruction and presumably there is some type of twist or adhesion in the vicinity of the transition no pneumatosis. No swirling mesenteric vessels are observed. Vascular/Lymphatic: Retroperitoneal hematoma. Interval abdominal aortic aneurysm repair is not characterized in detail on today's noncontrast exam. There appears to be a stent graft within the original aneurysm with residual mural thrombus and marginal calcification along the original aneurysm site. Posterior to the common iliac branches of the bifurcated stent graft there is some high density material within the lumen of the native abdominal aorta, probably representing incidental mixed density blood products, difficult to exclude a true endoleak without contrast. Mixed density fluid tracks around the right common iliac artery, favoring hematoma and increased from 04/28/2018, with a higher density posterior component. Reproductive: Unremarkable Other: Pelvic ascites, probably complex, pneumoperitoneum not excluded. Increased presacral soft tissue density/ascites. Increased bilateral flank edema in the subcutaneous tissues. Indirect left inguinal hernia containing adipose  tissue and complex ascites. Edema tracks along the  upper thigh musculature diffusely. Musculoskeletal: Multilevel degenerative facet arthropathy in the lumbar spine. IMPRESSION: 1. Small-bowel obstruction with a transition point in the proximal ileum shown on image 52/3 where there is a slight angulation of a loop of bowel suggesting an adhesion. This angulation is fairly gentle. Small bowel loops proximal to the transition measure up to 3.8 cm in diameter. 2. There is some increase in volume of the right retroperitoneal and perirenal hematoma, previous volume estimate about 400 cubic cm, currently about twice that period 3. Postoperative findings along the aneurysm. I can not exclude an endoleak based on high density components of the thrombosed native aneurysm, today's exam does not include contrast and accordingly this may simply represent mixed density hematoma rather than endoleak. 4. Small bilateral pleural effusions with passive atelectasis. Additional airspace opacity at the right lung base may represent additional atelectasis or pneumonia. 5. Mild complex pelvic ascites, hemoperitoneum not excluded. 6. Aortic and right coronary artery atherosclerotic calcification. Electronically Signed   By: Gaylyn RongWalter  Liebkemann M.D.   On: 05/07/2018 15:52   Dg Abd 1 View - Kub  Result Date: 05/10/2018 CLINICAL DATA:  Postoperative ileus EXAM: ABDOMEN - 1 VIEW COMPARISON:  Yesterday FINDINGS: Nasogastric tube with tip near the pylorus. Unchanged diffuse gaseous distension of bowel, preferentially affecting small bowel. No concerning mass effect or calcification. The stomach is distended by fluid and gas. IMPRESSION: 1. History of ileus with persistent generalized bowel distension. 2. Stomach appears moderately distended by fluid and gas despite nasoenteric tube. Electronically Signed   By: Marnee SpringJonathon  Watts M.D.   On: 05/10/2018 09:23   Dg Abd 1 View  Result Date: 05/09/2018 CLINICAL DATA:  73 year old female with  orogastric tube placement. Subsequent encounter. EXAM: ABDOMEN - 1 VIEW COMPARISON:  05/09/2018. FINDINGS: Nasogastric tube placed with tip at the expected gastric pylorus/proximal duodenum level and side hole gastric antrum level. Gas-filled stomach. Gas distended small bowel loops as noted on prior exam. No free air detected. Hazy opacification lung bases greater on the right.  Cardiomegaly. IMPRESSION: 1. Nasogastric tube placed with tip at the gastric pylorus/proximal duodenum level and side hole gastric antrum level. Gas-filled stomach. 2. Gas distended small bowel loops as noted on prior exam. Electronically Signed   By: Lacy DuverneySteven  Olson M.D.   On: 05/09/2018 13:08   Dg Abd 1 View  Result Date: 05/09/2018 CLINICAL DATA:  abd tenderness,ileus EXAM: ABDOMEN - 1 VIEW COMPARISON:  CT of the abdomen and pelvis on 05/07/2018 FINDINGS: There is persistent significant small bowel dilatation. Loops of colon contain air and stool but are not dilated. There is no evidence for free intraperitoneal air on the supine view performed. IMPRESSION: Persistent small bowel obstruction. Electronically Signed   By: Norva PavlovElizabeth  Brown M.D.   On: 05/09/2018 08:52   Dg Abd 1 View  Result Date: 05/07/2018 CLINICAL DATA:  Followup bowel obstruction. EXAM: ABDOMEN - 1 VIEW COMPARISON:  05/06/2018 FINDINGS: The NG tube is in the body region of the stomach. Interval slight decrease in size of the dilated air-filled small bowel loops. There is some scattered air in the right colon. No free air. IMPRESSION: NG tube in good position. Interval slight decrease in dilated small bowel. Electronically Signed   By: Rudie MeyerP.  Gallerani M.D.   On: 05/07/2018 11:23   Ct Abdomen Pelvis W Contrast  Result Date: 04/28/2018 CLINICAL DATA:  Right lower quadrant pain, nausea. EXAM: CT ABDOMEN AND PELVIS WITH CONTRAST TECHNIQUE: Multidetector CT imaging of the abdomen and pelvis was performed using  the standard protocol following bolus administration of  intravenous contrast. CONTRAST:  ISOVUE-300 IOPAMIDOL (ISOVUE-300) INJECTION 61% COMPARISON:  None. FINDINGS: Lower chest: Heart is normal size. Descending thoracic aorta is heavily calcified. Dependent atelectasis in the lower lobes. No effusions. Hepatobiliary: Scattered hypodensities in the liver, likely small cysts. Gallbladder unremarkable. Pancreas: No focal abnormality or ductal dilatation. Spleen: No focal abnormality.  Normal size. Adrenals/Urinary Tract: No adrenal abnormality. No focal renal abnormality. No stones or hydronephrosis. Urinary bladder is unremarkable. Stomach/Bowel: Stomach, large and small bowel grossly unremarkable. Vascular/Lymphatic: There is an 8.3 cm infrarenal abdominal aortic aneurysm. Extensive thrombus/plaque noted. In addition, abnormal soft tissue is noted adjacent to the right side of the aneurysm and extending around the right kidney and in the right retroperitoneum compatible with large hematoma, likely related to aortic rupture. No visible active extravasation. Aneurysm in at the aortic bifurcation. Iliac vessels are heavily calcified, normal caliber. No adenopathy. Reproductive: Uterus and adnexa unremarkable.  No mass. Other: Large right retroperitoneal hematoma noted surrounding the right kidney and extending into the upper pelvis. No free fluid or free air. Bilateral inguinal hernias noted containing fat. Musculoskeletal: No acute bony abnormality. IMPRESSION: 8.3 cm infrarenal abdominal aortic aneurysm with large right retroperitoneal hematoma most compatible with aortic rupture. Critical Value/emergent results were called by telephone at the time of interpretation on 04/28/2018 at 6:40 pm to Dr. Benjiman Core , who verbally acknowledged these results. Electronically Signed   By: Charlett Nose M.D.   On: 04/28/2018 18:40   Dg Chest Port 1 View  Result Date: 05/12/2018 CLINICAL DATA:  Leukocytosis EXAM: PORTABLE CHEST 1 VIEW COMPARISON:  Three days ago  FINDINGS: Right upper extremity PICC with tip at the upper cavoatrial junction. The orogastric tube tip is at the distal stomach. Hazy density at the right base, stable. There is atelectasis and pleural fluid on preceding abdominal CT. Borderline heart size. IMPRESSION: 1. Unchanged hazy opacity the right more than left base correlating with atelectasis and pleural fluid on most recent abdominal CT. Superimposed infection is not excluded. 2. Unremarkable hardware positioning. Electronically Signed   By: Marnee Spring M.D.   On: 05/12/2018 11:25   Dg Chest Port 1 View  Result Date: 05/09/2018 CLINICAL DATA:  Sob,resp failure ,,hx pleural effusion EXAM: PORTABLE CHEST 1 VIEW COMPARISON:  05/08/2018 FINDINGS: Nasogastric tube has been removed. Shallow lung inflation. Heart size is accentuated by technique. There is persistent perihilar and RIGHT basilar opacity. RIGHT pleural effusion. IMPRESSION: 1. Shallow lung inflation. 2. Persistent perihilar and RIGHT basilar opacity and RIGHT pleural effusion. Electronically Signed   By: Norva Pavlov M.D.   On: 05/09/2018 08:50   Dg Chest Port 1 View  Result Date: 05/08/2018 CLINICAL DATA:  Shortness of breath, acute respiratory failure. EXAM: PORTABLE CHEST 1 VIEW COMPARISON:  Radiograph of May 05, 2018. FINDINGS: Stable cardiomediastinal silhouette. Atherosclerosis of thoracic aorta is noted. Distal tip of nasogastric tube is seen in proximal stomach. No pneumothorax is noted. Stable bilateral perihilar and basilar opacities are noted with small right pleural effusion. Bony thorax is unremarkable. IMPRESSION: Stable bilateral perihilar and basilar opacities are noted with small right pleural effusion. Aortic Atherosclerosis (ICD10-I70.0). Electronically Signed   By: Lupita Raider, M.D.   On: 05/08/2018 07:55   Dg Chest Port 1 View  Result Date: 05/05/2018 CLINICAL DATA:  Respiratory failure.  Pneumonia. EXAM: PORTABLE CHEST 1 VIEW COMPARISON:   05/04/2018.  05/02/2018.  04/29/2018. FINDINGS: Patchy pulmonary density in the mid and lower lungs bilaterally  persists consistent with mild bronchopneumonia and atelectasis. No worsening or new finding. IMPRESSION: Persistent mild patchy bilateral density consistent with atelectasis or pneumonia. No new finding. Electronically Signed   By: Paulina Fusi M.D.   On: 05/05/2018 08:18   Dg Chest Port 1 View  Result Date: 05/04/2018 CLINICAL DATA:  Status post AAA repair EXAM: PORTABLE CHEST 1 VIEW COMPARISON:  05/02/2018 FINDINGS: Cardiac shadow remains enlarged. Aortic calcifications are again seen. The overall inspiratory effort is poor persistent right basilar opacity is noted although significantly improved when compared with the prior exam. Patchy opacities are noted in the left mid lung. Overall interstitial edema is noted as well. No new focal abnormality is seen. IMPRESSION: Diffuse interstitial edema. Patchy infiltrative/atelectatic changes are noted bilaterally although slightly improved when compared with the prior exam. Electronically Signed   By: Alcide Clever M.D.   On: 05/04/2018 08:36   Dg Chest Port 1 View  Result Date: 05/02/2018 CLINICAL DATA:  Shortness of breath EXAM: PORTABLE CHEST 1 VIEW COMPARISON:  04/30/2018 FINDINGS: There is subtly increased bibasilar, right greater than left heterogeneous opacity and pleural effusions. There is mild underlying diffuse interstitial pulmonary opacity. Findings are concerning for worsened edema and effusions. There has been interval removal of an esophagogastric tube and right neck vascular sheath. Unchanged cardiomegaly. The visualized skeletal structures are unremarkable. IMPRESSION: There is subtly increased bibasilar, right greater than left heterogeneous opacity and pleural effusions. There is mild underlying diffuse interstitial pulmonary opacity. Findings are concerning for worsened edema and effusions. Unchanged cardiomegaly. Electronically Signed    By: Lauralyn Primes M.D.   On: 05/02/2018 16:15   Dg Chest Port 1 View  Result Date: 04/30/2018 CLINICAL DATA:  Abdominal aortic aneurysm. EXAM: PORTABLE CHEST 1 VIEW COMPARISON:  Radiograph of April 29, 2018. FINDINGS: Stable cardiomediastinal silhouette. Atherosclerosis of thoracic aorta is noted. Endotracheal tube and Swan-Ganz catheter have been removed. Distal tip of nasogastric tube is seen in the stomach. No pneumothorax is noted. Mild bibasilar atelectasis or infiltrates are noted with small pleural effusions. Bony thorax is unremarkable. IMPRESSION: Mild bibasilar atelectasis or infiltrates are noted with small pleural effusions, right greater than left. Endotracheal tube has been removed. Nasogastric tube is in grossly good position. Electronically Signed   By: Lupita Raider, M.D.   On: 04/30/2018 07:46   Dg Chest Port 1 View  Result Date: 04/29/2018 CLINICAL DATA:  Followup intubated patient. EXAM: PORTABLE CHEST 1 VIEW COMPARISON:  04/29/2018 at 0052 hours FINDINGS: Right internal jugular Swan-Ganz catheter, endotracheal tube and nasal/orogastric tube are stable and well positioned. There are prominent bronchovascular markings. Additional opacity is noted in the lung bases likely a combination of small effusions and atelectasis. This is accentuated by low lung volumes. Findings are without change from the earlier exam. No pneumothorax. IMPRESSION: 1. No significant change from the study obtained earlier today. 2. Support apparatus is well positioned. 3. Prominent bronchovascular markings and persistent lung base opacity consistent with small effusions and atelectasis. No convincing pneumonia or pulmonary edema. Electronically Signed   By: Amie Portland M.D.   On: 04/29/2018 11:56   Dg Chest Port 1 View  Result Date: 04/29/2018 CLINICAL DATA:  Postop abdominal aortic aneurysm stent. EXAM: PORTABLE CHEST 1 VIEW COMPARISON:  12/19/2014 FINDINGS: Endotracheal tube with tip measuring 4.2 cm above  the carina. Enteric tube tip is off the field of view but below the left hemidiaphragm. Right Swan-Ganz catheter with tip over the pulmonary outflow tract. No pneumothorax. Shallow inspiration with linear  atelectasis in the lung bases, greater on the left. Probable small left pleural effusion. Heart size and pulmonary vascularity are normal for technique. No focal consolidation in the lungs. Mediastinal contours appear intact. Calcification of the aorta. IMPRESSION: Appliances appear in satisfactory location. Shallow inspiration with atelectasis in the lung bases, greater on the left. Probable small left pleural effusion. Electronically Signed   By: Burman NievesWilliam  Stevens M.D.   On: 04/29/2018 01:16   Dg Abd Portable 1v-small Bowel Obstruction Protocol-initial, 8 Hr Delay  Result Date: 05/11/2018 CLINICAL DATA:  Small bowel obstruction EXAM: PORTABLE ABDOMEN - 1 VIEW COMPARISON:  05/11/2018 FINDINGS: NG tube tip is in the distal stomach. Dilated small bowel loops again noted, not significantly changed since prior study. Gas noted within the colon. Surgical skin staples noted along the midline. IMPRESSION: Continue small bowel dilatation, not significantly changed. Electronically Signed   By: Charlett NoseKevin  Dover M.D.   On: 05/11/2018 22:59   Dg Abd Portable 1v-small Bowel Obstruction Protocol-initial, 8 Hr Delay  Result Date: 05/11/2018 CLINICAL DATA:  8 hour follow-up exam for small-bowel dilatation. EXAM: PORTABLE ABDOMEN - 1 VIEW COMPARISON:  05/10/2018 FINDINGS: Gastric catheter is noted within the stomach. Persistent small bowel dilatation is seen. Administered contrast is not well appreciated and likely diluted. Postsurgical changes are again seen. No free air is noted. No other focal abnormality is seen. IMPRESSION: Generalized stable small bowel dilatation consistent with ileus or small-bowel obstruction. Previously administered contrast is not well appreciated on today's exam. Electronically Signed   By: Alcide CleverMark   Lukens M.D.   On: 05/11/2018 03:23   Dg Abd Portable 1v  Result Date: 05/06/2018 CLINICAL DATA:  Vomiting EXAM: PORTABLE ABDOMEN - 1 VIEW COMPARISON:  May 26, 2018 study obtained earlier in the day FINDINGS: Nasogastric tube tip and side port are in the stomach. There remain multiple loops of dilated bowel, similar to earlier in the day. Is no appreciable free air. There are skin staples present. Lung bases are clear. IMPRESSION: Persistent bowel dilatation. Skin staples present. Suspect postoperative ileus as most likely. A degree of developing bowel obstruction cannot be excluded by radiography, however. No free air demonstrable. Lung bases clear. Nasogastric tube tip and side port in stomach. Electronically Signed   By: Bretta BangWilliam  Woodruff III M.D.   On: 05/06/2018 11:40   Dg Abd Portable 1v  Result Date: 05/06/2018 CLINICAL DATA:  Abdominal distension. History of surgery past Friday. EXAM: PORTABLE ABDOMEN - 1 VIEW COMPARISON:  CT scan 04/28/2018 FINDINGS: Moderate air distended small bowel along with mild distention of the right and transverse colon. Findings are most likely due to a postoperative ileus but could not exclude an Azaylea Maves or partial small bowel obstruction. Recommend correlation with bowel sounds and follow-up radiographs as indicated. No free air. IMPRESSION: Dilated bowel, mainly small-bowel. Postoperative ileus versus small-bowel obstruction. Recommend correlation with bowel sounds. Electronically Signed   By: Rudie MeyerP.  Gallerani M.D.   On: 05/06/2018 09:11   [place] Ekg Site Rite  Result Date: 05/11/2018 If Site Rite image not attached, placement could not be confirmed due to current cardiac rhythm.  [place] Ekg Site Rite  Result Date: 05/03/2018 [facility], placement could not be confirmed due to current cardiac rhythm.  Anti-infectives: Anti-infectives (From admission, onward)   Start     Dose/Rate Route Frequency Ordered Stop   05/03/18 1815  cefTRIAXone (ROCEPHIN) 1  g in sodium chloride 0.9 % 100 mL IVPB  Status:  Discontinued     1  g 200 mL/hr over 30 Minutes Intravenous Every 24 hours 05/03/18 1728 05/09/18 1148   04/29/18 0400  ceFAZolin (ANCEF) IVPB 2g/100 mL premix     2 g 200 mL/hr over 30 Minutes Intravenous Every 8 hours 04/29/18 0044 04/29/18 1259      Assessment/Plan: s/p Procedure(s): ANEURYSM ABDOMINAL AORTIC REPAIR WITH HEMASHIELD GOLD VASCULAR GRAFT 22X11 mm (N/A) Stable overall.  No GI issues.  Advancing diet as tolerated.  Will DC skin staples   LOS: 18 days   Mily Malecki 05/16/2018, 8:05 AM

## 2018-05-16 NOTE — Progress Notes (Signed)
PHARMACY - ADULT TOTAL PARENTERAL NUTRITION CONSULT NOTE   Pharmacy Consult:  TPN  Indication: Prolonged ileus  Patient Measurements: Height: 5\' 4"  (162.6 cm) Weight: 205 lb 14.6 oz (93.4 kg)(Scale A) IBW/kg (Calculated) : 54.7 TPN AdjBW (KG): 69.9 Body mass index is 35.34 kg/m.  Assessment:  59 YOF presented to [facility] with abdominal and right flank pain.  CT showed large infrarenal aneurysm with contained rupture in right retroperitoneal space.  Patient transferred to [facility] and underwent repair on 04/29/18.  Post-op, patient's diet advanced to dysphagia 2 and then developed abdominal distention with nausea and emesis on 05/06/18.  Pharmacy consulted to manage TPN.  GI: ileus resolving.  Prealbumin improved to 16.3, +BM and flatus.  Docusate, Senokot-S, IV Protonix Endo: no hx DM - CBGs controlled Insulin requirements in the past 24 hours: 13 units Lytes: 2/17 labs - CL elevated, Mag low for ileus, others WNL  Renal: SCr up to 1.3 with Lasix, BUN 47 - UOP 0.4 ml/kg/hr Pulm: COPD on 3L [place] - Flonase, Duonebs Cards: HTN/HLD, post-op Afib.  VSS - Lasix 80mg  IV BID started 2/15, hydralazine, IV labetalol, no AC unless Afib recurs Hepatobil: LFTs WNL, tbili 1.7 (no jaundice), TG WNL Neuro: A&O ID: afebrile, WBC down to 12.8 - not on abx TPN Access: PICC 05/11/18 TPN start date: 05/11/18  Nutritional Goals (per RD rec on  05/11/18): 1800-2000 kCal, 90-105gm protein, 1.8-2 L fluid per day  Current Nutrition: TPN Advance to full liquid diet - up to 50% of meals  Plan:  Wean TPN off per Surgery Reduce TPN to 35 ml/hr, then stop at 1800 (RN aware) D/C TPN labs and nursing care orders   Mariel Lukins D. Laney Potash, PharmD, BCPS, BCCCP 05/16/2018, 9:28 AM

## 2018-05-16 NOTE — Progress Notes (Signed)
Nutrition Follow-up  DOCUMENTATION CODES:   Obesity unspecified  INTERVENTION:  Provide Ensure Enlive po once daily, each supplement provides 350 kcal and 20 grams of protein.  Encourage adequate PO intake.  NUTRITION DIAGNOSIS:   Inadequate oral intake related to altered GI function as evidenced by NPO status; improving  GOAL:   Patient will meet greater than or equal to 90% of their needs; progressing  MONITOR:   PO intake, Supplement acceptance, Labs, I & O's, Skin  REASON FOR ASSESSMENT:   Consult New TPN/TNA  ASSESSMENT:   73 yo Female who presented to [facility] with abdominal and right flank pain.  CT scan showed large greater than 8 cm infrarenal aneurysm with contained rupture in the right retroperitoneal space.  She was directed to come immediately to [facility] to the operating room for surgery.  2/1 S/p AAA repair. 2/9 CT abd: distended SB loops with transition point in the proximal ileum. NGT removed 2/17.  Per MD note, pt fluid overloaded.   Diet has been advanced to a soft diet. Meal completion 100% today. Pt reports appetite has been fine with no abdominal discomfort. Per MD note, ileus has resolved. Per Pharmacy note, TPN has been weaned to 35 ml/hr with plans to stop TPN at 1800 today. RD to order nutritional supplements to aid in caloric and protein needs.  Labs and medications reviewed.    Diet Order:   Diet Order            DIET SOFT Room service appropriate? Yes; Fluid consistency: Thin  Diet effective now              EDUCATION NEEDS:   Not appropriate for education at this time  Skin:  Skin Assessment: Skin Integrity Issues: Skin Integrity Issues:: Incisions Incisions: abdomen  Last BM:  2/18  Height:   Ht Readings from Last 1 Encounters:  04/29/18 5\' 4"  (1.626 m)    Weight:   Wt Readings from Last 1 Encounters:  05/16/18 93.4 kg  05/01/18: 69.6 kg Net I/O: +3 L.  Ideal Body Weight:  54.5 kg  BMI:  Body mass index is 35.34  kg/m.  Estimated Nutritional Needs:   Kcal:  1800-2000  Protein:  90-105 gm  Fluid:  1.8-2.0 L    Roslyn Smiling, MS, RD, LDN Pager # [phone] After hours/ weekend pager # [phone]

## 2018-05-16 NOTE — Progress Notes (Signed)
Staples removed from abdominal incision per order.

## 2018-05-17 LAB — GLUCOSE, CAPILLARY
GLUCOSE-CAPILLARY: 104 mg/dL — AB (ref 70–99)
GLUCOSE-CAPILLARY: 111 mg/dL — AB (ref 70–99)
Glucose-Capillary: 113 mg/dL — ABNORMAL HIGH (ref 70–99)
Glucose-Capillary: 121 mg/dL — ABNORMAL HIGH (ref 70–99)
Glucose-Capillary: 97 mg/dL (ref 70–99)
Glucose-Capillary: 98 mg/dL (ref 70–99)

## 2018-05-17 LAB — BASIC METABOLIC PANEL
Anion gap: 6 (ref 5–15)
BUN: 61 mg/dL — ABNORMAL HIGH (ref 8–23)
CO2: 27 mmol/L (ref 22–32)
Calcium: 8.5 mg/dL — ABNORMAL LOW (ref 8.9–10.3)
Chloride: 105 mmol/L (ref 98–111)
Creatinine, Ser: 1.6 mg/dL — ABNORMAL HIGH (ref 0.44–1.00)
GFR calc Af Amer: 37 mL/min — ABNORMAL LOW (ref >60)
GFR calc non Af Amer: 32 mL/min — ABNORMAL LOW (ref >60)
Glucose, Bld: 101 mg/dL — ABNORMAL HIGH (ref 70–99)
POTASSIUM: 3.8 mmol/L (ref 3.5–5.1)
Sodium: 138 mmol/L (ref 135–145)

## 2018-05-17 NOTE — Progress Notes (Signed)
  Speech Language Pathology Treatment: Dysphagia  Patient Details Name: Leah Mata MRN: [id number] DOB: 1945/08/03 Today's Date: 05/17/2018 Time: 3291-9166 SLP Time Calculation (min) (ACUTE ONLY): 10 min  Assessment / Plan / Recommendation Clinical Impression  Pt wanted limited amounts of intake, saying that she was full. She continues to have a subtle baseline cough/throat clear that persists through PO intake, but she remains afebrile and she denies any increasing difficulty with breathing or coughing during meals. She had been advanced on previous date to soft diet by MD. Clinically pt appears to be tolerating diet and she does not have any subjective complaints. She is in agreement with no additional SLP f/u at this time. Aspiration and esophageal precautions were reinforced. Please reorder if there are any acute concerns.   HPI HPI: Pt is a 73 yo female who presented with abdominal and right flank pain. 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 S/p aneurysm abdominal aortic repair with post-op ileus. ETT 1/31-2/1. PMH: COPD, HTN, HLD      SLP Plan  All goals met       Recommendations  Diet recommendations: Regular;Thin liquid Liquids provided via: Cup;Straw Medication Administration: Whole meds with puree Supervision: Patient able to self feed;Intermittent supervision to cue for compensatory strategies Compensations: Slow rate;Small sips/bites;Follow solids with liquid Postural Changes and/or Swallow Maneuvers: Seated upright 90 degrees;Upright 30-60 min after meal                Oral Care Recommendations: Oral care BID Follow up Recommendations: None SLP Visit Diagnosis: Dysphagia, unspecified (R13.10) Plan: All goals met       GO                Venita Sheffield [name] 05/17/2018, 3:15 PM  Nuala Alpha, M.A. [place] Acute Environmental education officer [phone] Office [phone]

## 2018-05-17 NOTE — Progress Notes (Signed)
Occupational Therapy Treatment Patient Details Name: Leah Mata MRN: [id number] DOB: 22-Mar-1946 Today's Date: 05/17/2018    History of present illness Pt is a 73 y.o. F with significant PMH of COPD who presents with abdominal and right flank pain. 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 S/p aneurysm abdominal aortic repair. Hospitalization complicated by worsening SOB requiring bipap on 2/4 due to pulmonary effusion, tachycardia 2/5 requiring amiodorone drip.   OT comments  Pt limited by significant edema today. Educated in elevation of extremities. Pt assisted to return to bed following toileting on BSC. Cues needed for hand placement with sit<>stand. Pt maintaining Sp02 of 94% on 2L 02. Pt continues to be highly motivated to return to PLOF. 4 family members participating in session.  Follow Up Recommendations  CIR    Equipment Recommendations  Tub/shower seat    Recommendations for Other Services      Precautions / Restrictions Precautions Precautions: Fall Precaution Comments: watch 02 Restrictions Weight Bearing Restrictions: No       Mobility Bed Mobility Overal bed mobility: Needs Assistance Bed Mobility: Sit to Supine       Sit to supine: Min guard   General bed mobility comments: returned to bed  Transfers Overall transfer level: Needs assistance Equipment used: 4-wheeled walker Transfers: Sit to/from [employer] Sit to Stand: Min assist Stand pivot transfers: Min assist       General transfer comment: cues for hand placement for sit<>stand     Balance Overall balance assessment: Needs assistance Sitting-balance support: Feet supported Sitting balance-[name]: Good     Standing balance support: Bilateral upper extremity supported;During functional activity Standing balance-[name]: Poor Standing balance comment: reliance on UE support during functional task  and hygiene                           ADL either performed or assessed with clinical judgement   ADL Overall ADL's : Needs assistance/impaired Eating/Feeding: Sitting;Minimal assistance Eating/Feeding Details (indicate cue type and reason): edema limiting pt's ability to get her hand to mouth             Upper Body Dressing : Minimal assistance;Sitting Upper Body Dressing Details (indicate cue type and reason): to doff second gown Lower Body Dressing: Maximal assistance;Sit to/from stand Lower Body Dressing Details (indicate cue type and reason): edema limiting ability to cross foot over opposite knee Toilet Transfer: Minimal assistance;Stand-pivot;BSC Toilet Transfer Details (indicate cue type and reason): Min A for safety Toileting- Clothing Manipulation and Hygiene: Maximal assistance;Sit to/from stand         General ADL Comments: Continues to be motivated to participate in therapy     Vision       Perception     Praxis      Cognition Arousal/Alertness: Awake/alert Behavior During Therapy: WFL for tasks assessed/performed Overall Cognitive Status: Within Functional Limits for tasks assessed                                 General Comments: pt and family eager to learn how pt can improve        Exercises    Shoulder Instructions       General Comments     Pertinent Vitals/ Pain       Pain Assessment: No/denies pain  Home Living  Prior Functioning/Environment              Frequency  Min 2X/week        Progress Toward Goals  OT Goals(current goals can now be found in the care plan section)  Progress towards OT goals: Progressing toward goals  Acute Rehab OT Goals Patient Stated Goal: to get home OT Goal Formulation: With patient Time For Goal Achievement: 05/31/18 Potential to Achieve Goals: Good  Plan Discharge plan remains appropriate     Co-evaluation                 AM-PAC OT "6 Clicks" Daily Activity     Outcome Measure   Help from another person eating meals?: A Little Help from another person taking care of personal grooming?: A Lot Help from another person toileting, which includes using toliet, bedpan, or urinal?: A Lot Help from another person bathing (including washing, rinsing, drying)?: A Lot Help from another person to put on and taking off regular upper body clothing?: A Little Help from another person to put on and taking off regular lower body clothing?: A Lot 6 Click Score: 14    End of Session Equipment Utilized During Treatment: Gait belt;Rolling walker;Oxygen  OT Visit Diagnosis: Unsteadiness on feet (R26.81);Other abnormalities of gait and mobility (R26.89);Muscle weakness (generalized) (M62.81);Pain   Activity Tolerance Patient tolerated treatment well   Patient Left in bed;with call bell/phone within reach;with family/visitor present   Nurse Communication          Time: 8325-4982 OT Time Calculation (min): 17 min  Charges: OT General Charges $OT Visit: 1 Visit OT Treatments $Self Care/Home Management : 8-22 mins  Leah Mata, OTR/L Acute Rehabilitation Services Pager: [phone] Office: [phone]   Leah Mata 05/17/2018, 11:52 AM

## 2018-05-17 NOTE — Progress Notes (Signed)
Pt refused to ambulate last night and this am, stated she is too weak. Had huge bowel accident in bed. Pt ambulated in the room, now sitting in recliner, call bell within reach. Will continue to monitor.

## 2018-05-17 NOTE — Progress Notes (Signed)
Progress Note  Patient Name: Leah Mata Date of Encounter: 05/17/2018  Primary Cardiologist: Kristeen Miss, MD   Subjective   No complaints this am. Only frustrated with being swollen. No pain or dyspnea.   Inpatient Medications    Scheduled Meds: . chlorhexidine  15 mL Mouth Rinse BID  . sennosides  10 mL Per Tube QHS   And  . docusate  100 mg Per Tube QHS  . feeding supplement (ENSURE ENLIVE)  237 mL Oral Q1500  . fluticasone  2 spray Each Nare Daily  . furosemide  80 mg Intravenous Q8H  . heparin  5,000 Units Subcutaneous Q8H  . hydrALAZINE  15 mg Intravenous Q6H  . insulin aspart  0-15 Units Subcutaneous Q4H  . ipratropium-albuterol  3 mL Nebulization TID  . labetalol  10 mg Intravenous Q6H  . mouth rinse  15 mL Mouth Rinse q12n4p  . pantoprazole  40 mg Oral BID  . sodium chloride flush  10-40 mL Intracatheter Q12H  . sodium phosphate  1 enema Rectal Once   Continuous Infusions: . sodium chloride 250 mL (05/14/18 1211)  . magnesium sulfate 1 - 4 g bolus IVPB     PRN Meds: sodium chloride, alum & mag hydroxide-simeth, bisacodyl, guaiFENesin-dextromethorphan, hydrALAZINE, levalbuterol, magnesium sulfate 1 - 4 g bolus IVPB, morphine injection, ondansetron, oxyCODONE-acetaminophen, phenol, sodium chloride flush, sodium chloride flush   Vital Signs    Vitals:   05/17/18 0556 05/17/18 0602 05/17/18 0737 05/17/18 0810  BP:  (!) 159/68  132/70  Pulse:   70 73  Resp:  19 17 (!) 22  Temp:    97.8 F (36.6 C)  TempSrc:    Oral  SpO2:   95% 95%  Weight: 92.4 kg     Height:        Intake/Output Summary (Last 24 hours) at 05/17/2018 0920 Last data filed at 05/17/2018 0556 Gross per 24 hour  Intake 680 ml  Output 2000 ml  Net -1320 ml   Last 3 Weights 05/17/2018 05/16/2018 05/15/2018  Weight (lbs) 203 lb 12.8 oz 205 lb 14.6 oz 197 lb 15.6 oz  Weight (kg) 92.443 kg 93.4 kg 89.8 kg      Telemetry    Sinus - Personally Reviewed  ECG    n/a - Personally  Reviewed  Physical Exam   VS:  BP 132/70 (BP Location: Right Leg)   Pulse 73   Temp 97.8 F (36.6 C) (Oral)   Resp (!) 22   Ht 5\' 4"  (1.626 m)   Wt 92.4 kg Comment: Scale A  LMP 03/29/1993 (Approximate)   SpO2 95%   BMI 34.98 kg/m  , BMI Body mass index is 34.98 kg/m.  General: Well developed, well nourished, NAD  HEENT: OP clear, mucus membranes moist  SKIN: warm, dry. No rashes. Neuro: No focal deficits  Musculoskeletal: Muscle strength 5/5 all ext  Psychiatric: Mood and affect normal  Neck: No JVD, no carotid bruits, no thyromegaly, no lymphadenopathy.  Lungs:Clear bilaterally, no wheezes, rhonci, crackles Cardiovascular: Regular rate and rhythm.systolic murmur.  Abdomen:Soft. Bowel sounds present. Non-tender.  Extremities: 3 + bilateral lower extremity edema. 2+ bilateral upper ext edema.    Labs    Chemistry Recent Labs  Lab 05/11/18 [phone]  05/13/18 0417 05/15/18 0657 05/17/18 0432  NA 145   < > 147* 143 138  K 3.4*   < > 3.7 4.0 3.8  CL 112*   < > 114* 113* 105  CO2 25   < >  24 24 27   GLUCOSE 135*   < > 157* 131* 101*  BUN 25*   < > 35* 47* 61*  CREATININE 1.11*   < > 1.20* 1.30* 1.60*  CALCIUM 9.2   < > 9.0 8.5* 8.5*  PROT 5.4*  --   --  4.9*  --   ALBUMIN 2.3*  --   --  2.0*  --   AST 33  --   --  26  --   ALT 27  --   --  27  --   ALKPHOS 84  --   --  116  --   BILITOT 1.6*  --   --  1.7*  --   GFRNONAA 50*   < > 45* 41* 32*  GFRAA 58*   < > 53* 48* 37*  ANIONGAP 8   < > 9 6 6    < > = values in this interval not displayed.     Hematology Recent Labs  Lab 05/13/18 0417 05/15/18 0657 05/16/18 0427  WBC 18.6* 16.5* 12.8*  RBC 3.92 3.68* 3.43*  HGB 11.3* 10.8* 10.1*  HCT 34.3* 33.2* 31.2*  MCV 87.5 90.2 91.0  MCH 28.8 29.3 29.4  MCHC 32.9 32.5 32.4  RDW 18.3* 18.3* 18.3*  PLT 142* 133* 132*    Cardiac EnzymesNo results for input(s): TROPONINI in the last 168 hours. No results for input(s): TROPIPOC in the last 168 hours.   BNP No  results for input(s): BNP, PROBNP in the last 168 hours.   DDimer No results for input(s): DDIMER in the last 168 hours.   Radiology    No results found.  Cardiac Studies   Echo 05/03/18: IMPRESSIONS   1. The left ventricle has hyperdynamic systolic function of >65%. The cavity size is normal. There is no increased left ventricular wall thickness. Echo evidence of impaired diastolic relaxation.  2. The right ventricle has normal systolic function. The cavity in normal in size. There is no increase in right ventricular wall thickness.  3. The mitral valve is normal in structure.  4. The aortic valve has an indeterminant number of cusps.  5. The pulmonic valve is Not well seen. Pulmonic valve regurgitation was not assessed by color flow Doppler.  6. There is mild dilatation of the aortic root.  7. The interatrial septum was not assessed.  8. Extremely limited due to poor sound wave transmission; definity used; vigorous LV systolic function; mild diastolic dysfunction.  Patient Profile     73 y.o. female  with history of hypertension, hyperlipidemia, and COPD who developed post operative atrial fibrillation and volume overload after repair of a ruptured AAA.  Assessment & Plan    1. Post-operative atrial fibrillation: She is in sinus this am. No plans for anti-coagulation unless there is recurrence of atrial fib. Sinus this am. Will not plan anti-coagulation unless she has recurrence of atrial fibrillation.   2. Hypertension: BP controlled on current therapy. Can resume home anti-hypertensive medications when taking po.   3. Acute diastolic CHF/Volume overload postoperative: Good diuresis yesterday but she is still volume overloaded. Slight bump in creatinine. Continue IV Lasix TID today. Repeat BMET in am  For questions or updates, please contact [facility] Please consult [email or web address] for contact info under        Signed, Verne Carrow, MD  05/17/2018, 9:20 AM

## 2018-05-17 NOTE — Progress Notes (Signed)
Physical Therapy Treatment Patient Details Name: RENNE Mata MRN: [id number] DOB: October 22, 1945 Today's Date: 05/17/2018    History of Present Illness Pt is a 73 y.o. F with significant PMH of COPD who presents with abdominal and right flank pain. 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 S/p aneurysm abdominal aortic repair. Hospitalization complicated by worsening SOB requiring bipap on 2/4 due to pulmonary effusion, tachycardia 2/5 requiring amiodorone drip.    PT Comments    Pt feeling better today - less SOB with activity.  Pt walked 80 feet twice with rollator (emphasis on safety and breathing on 2L).  Pt 95% on 2L at rest HR 74bpm and after 80 feet 94% on 2L with HR 90bpm.  Pt encouraged to stand at chair and march in place with family assist throughout the day.  Pt educated on sitting exercises and inspirometer.  She should continue to improve - she will be doing more during the day to prepare for CIR activity level.   Follow Up Recommendations  CIR     Equipment Recommendations  Rolling walker with 5" wheels;3in1 (PT)    Recommendations for Other Services       Precautions / Restrictions Precautions Precautions: Fall    Mobility  Bed Mobility               General bed mobility comments: pt up in chair upon arrival  Transfers   Equipment used: 4-wheeled walker Transfers: Sit to/from Stand   Stand pivot transfers: Min assist       General transfer comment: pt needed max verbal and tactile cues to push up and reach back from recliner or BSC - emphasis on this technique  Ambulation/Gait Ambulation/Gait assistance: Min assist Gait Distance (Feet): (80 feet x 2) Assistive device: 4-wheeled walker Gait Pattern/deviations: Step-through pattern;Decreased stride length;Wide base of support;Trunk flexed     General Gait Details: pt walked with rollator - cued for slow technique and breathing  strategies iwth activity.  pt less SOB today - on 2L O2.  pt 95% at rest on 2L with HR 74bpm and walked 80 feet with 2L and resting O2 94% and HR 90bpm   Stairs             Wheelchair Mobility    Modified Rankin (Stroke Patients Only)       Balance               Standing balance comment: reliance on UE support during functional task and hygiene                            Cognition Arousal/Alertness: Awake/alert Behavior During Therapy: WFL for tasks assessed/performed Overall Cognitive Status: Within Functional Limits for tasks assessed                                 General Comments: pt and family very cooperative      Exercises Total Joint Exercises Ankle Circles/Pumps: AROM;Both;10 reps(cues to do slow technque) Long Arc Quad: AROM;Both;10 reps(cues to do slow technque)    General Comments General comments (skin integrity, edema, etc.): pts family in room and involved.  educated her on how to help pt stand at chair and walk in place with O2 and rollator to help increase pts activity level      Pertinent Vitals/Pain Pain Assessment:  No/denies pain    Home Living                      Prior Function            PT Goals (current goals can now be found in the care plan section) Acute Rehab PT Goals Patient Stated Goal: to get home Progress towards PT goals: Progressing toward goals    Frequency    Min 3X/week      PT Plan Current plan remains appropriate    Co-evaluation              AM-PAC PT "6 Clicks" Mobility   Outcome Measure  Help needed turning from your back to your side while in a flat bed without using bedrails?: A Little   Help needed moving to and from a bed to a chair (including a wheelchair)?: A Little Help needed standing up from a chair using your arms (e.g., wheelchair or bedside chair)?: A Little Help needed to walk in hospital room?: A Little Help needed climbing 3-5 steps with a  railing? : Total 6 Click Score: 13    End of Session Equipment Utilized During Treatment: Oxygen Activity Tolerance: Patient tolerated treatment well Patient left: in chair;with call bell/phone within reach;with family/visitor present Nurse Communication: Mobility status PT Visit Diagnosis: Unsteadiness on feet (R26.81);Muscle weakness (generalized) (M62.81)     Time: 1027-2536 PT Time Calculation (min) (ACUTE ONLY): 25 min  Charges:  $Gait Training: 8-22 mins $Therapeutic Exercise: 8-22 mins                     05/17/2018   Ranae Palms, PT    Judson Roch 05/17/2018, 9:51 AM

## 2018-05-17 NOTE — Progress Notes (Addendum)
  Progress Note    05/17/2018 8:07 AM 19 Days Post-Op  Subjective:  Says she had a hard time walking yesterday due to swelling.   Tm 99.1 now afebrile HR 60's-80's NSR 120's-150's systolic 95% 2LO2NC  Vitals:   05/17/18 0602 05/17/18 0737  BP: (!) 159/68   Pulse:  70  Resp: 19 17  Temp:    SpO2:  95%    Physical Exam: Cardiac:  Regular Lungs:  Non labored Incisions:  Clean with some serous drainage distal portion of incision.  Extremities:  Bilateral feet are warm; 2+ pitting edema BLE Abdomen:  Soft, distended. +BM; +BS  CBC    Component Value Date/Time   WBC 12.8 (H) 05/16/2018 0427   RBC 3.43 (L) 05/16/2018 0427   HGB 10.1 (L) 05/16/2018 0427   HCT 31.2 (L) 05/16/2018 0427   PLT 132 (L) 05/16/2018 0427   MCV 91.0 05/16/2018 0427   MCH 29.4 05/16/2018 0427   MCHC 32.4 05/16/2018 0427   RDW 18.3 (H) 05/16/2018 0427   LYMPHSABS 0.8 05/15/2018 0657   MONOABS 2.0 (H) 05/15/2018 0657   EOSABS 0.1 05/15/2018 0657   BASOSABS 0.1 05/15/2018 0657    BMET    Component Value Date/Time   NA 138 05/17/2018 0432   K 3.8 05/17/2018 0432   CL 105 05/17/2018 0432   CO2 27 05/17/2018 0432   GLUCOSE 101 (H) 05/17/2018 0432   BUN 61 (H) 05/17/2018 0432   CREATININE 1.60 (H) 05/17/2018 0432   CALCIUM 8.5 (L) 05/17/2018 0432   GFRNONAA 32 (L) 05/17/2018 0432   GFRAA 37 (L) 05/17/2018 0432    INR    Component Value Date/Time   INR 1.16 04/30/2018 0413     Intake/Output Summary (Last 24 hours) at 05/17/2018 0807 Last data filed at 05/17/2018 0556 Gross per 24 hour  Intake 680 ml  Output 2000 ml  Net -1320 ml     Assessment:  73 y.o. female is s/p:  Repair of ruptured AAA  19 Days Post-Op  Plan: -pt tolerated diet with BM -creatinine up at 1.6.  Frequency of diuresis increased yesterday.  Pt continues to have generalized edema.  2+ pitting edema BLE.  Pt having some difficulty walking due to swelling.  -leukocytosis improved yesterday -some serous  drainage from distal portion of incision on bandage.   -CIR working on Therapist, occupational.  -DVT prophylaxis:  Sq heparin   Doreatha Massed, PA-C [facility] [phone] 05/17/2018 8:07 AM   I have examined the patient, reviewed and agree with above.  Gretta Began, MD 05/17/2018 1:36 PM

## 2018-05-17 NOTE — Progress Notes (Addendum)
Inpatient Rehabilitation Admissions Coordinator  I met with patient with her son , daughter in law and Dr. McAlhany at bedside. Pt currently receiving meds of IV hydralazine and diuresing with IV lasix. We await medical readiness before pursuing insurance approval for a possible inpt rehab admit. Patient and family are in agreement.  Barbara Boyette, RN, MSN Rehab Admissions Coordinator [phone] 05/17/2018 10:23 AM  

## 2018-05-18 LAB — BASIC METABOLIC PANEL
Anion gap: 10 (ref 5–15)
BUN: 70 mg/dL — ABNORMAL HIGH (ref 8–23)
CO2: 24 mmol/L (ref 22–32)
Calcium: 8.3 mg/dL — ABNORMAL LOW (ref 8.9–10.3)
Chloride: 101 mmol/L (ref 98–111)
Creatinine, Ser: 1.81 mg/dL — ABNORMAL HIGH (ref 0.44–1.00)
GFR calc non Af Amer: 28 mL/min — ABNORMAL LOW (ref >60)
GFR, EST AFRICAN AMERICAN: 32 mL/min — AB (ref >60)
Glucose, Bld: 110 mg/dL — ABNORMAL HIGH (ref 70–99)
Potassium: 3.5 mmol/L (ref 3.5–5.1)
Sodium: 135 mmol/L (ref 135–145)

## 2018-05-18 LAB — GLUCOSE, CAPILLARY
GLUCOSE-CAPILLARY: 101 mg/dL — AB (ref 70–99)
GLUCOSE-CAPILLARY: 105 mg/dL — AB (ref 70–99)
Glucose-Capillary: 107 mg/dL — ABNORMAL HIGH (ref 70–99)
Glucose-Capillary: 125 mg/dL — ABNORMAL HIGH (ref 70–99)
Glucose-Capillary: 109 mg/dL — ABNORMAL HIGH (ref 70–99)
Glucose-Capillary: 90 mg/dL (ref 70–99)

## 2018-05-18 MED ORDER — METOPROLOL SUCCINATE ER 50 MG PO TB24
50.0000 mg | ORAL_TABLET | Freq: Every day | ORAL | Status: DC
  Administered 2018-05-18 – 2018-05-22 (×5): 50 mg via ORAL
  Filled 2018-05-18 (×5): qty 1

## 2018-05-18 MED ORDER — ALPRAZOLAM 0.25 MG PO TABS
0.2500 mg | ORAL_TABLET | Freq: Two times a day (BID) | ORAL | Status: DC | PRN
  Administered 2018-05-18 – 2018-05-29 (×10): 0.25 mg via ORAL
  Filled 2018-05-18 (×10): qty 1

## 2018-05-18 MED ORDER — AMLODIPINE BESYLATE 5 MG PO TABS
5.0000 mg | ORAL_TABLET | Freq: Every day | ORAL | Status: DC
  Administered 2018-05-18 – 2018-05-22 (×5): 5 mg via ORAL
  Filled 2018-05-18 (×5): qty 1

## 2018-05-18 NOTE — Progress Notes (Signed)
Physical Therapy Treatment Patient Details Name: Leah Mata MRN: [id number] DOB: Oct 09, 1945 Today's Date: 05/18/2018    History of Present Illness Pt is a 73 y.o. F with significant PMH of COPD who presents with abdominal and right flank pain. 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 S/p aneurysm abdominal aortic repair. Hospitalization complicated by worsening SOB requiring bipap on 2/4 due to pulmonary effusion, tachycardia 2/5 requiring amiodorone drip.    PT Comments    Patient is progressing very well towards their physical therapy goals. Demonstrates improved activity tolerance and functional strength. Ambulating 90 ft x 2 with Rollator on 2L O2. Also focused on serial sit to stands for endurance training. CIR remains appropriate for continued strengthening and progression of mobility.     Follow Up Recommendations  CIR     Equipment Recommendations  3in1 (PT);Other (comment)(rollator)    Recommendations for Other Services       Precautions / Restrictions Precautions Precautions: Fall Precaution Comments: watch O2 Restrictions Weight Bearing Restrictions: No    Mobility  Bed Mobility Overal bed mobility: Needs Assistance       Supine to sit: Min guard        Transfers Overall transfer level: Needs assistance Equipment used: 4-wheeled walker Transfers: Sit to/from Stand   Stand pivot transfers: Min assist;Mod assist       General transfer comment: min-mod assist to stand from lower surface of toilet. from recliner, pt able to stand up with min guard assist with good glute activation  Ambulation/Gait Ambulation/Gait assistance: Min assist Gait Distance (Feet): 90 Feet(x2) Assistive device: 4-wheeled walker Gait Pattern/deviations: Step-through pattern;Decreased stride length;Wide base of support;Trunk flexed Gait velocity: decreased   General Gait Details: pt demonstrating good  posture throughout ambulation. cues for pursed lip breathing and activity pacing.   Stairs             Wheelchair Mobility    Modified Rankin (Stroke Patients Only)       Balance Overall balance assessment: Needs assistance Sitting-balance support: Feet supported Sitting balance-[name]: Good     Standing balance support: No upper extremity supported;During functional activity Standing balance-[name]: Fair                              Cognition Arousal/Alertness: Awake/alert Behavior During Therapy: WFL for tasks assessed/performed Overall Cognitive Status: Within Functional Limits for tasks assessed                                 General Comments: pt and family very cooperative      Exercises Other Exercises Other Exercises: Serial sit to stands x 5    General Comments        Pertinent Vitals/Pain Pain Assessment: No/denies pain    Home Living                      Prior Function            PT Goals (current goals can now be found in the care plan section) Acute Rehab PT Goals Patient Stated Goal: to get home Potential to Achieve Goals: Good Progress towards PT goals: Progressing toward goals    Frequency    Min 3X/week      PT Plan Current plan remains appropriate    Co-evaluation  AM-PAC PT "6 Clicks" Mobility   Outcome Measure  Help needed turning from your back to your side while in a flat bed without using bedrails?: A Little Help needed moving from lying on your back to sitting on the side of a flat bed without using bedrails?: A Little Help needed moving to and from a bed to a chair (including a wheelchair)?: A Little Help needed standing up from a chair using your arms (e.g., wheelchair or bedside chair)?: A Little Help needed to walk in hospital room?: A Little Help needed climbing 3-5 steps with a railing? : A Lot 6 Click Score: 17    End of Session Equipment  Utilized During Treatment: Oxygen Activity Tolerance: Patient tolerated treatment well Patient left: in bed;with call bell/phone within reach;with family/visitor present Nurse Communication: Mobility status PT Visit Diagnosis: Unsteadiness on feet (R26.81);Muscle weakness (generalized) (M62.81)     Time: 8811-0315 PT Time Calculation (min) (ACUTE ONLY): 27 min  Charges:  $Gait Training: 8-22 mins                     Laurina Bustle, [place], DPT Acute Rehabilitation Services Pager [phone] Office [phone]    Vanetta Mulders 05/18/2018, 12:58 PM

## 2018-05-18 NOTE — Care Management Important Message (Signed)
Important Message  Patient Details  Name: Leah Mata MRN: [id number] Date of Birth: Oct 03, 1945   Medicare Important Message Given:  Yes    Leah Mata 05/18/2018, 11:13 AM

## 2018-05-18 NOTE — Progress Notes (Addendum)
  Progress Note    05/18/2018 7:41 AM 20 Days Post-Op  Subjective:  Feels a little less swollen today.  Walked some yesterday  Afebrile HR 60's-70's NSR 120's systolic 96% 2LO2NC   Vitals:   05/17/18 1958 05/17/18 2021  BP:  (!) 125/47  Pulse:  78  Resp:  (!) 22  Temp:  97.9 F (36.6 C)  SpO2: 95% 95%    Physical Exam: General:  No distress Lungs:  Non labored Incisions:  Clean; bandage removed from distal portion of incision; serous drainage present on bandage; unable to express any fluid from incision.  Dry bandage replaced Extremities:  Bilateral feet are warm Abdomen:  Distended but soft; NT; +BM  CBC    Component Value Date/Time   WBC 12.8 (H) 05/16/2018 0427   RBC 3.43 (L) 05/16/2018 0427   HGB 10.1 (L) 05/16/2018 0427   HCT 31.2 (L) 05/16/2018 0427   PLT 132 (L) 05/16/2018 0427   MCV 91.0 05/16/2018 0427   MCH 29.4 05/16/2018 0427   MCHC 32.4 05/16/2018 0427   RDW 18.3 (H) 05/16/2018 0427   LYMPHSABS 0.8 05/15/2018 0657   MONOABS 2.0 (H) 05/15/2018 0657   EOSABS 0.1 05/15/2018 0657   BASOSABS 0.1 05/15/2018 0657    BMET    Component Value Date/Time   NA 135 05/18/2018 0620   K 3.5 05/18/2018 0620   CL 101 05/18/2018 0620   CO2 24 05/18/2018 0620   GLUCOSE 110 (H) 05/18/2018 0620   BUN 70 (H) 05/18/2018 0620   CREATININE 1.81 (H) 05/18/2018 0620   CALCIUM 8.3 (L) 05/18/2018 0620   GFRNONAA 28 (L) 05/18/2018 0620   GFRAA 32 (L) 05/18/2018 0620    INR    Component Value Date/Time   INR 1.16 04/30/2018 0413     Intake/Output Summary (Last 24 hours) at 05/18/2018 0741 Last data filed at 05/18/2018 0345 Gross per 24 hour  Intake 480 ml  Output 1850 ml  Net -1370 ml     Assessment:  73 y.o. female is s/p:  Repair of ruptured AAA   20 Days Post-Op  Plan: -pt continues to tolerate diet -still swollen but was able to walk a couple of times yesterday -pt tolerating diet-ok for cardiology to convert IV meds to po today -creatinine up  today to 1.8 continue to monitor.  Diuresis per cards -bandage removed from distal portion of incision; serous drainage present on bandage; unable to express any fluid from incision.  Dry bandage replaced -DVT prophylaxis:  Sq heparin -CIR insurance approval on hold until pt is medically ready as she is receiving IV diuresis and antihypertensives.   Doreatha Massed, PA-C [facility] [phone] 05/18/2018 7:41 AM  I have examined the patient, reviewed and agree with above.  Creatinine up slightly today with to 1.8 from 1.6.  Tolerating diet and having bowel movements.  Does have some serous drainage from the inferior pole of the incision.  No evidence of dehiscence.  Gretta Began, MD 05/18/2018 11:14 AM

## 2018-05-18 NOTE — Progress Notes (Signed)
Progress Note  Patient Name: Leah Mata Date of Encounter: 05/18/2018  Primary Cardiologist: Kristeen Miss, MD   Subjective   No pain or dyspnea.   Inpatient Medications    Scheduled Meds: . chlorhexidine  15 mL Mouth Rinse BID  . sennosides  10 mL Per Tube QHS   And  . docusate  100 mg Per Tube QHS  . feeding supplement (ENSURE ENLIVE)  237 mL Oral Q1500  . fluticasone  2 spray Each Nare Daily  . furosemide  80 mg Intravenous Q8H  . heparin  5,000 Units Subcutaneous Q8H  . hydrALAZINE  15 mg Intravenous Q6H  . insulin aspart  0-15 Units Subcutaneous Q4H  . ipratropium-albuterol  3 mL Nebulization TID  . labetalol  10 mg Intravenous Q6H  . mouth rinse  15 mL Mouth Rinse q12n4p  . pantoprazole  40 mg Oral BID  . sodium chloride flush  10-40 mL Intracatheter Q12H  . sodium phosphate  1 enema Rectal Once   Continuous Infusions: . sodium chloride 250 mL (05/14/18 1211)  . magnesium sulfate 1 - 4 g bolus IVPB     PRN Meds: sodium chloride, alum & mag hydroxide-simeth, bisacodyl, guaiFENesin-dextromethorphan, hydrALAZINE, levalbuterol, magnesium sulfate 1 - 4 g bolus IVPB, morphine injection, ondansetron, oxyCODONE-acetaminophen, phenol, sodium chloride flush, sodium chloride flush   Vital Signs    Vitals:   05/17/18 1623 05/17/18 1958 05/17/18 2021 05/18/18 0345  BP: (!) 117/51  (!) 125/47   Pulse: 81  78   Resp: 18  (!) 22   Temp: 98 F (36.7 C)  97.9 F (36.6 C)   TempSrc: Oral  Oral   SpO2: 95% 95% 95%   Weight:    90.9 kg  Height:        Intake/Output Summary (Last 24 hours) at 05/18/2018 0753 Last data filed at 05/18/2018 0345 Gross per 24 hour  Intake 480 ml  Output 1850 ml  Net -1370 ml   Last 3 Weights 05/18/2018 05/17/2018 05/16/2018  Weight (lbs) 200 lb 4.8 oz 203 lb 12.8 oz 205 lb 14.6 oz  Weight (kg) 90.855 kg 92.443 kg 93.4 kg      Telemetry    Sinus, PACs- Personally Reviewed  ECG    n/a - Personally Reviewed  Physical Exam   VS:   BP (!) 125/47 (BP Location: Right Leg)   Pulse 78   Temp 97.9 F (36.6 C) (Oral)   Resp (!) 22   Ht 5\' 4"  (1.626 m)   Wt 90.9 kg Comment: Scale A  LMP 03/29/1993 (Approximate)   SpO2 95%   BMI 34.38 kg/m  , BMI Body mass index is 34.38 kg/m.  General: Well developed, well nourished, NAD  HEENT: OP clear, mucus membranes moist  SKIN: warm, dry. No rashes. Neuro: No focal deficits  Musculoskeletal: Muscle strength 5/5 all ext  Psychiatric: Mood and affect normal  Neck: No JVD, no carotid bruits, no thyromegaly, no lymphadenopathy.  Lungs:Clear bilaterally, no wheezes, rhonci, crackles Cardiovascular: Regular rate and rhythm. No murmurs, gallops or rubs. Abdomen:Soft. Bowel sounds present. Non-tender.  Extremities: 2+ bilateral lower extremity edema.     Labs    Chemistry Recent Labs  Lab 05/15/18 0657 05/17/18 0432 05/18/18 0620  NA 143 138 135  K 4.0 3.8 3.5  CL 113* 105 101  CO2 24 27 24   GLUCOSE 131* 101* 110*  BUN 47* 61* 70*  CREATININE 1.30* 1.60* 1.81*  CALCIUM 8.5* 8.5* 8.3*  PROT 4.9*  --   --  ALBUMIN 2.0*  --   --   AST 26  --   --   ALT 27  --   --   ALKPHOS 116  --   --   BILITOT 1.7*  --   --   GFRNONAA 41* 32* 28*  GFRAA 48* 37* 32*  ANIONGAP 6 6 10      Hematology Recent Labs  Lab 05/13/18 0417 05/15/18 0657 05/16/18 0427  WBC 18.6* 16.5* 12.8*  RBC 3.92 3.68* 3.43*  HGB 11.3* 10.8* 10.1*  HCT 34.3* 33.2* 31.2*  MCV 87.5 90.2 91.0  MCH 28.8 29.3 29.4  MCHC 32.9 32.5 32.4  RDW 18.3* 18.3* 18.3*  PLT 142* 133* 132*    Cardiac EnzymesNo results for input(s): TROPONINI in the last 168 hours. No results for input(s): TROPIPOC in the last 168 hours.   BNP No results for input(s): BNP, PROBNP in the last 168 hours.   DDimer No results for input(s): DDIMER in the last 168 hours.   Radiology    No results found.  Cardiac Studies   Echo 05/03/18: IMPRESSIONS   1. The left ventricle has hyperdynamic systolic function of >65%.  The cavity size is normal. There is no increased left ventricular wall thickness. Echo evidence of impaired diastolic relaxation.  2. The right ventricle has normal systolic function. The cavity in normal in size. There is no increase in right ventricular wall thickness.  3. The mitral valve is normal in structure.  4. The aortic valve has an indeterminant number of cusps.  5. The pulmonic valve is Not well seen. Pulmonic valve regurgitation was not assessed by color flow Doppler.  6. There is mild dilatation of the aortic root.  7. The interatrial septum was not assessed.  8. Extremely limited due to poor sound wave transmission; definity used; vigorous LV systolic function; mild diastolic dysfunction.  Patient Profile     73 y.o. female  with history of hypertension, hyperlipidemia, and COPD who developed post operative atrial fibrillation and volume overload after repair of a ruptured AAA.  Assessment & Plan    1. Post-operative atrial fibrillation: Sinus today with PACs. No plans for anti-coagulation unless there is recurrence of atrial fib.    2. Hypertension: BP controlled. Will stop IV medications today and resume home dose of Norvasc. Will not restart Lisinopril given renal insufficiency. Will start Toprol 50 mg daily.    3. Acute diastolic CHF/Volume overload postoperative: She continues to diurese well but remains volume overloaded. Slight bump in creatinine. I think we need to continue IV Lasix today. BMET in am.   For questions or updates, please contact [facility] Please consult [email or web address] for contact info under        Signed, Verne Carrow, MD  05/18/2018, 7:53 AM

## 2018-05-19 ENCOUNTER — Inpatient Hospital Stay (HOSPITAL_COMMUNITY): Payer: Medicare PPO

## 2018-05-19 LAB — GLUCOSE, CAPILLARY
Glucose-Capillary: 109 mg/dL — ABNORMAL HIGH (ref 70–99)
Glucose-Capillary: 110 mg/dL — ABNORMAL HIGH (ref 70–99)
Glucose-Capillary: 114 mg/dL — ABNORMAL HIGH (ref 70–99)
Glucose-Capillary: 124 mg/dL — ABNORMAL HIGH (ref 70–99)
Glucose-Capillary: 139 mg/dL — ABNORMAL HIGH (ref 70–99)
Glucose-Capillary: 88 mg/dL (ref 70–99)

## 2018-05-19 LAB — BASIC METABOLIC PANEL
ANION GAP: 10 (ref 5–15)
BUN: 74 mg/dL — ABNORMAL HIGH (ref 8–23)
CO2: 27 mmol/L (ref 22–32)
Calcium: 8.2 mg/dL — ABNORMAL LOW (ref 8.9–10.3)
Chloride: 97 mmol/L — ABNORMAL LOW (ref 98–111)
Creatinine, Ser: 1.94 mg/dL — ABNORMAL HIGH (ref 0.44–1.00)
GFR calc Af Amer: 29 mL/min — ABNORMAL LOW (ref >60)
GFR calc non Af Amer: 25 mL/min — ABNORMAL LOW (ref >60)
Glucose, Bld: 103 mg/dL — ABNORMAL HIGH (ref 70–99)
POTASSIUM: 3.1 mmol/L — AB (ref 3.5–5.1)
Sodium: 134 mmol/L — ABNORMAL LOW (ref 135–145)

## 2018-05-19 LAB — CBC
HCT: 30.7 % — ABNORMAL LOW (ref 36.0–46.0)
Hemoglobin: 10.2 g/dL — ABNORMAL LOW (ref 12.0–15.0)
MCH: 29.5 pg (ref 26.0–34.0)
MCHC: 33.2 g/dL (ref 30.0–36.0)
MCV: 88.7 fL (ref 80.0–100.0)
NRBC: 0 % (ref 0.0–0.2)
Platelets: 140 10*3/uL — ABNORMAL LOW (ref 150–400)
RBC: 3.46 MIL/uL — ABNORMAL LOW (ref 3.87–5.11)
RDW: 17.2 % — ABNORMAL HIGH (ref 11.5–15.5)
WBC: 10.9 10*3/uL — ABNORMAL HIGH (ref 4.0–10.5)

## 2018-05-19 LAB — OCCULT BLOOD X 1 CARD TO LAB, STOOL: Fecal Occult Bld: NEGATIVE

## 2018-05-19 MED ORDER — POTASSIUM CHLORIDE CRYS ER 20 MEQ PO TBCR
40.0000 meq | EXTENDED_RELEASE_TABLET | Freq: Once | ORAL | Status: AC
  Administered 2018-05-19: 40 meq via ORAL
  Filled 2018-05-19: qty 2

## 2018-05-19 NOTE — Progress Notes (Signed)
OT Cancellation Note  Patient Details Name: Leah Mata MRN: [id number] DOB: 10/01/1945   Cancelled Treatment:    Reason Eval/Treat Not Completed: Patient declined, no reason specified(Pt declined stating "I already walked today.")   Revonda Standard Cecil Cranker) Glendell Docker OTR/L Acute Rehabilitation Services Pager: [phone] Office: [phone]   Sandrea Hughs 05/19/2018, 3:48 PM

## 2018-05-19 NOTE — Progress Notes (Addendum)
Progress Note  Patient Name: Leah JewelLinda D Mata Date of Encounter: 05/19/2018  Primary Cardiologist: Kristeen MissPhilip Nahser, MD  Subjective   Edema slightly improved per her report but still really significant. Home weight is about 157. Today is 198lb. Peak was 205lb. Seems SOB following walking back from bathroom. Denies any known bleeding.  Inpatient Medications    Scheduled Meds: . amLODipine  5 mg Oral Daily  . chlorhexidine  15 mL Mouth Rinse BID  . sennosides  10 mL Per Tube QHS   And  . docusate  100 mg Per Tube QHS  . feeding supplement (ENSURE ENLIVE)  237 mL Oral Q1500  . fluticasone  2 spray Each Nare Daily  . furosemide  80 mg Intravenous Q8H  . heparin  5,000 Units Subcutaneous Q8H  . insulin aspart  0-15 Units Subcutaneous Q4H  . ipratropium-albuterol  3 mL Nebulization TID  . mouth rinse  15 mL Mouth Rinse q12n4p  . metoprolol succinate  50 mg Oral Daily  . pantoprazole  40 mg Oral BID  . sodium chloride flush  10-40 mL Intracatheter Q12H  . sodium phosphate  1 enema Rectal Once   Continuous Infusions: . sodium chloride 250 mL (05/14/18 1211)  . magnesium sulfate 1 - 4 g bolus IVPB     PRN Meds: sodium chloride, ALPRAZolam, alum & mag hydroxide-simeth, bisacodyl, guaiFENesin-dextromethorphan, hydrALAZINE, levalbuterol, magnesium sulfate 1 - 4 g bolus IVPB, morphine injection, ondansetron, oxyCODONE-acetaminophen, phenol, sodium chloride flush, sodium chloride flush   Vital Signs    Vitals:   05/19/18 0412 05/19/18 0418 05/19/18 0823 05/19/18 0941  BP: (!) 111/50   (!) 111/55  Pulse: 70 (!) 49  67  Resp: 20 20  17   Temp: 98.3 F (36.8 C)   98.1 F (36.7 C)  TempSrc: Oral   Oral  SpO2: 97% (!) 89% 91% 97%  Weight:  89.9 kg    Height:        Intake/Output Summary (Last 24 hours) at 05/19/2018 1201 Last data filed at 05/19/2018 1021 Gross per 24 hour  Intake 530 ml  Output 1575 ml  Net -1045 ml   Last 3 Weights 05/19/2018 05/18/2018 05/17/2018  Weight (lbs)  198 lb 3.1 oz 200 lb 4.8 oz 203 lb 12.8 oz  Weight (kg) 89.9 kg 90.855 kg 92.443 kg     Telemetry    NSR - Personally Reviewed  Physical Exam   GEN: No acute distress.  HEENT: Normocephalic, atraumatic, sclera non-icteric. Neck: No JVD or bruits. Cardiac: RRR no murmurs, rubs, or gallops.  Radials/DP/PT 1+ and equal bilaterally.  Respiratory: Coarse bilaterally. Breathing is unlabored on [place] GI: Soft, nontender, BS +x 4. MS: no deformity. Extremities: No clubbing or cyanosis. 1-2+ BLE pitting edema with mild UE edema as well.  Neuro:  AAOx3. Follows commands. Psych:  Responds to questions appropriately with a normal affect.  Labs    Chemistry Recent Labs  Lab 05/15/18 478 184 [id number] 05/17/18 0432 05/18/18 0620 05/19/18 0426  NA 143 138 135 134*  K 4.0 3.8 3.5 3.1*  CL 113* 105 101 97*  CO2 24 27 24 27   GLUCOSE 131* 101* 110* 103*  BUN 47* 61* 70* 74*  CREATININE 1.30* 1.60* 1.81* 1.94*  CALCIUM 8.5* 8.5* 8.3* 8.2*  PROT 4.9*  --   --   --   ALBUMIN 2.0*  --   --   --   AST 26  --   --   --   ALT 27  --   --   --  ALKPHOS 116  --   --   --   BILITOT 1.7*  --   --   --   GFRNONAA 41* 32* 28* 25*  GFRAA 48* 37* 32* 29*  ANIONGAP 6 6 10 10      Hematology Recent Labs  Lab 05/15/18 0657 05/16/18 0427 05/19/18 0426  WBC 16.5* 12.8* 10.9*  RBC 3.68* 3.43* 3.46*  HGB 10.8* 10.1* 10.2*  HCT 33.2* 31.2* 30.7*  MCV 90.2 91.0 88.7  MCH 29.3 29.4 29.5  MCHC 32.5 32.4 33.2  RDW 18.3* 18.3* 17.2*  PLT 133* 132* 140*    Cardiac EnzymesNo results for input(s): TROPONINI in the last 168 hours. No results for input(s): TROPIPOC in the last 168 hours.   BNPNo results for input(s): BNP, PROBNP in the last 168 hours.   DDimer No results for input(s): DDIMER in the last 168 hours.   Radiology    No results found.  Cardiac Studies   Echo 05/03/18: IMPRESSIONS  1. The left ventricle has hyperdynamic systolic function of >65%. The cavity size is normal. There is no  increased left ventricular wall thickness. Echo evidence of impaired diastolic relaxation. 2. The right ventricle has normal systolic function. The cavity in normal in size. There is no increase in right ventricular wall thickness. 3. The mitral valve is normal in structure. 4. The aortic valve has an indeterminant number of cusps. 5. The pulmonic valve is Not well seen. Pulmonic valve regurgitation was not assessed by color flow Doppler. 6. There is mild dilatation of the aortic root. 7. The interatrial septum was not assessed. 8. Extremely limited due to poor sound wave transmission; definity used; vigorous LV systolic function; mild diastolic dysfunction.  Patient Profile     73 y.o. female with HTN, HLD, COPD presented 04/28/18 with RUQ pain and found to have 8.3 cm infrarenal abdominal aortic aneurysm with large right retroperitoneal hematoma most compatible with aortic rupture.  She was transferred urgently to [facility] and underwent open repair with a Hemashield grafts. Post-op course notable for aspiration PNA, PAF, patial SBO vs ileus, hypoalbuminemia, volume overload (? Acute diastolic CHF), and AKI.  Assessment & Plan    1. AAA s/p repair - per vascular team.  2. HTN - BP controlled.  3. Post-op atrial fib - remains in NSR. No plans for anticoagulation unless she has recurrence of atrial fib - then will need to discuss with surgical team.  4. Post-op volume overload/?acute diastolic CHF - weight remains markedly above prior OP weights which were around 157-160. She has made a little progress since 2/18 when peak weight was 205lb. I/O's aren't really that impressive. Edema is improving but renal function is proving to be a barrier. Swelling is likely driven by low albumin as well - will f/u in AM. I will discuss plan for diuretic with Dr. Clifton James as BUN seems to be rising much faster comparative to creatinine. Question whether we need to involve nephrology. For edema,  could also consider changing amlodipine to alternative agent although I do not suspect this particularly would be causing such dramatic fluid retention. Will change diet to low sodium with fluid restriction. Will also add thyroid to labs in AM given fluid retention and PAF.  5. AKI, also with hypokalemia and hyponatremia - Cr continues to rise with BUN rising disproportionately faster. Consider nephrology consult - may require renal imaging but not sure what best modality would be given whether this has any relationship to AAA repair. There are  no overt signs of GIB to explain uremia but will check hemoccult. Addendum: spoke with Dr. Marisue Humble who acknowledged consult request. Regarding hypokalemia, will give KCl x 1 and await nephrology recs.  6. ABL anemia - presenting Hgb 11.7, up to 13.6 mid hospitalization, now down to 10.2. Check hemoccult. F/u Hgb in AM. Further per primary team.  For questions or updates, please contact [facility] Please consult [email or web address] for contact info under Cardiology/STEMI.  Signed, Laurann Montana, PA-C 05/19/2018, 12:01 PM    I have personally seen and examined this patient with Ronie Spies, PA-C. I agree with the assessment and plan as outlined above. She remains volume overloaded despite aggressive diuresis with IV Lasix. BUN/Creatinine worsening. This seems more complex than typical post-operative volume overload which usually responds to diuresis. Her albumin level is low contributing to third spacing of fluid. We will continue IV Lasix today and will ask Nephrology to see her today to assist with management.   Verne Carrow 05/19/2018 12:53 PM

## 2018-05-19 NOTE — Consult Note (Signed)
Leah Mata Admit Date: 04/28/2018 05/19/2018 Arita Miss Requesting Physician:  Clifton James MD  Reason for Consult:  Hypervolemia, inadequate diuresis, AKI HPI:  73 year old female who first presented on 2/1 with abdominal pain and right flank pain and findings of large infrarenal aortic aneurysm with rupture status post emergent repair on 2/1.  Past history includes tobacco use, hyperlipidemia, hypertension.  Presenting serum creatinine is 1.0.    Course complicated by atrial fibrillation, aspiration pneumonia, massive transfusion requirement, ileus/SBO requiring parenteral nutrition.  Presenting weight was 154 pounds, if accurate.  Peak weight of 205 pounds 3 days ago, currently 198 pounds.  Urine output has been consistently near or greater than 1 L.  No recent contrast exposure.  No recent NSAID exposure.  Currently receiving furosemide 80 mg IV 3 times daily.  Creatinine has slowly increased to a peak of 1.94 today.  Was near baseline on 2/13.  Patient without significant dyspnea at rest but does feel labored when up and about.  No lesions in feet. No eosinophilia.    Creatinine, Ser (mg/dL)  Date Value  25/49/8264 1.94 (H)  05/18/2018 1.81 (H)  05/17/2018 1.60 (H)  05/15/2018 1.30 (H)  05/13/2018 1.20 (H)  05/12/2018 1.32 (H)  05/11/2018 1.11 (H)  05/10/2018 1.15 (H)  05/10/2018 1.22 (H)  05/09/2018 1.53 (H)  ] I/Os: I/O last 3 completed shifts: In: 620 [P.O.:600; I.V.:20] Out: 2225 [Urine:2225]   ROS NSAIDS: No exposure IV Contrast no recent exposure TMP/SMX no exposure Hypotension not present Balance of 12 systems is negative w/ exceptions as above  PMH  Past Medical History:  Diagnosis Date  . BACK PAIN 03/14/2009  . COPD 11/01/2006  . GLUCOSE INTOLERANCE 03/14/2009  . Headache(784.0) 03/14/2009  . HYPERTENSION 11/01/2006  . 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 INSOMNIA, HX OF 01/06/2007  . Other and unspecified hyperlipidemia 08/22/2013  . Urinary  frequency 03/14/2009   PSH  Past Surgical History:  Procedure Laterality Date  . ABDOMINAL AORTIC ANEURYSM REPAIR N/A 04/28/2018   Procedure: ANEURYSM ABDOMINAL AORTIC REPAIR WITH HEMASHIELD GOLD VASCULAR GRAFT 22X11 mm;  Surgeon: Larina Earthly, MD;  Location: [facility] OR;  Service: Vascular;  Laterality: N/A;  . CARDIAC CATHETERIZATION  2003   negative  . DILATION AND CURETTAGE OF UTERUS    . TONSILLECTOMY    . TUBAL LIGATION     FH  Family History  Problem Relation Age of Onset  . Coronary artery disease Other        female 1st degree relative <60 and female 1st degree relative <50  . Diabetes Father   . Heart attack Father   . Diabetes Mother   . Hypertension Mother   . Congestive Heart Failure Mother   . Colon cancer Neg Hx   . Breast cancer Neg Hx    SH  reports that she has been smoking. She has never used smokeless tobacco. She reports that she does not drink alcohol or use drugs. Allergies  Allergies  Allergen Reactions  . Hydrochlorothiazide    Home medications Prior to Admission medications   Medication Sig Start Date End Date Taking? Authorizing Provider  albuterol (VENTOLIN HFA) 108 (90 Base) MCG/ACT inhaler Inhale 2 puffs into the lungs every 6 (six) hours as needed for wheezing or shortness of breath. 03/12/17  Yes Corwin Levins, MD  amLODipine (NORVASC) 5 MG tablet Take 1 tablet (5 mg total) by mouth daily. 04/06/18  Yes Corwin Levins, MD  fluticasone [facility]) 50 MCG/ACT nasal spray  Place 2 sprays into both nostrils daily. 03/12/17 04/28/18 Yes Corwin LevinsJohn, James W, MD  lisinopril (PRINIVIL,ZESTRIL) 20 MG tablet Take 1 tablet (20 mg total) by mouth daily. 04/06/18  Yes Corwin LevinsJohn, James W, MD  pravastatin (PRAVACHOL) 20 MG tablet Take 1 tablet (20 mg total) by mouth daily. 04/06/18  Yes Corwin LevinsJohn, James W, MD  doxycycline (VIBRA-TABS) 100 MG tablet Take 1 tablet (100 mg total) by mouth 2 (two) times daily. Patient not taking: Reported on 04/28/2018 04/06/18   Corwin LevinsJohn, James W, MD  predniSONE  (DELTASONE) 10 MG tablet 3 tabs by mouth per day for 3 days,2tabs per day for 3 days,1tab per day for 3 days Patient not taking: Reported on 04/28/2018 04/06/18   Corwin LevinsJohn, James W, MD  silver sulfADIAZINE (SILVADENE) 1 % cream Apply 1 application topically daily. Patient not taking: Reported on 04/28/2018 11/17/17   Myrlene Brokerrawford, Elizabeth A, MD  temazepam (RESTORIL) 15 MG capsule TAKE  1 CAPSULES BY MOUTH AT BEDTIME AS NEEDED Patient not taking: Reported on 04/28/2018 03/12/17   Corwin LevinsJohn, James W, MD    Current Medications Scheduled Meds: . amLODipine  5 mg Oral Daily  . chlorhexidine  15 mL Mouth Rinse BID  . sennosides  10 mL Per Tube QHS   And  . docusate  100 mg Per Tube QHS  . feeding supplement (ENSURE ENLIVE)  237 mL Oral Q1500  . fluticasone  2 spray Each Nare Daily  . furosemide  80 mg Intravenous Q8H  . heparin  5,000 Units Subcutaneous Q8H  . insulin aspart  0-15 Units Subcutaneous Q4H  . ipratropium-albuterol  3 mL Nebulization TID  . mouth rinse  15 mL Mouth Rinse q12n4p  . metoprolol succinate  50 mg Oral Daily  . pantoprazole  40 mg Oral BID  . sodium chloride flush  10-40 mL Intracatheter Q12H  . sodium phosphate  1 enema Rectal Once   Continuous Infusions: . sodium chloride 250 mL (05/14/18 1211)  . magnesium sulfate 1 - 4 g bolus IVPB     PRN Meds:.sodium chloride, ALPRAZolam, alum & mag hydroxide-simeth, bisacodyl, guaiFENesin-dextromethorphan, hydrALAZINE, levalbuterol, magnesium sulfate 1 - 4 g bolus IVPB, morphine injection, ondansetron, oxyCODONE-acetaminophen, phenol, sodium chloride flush, sodium chloride flush  CBC Recent Labs  Lab 05/15/18 0657 05/16/18 0427 05/19/18 0426  WBC 16.5* 12.8* 10.9*  NEUTROABS 12.7*  --   --   HGB 10.8* 10.1* 10.2*  HCT 33.2* 31.2* 30.7*  MCV 90.2 91.0 88.7  PLT 133* 132* 140*   Basic Metabolic Panel Recent Labs  Lab 05/13/18 0417 05/15/18 0657 05/17/18 0432 05/18/18 0620 05/19/18 0426  NA 147* 143 138 135 134*  K 3.7 4.0  3.8 3.5 3.1*  CL 114* 113* 105 101 97*  CO2 24 24 27 24 27   GLUCOSE 157* 131* 101* 110* 103*  BUN 35* 47* 61* 70* 74*  CREATININE 1.20* 1.30* 1.60* 1.81* 1.94*  CALCIUM 9.0 8.5* 8.5* 8.3* 8.2*  PHOS  --  3.2  --   --   --     Physical Exam  Blood pressure (!) 111/55, pulse 67, temperature 98.1 F (36.7 C), temperature source Oral, resp. rate 17, height 5\' 4"  (1.626 m), weight 89.9 kg, last menstrual period 03/29/1993, SpO2 98 %. GEN: No acute distress ENT: NCAT EYES: EOMI CV: Regular, normal S1 and S2 PULM: Clear bilaterally but diminished in the bases ABD: Midline abdominal bandage intact SKIN: No rashes or lesions EXT: Has upper and lower extremity anasarca including in the presacral area,  4+ in the legs NEURO: Nonfocal   Assessment 80F status post infrarenal abdominal aortic aneurysm rupture and repair on 2/1 with prolonged course complicated by atrial fibrillation, TPN dependence, now with hypervolemia/anasarca and progressive nonoliguric renal failure.  1. AKI, normal baseline serum creatinine, nonoliguric 2. Anasarca, inadequate diuresis on 3 times daily IV Lasix 3. A. fib, now in normal sinus rhythm, not anticoagulated, paroxysmal 4. Hypertension, controlled  Plan 1. Renal ultrasound 2. Check complement levels to evaluate for atheroembolic disease 3. Increase Lasix to 120 mg IV 3 times daily, if fails to improve can consider continuous infusion 4. 1.2L fluid restrictin 5. Daily weights, Daily Renal Panel, Strict I/Os, Avoid nephrotoxins (NSAIDs, judicious IV Contrast)      Sabra Heck MD [phone] pgr 05/19/2018, 3:21 PM

## 2018-05-19 NOTE — Progress Notes (Addendum)
  Progress Note    05/19/2018 7:26 AM 21 Days Post-Op  Subjective:  Pt anxious yesterday-says she felt like it all finally caught up with her.  Says she did not have any pain, sob or any issues except feeling anxious. Xanax helped.  She and her son feel like the swelling is better today.  Says she feels hungry  Afebrile HR 60's-70's NSR 110's-120's systolic 87-96% 2LO2NC  Vitals:   05/19/18 0412 05/19/18 0418  BP: (!) 111/50   Pulse: 70 (!) 49  Resp: 20 20  Temp: 98.3 F (36.8 C)   SpO2: 97% (!) 89%    Physical Exam: General:  No distress Lungs:  Non labored Incisions:  Clean; bandage on distal incision that was changed last night; mild drainage Extremities:  BLE swelling; bilateral feet warm and well perfused. Abdomen:  Distended. NT  CBC    Component Value Date/Time   WBC 10.9 (H) 05/19/2018 0426   RBC 3.46 (L) 05/19/2018 0426   HGB 10.2 (L) 05/19/2018 0426   HCT 30.7 (L) 05/19/2018 0426   PLT 140 (L) 05/19/2018 0426   MCV 88.7 05/19/2018 0426   MCH 29.5 05/19/2018 0426   MCHC 33.2 05/19/2018 0426   RDW 17.2 (H) 05/19/2018 0426   LYMPHSABS 0.8 05/15/2018 0657   MONOABS 2.0 (H) 05/15/2018 0657   EOSABS 0.1 05/15/2018 0657   BASOSABS 0.1 05/15/2018 0657    BMET    Component Value Date/Time   NA 134 (L) 05/19/2018 0426   K 3.1 (L) 05/19/2018 0426   CL 97 (L) 05/19/2018 0426   CO2 27 05/19/2018 0426   GLUCOSE 103 (H) 05/19/2018 0426   BUN 74 (H) 05/19/2018 0426   CREATININE 1.94 (H) 05/19/2018 0426   CALCIUM 8.2 (L) 05/19/2018 0426   GFRNONAA 25 (L) 05/19/2018 0426   GFRAA 29 (L) 05/19/2018 0426    INR    Component Value Date/Time   INR 1.16 04/30/2018 0413     Intake/Output Summary (Last 24 hours) at 05/19/2018 0726 Last data filed at 05/19/2018 0416 Gross per 24 hour  Intake 500 ml  Output 1575 ml  Net -1075 ml     Assessment:  73 y.o. female is s/p:  Repair ruptured AAA 21 Days Post-Op  Plan: -pt continues to tolerate diet.     -creatinine up again today to 1.94; receiving IV lasix q8h.per cardiology.  Swelling in BUE improved today. Continue to monitor renal funcion -po antihypertensive meds started yesterday -DVT prophylaxis:  Sq heparin -continue to mobilize -PT recommending CIR-awaiting CIR recs   Doreatha Massed, PA-C [facility] [phone] 05/19/2018 7:26 AM  I have examined the patient, reviewed and agree with above.  Stable overall.  Severe deconditioning following ruptured aneurysm repair.  Bowels functioning and tolerating increasing diet.  Creatinine increased to 1.9 probably related to diuresis.  We will continue to watch  Gretta Began, MD 05/19/2018 12:14 PM

## 2018-05-20 DIAGNOSIS — I5033 Acute on chronic diastolic (congestive) heart failure: Secondary | ICD-10-CM

## 2018-05-20 LAB — GLUCOSE, CAPILLARY
Glucose-Capillary: 104 mg/dL — ABNORMAL HIGH (ref 70–99)
Glucose-Capillary: 112 mg/dL — ABNORMAL HIGH (ref 70–99)
Glucose-Capillary: 120 mg/dL — ABNORMAL HIGH (ref 70–99)
Glucose-Capillary: 144 mg/dL — ABNORMAL HIGH (ref 70–99)
Glucose-Capillary: 86 mg/dL (ref 70–99)
Glucose-Capillary: 89 mg/dL (ref 70–99)

## 2018-05-20 LAB — CBC
HEMATOCRIT: 29.2 % — AB (ref 36.0–46.0)
Hemoglobin: 9.5 g/dL — ABNORMAL LOW (ref 12.0–15.0)
MCH: 29.2 pg (ref 26.0–34.0)
MCHC: 32.5 g/dL (ref 30.0–36.0)
MCV: 89.8 fL (ref 80.0–100.0)
Platelets: 130 10*3/uL — ABNORMAL LOW (ref 150–400)
RBC: 3.25 MIL/uL — ABNORMAL LOW (ref 3.87–5.11)
RDW: 17.1 % — ABNORMAL HIGH (ref 11.5–15.5)
WBC: 7.5 10*3/uL (ref 4.0–10.5)
nRBC: 0 % (ref 0.0–0.2)

## 2018-05-20 LAB — BASIC METABOLIC PANEL
Anion gap: 10 (ref 5–15)
BUN: 73 mg/dL — ABNORMAL HIGH (ref 8–23)
CO2: 28 mmol/L (ref 22–32)
Calcium: 7.6 mg/dL — ABNORMAL LOW (ref 8.9–10.3)
Chloride: 97 mmol/L — ABNORMAL LOW (ref 98–111)
Creatinine, Ser: 1.96 mg/dL — ABNORMAL HIGH (ref 0.44–1.00)
GFR calc Af Amer: 29 mL/min — ABNORMAL LOW (ref >60)
GFR calc non Af Amer: 25 mL/min — ABNORMAL LOW (ref >60)
Glucose, Bld: 95 mg/dL (ref 70–99)
Potassium: 3.1 mmol/L — ABNORMAL LOW (ref 3.5–5.1)
Sodium: 135 mmol/L (ref 135–145)

## 2018-05-20 LAB — C3 COMPLEMENT: C3 Complement: 120 mg/dL (ref 82–167)

## 2018-05-20 LAB — HEPATIC FUNCTION PANEL
ALK PHOS: 100 U/L (ref 38–126)
ALT: 31 U/L (ref 0–44)
AST: 27 U/L (ref 15–41)
Albumin: 1.8 g/dL — ABNORMAL LOW (ref 3.5–5.0)
Bilirubin, Direct: 0.3 mg/dL — ABNORMAL HIGH (ref 0.0–0.2)
Indirect Bilirubin: 0.8 mg/dL (ref 0.3–0.9)
Total Bilirubin: 1.1 mg/dL (ref 0.3–1.2)
Total Protein: 4.8 g/dL — ABNORMAL LOW (ref 6.5–8.1)

## 2018-05-20 LAB — TSH: TSH: 1.723 u[IU]/mL (ref 0.350–4.500)

## 2018-05-20 LAB — T4, FREE: Free T4: 1.45 ng/dL (ref 0.82–1.77)

## 2018-05-20 LAB — C4 COMPLEMENT: Complement C4, Body Fluid: 19 mg/dL (ref 14–44)

## 2018-05-20 MED ORDER — POTASSIUM CHLORIDE 20 MEQ PO PACK
40.0000 meq | PACK | Freq: Every day | ORAL | Status: DC
  Administered 2018-05-21 (×2): 40 meq via ORAL
  Filled 2018-05-20 (×3): qty 2

## 2018-05-20 NOTE — Progress Notes (Signed)
Admit: 04/28/2018 LOS: 22  39F status post infrarenal abdominal aortic aneurysm rupture and repair on 2/1 with prolonged course complicated by atrial fibrillation, TPN dependence, now with hypervolemia/anasarca and progressive nonoliguric renal failure.  Subjective:  . UOP increased w/ inc dose of lasix . SCr stable, . K 3.1  . NO standing weight this AM, bed weight errroneous . 2/21 Renal US negative for acute structural issues  02/21 0701 - 02/22 0700 In: 610 [P.O.:580; I.V.:30] Out: 2650 [Urine:2650]  Filed Weights   05/19/18 0418 05/20/18 0652 05/20/18 0923  Weight: 89.9 kg 88.3 kg 81.8 kg    Scheduled Meds: . amLODipine  5 mg Oral Daily  . chlorhexidine  15 mL Mouth Rinse BID  . sennosides  10 mL Per Tube QHS   And  . docusate  100 mg Per Tube QHS  . feeding supplement (ENSURE ENLIVE)  237 mL Oral Q1500  . fluticasone  2 spray Each Nare Daily  . furosemide  80 mg Intravenous Q8H  . heparin  5,000 Units Subcutaneous Q8H  . insulin aspart  0-15 Units Subcutaneous Q4H  . ipratropium-albuterol  3 mL Nebulization TID  . mouth rinse  15 mL Mouth Rinse q12n4p  . metoprolol succinate  50 mg Oral Daily  . pantoprazole  40 mg Oral BID  . sodium chloride flush  10-40 mL Intracatheter Q12H   Continuous Infusions: . sodium chloride 250 mL (05/14/18 1211)  . magnesium sulfate 1 - 4 g bolus IVPB     PRN Meds:.sodium chloride, ALPRAZolam, alum & mag hydroxide-simeth, bisacodyl, guaiFENesin-dextromethorphan, hydrALAZINE, levalbuterol, magnesium sulfate 1 - 4 g bolus IVPB, morphine injection, ondansetron, oxyCODONE-acetaminophen, phenol, sodium chloride flush, sodium chloride flush  Current Labs: reviewed    Physical Exam:  Blood pressure (!) 119/57, pulse 69, temperature 98 F (36.7 C), temperature source Oral, resp. rate (!) 22, height 5\' 4"  (1.626 m), weight 81.8 kg, last menstrual period 03/29/1993, SpO2 92 %. GEN: No acute distress ENT: NCAT EYES: EOMI CV: Regular, normal  S1 and S2 PULM: Clear bilaterally but diminished in the bases ABD: Midline abdominal bandage intact SKIN: No rashes or lesions EXT: Has upper and lower extremity anasarca including in the presacral area, 4+ in the legs NEURO: Nonfocal  A 1. AKI, normal baseline serum creatinine, nonoliguric  2/21 Renal US neg  2/14 UA w/o Hb or proteinuria 2. Anasarca, inadequate diuresis on 3 times daily IV Lasix 80 3. A. fib, now in normal sinus rhythm, not anticoagulated, paroxysmal 4. Hypertension, controlled 5. Hypoalbuminemia  P . Cont 120 IV TID lasix, seems to have achieved threshold . Cont 1.2L fliud restriction . Will cont to follow . Medication Issues; o Preferred narcotic agents for pain control are hydromorphone, fentanyl, and methadone. Morphine should not be used.  o Baclofen should be avoided o Avoid oral sodium phosphate and magnesium citrate based laxatives / bowel preps    Sabra Heck MD 05/20/2018, 9:28 AM  Recent Labs  Lab 05/15/18 0657  05/18/18 0620 05/19/18 0426 05/20/18 0539  NA 143   < > 135 134* 135  K 4.0   < > 3.5 3.1* 3.1*  CL 113*   < > 101 97* 97*  CO2 24   < > 24 27 28   GLUCOSE 131*   < > 110* 103* 95  BUN 47*   < > 70* 74* 73*  CREATININE 1.30*   < > 1.81* 1.94* 1.96*  CALCIUM 8.5*   < > 8.3* 8.2* 7.6*  PHOS 3.2  --   --   --   --    < > =  values in this interval not displayed.   Recent Labs  Lab 05/15/18 0657 05/16/18 0427 05/19/18 0426 05/20/18 0539  WBC 16.5* 12.8* 10.9* 7.5  NEUTROABS 12.7*  --   --   --   HGB 10.8* 10.1* 10.2* 9.5*  HCT 33.2* 31.2* 30.7* 29.2*  MCV 90.2 91.0 88.7 89.8  PLT 133* 132* 140* 130*

## 2018-05-20 NOTE — Progress Notes (Addendum)
[facility] [place]  Subjective  - Doing ok over all this am has better outlook and family is presnt.   Objective (!) 119/57 69 98 F (36.7 C) (Oral) (!) 22 92%  Intake/Output Summary (Last 24 hours) at 05/20/2018 0842 Last data filed at 05/20/2018 0826 Gross per 24 hour  Intake 370 ml  Output 3450 ml  Net -3080 ml    Doppler signals DP/PT B brisk, B LE pitting edema. Abdominal incision healing well, mild SS drainage at distal incision. Lungs non labored breathing Abdomin soft NTTP  Assessment/Planning: POD # 22 Repair ruptured AAA  Nephrology consult for AKI with rising Cr 1.96 managing with Lasix daily.  UO  2,650 last 24 hours showing improvement. HGB slight drop from 10.2 to 9.5 in last 24 hours. Will observe, asymptomatic currently.  antihypertensive meds helping Encourage protein intake for healing.   Pending CIR admission secondary to deconditioning.    Mosetta Pigeon 05/20/2018 8:42 AM --  I have interviewed the patient and examined the patient. I agree with the findings by the PA. Her creatinine is stable. Awaiting placement to CIR   Cari Caraway, MD [phone]   Laboratory Lab Results: Recent Labs    05/19/18 0426 05/20/18 0539  WBC 10.9* 7.5  HGB 10.2* 9.5*  HCT 30.7* 29.2*  PLT 140* 130*   BMET Recent Labs    05/19/18 0426 05/20/18 0539  NA 134* 135  K 3.1* 3.1*  CL 97* 97*  CO2 27 28  GLUCOSE 103* 95  BUN 74* 73*  CREATININE 1.94* 1.96*  CALCIUM 8.2* 7.6*    COAG Lab Results  Component Value Date   INR 1.16 04/30/2018   INR 1.33 04/29/2018   INR 1.47 04/29/2018   No results found for: PTT

## 2018-05-20 NOTE — Progress Notes (Signed)
Progress Note  Patient Name: Leah Mata Date of Encounter: 05/20/2018  Primary Cardiologist: Kristeen Miss, MD   Subjective   Still some dyspnea and swelling. No pain.   Inpatient Medications    Scheduled Meds: . amLODipine  5 mg Oral Daily  . chlorhexidine  15 mL Mouth Rinse BID  . sennosides  10 mL Per Tube QHS   And  . docusate  100 mg Per Tube QHS  . feeding supplement (ENSURE ENLIVE)  237 mL Oral Q1500  . fluticasone  2 spray Each Nare Daily  . furosemide  80 mg Intravenous Q8H  . heparin  5,000 Units Subcutaneous Q8H  . insulin aspart  0-15 Units Subcutaneous Q4H  . ipratropium-albuterol  3 mL Nebulization TID  . mouth rinse  15 mL Mouth Rinse q12n4p  . metoprolol succinate  50 mg Oral Daily  . pantoprazole  40 mg Oral BID  . potassium chloride  40 mEq Oral Daily  . sodium chloride flush  10-40 mL Intracatheter Q12H   Continuous Infusions: . sodium chloride 250 mL (05/14/18 1211)  . magnesium sulfate 1 - 4 g bolus IVPB     PRN Meds: sodium chloride, ALPRAZolam, alum & mag hydroxide-simeth, bisacodyl, guaiFENesin-dextromethorphan, hydrALAZINE, levalbuterol, magnesium sulfate 1 - 4 g bolus IVPB, morphine injection, ondansetron, oxyCODONE-acetaminophen, phenol, sodium chloride flush, sodium chloride flush   Vital Signs    Vitals:   05/20/18 0652 05/20/18 0810 05/20/18 0825 05/20/18 0923  BP:   (!) 119/57   Pulse:  72 69   Resp: (!) 23 (!) 22 (!) 22   Temp:   98 F (36.7 C)   TempSrc:   Oral   SpO2:  94% 92%   Weight: 88.3 kg   81.8 kg  Height:        Intake/Output Summary (Last 24 hours) at 05/20/2018 0938 Last data filed at 05/20/2018 6237 Gross per 24 hour  Intake 530 ml  Output 3450 ml  Net -2920 ml   Filed Weights   05/19/18 0418 05/20/18 0652 05/20/18 0923  Weight: 89.9 kg 88.3 kg 81.8 kg    Telemetry    nsr - Personally Reviewed  ECG    none - Personally Reviewed  Physical Exam   GEN: No acute distress.   Neck: 7 cm  JVD Cardiac: RRR, no murmurs, rubs, or gallops.  Respiratory: Clear to auscultation bilaterally. GI: Soft, nontender, non-distended  MS: 2+ edema; No deformity. Neuro:  Nonfocal  Psych: affect blunted   Labs    Chemistry Recent Labs  Lab 05/15/18 0657  05/18/18 0620 05/19/18 0426 05/20/18 0539  NA 143   < > 135 134* 135  K 4.0   < > 3.5 3.1* 3.1*  CL 113*   < > 101 97* 97*  CO2 24   < > 24 27 28   GLUCOSE 131*   < > 110* 103* 95  BUN 47*   < > 70* 74* 73*  CREATININE 1.30*   < > 1.81* 1.94* 1.96*  CALCIUM 8.5*   < > 8.3* 8.2* 7.6*  PROT 4.9*  --   --   --  4.8*  ALBUMIN 2.0*  --   --   --  1.8*  AST 26  --   --   --  27  ALT 27  --   --   --  31  ALKPHOS 116  --   --   --  100  BILITOT 1.7*  --   --   --  1.1  GFRNONAA 41*   < > 28* 25* 25*  GFRAA 48*   < > 32* 29* 29*  ANIONGAP 6   < > 10 10 10    < > = values in this interval not displayed.     Hematology Recent Labs  Lab 05/16/18 0427 05/19/18 0426 05/20/18 0539  WBC 12.8* 10.9* 7.5  RBC 3.43* 3.46* 3.25*  HGB 10.1* 10.2* 9.5*  HCT 31.2* 30.7* 29.2*  MCV 91.0 88.7 89.8  MCH 29.4 29.5 29.2  MCHC 32.4 33.2 32.5  RDW 18.3* 17.2* 17.1*  PLT 132* 140* 130*    Cardiac EnzymesNo results for input(s): TROPONINI in the last 168 hours. No results for input(s): TROPIPOC in the last 168 hours.   BNPNo results for input(s): BNP, PROBNP in the last 168 hours.   DDimer No results for input(s): DDIMER in the last 168 hours.   Radiology    US Renal  Result Date: 05/19/2018 CLINICAL DATA:  Acute kidney injury EXAM: RENAL / URINARY TRACT ULTRASOUND COMPLETE COMPARISON:  CT abdomen pelvis 05/07/2018 FINDINGS: Right Kidney: Renal measurements: 10.0 x 4.6 x 5.2 cm = volume: 125 mL . Echogenicity within normal limits. No mass or hydronephrosis visualized. Left Kidney: Renal measurements: 10.1 x 5.6 x 5.0 = volume: 148 mL. Echogenicity within normal limits. No mass or hydronephrosis visualized. Bladder: Appears normal for  degree of bladder distention. Bilateral pleural effusions. IMPRESSION: Negative renal ultrasound Electronically Signed   By: Marlan Palau M.D.   On: 05/19/2018 17:08    Cardiac Studies   Echo noted  Patient Profile     73 y.o. female admitted with aortic rupture, s/p urgent surgery with open repair and long post op course including volume overload.  Assessment & Plan    1. AAA emergent repair - as per vascular team 2. Atrial fib - she is maintaining NSR.  3. HFPEF - her volume status has been slow to improved. Her weight is down a kg from yesterday. Continue a low sodium diet. 4. AKI from AAA repair - her creatinine is stable at 1.9. Note plans for renal service input.  Leah Mata,M.D.     For questions or updates, please contact [facility] Please consult [email or web address] for contact info under Cardiology/STEMI.      Signed, Leah Bunting, MD  05/20/2018, 9:38 AM  Patient ID: Leah Mata, female   DOB: January 05, 1946, 73 y.o.   MRN: [id number]

## 2018-05-21 DIAGNOSIS — I5043 Acute on chronic combined systolic (congestive) and diastolic (congestive) heart failure: Secondary | ICD-10-CM

## 2018-05-21 LAB — GLUCOSE, CAPILLARY
Glucose-Capillary: 104 mg/dL — ABNORMAL HIGH (ref 70–99)
Glucose-Capillary: 105 mg/dL — ABNORMAL HIGH (ref 70–99)
Glucose-Capillary: 107 mg/dL — ABNORMAL HIGH (ref 70–99)
Glucose-Capillary: 115 mg/dL — ABNORMAL HIGH (ref 70–99)
Glucose-Capillary: 132 mg/dL — ABNORMAL HIGH (ref 70–99)

## 2018-05-21 LAB — BASIC METABOLIC PANEL
Anion gap: 11 (ref 5–15)
BUN: 67 mg/dL — AB (ref 8–23)
CO2: 30 mmol/L (ref 22–32)
Calcium: 8 mg/dL — ABNORMAL LOW (ref 8.9–10.3)
Chloride: 93 mmol/L — ABNORMAL LOW (ref 98–111)
Creatinine, Ser: 1.81 mg/dL — ABNORMAL HIGH (ref 0.44–1.00)
GFR calc Af Amer: 32 mL/min — ABNORMAL LOW (ref >60)
GFR calc non Af Amer: 28 mL/min — ABNORMAL LOW (ref >60)
Glucose, Bld: 109 mg/dL — ABNORMAL HIGH (ref 70–99)
Potassium: 3.1 mmol/L — ABNORMAL LOW (ref 3.5–5.1)
Sodium: 134 mmol/L — ABNORMAL LOW (ref 135–145)

## 2018-05-21 MED ORDER — INSULIN ASPART 100 UNIT/ML ~~LOC~~ SOLN
0.0000 [IU] | Freq: Three times a day (TID) | SUBCUTANEOUS | Status: DC
  Administered 2018-05-21 – 2018-05-29 (×7): 2 [IU] via SUBCUTANEOUS

## 2018-05-21 MED ORDER — POTASSIUM CHLORIDE 20 MEQ PO PACK
40.0000 meq | PACK | Freq: Two times a day (BID) | ORAL | Status: DC
  Administered 2018-05-21: 40 meq via ORAL
  Filled 2018-05-21 (×3): qty 2

## 2018-05-21 NOTE — Progress Notes (Addendum)
[facility] [place]  Subjective  - Doing better sitting up in chair ambulated 3 x yesterday.   Objective (!) 113/57 (!) 116 98.4 F (36.9 C) (Oral) (!) 21 92%  Intake/Output Summary (Last 24 hours) at 05/21/2018 0841 Last data filed at 05/21/2018 0147 Gross per 24 hour  Intake 240 ml  Output 2400 ml  Net -2160 ml    Abdomin soft, tolerating PO's.   Lower abdominal incision with minimal drainage Pitting edema B LE, brisk dopplers DP/PT signals Non labored breathing   Assessment/Planning: POD # 23 Repair ruptured AAA  Patent arterial flow.  BM reported last night ? UO > 3000 for last 24 hours Cr 1.81 trending down 3 times daily IV Lasix 80.  Will continue per Nephrology recommendations.  Baclofen should be avoided Avoid oral sodium phosphate and magnesium citrate based laxatives / bowel preps. Continue to encourage mobility    Mosetta Pigeon 05/21/2018 8:41 AM --  I have interviewed the patient and examined the patient. I agree with the findings by the PA. Steady progress.  Awaiting bed at CIR.  Cari Caraway, MD [phone]   Laboratory Lab Results: Recent Labs    05/19/18 0426 05/20/18 0539  WBC 10.9* 7.5  HGB 10.2* 9.5*  HCT 30.7* 29.2*  PLT 140* 130*   BMET Recent Labs    05/20/18 0539 05/21/18 0421  NA 135 134*  K 3.1* 3.1*  CL 97* 93*  CO2 28 30  GLUCOSE 95 109*  BUN 73* 67*  CREATININE 1.96* 1.81*  CALCIUM 7.6* 8.0*    COAG Lab Results  Component Value Date   INR 1.16 04/30/2018   INR 1.33 04/29/2018   INR 1.47 04/29/2018   No results found for: PTT

## 2018-05-21 NOTE — Progress Notes (Signed)
Admit: 04/28/2018 LOS: 23  72F status post infrarenal abdominal aortic aneurysm rupture and repair on 2/1 with prolonged course complicated by atrial fibrillation, TPN dependence, now with hypervolemia/anasarca and progressive nonoliguric renal failure.  Subjective:  . 3.2L UOP, net neg 2.8L; no weight this AM . SCr stable, . K 3.1  . 2/21 Renal US negative for acute structural issues  02/22 0701 - 02/23 0700 In: 400 [P.O.:400] Out: 3200 [Urine:3200]  Filed Weights   05/19/18 0418 05/20/18 0652 05/20/18 0923  Weight: 89.9 kg 88.3 kg 81.8 kg    Scheduled Meds: . amLODipine  5 mg Oral Daily  . chlorhexidine  15 mL Mouth Rinse BID  . sennosides  10 mL Per Tube QHS   And  . docusate  100 mg Per Tube QHS  . feeding supplement (ENSURE ENLIVE)  237 mL Oral Q1500  . fluticasone  2 spray Each Nare Daily  . furosemide  80 mg Intravenous Q8H  . heparin  5,000 Units Subcutaneous Q8H  . insulin aspart  0-15 Units Subcutaneous TID WC & HS  . ipratropium-albuterol  3 mL Nebulization TID  . mouth rinse  15 mL Mouth Rinse q12n4p  . metoprolol succinate  50 mg Oral Daily  . pantoprazole  40 mg Oral BID  . potassium chloride  40 mEq Oral Daily  . sodium chloride flush  10-40 mL Intracatheter Q12H   Continuous Infusions: . sodium chloride 250 mL (05/14/18 1211)  . magnesium sulfate 1 - 4 g bolus IVPB     PRN Meds:.sodium chloride, ALPRAZolam, alum & mag hydroxide-simeth, bisacodyl, guaiFENesin-dextromethorphan, hydrALAZINE, levalbuterol, magnesium sulfate 1 - 4 g bolus IVPB, morphine injection, ondansetron, oxyCODONE-acetaminophen, phenol, sodium chloride flush, sodium chloride flush  Current Labs: reviewed    Physical Exam:  Blood pressure (!) 113/57, pulse (!) 116, temperature 98.4 F (36.9 C), temperature source Oral, resp. rate (!) 21, height 5\' 4"  (1.626 m), weight 81.8 kg, last menstrual period 03/29/1993, SpO2 92 %. GEN: No acute distress ENT: NCAT EYES: EOMI CV: Regular, normal  S1 and S2 PULM: Clear bilaterally but diminished in the bases ABD: Midline abdominal bandage intact SKIN: No rashes or lesions EXT: Has upper and lower extremity anasarca including in the presacral area, 4+ in the legs NEURO: Nonfocal  A 1. AKI, normal baseline serum creatinine, nonoliguric  2/21 Renal US neg  2/14 UA w/o Hb or proteinuria 2. Anasarca, inadequate diuresis on 3 times daily IV Lasix 80; due to #1 and #5 3. A. fib, now in normal sinus rhythm, not anticoagulated, paroxysmal 4. Hypertension, controlled 5. Hypoalbuminemia 6. Hypokalemia 2/2 diuretics 7. S/p infrarenal AAA repair  P . Cont 120 IV TID lasix, seems to have achieved threshold . Cont 1.2L fliud restriction . Inc KCl to 40 BID . Will cont to follow . Medication Issues; o Preferred narcotic agents for pain control are hydromorphone, fentanyl, and methadone. Morphine should not be used.  o Baclofen should be avoided o Avoid oral sodium phosphate and magnesium citrate based laxatives / bowel preps    Sabra Heck MD 05/21/2018, 10:13 AM  Recent Labs  Lab 05/15/18 0657  05/19/18 0426 05/20/18 0539 05/21/18 0421  NA 143   < > 134* 135 134*  K 4.0   < > 3.1* 3.1* 3.1*  CL 113*   < > 97* 97* 93*  CO2 24   < > 27 28 30   GLUCOSE 131*   < > 103* 95 109*  BUN 47*   < > 74*  73* 67*  CREATININE 1.30*   < > 1.94* 1.96* 1.81*  CALCIUM 8.5*   < > 8.2* 7.6* 8.0*  PHOS 3.2  --   --   --   --    < > = values in this interval not displayed.   Recent Labs  Lab 05/15/18 0657 05/16/18 0427 05/19/18 0426 05/20/18 0539  WBC 16.5* 12.8* 10.9* 7.5  NEUTROABS 12.7*  --   --   --   HGB 10.8* 10.1* 10.2* 9.5*  HCT 33.2* 31.2* 30.7* 29.2*  MCV 90.2 91.0 88.7 89.8  PLT 133* 132* 140* 130*

## 2018-05-21 NOTE — Progress Notes (Signed)
Progress Note  Patient Name: Leah Mata Date of Encounter: 05/21/2018  Primary Cardiologist: Leah Miss, MD   Subjective   Dyspnea about the same. No chest pain.   Inpatient Medications    Scheduled Meds: . amLODipine  5 mg Oral Daily  . chlorhexidine  15 mL Mouth Rinse BID  . sennosides  10 mL Per Tube QHS   And  . docusate  100 mg Per Tube QHS  . feeding supplement (ENSURE ENLIVE)  237 mL Oral Q1500  . fluticasone  2 spray Each Nare Daily  . furosemide  80 mg Intravenous Q8H  . heparin  5,000 Units Subcutaneous Q8H  . insulin aspart  0-15 Units Subcutaneous TID WC & HS  . ipratropium-albuterol  3 mL Nebulization TID  . mouth rinse  15 mL Mouth Rinse q12n4p  . metoprolol succinate  50 mg Oral Daily  . pantoprazole  40 mg Oral BID  . potassium chloride  40 mEq Oral Daily  . sodium chloride flush  10-40 mL Intracatheter Q12H   Continuous Infusions: . sodium chloride 250 mL (05/14/18 1211)  . magnesium sulfate 1 - 4 g bolus IVPB     PRN Meds: sodium chloride, ALPRAZolam, alum & mag hydroxide-simeth, bisacodyl, guaiFENesin-dextromethorphan, hydrALAZINE, levalbuterol, magnesium sulfate 1 - 4 g bolus IVPB, morphine injection, ondansetron, oxyCODONE-acetaminophen, phenol, sodium chloride flush, sodium chloride flush   Vital Signs    Vitals:   05/20/18 2048 05/21/18 0018 05/21/18 0429 05/21/18 0839  BP:  114/70 (!) 113/57   Pulse:  (!) 116 (!) 116   Resp:  (!) 21    Temp:  (!) 97.5 F (36.4 C) 98.4 F (36.9 C)   TempSrc:  Oral Oral   SpO2: 96% 97% 95% 92%  Weight:      Height:        Intake/Output Summary (Last 24 hours) at 05/21/2018 0911 Last data filed at 05/21/2018 0147 Gross per 24 hour  Intake 240 ml  Output 2300 ml  Net -2060 ml   Filed Weights   05/19/18 0418 05/20/18 0652 05/20/18 0923  Weight: 89.9 kg 88.3 kg 81.8 kg    Telemetry    nsr - Personally Reviewed  ECG    none - Personally Reviewed  Physical Exam   GEN: No acute  distress.   Neck: No JVD Cardiac: RRR, no murmurs, rubs, or gallops.  Respiratory: Clear to auscultation bilaterally. GI: Soft, nontender, non-distended  MS: No edema; No deformity. Neuro:  Nonfocal  Psych: Normal affect   Labs    Chemistry Recent Labs  Lab 05/15/18 0657  05/19/18 0426 05/20/18 0539 05/21/18 0421  NA 143   < > 134* 135 134*  K 4.0   < > 3.1* 3.1* 3.1*  CL 113*   < > 97* 97* 93*  CO2 24   < > 27 28 30   GLUCOSE 131*   < > 103* 95 109*  BUN 47*   < > 74* 73* 67*  CREATININE 1.30*   < > 1.94* 1.96* 1.81*  CALCIUM 8.5*   < > 8.2* 7.6* 8.0*  PROT 4.9*  --   --  4.8*  --   ALBUMIN 2.0*  --   --  1.8*  --   AST 26  --   --  27  --   ALT 27  --   --  31  --   ALKPHOS 116  --   --  100  --   BILITOT 1.7*  --   --  1.1  --   GFRNONAA 41*   < > 25* 25* 28*  GFRAA 48*   < > 29* 29* 32*  ANIONGAP 6   < > 10 10 11    < > = values in this interval not displayed.     Hematology Recent Labs  Lab 05/16/18 0427 05/19/18 0426 05/20/18 0539  WBC 12.8* 10.9* 7.5  RBC 3.43* 3.46* 3.25*  HGB 10.1* 10.2* 9.5*  HCT 31.2* 30.7* 29.2*  MCV 91.0 88.7 89.8  MCH 29.4 29.5 29.2  MCHC 32.4 33.2 32.5  RDW 18.3* 17.2* 17.1*  PLT 132* 140* 130*    Cardiac EnzymesNo results for input(s): TROPONINI in the last 168 hours. No results for input(s): TROPIPOC in the last 168 hours.   BNPNo results for input(s): BNP, PROBNP in the last 168 hours.   DDimer No results for input(s): DDIMER in the last 168 hours.   Radiology    US Renal  Result Date: 05/19/2018 CLINICAL DATA:  Acute kidney injury EXAM: RENAL / URINARY TRACT ULTRASOUND COMPLETE COMPARISON:  CT abdomen pelvis 05/07/2018 FINDINGS: Right Kidney: Renal measurements: 10.0 x 4.6 x 5.2 cm = volume: 125 mL . Echogenicity within normal limits. No mass or hydronephrosis visualized. Left Kidney: Renal measurements: 10.1 x 5.6 x 5.0 = volume: 148 mL. Echogenicity within normal limits. No mass or hydronephrosis visualized. Bladder:  Appears normal for degree of bladder distention. Bilateral pleural effusions. IMPRESSION: Negative renal ultrasound Electronically Signed   By: Leah Mata M.D.   On: 05/19/2018 17:08    Cardiac Studies   none  Patient Profile     73 y.o. female admitted with a ruptured AAA, s/p emergent surgery  Assessment & Plan    1. Ruptured AAA, s/p emergent repair - as per vascular team. Appears stable. 2. Atrial fib, post op - she is maintaining NSR. Continue supportive care. 3. Diastolic CHF - she looks to be close to euvolemic. No weight today. Her urine output is good.  4. AKI - her renal function is stable with creatinine of 1.8.  5. Hypokalemia - replete.      For questions or updates, please contact [facility] Please consult [email or web address] for contact info under Cardiology/STEMI.      Signed, Leah Bunting, MD  05/21/2018, 9:11 AM  Patient ID: Leah Mata, female   DOB: [birth year], 73 y.o.   MRN: [id number]

## 2018-05-22 DIAGNOSIS — J449 Chronic obstructive pulmonary disease, unspecified: Secondary | ICD-10-CM

## 2018-05-22 DIAGNOSIS — I483 Typical atrial flutter: Secondary | ICD-10-CM

## 2018-05-22 LAB — BASIC METABOLIC PANEL
Anion gap: 7 (ref 5–15)
BUN: 55 mg/dL — ABNORMAL HIGH (ref 8–23)
CO2: 30 mmol/L (ref 22–32)
Calcium: 7.8 mg/dL — ABNORMAL LOW (ref 8.9–10.3)
Chloride: 98 mmol/L (ref 98–111)
Creatinine, Ser: 1.56 mg/dL — ABNORMAL HIGH (ref 0.44–1.00)
GFR calc Af Amer: 38 mL/min — ABNORMAL LOW (ref >60)
GFR calc non Af Amer: 33 mL/min — ABNORMAL LOW (ref >60)
Glucose, Bld: 97 mg/dL (ref 70–99)
Potassium: 3.5 mmol/L (ref 3.5–5.1)
Sodium: 135 mmol/L (ref 135–145)

## 2018-05-22 LAB — GLUCOSE, CAPILLARY
Glucose-Capillary: 94 mg/dL (ref 70–99)
Glucose-Capillary: 101 mg/dL — ABNORMAL HIGH (ref 70–99)
Glucose-Capillary: 100 mg/dL — ABNORMAL HIGH (ref 70–99)
Glucose-Capillary: 114 mg/dL — ABNORMAL HIGH (ref 70–99)

## 2018-05-22 MED ORDER — METOPROLOL SUCCINATE ER 50 MG PO TB24
50.0000 mg | ORAL_TABLET | Freq: Once | ORAL | Status: AC
  Administered 2018-05-22: 50 mg via ORAL
  Filled 2018-05-22: qty 1

## 2018-05-22 MED ORDER — METOPROLOL SUCCINATE ER 100 MG PO TB24
100.0000 mg | ORAL_TABLET | Freq: Every day | ORAL | Status: DC
  Administered 2018-05-23: 100 mg via ORAL
  Filled 2018-05-22: qty 1

## 2018-05-22 MED ORDER — APIXABAN 5 MG PO TABS
5.0000 mg | ORAL_TABLET | Freq: Two times a day (BID) | ORAL | Status: DC
  Administered 2018-05-22 – 2018-05-23 (×4): 5 mg via ORAL
  Filled 2018-05-22 (×4): qty 1

## 2018-05-22 MED ORDER — POTASSIUM CHLORIDE CRYS ER 20 MEQ PO TBCR
40.0000 meq | EXTENDED_RELEASE_TABLET | Freq: Two times a day (BID) | ORAL | Status: DC
  Administered 2018-05-22 – 2018-05-30 (×12): 40 meq via ORAL
  Filled 2018-05-22 (×12): qty 2

## 2018-05-22 NOTE — Progress Notes (Signed)
IP rehab admissions - Patient has progressed well and PT/OT now recommending home with [facility] therapies.  She will not need an acute inpatient rehab stay.  I will sign off for inpatient rehab at this time.  [phone]

## 2018-05-22 NOTE — Progress Notes (Signed)
Progress Note  Patient Name: Leah Mata Date of Encounter: 05/22/2018  Primary Cardiologist: Kristeen Miss, MD   Subjective   Denies dyspnea or abdominal pain.  Never complained of angina.  Currently in persistent atrial flutter with 2: 1 AV block and a ventricular rate of 120 bpm.  She is unaware of the arrhythmia.  Inpatient Medications    Scheduled Meds: . amLODipine  5 mg Oral Daily  . chlorhexidine  15 mL Mouth Rinse BID  . sennosides  10 mL Per Tube QHS   And  . docusate  100 mg Per Tube QHS  . feeding supplement (ENSURE ENLIVE)  237 mL Oral Q1500  . fluticasone  2 spray Each Nare Daily  . furosemide  80 mg Intravenous Q8H  . heparin  5,000 Units Subcutaneous Q8H  . insulin aspart  0-15 Units Subcutaneous TID WC & HS  . ipratropium-albuterol  3 mL Nebulization TID  . mouth rinse  15 mL Mouth Rinse q12n4p  . metoprolol succinate  50 mg Oral Daily  . pantoprazole  40 mg Oral BID  . potassium chloride  40 mEq Oral BID  . sodium chloride flush  10-40 mL Intracatheter Q12H   Continuous Infusions: . sodium chloride 250 mL (05/14/18 1211)  . magnesium sulfate 1 - 4 g bolus IVPB     PRN Meds: sodium chloride, ALPRAZolam, alum & mag hydroxide-simeth, bisacodyl, guaiFENesin-dextromethorphan, hydrALAZINE, levalbuterol, magnesium sulfate 1 - 4 g bolus IVPB, morphine injection, ondansetron, oxyCODONE-acetaminophen, phenol, sodium chloride flush, sodium chloride flush   Vital Signs    Vitals:   05/21/18 2104 05/22/18 0647 05/22/18 0800 05/22/18 0857  BP: 109/60 120/70 120/69   Pulse:  (!) 112    Resp: 18 (!) 23 (!) 21   Temp: 98.3 F (36.8 C) (!) 97.4 F (36.3 C) 98.5 F (36.9 C)   TempSrc: Oral Oral Oral   SpO2:  96%  95%  Weight:  86 kg    Height:        Intake/Output Summary (Last 24 hours) at 05/22/2018 1025 Last data filed at 05/22/2018 0859 Gross per 24 hour  Intake 480 ml  Output 2100 ml  Net -1620 ml   Last 3 Weights 05/22/2018 05/21/2018 05/20/2018    Weight (lbs) 189 lb 9.5 oz 188 lb 1.6 oz 180 lb 5.4 oz  Weight (kg) 86 kg 85.322 kg 81.8 kg      Telemetry    Atrial flutter with 2: 1 AV block, appears to be typical atrial flutter- Personally Reviewed  ECG    Previous ECG from earlier this month clearly showed atrial fibrillation with rapid ventricular response at 145 bpm.- Personally Reviewed  Physical Exam  Appears comfortable GEN: No acute distress.   Neck: No JVD Cardiac: RRR, tachycardia, no murmurs, rubs, or gallops.  Respiratory:  Diminished breath sounds throughout with prolonged expiration but without active wheezing. GI: Soft, nontender, non-distended  MS: No edema; No deformity. Neuro:  Nonfocal  Psych: Normal affect   Labs    Chemistry Recent Labs  Lab 05/20/18 0539 05/21/18 0421 05/22/18 0500  NA 135 134* 135  K 3.1* 3.1* 3.5  CL 97* 93* 98  CO2 28 30 30   GLUCOSE 95 109* 97  BUN 73* 67* 55*  CREATININE 1.96* 1.81* 1.56*  CALCIUM 7.6* 8.0* 7.8*  PROT 4.8*  --   --   ALBUMIN 1.8*  --   --   AST 27  --   --   ALT 31  --   --  ALKPHOS 100  --   --   BILITOT 1.1  --   --   GFRNONAA 25* 28* 33*  GFRAA 29* 32* 38*  ANIONGAP 10 11 7      Hematology Recent Labs  Lab 05/16/18 0427 05/19/18 0426 05/20/18 0539  WBC 12.8* 10.9* 7.5  RBC 3.43* 3.46* 3.25*  HGB 10.1* 10.2* 9.5*  HCT 31.2* 30.7* 29.2*  MCV 91.0 88.7 89.8  MCH 29.4 29.5 29.2  MCHC 32.4 33.2 32.5  RDW 18.3* 17.2* 17.1*  PLT 132* 140* 130*    Cardiac EnzymesNo results for input(s): TROPONINI in the last 168 hours. No results for input(s): TROPIPOC in the last 168 hours.   BNPNo results for input(s): BNP, PROBNP in the last 168 hours.   DDimer No results for input(s): DDIMER in the last 168 hours.   Radiology    No results found.  Cardiac Studies   Echo May 03, 2018  1. The left ventricle has hyperdynamic systolic function of >65%. The cavity size is normal. There is no increased left ventricular wall thickness. Echo  evidence of impaired diastolic relaxation.  2. The right ventricle has normal systolic function. The cavity in normal in size. There is no increase in right ventricular wall thickness.  3. The mitral valve is normal in structure.  4. The aortic valve has an indeterminant number of cusps.  5. The pulmonic valve is Not well seen. Pulmonic valve regurgitation was not assessed by color flow Doppler.  6. There is mild dilatation of the aortic root.  7. The interatrial septum was not assessed.  8. Extremely limited due to poor sound wave transmission; definity used; vigorous LV systolic function; mild diastolic dysfunction.   Patient Profile     73 y.o. female status post emergency repair for ruptured abdominal aortic aneurysm, postop course complicated by atrial fibrillation with rapid ventricular response and now atrial flutter with 2:1 AV block  Assessment & Plan    1.  Aflutter: Increase metoprolol to 50 mg twice daily and stop amlodipine.  If the arrhythmia does not convert spontaneously, atrial flutter may be very difficult to rate control and may require cardioversion before discharge.  Need to start anticoagulation when okay with with the vascular surgery team. If we need to perform DCCV, needs to be preceded by at least 3 doses of anticoagulation and TEE. 2. CHF: Despite tachycardia has no signs or symptoms of heart failure and clinically appears to be euvolemic. 3. CKD: Creatinine has improved further down to 1.56.  She would be a good candidate for Eliquis 5 mg twice daily. 4. COPD: He has decided to quit smoking permanently.  Underlying chronic lung disease may be the substrate for her tendency to have atrial flutter.     For questions or updates, please contact [facility] Please consult [email or web address] for contact info under        Signed, Thurmon Fair, MD  05/22/2018, 10:25 AM

## 2018-05-22 NOTE — Progress Notes (Signed)
Occupational Therapy Treatment Patient Details Name: Leah Mata MRN: [id number] DOB: 26-Feb-1946 Today's Date: 05/22/2018    History of present illness Pt is a 73 y.o. F with significant PMH of COPD who presents with abdominal and right flank pain. 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 S/p aneurysm abdominal aortic repair. Hospitalization complicated by worsening SOB requiring bipap on 2/4 due to pulmonary effusion, tachycardia 2/5 requiring amiodorone drip.   OT comments  Pt progressing in activity tolerance and ADL. Practiced use of AE for LOB dressing, toileted with min assist to stand and manage clothing, stood at sink for grooming activity x 1. Pt with HR in 120's with ambulation, cardiology leaving room at time of entrance and aware. Updated d/c disposition to home with [facility] and support of her family.  Follow Up Recommendations  Home health OT    Equipment Recommendations  Tub/shower seat;3 in 1 bedside commode    Recommendations for Other Services      Precautions / Restrictions Precautions Precautions: Fall Precaution Comments: watch HR, MD anticipates it staying in mid 120s Restrictions Weight Bearing Restrictions: No       Mobility Bed Mobility               General bed mobility comments: received in chair  Transfers Overall transfer level: Needs assistance Equipment used: 4-wheeled walker Transfers: Sit to/from Stand Sit to Stand: Supervision;Min assist         General transfer comment: supervision from recliner, min assist from toilet    Balance Overall balance assessment: Needs assistance Sitting-balance support: Feet supported Sitting balance-[name]: Good     Standing balance support: No upper extremity supported;During functional activity Standing balance-[name]: Fair Standing balance comment: able to release UE support briefly at sink                           ADL either performed or assessed with clinical judgement   ADL Overall ADL's : Needs assistance/impaired     Grooming: Brushing hair;Standing;Supervision/safety           Upper Body Dressing : Set up;Sitting Upper Body Dressing Details (indicate cue type and reason): to don front opening gown Lower Body Dressing: Set up;Sitting/lateral leans Lower Body Dressing Details (indicate cue type and reason): educated/practiced use of reacher and sock aid Toilet Transfer: Ambulation;RW;Regular Toilet;Minimal assistance;Grab bars   Toileting- Clothing Manipulation and Hygiene: Minimal assistance;Sit to/from stand Toileting - Clothing Manipulation Details (indicate cue type and reason): assist to keep gowns out of toilet     Functional mobility during ADLs: Min guard;Rolling walker       Vision       Perception     Praxis      Cognition Arousal/Alertness: Awake/alert Behavior During Therapy: WFL for tasks assessed/performed Overall Cognitive Status: Within Functional Limits for tasks assessed                                 General Comments: pt with improved motivation, although aggravated with continued medical issues        Exercises   Shoulder Instructions       General Comments      Pertinent Vitals/ Pain       Pain Assessment: No/denies pain Faces Pain Scale: No hurt  Home Living  Prior Functioning/Environment              Frequency  Min 2X/week        Progress Toward Goals  OT Goals(current goals can now be found in the care plan section)  Progress towards OT goals: Progressing toward goals  Acute Rehab OT Goals Patient Stated Goal: to get home OT Goal Formulation: With patient Time For Goal Achievement: 05/31/18 Potential to Achieve Goals: Good  Plan Discharge plan needs to be updated    Co-evaluation                 AM-PAC OT "6 Clicks" Daily  Activity     Outcome Measure   Help from another person eating meals?: None Help from another person taking care of personal grooming?: A Little Help from another person toileting, which includes using toliet, bedpan, or urinal?: A Little Help from another person bathing (including washing, rinsing, drying)?: A Lot Help from another person to put on and taking off regular upper body clothing?: A Little Help from another person to put on and taking off regular lower body clothing?: A Lot 6 Click Score: 17    End of Session Equipment Utilized During Treatment: Gait belt;Rolling walker  OT Visit Diagnosis: Unsteadiness on feet (R26.81);Other abnormalities of gait and mobility (R26.89);Muscle weakness (generalized) (M62.81);Pain   Activity Tolerance Patient tolerated treatment well   Patient Left in chair;with call bell/phone within reach;with family/visitor present   Nurse Communication          Time: 9021-1155 OT Time Calculation (min): 17 min  Charges: OT General Charges $OT Visit: 1 Visit OT Treatments $Self Care/Home Management : 8-22 mins  Martie Round, OTR/L Acute Rehabilitation Services Pager: [phone] Office: [phone]  Evern Bio 05/22/2018, 11:40 AM

## 2018-05-22 NOTE — Progress Notes (Addendum)
[facility]  Subjective  - Doping better, feels more like herself.   Objective 120/69 (!) 112 98.5 F (36.9 C) (Oral) (!) 21 96%  Intake/Output Summary (Last 24 hours) at 05/22/2018 0803 Last data filed at 05/21/2018 2104 Gross per 24 hour  Intake 320 ml  Output 1350 ml  Net -1030 ml    Palpable DP pulse B LE still with moderated LE edema. Abdomin soft incision healing well without further drainage.  No dressing at this time. Heart RRR Lungs non labored breathing    Assessment/Planning: POD # 24 Repair ruptured AAA  [facility] Nephrology following lasix 120 TID UO 1350 last 24 hours, Cr 1.56  Heart RRR now, A fib post op Pending CIR   Leah Mata 05/22/2018 8:03 AM --  I have interviewed the patient and examined the patient. I agree with the findings by the PA. The patient went into a flutter although is asymptomatic.  I discussed the case with Dr. Royann Shivers who has started the patient on Eliquis. Awaiting CIR bed.  Cari Caraway, MD [phone]   Laboratory Lab Results: Recent Labs    05/20/18 0539  WBC 7.5  HGB 9.5*  HCT 29.2*  PLT 130*   BMET Recent Labs    05/21/18 0421 05/22/18 0500  NA 134* 135  K 3.1* 3.5  CL 93* 98  CO2 30 30  GLUCOSE 109* 97  BUN 67* 55*  CREATININE 1.81* 1.56*  CALCIUM 8.0* 7.8*    COAG Lab Results  Component Value Date   INR 1.16 04/30/2018   INR 1.33 04/29/2018   INR 1.47 04/29/2018   No results found for: PTT

## 2018-05-22 NOTE — Plan of Care (Signed)
  Problem: Education: Goal: Understanding of CV disease, CV risk reduction, and recovery process will improve Outcome: Progressing   Problem: Education: Goal: Individualized Educational Video(s) Outcome: Progressing   

## 2018-05-22 NOTE — Progress Notes (Signed)
Patient ambulated three times in the hall on this shift, ambulation well tolerated will monitor.  

## 2018-05-22 NOTE — Care Management (Signed)
#  8.   S/ W   ROSEMARY @ [facility] RX # [phone]   ELIQUIS  5 MG BID COVER- YES CO-PAY- $ 305.50 TIER- 3 DRUG PRIOR APPROVAL- NO  DEDUCTIBLE : NOT MET  PREFERRED PHARMACY : YES [facility]

## 2018-05-22 NOTE — Progress Notes (Signed)
Physical Therapy Treatment Patient Details Name: Leah Mata MRN: [id number] DOB: 1945-09-22 Today's Date: 05/22/2018    History of Present Illness Pt is a 73 y.o. F with significant PMH of COPD who presents with abdominal and right flank pain. 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 S/p aneurysm abdominal aortic repair. Hospitalization complicated by worsening SOB requiring bipap on 2/4 due to pulmonary effusion, tachycardia 2/5 requiring amiodorone drip.    PT Comments    Patient seen for activity progression. Tolerated session well with good progress towards PT goals. Patient has ambulated in halls with family over the weekend and has progressed to supervision levels with rollator during this session. Given progression, feel patient would be appropriate for HHPT services upon acute discharge. Recommendations updated. Will continue to see and progress as tolerated.  Follow Up Recommendations  Home health PT;Supervision/Assistance - 24 hour(initially)     Equipment Recommendations  3in1 (PT);Other (comment)(rollator 4 wheels )    Recommendations for Other Services       Precautions / Restrictions Precautions Precautions: Fall Precaution Comments: HR  Restrictions Weight Bearing Restrictions: No    Mobility  Bed Mobility               General bed mobility comments: received in chair  Transfers Overall transfer level: Needs assistance Equipment used: 4-wheeled walker Transfers: Sit to/from Stand Sit to Stand: Supervision         General transfer comment: supervision for safety  Ambulation/Gait Ambulation/Gait assistance: Supervision Gait Distance (Feet): 310 Feet Assistive device: 4-wheeled walker Gait Pattern/deviations: Step-through pattern;Decreased stride length;Wide base of support;Trunk flexed Gait velocity: decreased Gait velocity interpretation: <1.8 ft/sec, indicate of risk for  recurrent falls General Gait Details: Supervision for safety and mobility, HR remained mid 120s with ambulation and activity   Stairs             Wheelchair Mobility    Modified Rankin (Stroke Patients Only)       Balance Overall balance assessment: Needs assistance Sitting-balance support: Feet supported Sitting balance-[name]: Good     Standing balance support: No upper extremity supported;During functional activity Standing balance-[name]: Fair Standing balance comment: able to release UE support breifly                            Cognition Arousal/Alertness: Awake/alert Behavior During Therapy: WFL for tasks assessed/performed Overall Cognitive Status: Within Functional Limits for tasks assessed                                 General Comments: pt and family very cooperative      Exercises General Exercises - Lower Extremity Ankle Circles/Pumps: AROM;Both Long Arc Quad: AROM;Both    General Comments        Pertinent Vitals/Pain Pain Assessment: No/denies pain Faces Pain Scale: No hurt    Home Living                      Prior Function            PT Goals (current goals can now be found in the care plan section) Acute Rehab PT Goals Patient Stated Goal: to get home PT Goal Formulation: With patient Time For Goal Achievement: 05/26/18 Potential to Achieve Goals: Good Progress towards PT goals: Progressing toward goals  Frequency    Min 3X/week      PT Plan Discharge plan needs to be updated    Co-evaluation PT/OT/SLP Co-Evaluation/Treatment: (dove tailed)            AM-PAC PT "6 Clicks" Mobility   Outcome Measure  Help needed turning from your back to your side while in a flat bed without using bedrails?: A Little Help needed moving from lying on your back to sitting on the side of a flat bed without using bedrails?: A Little Help needed moving to and from a bed to a chair  (including a wheelchair)?: A Little Help needed standing up from a chair using your arms (e.g., wheelchair or bedside chair)?: A Little Help needed to walk in hospital room?: A Little Help needed climbing 3-5 steps with a railing? : A Lot 6 Click Score: 17    End of Session Equipment Utilized During Treatment: Oxygen Activity Tolerance: Patient tolerated treatment well Patient left: in bed;with call bell/phone within reach;with family/visitor present Nurse Communication: Mobility status PT Visit Diagnosis: Unsteadiness on feet (R26.81);Muscle weakness (generalized) (M62.81)     Time: 8185-6314 PT Time Calculation (min) (ACUTE ONLY): 18 min  Charges:  $Gait Training: 8-22 mins                     Charlotte Crumb, PT DPT  Board Certified Neurologic Specialist Acute Rehabilitation Services Pager [phone] Office [phone]    Fabio Asa 05/22/2018, 10:53 AM

## 2018-05-22 NOTE — Progress Notes (Signed)
Patient ID: Leah Mata, female   DOB: [birth year], 73 y.o.   MRN: [id number] S: no new complaints O:BP 107/69 (BP Location: Right Leg)   Pulse (!) 122   Temp 98.4 F (36.9 C) (Oral)   Resp (!) 22   Ht 5\' 4"  (1.626 m)   Wt 86 kg   LMP 03/29/1993 (Approximate)   SpO2 93%   BMI 32.54 kg/m   Intake/Output Summary (Last 24 hours) at 05/22/2018 1454 Last data filed at 05/22/2018 0859 Gross per 24 hour  Intake 360 ml  Output 1600 ml  Net -1240 ml   Intake/Output: I/O last 3 completed shifts: In: 500 [P.O.:500] Out: 3250 [Urine:3250]  Intake/Output this shift:  Total I/O In: 160 [P.O.:160] Out: 750 [Urine:750] Weight change: 4.2 kg Gen: WD WN in NAD CVS: tachy, no rub Resp: cta IRS:WNIOE, +BS, soft, NT Ext: 1+ edema  Recent Labs  Lab 05/17/18 0432 05/18/18 0620 05/19/18 0426 05/20/18 0539 05/21/18 0421 05/22/18 0500  NA 138 135 134* 135 134* 135  K 3.8 3.5 3.1* 3.1* 3.1* 3.5  CL 105 101 97* 97* 93* 98  CO2 27 24 27 28 30 30   GLUCOSE 101* 110* 103* 95 109* 97  BUN 61* 70* 74* 73* 67* 55*  CREATININE 1.60* 1.81* 1.94* 1.96* 1.81* 1.56*  ALBUMIN  --   --   --  1.8*  --   --   CALCIUM 8.5* 8.3* 8.2* 7.6* 8.0* 7.8*  AST  --   --   --  27  --   --   ALT  --   --   --  31  --   --    Liver Function Tests: Recent Labs  Lab 05/20/18 0539  AST 27  ALT 31  ALKPHOS 100  BILITOT 1.1  PROT 4.8*  ALBUMIN 1.8*   No results for input(s): LIPASE, AMYLASE in the last 168 hours. No results for input(s): AMMONIA in the last 168 hours. CBC: Recent Labs  Lab 05/16/18 0427 05/19/18 0426 05/20/18 0539  WBC 12.8* 10.9* 7.5  HGB 10.1* 10.2* 9.5*  HCT 31.2* 30.7* 29.2*  MCV 91.0 88.7 89.8  PLT 132* 140* 130*   Cardiac Enzymes: No results for input(s): CKTOTAL, CKMB, CKMBINDEX, TROPONINI in the last 168 hours. CBG: Recent Labs  Lab 05/21/18 1114 05/21/18 1617 05/21/18 2027 05/22/18 0606 05/22/18 1131  GLUCAP 107* 105* 132* 94 101*    Iron Studies: No results for  input(s): IRON, TIBC, TRANSFERRIN, FERRITIN in the last 72 hours. Studies/Results: No results found. Marland Kitchen apixaban  5 mg Oral BID  . chlorhexidine  15 mL Mouth Rinse BID  . sennosides  10 mL Per Tube QHS   And  . docusate  100 mg Per Tube QHS  . feeding supplement (ENSURE ENLIVE)  237 mL Oral Q1500  . fluticasone  2 spray Each Nare Daily  . furosemide  80 mg Intravenous Q8H  . insulin aspart  0-15 Units Subcutaneous TID WC & HS  . ipratropium-albuterol  3 mL Nebulization TID  . mouth rinse  15 mL Mouth Rinse q12n4p  . [START ON 05/23/2018] metoprolol succinate  100 mg Oral Daily  . pantoprazole  40 mg Oral BID  . potassium chloride  40 mEq Oral BID  . sodium chloride flush  10-40 mL Intracatheter Q12H    BMET    Component Value Date/Time   NA 135 05/22/2018 0500   K 3.5 05/22/2018 0500   CL 98 05/22/2018 0500  CO2 30 05/22/2018 0500   GLUCOSE 97 05/22/2018 0500   BUN 55 (H) 05/22/2018 0500   CREATININE 1.56 (H) 05/22/2018 0500   CALCIUM 7.8 (L) 05/22/2018 0500   GFRNONAA 33 (L) 05/22/2018 0500   GFRAA 38 (L) 05/22/2018 0500   CBC    Component Value Date/Time   WBC 7.5 05/20/2018 0539   RBC 3.25 (L) 05/20/2018 0539   HGB 9.5 (L) 05/20/2018 0539   HCT 29.2 (L) 05/20/2018 0539   PLT 130 (L) 05/20/2018 0539   MCV 89.8 05/20/2018 0539   MCH 29.2 05/20/2018 0539   MCHC 32.5 05/20/2018 0539   RDW 17.1 (H) 05/20/2018 0539   LYMPHSABS 0.8 05/15/2018 0657   MONOABS 2.0 (H) 05/15/2018 0657   EOSABS 0.1 05/15/2018 0657   BASOSABS 0.1 05/15/2018 0657     Assessment/Plan:  1. AKI in setting of AAA rupture and ABLA/hemorrhagic shock.  Cr peaked at 1.96 but is improving to 1.56 today.  Good UOP.  Nothing further to add at this time.  Will sign off.  Please call with questions or concerns.  No need for outpatient follow up if her renal function returns to normal.  2. Anasarca- improving with improving renal function.  Continue with IV lasix 3. AAA rupture repair 4. A flutter-  Cardiology following 5. HTN- stable 6. Severe protein malnutrition- supplement and follow.  Likely contributor to anasarca.  No proteinuria. 7. Hypokalemia- replete and follow.  8. Diastolic CHF- per Cardiology.   Irena Cords, MD [employer] [phone]

## 2018-05-22 NOTE — Progress Notes (Signed)
Patient noted to be in Afib RVR, rate in the 120's BP 120/69, patient asymptomatic, Irene Limbo PA notified, Croitoru MD to make rounds on patient will continue to monitor.

## 2018-05-23 ENCOUNTER — Inpatient Hospital Stay (HOSPITAL_COMMUNITY): Payer: Medicare PPO

## 2018-05-23 DIAGNOSIS — L8990 Pressure ulcer of unspecified site, unspecified stage: Secondary | ICD-10-CM

## 2018-05-23 LAB — GLUCOSE, CAPILLARY
Glucose-Capillary: 110 mg/dL — ABNORMAL HIGH (ref 70–99)
Glucose-Capillary: 143 mg/dL — ABNORMAL HIGH (ref 70–99)
Glucose-Capillary: 100 mg/dL — ABNORMAL HIGH (ref 70–99)
Glucose-Capillary: 128 mg/dL — ABNORMAL HIGH (ref 70–99)

## 2018-05-23 LAB — RENAL FUNCTION PANEL
ALBUMIN: 2 g/dL — AB (ref 3.5–5.0)
Anion gap: 10 (ref 5–15)
BUN: 51 mg/dL — ABNORMAL HIGH (ref 8–23)
CO2: 27 mmol/L (ref 22–32)
Calcium: 7.7 mg/dL — ABNORMAL LOW (ref 8.9–10.3)
Chloride: 96 mmol/L — ABNORMAL LOW (ref 98–111)
Creatinine, Ser: 1.42 mg/dL — ABNORMAL HIGH (ref 0.44–1.00)
GFR calc Af Amer: 43 mL/min — ABNORMAL LOW (ref >60)
GFR calc non Af Amer: 37 mL/min — ABNORMAL LOW (ref >60)
Glucose, Bld: 99 mg/dL (ref 70–99)
POTASSIUM: 4.5 mmol/L (ref 3.5–5.1)
Phosphorus: 3 mg/dL (ref 2.5–4.6)
Sodium: 133 mmol/L — ABNORMAL LOW (ref 135–145)

## 2018-05-23 LAB — CBC
HCT: 37.3 % (ref 36.0–46.0)
HEMOGLOBIN: 12 g/dL (ref 12.0–15.0)
MCH: 29.5 pg (ref 26.0–34.0)
MCHC: 32.2 g/dL (ref 30.0–36.0)
MCV: 91.6 fL (ref 80.0–100.0)
Platelets: 168 10*3/uL (ref 150–400)
RBC: 4.07 MIL/uL (ref 3.87–5.11)
RDW: 16.6 % — ABNORMAL HIGH (ref 11.5–15.5)
WBC: 10.3 10*3/uL (ref 4.0–10.5)
nRBC: 0 % (ref 0.0–0.2)

## 2018-05-23 MED ORDER — SIMETHICONE 80 MG PO CHEW
80.0000 mg | CHEWABLE_TABLET | Freq: Three times a day (TID) | ORAL | Status: DC
  Administered 2018-05-23 – 2018-05-30 (×13): 80 mg via ORAL
  Filled 2018-05-23 (×14): qty 1

## 2018-05-23 NOTE — Progress Notes (Signed)
Patient ambulated multiple times in the hall on this shift, ambulation well tolerated will monitor.

## 2018-05-23 NOTE — Progress Notes (Signed)
Progress Note  Patient Name: Leah Mata Date of Encounter: 05/23/2018  Primary Cardiologist: Kristeen Miss, MD   Subjective   Making good progress. Converted back to sinus rhythm around 0200 hrs. Started on Eliquis, no bleeding problems so far.  Inpatient Medications    Scheduled Meds: . apixaban  5 mg Oral BID  . chlorhexidine  15 mL Mouth Rinse BID  . sennosides  10 mL Per Tube QHS   And  . docusate  100 mg Per Tube QHS  . feeding supplement (ENSURE ENLIVE)  237 mL Oral Q1500  . fluticasone  2 spray Each Nare Daily  . furosemide  80 mg Intravenous Q8H  . insulin aspart  0-15 Units Subcutaneous TID WC & HS  . ipratropium-albuterol  3 mL Nebulization TID  . mouth rinse  15 mL Mouth Rinse q12n4p  . metoprolol succinate  100 mg Oral Daily  . pantoprazole  40 mg Oral BID  . potassium chloride  40 mEq Oral BID  . sodium chloride flush  10-40 mL Intracatheter Q12H   Continuous Infusions: . sodium chloride 250 mL (05/14/18 1211)  . magnesium sulfate 1 - 4 g bolus IVPB     PRN Meds: sodium chloride, ALPRAZolam, alum & mag hydroxide-simeth, bisacodyl, guaiFENesin-dextromethorphan, hydrALAZINE, levalbuterol, magnesium sulfate 1 - 4 g bolus IVPB, morphine injection, ondansetron, oxyCODONE-acetaminophen, phenol, sodium chloride flush, sodium chloride flush   Vital Signs    Vitals:   05/23/18 0548 05/23/18 0601 05/23/18 0718 05/23/18 0819  BP:  111/67 112/80   Pulse:   71 69  Resp: (!) 24 (!) 26 (!) 22   Temp: 98.7 F (37.1 C)  97.7 F (36.5 C)   TempSrc: Oral  Oral   SpO2:   100%   Weight: 83.1 kg     Height:        Intake/Output Summary (Last 24 hours) at 05/23/2018 0959 Last data filed at 05/23/2018 0954 Gross per 24 hour  Intake 400 ml  Output 1200 ml  Net -800 ml   Last 3 Weights 05/23/2018 05/22/2018 05/21/2018  Weight (lbs) 183 lb 3.2 oz 189 lb 9.5 oz 188 lb 1.6 oz  Weight (kg) 83.1 kg 86 kg 85.322 kg      Telemetry    Sinus rhythm since 0200 hrs.-  Personally Reviewed  ECG    No new tracing- Personally Reviewed  Physical Exam  Appears well GEN: No acute distress.   Neck: No JVD Cardiac: RRR, no murmurs, rubs, or gallops.  Respiratory: Clear to auscultation bilaterally, but with diffusely reduced breath sounds. GI: Soft, nontender, non-distended  MS: No edema; No deformity. Neuro:  Nonfocal  Psych: Normal affect   Labs    Chemistry Recent Labs  Lab 05/20/18 0539 05/21/18 0421 05/22/18 0500 05/23/18 0818  NA 135 134* 135 133*  K 3.1* 3.1* 3.5 4.5  CL 97* 93* 98 96*  CO2 28 30 30 27   GLUCOSE 95 109* 97 99  BUN 73* 67* 55* 51*  CREATININE 1.96* 1.81* 1.56* 1.42*  CALCIUM 7.6* 8.0* 7.8* 7.7*  PROT 4.8*  --   --   --   ALBUMIN 1.8*  --   --  2.0*  AST 27  --   --   --   ALT 31  --   --   --   ALKPHOS 100  --   --   --   BILITOT 1.1  --   --   --   GFRNONAA 25* 28* 33*  37*  GFRAA 29* 32* 38* 43*  ANIONGAP 10 11 7 10      Hematology Recent Labs  Lab 05/19/18 0426 05/20/18 0539  WBC 10.9* 7.5  RBC 3.46* 3.25*  HGB 10.2* 9.5*  HCT 30.7* 29.2*  MCV 88.7 89.8  MCH 29.5 29.2  MCHC 33.2 32.5  RDW 17.2* 17.1*  PLT 140* 130*    Cardiac EnzymesNo results for input(s): TROPONINI in the last 168 hours. No results for input(s): TROPIPOC in the last 168 hours.   BNPNo results for input(s): BNP, PROBNP in the last 168 hours.   DDimer No results for input(s): DDIMER in the last 168 hours.   Radiology    No results found.  Cardiac Studies   Echo May 03, 2018 1. The left ventricle has hyperdynamic systolic function of >65%. The cavity size is normal. There is no increased left ventricular wall thickness. Echo evidence of impaired diastolic relaxation. 2. The right ventricle has normal systolic function. The cavity in normal in size. There is no increase in right ventricular wall thickness. 3. The mitral valve is normal in structure. 4. The aortic valve has an indeterminant number of cusps. 5. The  pulmonic valve is Not well seen. Pulmonic valve regurgitation was not assessed by color flow Doppler. 6. There is mild dilatation of the aortic root. 7. The interatrial septum was not assessed. 8. Extremely limited due to poor sound wave transmission; definity used; vigorous LV systolic function; mild diastolic dysfunction.  Patient Profile     73 y.o. female status post emergency repair for ruptured abdominal aortic aneurysm, postop course complicated by atrial fibrillation with rapid ventricular response and subsequently atrial flutter with 2:1 AV block, now back in normal rhythm.  Assessment & Plan    1.  Aflutter:  Back in sinus rhythm.  Normal heart rate and blood pressure on metoprolol 100 mg daily.  Continue Eliquis anticoagulation long-term, barring any bleeding complications.  She has had both atrial fibrillation and atrial flutter, there is little benefit from ablation.  The arrhythmia itself is asymptomatic and she does not require antiarrhythmics..  The arrhythmia is asymptomatic and likely precedes the aortic aneurysm rupture and her acute illness. 2. CHF:  Clinically euvolemic, but her weight remains about 20 pounds above her average weights over the last 2 or 3 years.  Expect spontaneous diuresis will occur over the next couple of weeks. 3. CKD:  Creatinine continues to improve. 4. COPD: He has decided to quit smoking permanently.  Underlying chronic lung disease may be the substrate for her tendency to have atrial flutter.     For questions or updates, please contact [facility] Please consult [email or web address] for contact info under        Signed, Thurmon Fair, MD  05/23/2018, 9:59 AM

## 2018-05-23 NOTE — Progress Notes (Signed)
Nutrition Follow-up  DOCUMENTATION CODES:   Obesity unspecified  INTERVENTION:    Hormel Shake (Vital Cuisine 500) BID between meals, each supplement provides 520 kcals and 22 grams of protein  D/C Ensure, patient not drinking  NUTRITION DIAGNOSIS:   Inadequate oral intake related to decreased appetite as evidenced by per patient/family report.  Ongoing  GOAL:   Patient will meet greater than or equal to 90% of their needs  Progressing  MONITOR:   PO intake, Supplement acceptance, Labs, I & O's, Skin  ASSESSMENT:   73 yo Female who presented to [facility] with abdominal and right flank pain.  CT scan showed large greater than 8 cm infrarenal aneurysm with contained rupture in the right retroperitoneal space.  She was directed to come immediately to [facility] to the operating room for surgery.   Patient is now on a 2 gm sodium diet with 1200 ml fluid restriction. She c/o always feeling full. She has been consuming on average < 50% of meals. She does not like the Ensure or Premier Protein supplements. She agreed to try Hormel shakes BID between meals.    Labs reviewed.  Sodium 133 (L) CBG's: 100-114-110 Medications reviewed and include lasix, KCl.   Diet Order:   Diet Order            Diet 2 gram sodium Room service appropriate? Yes; Fluid consistency: Thin; Fluid restriction: 1200 mL Fluid  Diet effective now              EDUCATION NEEDS:   Not appropriate for education at this time  Skin:  Skin Assessment: Skin Integrity Issues: Skin Integrity Issues:: Incisions Incisions: abdomen  Last BM:  2/18  Height:   Ht Readings from Last 1 Encounters:  04/29/18 5\' 4"  (1.626 m)    Weight:   Wt Readings from Last 1 Encounters:  05/23/18 83.1 kg   Weight trending down, but remains above usual weight.   Ideal Body Weight:  54.5 kg  BMI:  Body mass index is 31.45 kg/m.  Estimated Nutritional Needs:   Kcal:  1800-2000  Protein:  90-105 gm  Fluid:  1.8-2.0  L    Joaquin Courts, RD, LDN, CNSC Pager [phone] After Hours Pager [phone]

## 2018-05-23 NOTE — Progress Notes (Signed)
Physical Therapy Treatment Patient Details Name: Leah Mata MRN: [id number] DOB: 1945/11/12 Today's Date: 05/23/2018    History of Present Illness Pt is a 73 y.o. F with significant PMH of COPD who presents with abdominal and right flank pain. 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 S/p aneurysm abdominal aortic repair. Hospitalization complicated by worsening SOB requiring bipap on 2/4 due to pulmonary effusion, tachycardia 2/5 requiring amiodorone drip.    PT Comments    Patient seen for mobility progression, ambulating in hall without difficulty, HR stable 90s-100s today. No physical assist required throughout session. Daughter in law inquiring regarding recommendations and wanting patient to go to CIR. Explained to daughter and patient that at this time, therapy is not providing any physical assist, confirmed patient is walking multiple times a day with family in addition to ambulating with therapy. At this time, do not feel patient requires the intensity of CIR stay. Daughter in law expressed concerns stating patient was extremely active prior to hospitalization, explained recommendation of home health therapies with potential to progress to outpatient therapies to maximize recovery to return to active baseline functional status.  Continue to feel that home health therapies would be most appropriate disposition at this time as patient has demonstrated over multiple sessions the ability to mobilize safely and without need for physical assist.    Follow Up Recommendations  Home health PT;Supervision/Assistance - 24 hour(initally for patient comfort)     Equipment Recommendations  3in1 (PT);Other (comment)(rollator 4 wheels )    Recommendations for Other Services       Precautions / Restrictions Precautions Precautions: Fall Precaution Comments: watch HR, MD anticipates it staying in mid 120s Restrictions Weight  Bearing Restrictions: No    Mobility  Bed Mobility               General bed mobility comments: received in chair  Transfers Overall transfer level: Needs assistance Equipment used: 4-wheeled walker Transfers: Sit to/from Stand Sit to Stand: Supervision         General transfer comment: supervision to power up to standing at Rollator, no physical assist required  Ambulation/Gait Ambulation/Gait assistance: Supervision Gait Distance (Feet): 180 Feet Assistive device: 4-wheeled walker Gait Pattern/deviations: Step-through pattern;Decreased stride length;Wide base of support;Trunk flexed Gait velocity: decreased Gait velocity interpretation: <1.8 ft/sec, indicate of risk for recurrent falls General Gait Details: supervision to monitor HR   Stairs             Wheelchair Mobility    Modified Rankin (Stroke Patients Only)       Balance Overall balance assessment: Needs assistance Sitting-balance support: Feet supported Sitting balance-[name]: Good     Standing balance support: No upper extremity supported;During functional activity Standing balance-[name]: Fair Standing balance comment: able to release UE support briefly at sink                            Cognition Arousal/Alertness: Awake/alert Behavior During Therapy: WFL for tasks assessed/performed Overall Cognitive Status: Within Functional Limits for tasks assessed                                        Exercises      General Comments        Pertinent Vitals/Pain Pain Assessment: No/denies pain Faces Pain Scale:  No hurt Pain Location: abdominal incision Pain Descriptors / Indicators: Grimacing;Guarding    Home Living                      Prior Function            PT Goals (current goals can now be found in the care plan section) Acute Rehab PT Goals Patient Stated Goal: to get home PT Goal Formulation: With patient Time For Goal  Achievement: 05/26/18 Potential to Achieve Goals: Good Progress towards PT goals: Progressing toward goals    Frequency    Min 3X/week      PT Plan Discharge plan needs to be updated    Co-evaluation              AM-PAC PT "6 Clicks" Mobility   Outcome Measure  Help needed turning from your back to your side while in a flat bed without using bedrails?: None Help needed moving from lying on your back to sitting on the side of a flat bed without using bedrails?: None Help needed moving to and from a bed to a chair (including a wheelchair)?: None Help needed standing up from a chair using your arms (e.g., wheelchair or bedside chair)?: A Little Help needed to walk in hospital room?: A Little Help needed climbing 3-5 steps with a railing? : A Lot 6 Click Score: 20    End of Session Equipment Utilized During Treatment: Oxygen Activity Tolerance: Patient tolerated treatment well Patient left: in chair;with call bell/phone within reach;with family/visitor present Nurse Communication: Mobility status PT Visit Diagnosis: Unsteadiness on feet (R26.81);Muscle weakness (generalized) (M62.81) Pain - part of body: (abdomen)     Time: 4680-3212 PT Time Calculation (min) (ACUTE ONLY): 14 min  Charges:  $Gait Training: 8-22 mins                     Charlotte Crumb, PT DPT  Board Certified Neurologic Specialist Acute Rehabilitation Services Pager [phone] Office [phone]    Leah Mata 05/23/2018, 8:45 AM

## 2018-05-23 NOTE — Care Management Note (Addendum)
Case Management Note Donn Pierini RN, BSN Transitions of Care Unit 4E- RN Case Manager [phone]  Patient Details  Name: Leah Mata MRN: [id number] Date of Birth: 12/20/1945  Subjective/Objective:   Pt admitted s/p AAA repair with extended stay due to post op complications of post op ileus and vol. overload                 Action/Plan: PTA pt lived at home with son/grandson. CIR was consulted and initially following for admission- however pt has progressed past needing intensive IP rehab, and now PT/OT are recommending HH therapies with 24/7 initial supervision. CM spoke with pt and DIL at bedside, pt reports that son works and would not be able to provide 24/7 assistance. DIL has offered pt to come stay with her initially. Discussed options of Home with HH vs STSNF. Also discussed DME potential needs including rollator, 3n1, ?hospital bed and home 02.  Pt and family to discuss options. List provided to pt Per CMS guidelines from medicare.gov website with star ratings (copy placed in shadow chart) for HH choice- CM to f/u with pt and family in am for decision on transition plan home vs STSNF.   Expected Discharge Date:                  Expected Discharge Plan:  Home w Home Health Services  In-House Referral:  Clinical Social Work  Discharge planning Services  CM Consult  Post Acute Care Choice:  Durable Medical Equipment, Home Health Choice offered to:  Patient  DME Arranged:    DME Agency:     HH Arranged:    HH Agency:     Status of Service:  In process, will continue to follow  If discussed at Long Length of Stay Meetings, dates discussed:    Discharge Disposition:   Additional Comments:  05/24/18- 1150- Marua Qin RN, CM- noted pt with recurrent ileus vs SBO- and NGT re-inserted. CM did speak with pt and son at bedside and will continue to follow for transition of care needs as pt progresses.    Darrold Span, RN 05/23/2018, 1:19 PM

## 2018-05-23 NOTE — Progress Notes (Addendum)
  Progress Note    05/23/2018 8:04 AM 25 Days Post-Op  Subjective:  Says she is feeling better.  Still wanting to go to CIR and feels she needs more rehab  Afebrile HR 70's-110's now in NSR 90's-110's systolic 100% 2LO2NC    Vitals:   05/23/18 0601 05/23/18 0718  BP: 111/67 112/80  Pulse:  71  Resp: (!) 26 (!) 22  Temp:  97.7 F (36.5 C)  SpO2:  100%    Physical Exam: Cardiac:  regular Lungs:  Non labored Incisions:  Clean and dry-staples out.  No further drainage Extremities:  Bilateral feet are warm; pitting edema BLE Abdomen:  Soft, NT  CBC    Component Value Date/Time   WBC 7.5 05/20/2018 0539   RBC 3.25 (L) 05/20/2018 0539   HGB 9.5 (L) 05/20/2018 0539   HCT 29.2 (L) 05/20/2018 0539   PLT 130 (L) 05/20/2018 0539   MCV 89.8 05/20/2018 0539   MCH 29.2 05/20/2018 0539   MCHC 32.5 05/20/2018 0539   RDW 17.1 (H) 05/20/2018 0539   LYMPHSABS 0.8 05/15/2018 0657   MONOABS 2.0 (H) 05/15/2018 0657   EOSABS 0.1 05/15/2018 0657   BASOSABS 0.1 05/15/2018 0657    BMET    Component Value Date/Time   NA 135 05/22/2018 0500   K 3.5 05/22/2018 0500   CL 98 05/22/2018 0500   CO2 30 05/22/2018 0500   GLUCOSE 97 05/22/2018 0500   BUN 55 (H) 05/22/2018 0500   CREATININE 1.56 (H) 05/22/2018 0500   CALCIUM 7.8 (L) 05/22/2018 0500   GFRNONAA 33 (L) 05/22/2018 0500   GFRAA 38 (L) 05/22/2018 0500    INR    Component Value Date/Time   INR 1.16 04/30/2018 0413     Intake/Output Summary (Last 24 hours) at 05/23/2018 0804 Last data filed at 05/23/2018 0140 Gross per 24 hour  Intake 440 ml  Output 1950 ml  Net -1510 ml     Assessment:  73 y.o. female is s/p:  Repair rupture AAA  25 Days Post-Op  Plan: -pt doing well from AAA standpoint and tolerating diet -labs are in process-having trouble getting labs this morning.  Creatinine was down yesterday.   -aflutter/fib-in NSR this am.  Eliquis started by cards.  -pt feels she needs further rehab and feels she  would benefit from CIR-will call them back today to see if we can get here there before going home.    Doreatha Massed, PA-C [facility] [phone] 05/23/2018 8:04 AM   I have examined the patient, reviewed and agree with above.  Severely deconditioned 24 days status post ruptured abdominal aortic aneurysm.  Would be very difficult for her to be discharged straight to home.  Feel that she would benefit from CIR.  Will reconsult  Gretta Began, MD 05/23/2018 9:17 AM

## 2018-05-24 ENCOUNTER — Inpatient Hospital Stay (HOSPITAL_COMMUNITY): Payer: Medicare PPO

## 2018-05-24 LAB — CBC
HCT: 33.9 % — ABNORMAL LOW (ref 36.0–46.0)
Hemoglobin: 10.8 g/dL — ABNORMAL LOW (ref 12.0–15.0)
MCH: 29 pg (ref 26.0–34.0)
MCHC: 31.9 g/dL (ref 30.0–36.0)
MCV: 90.9 fL (ref 80.0–100.0)
Platelets: 167 10*3/uL (ref 150–400)
RBC: 3.73 MIL/uL — ABNORMAL LOW (ref 3.87–5.11)
RDW: 16.4 % — ABNORMAL HIGH (ref 11.5–15.5)
WBC: 9.7 10*3/uL (ref 4.0–10.5)
nRBC: 0 % (ref 0.0–0.2)

## 2018-05-24 LAB — RENAL FUNCTION PANEL
ALBUMIN: 2.2 g/dL — AB (ref 3.5–5.0)
Anion gap: 10 (ref 5–15)
BUN: 45 mg/dL — ABNORMAL HIGH (ref 8–23)
CO2: 32 mmol/L (ref 22–32)
Calcium: 8.2 mg/dL — ABNORMAL LOW (ref 8.9–10.3)
Chloride: 95 mmol/L — ABNORMAL LOW (ref 98–111)
Creatinine, Ser: 1.39 mg/dL — ABNORMAL HIGH (ref 0.44–1.00)
GFR calc Af Amer: 44 mL/min — ABNORMAL LOW (ref >60)
GFR calc non Af Amer: 38 mL/min — ABNORMAL LOW (ref >60)
Glucose, Bld: 103 mg/dL — ABNORMAL HIGH (ref 70–99)
PHOSPHORUS: 3.3 mg/dL (ref 2.5–4.6)
Potassium: 4.5 mmol/L (ref 3.5–5.1)
Sodium: 137 mmol/L (ref 135–145)

## 2018-05-24 LAB — GLUCOSE, CAPILLARY
GLUCOSE-CAPILLARY: 100 mg/dL — AB (ref 70–99)
Glucose-Capillary: 104 mg/dL — ABNORMAL HIGH (ref 70–99)
Glucose-Capillary: 105 mg/dL — ABNORMAL HIGH (ref 70–99)
Glucose-Capillary: 116 mg/dL — ABNORMAL HIGH (ref 70–99)
Glucose-Capillary: 97 mg/dL (ref 70–99)

## 2018-05-24 LAB — APTT: aPTT: 67 seconds — ABNORMAL HIGH (ref 24–36)

## 2018-05-24 MED ORDER — DEXTROSE-NACL 5-0.45 % IV SOLN
INTRAVENOUS | Status: DC
  Administered 2018-05-24 – 2018-05-30 (×9): via INTRAVENOUS

## 2018-05-24 MED ORDER — LORAZEPAM 2 MG/ML IJ SOLN
0.5000 mg | Freq: Four times a day (QID) | INTRAMUSCULAR | Status: DC | PRN
  Administered 2018-05-24: 0.5 mg via INTRAVENOUS
  Filled 2018-05-24: qty 1

## 2018-05-24 MED ORDER — METOPROLOL TARTRATE 5 MG/5ML IV SOLN
5.0000 mg | Freq: Four times a day (QID) | INTRAVENOUS | Status: DC
  Administered 2018-05-24: 5 mg via INTRAVENOUS
  Filled 2018-05-24 (×2): qty 5

## 2018-05-24 MED ORDER — HEPARIN (PORCINE) 25000 UT/250ML-% IV SOLN
1250.0000 [IU]/h | INTRAVENOUS | Status: DC
  Administered 2018-05-24 – 2018-05-26 (×3): 1100 [IU]/h via INTRAVENOUS
  Administered 2018-05-27: 1250 [IU]/h via INTRAVENOUS
  Filled 2018-05-24 (×4): qty 250

## 2018-05-24 NOTE — Progress Notes (Signed)
The impression on the abdominal scan was a recurrent severe small bowel ileus vs small bowel obstruction.  The pt was also given a dulcolax suppository at 21:38. While she did have a bowel movement, the patient still didn't feel relieved from the abdominal pain.  Dr. Myra Gianotti was informed of the results and ordered to put the Pt on NPO diet except sips of water with meds and to have BMP and CBC labs drawn in the AM. If the pt started to feel nauseous and/or vomiting, she would need to be put back on an NG tube.  Will continue to monitor pt.  Harriet Masson, RN

## 2018-05-24 NOTE — Progress Notes (Signed)
26 Days Post-Op    CC: SBO versus ileus  Subjective: Patient is a 73 year old female who presented with a ruptured infrarenal abdominal aortic aneurysm and underwent repair on 04/28/2018.  Stopped course complicated by PAF and aspiration pneumonia.  She developed abdominal distention on 05/06/2018.  CT of the abdomen pelvis was done on 2/9 and revealed distended small bowel loops with point the proximal ileum.  In the proximal ileum.  An NG was placed and we were asked to see in consultation.  She underwent a small bowel protocol she had 4-1/2 L of NG fluid out that day and no contrast was seen.  She was placed on TPN for nutritional support.  By 05/13/2018 NG was down to 300 and she will underwent clamping trials.  NG was removed on 2/17 and she was advanced to full liquid diet.  By 2/18 she was on a full liquid diet having bowel movements.  Her diet was advanced and we signed off.  Yesterday's note shows she was doing well and they were working on evaluation for CIR.  Last evening patient started complaining of abdominal pain persisting all day.  She did have a bowel movement but had not passed any gas rectal area she was burping and it sounds like she vomited at least once.  Single view film was obtained last evening which showed marked gaseous distention of multiple loops of small bowel throughout the abdomen, more so than with a film 12 days prior.  Repeat exam this a.m. show SB dilatation.  CT on 05/07/2018, showed a transition point in the proximal ileum with some slight angulation.  Currently patient is still distended.  No bowel sounds.  She only has 60 listed in her NG tube but the canister is about half full.  She also has a BM listed for yesterday. Labs show maximal creatinine 1.94 down to 1.39 yesterday.  CBC was stable yesterday. We are asked to see again.    Objective: Vital signs in last 24 hours: Temp:  [97.5 F (36.4 C)-98.5 F (36.9 C)] 98.5 F (36.9 C) (02/26 0841) Pulse Rate:   [67-76] 72 (02/26 0841) Resp:  [19-24] 19 (02/26 0841) BP: (102-140)/(54-76) 106/54 (02/26 0841) SpO2:  [92 %-99 %] 98 % (02/26 0841) Weight:  [80.1 kg] 80.1 kg (02/26 0527) Last BM Date: 05/23/18  Intake/Output from previous day: 02/25 0701 - 02/26 0700 In: 420 [P.O.:420] Out: 1785 [Urine:1725; Emesis/NG output:60] Intake/Output this shift: No intake/output data recorded.  General appearance: alert, cooperative and no distress Resp: clear to auscultation bilaterally Cardio: regular rate and rhythm, S1, S2 normal, no murmur, click, rub or gallop GI: Distended, well-healed midline surgical incision, no bowel sounds, no flatus.  Lab Results:  Recent Labs    05/23/18 0947 05/24/18 0523  WBC 10.3 9.7  HGB 12.0 10.8*  HCT 37.3 33.9*  PLT 168 167    BMET Recent Labs    05/23/18 0818 05/24/18 0523  NA 133* 137  K 4.5 4.5  CL 96* 95*  CO2 27 32  GLUCOSE 99 103*  BUN 51* 45*  CREATININE 1.42* 1.39*  CALCIUM 7.7* 8.2*   PT/INR No results for input(s): LABPROT, INR in the last 72 hours.  Recent Labs  Lab 05/20/18 0539 05/23/18 0818 05/24/18 0523  AST 27  --   --   ALT 31  --   --   ALKPHOS 100  --   --   BILITOT 1.1  --   --   PROT 4.8*  --   --  ALBUMIN 1.8* 2.0* 2.2*     Lipase  No results found for: LIPASE   Medications: . chlorhexidine  15 mL Mouth Rinse BID  . sennosides  10 mL Per Tube QHS   And  . docusate  100 mg Per Tube QHS  . fluticasone  2 spray Each Nare Daily  . furosemide  80 mg Intravenous Q8H  . insulin aspart  0-15 Units Subcutaneous TID WC & HS  . ipratropium-albuterol  3 mL Nebulization TID  . mouth rinse  15 mL Mouth Rinse q12n4p  . metoprolol tartrate  5 mg Intravenous Q6H  . pantoprazole  40 mg Oral BID  . potassium chloride  40 mEq Oral BID  . simethicone  80 mg Oral TID  . sodium chloride flush  10-40 mL Intracatheter Q12H   . sodium chloride 250 mL (05/14/18 1211)  . magnesium sulfate 1 - 4 g bolus IVPB       Assessment/Plan Current smoker COPD HTN HLD Paroxysmal atrial fibrillation - to start Eliquis today AKI  Ruptured abdominal aortic aneurysm with open repair, 04/29/2019, Dr. Tawanna Cooler Early Postop ileus versus SBO  FEN: Currently no IV fluids/n.p.o./NG in place ID: None DVT: She has been on heparin but supposed to convert over to Eliquis today. Follow-up: Be determined  Plan:  Recurrent SBO vs ileus, symptoms developed yesterday, but she has not been eating a lot.  She had a BM yesterday also.  There was a transition point on her last CT that was concerning, but symptoms resolved after a few days with NG suction and bowel rest.    I would hold up on the Apixaban for now and be sure she does not need something further.  I will update her labs and be sure the electrolytes  are normal.  Agree with NG decompression, and bowel rest.  She has been getting lasix 80 mg TID.  She is not getting any IV fluids currently, creatinine is improving.  I will leave fluids and lasix to Dr. Arbie Cookey.  We will repeat film in AM and see how she is doing.  I would make all her medicines IV for now.  She has a wick in place, but I would recommend you keep her ambulating and moving.  She can clamp the NG to walk and go to BR. Conservative management for now.        LOS: 26 days    Millette Halberstam 05/24/2018 [phone]

## 2018-05-24 NOTE — Progress Notes (Signed)
Pt complains of abdominal pain that started this am and has not subsided all day.  Appetite has decreased and did not eat dinner. She did have a bowel movement today but she has not passed gas from rectal area. She is "burping" gas.  Ordered Mylicon and Maalox meds are not helping to ease the pain. Pt and family also mention how on Saturday the pt did throw up and it appeared green like the substance in the canisters from when she was being suctioned through an NG tube.  Dr. Myra Gianotti was informed and he said to order a KUB scan and to continue walking the pt to see if it can help move the bowels.  Will continue to monitor.  Harriet Masson, RN

## 2018-05-24 NOTE — Progress Notes (Signed)
  Progress Note    05/24/2018 7:29 AM 26 Days Post-Op  Subjective:  Was having increased pain yesterday and vomiting.  Better now that NGT is in place.   Afebrile HR 60's-70's NSR 100's-140's systolic 95% 2LO2NC  Vitals:   05/23/18 2323 05/24/18 0527  BP: 118/70 (!) 105/57  Pulse: 69 76  Resp: (!) 23 (!) 24  Temp: 97.6 F (36.4 C) 98 F (36.7 C)  SpO2: 94% 92%    Physical Exam: Cardiac:  regular Lungs:  Non labored Incisions:  Healing nicely Extremities:  Pitting edema BLE Abdomen:  Distended but soft and non tender  CBC    Component Value Date/Time   WBC 9.7 05/24/2018 0523   RBC 3.73 (L) 05/24/2018 0523   HGB 10.8 (L) 05/24/2018 0523   HCT 33.9 (L) 05/24/2018 0523   PLT 167 05/24/2018 0523   MCV 90.9 05/24/2018 0523   MCH 29.0 05/24/2018 0523   MCHC 31.9 05/24/2018 0523   RDW 16.4 (H) 05/24/2018 0523   LYMPHSABS 0.8 05/15/2018 0657   MONOABS 2.0 (H) 05/15/2018 0657   EOSABS 0.1 05/15/2018 0657   BASOSABS 0.1 05/15/2018 0657    BMET    Component Value Date/Time   NA 137 05/24/2018 0523   K 4.5 05/24/2018 0523   CL 95 (L) 05/24/2018 0523   CO2 32 05/24/2018 0523   GLUCOSE 103 (H) 05/24/2018 0523   BUN 45 (H) 05/24/2018 0523   CREATININE 1.39 (H) 05/24/2018 0523   CALCIUM 8.2 (L) 05/24/2018 0523   GFRNONAA 38 (L) 05/24/2018 0523   GFRAA 44 (L) 05/24/2018 0523    INR    Component Value Date/Time   INR 1.16 04/30/2018 0413     Intake/Output Summary (Last 24 hours) at 05/24/2018 0729 Last data filed at 05/24/2018 3358 Gross per 24 hour  Intake 420 ml  Output 1725 ml  Net -1305 ml     Assessment:  73 y.o. female is s/p:  Repair rupture AAA   26 Days Post-Op  Plan: -pt with increased abdominal pain and vomiting yesterday.  NGT placed last night (~200cc in canister).  Pain and nausea better since placing NGT. Will  re-consult general surgery -xray to check NGT placement. -pt has continued walking throughout pain.  She has had flatus and  had a BM after receiving a suppository yesterday.  Pt and daughter in law state that she did have a bout of vomiting Saturday.  She has had pain after eating for several days.  -antihypertensive meds will need to be converted to IV-RN to page cardiology -DVT prophylaxis:  Eliquis   Doreatha Massed, PA-C [facility] [phone] 05/24/2018 7:29 AM

## 2018-05-24 NOTE — Progress Notes (Signed)
ANTICOAGULATION CONSULT NOTE   Pharmacy Consult for Apixaban>>Heparin Indication: atrial fibrillation  Allergies  Allergen Reactions  . Hydrochlorothiazide     Patient Measurements: Height: 5\' 4"  (162.6 cm) Weight: 176 lb 9.4 oz (80.1 kg) IBW/kg (Calculated) : 54.7 Heparin Dosing Weight: 80kg  Vital Signs: Temp: 98.8 F (37.1 C) (02/26 2006) Temp Source: Oral (02/26 2006) BP: 101/57 (02/26 2006) Pulse Rate: 66 (02/26 2006)  Labs: Recent Labs    05/22/18 0500 05/23/18 0818 05/23/18 0947 05/24/18 0523 05/24/18 2202  HGB  --   --  12.0 10.8*  --   HCT  --   --  37.3 33.9*  --   PLT  --   --  168 167  --   APTT  --   --   --   --  67*  CREATININE 1.56* 1.42*  --  1.39*  --     Estimated Creatinine Clearance: 38 mL/min (A) (by C-G formula based on SCr of 1.39 mg/dL (H)).   Medical History: Past Medical History:  Diagnosis Date  . BACK PAIN 03/14/2009  . COPD 11/01/2006  . GLUCOSE INTOLERANCE 03/14/2009  . Headache(784.0) 03/14/2009  . HYPERTENSION 11/01/2006  . 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 INSOMNIA, HX OF 01/06/2007  . Other and unspecified hyperlipidemia 08/22/2013  . Urinary frequency 03/14/2009   Assessment: 73 year old female with afib on apixaban for the past few days. Converted to NSR 2/25 and is still there today. New concerns for SBP vs ileus and apixaban stopped and will transition patient over to heparin this am.   Will monitor and dose based on aptt's for the next couple days until heparin levels correlate.   2/26 PM update: aPTT therapeutic x 1 tonight  Goal of Therapy:  Heparin level 0.3-0.7 units/ml aPTT 66-102 seconds Monitor platelets by anticoagulation protocol: Yes   Plan:  Cont heparin at 1100 units/hr Confirmatory aPTT with AM labs  Abran Duke, PharmD, BCPS Clinical Pharmacist Phone: [phone]

## 2018-05-24 NOTE — Progress Notes (Signed)
Patient ID: Leah Mata, female   DOB: 21-Mar-1946, 73 y.o.   MRN: [id number] Comfortable.  Abdomen remains nontender. Appreciate general surgery input

## 2018-05-24 NOTE — Progress Notes (Signed)
Progress Note  Patient Name: Leah Mata Date of Encounter: 05/24/2018  Primary Cardiologist: Kristeen Miss, MD   Subjective   No angina or dyspnea. Remains in NSR. Vomiting and abdominal distention, so NG back in place.  Inpatient Medications    Scheduled Meds: . chlorhexidine  15 mL Mouth Rinse BID  . sennosides  10 mL Per Tube QHS   And  . docusate  100 mg Per Tube QHS  . fluticasone  2 spray Each Nare Daily  . furosemide  80 mg Intravenous Q8H  . insulin aspart  0-15 Units Subcutaneous TID WC & HS  . ipratropium-albuterol  3 mL Nebulization TID  . mouth rinse  15 mL Mouth Rinse q12n4p  . metoprolol tartrate  5 mg Intravenous Q6H  . pantoprazole  40 mg Oral BID  . potassium chloride  40 mEq Oral BID  . simethicone  80 mg Oral TID  . sodium chloride flush  10-40 mL Intracatheter Q12H   Continuous Infusions: . sodium chloride 250 mL (05/14/18 1211)  . heparin    . magnesium sulfate 1 - 4 g bolus IVPB     PRN Meds: sodium chloride, ALPRAZolam, alum & mag hydroxide-simeth, bisacodyl, guaiFENesin-dextromethorphan, hydrALAZINE, levalbuterol, magnesium sulfate 1 - 4 g bolus IVPB, morphine injection, ondansetron, oxyCODONE-acetaminophen, phenol, sodium chloride flush, sodium chloride flush   Vital Signs    Vitals:   05/24/18 0527 05/24/18 0809 05/24/18 0841 05/24/18 1000  BP: (!) 105/57  (!) 106/54   Pulse: 76  72   Resp: (!) 24  19   Temp: 98 F (36.7 C)  98.5 F (36.9 C)   TempSrc: Oral  Axillary   SpO2: 92% 96% 98%   Weight: 80.1 kg     Height:     (1.626 m)    Intake/Output Summary (Last 24 hours) at 05/24/2018 1106 Last data filed at 05/24/2018 0700 Gross per 24 hour  Intake 300 ml  Output 1785 ml  Net -1485 ml   Last 3 Weights 05/24/2018 05/23/2018 05/22/2018  Weight (lbs) 176 lb 9.4 oz 183 lb 3.2 oz 189 lb 9.5 oz  Weight (kg) 80.1 kg 83.1 kg 86 kg      Telemetry    NSR - Personally Reviewed  ECG    No new tracing - Personally  Reviewed  Physical Exam  Appears comfortable GEN: No acute distress.   Neck: No JVD Cardiac: RRR, no murmurs, rubs, or gallops.  Respiratory: Clear to auscultation bilaterally. GI: Soft, nontender, moderately distended . Bowel sounds are loud on the L, subdued in the RLQ MS: No edema; No deformity. Neuro:  Nonfocal  Psych: Normal affect   Labs    Chemistry Recent Labs  Lab 05/20/18 0539  05/22/18 0500 05/23/18 0818 05/24/18 0523  NA 135   < > 135 133* 137  K 3.1*   < > 3.5 4.5 4.5  CL 97*   < > 98 96* 95*  CO2 28   < > 30 27 32  GLUCOSE 95   < > 97 99 103*  BUN 73*   < > 55* 51* 45*  CREATININE 1.96*   < > 1.56* 1.42* 1.39*  CALCIUM 7.6*   < > 7.8* 7.7* 8.2*  PROT 4.8*  --   --   --   --   ALBUMIN 1.8*  --   --  2.0* 2.2*  AST 27  --   --   --   --   ALT 31  --   --   --   --  ALKPHOS 100  --   --   --   --   BILITOT 1.1  --   --   --   --   GFRNONAA 25*   < > 33* 37* 38*  GFRAA 29*   < > 38* 43* 44*  ANIONGAP 10   < > 7 10 10    < > = values in this interval not displayed.     Hematology Recent Labs  Lab 05/20/18 0539 05/23/18 0947 05/24/18 0523  WBC 7.5 10.3 9.7  RBC 3.25* 4.07 3.73*  HGB 9.5* 12.0 10.8*  HCT 29.2* 37.3 33.9*  MCV 89.8 91.6 90.9  MCH 29.2 29.5 29.0  MCHC 32.5 32.2 31.9  RDW 17.1* 16.6* 16.4*  PLT 130* 168 167    Cardiac EnzymesNo results for input(s): TROPONINI in the last 168 hours. No results for input(s): TROPIPOC in the last 168 hours.   BNPNo results for input(s): BNP, PROBNP in the last 168 hours.   DDimer No results for input(s): DDIMER in the last 168 hours.   Radiology    Dg Abd 1 View  Result Date: 05/23/2018 CLINICAL DATA:  Acute onset of abdominal distention and bloating. EXAM: ABDOMEN - 1 VIEW COMPARISON:  05/11/2018 and earlier. FINDINGS: Marked gaseous distention of multiple loops of small bowel throughout the abdomen, more so than on the examination 12 days ago. Gas and stool throughout normal caliber colon. No  suggestion of free air on the supine image. IMPRESSION: Recurrent severe small bowel ileus versus small-bowel obstruction. Electronically Signed   By: Hulan Saas M.D.   On: 05/23/2018 20:41    Cardiac Studies   Echo May 03, 2018 1. The left ventricle has hyperdynamic systolic function of >65%. The cavity size is normal. There is no increased left ventricular wall thickness. Echo evidence of impaired diastolic relaxation. 2. The right ventricle has normal systolic function. The cavity in normal in size. There is no increase in right ventricular wall thickness. 3. The mitral valve is normal in structure. 4. The aortic valve has an indeterminant number of cusps. 5. The pulmonic valve is Not well seen. Pulmonic valve regurgitation was not assessed by color flow Doppler. 6. There is mild dilatation of the aortic root. 7. The interatrial septum was not assessed. 8. Extremely limited due to poor sound wave transmission; definity used; vigorous LV systolic function; mild diastolic dysfunction.   Patient Profile     73 y.o. female status post emergency repair for ruptured abdominal aortic aneurysm, postop course complicated by atrial fibrillation with rapid ventricular response and subsequently atrial flutter with 2:1 AV block, now back in normal rhythm. Recurrent problems with postop ileus, NG placed again on 2/26.   Assessment & Plan    1.AFib/Aflutter: Back in sinus rhythm.  Switch to IV metoprolol while on NG suction. Change to IV heparin as long as unable to take Eliquis PO.  She has had both atrial fibrillation and atrial flutter, there is little benefit from ablation.  The arrhythmia itself is asymptomatic and she does not require antiarrhythmics. I suspect that the arrhythmia  likely precedes the aortic aneurysm rupture and her acute illness. 2. CHF: Clinically euvolemic, still about 12-13 lb above her usual weight (some of that may be in her intestines). 3. CKD:  Creatinine continues to improve. 4. COPD:He has decided to quit smoking permanently. Underlying chronic lung disease may be the substrate for her tendency to have atrial flutter.         For questions  or updates, please contact [facility] Please consult [email or web address] for contact info under        Signed, Thurmon Fair, MD  05/24/2018, 11:06 AM

## 2018-05-24 NOTE — Progress Notes (Signed)
ANTICOAGULATION CONSULT NOTE - Initial Consult  Pharmacy Consult for apixaban>>heparin Indication: atrial fibrillation  Allergies  Allergen Reactions  . Hydrochlorothiazide     Patient Measurements: Height: 5\' 4"  (162.6 cm) Weight: 176 lb 9.4 oz (80.1 kg) IBW/kg (Calculated) : 54.7 Heparin Dosing Weight: 80kg  Vital Signs: Temp: 98.5 F (36.9 C) (02/26 0841) Temp Source: Axillary (02/26 0841) BP: 106/54 (02/26 0841) Pulse Rate: 72 (02/26 0841)  Labs: Recent Labs    05/22/18 0500 05/23/18 0818 05/23/18 0947 05/24/18 0523  HGB  --   --  12.0 10.8*  HCT  --   --  37.3 33.9*  PLT  --   --  168 167  CREATININE 1.56* 1.42*  --  1.39*    Estimated Creatinine Clearance: 38 mL/min (A) (by C-G formula based on SCr of 1.39 mg/dL (H)).   Medical History: Past Medical History:  Diagnosis Date  . BACK PAIN 03/14/2009  . COPD 11/01/2006  . GLUCOSE INTOLERANCE 03/14/2009  . Headache(784.0) 03/14/2009  . HYPERTENSION 11/01/2006  . 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 INSOMNIA, HX OF 01/06/2007  . Other and unspecified hyperlipidemia 08/22/2013  . Urinary frequency 03/14/2009   Assessment: 73 year old female with afib on apixaban for the past few days. Converted to NSR 2/25 and is still there today. New concerns for SBP vs ileus and apixaban stopped and will transition patient over to heparin this am.   Will monitor and dose based on aptt's for the next couple days until heparin levels correlate.   Goal of Therapy:  Heparin level 0.3-0.7 units/ml aPTT 66-102 seconds Monitor platelets by anticoagulation protocol: Yes   Plan:  Start heparin infusion at 1100 units/hr Check aptt in 8 hours, continue to monitor H&H and platelets  Sheppard Coil PharmD., BCPS Clinical Pharmacist 05/24/2018 10:44 AM

## 2018-05-24 NOTE — Progress Notes (Signed)
Pt suddenly vomited up yellowish-green liquid emesis.  Dr. Myra Gianotti was paged and ordered to insert an NG tube and place it on intermittent suction.  After inserting the tube and hooking it up to the suction canister, the pt stated she was starting to feel better.  Will continue to monitor pt.  Harriet Masson, RN

## 2018-05-25 ENCOUNTER — Inpatient Hospital Stay (HOSPITAL_COMMUNITY): Payer: Medicare PPO

## 2018-05-25 LAB — CBC
HCT: 34.3 % — ABNORMAL LOW (ref 36.0–46.0)
Hemoglobin: 10.7 g/dL — ABNORMAL LOW (ref 12.0–15.0)
MCH: 28.6 pg (ref 26.0–34.0)
MCHC: 31.2 g/dL (ref 30.0–36.0)
MCV: 91.7 fL (ref 80.0–100.0)
Platelets: 175 10*3/uL (ref 150–400)
RBC: 3.74 MIL/uL — ABNORMAL LOW (ref 3.87–5.11)
RDW: 16.3 % — ABNORMAL HIGH (ref 11.5–15.5)
WBC: 8.5 10*3/uL (ref 4.0–10.5)
nRBC: 0 % (ref 0.0–0.2)

## 2018-05-25 LAB — RENAL FUNCTION PANEL
Albumin: 2.1 g/dL — ABNORMAL LOW (ref 3.5–5.0)
Anion gap: 8 (ref 5–15)
BUN: 42 mg/dL — ABNORMAL HIGH (ref 8–23)
CO2: 33 mmol/L — ABNORMAL HIGH (ref 22–32)
Calcium: 7.8 mg/dL — ABNORMAL LOW (ref 8.9–10.3)
Chloride: 94 mmol/L — ABNORMAL LOW (ref 98–111)
Creatinine, Ser: 1.41 mg/dL — ABNORMAL HIGH (ref 0.44–1.00)
GFR, EST AFRICAN AMERICAN: 43 mL/min — AB (ref >60)
GFR, EST NON AFRICAN AMERICAN: 37 mL/min — AB (ref >60)
Glucose, Bld: 112 mg/dL — ABNORMAL HIGH (ref 70–99)
Phosphorus: 3.3 mg/dL (ref 2.5–4.6)
Potassium: 4.1 mmol/L (ref 3.5–5.1)
Sodium: 135 mmol/L (ref 135–145)

## 2018-05-25 LAB — GLUCOSE, CAPILLARY
Glucose-Capillary: 116 mg/dL — ABNORMAL HIGH (ref 70–99)
Glucose-Capillary: 113 mg/dL — ABNORMAL HIGH (ref 70–99)
Glucose-Capillary: 98 mg/dL (ref 70–99)
Glucose-Capillary: 99 mg/dL (ref 70–99)

## 2018-05-25 LAB — APTT: aPTT: 75 seconds — ABNORMAL HIGH (ref 24–36)

## 2018-05-25 LAB — HEPARIN LEVEL (UNFRACTIONATED): Heparin Unfractionated: >2.2 IU/mL — ABNORMAL HIGH (ref 0.30–0.70)

## 2018-05-25 MED ORDER — METOPROLOL TARTRATE 5 MG/5ML IV SOLN
2.5000 mg | Freq: Four times a day (QID) | INTRAVENOUS | Status: DC
  Administered 2018-05-26 – 2018-05-29 (×12): 2.5 mg via INTRAVENOUS
  Filled 2018-05-25 (×12): qty 5

## 2018-05-25 NOTE — Progress Notes (Signed)
Progress Note  Patient Name: Leah Mata Date of Encounter: 05/25/2018  Primary Cardiologist: Kristeen Miss, MD   Subjective   Ileus seems to be rapidly improving following BM. She feels much better today. Remains in NSR. BP borderline low.  Inpatient Medications    Scheduled Meds: . chlorhexidine  15 mL Mouth Rinse BID  . sennosides  10 mL Per Tube QHS   And  . docusate  100 mg Per Tube QHS  . fluticasone  2 spray Each Nare Daily  . furosemide  80 mg Intravenous Q8H  . insulin aspart  0-15 Units Subcutaneous TID WC & HS  . ipratropium-albuterol  3 mL Nebulization TID  . mouth rinse  15 mL Mouth Rinse q12n4p  . metoprolol tartrate  2.5 mg Intravenous Q6H  . pantoprazole  40 mg Oral BID  . potassium chloride  40 mEq Oral BID  . simethicone  80 mg Oral TID  . sodium chloride flush  10-40 mL Intracatheter Q12H   Continuous Infusions: . sodium chloride 250 mL (05/14/18 1211)  . dextrose 5 % and 0.45% NaCl 50 mL/hr at 05/25/18 0932  . heparin 1,100 Units/hr (05/25/18 0930)  . magnesium sulfate 1 - 4 g bolus IVPB     PRN Meds: sodium chloride, ALPRAZolam, alum & mag hydroxide-simeth, bisacodyl, guaiFENesin-dextromethorphan, hydrALAZINE, levalbuterol, LORazepam, magnesium sulfate 1 - 4 g bolus IVPB, morphine injection, ondansetron, oxyCODONE-acetaminophen, phenol, sodium chloride flush, sodium chloride flush   Vital Signs    Vitals:   05/25/18 0742 05/25/18 1113 05/25/18 1216 05/25/18 1217  BP: (!) 100/54 (!) 91/49 (!) 90/55 (!) 95/51  Pulse: 63 61 67 64  Resp: 17 19 17 19   Temp: 98.7 F (37.1 C) 98 F (36.7 C)    TempSrc: Axillary Oral    SpO2: 98% 95%  99%  Weight:      Height:        Intake/Output Summary (Last 24 hours) at 05/25/2018 1232 Last data filed at 05/25/2018 1226 Gross per 24 hour  Intake 857.03 ml  Output 2525 ml  Net -1667.97 ml   Last 3 Weights 05/25/2018 05/24/2018 05/23/2018  Weight (lbs) 178 lb 8 oz 176 lb 9.4 oz 183 lb 3.2 oz  Weight (kg)  80.967 kg 80.1 kg 83.1 kg      Telemetry    NSR - Personally Reviewed  ECG    No new tracing - Personally Reviewed  Physical Exam  NGT in place GEN: No acute distress.   Neck: No JVD Cardiac: RRR, no murmurs, rubs, or gallops.  Respiratory: Clear to auscultation bilaterally. GI: Soft, nontender, non-distended  MS: No edema; No deformity. Neuro:  Nonfocal  Psych: Normal affect   Labs    Chemistry Recent Labs  Lab 05/20/18 0539  05/23/18 0818 05/24/18 0523 05/25/18 0242  NA 135   < > 133* 137 135  K 3.1*   < > 4.5 4.5 4.1  CL 97*   < > 96* 95* 94*  CO2 28   < > 27 32 33*  GLUCOSE 95   < > 99 103* 112*  BUN 73*   < > 51* 45* 42*  CREATININE 1.96*   < > 1.42* 1.39* 1.41*  CALCIUM 7.6*   < > 7.7* 8.2* 7.8*  PROT 4.8*  --   --   --   --   ALBUMIN 1.8*  --  2.0* 2.2* 2.1*  AST 27  --   --   --   --  ALT 31  --   --   --   --   ALKPHOS 100  --   --   --   --   BILITOT 1.1  --   --   --   --   GFRNONAA 25*   < > 37* 38* 37*  GFRAA 29*   < > 43* 44* 43*  ANIONGAP 10   < > 10 10 8    < > = values in this interval not displayed.     Hematology Recent Labs  Lab 05/23/18 0947 05/24/18 0523 05/25/18 0242  WBC 10.3 9.7 8.5  RBC 4.07 3.73* 3.74*  HGB 12.0 10.8* 10.7*  HCT 37.3 33.9* 34.3*  MCV 91.6 90.9 91.7  MCH 29.5 29.0 28.6  MCHC 32.2 31.9 31.2  RDW 16.6* 16.4* 16.3*  PLT 168 167 175    Cardiac EnzymesNo results for input(s): TROPONINI in the last 168 hours. No results for input(s): TROPIPOC in the last 168 hours.   BNPNo results for input(s): BNP, PROBNP in the last 168 hours.   DDimer No results for input(s): DDIMER in the last 168 hours.   Radiology    Dg Abd 1 View  Result Date: 05/23/2018 CLINICAL DATA:  Acute onset of abdominal distention and bloating. EXAM: ABDOMEN - 1 VIEW COMPARISON:  05/11/2018 and earlier. FINDINGS: Marked gaseous distention of multiple loops of small bowel throughout the abdomen, more so than on the examination 12 days ago.  Gas and stool throughout normal caliber colon. No suggestion of free air on the supine image. IMPRESSION: Recurrent severe small bowel ileus versus small-bowel obstruction. Electronically Signed   By: Hulan Saas M.D.   On: 05/23/2018 20:41   Dg Abd Portable 1v  Result Date: 05/25/2018 CLINICAL DATA:  Small bowel obstruction. EXAM: PORTABLE ABDOMEN - 1 VIEW COMPARISON:  Radiograph of May 24, 2018. FINDINGS: Improved small bowel dilatation is noted compared to prior exam. Nasogastric tube is been partially withdrawn, with distal tip in proximal stomach and side hole at expected position of gastroesophageal junction. No colonic dilatation is noted. IMPRESSION: Improved small bowel dilatation is noted suggesting improving ileus or small-bowel obstruction. Nasogastric tube has been partially withdrawn as described above. Electronically Signed   By: Lupita Raider, M.D.   On: 05/25/2018 08:44   Dg Abd Portable 1v  Result Date: 05/24/2018 CLINICAL DATA:  Nasogastric tube placement. EXAM: PORTABLE ABDOMEN - 1 VIEW COMPARISON:  Radiographs of May 23, 2018. FINDINGS: Nasogastric tube tip is seen in expected position of distal stomach. Stable small bowel dilatation is noted concerning for distal small bowel obstruction. No colonic dilatation is noted. IMPRESSION: Stable findings consistent with distal small bowel obstruction. Interval placement of nasogastric tube with tip in expected position of distal stomach. Electronically Signed   By: Lupita Raider, M.D.   On: 05/24/2018 12:01    Cardiac Studies   Echo May 03, 2018 1. The left ventricle has hyperdynamic systolic function of >65%. The cavity size is normal. There is no increased left ventricular wall thickness. Echo evidence of impaired diastolic relaxation. 2. The right ventricle has normal systolic function. The cavity in normal in size. There is no increase in right ventricular wall thickness. 3. The mitral valve is normal in  structure. 4. The aortic valve has an indeterminant number of cusps. 5. The pulmonic valve is Not well seen. Pulmonic valve regurgitation was not assessed by color flow Doppler. 6. There is mild dilatation of the aortic root. 7. The  interatrial septum was not assessed. 8. Extremely limited due to poor sound wave transmission; definity used; vigorous LV systolic function; mild diastolic dysfunction.  Patient Profile     73 y.o. female status post emergency repair for ruptured abdominal aortic aneurysm, postop course complicated by atrial fibrillation with rapid ventricular response andsubsequentlyatrial flutter with 2:1 AV block,now back in normal rhythm. Recurrent problems with postop ileus, NG placed again on 2/26.  Assessment & Plan   1.AFib/Aflutter:Maintaining sinus rhythm. Reduce the dose of IV metoprolol due to low BP. On IV heparin as long as unable to take Eliquis PO. She has had both atrial fibrillation and atrial flutter, there is little benefit from ablation. The arrhythmia itself is asymptomatic and she does not require antiarrhythmics.  I suspect that the arrhythmia  likely precedes the aortic aneurysm rupture and her acute illness. 2. ONG:[id number] euvolemic,still about 12-13 lb above her usual weight (some of that may be in her intestines). 3. MWN:[id number] appears stable around 1.4, 4. COPD:He has decided to quit smoking permanently. Underlying chronic lung disease may be the substrate for her tendency to have atrial flutter.        For questions or updates, please contact [facility] Please consult [email or web address] for contact info under        Signed, Thurmon Fair, MD  05/25/2018, 12:32 PM

## 2018-05-25 NOTE — Progress Notes (Signed)
Physical Therapy Treatment Patient Details Name: Leah Mata MRN: [id number] DOB: 29-Feb-1946 Today's Date: 05/25/2018    History of Present Illness Pt is a 73 y.o. F with significant PMH of COPD who presents with abdominal and right flank pain. 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 S/p aneurysm abdominal aortic repair. Hospitalization complicated by worsening SOB requiring bipap on 2/4 due to pulmonary effusion, tachycardia 2/5 requiring amiodorone drip.    PT Comments    Pt making excellent progress towards her physical therapy goals. Increased ambulation distance to 600 feet using Rollator, with improved gait speed and no dyspnea on exertion. D/c plan remains appropriate.    Follow Up Recommendations  Home health PT;Supervision/Assistance - 24 hour(initally for patient comfort)     Equipment Recommendations  3in1 (PT);Other (comment)(rollator 4 wheels )    Recommendations for Other Services       Precautions / Restrictions Precautions Precautions: Fall Precaution Comments: watch HR, MD anticipates it staying in mid 120s Restrictions Weight Bearing Restrictions: No    Mobility  Bed Mobility Overal bed mobility: Modified Independent                Transfers Overall transfer level: Needs assistance Equipment used: 4-wheeled walker Transfers: Sit to/from Stand Sit to Stand: Supervision         General transfer comment: supervision to power up to standing at Rollator, no physical assist required  Ambulation/Gait Ambulation/Gait assistance: Supervision Gait Distance (Feet): 600 Feet Assistive device: 4-wheeled walker Gait Pattern/deviations: Step-through pattern;Decreased stride length;Wide base of support;Trunk flexed Gait velocity: decreased   General Gait Details: pt with improved gait speed, good posture   Stairs             Wheelchair Mobility    Modified Rankin (Stroke  Patients Only)       Balance Overall balance assessment: Needs assistance Sitting-balance support: Feet supported Sitting balance-[name]: Good     Standing balance support: No upper extremity supported;During functional activity Standing balance-[name]: Good                              Cognition Arousal/Alertness: Awake/alert Behavior During Therapy: WFL for tasks assessed/performed Overall Cognitive Status: Within Functional Limits for tasks assessed                                        Exercises      General Comments  VSS      Pertinent Vitals/Pain Pain Assessment: Faces Faces Pain Scale: No hurt    Home Living                      Prior Function            PT Goals (current goals can now be found in the care plan section) Acute Rehab PT Goals Patient Stated Goal: to get home PT Goal Formulation: With patient Time For Goal Achievement: 06/08/18 Potential to Achieve Goals: Good Progress towards PT goals: Progressing toward goals    Frequency    Min 3X/week      PT Plan Current plan remains appropriate    Co-evaluation              AM-PAC PT "6 Clicks" Mobility   Outcome Measure  Help needed turning  from your back to your side while in a flat bed without using bedrails?: None Help needed moving from lying on your back to sitting on the side of a flat bed without using bedrails?: None Help needed moving to and from a bed to a chair (including a wheelchair)?: None Help needed standing up from a chair using your arms (e.g., wheelchair or bedside chair)?: None Help needed to walk in hospital room?: None Help needed climbing 3-5 steps with a railing? : A Lot 6 Click Score: 22    End of Session Equipment Utilized During Treatment: Oxygen Activity Tolerance: Patient tolerated treatment well Patient left: in chair;with call bell/phone within reach;with family/visitor present Nurse Communication:  Mobility status PT Visit Diagnosis: Unsteadiness on feet (R26.81);Muscle weakness (generalized) (M62.81) Pain - part of body: (abdomen)     Time: 1449-1510 PT Time Calculation (min) (ACUTE ONLY): 21 min  Charges:  $Therapeutic Activity: 8-22 mins                    Laurina Bustle, PT, DPT Acute Rehabilitation Services Pager [phone] Office [phone]    Vanetta Mulders 05/25/2018, 4:04 PM

## 2018-05-25 NOTE — Progress Notes (Signed)
The pt wanted a medicine to help get to sleep and was unable to receive the ordered Xanax 0.25 mg tablet because she is NPO and her NG tube is on low-intermitent suction. Dr. Arbie Cookey was paged and said to order IV ativan 0.5 mg every 6 hours as needed for anxiety and sleep.  Will continue to monitor.  Harriet Masson, RN

## 2018-05-25 NOTE — Progress Notes (Signed)
PT id using MRN DOB and name

## 2018-05-25 NOTE — Progress Notes (Signed)
Patient ID: Leah Mata, female   DOB: [birth year], 73 y.o.   MRN: [id number]    27 Days Post-Op  Subjective: Patient feels much better today.  Had "the biggest BM of my life" since yesterday.  Still ambulating.  Objective: Vital signs in last 24 hours: Temp:  [97.6 F (36.4 C)-98.8 F (37.1 C)] 98.7 F (37.1 C) (02/27 0742) Pulse Rate:  [61-72] 63 (02/27 0742) Resp:  [16-19] 17 (02/27 0742) BP: (93-127)/(48-59) 100/54 (02/27 0742) SpO2:  [95 %-98 %] 98 % (02/27 0742) FiO2 (%):  [94 %] 94 % (02/27 0734) Weight:  [81 kg] 81 kg (02/27 0342) Last BM Date: 05/23/18  Intake/Output from previous day: 02/26 0701 - 02/27 0700 In: 857 [I.V.:857] Out: 2175 [Urine:1850; Emesis/NG output:325] Intake/Output this shift: No intake/output data recorded.  PE: Gen: NAD Abd: much softer, less distended, NT, +BS, NGT 385 cc documented from yesterday and that's all that's in the cannister still.  Lab Results:  Recent Labs    05/24/18 0523 05/25/18 0242  WBC 9.7 8.5  HGB 10.8* 10.7*  HCT 33.9* 34.3*  PLT 167 175   BMET Recent Labs    05/24/18 0523 05/25/18 0242  NA 137 135  K 4.5 4.1  CL 95* 94*  CO2 32 33*  GLUCOSE 103* 112*  BUN 45* 42*  CREATININE 1.39* 1.41*  CALCIUM 8.2* 7.8*   PT/INR No results for input(s): LABPROT, INR in the last 72 hours. CMP     Component Value Date/Time   NA 135 05/25/2018 0242   K 4.1 05/25/2018 0242   CL 94 (L) 05/25/2018 0242   CO2 33 (H) 05/25/2018 0242   GLUCOSE 112 (H) 05/25/2018 0242   BUN 42 (H) 05/25/2018 0242   CREATININE 1.41 (H) 05/25/2018 0242   CALCIUM 7.8 (L) 05/25/2018 0242   PROT 4.8 (L) 05/20/2018 0539   ALBUMIN 2.1 (L) 05/25/2018 0242   AST 27 05/20/2018 0539   ALT 31 05/20/2018 0539   ALKPHOS 100 05/20/2018 0539   BILITOT 1.1 05/20/2018 0539   GFRNONAA 37 (L) 05/25/2018 0242   GFRAA 43 (L) 05/25/2018 0242   Lipase  No results found for: LIPASE     Studies/Results: Dg Abd 1 View  Result Date:  05/23/2018 CLINICAL DATA:  Acute onset of abdominal distention and bloating. EXAM: ABDOMEN - 1 VIEW COMPARISON:  05/11/2018 and earlier. FINDINGS: Marked gaseous distention of multiple loops of small bowel throughout the abdomen, more so than on the examination 12 days ago. Gas and stool throughout normal caliber colon. No suggestion of free air on the supine image. IMPRESSION: Recurrent severe small bowel ileus versus small-bowel obstruction. Electronically Signed   By: Hulan Saas M.D.   On: 05/23/2018 20:41   Dg Abd Portable 1v  Result Date: 05/24/2018 CLINICAL DATA:  Nasogastric tube placement. EXAM: PORTABLE ABDOMEN - 1 VIEW COMPARISON:  Radiographs of May 23, 2018. FINDINGS: Nasogastric tube tip is seen in expected position of distal stomach. Stable small bowel dilatation is noted concerning for distal small bowel obstruction. No colonic dilatation is noted. IMPRESSION: Stable findings consistent with distal small bowel obstruction. Interval placement of nasogastric tube with tip in expected position of distal stomach. Electronically Signed   By: Lupita Raider, M.D.   On: 05/24/2018 12:01    Anti-infectives: Anti-infectives (From admission, onward)   Start     Dose/Rate Route Frequency Ordered Stop   05/03/18 1815  cefTRIAXone (ROCEPHIN) 1 g in sodium chloride 0.9 % 100 mL IVPB  Status:  Discontinued     1 g 200 mL/hr over 30 Minutes Intravenous Every 24 hours 05/03/18 1728 05/09/18 1148   04/29/18 0400  ceFAZolin (ANCEF) IVPB 2g/100 mL premix     2 g 200 mL/hr over 30 Minutes Intravenous Every 8 hours 04/29/18 0044 04/29/18 1259       Assessment/Plan Current smoker COPD HTN HLD Paroxysmal atrial fibrillation - to start Eliquis today AKI  Ruptured abdominal aortic aneurysm with open repair, 04/29/2019, Dr. Tawanna Cooler Early  Postop ileus versus SBO -patient had a large BM and feels significantly better.  Her NGT output is essentially none since yesterday.   -x-rays are  significantly improved today.   -suspect a degree of obstruction from obstipation given rapid improvement after BM.  Nonetheless, given this is the second occurrence post op while go a little slow.  Will clamp NGT today and allow ice, few sips, etc.  If she tolerates this today, then will try clear liquids tomorrow.  FEN: NGT clamped ID: None DVT: heparin   LOS: 27 days    Letha Cape , [facility] 05/25/2018, 7:53 AM Pager: [phone]

## 2018-05-25 NOTE — Progress Notes (Signed)
  Progress Note    05/25/2018 7:36 AM 27 Days Post-Op  Subjective:  Feels much better after "10lb bowel movement".  Denies any nausea/vomiting.  Afebrile HR 90's-120's systolic HR 60's-70's NSR 94% [id number]  Vitals:   05/25/18 0534 05/25/18 0734  BP: (!) 93/48   Pulse: 63 65  Resp:  16  Temp:    SpO2:      Physical Exam: Cardiac:  regular Lungs:  Non labored Incisions:  Clean and dry without drainage Extremities:  Pitting edema BLE Abdomen:  Softer today; +BM  CBC    Component Value Date/Time   WBC 8.5 05/25/2018 0242   RBC 3.74 (L) 05/25/2018 0242   HGB 10.7 (L) 05/25/2018 0242   HCT 34.3 (L) 05/25/2018 0242   PLT 175 05/25/2018 0242   MCV 91.7 05/25/2018 0242   MCH 28.6 05/25/2018 0242   MCHC 31.2 05/25/2018 0242   RDW 16.3 (H) 05/25/2018 0242   LYMPHSABS 0.8 05/15/2018 0657   MONOABS 2.0 (H) 05/15/2018 0657   EOSABS 0.1 05/15/2018 0657   BASOSABS 0.1 05/15/2018 0657    BMET    Component Value Date/Time   NA 135 05/25/2018 0242   K 4.1 05/25/2018 0242   CL 94 (L) 05/25/2018 0242   CO2 33 (H) 05/25/2018 0242   GLUCOSE 112 (H) 05/25/2018 0242   BUN 42 (H) 05/25/2018 0242   CREATININE 1.41 (H) 05/25/2018 0242   CALCIUM 7.8 (L) 05/25/2018 0242   GFRNONAA 37 (L) 05/25/2018 0242   GFRAA 43 (L) 05/25/2018 0242    INR    Component Value Date/Time   INR 1.16 04/30/2018 0413     Intake/Output Summary (Last 24 hours) at 05/25/2018 0736 Last data filed at 05/25/2018 0410 Gross per 24 hour  Intake 857.03 ml  Output 2175 ml  Net -1317.97 ml     Assessment:  73 y.o. female is s/p:  Repair rupture AAA with recurrent ileus vs SBO  27 Days Post-Op  Plan: -pt states she had a very large BM this morning and she feels a lot better.   NGT with 325cc/24hr and 200cc last shift.  Appreciate general surgery help with this pt.   -needs to ambulate more and oob  -DVT prophylaxis:  Pt on heparin gtt at this time   Doreatha Massed, PA-C [facility] [phone] 05/25/2018 7:36 AM

## 2018-05-25 NOTE — Progress Notes (Signed)
ANTICOAGULATION CONSULT NOTE   Pharmacy Consult for Apixaban>>Heparin Indication: atrial fibrillation  Allergies  Allergen Reactions  . Hydrochlorothiazide     Patient Measurements: Height: 5\' 4"  (162.6 cm) Weight: 178 lb 8 oz (81 kg) IBW/kg (Calculated) : 54.7 Heparin Dosing Weight: 80kg  Vital Signs: Temp: 98.7 F (37.1 C) (02/27 0742) Temp Source: Axillary (02/27 0742) BP: 100/54 (02/27 0742) Pulse Rate: 63 (02/27 0742)  Labs: Recent Labs    05/23/18 0818  05/23/18 0947 05/24/18 0523 05/24/18 2202 05/25/18 0242  HGB  --    < > 12.0 10.8*  --  10.7*  HCT  --   --  37.3 33.9*  --  34.3*  PLT  --   --  168 167  --  175  APTT  --   --   --   --  67* 75*  HEPARINUNFRC  --   --   --   --   --  >2.20*  CREATININE 1.42*  --   --  1.39*  --  1.41*   < > = values in this interval not displayed.    Estimated Creatinine Clearance: 37.7 mL/min (A) (by C-G formula based on SCr of 1.41 mg/dL (H)).   Medical History: Past Medical History:  Diagnosis Date  . BACK PAIN 03/14/2009  . COPD 11/01/2006  . GLUCOSE INTOLERANCE 03/14/2009  . Headache(784.0) 03/14/2009  . HYPERTENSION 11/01/2006  . 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 INSOMNIA, HX OF 01/06/2007  . Other and unspecified hyperlipidemia 08/22/2013  . Urinary frequency 03/14/2009   Assessment: 73 year old female with afib on apixaban for the past few days. Converted to NSR 2/25 and is still there today. New concerns for SBP vs ileus and apixaban stopped and will transition patient over to heparin this am.   Will monitor and dose based on aptt's for the next couple days until heparin levels correlate.   2/27: Heparin level >2.2 as expected, aptt at goal at 75s. No bleeding noted, cbc stable. Large BM yesterday.   Goal of Therapy:  Heparin level 0.3-0.7 units/ml aPTT 66-102 seconds Monitor platelets by anticoagulation protocol: Yes   Plan:  Cont heparin at 1100 units/hr  Sheppard Coil PharmD.,  BCPS Clinical Pharmacist 05/25/2018 8:38 AM

## 2018-05-26 ENCOUNTER — Inpatient Hospital Stay (HOSPITAL_COMMUNITY): Payer: Medicare PPO

## 2018-05-26 LAB — CBC
HEMATOCRIT: 32.1 % — AB (ref 36.0–46.0)
Hemoglobin: 10.4 g/dL — ABNORMAL LOW (ref 12.0–15.0)
MCH: 29.6 pg (ref 26.0–34.0)
MCHC: 32.4 g/dL (ref 30.0–36.0)
MCV: 91.5 fL (ref 80.0–100.0)
Platelets: 167 10*3/uL (ref 150–400)
RBC: 3.51 MIL/uL — ABNORMAL LOW (ref 3.87–5.11)
RDW: 16.1 % — ABNORMAL HIGH (ref 11.5–15.5)
WBC: 8.7 10*3/uL (ref 4.0–10.5)
nRBC: 0 % (ref 0.0–0.2)

## 2018-05-26 LAB — RENAL FUNCTION PANEL
Albumin: 2.2 g/dL — ABNORMAL LOW (ref 3.5–5.0)
Anion gap: 9 (ref 5–15)
BUN: 28 mg/dL — ABNORMAL HIGH (ref 8–23)
CO2: 30 mmol/L (ref 22–32)
Calcium: 7.9 mg/dL — ABNORMAL LOW (ref 8.9–10.3)
Chloride: 93 mmol/L — ABNORMAL LOW (ref 98–111)
Creatinine, Ser: 1.18 mg/dL — ABNORMAL HIGH (ref 0.44–1.00)
GFR calc Af Amer: 54 mL/min — ABNORMAL LOW (ref >60)
GFR calc non Af Amer: 46 mL/min — ABNORMAL LOW (ref >60)
GLUCOSE: 111 mg/dL — AB (ref 70–99)
Phosphorus: 2.3 mg/dL — ABNORMAL LOW (ref 2.5–4.6)
Potassium: 3.1 mmol/L — ABNORMAL LOW (ref 3.5–5.1)
Sodium: 132 mmol/L — ABNORMAL LOW (ref 135–145)

## 2018-05-26 LAB — APTT: aPTT: 55 seconds — ABNORMAL HIGH (ref 24–36)

## 2018-05-26 LAB — GLUCOSE, CAPILLARY
Glucose-Capillary: 107 mg/dL — ABNORMAL HIGH (ref 70–99)
Glucose-Capillary: 94 mg/dL (ref 70–99)
Glucose-Capillary: 148 mg/dL — ABNORMAL HIGH (ref 70–99)
Glucose-Capillary: 105 mg/dL — ABNORMAL HIGH (ref 70–99)

## 2018-05-26 LAB — HEPARIN LEVEL (UNFRACTIONATED): Heparin Unfractionated: 1.1 IU/mL — ABNORMAL HIGH (ref 0.30–0.70)

## 2018-05-26 NOTE — Progress Notes (Signed)
Patient ID: Leah Mata, female   DOB: 05-27-1945, 73 y.o.   MRN: [id number] 28 Days Post-Op   Subjective: No complaints this morning.  Several very large bowel movements and a lot of flatus yesterday.  Denies pain or distention this morning.  NG tube has been clamped overnight.  Objective: Vital signs in last 24 hours: Temp:  [97.7 F (36.5 C)-98.2 F (36.8 C)] 98.1 F (36.7 C) (02/28 0607) Pulse Rate:  [61-70] 70 (02/28 0607) Resp:  [13-19] 18 (02/28 0607) BP: (90-109)/(48-58) 109/50 (02/28 0607) SpO2:  [93 %-99 %] 94 % (02/28 0607) Weight:  [80 kg] 80 kg (02/28 0607) Last BM Date: 05/25/18  Intake/Output from previous day: 02/27 0701 - 02/28 0700 In: 1675.9 [P.O.:240; I.V.:1435.9] Out: 1700 [Urine:1700] Intake/Output this shift: No intake/output data recorded.  General appearance: alert, cooperative and no distress GI: Soft and nontender without appreciable distention  Lab Results:  Recent Labs    05/24/18 0523 05/25/18 0242  WBC 9.7 8.5  HGB 10.8* 10.7*  HCT 33.9* 34.3*  PLT 167 175   BMET Recent Labs    05/24/18 0523 05/25/18 0242  NA 137 135  K 4.5 4.1  CL 95* 94*  CO2 32 33*  GLUCOSE 103* 112*  BUN 45* 42*  CREATININE 1.39* 1.41*  CALCIUM 8.2* 7.8*     Studies/Results: Dg Abd Portable 1v  Result Date: 05/25/2018 CLINICAL DATA:  Small bowel obstruction. EXAM: PORTABLE ABDOMEN - 1 VIEW COMPARISON:  Radiograph of May 24, 2018. FINDINGS: Improved small bowel dilatation is noted compared to prior exam. Nasogastric tube is been partially withdrawn, with distal tip in proximal stomach and side hole at expected position of gastroesophageal junction. No colonic dilatation is noted. IMPRESSION: Improved small bowel dilatation is noted suggesting improving ileus or small-bowel obstruction. Nasogastric tube has been partially withdrawn as described above. Electronically Signed   By: Lupita Raider, M.D.   On: 05/25/2018 08:44   Dg Abd Portable 1v  Result  Date: 05/24/2018 CLINICAL DATA:  Nasogastric tube placement. EXAM: PORTABLE ABDOMEN - 1 VIEW COMPARISON:  Radiographs of May 23, 2018. FINDINGS: Nasogastric tube tip is seen in expected position of distal stomach. Stable small bowel dilatation is noted concerning for distal small bowel obstruction. No colonic dilatation is noted. IMPRESSION: Stable findings consistent with distal small bowel obstruction. Interval placement of nasogastric tube with tip in expected position of distal stomach. Electronically Signed   By: Lupita Raider, M.D.   On: 05/24/2018 12:01    Anti-infectives: Anti-infectives (From admission, onward)   Start     Dose/Rate Route Frequency Ordered Stop   05/03/18 1815  cefTRIAXone (ROCEPHIN) 1 g in sodium chloride 0.9 % 100 mL IVPB  Status:  Discontinued     1 g 200 mL/hr over 30 Minutes Intravenous Every 24 hours 05/03/18 1728 05/09/18 1148   04/29/18 0400  ceFAZolin (ANCEF) IVPB 2g/100 mL premix     2 g 200 mL/hr over 30 Minutes Intravenous Every 8 hours 04/29/18 0044 04/29/18 1259      Assessment/Plan: Recurrent or persistent small bowel obstruction status post abdominal aortic aneurysm repair.  Now however with good bowel function and marked improvement in her x-rays yesterday.  Asymptomatic. Check repeat x-rays today.  If continued improvement remove NG and start clear liquids.    LOS: 28 days    Mariella Saa 05/26/2018

## 2018-05-26 NOTE — Progress Notes (Signed)
Progress Note  Patient Name: Leah Mata Date of Encounter: 05/26/2018  Primary Cardiologist: Kristeen Miss, MD   Subjective   Maintaining NSR. Improved abdominal symptoms.  Inpatient Medications    Scheduled Meds: . chlorhexidine  15 mL Mouth Rinse BID  . sennosides  10 mL Per Tube QHS   And  . docusate  100 mg Per Tube QHS  . fluticasone  2 spray Each Nare Daily  . furosemide  80 mg Intravenous Q8H  . insulin aspart  0-15 Units Subcutaneous TID WC & HS  . ipratropium-albuterol  3 mL Nebulization TID  . mouth rinse  15 mL Mouth Rinse q12n4p  . metoprolol tartrate  2.5 mg Intravenous Q6H  . pantoprazole  40 mg Oral BID  . potassium chloride  40 mEq Oral BID  . simethicone  80 mg Oral TID  . sodium chloride flush  10-40 mL Intracatheter Q12H   Continuous Infusions: . sodium chloride 250 mL (05/14/18 1211)  . dextrose 5 % and 0.45% NaCl 50 mL/hr at 05/26/18 0728  . heparin 1,100 Units/hr (05/26/18 0801)  . magnesium sulfate 1 - 4 g bolus IVPB     PRN Meds: sodium chloride, ALPRAZolam, alum & mag hydroxide-simeth, bisacodyl, guaiFENesin-dextromethorphan, hydrALAZINE, levalbuterol, LORazepam, magnesium sulfate 1 - 4 g bolus IVPB, morphine injection, ondansetron, oxyCODONE-acetaminophen, phenol, sodium chloride flush, sodium chloride flush   Vital Signs    Vitals:   05/25/18 2300 05/26/18 0000 05/26/18 0607 05/26/18 0902  BP:  (!) 98/50 (!) 109/50   Pulse: 64 70 70 68  Resp: 19 17 18 19   Temp:  97.9 F (36.6 C) 98.1 F (36.7 C)   TempSrc:  Oral Oral   SpO2: 94% 97% 94% 92%  Weight:   80 kg   Height:        Intake/Output Summary (Last 24 hours) at 05/26/2018 0958 Last data filed at 05/26/2018 0800 Gross per 24 hour  Intake 1795.89 ml  Output 1500 ml  Net 295.89 ml   Last 3 Weights 05/26/2018 05/25/2018 05/24/2018  Weight (lbs) 176 lb 5.9 oz 178 lb 8 oz 176 lb 9.4 oz  Weight (kg) 80 kg 80.967 kg 80.1 kg      Telemetry    NSR, frequent PACs - Personally  Reviewed  ECG    No new tracing - Personally Reviewed  Physical Exam  Walking in hallway, clamped NGT still in place GEN: No acute distress.   Neck: No JVD Cardiac: RRR, no murmurs, rubs, or gallops.  Respiratory: Clear to auscultation bilaterally. GI: Soft, nontender, non-distended  MS: No edema; No deformity. Neuro:  Nonfocal  Psych: Normal affect   Labs    Chemistry Recent Labs  Lab 05/20/18 0539  05/23/18 0818 05/24/18 0523 05/25/18 0242  NA 135   < > 133* 137 135  K 3.1*   < > 4.5 4.5 4.1  CL 97*   < > 96* 95* 94*  CO2 28   < > 27 32 33*  GLUCOSE 95   < > 99 103* 112*  BUN 73*   < > 51* 45* 42*  CREATININE 1.96*   < > 1.42* 1.39* 1.41*  CALCIUM 7.6*   < > 7.7* 8.2* 7.8*  PROT 4.8*  --   --   --   --   ALBUMIN 1.8*  --  2.0* 2.2* 2.1*  AST 27  --   --   --   --   ALT 31  --   --   --   --  ALKPHOS 100  --   --   --   --   BILITOT 1.1  --   --   --   --   GFRNONAA 25*   < > 37* 38* 37*  GFRAA 29*   < > 43* 44* 43*  ANIONGAP 10   < > 10 10 8    < > = values in this interval not displayed.     Hematology Recent Labs  Lab 05/23/18 0947 05/24/18 0523 05/25/18 0242  WBC 10.3 9.7 8.5  RBC 4.07 3.73* 3.74*  HGB 12.0 10.8* 10.7*  HCT 37.3 33.9* 34.3*  MCV 91.6 90.9 91.7  MCH 29.5 29.0 28.6  MCHC 32.2 31.9 31.2  RDW 16.6* 16.4* 16.3*  PLT 168 167 175    Cardiac EnzymesNo results for input(s): TROPONINI in the last 168 hours. No results for input(s): TROPIPOC in the last 168 hours.   BNPNo results for input(s): BNP, PROBNP in the last 168 hours.   DDimer No results for input(s): DDIMER in the last 168 hours.   Radiology    Dg Abd Portable 1v  Result Date: 05/25/2018 CLINICAL DATA:  Small bowel obstruction. EXAM: PORTABLE ABDOMEN - 1 VIEW COMPARISON:  Radiograph of May 24, 2018. FINDINGS: Improved small bowel dilatation is noted compared to prior exam. Nasogastric tube is been partially withdrawn, with distal tip in proximal stomach and side hole  at expected position of gastroesophageal junction. No colonic dilatation is noted. IMPRESSION: Improved small bowel dilatation is noted suggesting improving ileus or small-bowel obstruction. Nasogastric tube has been partially withdrawn as described above. Electronically Signed   By: Lupita Raider, M.D.   On: 05/25/2018 08:44    Cardiac Studies   Echo May 03, 2018 1. The left ventricle has hyperdynamic systolic function of >65%. The cavity size is normal. There is no increased left ventricular wall thickness. Echo evidence of impaired diastolic relaxation. 2. The right ventricle has normal systolic function. The cavity in normal in size. There is no increase in right ventricular wall thickness. 3. The mitral valve is normal in structure. 4. The aortic valve has an indeterminant number of cusps. 5. The pulmonic valve is Not well seen. Pulmonic valve regurgitation was not assessed by color flow Doppler. 6. There is mild dilatation of the aortic root. 7. The interatrial septum was not assessed. 8. Extremely limited due to poor sound wave transmission; definity used; vigorous LV systolic function; mild diastolic dysfunction.   Patient Profile     73 y.o. female status post emergency repair for ruptured abdominal aortic aneurysm, postop course complicated by atrial fibrillation with rapid ventricular response andsubsequentlyatrial flutter with 2:1 AV block,now back in normal rhythm.Recurrent problems with postop ileus, NG placed again on 2/26.  Assessment & Plan     1.AFib/Aflutter:Maintaining sinus rhythm.On IV heparin and metoprolol as long as unable to take Eliquis PO.Once NGT removed, switch back to Eliquis 5 mg BID and metoprolol 50 mg BID. She has had both atrial fibrillation and atrial flutter, there is little benefit from ablation. The arrhythmia itself is asymptomatic and she does not require antiarrhythmics. I suspect that thearrhythmia likely precedes the  aortic aneurysm rupture and her acute illness. 2. XBJ:YNWGNFAOZH euvolemic,still about 12-13 lb above her usual weight (some of that may still be in her intestines). 3. YQM:VHQIONGEXB appears stable around 1.4, 4. COPD:He has decided to quit smoking permanently. Underlying chronic lung disease may be the substrate for her tendency to have atrial flutter.   [facility]  will sign off.   Medication Recommendations:  Eliquis 5 mg BID and metoprolol 50 mg BID once able to take PO Other recommendations (labs, testing, etc):  n/a Follow up as an outpatient:   F/U Dr. Elease HashimotoNahser 1 month  For questions or updates, please contact [facility] Please consult [email or web address] for contact info under        Signed, Thurmon FairMihai Jessia Kief, MD  05/26/2018, 9:58 AM

## 2018-05-26 NOTE — Progress Notes (Addendum)
  Progress Note    05/26/2018 7:32 AM 28 Days Post-Op  Subjective:  Feeling better today.  Denies nausea; passing gas  Afebrile HR 60's-70's NSR 90's-100's systolic 94% 2LO2NC  Vitals:   05/26/18 0000 05/26/18 0607  BP: (!) 98/50 (!) 109/50  Pulse: 70 70  Resp: 17 18  Temp: 97.9 F (36.6 C) 98.1 F (36.7 C)  SpO2: 97% 94%    Physical Exam: Cardiac:  regular Lungs:  Non labored Incisions:  Clean and dry Extremities:  Still with pitting edema BLE but less today Abdomen:  Softer today; +flatus  CBC    Component Value Date/Time   WBC 8.5 05/25/2018 0242   RBC 3.74 (L) 05/25/2018 0242   HGB 10.7 (L) 05/25/2018 0242   HCT 34.3 (L) 05/25/2018 0242   PLT 175 05/25/2018 0242   MCV 91.7 05/25/2018 0242   MCH 28.6 05/25/2018 0242   MCHC 31.2 05/25/2018 0242   RDW 16.3 (H) 05/25/2018 0242   LYMPHSABS 0.8 05/15/2018 0657   MONOABS 2.0 (H) 05/15/2018 0657   EOSABS 0.1 05/15/2018 0657   BASOSABS 0.1 05/15/2018 0657    BMET    Component Value Date/Time   NA 135 05/25/2018 0242   K 4.1 05/25/2018 0242   CL 94 (L) 05/25/2018 0242   CO2 33 (H) 05/25/2018 0242   GLUCOSE 112 (H) 05/25/2018 0242   BUN 42 (H) 05/25/2018 0242   CREATININE 1.41 (H) 05/25/2018 0242   CALCIUM 7.8 (L) 05/25/2018 0242   GFRNONAA 37 (L) 05/25/2018 0242   GFRAA 43 (L) 05/25/2018 0242    INR    Component Value Date/Time   INR 1.16 04/30/2018 0413     Intake/Output Summary (Last 24 hours) at 05/26/2018 0732 Last data filed at 05/26/2018 4076 Gross per 24 hour  Intake 1675.89 ml  Output 1700 ml  Net -24.11 ml     Assessment:  73 y.o. female is s/p:  Repair rupture AAA with recurrent ileus vs SBO  28 Days Post-Op  Plan: -pt doing well this morning-no nausea with tube clamped.  Advance diet per general surgery -mobilizing well -off oxygen this morning and O2 sats are mid 90's -DVT prophylaxis:  Heparin gtt   Doreatha Massed, PA-C [facility] [phone] 05/26/2018 7:32 AM  I have examined the patient, reviewed and agree with above.  Gretta Began, MD 05/26/2018 1:54 PM

## 2018-05-26 NOTE — Progress Notes (Signed)
Pt with less edema and demonstrating ability to cross foot over opposite knee to reach feet for ADL. Pt declined up to chair. Eager to begin eating solid food.   05/26/18 1200  OT Visit Information  Last OT Received On 05/26/18  Assistance Needed +1  History of Present Illness Pt is a 73 y.o. F with significant PMH of COPD who presents with abdominal and right flank pain. 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 S/p aneurysm abdominal aortic repair. Hospitalization complicated by worsening SOB requiring bipap on 2/4 due to pulmonary effusion, tachycardia 2/5 requiring amiodorone drip.  Precautions  Precautions Fall  Precaution Comments watch HR, NGT (clamped)  Pain Assessment  Pain Assessment No/denies pain  Cognition  Arousal/Alertness Awake/alert  Behavior During Therapy WFL for tasks assessed/performed  Overall Cognitive Status Within Functional Limits for tasks assessed  General Comments pt eager to get results of earlier xray of gut so she can eat  ADL  Overall ADL's  Needs assistance/impaired  Lower Body Dressing Set up;Sit to/from stand  Lower Body Dressing Details (indicate cue type and reason) pt able to don socks without AE this visit now that edema is better  Bed Mobility  Overal bed mobility Modified Independent  Balance  Overall balance assessment Needs assistance  Sitting balance-[name] Good  Transfers  Overall transfer level Needs assistance  Equipment used 4-wheeled walker  Transfers Sit to/from Stand  Sit to Stand Supervision  General transfer comment stood with rollator and cues for hand placement and took several steps to [facility], declined up in chair  OT - End of Session  Equipment Utilized During Treatment Oxygen  Activity Tolerance Patient tolerated treatment well  Patient left in bed;with call bell/phone within reach;with nursing/sitter in room  Nurse Communication Other (comment) (ok to  have [place] ice)  OT Assessment/Plan  OT Plan Discharge plan remains appropriate  OT Visit Diagnosis Unsteadiness on feet (R26.81);Other abnormalities of gait and mobility (R26.89);Muscle weakness (generalized) (M62.81);Pain  OT Frequency (ACUTE ONLY) Min 2X/week  Follow Up Recommendations Home health OT  OT Equipment Tub/shower seat;3 in 1 bedside commode  AM-PAC OT "6 Clicks" Daily Activity Outcome Measure (Version 2)  Help from another person eating meals? 4  Help from another person taking care of personal grooming? 3  Help from another person toileting, which includes using toliet, bedpan, or urinal? 3  Help from another person bathing (including washing, rinsing, drying)? 3  Help from another person to put on and taking off regular upper body clothing? 4  Help from another person to put on and taking off regular lower body clothing? 3  6 Click Score 20  OT Goal Progression  Progress towards OT goals Progressing toward goals  Acute Rehab OT Goals  Patient Stated Goal to get home  OT Goal Formulation With patient  Time For Goal Achievement 05/31/18  Potential to Achieve Goals Good  OT Time Calculation  OT Start Time (ACUTE ONLY) 1101  OT Stop Time (ACUTE ONLY) 1114  OT Time Calculation (min) 13 min  OT General Charges  $OT Visit 1 Visit  OT Treatments  $Self Care/Home Management  8-22 mins  Martie Round, OTR/L Acute Rehabilitation Services Pager: [phone] Office: [phone]

## 2018-05-26 NOTE — Progress Notes (Signed)
Physical Therapy Treatment Patient Details Name: Leah Mata MRN: [id number] DOB: [birth year] Today's Date: 05/26/2018    History of Present Illness Pt is a 73 y.o. F with significant PMH of COPD who presents with abdominal and right flank pain. 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 S/p aneurysm abdominal aortic repair. Hospitalization complicated by worsening SOB requiring bipap on 2/4 due to pulmonary effusion, tachycardia 2/5 requiring amiodorone drip.    PT Comments    Patient seen for continued mobility progression - patient reporting general fatigue limiting as she has had a busy morning. Ambulating in hallway with rollator with 2L O2 - SpO2 >90% throughout.  Does require education on safety with rollator as she tried to sit in the middle of hallway - cueing to place against wall for safety. MAkingg ood progress towards all goals.    Follow Up Recommendations  Home health PT;Supervision/Assistance - 24 hour(initially for patient comfort)     Equipment Recommendations  3in1 (PT);Other (comment)(rollator 4 wheels)    Recommendations for Other Services       Precautions / Restrictions Precautions Precautions: Fall Precaution Comments: watch HR, MD anticipates it staying in mid 120s Restrictions Weight Bearing Restrictions: No    Mobility  Bed Mobility Overal bed mobility: Modified Independent Bed Mobility: Supine to Sit;Sit to Supine              Transfers Overall transfer level: Needs assistance Equipment used: 4-wheeled walker Transfers: Sit to/from [employer] Transfers Sit to Stand: Min guard;Supervision Stand pivot transfers: Min guard;Supervision       General transfer comment: for safety and sequencing; when going to sit on rollator on hallway cueing to place against wall to prevent device from rolling  Ambulation/Gait Ambulation/Gait assistance: Min guard;Supervision Gait  Distance (Feet): 400 Feet Assistive device: 4-wheeled walker Gait Pattern/deviations: Step-through pattern;Decreased stride length;Wide base of support;Trunk flexed Gait velocity: decreased   General Gait Details: good tolerance to mobility - some limitations due to fatigue as patient had been up since 4 am and busy with testing   Stairs             Wheelchair Mobility    Modified Rankin (Stroke Patients Only)       Balance Overall balance assessment: Needs assistance Sitting-balance support: No upper extremity supported;Feet supported Sitting balance-[name]: Good     Standing balance support: Bilateral upper extremity supported;During functional activity Standing balance-[name]: Good                              Cognition Arousal/Alertness: Awake/alert Behavior During Therapy: WFL for tasks assessed/performed Overall Cognitive Status: Within Functional Limits for tasks assessed                                        Exercises      General Comments General comments (skin integrity, edema, etc.): friend present; VSS throughout on 2L O2       Pertinent Vitals/Pain Pain Assessment: No/denies pain    Home Living                      Prior Function            PT Goals (current goals can now be found in the care plan section) Acute Rehab  PT Goals Patient Stated Goal: to get home PT Goal Formulation: With patient Time For Goal Achievement: 06/08/18 Potential to Achieve Goals: Good Progress towards PT goals: Progressing toward goals    Frequency    Min 3X/week      PT Plan Current plan remains appropriate    Co-evaluation              AM-PAC PT "6 Clicks" Mobility   Outcome Measure  Help needed turning from your back to your side while in a flat bed without using bedrails?: None Help needed moving from lying on your back to sitting on the side of a flat bed without using bedrails?: None Help  needed moving to and from a bed to a chair (including a wheelchair)?: A Little Help needed standing up from a chair using your arms (e.g., wheelchair or bedside chair)?: A Little Help needed to walk in hospital room?: A Little Help needed climbing 3-5 steps with a railing? : A Lot 6 Click Score: 19    End of Session Equipment Utilized During Treatment: Gait belt;Oxygen Activity Tolerance: Patient tolerated treatment well Patient left: in bed;with call bell/phone within reach;with family/visitor present Nurse Communication: Mobility status PT Visit Diagnosis: Unsteadiness on feet (R26.81);Muscle weakness (generalized) (M62.81)     Time: 9379-0240 PT Time Calculation (min) (ACUTE ONLY): 17 min  Charges:  $Gait Training: 8-22 mins                     Kipp Laurence, PT, DPT Supplemental Physical Therapist 05/26/18 10:52 AM Pager: [phone] Office: [phone]

## 2018-05-26 NOTE — Progress Notes (Signed)
ANTICOAGULATION CONSULT NOTE   Pharmacy Consult for Apixaban>>Heparin Indication: atrial fibrillation  Allergies  Allergen Reactions  . Hydrochlorothiazide     Patient Measurements: Height: 5\' 4"  (162.6 cm) Weight: 176 lb 5.9 oz (80 kg) IBW/kg (Calculated) : 54.7 Heparin Dosing Weight: 80kg  Vital Signs: Temp: 98.1 F (36.7 C) (02/28 0607) Temp Source: Oral (02/28 0607) BP: 109/50 (02/28 0607) Pulse Rate: 70 (02/28 0607)  Labs: Recent Labs    05/23/18 0947 05/24/18 0523 05/24/18 2202 05/25/18 0242  HGB 12.0 10.8*  --  10.7*  HCT 37.3 33.9*  --  34.3*  PLT 168 167  --  175  APTT  --   --  67* 75*  HEPARINUNFRC  --   --   --  >2.20*  CREATININE  --  1.39*  --  1.41*    Estimated Creatinine Clearance: 37.4 mL/min (A) (by C-G formula based on SCr of 1.41 mg/dL (H)).   Medical History: Past Medical History:  Diagnosis Date  . BACK PAIN 03/14/2009  . COPD 11/01/2006  . GLUCOSE INTOLERANCE 03/14/2009  . Headache(784.0) 03/14/2009  . HYPERTENSION 11/01/2006  . 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 INSOMNIA, HX OF 01/06/2007  . Other and unspecified hyperlipidemia 08/22/2013  . Urinary frequency 03/14/2009   Assessment: 73 year old female with afib on apixaban for the past few days. Converted to NSR 2/25 and is still there today. New concerns for SBP vs ileus and apixaban stopped and will transition patient over to heparin this am.   Will monitor and dose based on aptt's for the next couple days until heparin levels correlate.   2/28Heparin level >1.1, aptt low at 55s. No bleeding issues noted.  Goal of Therapy:  Heparin level 0.3-0.7 units/ml aPTT 66-102 seconds Monitor platelets by anticoagulation protocol: Yes   Plan:  Cont heparin at 1250 units/hr Recheck aptt in am  Sheppard Coil PharmD., BCPS Clinical Pharmacist 05/26/2018 8:54 AM

## 2018-05-27 LAB — APTT: aPTT: 108 seconds — ABNORMAL HIGH (ref 24–36)

## 2018-05-27 LAB — RENAL FUNCTION PANEL
Albumin: 2 g/dL — ABNORMAL LOW (ref 3.5–5.0)
Anion gap: 6 (ref 5–15)
BUN: 21 mg/dL (ref 8–23)
CHLORIDE: 95 mmol/L — AB (ref 98–111)
CO2: 33 mmol/L — ABNORMAL HIGH (ref 22–32)
Calcium: 7.5 mg/dL — ABNORMAL LOW (ref 8.9–10.3)
Creatinine, Ser: 1.18 mg/dL — ABNORMAL HIGH (ref 0.44–1.00)
GFR calc Af Amer: 54 mL/min — ABNORMAL LOW (ref >60)
GFR calc non Af Amer: 46 mL/min — ABNORMAL LOW (ref >60)
Glucose, Bld: 124 mg/dL — ABNORMAL HIGH (ref 70–99)
Phosphorus: 2.3 mg/dL — ABNORMAL LOW (ref 2.5–4.6)
Potassium: 3.3 mmol/L — ABNORMAL LOW (ref 3.5–5.1)
Sodium: 134 mmol/L — ABNORMAL LOW (ref 135–145)

## 2018-05-27 LAB — CBC
HEMATOCRIT: 30.2 % — AB (ref 36.0–46.0)
Hemoglobin: 9.6 g/dL — ABNORMAL LOW (ref 12.0–15.0)
MCH: 29.1 pg (ref 26.0–34.0)
MCHC: 31.8 g/dL (ref 30.0–36.0)
MCV: 91.5 fL (ref 80.0–100.0)
Platelets: 171 10*3/uL (ref 150–400)
RBC: 3.3 MIL/uL — ABNORMAL LOW (ref 3.87–5.11)
RDW: 15.9 % — ABNORMAL HIGH (ref 11.5–15.5)
WBC: 7.9 10*3/uL (ref 4.0–10.5)
nRBC: 0 % (ref 0.0–0.2)

## 2018-05-27 LAB — GLUCOSE, CAPILLARY
GLUCOSE-CAPILLARY: 109 mg/dL — AB (ref 70–99)
Glucose-Capillary: 111 mg/dL — ABNORMAL HIGH (ref 70–99)
Glucose-Capillary: 114 mg/dL — ABNORMAL HIGH (ref 70–99)
Glucose-Capillary: 128 mg/dL — ABNORMAL HIGH (ref 70–99)

## 2018-05-27 LAB — HEPARIN LEVEL (UNFRACTIONATED): Heparin Unfractionated: 1.02 IU/mL — ABNORMAL HIGH (ref 0.30–0.70)

## 2018-05-27 MED ORDER — APIXABAN 5 MG PO TABS
5.0000 mg | ORAL_TABLET | Freq: Two times a day (BID) | ORAL | Status: DC
  Administered 2018-05-27 – 2018-05-30 (×7): 5 mg via ORAL
  Filled 2018-05-27 (×7): qty 1

## 2018-05-27 NOTE — Progress Notes (Signed)
  Progress Note    05/27/2018 11:01 AM 29 Days Post-Op  Subjective: Feeling better tolerated clear liquids NG tube is out having flatus previously had bowel movement  Vitals:   05/27/18 0637 05/27/18 0746  BP:    Pulse:    Resp: 19   Temp:    SpO2:  96%    Physical Exam: Awake alert oriented Nonlabored respirations Incisions clean dry intact Abdomen is soft Feet are warm  CBC    Component Value Date/Time   WBC 7.9 05/27/2018 0318   RBC 3.30 (L) 05/27/2018 0318   HGB 9.6 (L) 05/27/2018 0318   HCT 30.2 (L) 05/27/2018 0318   PLT 171 05/27/2018 0318   MCV 91.5 05/27/2018 0318   MCH 29.1 05/27/2018 0318   MCHC 31.8 05/27/2018 0318   RDW 15.9 (H) 05/27/2018 0318   LYMPHSABS 0.8 05/15/2018 0657   MONOABS 2.0 (H) 05/15/2018 0657   EOSABS 0.1 05/15/2018 0657   BASOSABS 0.1 05/15/2018 0657    BMET    Component Value Date/Time   NA 134 (L) 05/27/2018 0318   K 3.3 (L) 05/27/2018 0318   CL 95 (L) 05/27/2018 0318   CO2 33 (H) 05/27/2018 0318   GLUCOSE 124 (H) 05/27/2018 0318   BUN 21 05/27/2018 0318   CREATININE 1.18 (H) 05/27/2018 0318   CALCIUM 7.5 (L) 05/27/2018 0318   GFRNONAA 46 (L) 05/27/2018 0318   GFRAA 54 (L) 05/27/2018 0318    INR    Component Value Date/Time   INR 1.16 04/30/2018 0413     Intake/Output Summary (Last 24 hours) at 05/27/2018 1101 Last data filed at 05/27/2018 7673 Gross per 24 hour  Intake 2199.41 ml  Output 2445 ml  Net -245.59 ml     Assessment:  73 y.o. female is s/p open repair of ruptured AAA 29 Days Post-Op  Plan: Advancing to full liquids today Out of bed as tolerated On Eliquis  Stonewall Doss C. Randie Heinz, MD [facility] [place] Office: [phone] Pager: [phone]  05/27/2018 11:01 AM

## 2018-05-27 NOTE — Progress Notes (Signed)
29 Days Post-Op   Subjective/Chief Complaint: 3 BMs last night, no nausea   Objective: Vital signs in last 24 hours: Temp:  [97.9 F (36.6 C)-98.7 F (37.1 C)] 98.3 F (36.8 C) (02/29 0420) Pulse Rate:  [68-78] 74 (02/28 1610) Resp:  [19-26] 19 (02/29 0637) BP: (86-115)/(43-66) 107/53 (02/29 0541) SpO2:  [92 %-100 %] 96 % (02/29 0746) Weight:  [80.5 kg] 80.5 kg (02/29 0637) Last BM Date: 05/27/18  Intake/Output from previous day: 02/28 0701 - 02/29 0700 In: 2439.4 [P.O.:940; I.V.:1499.4] Out: 2475 [Urine:2475] Intake/Output this shift: No intake/output data recorded.  General appearance: no distress Resp: clear to auscultation bilaterally GI: soft, NT, ND  Lab Results:  Recent Labs    05/26/18 1301 05/27/18 0318  WBC 8.7 7.9  HGB 10.4* 9.6*  HCT 32.1* 30.2*  PLT 167 171   BMET Recent Labs    05/26/18 1301 05/27/18 0318  NA 132* 134*  K 3.1* 3.3*  CL 93* 95*  CO2 30 33*  GLUCOSE 111* 124*  BUN 28* 21  CREATININE 1.18* 1.18*  CALCIUM 7.9* 7.5*   PT/INR No results for input(s): LABPROT, INR in the last 72 hours. ABG No results for input(s): PHART, HCO3 in the last 72 hours.  Invalid input(s): PCO2, PO2  Studies/Results: Dg Abd 2 Views  Result Date: 05/26/2018 CLINICAL DATA:  Small bowel obstruction. EXAM: ABDOMEN - 2 VIEW COMPARISON:  Radiograph of May 25, 2018. FINDINGS: Distal tip of nasogastric tube is seen in proximal stomach. It is unchanged. No abnormal bowel gas pattern is noted. No abnormal calcifications are noted. IMPRESSION: Distal tip of nasogastric tube seen in proximal stomach. No definite evidence of bowel obstruction or ileus. Electronically Signed   By: Lupita Raider, M.D.   On: 05/26/2018 12:06    Anti-infectives: Anti-infectives (From admission, onward)   Start     Dose/Rate Route Frequency Ordered Stop   05/03/18 1815  cefTRIAXone (ROCEPHIN) 1 g in sodium chloride 0.9 % 100 mL IVPB  Status:  Discontinued     1 g 200 mL/hr  over 30 Minutes Intravenous Every 24 hours 05/03/18 1728 05/09/18 1148   04/29/18 0400  ceFAZolin (ANCEF) IVPB 2g/100 mL premix     2 g 200 mL/hr over 30 Minutes Intravenous Every 8 hours 04/29/18 0044 04/29/18 1259      Assessment/Plan: Recurrent or persistent small bowel obstruction status post abdominal aortic aneurysm repair - improving, advance to fulls. I spoke with her daughter.  LOS: 29 days    Liz Malady 05/27/2018

## 2018-05-27 NOTE — Progress Notes (Signed)
Called to pt's room by son, saying that pt's IV came out. Upon assessment, pt's midline catheter was out, sitting on pt's bedside table. Minimal bleeding noted. Pt stated that it came out while going to the bathroom. Dressing applied. IV team notified.

## 2018-05-27 NOTE — Progress Notes (Signed)
Upon giving evening dose of IV metoprolol, discovered pt's IV was infiltrated. Slight puffiness above IV site noted. No pain or redness. IV fluids stopped and IV removed. Order placed for IV team, as pt is a difficult stick and requires ultrasound for IV placement.

## 2018-05-28 LAB — CBC
HCT: 29.3 % — ABNORMAL LOW (ref 36.0–46.0)
Hemoglobin: 9.4 g/dL — ABNORMAL LOW (ref 12.0–15.0)
MCH: 29.3 pg (ref 26.0–34.0)
MCHC: 32.1 g/dL (ref 30.0–36.0)
MCV: 91.3 fL (ref 80.0–100.0)
Platelets: 158 10*3/uL (ref 150–400)
RBC: 3.21 MIL/uL — ABNORMAL LOW (ref 3.87–5.11)
RDW: 16.1 % — ABNORMAL HIGH (ref 11.5–15.5)
WBC: 6.4 10*3/uL (ref 4.0–10.5)
nRBC: 0 % (ref 0.0–0.2)

## 2018-05-28 LAB — RENAL FUNCTION PANEL
Albumin: 1.9 g/dL — ABNORMAL LOW (ref 3.5–5.0)
Anion gap: 7 (ref 5–15)
BUN: 13 mg/dL (ref 8–23)
CO2: 31 mmol/L (ref 22–32)
Calcium: 7.5 mg/dL — ABNORMAL LOW (ref 8.9–10.3)
Chloride: 98 mmol/L (ref 98–111)
Creatinine, Ser: 1.1 mg/dL — ABNORMAL HIGH (ref 0.44–1.00)
GFR calc Af Amer: 58 mL/min — ABNORMAL LOW (ref >60)
GFR calc non Af Amer: 50 mL/min — ABNORMAL LOW (ref >60)
GLUCOSE: 96 mg/dL (ref 70–99)
POTASSIUM: 3.7 mmol/L (ref 3.5–5.1)
Phosphorus: 2.3 mg/dL — ABNORMAL LOW (ref 2.5–4.6)
Sodium: 136 mmol/L (ref 135–145)

## 2018-05-28 LAB — GLUCOSE, CAPILLARY
Glucose-Capillary: 117 mg/dL — ABNORMAL HIGH (ref 70–99)
Glucose-Capillary: 109 mg/dL — ABNORMAL HIGH (ref 70–99)
Glucose-Capillary: 123 mg/dL — ABNORMAL HIGH (ref 70–99)
Glucose-Capillary: 92 mg/dL (ref 70–99)

## 2018-05-28 NOTE — Progress Notes (Signed)
  Progress Note    05/28/2018 11:49 AM 30 Days Post-Op  Subjective: Tolerated grits and a jelly biscuit this morning  Vitals:   05/28/18 0920 05/28/18 1127  BP: (!) 109/53 (!) 123/52  Pulse: 71 70  Resp: (!) 22 (!) 25  Temp:  98.3 F (36.8 C)  SpO2: 96%     Physical Exam: Awake alert oriented Abdomen is soft nontender Feet are warm with stable edema bilaterally  CBC    Component Value Date/Time   WBC 6.4 05/28/2018 0302   RBC 3.21 (L) 05/28/2018 0302   HGB 9.4 (L) 05/28/2018 0302   HCT 29.3 (L) 05/28/2018 0302   PLT 158 05/28/2018 0302   MCV 91.3 05/28/2018 0302   MCH 29.3 05/28/2018 0302   MCHC 32.1 05/28/2018 0302   RDW 16.1 (H) 05/28/2018 0302   LYMPHSABS 0.8 05/15/2018 0657   MONOABS 2.0 (H) 05/15/2018 0657   EOSABS 0.1 05/15/2018 0657   BASOSABS 0.1 05/15/2018 0657    BMET    Component Value Date/Time   NA 136 05/28/2018 0302   K 3.7 05/28/2018 0302   CL 98 05/28/2018 0302   CO2 31 05/28/2018 0302   GLUCOSE 96 05/28/2018 0302   BUN 13 05/28/2018 0302   CREATININE 1.10 (H) 05/28/2018 0302   CALCIUM 7.5 (L) 05/28/2018 0302   GFRNONAA 50 (L) 05/28/2018 0302   GFRAA 58 (L) 05/28/2018 0302    INR    Component Value Date/Time   INR 1.16 04/30/2018 0413     Intake/Output Summary (Last 24 hours) at 05/28/2018 1149 Last data filed at 05/28/2018 0940 Gross per 24 hour  Intake 1307.03 ml  Output 1550 ml  Net -242.97 ml     Assessment:  73 y.o. female is s/p open repair of ruptured abdominal aortic aneurysm.  She is having bowel function tolerating diet at this point  Plan: Advance to soft diet Eliquis Out of bed to chair as tolerated.  Brandon C. Randie Heinz, MD [facility] [place] Office: [phone] Pager: [phone]  05/28/2018 11:49 AM

## 2018-05-28 NOTE — Progress Notes (Signed)
30 Days Post-Op    CC: SBO  Subjective: She is feeling better, 2 Bm's yesterday, did well with full liquids, walking some.  No abdominal pain.    Objective: Vital signs in last 24 hours: Temp:  [97.6 F (36.4 C)-98.4 F (36.9 C)] 98.2 F (36.8 C) (03/01 0416) Pulse Rate:  [73-76] 74 (03/01 0634) Resp:  [19-26] 20 (03/01 0643) BP: (95-119)/(45-57) 119/57 (03/01 0634) SpO2:  [93 %-98 %] 95 % (03/01 0634) Weight:  [79.5 kg] 79.5 kg (03/01 0643) Last BM Date: 05/28/18 700 PO 800 IV 1820 urine BM x 3 Afebrile, VSS Labs stable No films   Intake/Output from previous day: 02/29 0701 - 03/01 0700 In: 1507 [P.O.:700; I.V.:807] Out: 1820 [Urine:1820] Intake/Output this shift: No intake/output data recorded.  General appearance: alert, up in the chair and says she feels OK GI: soft, + BS, + BM tolerating fulls well.  Lab Results:  Recent Labs    05/27/18 0318 05/28/18 0302  WBC 7.9 6.4  HGB 9.6* 9.4*  HCT 30.2* 29.3*  PLT 171 158    BMET Recent Labs    05/27/18 0318 05/28/18 0302  NA 134* 136  K 3.3* 3.7  CL 95* 98  CO2 33* 31  GLUCOSE 124* 96  BUN 21 13  CREATININE 1.18* 1.10*  CALCIUM 7.5* 7.5*   PT/INR No results for input(s): LABPROT, INR in the last 72 hours.  Recent Labs  Lab 05/24/18 0523 05/25/18 0242 05/26/18 1301 05/27/18 0318 05/28/18 0302  ALBUMIN 2.2* 2.1* 2.2* 2.0* 1.9*     Lipase  No results found for: LIPASE   Medications: . apixaban  5 mg Oral BID  . chlorhexidine  15 mL Mouth Rinse BID  . sennosides  10 mL Per Tube QHS   And  . docusate  100 mg Per Tube QHS  . fluticasone  2 spray Each Nare Daily  . furosemide  80 mg Intravenous Q8H  . insulin aspart  0-15 Units Subcutaneous TID WC & HS  . ipratropium-albuterol  3 mL Nebulization TID  . mouth rinse  15 mL Mouth Rinse q12n4p  . metoprolol tartrate  2.5 mg Intravenous Q6H  . pantoprazole  40 mg Oral BID  . potassium chloride  40 mEq Oral BID  . simethicone  80 mg Oral  TID    Assessment/Plan Current smoker COPD HTN HLD Paroxysmal atrial fibrillation- to start Eliquis today AKI  Ruptured abdominal aortic aneurysm with open repair,04/29/2019, Dr.Todd Early  Postop ileus versus SBO -patient had a large BM and feels significantly better.  Her NGT output is essentially none since yesterday.   -x-rays are significantly improved today.   -suspect a degree of obstruction from obstipation given rapid improvement after BM.   - NG d/ced 2/28>> clears  - 3 BM >>fulls 2/29 FEN: full liquids >> advance to soft ID: None DVT: Apixiban   Plan:  Advance to soft diet, continue to mobilize.  LOS: 30 days    Leah Mata 05/28/2018 [phone]

## 2018-05-28 NOTE — Discharge Instructions (Addendum)
Information on my medicine - ELIQUIS (apixaban)  This medication education was reviewed with me or my healthcare representative as part of my discharge preparation.  The pharmacist that spoke with me during my hospital stay was:  Sampson Si, [facility]  Why was Eliquis prescribed for you? Eliquis was prescribed for you to reduce the risk of a blood clot forming that can cause a stroke if you have a medical condition called atrial fibrillation (a type of irregular heartbeat).  What do You need to know about Eliquis ? Take your Eliquis TWICE DAILY - one tablet in the morning and one tablet in the evening with or without food. If you have difficulty swallowing the tablet whole please discuss with your pharmacist how to take the medication safely.  Take Eliquis exactly as prescribed by your doctor and DO NOT stop taking Eliquis without talking to the doctor who prescribed the medication.  Stopping may increase your risk of developing a stroke.  Refill your prescription before you run out.  After discharge, you should have regular check-up appointments with your healthcare provider that is prescribing your Eliquis.  In the future your dose may need to be changed if your kidney function or weight changes by a significant amount or as you get older.  What do you do if you miss a dose? If you miss a dose, take it as soon as you remember on the same day and resume taking twice daily.  Do not take more than one dose of ELIQUIS at the same time to make up a missed dose.  Important Safety Information A possible side effect of Eliquis is bleeding. You should call your healthcare provider right away if you experience any of the following: ? Bleeding from an injury or your nose that does not stop. ? Unusual colored urine (red or dark brown) or unusual colored stools (red or black). ? Unusual bruising for unknown reasons. ? A serious fall or if you hit your head (even if there is no  bleeding).  Some medicines may interact with Eliquis and might increase your risk of bleeding or clotting while on Eliquis. To help avoid this, consult your healthcare provider or pharmacist prior to using any new prescription or non-prescription medications, including herbals, vitamins, non-steroidal anti-inflammatory drugs (NSAIDs) and supplements.  This website has more information on Eliquis (apixaban): http://www.eliquis.com/eliquis/home    [facility]  Discharge Instructions   Open Aortic Surgery  Please refer to the following instructions for your post-procedure care. Your surgeon or Physician Assistant will discuss any changes with you.  Activity  Avoid lifting more than eight pounds (a gallon of milk) until after your first post-operative visit. You are encouraged to walk as much as you can. You can slowly return to normal activities but must avoid strenuous activity and heavy lifting until your doctor tells you it's okay. Heavy lifting can hurt the incision and cause a hernia. Avoid activities such as vacuuming or swinging a golf club. It is normal to feel tired for several weeks after your surgery. Do not drive until your doctor gives the okay and you are no longer taking prescription pain medications. It is also normal to have difficulty with sleep habits, eating and bowl movements after surgery. These will go away with time.  Bathing/Showering  Shower daily after you go home. Do not soak in a bathtub, hot tub, or swim until the incision heals.  Incision Care  Shower every day. Clean your incision with  mild soap and water. Pat the area dry with a clean towel. You do not need a bandage unless otherwise instructed. Do not apply any ointments or creams to your incision. You may have skin glue on your incision. Do not peel it off. It will come off on its own in about one week. If you have staples or sutures along your incision, they will be removed  at your post op appointment.  If you have groin incisions, wash the groin wounds with soap and water daily and pat dry. (No tub bath-only shower)  Then put a dry gauze or washcloth in the groin to keep this area dry to help prevent wound infection.  Do this daily and as needed.  Do not use Vaseline or neosporin on your incisions.  Only use soap and water on your incisions and then protect and keep dry.  Diet  Resume your normal diet. There are no special food restriction following this procedure. A low fat/low cholesterol diet is recommended for all patients with vascular disease. After your aortic surgery, it's normal to feel full faster than usual and to not feel as hungry as you normally would. You will probably lose weight initially following your surgery. It's best to eat small, frequent meals over the course of the day. Call the office if you find that you are unable to eat even small meals.   In order to heal from your surgery, it is CRITICAL to get adequate nutrition. Your body requires vitamins, minerals, and protein. Vegetables are the best source of vitamins and minerals. If you have pain, you may take over-the-counter pain reliever such as acetaminophen (Tylenol). If you were prescribed a stronger pain medication, please be aware these medication can cause nausea and constipation. Prevent nausea by taking the medication with a snack or meal. Avoid constipation by drinking plenty of fluids and eating foods with a high amount of fiber, such as fruits, vegetables and grains. Take 100mg  of the over-the-counter stool softener Colace twice a day as needed to help with constipation. A laxative, such as Milk of Magnesia, may be recommended for you at this time. Do not take a laxative unless your surgeon or P.A. tells you it's OK.  Do not take Tylenol if you are taking stronger pain medications (such as Percocet).  Follow Up  Our office will schedule a follow up appointment 2-3 weeks after  discharge.  Please call us immediately for any of the following conditions    .     Severe or worsening pain in your legs or feet or in your abdomen back or chest. Increased pain, redness drainage (pus) from your incision site. Increased abdominal pain, bloating, nausea, vomiting, or persistent diarrhea. Fever of 101 degrees or higher. Swelling in your leg (s).  Reduce your risk of vascular disease  Stop smoking. If you would like help, call [facility] at 1-800-QUIT-NOW ((213)083-4880) or [facility] at [phone]. Manage your cholesterol Maintain a desired weight Control your diabetes Keep your blood pressure down  If you have any questions please call the office at [phone].

## 2018-05-29 LAB — CBC
HCT: 30.1 % — ABNORMAL LOW (ref 36.0–46.0)
Hemoglobin: 9.7 g/dL — ABNORMAL LOW (ref 12.0–15.0)
MCH: 29.6 pg (ref 26.0–34.0)
MCHC: 32.2 g/dL (ref 30.0–36.0)
MCV: 91.8 fL (ref 80.0–100.0)
PLATELETS: 165 10*3/uL (ref 150–400)
RBC: 3.28 MIL/uL — ABNORMAL LOW (ref 3.87–5.11)
RDW: 16.1 % — AB (ref 11.5–15.5)
WBC: 7.6 10*3/uL (ref 4.0–10.5)
nRBC: 0 % (ref 0.0–0.2)

## 2018-05-29 LAB — RENAL FUNCTION PANEL
Albumin: 2.1 g/dL — ABNORMAL LOW (ref 3.5–5.0)
Anion gap: 10 (ref 5–15)
BUN: 11 mg/dL (ref 8–23)
CALCIUM: 7.3 mg/dL — AB (ref 8.9–10.3)
CO2: 26 mmol/L (ref 22–32)
Chloride: 96 mmol/L — ABNORMAL LOW (ref 98–111)
Creatinine, Ser: 1.14 mg/dL — ABNORMAL HIGH (ref 0.44–1.00)
GFR calc Af Amer: 56 mL/min — ABNORMAL LOW (ref >60)
GFR calc non Af Amer: 48 mL/min — ABNORMAL LOW (ref >60)
Glucose, Bld: 133 mg/dL — ABNORMAL HIGH (ref 70–99)
Phosphorus: 2.2 mg/dL — ABNORMAL LOW (ref 2.5–4.6)
Potassium: 3.8 mmol/L (ref 3.5–5.1)
SODIUM: 132 mmol/L — AB (ref 135–145)

## 2018-05-29 LAB — GLUCOSE, CAPILLARY
Glucose-Capillary: 100 mg/dL — ABNORMAL HIGH (ref 70–99)
Glucose-Capillary: 106 mg/dL — ABNORMAL HIGH (ref 70–99)
Glucose-Capillary: 94 mg/dL (ref 70–99)
Glucose-Capillary: 135 mg/dL — ABNORMAL HIGH (ref 70–99)

## 2018-05-29 MED ORDER — FUROSEMIDE 20 MG PO TABS
20.0000 mg | ORAL_TABLET | Freq: Every day | ORAL | Status: DC
  Administered 2018-05-30: 20 mg via ORAL
  Filled 2018-05-29: qty 1

## 2018-05-29 MED ORDER — METOPROLOL TARTRATE 50 MG PO TABS
50.0000 mg | ORAL_TABLET | Freq: Two times a day (BID) | ORAL | Status: DC
  Administered 2018-05-29 – 2018-05-30 (×3): 50 mg via ORAL
  Filled 2018-05-29 (×3): qty 1

## 2018-05-29 NOTE — Progress Notes (Signed)
Pt ambulated in hall approx. 500' with rollator and standby assist.  Room air.  Pt tolerated very well.  Has been sitting up in recliner since change of shift this morning and desired to go back to bed following walk.  Call bell within reach.

## 2018-05-29 NOTE — Progress Notes (Signed)
05/29/18 1513  PT Visit Information  Last PT Received On 05/29/18  Assistance Needed +1  History of Present Illness Pt is a 73 y.o. F with significant PMH of COPD who presents with abdominal and right flank pain. 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 S/p aneurysm abdominal aortic repair. Hospitalization complicated by worsening SOB requiring bipap on 2/4 due to pulmonary effusion, tachycardia 2/5 requiring amiodorone drip.  Subjective Data  Patient Stated Goal to get home  Precautions  Precautions Fall  Restrictions  Weight Bearing Restrictions No  Pain Assessment  Pain Assessment No/denies pain  Cognition  Arousal/Alertness Awake/alert  Behavior During Therapy WFL for tasks assessed/performed  Overall Cognitive Status Within Functional Limits for tasks assessed  Bed Mobility  General bed mobility comments received in chair  Transfers  Overall transfer level Needs assistance  Equipment used 4-wheeled walker  Transfers Sit to/from Stand  Sit to Stand Supervision  General transfer comment Patient required supervision to stand with use of rollator for safety. Educated about importance of making sure breaks are locked prior to standing/sitting.   Ambulation/Gait  Ambulation/Gait assistance Supervision  Gait Distance (Feet) 200 Feet  Assistive device 4-wheeled walker  Gait Pattern/deviations Step-through pattern;Decreased stride length  General Gait Details Patient ambulated with supervision and use of rollator for safety. Required verbal cues for sequencing with rollator.   Gait velocity decreased  Gait velocity interpretation <1.8 ft/sec, indicate of risk for recurrent falls  Stairs Yes  Stairs assistance Min guard  Stair Management One rail Right;Step to pattern;Backwards;Forwards  Number of Stairs 4  General stair comments Patient required min guard and use of 1 hand rail for stair navigation. No LOB noted.  Patient reports feeling comfortable completing stairs at home.   Balance  Overall balance assessment Needs assistance  Sitting-balance support No upper extremity supported;Feet supported  Sitting balance-[name] Good  Standing balance support Bilateral upper extremity supported;During functional activity  Standing balance-[name] Poor  Standing balance comment required use of rollator to maintain standing balance  Exercises  Exercises General Lower Extremity  General Exercises - Lower Extremity  Ankle Circles/Pumps AROM;Both;10 reps;Seated  Long Arc Quad AROM;Both;10 reps;Seated  Hip Flexion/Marching AROM;Both;10 reps;Seated  PT - End of Session  Equipment Utilized During Treatment Gait belt  Activity Tolerance Patient tolerated treatment well  Patient left in chair;with call bell/phone within reach;with nursing/sitter in room;with family/visitor present  Nurse Communication Mobility status   PT - Assessment/Plan  PT Plan Current plan remains appropriate  PT Visit Diagnosis Other abnormalities of gait and mobility (R26.89);Muscle weakness (generalized) (M62.81)  PT Frequency (ACUTE ONLY) Min 3X/week  Follow Up Recommendations Home health PT;Supervision/Assistance - 24 hour  PT equipment 3in1 (PT);Other (comment) (rollator)  AM-PAC PT "6 Clicks" Mobility Outcome Measure (Version 2)  Help needed turning from your back to your side while in a flat bed without using bedrails? 4  Help needed moving from lying on your back to sitting on the side of a flat bed without using bedrails? 4  Help needed moving to and from a bed to a chair (including a wheelchair)? 4  Help needed standing up from a chair using your arms (e.g., wheelchair or bedside chair)? 4  Help needed to walk in hospital room? 4  Help needed climbing 3-5 steps with a railing?  3  6 Click Score 23  Consider Recommendation of Discharge To: Home with no services  PT Goal Progression  Progress  towards PT goals  Progressing toward goals  Acute Rehab PT Goals  PT Goal Formulation With patient  Time For Goal Achievement 06/08/18  Potential to Achieve Goals Good  PT Time Calculation  PT Start Time (ACUTE ONLY) 1428  PT Stop Time (ACUTE ONLY) 1450  PT Time Calculation (min) (ACUTE ONLY) 22 min  PT General Charges  $$ ACUTE PT VISIT 1 Visit  PT Treatments  $Gait Training 8-22 mins   Patient progressing towards goals. Patient ambulated with supervision and use of rollator. Required min guard for stair navigation. No LOB noted. Educated and reviewed seated HEP and handout was provided (Access Code: [email or web address]). Current plan remains appropriate. Patient will continue to benefit from acute physical therapy to maximize independence and safety with functional mobility.   Vanessa Ralphs, SPT

## 2018-05-29 NOTE — Care Management Note (Signed)
Case Management Note Donn Pierini RN, BSN Transitions of Care Unit 4E- RN Case Manager [phone]  Patient Details  Name: Leah Mata MRN: [id number] Date of Birth: [birth year]  Subjective/Objective:   Pt admitted s/p AAA repair with extended stay due to post op complications of post op ileus and vol. overload                 Action/Plan: PTA pt lived at home with son/grandson. CIR was consulted and initially following for admission- however pt has progressed past needing intensive IP rehab, and now PT/OT are recommending HH therapies with 24/7 initial supervision. CM spoke with pt and DIL at bedside, pt reports that son works and would not be able to provide 24/7 assistance. DIL has offered pt to come stay with her initially. Discussed options of Home with HH vs STSNF. Also discussed DME potential needs including rollator, 3n1, ?hospital bed and home 02.  Pt and family to discuss options. List provided to pt Per CMS guidelines from medicare.gov website with star ratings (copy placed in shadow chart) for HH choice- CM to f/u with pt and family in am for decision on transition plan home vs STSNF.   Expected Discharge Date:                  Expected Discharge Plan:  Home w Home Health Services  In-House Referral:  Clinical Social Work  Discharge planning Services  CM Consult  Post Acute Care Choice:  Durable Medical Equipment, Home Health Choice offered to:  Patient  DME Arranged:  3-N-1, Walker rolling with seat DME Agency:  [facility]  HH Arranged:  PT, RN [facility] Agency:  [facility]  Status of Service:  In process, will continue to follow  If discussed at Long Length of Stay Meetings, dates discussed:    Discharge Disposition: home/home health   Additional Comments:  05/29/18- 1130- Donn Pierini RN, CM- f/u done with pt and son at bedside. Per pt she has chosen to go home and stay with her DIL for a short while prior to returning home. Reviewed list  for [facility] choice- HHPT order has been placed per pt she would like to use [facility] for [facility] services- call made to Lupita Leash with [facility] (now known as [facility])- for referral- notified referral has been accepted. Pt will need orders for DME- rollator and 3n1 which will be arranged for delivery to room prior to discharge. Pt will d/c home on Eliquis- CM reviewed coverage info with pt and if pt goes to her outside pharmacy CM will provide 30 day free card to use for first month. CM will f/u for transition of care needs- and get DIL address 3/3  05/24/18- 1150- Kharlie Bring RN, CM- noted pt with recurrent ileus vs SBO- and NGT re-inserted. CM did speak with pt and son at bedside and will continue to follow for transition of care needs as pt progresses.    Darrold Span, RN 05/29/2018, 12:04 PM

## 2018-05-29 NOTE — Progress Notes (Addendum)
  Progress Note    05/29/2018 8:18 AM 31 Days Post-Op  Subjective:  No new complaints.  Bowel movement this morning   Vitals:   05/29/18 0400 05/29/18 0553  BP: (!) 141/64   Pulse: 75   Resp: (!) 22 (!) 26  Temp:    SpO2: 92%    Physical Exam: Lungs:  Non labored Incisions:  Abd/groin incisions healing well Extremities:  Palpable DP pulses Abdomen:  Soft, NT, ND Neurologic: A&O  CBC    Component Value Date/Time   WBC 7.6 05/29/2018 0304   RBC 3.28 (L) 05/29/2018 0304   HGB 9.7 (L) 05/29/2018 0304   HCT 30.1 (L) 05/29/2018 0304   PLT 165 05/29/2018 0304   MCV 91.8 05/29/2018 0304   MCH 29.6 05/29/2018 0304   MCHC 32.2 05/29/2018 0304   RDW 16.1 (H) 05/29/2018 0304   LYMPHSABS 0.8 05/15/2018 0657   MONOABS 2.0 (H) 05/15/2018 0657   EOSABS 0.1 05/15/2018 0657   BASOSABS 0.1 05/15/2018 0657    BMET    Component Value Date/Time   NA 132 (L) 05/29/2018 0304   K 3.8 05/29/2018 0304   CL 96 (L) 05/29/2018 0304   CO2 26 05/29/2018 0304   GLUCOSE 133 (H) 05/29/2018 0304   BUN 11 05/29/2018 0304   CREATININE 1.14 (H) 05/29/2018 0304   CALCIUM 7.3 (L) 05/29/2018 0304   GFRNONAA 48 (L) 05/29/2018 0304   GFRAA 56 (L) 05/29/2018 0304    INR    Component Value Date/Time   INR 1.16 04/30/2018 0413     Intake/Output Summary (Last 24 hours) at 05/29/2018 0818 Last data filed at 05/29/2018 0554 Gross per 24 hour  Intake 1141.93 ml  Output 2675 ml  Net -1533.07 ml     Assessment/Plan:  73 y.o. female is s/p open repair of ruptured abdominal aortic aneurysm.  She is having bowel function tolerating diet at this point 31 Days Post-Op   Incisions healing Perfusing BLE BM this morning; advance to regular diet P.o. metoprolol and Eliquis per Cardiology Renal function at baseline, euvolemic; can likely discontinue IV lasix at this point Case manager to arrange [facility] PT Possible d/c this afternoon/tomorrow   Leah Rutter, Leah Mata-C [facility] [phone] 05/29/2018 8:18 AM   I have independently interviewed and examined patient and agree with Leah Mata assessment and plan above.    C. Randie Heinz, MD [facility] [place] Office: [phone] Pager: [phone]

## 2018-05-29 NOTE — Progress Notes (Signed)
Pt ambulated approx. 500' in hall with rollator accompanied by family member.  Up to recliner, call bell within reach.

## 2018-05-29 NOTE — Progress Notes (Signed)
Occupational Therapy Treatment Patient Details Name: Leah Mata MRN: [id number] DOB: January 06, 1946 Today's Date: 05/29/2018    History of present illness Pt is a 73 y.o. F with significant PMH of COPD who presents with abdominal and right flank pain. 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 S/p aneurysm abdominal aortic repair. Hospitalization complicated by worsening SOB requiring bipap on 2/4 due to pulmonary effusion, tachycardia 2/5 requiring amiodorone drip.   OT comments  Pt progressing towards established OT goals. Providing pt with education on safe tub transfer both with and without rollator. Pt performing toileting, hand hygiene at sink, and mobility with supervision-Min Guard A for safety. Pt participating in BLE exercises for edema management. Continue to recommend dc home. Will continue to follow acutely as admitted.   Follow Up Recommendations  Home health OT    Equipment Recommendations  Tub/shower seat;3 in 1 bedside commode    Recommendations for Other Services PT consult    Precautions / Restrictions Precautions Precautions: Fall Restrictions Weight Bearing Restrictions: No       Mobility Bed Mobility               General bed mobility comments: received in chair  Transfers Overall transfer level: Needs assistance Equipment used: None Transfers: Sit to/from Stand Sit to Stand: Supervision         General transfer comment: Supervision for safety    Balance Overall balance assessment: Needs assistance Sitting-balance support: No upper extremity supported;Feet supported Sitting balance-[name]: Good     Standing balance support: Bilateral upper extremity supported;During functional activity Standing balance-[name]: Poor Standing balance comment: required use of rollator to maintain standing balance                           ADL either performed or assessed  with clinical judgement   ADL Overall ADL's : Needs assistance/impaired     Grooming: Wash/dry hands;Standing;Min guard Grooming Details (indicate cue type and reason): Min Guard A for Administrator, arts Details (indicate cue type and reason): Educating pt on use of 3N1 as a toilet riser.        Tub/Shower Transfer Details (indicate cue type and reason): Providing pt with education on safe tub transfer techniques. Noth with and without DME Functional mobility during ADLs: Min guard General ADL Comments: Providing pt with education on use of 3N1 for toilet riser and shower seat. Pt requiring Supervision-Min Guard A for safety overall. Reviewed LB dressing techniques.     Vision       Perception     Praxis      Cognition Arousal/Alertness: Awake/alert Behavior During Therapy: WFL for tasks assessed/performed Overall Cognitive Status: Within Functional Limits for tasks assessed                                          Exercises Exercises: General Lower Extremity General Exercises - Lower Extremity Ankle Circles/Pumps: AROM;Both;10 reps;Seated Long Arc Quad: AROM;Both;10 reps;Seated Hip Flexion/Marching: AROM;Both;10 reps;Seated   Shoulder Instructions       General Comments Friend present throughout session    Pertinent Vitals/ Pain       Pain Assessment: No/denies pain  Home Living  Prior Functioning/Environment              Frequency  Min 2X/week        Progress Toward Goals  OT Goals(current goals can now be found in the care plan section)  Progress towards OT goals: Progressing toward goals  Acute Rehab OT Goals Patient Stated Goal: to get home OT Goal Formulation: With patient Time For Goal Achievement: 05/31/18 Potential to Achieve Goals: Good ADL Goals Pt Will Perform Upper Body Dressing: with modified independence;sitting Pt Will  Perform Lower Body Dressing: with modified independence;sit to/from stand;with adaptive equipment Pt Will Transfer to Toilet: with modified independence;ambulating;bedside commode Pt Will Perform Toileting - Clothing Manipulation and hygiene: with modified independence;sit to/from stand;sitting/lateral leans Pt Will Perform Tub/Shower Transfer: Tub transfer;shower seat;ambulating;rolling walker;with min guard assist Additional ADL Goal #1: Pt will be able to perform bed mobility with supervision in preparation for ADLs  Plan Discharge plan remains appropriate    Co-evaluation                 AM-PAC OT "6 Clicks" Daily Activity     Outcome Measure   Help from another person eating meals?: None Help from another person taking care of personal grooming?: A Little Help from another person toileting, which includes using toliet, bedpan, or urinal?: A Little Help from another person bathing (including washing, rinsing, drying)?: A Little Help from another person to put on and taking off regular upper body clothing?: None Help from another person to put on and taking off regular lower body clothing?: A Little 6 Click Score: 20    End of Session    OT Visit Diagnosis: Unsteadiness on feet (R26.81);Other abnormalities of gait and mobility (R26.89);Muscle weakness (generalized) (M62.81);Pain Pain - part of body: (Abdoman)   Activity Tolerance Patient tolerated treatment well   Patient Left with call bell/phone within reach;in chair   Nurse Communication Mobility status        Time: 1638-4665 OT Time Calculation (min): 23 min  Charges: OT General Charges $OT Visit: 1 Visit OT Treatments $Self Care/Home Management : 23-37 mins  Dashaun Onstott MSOT, OTR/L Acute Rehab Pager: [phone] Office: [phone]   Theodoro Grist Belita Warsame 05/29/2018, 5:11 PM

## 2018-05-29 NOTE — Progress Notes (Signed)
Patient ID: Leah Mata, female   DOB: 1945/12/29, 73 y.o.   MRN: [id number]    31 Days Post-Op  Subjective: Patient with no complaints this am.  Tolerating soft diet and still having BMs.  Denies abdominal pain.  Objective: Vital signs in last 24 hours: Temp:  [97.8 F (36.6 C)-98.8 F (37.1 C)] 98.5 F (36.9 C) (03/02 0000) Pulse Rate:  [70-92] 75 (03/02 0400) Resp:  [20-26] 26 (03/02 0553) BP: (109-141)/(49-64) 141/64 (03/02 0400) SpO2:  [92 %-96 %] 92 % (03/02 0400) Weight:  [78.4 kg] 78.4 kg (03/02 0553) Last BM Date: 05/28/18  Intake/Output from previous day: 03/01 0701 - 03/02 0700 In: 1141.9 [P.O.:700; I.V.:441.9] Out: 2675 [Urine:2675] Intake/Output this shift: No intake/output data recorded.  PE: Abd: soft, NT, ND, +BS  Lab Results:  Recent Labs    05/28/18 0302 05/29/18 0304  WBC 6.4 7.6  HGB 9.4* 9.7*  HCT 29.3* 30.1*  PLT 158 165   BMET Recent Labs    05/28/18 0302 05/29/18 0304  NA 136 132*  K 3.7 3.8  CL 98 96*  CO2 31 26  GLUCOSE 96 133*  BUN 13 11  CREATININE 1.10* 1.14*  CALCIUM 7.5* 7.3*   PT/INR No results for input(s): LABPROT, INR in the last 72 hours. CMP     Component Value Date/Time   NA 132 (L) 05/29/2018 0304   K 3.8 05/29/2018 0304   CL 96 (L) 05/29/2018 0304   CO2 26 05/29/2018 0304   GLUCOSE 133 (H) 05/29/2018 0304   BUN 11 05/29/2018 0304   CREATININE 1.14 (H) 05/29/2018 0304   CALCIUM 7.3 (L) 05/29/2018 0304   PROT 4.8 (L) 05/20/2018 0539   ALBUMIN 2.1 (L) 05/29/2018 0304   AST 27 05/20/2018 0539   ALT 31 05/20/2018 0539   ALKPHOS 100 05/20/2018 0539   BILITOT 1.1 05/20/2018 0539   GFRNONAA 48 (L) 05/29/2018 0304   GFRAA 56 (L) 05/29/2018 0304   Lipase  No results found for: LIPASE     Studies/Results: No results found.  Anti-infectives: Anti-infectives (From admission, onward)   Start     Dose/Rate Route Frequency Ordered Stop   05/03/18 1815  cefTRIAXone (ROCEPHIN) 1 g in sodium chloride 0.9 %  100 mL IVPB  Status:  Discontinued     1 g 200 mL/hr over 30 Minutes Intravenous Every 24 hours 05/03/18 1728 05/09/18 1148   04/29/18 0400  ceFAZolin (ANCEF) IVPB 2g/100 mL premix     2 g 200 mL/hr over 30 Minutes Intravenous Every 8 hours 04/29/18 0044 04/29/18 1259       Assessment/Plan Current smoker COPD HTN HLD Paroxysmal atrial fibrillation- to start Eliquis today AKI  Ruptured abdominal aortic aneurysm with open repair,04/29/2019, Dr.Todd Early  Postop ileus versus SBO -tolerating soft diet with no issues -still having flatus and BMs -no further surgical interventions/treatments planned.  We will sign off -further care per vascular  FEN: soft diet ID: None [id number]   LOS: 31 days    Letha Cape , [facility] 05/29/2018, 7:39 AM Pager: [phone]

## 2018-05-29 NOTE — Care Management Important Message (Signed)
Important Message  Patient Details  Name: Leah Mata MRN: [id number] Date of Birth: 1945/08/16   Medicare Important Message Given:  Yes    Elwyn Lowden P Rosella Crandell 05/29/2018, 1:41 PM

## 2018-05-30 ENCOUNTER — Telehealth: Payer: Self-pay | Admitting: *Deleted

## 2018-05-30 LAB — RENAL FUNCTION PANEL
ALBUMIN: 2.1 g/dL — AB (ref 3.5–5.0)
Anion gap: 7 (ref 5–15)
BUN: 10 mg/dL (ref 8–23)
CO2: 26 mmol/L (ref 22–32)
Calcium: 7.6 mg/dL — ABNORMAL LOW (ref 8.9–10.3)
Chloride: 99 mmol/L (ref 98–111)
Creatinine, Ser: 1.18 mg/dL — ABNORMAL HIGH (ref 0.44–1.00)
GFR calc Af Amer: 54 mL/min — ABNORMAL LOW (ref >60)
GFR calc non Af Amer: 46 mL/min — ABNORMAL LOW (ref >60)
Glucose, Bld: 105 mg/dL — ABNORMAL HIGH (ref 70–99)
PHOSPHORUS: 2.7 mg/dL (ref 2.5–4.6)
Potassium: 4.1 mmol/L (ref 3.5–5.1)
SODIUM: 132 mmol/L — AB (ref 135–145)

## 2018-05-30 LAB — GLUCOSE, CAPILLARY
Glucose-Capillary: 93 mg/dL (ref 70–99)
Glucose-Capillary: 111 mg/dL — ABNORMAL HIGH (ref 70–99)

## 2018-05-30 MED ORDER — COMPRESSOR/NEBULIZER MISC: 0 refills | Status: DC

## 2018-05-30 MED ORDER — POTASSIUM CHLORIDE ER 10 MEQ PO TBCR: 20.0000 meq | EXTENDED_RELEASE_TABLET | Freq: Every day | ORAL | 0 refills | Status: DC

## 2018-05-30 MED ORDER — LEVALBUTEROL HCL 1.25 MG/0.5ML IN NEBU: 1.2500 mg | INHALATION_SOLUTION | Freq: Four times a day (QID) | RESPIRATORY_TRACT | 1 refills | Status: DC | PRN

## 2018-05-30 MED ORDER — FUROSEMIDE 20 MG PO TABS: 20.0000 mg | ORAL_TABLET | Freq: Every day | ORAL | 0 refills | Status: DC

## 2018-05-30 MED ORDER — APIXABAN 5 MG PO TABS: 5.0000 mg | ORAL_TABLET | Freq: Two times a day (BID) | ORAL | 2 refills | Status: DC

## 2018-05-30 MED ORDER — METOPROLOL TARTRATE 50 MG PO TABS: 50.0000 mg | ORAL_TABLET | Freq: Two times a day (BID) | ORAL | 2 refills | Status: DC

## 2018-05-30 MED ORDER — OXYCODONE-ACETAMINOPHEN 5-325 MG PO TABS: 1.0000 | ORAL_TABLET | Freq: Four times a day (QID) | ORAL | 0 refills | Status: DC | PRN

## 2018-05-30 MED FILL — ELIQUIS 5 MG TABLET: 5 | 30 days supply | Qty: 60 | Fill #0 | Status: TO

## 2018-05-30 MED FILL — LEVALBUTEROL HCL 1.25 MG/3M: 1.25 | 8 days supply | Qty: 75 | Fill #0 | Status: TO

## 2018-05-30 MED FILL — FUROSEMIDE 20 MG TAB: 20 | 30 days supply | Qty: 30 | Fill #0

## 2018-05-30 MED FILL — METOPROLOL TARTRATE 50 MG T: 50 | 30 days supply | Qty: 60 | Fill #0 | Status: TO

## 2018-05-30 MED FILL — POTASSIUM CHL ER M10 TABLET: 10 | 30 days supply | Qty: 60 | Fill #0

## 2018-05-30 MED FILL — OXYCODONE W/APAP 5/325 TAB: 5-325 | 5 days supply | Qty: 20 | Fill #0

## 2018-05-30 NOTE — Care Management Note (Signed)
Case Management Note Donn Pierini RN, BSN Transitions of Care Unit 4E- RN Case Manager [phone]  Patient Details  Name: Leah Mata MRN: [id number] Date of Birth: 03-Dec-1945  Subjective/Objective:   Pt admitted s/p AAA repair with extended stay due to post op complications of post op ileus and vol. overload                 Action/Plan: PTA pt lived at home with son/grandson. CIR was consulted and initially following for admission- however pt has progressed past needing intensive IP rehab, and now PT/OT are recommending HH therapies with 24/7 initial supervision. CM spoke with pt and DIL at bedside, pt reports that son works and would not be able to provide 24/7 assistance. DIL has offered pt to come stay with her initially. Discussed options of Home with HH vs STSNF. Also discussed DME potential needs including rollator, 3n1, ?hospital bed and home 02.  Pt and family to discuss options. List provided to pt Per CMS guidelines from medicare.gov website with star ratings (copy placed in shadow chart) for HH choice- CM to f/u with pt and family in am for decision on transition plan home vs STSNF.   Expected Discharge Date:  05/30/18               Expected Discharge Plan:  Home w Home Health Services  In-House Referral:  Clinical Social Work  Discharge planning Services  CM Consult  Post Acute Care Choice:  Durable Medical Equipment, Home Health Choice offered to:  Patient  DME Arranged:  3-N-1, Walker rolling with seat, Nebulizer machine, Hospital bed DME Agency:  [facility]  HH Arranged:  PT, RN HH Agency:  [facility]  Status of Service:  Completed, signed off  If discussed at Long Length of Stay Meetings, dates discussed:    Discharge Disposition: home/home health   Additional Comments:  05/30/18- 1030- Vin Yonke RN, CM- pt for transition home today, order for [facility] added to orders, also DME orders placed for 3n1, rollator, nebulizer, and hospital  bed. Call made to Benito Mccreedy with [facility] for DME needs- rollator and 3n1 have been delivered to room, hospital bed and nebulizer to be delivered to home. Pt to be at [facility] house- address- [address], [place] [address]. Phone: [phone], [facility] pharmacy to fill and deliver meds to bedside prior to discharge.   05/29/18- 1130- Donn Pierini RN, CM- f/u done with pt and son at bedside. Per pt she has chosen to go home and stay with her DIL for a short while prior to returning home. Reviewed list for [facility] choice- HHPT order has been placed per pt she would like to use [facility] for [facility] services- call made to Lupita Leash with [facility] ([facility] known [facility])- for referral- notified referral has been accepted. Pt will need orders for DME- rollator and 3n1 which will be arranged for delivery to room prior to discharge. Pt will d/c home on Eliquis- CM reviewed coverage info with pt and if pt goes to her outside pharmacy CM will provide 30 day free card to use for first month. CM will f/u for transition of care needs- and get DIL address 3/3  05/24/18- 1150- Momodou Consiglio RN, CM- noted pt with recurrent ileus vs SBO- and NGT re-inserted. CM did speak with pt and son at bedside and will continue to follow for transition of care needs as pt progresses.    Darrold Span, RN 05/30/2018,  11:48 AM

## 2018-05-30 NOTE — Progress Notes (Signed)
Patient going to DIL home post transition,  Address is:[address] [place] [address], phone: [phone]

## 2018-05-30 NOTE — Progress Notes (Signed)
Pt s/p AAA repair with long hospital stay (32 days) due to post op complications of volume overload and post op ileus.        Durable Medical Equipment  (From admission, onward)         Start     Ordered   05/30/18 0958  For home use only DME Nebulizer machine  Once    Question:  Patient needs a nebulizer to treat with the following condition  Answer:  COPD (chronic obstructive pulmonary disease) (HCC)   05/30/18 0959   05/30/18 0958  For home use only DME Hospital bed  Once    Question Answer Comment  Patient has (list medical condition): s/p repair rupture AAA   The above medical condition requires: Patient requires the ability to reposition frequently   Head must be elevated greater than: Other see comments   Bed type Semi-electric   Support Surface: Gel Overlay      05/30/18 0959   05/30/18 0739  For home use only DME 3 n 1  Once     05/30/18 0740   05/30/18 0739  For home use only DME Walker rolling  Once    Comments:  Needs rolator  Question:  Patient needs a walker to treat with the following condition  Answer:  S/P AAA (abdominal aortic aneurysm) repair   05/30/18 0740

## 2018-05-30 NOTE — Discharge Summary (Signed)
AAA Discharge Summary    Leah Mata 1946-03-17 73 y.o. female  [id number]  Admission Date: 04/28/2018  Discharge Date: 05/30/2018  Physician: Rosetta Posner, MD  Admission Diagnosis: Ruptured abdominal aortic aneurysm (AAA) [facility]) [I71.3]   HPI:   This is a 73 y.o. female patient presented to [facility] with abdominal and right flank pain.  CT scan showed large greater than 8 cm infrarenal aneurysm with contained rupture in the right retroperitoneal space.  She was directed to come immediately to [facility] to the operating room.  I have met her at the operating room and she was taken immediately to the room.  She was conscious at the time.  History was obtained from the chart.  She is relatively healthy with allergy only to hydrochlorothiazide.  She did have palpable femoral pulses but no palpable pedal pulses.  Explained the need for emergent surgery and the patient understood.  Hospital Course:  The patient was admitted to the hospital and taken to the operating room on 04/28/2018 - 04/29/2018 and underwent: Open repair of ruptured infrarenal abdominal aortic aneurysm    The pt tolerated the procedure well and was transported to the PACU in good condition.   By POD 1, the pt was following commands on the vent.  ABG's look excellent.  Pt was extubated later that day.     POD 2, NGT and A-line removed.  Foley remained in place.  Gentle diuresis.   POD 3, pulmonary toilet and ambulation.   POD 4, GI function returning.  Foley removed.  Clear liquids started and transferred to stepdown.  Ambulation.  Later that day, pt had tachycardia and HTN with sob.  Her transfer was cancelled.  She was started on Norvasc and IV lasix and prn hydralazine.  Foley was replaced.   POD 5, pt had increased work of breathing with sats around 90% on 2LO2NC.  Required BiPap overnight and tolerated. GI function continued to return and pulmonary remained primary concern.  She did have some sinus  tach with runs that appear to be afib.   She did have an echo that day with the following results: IMPRESSION:  1. The left ventricle has hyperdynamic systolic function of >10%. The cavity size is normal. There is no increased left ventricular wall thickness. Echo evidence of impaired diastolic relaxation.  2. The right ventricle has normal systolic function. The cavity in normal in size. There is no increase in right ventricular wall thickness.  3. The mitral valve is normal in structure.  4. The aortic valve has an indeterminant number of cusps.  5. The pulmonic valve is Not well seen. Pulmonic valve regurgitation was not assessed by color flow Doppler.  6. There is mild dilatation of the aortic root.  7. The interatrial septum was not assessed.  8. Extremely limited due to poor sound wave transmission; definity used; vigorous LV systolic function; mild diastolic dysfunction.  Bedside u/s with no significant effusion on the right.  Appears to be consolidated lung and ceftriaxone was started for presumed aspiration.   Amiodarone was started for afib.    POD 6, thrombocytopenia resolved.  Creatinine slightly elevated.    POD 7, stable and back in NSR.    POD 8, pt abdomen distended and episode of bilious emesis and suspect ileus but continues to pass flatus.  Continues to have palpable pedal pulses.  Sinus rhythm on amio gtt.  Gentle diuresis.  Cardiology consult obtained.  Continue IV BB.  KUB revealed  dilated small bowel and large bowel loops consistent with ileus although obstruction could not be ruled out.  Creatinine 2.0.  K+ down to 5 from 6.2.    POD 9, 1100cc NGT/24hr bilious.  Abdomen distended but no pain.  IVF increased to 125cc/hr.  Continue npo.   POD 10, falsely elevated K+.  K+ 3.6.  Discussed with pt that if she has bowel obstruction, this could require surgery.  Continue mobilization.  Pt had CT abdomen that did show a focal transition point in small bowel suggesting  obstruction however it is felt that this could still be related to postop ileus. Creatinine has plateaued and hopefully should start trending down soon.  POD 11, KUB revelaed continued distention of small bowel but also air in the colon.  Replace NGT.  Rocephin d/c'd for pna.  Pt was transferred to stepdown.  POD 12, creatine continues to improve.  Still with PAF.  Persistent but stable leukocytosis.  800cc/24 hr NGT.  General surgery consult obtained.  SBO protocol started as well as TPN.   POD 13, 4500cc/24hr out of NGT.  Abdomen soft but distended and non tender.  +flatus.  Still PAF.  Continue bowel rest as dilated loops on plain film persist.  BP poorly controlled.  Hydralazine scheduled and metoprolol switched to labetalol and IV lasix started for fluid overload.   POD 14, pt had large BM.  Pt had increased NGT output 1st shift but decreased 2nd shift.  Still distended-continue TPN and NGT. Pt with increased leukocytosis-u/a unremarkable and pt is afebrile.    POD 15, NGT clamped.    POD 16, did well with clamping and diet advanced to clear liquids.   POD 17, no nausea.  NGT removed and diet advanced.  Per cards, will not start oral AC unless recurrence of afib.    POD 18, doing well.  Advancing diet as tolerated.  Skin staples removed.   POD 19, tolerating diet.  +BM.  Creatinine up to 1.6.  Continues to be fluid overloaded and receiving IV lasix.  Leukocytosis improving.    POD 20, creatinine slightly elevated again at 1.8.som serous drainage from inferior pole of incision but no evidence of dehiscence.  IV antihypertensives converted to po.  Lisinopril not restarted.   POD 21, pt felt anxious and received Xanax, which helped.  Bowels functioning and pt tolerating diet.  Creatinine 1.9 most likely related to diuresis. Continue to monitor.  Renal consult obtained and lasix increased to '120mg'$  IV tid and 1.2L fluid restriction.    POD 22, creatinine stable.  Pt with good uop.  POD 24,  creatinine down to 1.56 with good uop.  Anasarca improving   Pt went into aflutter and pt started on Eliquis.   Protein for wound healing.  POD 25, pt deconditioned but too good for CIR.    POD 26, pt having increased pain and vomiting.  NGT was placed and this improved.  General surgery re-consulted.  Continue npo.  Pt Eliquis dc'd and started on heparin gtt.    POD 27, pt had large BM.  Abdomen softer today.  Xray essentially normal and abdomen is benign on exam.  Transient recurrent sbo or ileus improving quickly.  Advance slowly.   Creatinine stable around 1.4  NGT clamped.  POD 28, NGT clamped and doing well with this.  Off oxygen this am and sats in mid 90's.   POD 29, diet advanced to full liquids.  AC converted back to Eliquis.  POD 30, +BM prior day and tolerating liquids.  Diet advanced.    POD 31, pt doing well.  IV lasix discontinued.    POD 32, pt doing well.  sbo vs ileus resolved. Pt is discharged home.   The remainder of the hospital course consisted of increasing mobilization and increasing intake of solids without difficulty.  CBC    Component Value Date/Time   WBC 7.6 05/29/2018 0304   RBC 3.28 (L) 05/29/2018 0304   HGB 9.7 (L) 05/29/2018 0304   HCT 30.1 (L) 05/29/2018 0304   PLT 165 05/29/2018 0304   MCV 91.8 05/29/2018 0304   MCH 29.6 05/29/2018 0304   MCHC 32.2 05/29/2018 0304   RDW 16.1 (H) 05/29/2018 0304   LYMPHSABS 0.8 05/15/2018 0657   MONOABS 2.0 (H) 05/15/2018 0657   EOSABS 0.1 05/15/2018 0657   BASOSABS 0.1 05/15/2018 0657    BMET    Component Value Date/Time   NA 132 (L) 05/30/2018 0258   K 4.1 05/30/2018 0258   CL 99 05/30/2018 0258   CO2 26 05/30/2018 0258   GLUCOSE 105 (H) 05/30/2018 0258   BUN 10 05/30/2018 0258   CREATININE 1.18 (H) 05/30/2018 0258   CALCIUM 7.6 (L) 05/30/2018 0258   GFRNONAA 46 (L) 05/30/2018 0258   GFRAA 54 (L) 05/30/2018 0258     Discharge Instructions    Discharge patient   Complete by:  As directed     Discharge disposition:  01-Home or Self Care   Discharge patient date:  05/30/2018      Discharge Diagnosis:  Ruptured abdominal aortic aneurysm (AAA) ([place]) [I71.3]  Secondary Diagnosis: Patient Active Problem List   Diagnosis Date Noted  . Pressure injury of skin 05/23/2018  . Hypoxemia   . Pulmonary infiltrates   . Atrial fibrillation ([place])   . Acute respiratory failure ([place])   . Ileus ([place])   . AAA (abdominal aortic aneurysm) ([place]) 04/29/2018  . AAA (abdominal aortic aneurysm) without rupture ([place]) 04/28/2018  . Acute upper respiratory infection 04/06/2018  . Left arm pain 11/17/2017  . Skin lesion of face 09/07/2016  . Allergic rhinitis 09/07/2016  . Microhematuria 03/03/2016  . Hypercalcemia 08/12/2015  . Cough 12/19/2014  . Bilateral hearing loss 12/19/2014  . Hyperlipidemia 08/22/2013  . Smoker 12/01/2012  . COPD exacerbation ([place]) 02/24/2011  . 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 Pre-diabetes 08/19/2010  . Preventative health care 08/19/2010  . INSOMNIA-SLEEP DISORDER-UNSPEC 06/05/2009  . BACK PAIN 03/14/2009  . Essential hypertension 11/01/2006  . COPD (chronic obstructive pulmonary disease) ([place]) 11/01/2006   Past Medical History:  Diagnosis Date  . BACK PAIN 03/14/2009  . COPD 11/01/2006  . GLUCOSE INTOLERANCE 03/14/2009  . Headache(784.0) 03/14/2009  . HYPERTENSION 11/01/2006  . 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 INSOMNIA, HX OF 01/06/2007  . Other and unspecified hyperlipidemia 08/22/2013  . Urinary frequency 03/14/2009     Allergies as of 05/30/2018      Reactions   Hydrochlorothiazide       Medication List    STOP taking these medications   amLODipine 5 MG tablet Commonly known as:  NORVASC   doxycycline 100 MG tablet Commonly known as:  VIBRA-TABS   lisinopril 20 MG tablet Commonly known as:  PRINIVIL,ZESTRIL   predniSONE 10 MG tablet Commonly known as:  DELTASONE   silver sulfADIAZINE 1 % cream Commonly known as:  SILVADENE     temazepam 15 MG capsule Commonly known as:  RESTORIL     TAKE these medications  albuterol 108 (90 Base) MCG/ACT inhaler Commonly known as:  VENTOLIN HFA Inhale 2 puffs into the lungs every 6 (six) hours as needed for wheezing or shortness of breath.   apixaban 5 MG Tabs tablet Commonly known as:  ELIQUIS Take 1 tablet (5 mg total) by mouth 2 (two) times daily.   Compressor/Nebulizer Misc Refer to nebulizer dosing   fluticasone 50 MCG/ACT nasal spray Commonly known as:  FLONASE Place 2 sprays into both nostrils daily.   furosemide 20 MG tablet Commonly known as:  LASIX Take 1 tablet (20 mg total) by mouth daily.   levalbuterol 1.25 MG/0.5ML nebulizer solution Commonly known as:  XOPENEX Take 1.25 mg by nebulization every 6 (six) hours as needed for wheezing or shortness of breath.   metoprolol tartrate 50 MG tablet Commonly known as:  LOPRESSOR Take 1 tablet (50 mg total) by mouth 2 (two) times daily.   oxyCODONE-acetaminophen 5-325 MG tablet Commonly known as:  PERCOCET/ROXICET Take 1 tablet by mouth every 6 (six) hours as needed for severe pain.   potassium chloride 10 MEQ tablet Commonly known as:  K-DUR Take 2 tablets (20 mEq total) by mouth daily.   pravastatin 20 MG tablet Commonly known as:  PRAVACHOL Take 1 tablet (20 mg total) by mouth daily.            Durable Medical Equipment  (From admission, onward)         Start     Ordered   05/30/18 0933  For home use only DME Hospital bed  Once    Question:  Bed type  Answer:  Semi-electric   05/30/18 0932   05/30/18 0739  For home use only DME 3 n 1  Once     05/30/18 0740   05/30/18 0739  For home use only DME Walker rolling  Once    Comments:  Needs rolator  Question:  Patient needs a walker to treat with the following condition  Answer:  S/P AAA (abdominal aortic aneurysm) repair   05/30/18 0740          Instructions:  [facility] Discharge  Instructions   Open Aortic Surgery  Please refer to the following instructions for your post-procedure care. Your surgeon or Physician Assistant will discuss any changes with you.  Activity  Avoid lifting more than eight pounds (a gallon of milk) until after your first post-operative visit. You are encouraged to walk as much as you can. You can slowly return to normal activities but must avoid strenuous activity and heavy lifting until your doctor tells you it's okay. Heavy lifting can hurt the incision and cause a hernia. Avoid activities such as vacuuming or swinging a golf club. It is normal to feel tired for several weeks after your surgery. Do not drive until your doctor gives the okay and you are no longer taking prescription pain medications. It is also normal to have difficulty with sleep habits, eating and bowl movements after surgery. These will go away with time.  Bathing/Showering  Shower daily after you go home. Do not soak in a bathtub, hot tub, or swim until the incision heals.  Incision Care  Shower every day. Clean your incision with mild soap and water. Pat the area dry with a clean towel. You do not need a bandage unless otherwise instructed. Do not apply any ointments or creams to your incision. You may have skin glue on your incision. Do not peel it off. It will come  off on its own in about one week. If you have staples or sutures along your incision, they will be removed at your post op appointment.  If you have groin incisions, wash the groin wounds with soap and water daily and pat dry. (No tub bath-only shower)  Then put a dry gauze or washcloth in the groin to keep this area dry to help prevent wound infection.  Do this daily and as needed.  Do not use Vaseline or neosporin on your incisions.  Only use soap and water on your incisions and then protect and keep dry.  Diet  Resume your normal diet. There are no special food restriction following this procedure. A low  fat/low cholesterol diet is recommended for all patients with vascular disease. After your aortic surgery, it's normal to feel full faster than usual and to not feel as hungry as you normally would. You will probably lose weight initially following your surgery. It's best to eat small, frequent meals over the course of the day. Call the office if you find that you are unable to eat even small meals.   In order to heal from your surgery, it is CRITICAL to get adequate nutrition. Your body requires vitamins, minerals, and protein. Vegetables are the best source of vitamins and minerals.  If you have pain, you may take over-the-counter pain reliever such as acetaminophen (Tylenol). If you were prescribed a stronger pain medication, please be aware these medication can cause nausea and constipation. Prevent nausea by taking the medication with a snack or meal. Avoid constipation by drinking plenty of fluids and eating foods with a high amount of fiber, such as fruits, vegetables and grains. Take '100mg'$  of the over-the-counter stool softener Colace twice a day as needed to help with constipation. A laxative, such as Milk of Magnesia, may be recommended for you at this time. Do not take a laxative unless your surgeon or P.A. tells you it's OK.  Do not take Tylenol if you are taking stronger pain medications (such as Percocet).  Follow Up  Our office will schedule a follow up appointment 2-3 weeks after discharge.  Please call us immediately for any of the following conditions    .     Severe or worsening pain in your legs or feet or in your abdomen back or chest. . Increased pain, redness drainage (pus) from your incision site. . Increased abdominal pain, bloating, nausea, vomiting, or persistent diarrhea. . Fever of 101 degrees or higher. . Swelling in your leg (s). .  Reduce your risk of vascular disease  . Stop smoking. If you would like help, call [facility] at 1-800-QUIT-NOW (619)620-8877) or  [place] at (928)878-0717. . Manage your cholesterol . Maintain a desired weight . Control your diabetes . Keep your blood pressure down .  If you have any questions please call the office at [phone].   Prescriptions given: 1.  Roxicet #20 No Refill 2.  Eliquis '5mg'$  bid #60 two refills 3.  Metoprolol '50mg'$  bid #60 one refill (refills per PCP or cardiology) 4.  Lasix '20mg'$  daily #30 No refill (refills per PCP or cardiology) 5.  K-dur 20 mEq daily #30 No refills (refills per PCP or cardiology) 6.  Xopenex 1.'25mg'$  neb q6h prn wheezing  One refill 7.  nebulizer  Disposition: home  Patient's condition: is Good  Follow up: 1. Dr. Donnetta Hutching in 2 weeks 2. Cardiology early next week for BMP and then 2-3 weeks follow up with MD   [place] Bar  Darl Pikes [facility] [phone] 05/30/2018  9:39 AM   - For VQI Registry use -  Post-op:  Time to Extubation: '[]'$  In OR, '[ ]'$  < 12 hrs, [x ] 12-24 hrs, '[ ]'$  >=24 hrs Vasopressors Req. Post-op: No ICU Stay:11 day in ICU Transfusion: Yes   If yes, 10 units PRBC's and 6 units FFP units given MI: No, '[ ]'$  Troponin only, '[ ]'$  EKG or Clinical New Arrhythmia: No  Complications: CHF: No Resp failure: Yes, '[ ]'$  Pneumonia, '[ ]'$  Ventilator Chg in renal function: Yes, [x ] Inc. Cr > 0.5 but improved, '[ ]'$  Temp. Dialysis, '[ ]'$  Permanent dialysis Leg ischemia: No, no Surgery needed, '[ ]'$  Yes, Surgery needed, '[ ]'$  Amputation Bowel ischemia: No, '[ ]'$  Medical Rx, '[ ]'$  Surgical Rx Wound complication: No, '[ ]'$  Superficial separation/infection, '[ ]'$  Return to OR Return to OR: No  Return to OR for bleeding: No Stroke: No, '[ ]'$  Minor, '[ ]'$  Major  Discharge medications: Statin use:  Yes  ASA use:  No   Plavix use:  No  Beta blocker use:  Yes  ACEI use:  No ARB use:  No CCB use:  No Coumadin use:  No Eliquis:  Yes.

## 2018-05-30 NOTE — Progress Notes (Signed)
Patient ID: Leah Mata, female   DOB: 08-Jan-1946, 73 y.o.   MRN: [id number]  Progress Note    05/30/2018 7:13 AM 32 Days Post-Op  Subjective: Comfortable this morning.  No nausea or vomiting.  Feels she is ready for discharge to home   Vitals:   05/30/18 0038 05/30/18 0454  BP:    Pulse:    Resp:    Temp: 98.5 F (36.9 C) 98.6 F (37 C)  SpO2:     Physical Exam: Abdomen soft and nontender.  Old hematoma drainage from the lower pole of incision. No edema in her arms.  Continued with pitting edema in both lower extremities  CBC    Component Value Date/Time   WBC 7.6 05/29/2018 0304   RBC 3.28 (L) 05/29/2018 0304   HGB 9.7 (L) 05/29/2018 0304   HCT 30.1 (L) 05/29/2018 0304   PLT 165 05/29/2018 0304   MCV 91.8 05/29/2018 0304   MCH 29.6 05/29/2018 0304   MCHC 32.2 05/29/2018 0304   RDW 16.1 (H) 05/29/2018 0304   LYMPHSABS 0.8 05/15/2018 0657   MONOABS 2.0 (H) 05/15/2018 0657   EOSABS 0.1 05/15/2018 0657   BASOSABS 0.1 05/15/2018 0657    BMET    Component Value Date/Time   NA 132 (L) 05/30/2018 0258   K 4.1 05/30/2018 0258   CL 99 05/30/2018 0258   CO2 26 05/30/2018 0258   GLUCOSE 105 (H) 05/30/2018 0258   BUN 10 05/30/2018 0258   CREATININE 1.18 (H) 05/30/2018 0258   CALCIUM 7.6 (L) 05/30/2018 0258   GFRNONAA 46 (L) 05/30/2018 0258   GFRAA 54 (L) 05/30/2018 0258    INR    Component Value Date/Time   INR 1.16 04/30/2018 0413     Intake/Output Summary (Last 24 hours) at 05/30/2018 0713 Last data filed at 05/30/2018 0600 Gross per 24 hour  Intake 2075 ml  Output 2750 ml  Net -675 ml     Assessment/Plan:  73 y.o. female stable month status post ruptured abdominal aortic aneurysm and 2 issues of ileus versus small bowel obstruction.  Doing well and ready for discharge to home.     Larina Earthly, MD FACS [facility] [phone] 05/30/2018 7:13 AM

## 2018-05-30 NOTE — Telephone Encounter (Signed)
Pt was on TCM report admitted 04/28/18 for abdominal and right flank pain. Pt had a  CT scan showed large greater than 8 cm infrarenal aneurysm with contained rupture in the right retroperitoneal. Pt underwent ANEURYSM ABDOMINAL AORTIC REPAIR WITH HEMASHIELD GOLD VASCULAR GRAFT . Pt transferred to [facility] 2H CARDIOVASCULAR ICU 04/29/18 up til 05/09/18 she transferred out and was in [facility] 4E CV SURGICAL PROGRESSIVE CARE . Pt D/C on 05/30/18, and will follow-up w/ [facility] (Cardiology) in 1 week and see surgeon in 2 weeks...Raechel Chute

## 2018-05-31 DIAGNOSIS — Z48812 Encounter for surgical aftercare following surgery on the circulatory system: Secondary | ICD-10-CM | POA: Diagnosis not present

## 2018-05-31 DIAGNOSIS — Z4800 Encounter for change or removal of nonsurgical wound dressing: Secondary | ICD-10-CM | POA: Diagnosis not present

## 2018-05-31 DIAGNOSIS — J449 Chronic obstructive pulmonary disease, unspecified: Secondary | ICD-10-CM | POA: Diagnosis not present

## 2018-05-31 DIAGNOSIS — I509 Heart failure, unspecified: Secondary | ICD-10-CM | POA: Diagnosis not present

## 2018-05-31 DIAGNOSIS — I110 Hypertensive heart disease with heart failure: Secondary | ICD-10-CM | POA: Diagnosis not present

## 2018-05-31 DIAGNOSIS — I480 Paroxysmal atrial fibrillation: Secondary | ICD-10-CM | POA: Diagnosis not present

## 2018-05-31 DIAGNOSIS — L89312 Pressure ulcer of right buttock, stage 2: Secondary | ICD-10-CM | POA: Diagnosis not present

## 2018-06-01 ENCOUNTER — Telehealth: Payer: Self-pay | Admitting: Cardiology

## 2018-06-01 DIAGNOSIS — I480 Paroxysmal atrial fibrillation: Secondary | ICD-10-CM

## 2018-06-01 NOTE — Telephone Encounter (Signed)
Attempted to contact patient, per discharge instructions she is suppose to have BMP completed a week before appointment in office. I tried to call, patient phone rang x8 times. Not able to leave message. Will order labs, and will attempt to contact patient again.

## 2018-06-01 NOTE — Telephone Encounter (Signed)
New Message:    Patient calling, because she just got out the hospital and stated that she need to have some lab work done before her appt. There was no order in Epic for that lab work. Please contact patient if she needs to call back and schudule.

## 2018-06-02 DIAGNOSIS — J449 Chronic obstructive pulmonary disease, unspecified: Secondary | ICD-10-CM | POA: Diagnosis not present

## 2018-06-02 DIAGNOSIS — L89312 Pressure ulcer of right buttock, stage 2: Secondary | ICD-10-CM | POA: Diagnosis not present

## 2018-06-02 DIAGNOSIS — I110 Hypertensive heart disease with heart failure: Secondary | ICD-10-CM | POA: Diagnosis not present

## 2018-06-02 DIAGNOSIS — I509 Heart failure, unspecified: Secondary | ICD-10-CM | POA: Diagnosis not present

## 2018-06-02 DIAGNOSIS — Z4800 Encounter for change or removal of nonsurgical wound dressing: Secondary | ICD-10-CM | POA: Diagnosis not present

## 2018-06-02 DIAGNOSIS — I480 Paroxysmal atrial fibrillation: Secondary | ICD-10-CM | POA: Diagnosis not present

## 2018-06-02 DIAGNOSIS — Z48812 Encounter for surgical aftercare following surgery on the circulatory system: Secondary | ICD-10-CM | POA: Diagnosis not present

## 2018-06-02 NOTE — Telephone Encounter (Signed)
Tried to cakk pt no VM available-"call back later"

## 2018-06-05 ENCOUNTER — Ambulatory Visit: Payer: Self-pay | Admitting: Internal Medicine

## 2018-06-05 DIAGNOSIS — I480 Paroxysmal atrial fibrillation: Secondary | ICD-10-CM | POA: Diagnosis not present

## 2018-06-05 DIAGNOSIS — I509 Heart failure, unspecified: Secondary | ICD-10-CM | POA: Diagnosis not present

## 2018-06-05 DIAGNOSIS — I110 Hypertensive heart disease with heart failure: Secondary | ICD-10-CM | POA: Diagnosis not present

## 2018-06-05 DIAGNOSIS — J449 Chronic obstructive pulmonary disease, unspecified: Secondary | ICD-10-CM | POA: Diagnosis not present

## 2018-06-05 DIAGNOSIS — L89312 Pressure ulcer of right buttock, stage 2: Secondary | ICD-10-CM | POA: Diagnosis not present

## 2018-06-05 DIAGNOSIS — Z48812 Encounter for surgical aftercare following surgery on the circulatory system: Secondary | ICD-10-CM | POA: Diagnosis not present

## 2018-06-05 DIAGNOSIS — Z4800 Encounter for change or removal of nonsurgical wound dressing: Secondary | ICD-10-CM | POA: Diagnosis not present

## 2018-06-05 NOTE — Telephone Encounter (Signed)
Leah Sandy, RN [facility]. She is seeing pt today.   Pt has appt with Dr. Jonny Ruiz tomorrow.    She was in hospital.   Ankles are swollen up to  upper thighs to feet 3 plus edema.      Getting Lasix 20 mg.   In hospital was getting 80 mg Lasix twice a day.    No shortness of breath or chest pain.  Legs hurting because of the tightness.   Left is slightly larger.    Ruptured AAA repair.    No ankle swelling prior to ruptured AAA repair.   She had a complicated recovery in the hospital.       Leah Mata [phone] or call pt directly with orders.  I sent these notes to Dr. Raphael Gibney nurse pool for further disposition as a high priority.

## 2018-06-05 NOTE — Telephone Encounter (Signed)
Ok to see at [facility] as planned

## 2018-06-05 NOTE — Telephone Encounter (Signed)
Please advise 

## 2018-06-06 ENCOUNTER — Encounter: Payer: Self-pay | Admitting: Internal Medicine

## 2018-06-06 ENCOUNTER — Telehealth: Payer: Self-pay | Admitting: Vascular Surgery

## 2018-06-06 ENCOUNTER — Ambulatory Visit: Payer: Medicare PPO | Admitting: Internal Medicine

## 2018-06-06 ENCOUNTER — Other Ambulatory Visit (INDEPENDENT_AMBULATORY_CARE_PROVIDER_SITE_OTHER): Payer: Medicare PPO

## 2018-06-06 VITALS — BP 152/86 | HR 76 | Temp 98.0°F | Ht 64.0 in | Wt 178.0 lb

## 2018-06-06 DIAGNOSIS — K5900 Constipation, unspecified: Secondary | ICD-10-CM

## 2018-06-06 DIAGNOSIS — J449 Chronic obstructive pulmonary disease, unspecified: Secondary | ICD-10-CM

## 2018-06-06 DIAGNOSIS — F419 Anxiety disorder, unspecified: Secondary | ICD-10-CM | POA: Diagnosis not present

## 2018-06-06 DIAGNOSIS — J9601 Acute respiratory failure with hypoxia: Secondary | ICD-10-CM | POA: Diagnosis not present

## 2018-06-06 DIAGNOSIS — I714 Abdominal aortic aneurysm, without rupture, unspecified: Secondary | ICD-10-CM

## 2018-06-06 DIAGNOSIS — R7303 Prediabetes: Secondary | ICD-10-CM

## 2018-06-06 DIAGNOSIS — G4700 Insomnia, unspecified: Secondary | ICD-10-CM | POA: Diagnosis not present

## 2018-06-06 DIAGNOSIS — I5189 Other ill-defined heart diseases: Secondary | ICD-10-CM | POA: Insufficient documentation

## 2018-06-06 LAB — BASIC METABOLIC PANEL
BUN: 18 mg/dL (ref 6–23)
CHLORIDE: 102 meq/L (ref 96–112)
CO2: 29 meq/L (ref 19–32)
Calcium: 8.7 mg/dL (ref 8.4–10.5)
Creatinine, Ser: 0.9 mg/dL (ref 0.40–1.20)
GFR: 61.69 mL/min (ref >60.00)
Glucose, Bld: 100 mg/dL — ABNORMAL HIGH (ref 70–99)
Potassium: 4.7 mEq/L (ref 3.5–5.1)
Sodium: 138 mEq/L (ref 135–145)

## 2018-06-06 LAB — CBC WITH DIFFERENTIAL/PLATELET
BASOS ABS: 0.1 10*3/uL (ref 0.0–0.1)
Basophils Relative: 0.8 % (ref 0.0–3.0)
EOS ABS: 0.1 10*3/uL (ref 0.0–0.7)
Eosinophils Relative: 0.8 % (ref 0.0–5.0)
HCT: 30 % — ABNORMAL LOW (ref 36.0–46.0)
Hemoglobin: 10 g/dL — ABNORMAL LOW (ref 12.0–15.0)
Lymphocytes Relative: 18 % (ref 12.0–46.0)
Lymphs Abs: 1.7 10*3/uL (ref 0.7–4.0)
MCHC: 33.3 g/dL (ref 30.0–36.0)
MCV: 90.9 fl (ref 78.0–100.0)
Monocytes Absolute: 1.2 10*3/uL — ABNORMAL HIGH (ref 0.1–1.0)
Monocytes Relative: 12.7 % — ABNORMAL HIGH (ref 3.0–12.0)
Neutro Abs: 6.2 10*3/uL (ref 1.4–7.7)
Neutrophils Relative %: 67.7 % (ref 43.0–77.0)
Platelets: 201 10*3/uL (ref 150.0–400.0)
RBC: 3.3 Mil/uL — ABNORMAL LOW (ref 3.87–5.11)
RDW: 17.2 % — ABNORMAL HIGH (ref 11.5–15.5)
WBC: 9.2 10*3/uL (ref 4.0–10.5)

## 2018-06-06 MED ORDER — LEVALBUTEROL HCL 1.25 MG/0.5ML IN NEBU: 1.2500 mg | INHALATION_SOLUTION | Freq: Four times a day (QID) | RESPIRATORY_TRACT | 3 refills | Status: DC | PRN

## 2018-06-06 MED ORDER — FUROSEMIDE 40 MG PO TABS: 40.0000 mg | ORAL_TABLET | Freq: Every day | ORAL | 3 refills | Status: DC

## 2018-06-06 MED ORDER — BUSPIRONE HCL 10 MG PO TABS: 10.0000 mg | ORAL_TABLET | Freq: Three times a day (TID) | ORAL | 2 refills | Status: DC | PRN

## 2018-06-06 MED ORDER — TEMAZEPAM 15 MG PO CAPS: 15.0000 mg | ORAL_CAPSULE | Freq: Every evening | ORAL | 5 refills | Status: DC | PRN

## 2018-06-06 NOTE — Patient Instructions (Addendum)
OK to try miralax daily, or senakot at bedtime for constipation  Please take all new medication as prescribed - the buspar for nerves, and temazepam for sleep  Ok to increase the lasix to 40 mg in the AM  Please check your weights in the AM every day, with the goal being to have 1/2 - 1 lb decrease every day;  Please call in 5-7 days if this is not the case, as you may need the lasix twice per day  Please continue all other medications as before, and refills have been done if requested - the nebulizer  Please have the pharmacy call with any other refills you may need.  Please continue your efforts at being more active, low cholesterol diet, and weight control.  You are otherwise up to date with prevention measures today.  Please keep your appointments with your specialists as you may have planned  Please go to the LAB in the Basement (turn left off the elevator) for the tests to be done today  Please go to the LAB in the Basement (turn left off the elevator) for the tests to be done in One WEEK  You will be contacted by phone if any changes need to be made immediately.  Otherwise, you will receive a letter about your results with an explanation, but please check with MyChart first.  Please remember to sign up for MyChart if you have not done so, as this will be important to you in the future with finding out test results, communicating by private email, and scheduling acute appointments online when needed.  Please return in 3 months, or sooner if needed

## 2018-06-06 NOTE — Progress Notes (Signed)
Subjective:    Patient ID: Leah Mata, female    DOB: 08-06-1945, 73 y.o.   MRN: [id number]  HPI 73 y.o. female patient presented to [facility] with abdominal and right flank pain. CT scan showed large greater than 8 cm infrarenal aneurysm with contained rupture in the right retroperitoneal space. She was directed to come immediately to [facility] to the operating room.   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  Was seen per pulmonary with need for Bipap at one point, and sinus tach,  Echo with normal EF and diastolic dysfunction, mild dilation of aortic root.   Recovery was prolonged but successful after 32 days post op., with d/c date mar 3.  D/c Hgb 9.7, stable, final creatinine improved to 1.14.  C/o persistent leg swelling.  Has been checking weights at home, was increasing the first few days, then coming down last few days per pt.  Pt denies chest pain, increased sob or doe, wheezing, orthopnea, PND, palpitations, dizziness or syncope. Wt Readings from Last 3 Encounters:  06/06/18 178 lb (80.7 kg)  05/30/18 171 lb 9.6 oz (77.8 kg)  04/06/18 161 lb 8 oz (73.3 kg)  Pt no longer smoking since admit, now with marked increased anxiety, and cannot sleep well with getting to sleep most day.  Also with mild intermittent consipation, asks for laxative advice,  Needs BNP today, also Nebulizer refill Past Medical History:  Diagnosis Date  . BACK PAIN 03/14/2009  . COPD 11/01/2006  . GLUCOSE INTOLERANCE 03/14/2009  . Headache(784.0) 03/14/2009  . HYPERTENSION 11/01/2006  . 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 INSOMNIA, HX OF 01/06/2007  . Other and unspecified hyperlipidemia 08/22/2013  . Urinary frequency 03/14/2009   Past Surgical History:  Procedure Laterality Date  . ABDOMINAL AORTIC ANEURYSM REPAIR N/A  04/28/2018   Procedure: ANEURYSM ABDOMINAL AORTIC REPAIR WITH HEMASHIELD GOLD VASCULAR GRAFT 22X11 mm;  Surgeon: Larina Earthly, MD;  Location: [facility] OR;  Service: Vascular;  Laterality: N/A;  . CARDIAC CATHETERIZATION  2003   negative  . DILATION AND CURETTAGE OF UTERUS    . TONSILLECTOMY    . TUBAL LIGATION      reports that she has been smoking. She has never used smokeless tobacco. She reports that she does not drink alcohol or use drugs. family history includes Congestive Heart Failure in her mother; Coronary artery disease in an other family member; Diabetes in her father and mother; Heart attack in her father; Hypertension in her mother. Allergies  Allergen Reactions  . Hydrochlorothiazide    Current Outpatient Medications on File Prior to Visit  Medication Sig Dispense Refill  . albuterol (VENTOLIN HFA) 108 (90 Base) MCG/ACT inhaler Inhale 2 puffs into the lungs every 6 (six) hours as needed for wheezing or shortness of breath. 3 Inhaler 3  . apixaban (ELIQUIS) 5 MG TABS tablet Take 1 tablet (5 mg total) by mouth 2 (two) times daily. 60 tablet 2  . furosemide (LASIX) 20 MG tablet Take 1 tablet (20 mg total) by mouth daily. 30 tablet 0  . levalbuterol (XOPENEX) 1.25 MG/0.5ML nebulizer solution Take 1.25 mg by nebulization every 6 (six) hours as needed for wheezing or shortness of breath. 1 each 1  . metoprolol tartrate (LOPRESSOR) 50 MG tablet Take 1 tablet (50 mg total) by mouth  2 (two) times daily. 60 tablet 2  . oxyCODONE-acetaminophen (PERCOCET/ROXICET) 5-325 MG tablet Take 1 tablet by mouth every 6 (six) hours as needed for severe pain. 20 tablet 0  . potassium chloride (K-DUR) 10 MEQ tablet Take 2 tablets (20 mEq total) by mouth daily. 60 tablet 0  . pravastatin (PRAVACHOL) 20 MG tablet Take 1 tablet (20 mg total) by mouth daily. 90 tablet 3  . fluticasone (FLONASE) 50 MCG/ACT nasal spray Place 2 sprays into both nostrils daily. 48 g 3   No current facility-administered medications  on file prior to visit.    Review of Systems  Constitutional: Negative for other unusual diaphoresis or sweats HENT: Negative for ear discharge or swelling Eyes: Negative for other worsening visual disturbances Respiratory: Negative for stridor or other swelling  Gastrointestinal: Negative for worsening distension or other blood Genitourinary: Negative for retention or other urinary change Musculoskeletal: Negative for other MSK pain or swelling Skin: Negative for color change or other new lesions Neurological: Negative for worsening tremors and other numbness  Psychiatric/Behavioral: Negative for worsening agitation or other fatigue All other system neg per pt    Objective:   Physical Exam BP (!) 152/86   Pulse 76   Temp 98 F (36.7 C) (Oral)   Ht 5\' 4"  (1.626 m)   Wt 178 lb (80.7 kg)   LMP 03/29/1993 (Approximate)   SpO2 91%   BMI 30.55 kg/m  VS noted,  Constitutional: Pt appears in NAD HENT: Head: NCAT.  Right Ear: External ear normal.  Left Ear: External ear normal.  Eyes: . Pupils are equal, round, and reactive to light. Conjunctivae and EOM are normal Nose: without d/c or deformity Neck: Neck supple. Gross normal ROM Cardiovascular: Normal rate and regular rhythm.   Pulmonary/Chest: Effort normal and breath sounds decreased without rales or wheezing.  Abd:  Soft, NT, ND, + BS, no organomegaly Neurological: Pt is alert. At baseline orientation, motor grossly intact Skin: Skin is warm. No rashes, other new lesions, has 2+ bilat LE edema to mid thigh Psychiatric: Pt behavior is normal without agitation , 1-2+ nervous No other exam findings    Assessment & Plan:

## 2018-06-06 NOTE — Assessment & Plan Note (Signed)
Resolved,  to f/u any worsening symptoms or concerns  

## 2018-06-06 NOTE — Assessment & Plan Note (Signed)
Ok for temazepam qhs prn,  to f/u any worsening symptoms or concerns 

## 2018-06-06 NOTE — Telephone Encounter (Signed)
Called mobile number [phone] [facility] about her upcoming appt and will mail out paperwork

## 2018-06-06 NOTE — Assessment & Plan Note (Signed)
For BNP today, for increased lasix to 40 qd, consider bid if not losing wt in one wk; will need repeat BNP in 1 wk

## 2018-06-06 NOTE — Assessment & Plan Note (Signed)
Situational worsening, also quit smoking, for buspar asd,  to f/u any worsening symptoms or concerns

## 2018-06-06 NOTE — Assessment & Plan Note (Signed)
stable overall by history and exam, recent data reviewed with pt, and pt to continue medical treatment as before,  to f/u any worsening symptoms or concerns  

## 2018-06-06 NOTE — Telephone Encounter (Signed)
Attempted to contact patient again regarding lab work. Unable to leave message. Rang x8 times. This is 3rd attempt, I will remove from triage pool.

## 2018-06-06 NOTE — Assessment & Plan Note (Addendum)
stable overall by history and exam, recent data reviewed with pt, and pt to continue medical treatment as before,  to f/u any worsening symptoms or concerns  Note:  Total time for pt hx, exam, review of record with pt in the room, determination of diagnoses and plan for further eval and tx is > 40 min, with over 50% spent in coordination and counseling of patient including the differential dx, tx, further evaluation and other management of AAA rupture, copd, acute resp failure, diast dysfxn, preDM, constipatoin, anxiety, insomnia

## 2018-06-06 NOTE — Assessment & Plan Note (Signed)
For otc miralax and.or senakot qhs as needed

## 2018-06-07 ENCOUNTER — Encounter: Payer: Self-pay | Admitting: Internal Medicine

## 2018-06-07 MED ORDER — PROMETHAZINE-DM 6.25-15 MG/5ML PO SYRP: 5.0000 mL | ORAL_SOLUTION | Freq: Four times a day (QID) | ORAL | 1 refills | Status: DC | PRN

## 2018-06-08 DIAGNOSIS — Z48812 Encounter for surgical aftercare following surgery on the circulatory system: Secondary | ICD-10-CM | POA: Diagnosis not present

## 2018-06-08 DIAGNOSIS — I509 Heart failure, unspecified: Secondary | ICD-10-CM | POA: Diagnosis not present

## 2018-06-08 DIAGNOSIS — I110 Hypertensive heart disease with heart failure: Secondary | ICD-10-CM | POA: Diagnosis not present

## 2018-06-08 DIAGNOSIS — Z4800 Encounter for change or removal of nonsurgical wound dressing: Secondary | ICD-10-CM | POA: Diagnosis not present

## 2018-06-08 DIAGNOSIS — I480 Paroxysmal atrial fibrillation: Secondary | ICD-10-CM | POA: Diagnosis not present

## 2018-06-08 DIAGNOSIS — L89312 Pressure ulcer of right buttock, stage 2: Secondary | ICD-10-CM | POA: Diagnosis not present

## 2018-06-08 DIAGNOSIS — J449 Chronic obstructive pulmonary disease, unspecified: Secondary | ICD-10-CM | POA: Diagnosis not present

## 2018-06-09 NOTE — Telephone Encounter (Addendum)
Pt was contacted.  She says she would like to have the lab done at Dr. Raphael Gibney office.  Orders are in Epic.  The nurse at [facility] will check with Dr. Melvyn Novas and contact patient.  Ernst Spell., LPN.   ----- Message from Larina Earthly, MD sent at 06/06/2018 12:46 PM EDT ----- Regarding: RE: Medication Yes.  Needs a basis metabolic panel lab work to check her potassium prior to seeing me back [facility] ----- Message ----- From: Yolonda Kida, LPN Sent: 07/04/927  11:43 AM EDT To: Larina Earthly, MD Subject: Medication                                     Hey Dr. Arbie Cookey.  This patient says she had a lot of swelling in her leg and took an extra Lasix (Total 40mg ).  Swelling has gone down.  Can she continue to take 40mg  a day?  Ernst Spell., LPN

## 2018-06-09 NOTE — Telephone Encounter (Signed)
I called patient to relay Dr. Bosie Helper message.  No answer.  Ernst Spell., LPN

## 2018-06-12 ENCOUNTER — Encounter: Payer: Self-pay | Admitting: Internal Medicine

## 2018-06-12 ENCOUNTER — Other Ambulatory Visit: Payer: Self-pay | Admitting: Vascular Surgery

## 2018-06-12 ENCOUNTER — Telehealth: Payer: Self-pay | Admitting: Internal Medicine

## 2018-06-12 DIAGNOSIS — I10 Essential (primary) hypertension: Secondary | ICD-10-CM

## 2018-06-12 DIAGNOSIS — N289 Disorder of kidney and ureter, unspecified: Secondary | ICD-10-CM

## 2018-06-12 NOTE — Telephone Encounter (Signed)
Informed office

## 2018-06-12 NOTE — Telephone Encounter (Signed)
Dr Tawanna Cooler from [facility] would like for the patient to have a BASIC METABOLIC PANEL WITH GFR drawn. The order has been entered but the patient would like to have it drawn here. Is this possible? Please advise.

## 2018-06-12 NOTE — Telephone Encounter (Signed)
Sorry but unfortunately we do not draw lab for any providers other than [facility] physicians.

## 2018-06-13 DIAGNOSIS — L89312 Pressure ulcer of right buttock, stage 2: Secondary | ICD-10-CM | POA: Diagnosis not present

## 2018-06-13 DIAGNOSIS — Z4800 Encounter for change or removal of nonsurgical wound dressing: Secondary | ICD-10-CM | POA: Diagnosis not present

## 2018-06-13 DIAGNOSIS — I509 Heart failure, unspecified: Secondary | ICD-10-CM | POA: Diagnosis not present

## 2018-06-13 DIAGNOSIS — I110 Hypertensive heart disease with heart failure: Secondary | ICD-10-CM | POA: Diagnosis not present

## 2018-06-13 DIAGNOSIS — J449 Chronic obstructive pulmonary disease, unspecified: Secondary | ICD-10-CM | POA: Diagnosis not present

## 2018-06-13 DIAGNOSIS — Z48812 Encounter for surgical aftercare following surgery on the circulatory system: Secondary | ICD-10-CM | POA: Diagnosis not present

## 2018-06-13 DIAGNOSIS — I480 Paroxysmal atrial fibrillation: Secondary | ICD-10-CM | POA: Diagnosis not present

## 2018-06-14 ENCOUNTER — Encounter: Payer: Self-pay | Admitting: Cardiology

## 2018-06-14 ENCOUNTER — Ambulatory Visit: Payer: Medicare PPO | Admitting: Cardiology

## 2018-06-14 ENCOUNTER — Other Ambulatory Visit: Payer: Self-pay

## 2018-06-14 ENCOUNTER — Other Ambulatory Visit (INDEPENDENT_AMBULATORY_CARE_PROVIDER_SITE_OTHER): Payer: Medicare PPO

## 2018-06-14 VITALS — BP 152/68 | HR 70 | Ht 64.0 in | Wt 161.4 lb

## 2018-06-14 DIAGNOSIS — Z7189 Other specified counseling: Secondary | ICD-10-CM

## 2018-06-14 DIAGNOSIS — I4892 Unspecified atrial flutter: Secondary | ICD-10-CM | POA: Diagnosis not present

## 2018-06-14 DIAGNOSIS — Z9189 Other specified personal risk factors, not elsewhere classified: Secondary | ICD-10-CM

## 2018-06-14 DIAGNOSIS — Z79899 Other long term (current) drug therapy: Secondary | ICD-10-CM | POA: Diagnosis not present

## 2018-06-14 DIAGNOSIS — E7800 Pure hypercholesterolemia, unspecified: Secondary | ICD-10-CM

## 2018-06-14 DIAGNOSIS — I480 Paroxysmal atrial fibrillation: Secondary | ICD-10-CM

## 2018-06-14 DIAGNOSIS — I5032 Chronic diastolic (congestive) heart failure: Secondary | ICD-10-CM | POA: Diagnosis not present

## 2018-06-14 DIAGNOSIS — IMO0001 Reserved for inherently not codable concepts without codable children: Secondary | ICD-10-CM

## 2018-06-14 DIAGNOSIS — N289 Disorder of kidney and ureter, unspecified: Secondary | ICD-10-CM | POA: Diagnosis not present

## 2018-06-14 DIAGNOSIS — I5189 Other ill-defined heart diseases: Secondary | ICD-10-CM

## 2018-06-14 DIAGNOSIS — I10 Essential (primary) hypertension: Secondary | ICD-10-CM | POA: Diagnosis not present

## 2018-06-14 DIAGNOSIS — Z9889 Other specified postprocedural states: Secondary | ICD-10-CM

## 2018-06-14 LAB — BASIC METABOLIC PANEL
BUN: 13 mg/dL (ref 6–23)
CO2: 31 mEq/L (ref 19–32)
Calcium: 8.8 mg/dL (ref 8.4–10.5)
Chloride: 102 mEq/L (ref 96–112)
Creatinine, Ser: 0.96 mg/dL (ref 0.40–1.20)
GFR: 57.26 mL/min — AB (ref >60.00)
Glucose, Bld: 104 mg/dL — ABNORMAL HIGH (ref 70–99)
Potassium: 3.4 mEq/L — ABNORMAL LOW (ref 3.5–5.1)
Sodium: 139 mEq/L (ref 135–145)

## 2018-06-14 MED ORDER — LISINOPRIL 20 MG PO TABS: 20.0000 mg | ORAL_TABLET | Freq: Every day | ORAL | 3 refills | Status: DC

## 2018-06-14 NOTE — Patient Instructions (Addendum)
Medication Instructions:  Start: Lisinopril 20 mg daily  If you need a refill on your cardiac medications before your next appointment, please call your pharmacy.   Lab work: Your physician recommends that you return for lab work in 7-14 day ( BMP0  If you have labs (blood work) drawn today and your tests are completely normal, you will receive your results only by: Marland Kitchen MyChart Message (if you have MyChart) OR . A paper copy in the mail If you have any lab test that is abnormal or we need to change your treatment, we will call you to review the results.  Testing/Procedures: None  Follow-Up: At [facility], you and your health needs are our priority.  As part of our continuing mission to provide you with exceptional heart care, we have created designated Provider Care Teams.  These Care Teams include your primary Cardiologist (physician) and Advanced Practice Providers (APPs -  Physician Assistants and Nurse Practitioners) who all work together to provide you with the care you need, when you need it. You will need a follow up appointment in 3 months.  Please call our office 2 months in advance to schedule this appointment.  You may see Dr. Cristal Deer or one of the following Advanced Practice Providers on your designated Care Team:   Theodore Demark, PA-C . Joni Reining, DNP, ANP  Any Other Special Instructions Will Be Listed Below (If Applicable).  For constipation: use miralax powder daily for softening. If you still have issues with constipation, use bisacodyl or senna for a squeezing medication.  Recommend getting home blood pressure cuff for measurement.  Do the following things EVERY DAY:  1) Weigh yourself EVERY morning after you go to the bathroom but before you eat or drink anything. Write this number down in a weight log/diary. If you gain 3 pounds overnight or 5 pounds in a week, call the office.  2) Take your medicines as prescribed. If you have concerns about your  medications, please call us before you stop taking them.   3) Eat low salt foods-Limit salt (sodium) to 2000 mg per day. This will help prevent your body from holding onto fluid. Read food labels as many processed foods have a lot of sodium, especially canned goods and prepackaged meats. If you would like some assistance choosing low sodium foods, we would be happy to set you up with a nutritionist.  4) Stay as active as you can everyday. Staying active will give you more energy and make your muscles stronger. Start with 5 minutes at a time and work your way up to 30 minutes a day. Break up your activities--do some in the morning and some in the afternoon. Start with 3 days per week and work your way up to 5 days as you can.  If you have chest pain, feel short of breath, dizzy, or lightheaded, STOP. If you don't feel better after a short rest, call 911. If you do feel better, call the office to let us know you have symptoms with exercise.  5) Limit all fluids for the day to less than 2 liters. Fluid includes all drinks, coffee, juice, ice chips, soup, jello, and all other liquids.

## 2018-06-14 NOTE — Progress Notes (Signed)
Cardiology Office Note:    Date:  06/14/2018   ID:  Leah Mata, DOB Nov 22, 1945, MRN [id number]  PCP:  Corwin Levins, MD  Cardiologist:  Jodelle Red, MD PhD  Referring MD: Corwin Levins, MD   Chief Complaint  Patient presents with  . Follow-up    pt denied chest pain  Transition of care visit, 14 days since hospitalization  History of Present Illness:    EMMILEE Mata is a 73 y.o. female with a hx of hypertension, hyperlipidemia, AAA who is seen for the evaluation and management of post hospital follow up of ruptured abdominal aortic aneurysm with surgery on 04/29/2018. She had a >30 day admission, with discharge on 05/30/18.  Reviewed hospital notes, including consult note from Dr. Elease Hashimoto (as well as follow up notes from Dr. Lenox Ahr, Renae Fickle, and Croitoru). She had surgery for acute aortic aneurysm rupture on 04/29/18. She underwent open repair by Dr. Arbie Cookey of 8.3 cm infrarenal AAA with grafts. Her post op course was complicated by aspiration pneumonia, ileus and atrial fibrillation/atrial flutter with RVR. Cardiology was consulted on 2/7. Her telemetry showed multifocal atrial tachycardia and frequent PACs as well as intermitent fib/flutter. She was treated with amiodarone, with gradual cessation of arrhythmia and maintenance of NSR for many days, but then returned to atrial flutter with 2:1 conduction. She was started on apixaban on 2/24 for this. She spontaneously converted to NSR within 24 hours. She had to be converted to heparin when she was NPO. No documented bleeding issues.  She had very labile blood pressures, some low enough that doses of medication had to be held, and some with systolic >200. She was also diuresed for acute diastolic heart failure. Her BP management was difficult as she was NPO for much of her hospitalization, and she was managed with IV hydralazine and labetalol for much of her hospitalization. On discharge on 05/30/18, her blood  pressure was controlled on metoprolol 50 mg BID and furosemide 20 mg daily. Her home amlodipine and lisinopril were discontinued  Concerns today: Blood pressure: brings logs of readings (every other day from home health nurse). Was initially 120/60, but crept to 140/70, then 162/80, then 174/80. Concerned about cost of blood thinner. Refuses coumadin.   Walking without a walker, able to get around the house, do basic chores without issues.  Denies chest pain, shortness of breath at rest or with normal exertion. No PND, orthopnea. No syncope or palpitations.  Felt like she was having issues with her breathing, with some LE edema. Weight was stable, then decreased with increased dose of lasix. Peak weight at home was 177.3, today was 157.2 prior to appointment. Was 161 lbs at home prior to admission. Tried compression stockings, but it makes her legs sore.  Tobacco: not smoking at all. No history of diabetes, did have hypertension history, was on lisinopril 20 mg, amlodipine (thought it was 5 mg, listed as 10 mg) daily prior to admission.  We reviewed her hospitalization and med changes extensively. Discussed atrial fib/flutter, reasons for anticoagulation. She knows people who had a horrible time with coumadin, refuses to try. Cost of apixaban is high for her, but she prefers this over coumadin. Hopes it will be generic soon. No issues with bleeding, reviewed.   Past Medical History:  Diagnosis Date  . BACK PAIN 03/14/2009  . COPD 11/01/2006  . GLUCOSE INTOLERANCE 03/14/2009  . Headache(784.0) 03/14/2009  . HYPERTENSION 11/01/2006  . 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  INSOMNIA, HX OF 01/06/2007  . Other and unspecified hyperlipidemia 08/22/2013  . Urinary frequency 03/14/2009    Past Surgical History:  Procedure Laterality Date  . ABDOMINAL AORTIC ANEURYSM REPAIR N/A 04/28/2018   Procedure: ANEURYSM ABDOMINAL AORTIC REPAIR WITH HEMASHIELD GOLD VASCULAR GRAFT 22X11 mm;  Surgeon:  Larina Earthly, MD;  [facility] OR;  Service: Vascular;  Laterality: N/A;  . CARDIAC CATHETERIZATION  2003   negative  . DILATION AND CURETTAGE OF UTERUS    . TONSILLECTOMY    . TUBAL LIGATION      Current Medications: Current Outpatient Medications on File Prior to Visit  Medication Sig  . albuterol (VENTOLIN HFA) 108 (90 Base) MCG/ACT inhaler Inhale 2 puffs into the lungs every 6 (six) hours as needed for wheezing or shortness of breath.  Marland Kitchen apixaban (ELIQUIS) 5 MG TABS tablet Take 1 tablet (5 mg total) by mouth 2 (two) times daily.  . busPIRone (BUSPAR) 10 MG tablet Take 1 tablet (10 mg total) by mouth 3 (three) times daily as needed.  . furosemide (LASIX) 40 MG tablet Take 1 tablet (40 mg total) by mouth daily.  Marland Kitchen levalbuterol (XOPENEX) 1.25 MG/0.5ML nebulizer solution Take 1.25 mg by nebulization every 6 (six) hours as needed for wheezing or shortness of breath.  . metoprolol tartrate (LOPRESSOR) 50 MG tablet Take 1 tablet (50 mg total) by mouth 2 (two) times daily.  Marland Kitchen oxyCODONE-acetaminophen (PERCOCET/ROXICET) 5-325 MG tablet Take 1 tablet by mouth every 6 (six) hours as needed for severe pain.  . potassium chloride (K-DUR) 10 MEQ tablet Take 2 tablets (20 mEq total) by mouth daily.  . pravastatin (PRAVACHOL) 20 MG tablet Take 1 tablet (20 mg total) by mouth daily.  . promethazine-dextromethorphan (PROMETHAZINE-DM) 6.25-15 MG/5ML syrup Take 5 mLs by mouth 4 (four) times daily as needed for cough.  . temazepam (RESTORIL) 15 MG capsule Take 1 capsule (15 mg total) by mouth at bedtime as needed for sleep.  . fluticasone (FLONASE) 50 MCG/ACT nasal spray Place 2 sprays into both nostrils daily.   No current facility-administered medications on file prior to visit.      Allergies:   Hydrochlorothiazide   Social History   Socioeconomic History  . Marital status: Widowed    Spouse name: Not on file  . Number of children: Not on file  . Years of education: Not on file  . Highest  education level: Not on file  Occupational History  . Not on file  Social Needs  . Financial resource strain: Not on file  . Food insecurity:    Worry: Not on file    Inability: Not on file  . Transportation needs:    Medical: Not on file    Non-medical: Not on file  Tobacco Use  . Smoking status: Current Every Day Smoker  . Smokeless tobacco: Never Used  . Tobacco comment: 2 packs per week  Substance and Sexual Activity  . Alcohol use: No  . Drug use: No  . Sexual activity: Never    Birth control/protection: Surgical    Comment: BTL  Lifestyle  . Physical activity:    Days per week: Not on file    Minutes per session: Not on file  . Stress: Not on file  Relationships  . Social connections:    Talks on phone: Not on file    Gets together: Not on file    Attends religious service: Not on file    Active member of club or organization: Not on  file    Attends meetings of clubs or organizations: Not on file    Relationship status: Not on file  Other Topics Concern  . Not on file  Social History Narrative  . Not on file     Family History: The patient's family history includes Congestive Heart Failure in her mother; Coronary artery disease in an other family member; Diabetes in her father and mother; Heart attack in her father; Hypertension in her mother. There is no history of Colon cancer or Breast cancer.  ROS:   Please see the history of present illness.  Additional pertinent ROS:  Constitutional: Negative for chills, fever, night sweats, unintentional weight loss.  HENT: Negative for ear pain and hearing loss.   Eyes: Negative for loss of vision and eye pain.  Respiratory: Negative for cough, sputum, shortness of breath, wheezing.   Cardiovascular: See HPI. Gastrointestinal: Negative for abdominal pain, melena, and hematochezia.  Genitourinary: Negative for dysuria and hematuria.  Musculoskeletal: Negative for falls and myalgias.  Skin: Negative for itching and  rash.  Neurological: Negative for focal weakness, focal sensory changes and loss of consciousness.  Endo/Heme/Allergies: Does not bruise/bleed easily.    EKGs/Labs/Other Studies Reviewed:    The following studies were reviewed today: Echo 05/03/18 (difficult windows)  1. The left ventricle has hyperdynamic systolic function of >65%. The cavity size is normal. There is no increased left ventricular wall thickness. Echo evidence of impaired diastolic relaxation.  2. The right ventricle has normal systolic function. The cavity in normal in size. There is no increase in right ventricular wall thickness.  3. The mitral valve is normal in structure.  4. The aortic valve has an indeterminant number of cusps.  5. The pulmonic valve is Not well seen. Pulmonic valve regurgitation was not assessed by color flow Doppler.  6. There is mild dilatation of the aortic root.  7. The interatrial septum was not assessed.  8. Extremely limited due to poor sound wave transmission; definity used; vigorous LV systolic function; mild diastolic dysfunction.  EKG:  EKG is personally reviewed.  The ekg ordered today demonstrates normal sinus rhythm, noted as prolonged QT but manually calculated is normal (432)  Recent Labs: 05/07/2018: B Natriuretic Peptide 86.8 05/15/2018: Magnesium 1.8 05/20/2018: ALT 31; TSH 1.723 06/06/2018: BUN 18; Creatinine, Ser 0.90; Hemoglobin 10.0; Platelets 201.0; Potassium 4.7; Sodium 138  Recent Lipid Panel    Component Value Date/Time   CHOL 136 05/09/2018 0242   TRIG 131 05/15/2018 0657   HDL 19 (L) 05/09/2018 0242   CHOLHDL 7.2 05/09/2018 0242   VLDL 49 (H) 05/09/2018 0242   LDLCALC 68 05/09/2018 0242   LDLDIRECT 84.1 01/31/2014 1143    Physical Exam:    VS:  BP (!) 152/68   Pulse 70   Ht  (1.626 m)   Wt 161 lb 6.4 oz (73.2 kg)   LMP 03/29/1993 (Approximate)   BMI 27.70 kg/m     Wt Readings from Last 3 Encounters:  06/14/18 161 lb 6.4 oz (73.2 kg)  06/06/18 178 lb  (80.7 kg)  05/30/18 171 lb 9.6 oz (77.8 kg)     GEN: Well nourished, well developed in no acute distress HEENT: Normal NECK: No JVD; No carotid bruits LYMPHATICS: No lymphadenopathy CARDIAC: regular rhythm, normal S1 and S2, no murmurs, rubs, gallops. Radial and DP pulses 2+ bilaterally. RESPIRATORY:  Clear to auscultation without rales, wheezing or rhonchi  ABDOMEN: Soft, non-tender, non-distended. Well healed abdominal scar MUSCULOSKELETAL:  No edema; No deformity  SKIN: Warm and dry NEUROLOGIC:  Alert and oriented x 3 PSYCHIATRIC:  Normal affect   ASSESSMENT:    1. Essential hypertension   2. Paroxysmal atrial fibrillation (HCC)   3. Atrial flutter, paroxysmal (HCC)   4. History of AAA (abdominal aortic aneurysm) repair   5. Diastolic dysfunction   6. Pure hypercholesterolemia   7. Medication management   8. Transition of care performed with sharing of clinical summary   9. Chronic diastolic heart failure (HCC)   10. Encounter for education about heart failure    PLAN:    Hypertension: above goal today.  -with recent AAA repair, need BP much lower (~120/80 or less). Interestingly, she had hypotension while admitting, requiring her home medications to be held -she is currently only on furosemide 40 mg daily and metoprolol 50 mg BID -will restart lisinopril 20 mg daily. Expect this may need to be uptitrated -recheck kidney function, as she had mild AKI while in hospital -next medication is amlodipine -discussed how to check home BP properly. She will keep a log and let us know if it continues to rise -discussed red flag symptoms that need immediate medical attention -discussed that given her recent AAA repair and being on a blood thinner, it is very important that we get good control of her blood pressure  Paroxysmal atrial fib and flutter: CHA2DS2/VAS Stroke Risk Points=4  -in NSR today -continue apixaban 5 mg BID. Her concern is cost, has reached out to patient  assistance and case management for this. Declines coumadin. Would prefer DOAC if she can afford. -on metoprolol for rate control -likely prior tobacco history/lung disease exacerbated her post op arrhythmia  History of AAA rupture, with open repair of 8.3 cm infrarenal AAA: BP, lipid control as noted.  Chronic diastolic dysfunction: euvolemic appearing today -reviewed heart failure education, included instructions in her AVS -weighing herself with home health, she is now just below her prior weight and >40 lbs down from her hospital peak weight  Hypercholesterolemia: with PAD, LDL goal <70.  -last LDL 68 -continue pravastatin 20 mg, though ideally given her ASCVD would like a higher intensity statin. Will make gradual changes.    Cardiac risk counseling and prevention recommendations: -recommend heart healthy/Mediterranean diet, with whole grains, fruits, vegetable, fish, lean meats, nuts, and olive oil. Limit salt. -recommend moderate walking, 3-5 times/week for 30-50 minutes each session. Aim for at least 150 minutes.week. Goal should be pace of 3 miles/hours, or walking 1.5 miles in 30 minutes -recommend avoidance of tobacco products. Avoid excess alcohol.  Plan for follow up: 3 mos to monitor blood pressure.   Medication Adjustments/Labs and Tests Ordered: Current medicines are reviewed at length with the patient today.  Concerns regarding medicines are outlined above.  Orders Placed This Encounter  Procedures  . Basic metabolic panel  . EKG 12-Lead   Meds ordered this encounter  Medications  . DISCONTD: lisinopril (PRINIVIL,ZESTRIL) 20 MG tablet    Sig: Take 1 tablet (20 mg total) by mouth daily.    Dispense:  90 tablet    Refill:  3    Patient Instructions  Medication Instructions:  Start: Lisinopril 20 mg daily  If you need a refill on your cardiac medications before your next appointment, please call your pharmacy.   Lab work: Your physician recommends that you  return for lab work in 7-14 day ( BMP0  If you have labs (blood work) drawn today and your tests are completely normal, you will receive your results  only by: Marland Kitchen MyChart Message (if you have MyChart) OR . A paper copy in the mail If you have any lab test that is abnormal or we need to change your treatment, we will call you to review the results.  Testing/Procedures: None  Follow-Up: At [facility], you and your health needs are our priority.  As part of our continuing mission to provide you with exceptional heart care, we have created designated Provider Care Teams.  These Care Teams include your primary Cardiologist (physician) and Advanced Practice Providers (APPs -  Physician Assistants and Nurse Practitioners) who all work together to provide you with the care you need, when you need it. You will need a follow up appointment in 3 months.  Please call our office 2 months in advance to schedule this appointment.  You may see Dr. Cristal Deer or one of the following Advanced Practice Providers on your designated Care Team:   Theodore Demark, PA-C . Joni Reining, DNP, ANP  Any Other Special Instructions Will Be Listed Below (If Applicable).  For constipation: use miralax powder daily for softening. If you still have issues with constipation, use bisacodyl or senna for a squeezing medication.  Recommend getting home blood pressure cuff for measurement.  Do the following things EVERY DAY:  1) Weigh yourself EVERY morning after you go to the bathroom but before you eat or drink anything. Write this number down in a weight log/diary. If you gain 3 pounds overnight or 5 pounds in a week, call the office.  2) Take your medicines as prescribed. If you have concerns about your medications, please call us before you stop taking them.   3) Eat low salt foods-Limit salt (sodium) to 2000 mg per day. This will help prevent your body from holding onto fluid. Read food labels as many processed  foods have a lot of sodium, especially canned goods and prepackaged meats. If you would like some assistance choosing low sodium foods, we would be happy to set you up with a nutritionist.  4) Stay as active as you can everyday. Staying active will give you more energy and make your muscles stronger. Start with 5 minutes at a time and work your way up to 30 minutes a day. Break up your activities--do some in the morning and some in the afternoon. Start with 3 days per week and work your way up to 5 days as you can.  If you have chest pain, feel short of breath, dizzy, or lightheaded, STOP. If you don't feel better after a short rest, call 911. If you do feel better, call the office to let us know you have symptoms with exercise.  5) Limit all fluids for the day to less than 2 liters. Fluid includes all drinks, coffee, juice, ice chips, soup, jello, and all other liquids.     Signed, Jodelle Red, MD PhD 06/14/2018 11:19 AM    [facility]

## 2018-06-15 ENCOUNTER — Telehealth: Payer: Self-pay | Admitting: Internal Medicine

## 2018-06-15 ENCOUNTER — Telehealth: Payer: Self-pay

## 2018-06-15 DIAGNOSIS — L89312 Pressure ulcer of right buttock, stage 2: Secondary | ICD-10-CM | POA: Diagnosis not present

## 2018-06-15 DIAGNOSIS — I509 Heart failure, unspecified: Secondary | ICD-10-CM | POA: Diagnosis not present

## 2018-06-15 DIAGNOSIS — J449 Chronic obstructive pulmonary disease, unspecified: Secondary | ICD-10-CM | POA: Diagnosis not present

## 2018-06-15 DIAGNOSIS — Z48812 Encounter for surgical aftercare following surgery on the circulatory system: Secondary | ICD-10-CM | POA: Diagnosis not present

## 2018-06-15 DIAGNOSIS — Z4800 Encounter for change or removal of nonsurgical wound dressing: Secondary | ICD-10-CM | POA: Diagnosis not present

## 2018-06-15 DIAGNOSIS — I480 Paroxysmal atrial fibrillation: Secondary | ICD-10-CM | POA: Diagnosis not present

## 2018-06-15 DIAGNOSIS — I110 Hypertensive heart disease with heart failure: Secondary | ICD-10-CM | POA: Diagnosis not present

## 2018-06-15 NOTE — Telephone Encounter (Signed)
Copied from CRM [id number]. Topic: Quick Communication - Home Health Verbal Orders >> Jun 15, 2018 12:55 PM Lorrine Kin, NT wrote: Caller/Agency: [facility] Callback Number: [phone] (can leave a voicemail) Requesting OT/PT/Skilled Nursing/Social Work/Speech Therapy: Nursing Frequency:  1x a week for 2 weeks

## 2018-06-15 NOTE — Telephone Encounter (Signed)
Ok for verbals 

## 2018-06-15 NOTE — Telephone Encounter (Signed)
Faxed

## 2018-06-15 NOTE — Telephone Encounter (Signed)
-----   Message from Corwin Levins, MD sent at 06/14/2018  5:24 PM EDT ----- Ok to forward result to Dr Cedric Fishman and Vascular who asked for this testing

## 2018-06-16 ENCOUNTER — Telehealth: Payer: Self-pay | Admitting: Vascular Surgery

## 2018-06-16 ENCOUNTER — Encounter: Payer: Self-pay | Admitting: Internal Medicine

## 2018-06-16 MED ORDER — LEVALBUTEROL HCL 1.25 MG/0.5ML IN NEBU: 1.2500 mg | INHALATION_SOLUTION | Freq: Four times a day (QID) | RESPIRATORY_TRACT | 1 refills | Status: DC | PRN

## 2018-06-16 NOTE — Telephone Encounter (Signed)
I spoke with the patient by telephone.  She has an office visit to see me on 324.  Due to the corona virus I suggested that we delay this if she is doing well.  She reports that she is eating without difficulty and having normal bowel movements.  She reports that she walked approximately 1 mile yesterday.  I have encouraged her with these results.  We will see her again in approximately 2 months for continued follow-up.  She will call us if she has any difficulty

## 2018-06-16 NOTE — Telephone Encounter (Signed)
Spoke to [employer] and gave verbal okay per request.

## 2018-06-17 ENCOUNTER — Emergency Department (HOSPITAL_COMMUNITY)
Admission: EM | Admit: 2018-06-17 | Discharge: 2018-06-17 | Disposition: A | Discharge status: Home / Self Care | Payer: Medicare PPO | Attending: Emergency Medicine | Admitting: Emergency Medicine

## 2018-06-17 ENCOUNTER — Encounter (HOSPITAL_COMMUNITY): Payer: Self-pay | Admitting: Emergency Medicine

## 2018-06-17 ENCOUNTER — Other Ambulatory Visit: Payer: Self-pay

## 2018-06-17 DIAGNOSIS — R0689 Other abnormalities of breathing: Secondary | ICD-10-CM | POA: Diagnosis not present

## 2018-06-17 DIAGNOSIS — J449 Chronic obstructive pulmonary disease, unspecified: Secondary | ICD-10-CM | POA: Diagnosis not present

## 2018-06-17 DIAGNOSIS — F17200 Nicotine dependence, unspecified, uncomplicated: Secondary | ICD-10-CM | POA: Diagnosis not present

## 2018-06-17 DIAGNOSIS — I10 Essential (primary) hypertension: Secondary | ICD-10-CM | POA: Diagnosis not present

## 2018-06-17 LAB — COMPREHENSIVE METABOLIC PANEL WITH GFR
ALT: 14 U/L (ref 0–44)
AST: 15 U/L (ref 15–41)
Albumin: 3.1 g/dL — ABNORMAL LOW (ref 3.5–5.0)
Alkaline Phosphatase: 100 U/L (ref 38–126)
Anion gap: 8 (ref 5–15)
BUN: 14 mg/dL (ref 8–23)
CO2: 27 mmol/L (ref 22–32)
Calcium: 9.4 mg/dL (ref 8.9–10.3)
Chloride: 102 mmol/L (ref 98–111)
Creatinine, Ser: 1.03 mg/dL — ABNORMAL HIGH (ref 0.44–1.00)
GFR calc Af Amer: >60 mL/min
GFR calc non Af Amer: 55 mL/min — ABNORMAL LOW
Glucose, Bld: 107 mg/dL — ABNORMAL HIGH (ref 70–99)
Potassium: 3.3 mmol/L — ABNORMAL LOW (ref 3.5–5.1)
Sodium: 137 mmol/L (ref 135–145)
Total Bilirubin: 0.6 mg/dL (ref 0.3–1.2)
Total Protein: 7 g/dL (ref 6.5–8.1)

## 2018-06-17 LAB — CBC WITH DIFFERENTIAL/PLATELET
Abs Immature Granulocytes: 0.09 10*3/uL — ABNORMAL HIGH (ref 0.00–0.07)
Basophils Absolute: 0.1 10*3/uL (ref 0.0–0.1)
Basophils Relative: 1 %
Eosinophils Absolute: 0.2 10*3/uL (ref 0.0–0.5)
Eosinophils Relative: 2 %
HCT: 34.7 % — ABNORMAL LOW (ref 36.0–46.0)
Hemoglobin: 10.8 g/dL — ABNORMAL LOW (ref 12.0–15.0)
IMMATURE GRANULOCYTES: 1 %
Lymphocytes Relative: 26 %
Lymphs Abs: 2.2 10*3/uL (ref 0.7–4.0)
MCH: 29.3 pg (ref 26.0–34.0)
MCHC: 31.1 g/dL (ref 30.0–36.0)
MCV: 94.3 fL (ref 80.0–100.0)
Monocytes Absolute: 0.9 10*3/uL (ref 0.1–1.0)
Monocytes Relative: 11 %
NEUTROS PCT: 59 %
Neutro Abs: 4.9 10*3/uL (ref 1.7–7.7)
Platelets: 279 10*3/uL (ref 150–400)
RBC: 3.68 MIL/uL — ABNORMAL LOW (ref 3.87–5.11)
RDW: 15.9 % — AB (ref 11.5–15.5)
WBC: 8.4 10*3/uL (ref 4.0–10.5)
nRBC: 0 % (ref 0.0–0.2)

## 2018-06-17 LAB — BRAIN NATRIURETIC PEPTIDE: B Natriuretic Peptide: 340.2 pg/mL — ABNORMAL HIGH (ref 0.0–100.0)

## 2018-06-17 LAB — TROPONIN I: Troponin I: <0.03 ng/mL (ref <0.03)

## 2018-06-17 MED ORDER — AMLODIPINE BESYLATE 5 MG PO TABS
5.0000 mg | ORAL_TABLET | Freq: Once | ORAL | Status: AC
  Administered 2018-06-17: 5 mg via ORAL
  Filled 2018-06-17: qty 1

## 2018-06-17 MED ORDER — AMLODIPINE BESYLATE 5 MG PO TABS: 5.0000 mg | ORAL_TABLET | Freq: Every day | ORAL | 0 refills | Status: DC

## 2018-06-17 NOTE — ED Triage Notes (Signed)
Pt BIB [facility], states she was sent by her home health nurse due to BP being elevated all day. Recent surgery for AAA repair. Pt has no complaints at this time. EMS BP 201/90.

## 2018-06-17 NOTE — ED Provider Notes (Signed)
Emergency Department Provider Note   I have reviewed the triage vital signs and the nursing notes.   HISTORY  Chief Complaint Hypertension   HPI Leah Mata is a 73 y.o. female was discharged in the hospital few weeks ago after a long stay because of complications from a abdominal aortic aneurysm with rupture repair.  She had bouts of atrial fibrillation, hypertension, ileus and was discharged on metoprolol.  She but had progressively worsening hypertension so started lisinopril a few days ago and it continued to get higher so home health nurse told her to come to emergency room for evaluation.  Patient has no vision changes, neurologic changes, chest pain, back pain, abdominal pain, lower extremity swelling.  No changes in urination.  She is been taking medications as prescribed.  No anxiety. No other associated or modifying symptoms.    Past Medical History:  Diagnosis Date  . BACK PAIN 03/14/2009  . COPD 11/01/2006  . GLUCOSE INTOLERANCE 03/14/2009  . Headache(784.0) 03/14/2009  . HYPERTENSION 11/01/2006  . 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 INSOMNIA, HX OF 01/06/2007  . Other and unspecified hyperlipidemia 08/22/2013  . Urinary frequency 03/14/2009    Patient Active Problem List   Diagnosis Date Noted  . Diastolic dysfunction 06/06/2018  . Constipation 06/06/2018  . Anxiety 06/06/2018  . Pressure injury of skin 05/23/2018  . Hypoxemia   . Pulmonary infiltrates   . Atrial fibrillation (HCC)   . Acute respiratory failure (HCC)   . Ileus (HCC)   . AAA (abdominal aortic aneurysm) (HCC) 04/29/2018  . AAA (abdominal aortic aneurysm) without rupture (HCC) 04/28/2018  . Left arm pain 11/17/2017  . Skin lesion of face 09/07/2016  . Allergic rhinitis 09/07/2016  . Microhematuria 03/03/2016  . Hypercalcemia 08/12/2015  . Cough 12/19/2014  . Bilateral hearing loss 12/19/2014  . Hyperlipidemia 08/22/2013  . Smoker 12/01/2012  . COPD exacerbation (HCC) 02/24/2011   . 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 Pre-diabetes 08/19/2010  . Preventative health care 08/19/2010  . INSOMNIA-SLEEP DISORDER-UNSPEC 06/05/2009  . BACK PAIN 03/14/2009  . Essential hypertension 11/01/2006  . COPD (chronic obstructive pulmonary disease) (HCC) 11/01/2006    Past Surgical History:  Procedure Laterality Date  . ABDOMINAL AORTIC ANEURYSM REPAIR N/A 04/28/2018   Procedure: ANEURYSM ABDOMINAL AORTIC REPAIR WITH HEMASHIELD GOLD VASCULAR GRAFT 22X11 mm;  Surgeon: Larina EarthlyEarly, Todd F, MD;  [facility];  Service: Vascular;  Laterality: N/A;  . CARDIAC CATHETERIZATION  2003   negative  . DILATION AND CURETTAGE OF UTERUS    . TONSILLECTOMY    . TUBAL LIGATION      Current Outpatient Rx  . Order #: [id number] Class: Normal  . Order #: [id number] Class: Normal  . Order #: [id number] Class: Normal  . Order #: [id number] Class: Normal  . Order #: [id number] Class: Normal  . Order #: [id number] Class: Normal  . Order #: [id number] Class: Normal  . Order #: [id number] Class: Normal  . Order #: [id number] Class: Normal  . Order #: [id number] Class: Normal  . Order #: [id number] Class: Normal  . Order #: [id number] Class: Normal  . Order #: [id number] Class: Print    Allergies Hydrochlorothiazide  Family History  Problem Relation Age of Onset  . Coronary artery disease Other        female 1st degree relative <60 and female 1st degree relative <50  . Diabetes Father   . Heart attack Father   . Diabetes Mother   . Hypertension Mother   . Congestive Heart Failure Mother   . Colon cancer Neg  Hx   . Breast cancer Neg Hx     Social History Social History   Tobacco Use  . Smoking status: Current Every Day Smoker  . Smokeless tobacco: Never Used  . Tobacco comment: 2 packs per week  Substance Use Topics  . Alcohol use: No  . Drug use: No    Review of Systems  All other systems negative except as documented in the HPI. All pertinent positives and negatives as reviewed in the HPI.  ____________________________________________   PHYSICAL EXAM:  VITAL SIGNS: ED Triage Vitals  Enc Vitals Group     BP 06/17/18 0117 (!) 207/81     Pulse Rate 06/17/18 0117 69     Resp 06/17/18 0117 17     Temp 06/17/18 0117 99 F (37.2 C)     Temp Source 06/17/18 0117 Oral     SpO2 06/17/18 0117 96 %     Weight 06/17/18 0117 157 lb (71.2 kg)     Height 06/17/18 0117 5' (1.524 m)    Constitutional: Alert and oriented. Well appearing and in no acute distress. Eyes: Conjunctivae are normal. PERRL. EOMI. Head: Atraumatic. Nose: No congestion/rhinnorhea. Mouth/Throat: Mucous membranes are moist.  Oropharynx non-erythematous. Neck: No stridor.  No meningeal signs.   Cardiovascular: Normal rate, regular rhythm. Good peripheral circulation. Grossly normal heart sounds.   Respiratory: Normal respiratory effort.  No retractions. Lungs CTAB. Gastrointestinal: Soft and nontender. No distention.  Musculoskeletal: No lower extremity tenderness nor edema. No gross deformities of extremities. Neurologic:  Normal speech and language. No gross focal neurologic deficits are appreciated.  Skin:  Skin is warm, dry and intact. No rash noted.   ____________________________________________   LABS (all labs ordered are listed, but only abnormal results are displayed)  Labs Reviewed  CBC WITH DIFFERENTIAL/PLATELET - Abnormal; Notable for the following components:      Result Value   RBC 3.68 (*)    Hemoglobin 10.8 (*)    HCT 34.7 (*)    RDW 15.9 (*)    Abs Immature Granulocytes 0.09 (*)    All other components within normal limits  COMPREHENSIVE METABOLIC PANEL - Abnormal; Notable for the following components:   Potassium 3.3 (*)    Glucose, Bld 107 (*)    Creatinine, Ser 1.03 (*)    Albumin 3.1 (*)    GFR calc non Af Amer 55 (*)    All other components within normal limits  BRAIN NATRIURETIC PEPTIDE - Abnormal; Notable for the following components:   B Natriuretic Peptide 340.2 (*)     All other components within normal limits  TROPONIN I   ____________________________________________  EKG   EKG Interpretation  Date/Time:  Saturday June 17 2018 01:57:55 EDT Ventricular Rate:  66 PR Interval:    QRS Duration: 82 QT Interval:  433 QTC Calculation: 454 R Axis:   56 Text Interpretation:  Sinus rhythm Abnormal R-wave progression, early transition Baseline wander in lead(s) V6 improved from Afib previously Confirmed by Jonanthan Bolender, Barbara Cower [phone]) on 06/17/2018 3:27:43 AM       ____________________________________________  RADIOLOGY  No results found.  ____________________________________________   PROCEDURES  Procedure(s) performed:   Procedures   ____________________________________________   INITIAL IMPRESSION / ASSESSMENT AND PLAN / ED COURSE  Asymptomatic HTN. Normal labs. However, with recent AAA repair, discussed on phone with Dr. Myra Gianotti who was ok with starting norvasc but stated if kidney function was normal, labs otherwise normal she could be dc with previous follow up.   BP improved  with Norvasc. No other issues. Stable for discharge.    Pertinent labs & imaging results that were available during my care of the patient were reviewed by me and considered in my medical decision making (see chart for details).  ____________________________________________  FINAL CLINICAL IMPRESSION(S) / ED DIAGNOSES  Final diagnoses:  Hypertension, unspecified type     MEDICATIONS GIVEN DURING THIS VISIT:  Medications  amLODipine (NORVASC) tablet 5 mg (5 mg Oral Given 06/17/18 0256)     NEW OUTPATIENT MEDICATIONS STARTED DURING THIS VISIT:  Discharge Medication List as of 06/17/2018  3:43 AM    START taking these medications   Details  amLODipine (NORVASC) 5 MG tablet Take 1 tablet (5 mg total) by mouth daily., Starting Sat 06/17/2018, Print        Note:  This note was prepared with assistance of Dragon voice recognition software. Occasional  wrong-word or sound-a-like substitutions may have occurred due to the inherent limitations of voice recognition software.   Nila Winker, Barbara Cower, MD 06/17/18 [phone]

## 2018-06-19 ENCOUNTER — Encounter: Payer: Self-pay | Admitting: Internal Medicine

## 2018-06-19 MED ORDER — METOPROLOL TARTRATE 50 MG PO TABS: 50.0000 mg | ORAL_TABLET | Freq: Two times a day (BID) | ORAL | 2 refills | Status: DC

## 2018-06-19 MED ORDER — FUROSEMIDE 40 MG PO TABS: 40.0000 mg | ORAL_TABLET | Freq: Every day | ORAL | 3 refills | Status: DC

## 2018-06-19 MED ORDER — AMLODIPINE BESYLATE 5 MG PO TABS: 5.0000 mg | ORAL_TABLET | Freq: Every day | ORAL | 3 refills | Status: DC

## 2018-06-19 MED ORDER — PRAVASTATIN SODIUM 20 MG PO TABS: 20.0000 mg | ORAL_TABLET | Freq: Every day | ORAL | 3 refills | Status: DC

## 2018-06-19 MED ORDER — LISINOPRIL 20 MG PO TABS: 20.0000 mg | ORAL_TABLET | Freq: Every day | ORAL | 3 refills | Status: DC

## 2018-06-20 ENCOUNTER — Encounter: Payer: Self-pay | Admitting: Cardiology

## 2018-06-20 ENCOUNTER — Encounter: Payer: Medicare PPO | Admitting: Vascular Surgery

## 2018-06-20 DIAGNOSIS — I4892 Unspecified atrial flutter: Secondary | ICD-10-CM | POA: Insufficient documentation

## 2018-06-20 DIAGNOSIS — Z4800 Encounter for change or removal of nonsurgical wound dressing: Secondary | ICD-10-CM | POA: Diagnosis not present

## 2018-06-20 DIAGNOSIS — I509 Heart failure, unspecified: Secondary | ICD-10-CM | POA: Diagnosis not present

## 2018-06-20 DIAGNOSIS — I480 Paroxysmal atrial fibrillation: Secondary | ICD-10-CM | POA: Diagnosis not present

## 2018-06-20 DIAGNOSIS — Z9889 Other specified postprocedural states: Secondary | ICD-10-CM | POA: Insufficient documentation

## 2018-06-20 DIAGNOSIS — J449 Chronic obstructive pulmonary disease, unspecified: Secondary | ICD-10-CM | POA: Diagnosis not present

## 2018-06-20 DIAGNOSIS — L89312 Pressure ulcer of right buttock, stage 2: Secondary | ICD-10-CM | POA: Diagnosis not present

## 2018-06-20 DIAGNOSIS — I110 Hypertensive heart disease with heart failure: Secondary | ICD-10-CM | POA: Diagnosis not present

## 2018-06-20 DIAGNOSIS — Z48812 Encounter for surgical aftercare following surgery on the circulatory system: Secondary | ICD-10-CM | POA: Diagnosis not present

## 2018-06-27 ENCOUNTER — Telehealth: Payer: Self-pay | Admitting: Internal Medicine

## 2018-06-27 ENCOUNTER — Other Ambulatory Visit: Payer: Self-pay

## 2018-06-27 DIAGNOSIS — J449 Chronic obstructive pulmonary disease, unspecified: Secondary | ICD-10-CM | POA: Diagnosis not present

## 2018-06-27 DIAGNOSIS — Z4800 Encounter for change or removal of nonsurgical wound dressing: Secondary | ICD-10-CM | POA: Diagnosis not present

## 2018-06-27 DIAGNOSIS — I480 Paroxysmal atrial fibrillation: Secondary | ICD-10-CM | POA: Diagnosis not present

## 2018-06-27 DIAGNOSIS — I110 Hypertensive heart disease with heart failure: Secondary | ICD-10-CM | POA: Diagnosis not present

## 2018-06-27 DIAGNOSIS — I509 Heart failure, unspecified: Secondary | ICD-10-CM | POA: Diagnosis not present

## 2018-06-27 DIAGNOSIS — L89312 Pressure ulcer of right buttock, stage 2: Secondary | ICD-10-CM | POA: Diagnosis not present

## 2018-06-27 DIAGNOSIS — Z48812 Encounter for surgical aftercare following surgery on the circulatory system: Secondary | ICD-10-CM | POA: Diagnosis not present

## 2018-06-27 MED ORDER — FUROSEMIDE 40 MG PO TABS: 40.0000 mg | ORAL_TABLET | Freq: Every day | ORAL | 0 refills | Status: DC

## 2018-06-27 MED ORDER — POTASSIUM CHLORIDE ER 10 MEQ PO TBCR: 20.0000 meq | EXTENDED_RELEASE_TABLET | Freq: Every day | ORAL | 2 refills | Status: DC

## 2018-06-27 MED ORDER — POTASSIUM CHLORIDE ER 10 MEQ PO TBCR: 20.0000 meq | EXTENDED_RELEASE_TABLET | Freq: Every day | ORAL | 0 refills | Status: DC

## 2018-06-27 NOTE — Telephone Encounter (Signed)
Copied from CRM [id number]. Topic: Quick Communication - See Telephone Encounter >> Jun 27, 2018 11:39 AM Aretta Nip wrote: CRM for notification. See Telephone encounter for: 06/27/18. Beth from [facility] called saying that this is the last time she is sch to see pt. She wants to know 1. Does Dr Jonny Ruiz want the pt to continue the furosemide (LASIX) 40 MG tablet for her swelling. 2nd thepotassium chloride (K-DUR) 10 MEQ tablet was called in but the pt only has 1 more pill and it was all called in to Mail order. Could Dr Jonny Ruiz call in a several more just to bridge till she receives her order/ send the several to [id number] - [place], [name] - [address] [phone] (Phone) [phone] (Fax)  Please remember to call Waynetta Sandy) back at [facility] for clarification on the lasix and she will be glad relay the message about the Potassium to the pt. Thanks !call back [phone]

## 2018-06-27 NOTE — Addendum Note (Signed)
Addended by: Corwin Levins on: 06/27/2018 03:49 PM   Modules accepted: Orders

## 2018-06-27 NOTE — Telephone Encounter (Signed)
Ok, this is done 

## 2018-06-28 ENCOUNTER — Other Ambulatory Visit: Payer: Self-pay | Admitting: Internal Medicine

## 2018-06-29 ENCOUNTER — Ambulatory Visit: Payer: Self-pay | Admitting: Internal Medicine

## 2018-06-29 MED ORDER — AMLODIPINE BESYLATE 10 MG PO TABS: 10.0000 mg | ORAL_TABLET | Freq: Every day | ORAL | 3 refills | Status: DC

## 2018-06-29 NOTE — Telephone Encounter (Signed)
Ok for increase amlodipine to 10 mg per day

## 2018-06-29 NOTE — Addendum Note (Signed)
Addended by: Corwin Levins on: 06/29/2018 10:44 AM   Modules accepted: Orders

## 2018-06-29 NOTE — Telephone Encounter (Signed)
Pt. Reports BP has been elevated this week. Denies any symptoms. No missed doses of medication. This morning BP 176/83.  Last night BP  166/82 .Yesterday morning 170/77. Sunday  BP  142/78. Pt. Concerned because of recent abdominal aorta repair. Spoke with Brittany at the practice. Please advise pt.  Answer Assessment - Initial Assessment Questions 1. BLOOD PRESSURE: "What is the blood pressure?" "Did you take at least two measurements 5 minutes apart?"     176/83 2. ONSET: "When did you take your blood pressure?"     08 00 3. HOW: "How did you obtain the blood pressure?" (e.g., visiting nurse, automatic home BP monitor)     Home monitor 4. HISTORY: "Do you have a history of high blood pressure?"     Yes 5. MEDICATIONS: "Are you taking any medications for blood pressure?" "Have you missed any doses recently?"     No missed doses 6. OTHER SYMPTOMS: "Do you have any symptoms?" (e.g., headache, chest pain, blurred vision, difficulty breathing, weakness)     No symptoms 7. PREGNANCY: "Is there any chance you are pregnant?" "When was your last menstrual period?"     No  Protocols used: HIGH BLOOD PRESSURE-A-AH

## 2018-06-29 NOTE — Telephone Encounter (Signed)
Pt has been informed of medication change. I also told the patient to continue to log her BP's so we can see how the increase works for her.

## 2018-06-30 DIAGNOSIS — J449 Chronic obstructive pulmonary disease, unspecified: Secondary | ICD-10-CM | POA: Diagnosis not present

## 2018-07-11 ENCOUNTER — Telehealth: Payer: Self-pay | Admitting: Cardiology

## 2018-07-11 ENCOUNTER — Telehealth: Payer: Self-pay | Admitting: Internal Medicine

## 2018-07-11 DIAGNOSIS — Z79899 Other long term (current) drug therapy: Secondary | ICD-10-CM | POA: Diagnosis not present

## 2018-07-11 NOTE — Telephone Encounter (Signed)
We should not really rx xanax as she is already taking temazpam at bedtime prn

## 2018-07-11 NOTE — Telephone Encounter (Signed)
Pt aware 1 week worth of Eliquis samples left at front desk for pick up ./cy

## 2018-07-11 NOTE — Telephone Encounter (Signed)
Same answer, we do not treat with 2 different benzodiazipine at the same time, as this is not good medical practice

## 2018-07-11 NOTE — Telephone Encounter (Signed)
Patient calling the office for samples of medication:   1.  What medication and dosage are you requesting samples for? apixaban (ELIQUIS) 5 MG TABS tablet  2.  Are you currently out of this medication? No    

## 2018-07-11 NOTE — Telephone Encounter (Signed)
Patient called and stated that she needs a medication for her nerves. She is requesting low dose xanax be sent in, she states that her current "nerve pill" makes her sick and she can not take it. Patient is not aware of the name. Call patient at [phone] (mobile)

## 2018-07-11 NOTE — Telephone Encounter (Signed)
She stated that the Buspar makes her sick. She stated that she only asked for a low dose zanax because they give those to her when she is in the hospital and it helps her. She stated that she does not want a high does but would like the low dose to help her since the buspar makes her sick. I informed the patient that I would take this additional information back to PCP and follow up with her once I have an update.

## 2018-07-12 LAB — BASIC METABOLIC PANEL
BUN/Creatinine Ratio: 17 (ref 12–28)
BUN: 17 mg/dL (ref 8–27)
CO2: 23 mmol/L (ref 20–29)
Calcium: 10.2 mg/dL (ref 8.7–10.3)
Chloride: 100 mmol/L (ref 96–106)
Creatinine, Ser: 1.02 mg/dL — ABNORMAL HIGH (ref 0.57–1.00)
GFR calc Af Amer: 64 mL/min/{1.73_m2} (ref >59)
GFR calc non Af Amer: 55 mL/min/{1.73_m2} — ABNORMAL LOW (ref >59)
Glucose: 111 mg/dL — ABNORMAL HIGH (ref 65–99)
Potassium: 4.5 mmol/L (ref 3.5–5.2)
Sodium: 138 mmol/L (ref 134–144)

## 2018-07-14 ENCOUNTER — Telehealth: Payer: Self-pay

## 2018-07-14 NOTE — Telephone Encounter (Signed)
-----   Message from Jodelle Red, MD sent at 07/14/2018 10:44 AM EDT ----- Kidney function stable on current medications, ok to continue.

## 2018-07-14 NOTE — Telephone Encounter (Signed)
Pt updated with lab results and voiced understanding, but report her BP is still running high. BP readings listed below.  4/17-149/66 4/16-146/70 4/15-165/77 4/14-151/72 4/13-162/76  Will route to MD for review.

## 2018-07-17 NOTE — Telephone Encounter (Signed)
Can we set her up for a virtual visit Wed AM? Her BP is still higher than I'd like. Thanks!

## 2018-07-17 NOTE — Telephone Encounter (Signed)
Appointment scheduled for 4/22 at 9 am.  Virtual Visit Pre-Appointment Phone Call  Steps For Call:  1. Confirm consent - "In the setting of the current Covid19 crisis, you are scheduled for a (phone or video) visit with your provider on (date) at (time).  Just as we do with many in-office visits, in order for you to participate in this visit, we must obtain consent.  If you'd like, I can send this to your mychart (if signed up) or email for you to review.  Otherwise, I can obtain your verbal consent now.  All virtual visits are billed to your insurance company just like a normal visit would be.  By agreeing to a virtual visit, we'd like you to understand that the technology does not allow for your provider to perform an examination, and thus may limit your provider's ability to fully assess your condition. If your provider identifies any concerns that need to be evaluated in person, we will make arrangements to do so.  Finally, though the technology is pretty good, we cannot assure that it will always work on either your or our end, and in the setting of a video visit, we may have to convert it to a phone-only visit.  In either situation, we cannot ensure that we have a secure connection.  Are you willing to proceed?" STAFF: Did the patient verbally acknowledge consent to telehealth visit? Document YES/NO here: YES  2. Confirm the BEST phone number to call the day of the visit by including in appointment notes  3. Give patient instructions for WebEx/MyChart download to smartphone as below or Doximity/Doxy.me if video visit (depending on what platform provider is using)  4. Advise patient to be prepared with their blood pressure, heart rate, weight, any heart rhythm information, their current medicines, and a piece of paper and pen handy for any instructions they may receive the day of their visit  5. Inform patient they will receive a phone call 15 minutes prior to their appointment time (may be  from unknown caller ID) so they should be prepared to answer  6. Confirm that appointment type is correct in Epic appointment notes (VIDEO vs PHONE)     TELEPHONE CALL NOTE  Leah Mata has been deemed a candidate for a follow-up tele-health visit to limit community exposure during the Covid-19 pandemic. I spoke with the patient via phone to ensure availability of phone/video source, confirm preferred email & phone number, and discuss instructions and expectations.  I reminded Leah Mata to be prepared with any vital sign and/or heart rhythm information that could potentially be obtained via home monitoring, at the time of her visit. I reminded Leah Mata to expect a phone call at the time of her visit if her visit.  Parke Poisson, RN 07/17/2018 10:20 AM   INSTRUCTIONS FOR DOWNLOADING THE WEBEX APP TO SMARTPHONE  - If Apple, ask patient to go to App Store and type in WebEx in the search bar. Download [employer], the blue/green circle. If Android, go to [employer] and type in [employer] in the search bar. The app is free but as with any other app downloads, their phone may require them to verify saved payment information or Apple/Android password.  - The patient does NOT have to create an account. - On the day of the visit, the assist will walk the patient through joining the meeting with the meeting number/password.  INSTRUCTIONS FOR DOWNLOADING THE MYCHART APP TO  SMARTPHONE  - The patient must first make sure to have activated MyChart and know their login information - If Apple, go to [employer] Store and type in MyChart in the search bar and download the app. If Android, ask patient to go to [employer] Play Store and type in [facility] in the search bar and download the app. The app is free but as with any other app downloads, their phone may require them to verify saved payment information or Apple/Android password.  - The patient will need to then log into the app with their  MyChart username and password, and select [facility] as their healthcare provider to link the account. When it is time for your visit, go to the MyChart app, find appointments, and click Begin Video Visit. Be sure to Select Allow for your device to access the Microphone and Camera for your visit. You will then be connected, and your provider will be with you shortly.  **If they have any issues connecting, or need assistance please contact MyChart service desk [phone])**  **If using a computer, in order to ensure the best quality for their visit they will need to use either of the following Internet Browsers: [employer] Edge, or Google Chrome**  IF USING DOXIMITY or DOXY.ME - The patient will receive a link just prior to their visit, either by text or email (to be determined day of appointment depending on if it's doxy.me or Doximity).     FULL LENGTH CONSENT FOR TELE-HEALTH VISIT   I hereby voluntarily request, consent and authorize [facility] and its employed or contracted physicians, physician assistants, nurse practitioners or other licensed health care professionals (the Practitioner), to provide me with telemedicine health care services (the "Services") as deemed necessary by the treating Practitioner. I acknowledge and consent to receive the Services by the Practitioner via telemedicine. I understand that the telemedicine visit will involve communicating with the Practitioner through live audiovisual communication technology and the disclosure of certain medical information by electronic transmission. I acknowledge that I have been given the opportunity to request an in-person assessment or other available alternative prior to the telemedicine visit and am voluntarily participating in the telemedicine visit.  I understand that I have the right to withhold or withdraw my consent to the use of telemedicine in the course of my care at any time, without affecting my right to  future care or treatment, and that the Practitioner or I may terminate the telemedicine visit at any time. I understand that I have the right to inspect all information obtained and/or recorded in the course of the telemedicine visit and may receive copies of available information for a reasonable fee.  I understand that some of the potential risks of receiving the Services via telemedicine include:  Marland Kitchen. Delay or interruption in medical evaluation due to technological equipment failure or disruption; . Information transmitted may not be sufficient (e.g. poor resolution of images) to allow for appropriate medical decision making by the Practitioner; and/or  . In rare instances, security protocols could fail, causing a breach of personal health information.  Furthermore, I acknowledge that it is my responsibility to provide information about my medical history, conditions and care that is complete and accurate to the best of my ability. I acknowledge that Practitioner's advice, recommendations, and/or decision may be based on factors not within their control, such as incomplete or inaccurate data provided by me or distortions of diagnostic images or specimens that may result from electronic transmissions. I understand that  the practice of medicine is not an exact science and that Practitioner makes no warranties or guarantees regarding treatment outcomes. I acknowledge that I will receive a copy of this consent concurrently upon execution via email to the email address I last provided but may also request a printed copy by calling the office of [facility].    I understand that my insurance will be billed for this visit.   I have read or had this consent read to me. . I understand the contents of this consent, which adequately explains the benefits and risks of the Services being provided via telemedicine.  . I have been provided ample opportunity to ask questions regarding this consent and the Services  and have had my questions answered to my satisfaction. . I give my informed consent for the services to be provided through the use of telemedicine in my medical care  By participating in this telemedicine visit I agree to the above.

## 2018-07-18 ENCOUNTER — Telehealth: Payer: Self-pay | Admitting: Cardiology

## 2018-07-18 NOTE — Telephone Encounter (Signed)
Per scheduler, pt called to request samples of Eliquis as she is waiting on patient assistance. Informed pt that unfortunately we are currently out of samples. Pt voiced understanding.

## 2018-07-18 NOTE — Telephone Encounter (Signed)
Home phone/ my chart/ virtual consent/ pre reg completed °

## 2018-07-19 ENCOUNTER — Encounter: Payer: Self-pay | Admitting: Cardiology

## 2018-07-19 ENCOUNTER — Telehealth (INDEPENDENT_AMBULATORY_CARE_PROVIDER_SITE_OTHER): Payer: Medicare PPO | Admitting: Cardiology

## 2018-07-19 VITALS — BP 140/79 | HR 58 | Ht 60.0 in | Wt 150.8 lb

## 2018-07-19 DIAGNOSIS — E7800 Pure hypercholesterolemia, unspecified: Secondary | ICD-10-CM | POA: Diagnosis not present

## 2018-07-19 DIAGNOSIS — Z79899 Other long term (current) drug therapy: Secondary | ICD-10-CM

## 2018-07-19 DIAGNOSIS — I4892 Unspecified atrial flutter: Secondary | ICD-10-CM | POA: Diagnosis not present

## 2018-07-19 DIAGNOSIS — I480 Paroxysmal atrial fibrillation: Secondary | ICD-10-CM | POA: Diagnosis not present

## 2018-07-19 DIAGNOSIS — Z9889 Other specified postprocedural states: Secondary | ICD-10-CM | POA: Diagnosis not present

## 2018-07-19 DIAGNOSIS — Z7189 Other specified counseling: Secondary | ICD-10-CM | POA: Diagnosis not present

## 2018-07-19 DIAGNOSIS — I5189 Other ill-defined heart diseases: Secondary | ICD-10-CM | POA: Diagnosis not present

## 2018-07-19 DIAGNOSIS — I10 Essential (primary) hypertension: Secondary | ICD-10-CM

## 2018-07-19 MED ORDER — LISINOPRIL 20 MG PO TABS: 20.0000 mg | ORAL_TABLET | Freq: Two times a day (BID) | ORAL | 3 refills | Status: DC

## 2018-07-19 NOTE — Progress Notes (Signed)
Virtual Visit via Telephone Note   This visit type was conducted due to national recommendations for restrictions regarding the COVID-19 Pandemic (e.g. social distancing) in an effort to limit this patient's exposure and mitigate transmission in our community.  Due to her co-morbid illnesses, this patient is at least at moderate risk for complications without adequate follow up.  This format is felt to be most appropriate for this patient at this time.  The patient did not have access to video technology/had technical difficulties with video requiring transitioning to audio format only (telephone).  All issues noted in this document were discussed and addressed.  No physical exam could be performed with this format.  Please refer to the patient's chart for her  consent to telehealth for [facility].   Evaluation Performed:  Follow-up visit  Date:  07/19/2018   ID:  Leah Mata, DOB 12/03/1945, MRN [id number]  Patient Location: Home Provider Location: Home  PCP:  Corwin LevinsJohn, James W, MD  Cardiologist:  Jodelle RedBridgette Calbert Hulsebus, MD  Electrophysiologist:  None   Chief Complaint:  Follow up of blood pressure  History of Present Illness:    Leah Mata is a 73 y.o. female with a hx of hypertension, hyperlipidemia, AAA with ruptured abdominal aortic aneurysm with surgery on 04/29/2018. She had a >30 day admission, with discharge on 05/30/18  The patient does not have symptoms concerning for COVID-19 infection (fever, chills, cough, or new shortness of breath).   Blood pressures are ranging above goal, though improved from when they were ranging >200 systolic in the hospital.  4/17-149/66 4/16-146/70 4/15-165/77 4/14-151/72 4/13-162/76  Weight staying the same around 150 lbs, no swelling. Denies chest dullness, though does have an area of tenderness under left breast that is tender to palpation that started yesterday after washing windows. No shortness of breath at rest or with normal exertion.  No PND, orthopnea, LE edema or unexpected weight gain. No syncope or palpitations.  No longer taking furosemide/potassium. Not taking Buspar as it makes her very nauseated, dizzy, and drunk feeling. Asked me to take off her list.  Working on patient assistance for help with DOAC. Reviewed indications for this, why clopidogrel (which her husband had been on) will not work in the same way.  Past Medical History:  Diagnosis Date  . BACK PAIN 03/14/2009  . COPD 11/01/2006  . GLUCOSE INTOLERANCE 03/14/2009  . Headache(784.0) 03/14/2009  . HYPERTENSION 11/01/2006  . 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 INSOMNIA, HX OF 01/06/2007  . Other and unspecified hyperlipidemia 08/22/2013  . Urinary frequency 03/14/2009   Past Surgical History:  Procedure Laterality Date  . ABDOMINAL AORTIC ANEURYSM REPAIR N/A 04/28/2018   Procedure: ANEURYSM ABDOMINAL AORTIC REPAIR WITH HEMASHIELD GOLD VASCULAR GRAFT 22X11 mm;  Surgeon: Larina EarthlyEarly, Todd F, MD;  [facility];  Service: Vascular;  Laterality: N/A;  . CARDIAC CATHETERIZATION  2003   negative  . DILATION AND CURETTAGE OF UTERUS    . TONSILLECTOMY    . TUBAL LIGATION       Current Meds  Medication Sig  . albuterol (VENTOLIN HFA) 108 (90 Base) MCG/ACT inhaler Inhale 2 puffs into the lungs every 6 (six) hours as needed for wheezing or shortness of breath.  Marland Kitchen. amLODipine (NORVASC) 10 MG tablet Take 1 tablet (10 mg total) by mouth daily.  Marland Kitchen. apixaban (ELIQUIS) 5 MG TABS tablet Take 1 tablet (5 mg total) by mouth 2 (two) times daily.  . fluticasone (FLONASE) 50 MCG/ACT nasal spray Place 2  sprays into both nostrils daily.  Marland Kitchen levalbuterol (XOPENEX) 1.25 MG/0.5ML nebulizer solution Take 1.25 mg by nebulization every 6 (six) hours as needed for wheezing or shortness of breath.  . lisinopril (ZESTRIL) 20 MG tablet Take 1 tablet (20 mg total) by mouth 2 (two) times a day.  . metoprolol tartrate (LOPRESSOR) 50 MG tablet Take 1 tablet (50 mg total) by mouth 2 (two)  times daily.  . pravastatin (PRAVACHOL) 20 MG tablet Take 1 tablet (20 mg total) by mouth daily.  . promethazine-dextromethorphan (PROMETHAZINE-DM) 6.25-15 MG/5ML syrup Take 5 mLs by mouth 4 (four) times daily as needed for cough.  . temazepam (RESTORIL) 15 MG capsule Take 1 capsule (15 mg total) by mouth at bedtime as needed for sleep.  . [DISCONTINUED] busPIRone (BUSPAR) 10 MG tablet TAKE 1 TABLET BY MOUTH THREE TIMES A DAY AS NEEDED  . [DISCONTINUED] furosemide (LASIX) 40 MG tablet Take 1 tablet (40 mg total) by mouth daily for 30 days.  . [DISCONTINUED] lisinopril (PRINIVIL,ZESTRIL) 20 MG tablet Take 1 tablet (20 mg total) by mouth daily.  . [DISCONTINUED] potassium chloride (K-DUR) 10 MEQ tablet Take 2 tablets (20 mEq total) by mouth daily.     Allergies:   Hydrochlorothiazide   Social History   Tobacco Use  . Smoking status: Former Smoker    Types: Cigarettes  . Smokeless tobacco: Never Used  . Tobacco comment: 2 packs per week  Substance Use Topics  . Alcohol use: No  . Drug use: No     Family Hx: The patient's family history includes Congestive Heart Failure in her mother; Coronary artery disease in an other family member; Diabetes in her father and mother; Heart attack in her father; Hypertension in her mother. There is no history of Colon cancer or Breast cancer.  ROS:   Please see the history of present illness.    All other systems reviewed and are negative.   Prior CV studies:   The following studies were reviewed today: Echo 04/2018  Labs/Other Tests and Data Reviewed:    EKG:  An ECG dated 06/17/18 was personally reviewed today and demonstrated:  NSR  Recent Labs: 05/15/2018: Magnesium 1.8 05/20/2018: TSH 1.723 06/17/2018: ALT 14; B Natriuretic Peptide 340.2; Hemoglobin 10.8; Platelets 279 07/11/2018: BUN 17; Creatinine, Ser 1.02; Potassium 4.5; Sodium 138   Recent Lipid Panel Lab Results  Component Value Date/Time   CHOL 136 05/09/2018 02:42 AM   TRIG 131  05/15/2018 06:57 AM   HDL 19 (L) 05/09/2018 02:42 AM   CHOLHDL 7.2 05/09/2018 02:42 AM   LDLCALC 68 05/09/2018 02:42 AM   LDLDIRECT 84.1 01/31/2014 11:43 AM    Wt Readings from Last 3 Encounters:  07/19/18 150 lb 12.8 oz (68.4 kg)  06/17/18 157 lb (71.2 kg)  06/14/18 161 lb 6.4 oz (73.2 kg)     Objective:    Vital Signs:  BP 140/79 Comment: earlier this morning was 146/77  Pulse (!) 58   Ht 5' (1.524 m)   Wt 150 lb 12.8 oz (68.4 kg)   LMP 03/29/1993 (Approximate)   BMI 29.45 kg/m     ASSESSMENT & PLAN:    Hypertension: above goal today, but not having spikes to >200 systolic any more.  -with recent AAA repair, need BP much lower (~120/80).  -continue amlodipine 10 mg daily -will increase lisinopril from 20 mg daily to 20 mg BID (patient preference for BID, ok by me, want 40 mg total daily dose) -recheck BMET in 10-14 days -currently on  metoprolol 50 mg BID. Next change will be to change this to carvedilol -discussed red flag symptoms that need immediate medical attention  Paroxysmal atrial fib and flutter: CHA2DS2/VAS Stroke Risk Points=4  -in NSR on most recent ECG, denies palpitations or tachycardia -continue apixaban 5 mg BID. Her concern is cost, has reached out to patient assistance and case management for this. Declines coumadin. Would prefer DOAC if she can afford. -on metoprolol for rate control -likely prior tobacco history/lung disease exacerbated her post op arrhythmia  History of AAA rupture, with open repair of 8.3 cm infrarenal AAA: BP, lipid control as noted.  Chronic diastolic dysfunction: now euvolemic, weight stable off furosemide/potassium  Hypercholesterolemia: with PAD, LDL goal <70.  -last LDL 68 -continue pravastatin 20 mg, though ideally given her ASCVD would like a higher intensity statin. Will make gradual changes.    Cardiac risk counseling and prevention recommendations: -recommend heart healthy/Mediterranean diet, with whole grains,  fruits, vegetable, fish, lean meats, nuts, and olive oil. Limit salt. -recommend moderate walking, 3-5 times/week for 30-50 minutes each session. Aim for at least 150 minutes.week. Goal should be pace of 3 miles/hours, or walking 1.5 miles in 30 minutes -recommend avoidance of tobacco products. Avoid excess alcohol.  COVID-19 Education: The signs and symptoms of COVID-19 were discussed with the patient and how to seek care for testing (follow up with PCP or arrange E-visit).  The importance of social distancing was discussed today.  Time:   Today, I have spent 30 minutes with the patient with telehealth technology discussing the above problems.     Medication Adjustments/Labs and Tests Ordered: Current medicines are reviewed at length with the patient today.  Concerns regarding medicines are outlined above.   Patient Instructions  Medication Instructions:  Increase: Lisinopril 20 mg two times a day If you need a refill on your cardiac medications before your next appointment, please call your pharmacy.   Lab work: Your physician recommends that you return for lab work in 10-14 days (BMP)  If you have labs (blood work) drawn today and your tests are completely normal, you will receive your results only by: Marland Kitchen MyChart Message (if you have MyChart) OR . A paper copy in the mail If you have any lab test that is abnormal or we need to change your treatment, we will call you to review the results.  Testing/Procedures: None  Follow-Up: Your physician recommends that you keep your follow-up appointment on 08/31/18 with Dr. Cristal Deer.     Tests Ordered: Orders Placed This Encounter  Procedures  . Basic metabolic panel    Medication Changes: Meds ordered this encounter  Medications  . lisinopril (ZESTRIL) 20 MG tablet    Sig: Take 1 tablet (20 mg total) by mouth 2 (two) times a day.    Dispense:  180 tablet    Refill:  3    Disposition:  Follow up as scheduled 08/31/18   Signed, Jodelle Red, MD  07/19/2018 10:34 AM    [facility]

## 2018-07-19 NOTE — Patient Instructions (Signed)
Medication Instructions:  Increase: Lisinopril 20 mg two times a day If you need a refill on your cardiac medications before your next appointment, please call your pharmacy.   Lab work: Your physician recommends that you return for lab work in 10-14 days (BMP)  If you have labs (blood work) drawn today and your tests are completely normal, you will receive your results only by: Marland Kitchen MyChart Message (if you have MyChart) OR . A paper copy in the mail If you have any lab test that is abnormal or we need to change your treatment, we will call you to review the results.  Testing/Procedures: None  Follow-Up: Your physician recommends that you keep your follow-up appointment on 08/31/18 with Dr. Cristal Deer.

## 2018-07-21 ENCOUNTER — Other Ambulatory Visit: Payer: Self-pay | Admitting: Internal Medicine

## 2018-07-21 ENCOUNTER — Telehealth: Payer: Self-pay | Admitting: Cardiology

## 2018-07-21 MED ORDER — APIXABAN 5 MG PO TABS: 5.0000 mg | ORAL_TABLET | Freq: Two times a day (BID) | ORAL | 3 refills | Status: DC

## 2018-07-21 NOTE — Telephone Encounter (Signed)
New Message:    Patient returning your call concerning some samples. Please call patient.

## 2018-07-21 NOTE — Telephone Encounter (Signed)
Informed pt that we still currently out of Eliquis samples. Pt voiced understanding and asked if a 90 supply could be sent to [facility] pharmacy as she have found a better price with them. New Rx sent to requested pharmacy.

## 2018-07-25 ENCOUNTER — Other Ambulatory Visit: Payer: Self-pay | Admitting: Cardiology

## 2018-07-25 MED ORDER — APIXABAN 5 MG PO TABS: 5.0000 mg | ORAL_TABLET | Freq: Two times a day (BID) | ORAL | 1 refills | Status: DC

## 2018-07-25 NOTE — Telephone Encounter (Signed)
New Message   *STAT* If patient is at the pharmacy, call can be transferred to refill team.   1. Which medications need to be refilled? (please list name of each medication and dose if known) Eliquis 5mg   2. Which pharmacy/location (including street and city if local pharmacy) is medication to be sent to? [facility] [phone]  3. Do they need a 30 day or 90 day supply? 30 day supply

## 2018-07-26 ENCOUNTER — Encounter: Payer: Self-pay | Admitting: Cardiology

## 2018-07-26 ENCOUNTER — Other Ambulatory Visit: Payer: Self-pay | Admitting: Cardiology

## 2018-07-26 ENCOUNTER — Other Ambulatory Visit: Payer: Self-pay

## 2018-07-26 MED ORDER — APIXABAN 5 MG PO TABS: 5.0000 mg | ORAL_TABLET | Freq: Two times a day (BID) | ORAL | 6 refills | Status: DC

## 2018-07-26 NOTE — Telephone Encounter (Signed)
  Patient calling the office for samples of medication:   1.  What medication and dosage are you requesting samples for? apixaban (ELIQUIS) 5 MG TABS tablet  2.  Are you currently out of this medication? She has 3 pills left  Patient needs enough to get her through until [facility] Order sends her prescription.

## 2018-07-26 NOTE — Telephone Encounter (Signed)
 *  STAT* If patient is at the pharmacy, call can be transferred to refill team.   1. Which medications need to be refilled? (please list name of each medication and dose if known) apixaban (ELIQUIS) 5 MG TABS tablet  2. Which pharmacy/location (including street and city if local pharmacy) is medication to be sent to?   [facility] [phone] [place]  3. Do they need a 30 day or 90 day supply? 30 day

## 2018-07-26 NOTE — Telephone Encounter (Signed)
Spoke to patient advised office out of eliquis samples.Prescription sent to pharmacy.

## 2018-07-26 NOTE — Telephone Encounter (Signed)
Age 73, weight 68kg, SCr 1.02 on 07/11/18, appropriate for refill

## 2018-07-26 NOTE — Telephone Encounter (Signed)
error 

## 2018-08-02 DIAGNOSIS — Z79899 Other long term (current) drug therapy: Secondary | ICD-10-CM | POA: Diagnosis not present

## 2018-08-02 LAB — BASIC METABOLIC PANEL
BUN/Creatinine Ratio: 16 (ref 12–28)
BUN: 16 mg/dL (ref 8–27)
CO2: 22 mmol/L (ref 20–29)
Calcium: 10.7 mg/dL — ABNORMAL HIGH (ref 8.7–10.3)
Chloride: 101 mmol/L (ref 96–106)
Creatinine, Ser: 1.01 mg/dL — ABNORMAL HIGH (ref 0.57–1.00)
GFR calc Af Amer: 65 mL/min/{1.73_m2} (ref >59)
GFR calc non Af Amer: 56 mL/min/{1.73_m2} — ABNORMAL LOW (ref >59)
Glucose: 142 mg/dL — ABNORMAL HIGH (ref 65–99)
Potassium: 4.7 mmol/L (ref 3.5–5.2)
Sodium: 141 mmol/L (ref 134–144)

## 2018-08-03 ENCOUNTER — Encounter: Payer: Self-pay | Admitting: Internal Medicine

## 2018-08-17 ENCOUNTER — Encounter: Payer: Self-pay | Admitting: Obstetrics & Gynecology

## 2018-08-17 ENCOUNTER — Other Ambulatory Visit (HOSPITAL_COMMUNITY)
Admission: RE | Admit: 2018-08-17 | Discharge: 2018-08-17 | Disposition: A | Discharge status: Home / Self Care | Payer: Medicare PPO | Source: Ambulatory Visit | Attending: Obstetrics & Gynecology | Admitting: Obstetrics & Gynecology

## 2018-08-17 ENCOUNTER — Other Ambulatory Visit: Payer: Self-pay

## 2018-08-17 ENCOUNTER — Ambulatory Visit (INDEPENDENT_AMBULATORY_CARE_PROVIDER_SITE_OTHER): Payer: Medicare PPO | Admitting: Obstetrics & Gynecology

## 2018-08-17 VITALS — BP 124/70 | HR 60 | Temp 97.6°F | Ht 63.25 in | Wt 156.0 lb

## 2018-08-17 DIAGNOSIS — Z01419 Encounter for gynecological examination (general) (routine) without abnormal findings: Secondary | ICD-10-CM

## 2018-08-17 DIAGNOSIS — Z124 Encounter for screening for malignant neoplasm of cervix: Secondary | ICD-10-CM | POA: Diagnosis not present

## 2018-08-17 DIAGNOSIS — Z1151 Encounter for screening for human papillomavirus (HPV): Secondary | ICD-10-CM | POA: Insufficient documentation

## 2018-08-17 DIAGNOSIS — R87610 Atypical squamous cells of undetermined significance on cytologic smear of cervix (ASC-US): Secondary | ICD-10-CM | POA: Diagnosis not present

## 2018-08-17 NOTE — Patient Instructions (Signed)
Make sure vitamin has Biotin in it.   Mederma is something over the counter you can use on your scar.

## 2018-08-17 NOTE — Progress Notes (Addendum)
73 y.o. G50P0002 Widowed White or Caucasian female here for annual exam.   She a ruptured aortic aneurysm in late January.  Dr. Arbie Cookey did the surgery and she was in the hospital for 33 days.  Doesn't currently have a follow up with Dr. Arbie Cookey.  Did see cardiologist in March.  Did have follow-up via phone call due to hypertension.    Has stopped smoking since her surgery.  Having a lot of anxiety since her surgery.  Dr. Jonny Ruiz felt uncomfortable (per pt) of adding anything else with her restoril use.  Pt reports waking at night with hands and body just "clinched".  Does worry about being back in the hospital although she is doing very well from surgical standpoint.  Patient's last menstrual period was 03/29/1993 (approximate).          Sexually active: No.  The current method of family planning is post menopausal status.    Exercising: Yes.    walking Smoker:  no  Health Maintenance: Pap:  03/12/16 neg   01/31/14 Neg  History of abnormal Pap:  no MMG:  12/20/17 BIRADS1:neg  Colonoscopy:  2014 f/u 10 years  BMD:   09/07/16 Osteoporosis (followed by Dr. Jonny Ruiz) TDaP:  09/07/16  Pneumonia vaccine(s):  2016 Shingrix:   Zoster 2010.  Declines pneumovax. Hep C testing: No Screening Labs: PCP   reports that she has quit smoking. Her smoking use included cigarettes. She has never used smokeless tobacco. She reports that she does not drink alcohol or use drugs.  Past Medical History:  Diagnosis Date  . BACK PAIN 03/14/2009  . COPD 11/01/2006  . GLUCOSE INTOLERANCE 03/14/2009  . Headache(784.0) 03/14/2009  . HYPERTENSION 11/01/2006  . 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 INSOMNIA, HX OF 01/06/2007  . Other and unspecified hyperlipidemia 08/22/2013  . Urinary frequency 03/14/2009    Past Surgical History:  Procedure Laterality Date  . ABDOMINAL AORTIC ANEURYSM REPAIR N/A 04/28/2018   Procedure: ANEURYSM ABDOMINAL AORTIC REPAIR WITH HEMASHIELD GOLD VASCULAR GRAFT 22X11 mm;  Surgeon: Larina Earthly, MD;  Location: [facility] OR;  Service: Vascular;  Laterality: N/A;  . CARDIAC CATHETERIZATION  2003   negative  . DILATION AND CURETTAGE OF UTERUS    . TONSILLECTOMY    . TUBAL LIGATION      Current Outpatient Medications  Medication Sig Dispense Refill  . amLODipine (NORVASC) 10 MG tablet Take 1 tablet (10 mg total) by mouth daily. 90 tablet 3  . apixaban (ELIQUIS) 5 MG TABS tablet Take 1 tablet (5 mg total) by mouth 2 (two) times daily. 60 tablet 6  . fluticasone (FLONASE) 50 MCG/ACT nasal spray Place 2 sprays into both nostrils daily. 48 g 3  . levalbuterol (XOPENEX) 1.25 MG/0.5ML nebulizer solution Take 1.25 mg by nebulization every 6 (six) hours as needed for wheezing or shortness of breath. 270 each 1  . lisinopril (ZESTRIL) 20 MG tablet Take 1 tablet (20 mg total) by mouth 2 (two) times a day. 180 tablet 3  . metoprolol tartrate (LOPRESSOR) 50 MG tablet Take 1 tablet (50 mg total) by mouth 2 (two) times daily. 60 tablet 2  . potassium chloride (K-DUR) 10 MEQ tablet TAKE 2 TABLETS BY MOUTH DAILY 60 tablet 2  . pravastatin (PRAVACHOL) 20 MG tablet Take 1 tablet (20 mg total) by mouth daily. 90 tablet 3  . temazepam (RESTORIL) 15 MG capsule Take 1 capsule (15 mg total) by mouth at bedtime as needed for sleep. 30 capsule 5  No current facility-administered medications for this visit.     Family History  Problem Relation Age of Onset  . Coronary artery disease Other        female 1st degree relative <60 and female 1st degree relative <50  . Diabetes Father   . Heart attack Father   . Diabetes Mother   . Hypertension Mother   . Congestive Heart Failure Mother   . Colon cancer Neg Hx   . Breast cancer Neg Hx     Review of Systems  HENT:       Hair loss   All other systems reviewed and are negative.   Exam:   BP 124/70   Pulse 60   Temp 97.6 F (36.4 C) (Temporal)   Ht 5' 3.25" (1.607 m)   Wt 156 lb (70.8 kg)   LMP 03/29/1993 (Approximate)   BMI 27.42 kg/m    Height: 5'  3.25" (160.7 cm)  Ht Readings from Last 3 Encounters:  08/17/18 5' 3.25" (1.607 m)  07/19/18 5' (1.524 m)  06/17/18 5' (1.524 m)    General appearance: alert, cooperative and appears stated age Head: Normocephalic, without obvious abnormality, atraumatic Neck: no adenopathy, supple, symmetrical, trachea midline and thyroid normal to inspection and palpation Lungs: clear to auscultation bilaterally Breasts: normal appearance, no masses or tenderness Heart: regular rate and rhythm Abdomen: soft, non-tender; bowel sounds normal; no masses,  no organomegaly Extremities: extremities normal, atraumatic, no cyanosis or edema Skin: Skin color, texture, turgor normal. No rashes or lesions Lymph nodes: Cervical, supraclavicular, and axillary nodes normal. No abnormal inguinal nodes palpated Neurologic: Grossly normal   Pelvic: External genitalia:  no lesions              Urethra:  normal appearing urethra with no masses, tenderness or lesions              Bartholins and Skenes: normal                 Vagina: normal appearing vagina with normal color and discharge, no lesions              Cervix: no lesions              Pap taken: Yes.   Bimanual Exam:  Uterus:  normal size, contour, position, consistency, mobility, non-tender              Adnexa: normal adnexa and no mass, fullness, tenderness               Rectovaginal: Confirms               Anus:  normal sphincter tone, no lesions  Chaperone was present for exam.  A:  Well Woman with normal exam PMP, no HRT H/O AAA with repair 03/2018 with Dr. Arbie CookeyEarly Hypertension Elevated lipids Anxiety Stopped smoking after surgery Osteoporosis  P:   Mammogram guidelines reviewed.  Due 11/2018. pap smear obtained today Will communicate with Dr. [name] about treatment for anxiety Labs/vaccines UTD BMD due again this year return annually or prn

## 2018-08-23 LAB — CYTOLOGY - PAP
Diagnosis: UNDETERMINED — AB
HPV: NOT DETECTED

## 2018-08-25 ENCOUNTER — Telehealth: Payer: Self-pay | Admitting: *Deleted

## 2018-08-25 NOTE — Telephone Encounter (Signed)
-----   Message from Jerene Bears, MD sent at 08/25/2018 10:11 AM EDT ----- Can you please let pt know her pap showed ASCUS with neg HR HPV but this is considered normal and she does not need any additional evaluation.  I just wanted her to know this was normal if she looks at the results.  When she was here, she expressed some concerns about depression.  I communicated with Dr. Jonny Ruiz who is fine if I start her on treatment.  Please send in rx for citalopram 10mg .  Please sent in tablet and she should take 1/2 tab daily for 4 days and then increase to full take.  This can cause nausea and taking this in 1/2 tab for a few days will help.   I'd like to have a follow up in 2 weeks.  Should do webex if possible or phone follow up if that is best for pt.  Thanks.

## 2018-08-25 NOTE — Telephone Encounter (Signed)
Notes recorded by Leda Min, RN on 08/25/2018 at 1:00 PM EDT Spoke with patient, advised as seen below per Dr. Hyacinth Meeker. Patient declines Rx for citalopram. Patient states if there is any chance she will experience nausea she does not want medication. Advised I will update Dr. Hyacinth Meeker and return call if any additional recommendations. Patient verbalizes understanding and is agreeable. See telephone encounter dated 5/29 to update provider.   Routing to Dr. Hyacinth Meeker

## 2018-08-27 NOTE — Telephone Encounter (Signed)
Please let her know all of the medications in that class can cause nausea but it is usually temporary so that's why we start low and taper it up after a few days.  If she changes her mind in the future, she can always call back and let me know.  Thanks.

## 2018-08-28 ENCOUNTER — Telehealth: Payer: Self-pay

## 2018-08-28 NOTE — Telephone Encounter (Signed)
Virtual Visit Pre-Appointment Phone Call  "(Name), I am calling you today to discuss your upcoming appointment. We are currently trying to limit exposure to the virus that causes COVID-19 by seeing patients at home rather than in the office."  1. "What is the BEST phone number to call the day of the visit?" - include this in appointment notes  2. "Do you have or have access to (through a family member/friend) a smartphone with video capability that we can use for your visit?" a. If yes - list this number in appt notes as "cell" (if different from BEST phone #) and list the appointment type as a VIDEO visit in appointment notes b. If no - list the appointment type as a PHONE visit in appointment notes  3. Confirm consent - "In the setting of the current Covid19 crisis, you are scheduled for a (phone or video) visit with your provider on (date) at (time).  Just as we do with many in-office visits, in order for you to participate in this visit, we must obtain consent.  If you'd like, I can send this to your mychart (if signed up) or email for you to review.  Otherwise, I can obtain your verbal consent now.  All virtual visits are billed to your insurance company just like a normal visit would be.  By agreeing to a virtual visit, we'd like you to understand that the technology does not allow for your provider to perform an examination, and thus may limit your provider's ability to fully assess your condition. If your provider identifies any concerns that need to be evaluated in person, we will make arrangements to do so.  Finally, though the technology is pretty good, we cannot assure that it will always work on either your or our end, and in the setting of a video visit, we may have to convert it to a phone-only visit.  In either situation, we cannot ensure that we have a secure connection.  Are you willing to proceed?" STAFF: Did the patient verbally acknowledge consent to telehealth visit? Document  YES/NO here: Yes  4. Advise patient to be prepared - "Two hours prior to your appointment, go ahead and check your blood pressure, pulse, oxygen saturation, and your weight (if you have the equipment to check those) and write them all down. When your visit starts, your provider will ask you for this information. If you have an Apple Watch or Kardia device, please plan to have heart rate information ready on the day of your appointment. Please have a pen and paper handy nearby the day of the visit as well."  5. Give patient instructions for MyChart download to smartphone OR Doximity/Doxy.me as below if video visit (depending on what platform provider is using)  6. Inform patient they will receive a phone call 15 minutes prior to their appointment time (may be from unknown caller ID) so they should be prepared to answer    TELEPHONE CALL NOTE  Leah Mata has been deemed a candidate for a follow-up tele-health visit to limit community exposure during the Covid-19 pandemic. I spoke with the patient via phone to ensure availability of phone/video source, confirm preferred email & phone number, and discuss instructions and expectations.  I reminded Leah Mata to be prepared with any vital sign and/or heart rhythm information that could potentially be obtained via home monitoring, at the time of her visit. I reminded Leah Mata to expect a phone call prior to  her visit.  Parke PoissonAlisha N Lyndal Alamillo, RN 08/28/2018 3:35 PM   INSTRUCTIONS FOR DOWNLOADING THE MYCHART APP TO SMARTPHONE  - The patient must first make sure to have activated MyChart and know their login information - If Apple, go to [employer] Store and type in MyChart in the search bar and download the app. If Android, ask patient to go to [employer] Play Store and type in [place] in the search bar and download the app. The app is free but as with any other app downloads, their phone may require them to verify saved payment information or Apple/Android  password.  - The patient will need to then log into the app with their MyChart username and password, and select [facility] as their healthcare provider to link the account. When it is time for your visit, go to the MyChart app, find appointments, and click Begin Video Visit. Be sure to Select Allow for your device to access the Microphone and Camera for your visit. You will then be connected, and your provider will be with you shortly.  **If they have any issues connecting, or need assistance please contact MyChart service desk [phone])**  **If using a computer, in order to ensure the best quality for their visit they will need to use either of the following Internet Browsers: [employer] Edge, or Google Chrome**  IF USING DOXIMITY or DOXY.ME - The patient will receive a link just prior to their visit by text.     FULL LENGTH CONSENT FOR TELE-HEALTH VISIT   I hereby voluntarily request, consent and authorize [facility] and its employed or contracted physicians, physician assistants, nurse practitioners or other licensed health care professionals (the Practitioner), to provide me with telemedicine health care services (the "Services") as deemed necessary by the treating Practitioner. I acknowledge and consent to receive the Services by the Practitioner via telemedicine. I understand that the telemedicine visit will involve communicating with the Practitioner through live audiovisual communication technology and the disclosure of certain medical information by electronic transmission. I acknowledge that I have been given the opportunity to request an in-person assessment or other available alternative prior to the telemedicine visit and am voluntarily participating in the telemedicine visit.  I understand that I have the right to withhold or withdraw my consent to the use of telemedicine in the course of my care at any time, without affecting my right to future care or treatment,  and that the Practitioner or I may terminate the telemedicine visit at any time. I understand that I have the right to inspect all information obtained and/or recorded in the course of the telemedicine visit and may receive copies of available information for a reasonable fee.  I understand that some of the potential risks of receiving the Services via telemedicine include:  Marland Kitchen. Delay or interruption in medical evaluation due to technological equipment failure or disruption; . Information transmitted may not be sufficient (e.g. poor resolution of images) to allow for appropriate medical decision making by the Practitioner; and/or  . In rare instances, security protocols could fail, causing a breach of personal health information.  Furthermore, I acknowledge that it is my responsibility to provide information about my medical history, conditions and care that is complete and accurate to the best of my ability. I acknowledge that Practitioner's advice, recommendations, and/or decision may be based on factors not within their control, such as incomplete or inaccurate data provided by me or distortions of diagnostic images or specimens that may result from electronic transmissions.  I understand that the practice of medicine is not an exact science and that Practitioner makes no warranties or guarantees regarding treatment outcomes. I acknowledge that I will receive a copy of this consent concurrently upon execution via email to the email address I last provided but may also request a printed copy by calling the office of [facility].    I understand that my insurance will be billed for this visit.   I have read or had this consent read to me. . I understand the contents of this consent, which adequately explains the benefits and risks of the Services being provided via telemedicine.  . I have been provided ample opportunity to ask questions regarding this consent and the Services and have had my questions  answered to my satisfaction. . I give my informed consent for the services to be provided through the use of telemedicine in my medical care  By participating in this telemedicine visit I agree to the above.

## 2018-08-28 NOTE — Telephone Encounter (Signed)
Spoke with patient, advised as seen below per Dr. Hyacinth Meeker. Patient declines RX.  Patient states she has an appt with her PCP 6/10, will further discuss options at that time. Patient thankful for return call.   Routing to provider for final review. Patient is agreeable to disposition. Will close encounter.

## 2018-08-29 ENCOUNTER — Ambulatory Visit: Payer: Medicare PPO | Admitting: Obstetrics & Gynecology

## 2018-08-29 ENCOUNTER — Telehealth: Payer: Self-pay

## 2018-08-29 ENCOUNTER — Telehealth: Payer: Self-pay | Admitting: Cardiology

## 2018-08-29 NOTE — Telephone Encounter (Signed)
patient waiting on nurse to call her back about Eliquis samples/ call home phone/ verbal consent/ my chart/ pre reg completed

## 2018-08-29 NOTE — Telephone Encounter (Signed)
Called to notify patient that samples of Eliquis were placed up front.  Patient verbalized understanding.

## 2018-08-31 ENCOUNTER — Telehealth (INDEPENDENT_AMBULATORY_CARE_PROVIDER_SITE_OTHER): Payer: Medicare PPO | Admitting: Cardiology

## 2018-08-31 ENCOUNTER — Encounter: Payer: Self-pay | Admitting: Cardiology

## 2018-08-31 VITALS — BP 142/83 | HR 57 | Ht 66.0 in | Wt 153.0 lb

## 2018-08-31 DIAGNOSIS — Z9889 Other specified postprocedural states: Secondary | ICD-10-CM | POA: Diagnosis not present

## 2018-08-31 DIAGNOSIS — I480 Paroxysmal atrial fibrillation: Secondary | ICD-10-CM

## 2018-08-31 DIAGNOSIS — I10 Essential (primary) hypertension: Secondary | ICD-10-CM

## 2018-08-31 DIAGNOSIS — Z7189 Other specified counseling: Secondary | ICD-10-CM

## 2018-08-31 DIAGNOSIS — E7800 Pure hypercholesterolemia, unspecified: Secondary | ICD-10-CM | POA: Diagnosis not present

## 2018-08-31 NOTE — Patient Instructions (Signed)

## 2018-08-31 NOTE — Progress Notes (Signed)
Virtual Visit via Telephone Note   This visit type was conducted due to national recommendations for restrictions regarding the COVID-19 Pandemic (e.g. social distancing) in an effort to limit this patient's exposure and mitigate transmission in our community.  Due to her co-morbid illnesses, this patient is at least at moderate risk for complications without adequate follow up.  This format is felt to be most appropriate for this patient at this time.  The patient did not have access to video technology/had technical difficulties with video requiring transitioning to audio format only (telephone).  All issues noted in this document were discussed and addressed.  No physical exam could be performed with this format.  Please refer to the patient's chart for her  consent to telehealth for [facility].   Date:  08/31/2018   ID:  Leah JewelLinda D Mata, DOB 07/13/1945, MRN [id number]  Patient Location: Home Provider Location: Home  PCP:  Leah LevinsJohn, James W, MD  Cardiologist:  Leah RedBridgette Severus Brodzinski, MD  Electrophysiologist:  None   Evaluation Performed:  Follow-Up Visit  Chief Complaint:  Follow up on blood pressure  History of Present Illness:    Leah Mata is a 73 y.o. female with hypertension, hyperlipidemia, AAAwith ruptured abdominal aortic aneurysm with surgery on 04/29/2018.She had a >30 day admission, with discharge on 05/30/18.  The patient does not have symptoms concerning for COVID-19 infection (fever, chills, cough, or new shortness of breath).   Updates today: Overall feeling well. BP trend has been improving since changes made at last visit. No issues with side effects from the medication.  Most recent daily blood pressures (today is first) 142/83 hasn't taken AM meds yet 124/64 125/69 143/69 first thing in AM (no meds)  She is taking medications as prescribed. On apixaban, it is expensive for her. Hasn't heard from case worker about patient assistance program, but would be  interested in an update on this. Still tobacco free, has been tempted but has resisted, congratulated on this. Is social distancing, hasn't been very active but able to do her desired activities without limitations.  Denies chest pain, shortness of breath at rest or with normal exertion. No PND, orthopnea, LE edema or unexpected weight gain. No syncope or palpitations. She is losing hair. We reviewed her med list, no clear culprits. She is going to see Dr. [name] in the near future, I discussed having her thyroid checked.  Past Medical History:  Diagnosis Date  . BACK PAIN 03/14/2009  . COPD 11/01/2006  . GLUCOSE INTOLERANCE 03/14/2009  . Headache(784.0) 03/14/2009  . HYPERTENSION 11/01/2006  . 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 INSOMNIA, HX OF 01/06/2007  . Other and unspecified hyperlipidemia 08/22/2013  . Urinary frequency 03/14/2009   Past Surgical History:  Procedure Laterality Date  . ABDOMINAL AORTIC ANEURYSM REPAIR N/A 04/28/2018   Procedure: ANEURYSM ABDOMINAL AORTIC REPAIR WITH HEMASHIELD GOLD VASCULAR GRAFT 22X11 mm;  Surgeon: Leah EarthlyEarly, Todd F, MD;  [facility];  Service: Vascular;  Laterality: N/A;  . CARDIAC CATHETERIZATION  2003   negative  . DILATION AND CURETTAGE OF UTERUS    . TONSILLECTOMY    . TUBAL LIGATION      Current Meds  Medication Sig  . amLODipine (NORVASC) 10 MG tablet Take 1 tablet (10 mg total) by mouth daily.  Marland Kitchen. apixaban (ELIQUIS) 5 MG TABS tablet Take 1 tablet (5 mg total) by mouth 2 (two) times daily.  . fluticasone (FLONASE) 50 MCG/ACT nasal spray Place 1 spray into both nostrils as  needed for allergies or rhinitis.  Marland Kitchen levalbuterol (XOPENEX) 1.25 MG/0.5ML nebulizer solution Take 1.25 mg by nebulization every 6 (six) hours as needed for wheezing or shortness of breath.  . lisinopril (ZESTRIL) 20 MG tablet Take 1 tablet (20 mg total) by mouth 2 (two) times a day.  . metoprolol tartrate (LOPRESSOR) 50 MG tablet Take 1 tablet (50 mg total) by mouth 2  (two) times daily.  . potassium chloride (K-DUR) 10 MEQ tablet Take 10 mEq by mouth as needed.  . pravastatin (PRAVACHOL) 20 MG tablet Take 1 tablet (20 mg total) by mouth daily.  . temazepam (RESTORIL) 15 MG capsule Take 1 capsule (15 mg total) by mouth at bedtime as needed for sleep.    Allergies:   Hydrochlorothiazide   Social History   Tobacco Use  . Smoking status: Former Smoker    Types: Cigarettes  . Smokeless tobacco: Never Used  . Tobacco comment: 2 packs per week  Substance Use Topics  . Alcohol use: No  . Drug use: No     Family Hx: The patient's family history includes Congestive Heart Failure in her mother; Coronary artery disease in an other family member; Diabetes in her father and mother; Heart attack in her father; Hypertension in her mother. There is no history of Colon cancer or Breast cancer.  ROS:   Please see the history of present illness.    Constitutional: Negative for chills, fever, night sweats, unintentional weight loss  HENT: Negative for ear pain and hearing loss.   Eyes: Negative for loss of vision and eye pain.  Respiratory: Negative for cough, sputum, wheezing.   Cardiovascular: See HPI. Gastrointestinal: Negative for abdominal pain, melena, and hematochezia.  Genitourinary: Negative for dysuria and hematuria.  Musculoskeletal: Negative for falls and myalgias.  Skin: Negative for itching and rash.  Neurological: Negative for focal weakness, focal sensory changes and loss of consciousness.  Endo/Heme/Allergies: Does bruise/bleed easily.  All other systems reviewed and are negative.   Prior CV studies:   The following studies were reviewed today: Echo 04/2018  Labs/Other Tests and Data Reviewed:    EKG:  An ECG dated 06/17/18 was personally reviewed today and demonstrated:  NSR  Recent Labs: 05/15/2018: Magnesium 1.8 05/20/2018: TSH 1.723 06/17/2018: ALT 14; B Natriuretic Peptide 340.2; Hemoglobin 10.8; Platelets 279 08/02/2018: BUN 16;  Creatinine, Ser 1.01; Potassium 4.7; Sodium 141   Recent Lipid Panel Lab Results  Component Value Date/Time   CHOL 136 05/09/2018 02:42 AM   TRIG 131 05/15/2018 06:57 AM   HDL 19 (L) 05/09/2018 02:42 AM   CHOLHDL 7.2 05/09/2018 02:42 AM   LDLCALC 68 05/09/2018 02:42 AM   LDLDIRECT 84.1 01/31/2014 11:43 AM    Wt Readings from Last 3 Encounters:  08/31/18 153 lb (69.4 kg)  08/17/18 156 lb (70.8 kg)  07/19/18 150 lb 12.8 oz (68.4 kg)     Objective:    Vital Signs:  BP (!) 142/83 (BP Location: Left Arm, Patient Position: Sitting, Cuff Size: Normal)   Pulse (!) 57   Ht 5\' 6"  (1.676 m)   Wt 153 lb (69.4 kg)   LMP 03/29/1993 (Approximate)   BMI 24.69 kg/m    Speaking comfortably on the phone, in no acute distress.  ASSESSMENT & PLAN:    Hypertension: Much improved on current regimen. Denies spikes in BP. Readings post medications are in the 120s systolic, pre-medication in the 140s systolic. -continue amlodipine 10 mg daily -continue lisinopril 20 mg BID (patient preference for BID, ok  by me, want 40 mg total daily dose) -BMET stable on recheck -currently on metoprolol 50 mg BID. Next change will be to change this to carvedilol -discussed red flag symptoms that need immediate medical attention  Paroxysmal atrial fib and flutter: CHA2DS2/VAS Stroke Risk Points=4 -in NSR on most recent ECG, denies palpitations or tachycardia -continue apixaban 5 mg BID. Her concern is cost, is waiting to hear back from case manager. She cannot remember who she was speaking to, but she will find the information and let me know so that I can reach out. -on metoprolol for rate control -likely prior tobacco history/lung disease exacerbated her post op arrhythmia. Now tobacco free.  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 Quit smoking and has remained abstinent.  Chronic diastolic dysfunction: now euvolemic, weight stable off  furosemide/potassium. Denies any change in symptoms.  Hypercholesterolemia: with PAD, LDL goal <70.  -last LDL 68 -continue pravastatin 20 mg, though ideally given her ASCVD would like a higher intensity statin. Continue to address.  Cardiac risk counseling and prevention recommendations: -recommend heart healthy/Mediterranean diet, with whole grains, fruits, vegetable, fish, lean meats, nuts, and olive oil. Limit salt. -recommend moderate walking, 3-5 times/week for 30-50 minutes each session. Aim for at least 150 minutes.week. Goal should be pace of 3 miles/hours, or walking 1.5 miles in 30 minutes -recommend avoidance of tobacco products. Avoid excess alcohol.  COVID-19 Education: The signs and symptoms of COVID-19 were discussed with the patient and how to seek care for testing (follow up with PCP or arrange E-visit). The importance of social distancing was discussed today.  Time:   Today, I have spent 22 minutes with the patient with telehealth technology discussing the above problems. Total time including review of studies was 25 minutes.   Patient Instructions  Medication Instructions:  Your Physician recommend you continue on your current medication as directed.    If you need a refill on your cardiac medications before your next appointment, please call your pharmacy.   Lab work: None  Testing/Procedures: None  Follow-Up: At [facility], you and your health needs are our priority.  As part of our continuing mission to provide you with exceptional heart care, we have created designated Provider Care Teams.  These Care Teams include your primary Cardiologist (physician) and Advanced Practice Providers (APPs -  Physician Assistants and Nurse Practitioners) who all work together to provide you with the care you need, when you need it. You will need a follow up appointment in 6 months.  Please call our office 2 months in advance to schedule this appointment.  You may see  Leah Red, MD or one of the following Advanced Practice Providers on your designated Care Team:   Theodore Demark, PA-C . Joni Reining, DNP, ANP        Medication Adjustments/Labs and Tests Ordered: Current medicines are reviewed at length with the patient today.  Concerns regarding medicines are outlined above.   Tests Ordered: No orders of the defined types were placed in this encounter.   Medication Changes: No orders of the defined types were placed in this encounter.   Disposition:  Follow up 6 mos  Signed, Leah Red, MD  08/31/2018 12:48 PM    [facility]

## 2018-09-06 ENCOUNTER — Encounter: Payer: Self-pay | Admitting: Internal Medicine

## 2018-09-06 ENCOUNTER — Ambulatory Visit (INDEPENDENT_AMBULATORY_CARE_PROVIDER_SITE_OTHER): Payer: Medicare PPO | Admitting: Internal Medicine

## 2018-09-06 ENCOUNTER — Other Ambulatory Visit (INDEPENDENT_AMBULATORY_CARE_PROVIDER_SITE_OTHER): Payer: Medicare PPO

## 2018-09-06 ENCOUNTER — Other Ambulatory Visit: Payer: Self-pay

## 2018-09-06 ENCOUNTER — Other Ambulatory Visit: Payer: Self-pay | Admitting: Internal Medicine

## 2018-09-06 VITALS — BP 116/70 | HR 60 | Temp 98.1°F | Ht 66.0 in | Wt 156.0 lb

## 2018-09-06 DIAGNOSIS — E559 Vitamin D deficiency, unspecified: Secondary | ICD-10-CM

## 2018-09-06 DIAGNOSIS — E611 Iron deficiency: Secondary | ICD-10-CM | POA: Diagnosis not present

## 2018-09-06 DIAGNOSIS — R7303 Prediabetes: Secondary | ICD-10-CM | POA: Diagnosis not present

## 2018-09-06 DIAGNOSIS — E538 Deficiency of other specified B group vitamins: Secondary | ICD-10-CM

## 2018-09-06 DIAGNOSIS — J449 Chronic obstructive pulmonary disease, unspecified: Secondary | ICD-10-CM

## 2018-09-06 DIAGNOSIS — Z0000 Encounter for general adult medical examination without abnormal findings: Secondary | ICD-10-CM

## 2018-09-06 LAB — HEPATIC FUNCTION PANEL
ALT: 11 U/L (ref 0–35)
AST: 15 U/L (ref 0–37)
Albumin: 4 g/dL (ref 3.5–5.2)
Alkaline Phosphatase: 80 U/L (ref 39–117)
Bilirubin, Direct: 0.1 mg/dL (ref 0.0–0.3)
Total Bilirubin: 0.4 mg/dL (ref 0.2–1.2)
Total Protein: 7.3 g/dL (ref 6.0–8.3)

## 2018-09-06 LAB — BASIC METABOLIC PANEL
BUN: 18 mg/dL (ref 6–23)
CO2: 28 mEq/L (ref 19–32)
Calcium: 10.4 mg/dL (ref 8.4–10.5)
Chloride: 102 mEq/L (ref 96–112)
Creatinine, Ser: 0.98 mg/dL (ref 0.40–1.20)
GFR: 55.88 mL/min — ABNORMAL LOW (ref >60.00)
Glucose, Bld: 99 mg/dL (ref 70–99)
Potassium: 4.3 mEq/L (ref 3.5–5.1)
Sodium: 135 mEq/L (ref 135–145)

## 2018-09-06 LAB — URINALYSIS, ROUTINE W REFLEX MICROSCOPIC
Bilirubin Urine: NEGATIVE
Ketones, ur: NEGATIVE
Leukocytes,Ua: NEGATIVE
Nitrite: NEGATIVE
RBC / HPF: NONE SEEN (ref 0–?)
Specific Gravity, Urine: 1.015 (ref 1.000–1.030)
Total Protein, Urine: NEGATIVE
Urine Glucose: NEGATIVE
Urobilinogen, UA: 0.2 (ref 0.0–1.0)
pH: 6 (ref 5.0–8.0)

## 2018-09-06 LAB — CBC WITH DIFFERENTIAL/PLATELET
Basophils Absolute: 0 10*3/uL (ref 0.0–0.1)
Basophils Relative: 0.5 % (ref 0.0–3.0)
Eosinophils Absolute: 0 10*3/uL (ref 0.0–0.7)
Eosinophils Relative: 0.5 % (ref 0.0–5.0)
HCT: 39.2 % (ref 36.0–46.0)
Hemoglobin: 13.1 g/dL (ref 12.0–15.0)
Lymphocytes Relative: 28.4 % (ref 12.0–46.0)
Lymphs Abs: 2.5 10*3/uL (ref 0.7–4.0)
MCHC: 33.5 g/dL (ref 30.0–36.0)
MCV: 94.2 fl (ref 78.0–100.0)
Monocytes Absolute: 0.9 10*3/uL (ref 0.1–1.0)
Monocytes Relative: 10 % (ref 3.0–12.0)
Neutro Abs: 5.3 10*3/uL (ref 1.4–7.7)
Neutrophils Relative %: 60.6 % (ref 43.0–77.0)
Platelets: 205 10*3/uL (ref 150.0–400.0)
RBC: 4.16 Mil/uL (ref 3.87–5.11)
RDW: 13.4 % (ref 11.5–15.5)
WBC: 8.7 10*3/uL (ref 4.0–10.5)

## 2018-09-06 LAB — LIPID PANEL
Cholesterol: 139 mg/dL (ref 0–200)
HDL: 48.2 mg/dL (ref >39.00)
LDL Cholesterol: 70 mg/dL (ref 0–99)
NonHDL: 91.28
Total CHOL/HDL Ratio: 3
Triglycerides: 105 mg/dL (ref 0.0–149.0)
VLDL: 21 mg/dL (ref 0.0–40.0)

## 2018-09-06 LAB — VITAMIN D 25 HYDROXY (VIT D DEFICIENCY, FRACTURES): VITD: 27.3 ng/mL — ABNORMAL LOW (ref 30.00–100.00)

## 2018-09-06 LAB — TSH: TSH: 0.87 u[IU]/mL (ref 0.35–4.50)

## 2018-09-06 LAB — VITAMIN B12: Vitamin B-12: 327 pg/mL (ref 211–911)

## 2018-09-06 LAB — IBC PANEL
Iron: 77 ug/dL (ref 42–145)
Saturation Ratios: 27.6 % (ref 20.0–50.0)
Transferrin: 199 mg/dL — ABNORMAL LOW (ref 212.0–360.0)

## 2018-09-06 LAB — HEMOGLOBIN A1C: Hgb A1c MFr Bld: 6.1 % (ref 4.6–6.5)

## 2018-09-06 MED ORDER — VITAMIN D (ERGOCALCIFEROL) 1.25 MG (50000 UNIT) PO CAPS: 50000.0000 [IU] | ORAL_CAPSULE | ORAL | 0 refills | Status: DC

## 2018-09-06 NOTE — Progress Notes (Signed)
Subjective:    Patient ID: Leah Mata, female    DOB: June 29, 1945, 73 y.o.   MRN: [id number]  HPI  Here for wellness and f/u;  Overall doing ok;  Pt denies Chest pain, worsening SOB, DOE, wheezing, orthopnea, PND, worsening LE edema, palpitations, dizziness or syncope.  Pt denies neurological change such as new headache, facial or extremity weakness.  Pt denies polydipsia, polyuria, or low sugar symptoms. Pt states overall good compliance with treatment and medications, good tolerability, and has been trying to follow appropriate diet.  Pt denies worsening depressive symptoms, suicidal ideation or panic. No fever, night sweats, wt loss, loss of appetite, or other constitutional symptoms.  Pt states good ability with ADL's, has low fall risk, home safety reviewed and adequate, no other significant changes in hearing or vision, and only occasionally active with exercise.  Swelling much improved.  Has some hair loss overall , but asks for thyroid check.  No other new complaints Past Medical History:  Diagnosis Date  . BACK PAIN 03/14/2009  . COPD 11/01/2006  . GLUCOSE INTOLERANCE 03/14/2009  . Headache(784.0) 03/14/2009  . HYPERTENSION 11/01/2006  . 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 INSOMNIA, HX OF 01/06/2007  . Other and unspecified hyperlipidemia 08/22/2013  . Urinary frequency 03/14/2009   Past Surgical History:  Procedure Laterality Date  . ABDOMINAL AORTIC ANEURYSM REPAIR N/A 04/28/2018   Procedure: ANEURYSM ABDOMINAL AORTIC REPAIR WITH HEMASHIELD GOLD VASCULAR GRAFT 22X11 mm;  Surgeon: Rosetta Posner, MD;  Location: [facility];  Service: Vascular;  Laterality: N/A;  . CARDIAC CATHETERIZATION  2003   negative  . DILATION AND CURETTAGE OF UTERUS    . TONSILLECTOMY    . TUBAL LIGATION      reports that she has quit smoking. Her smoking use included cigarettes. She has never used smokeless tobacco. She reports that she does not drink alcohol or use drugs. family history includes  Congestive Heart Failure in her mother; Coronary artery disease in an other family member; Diabetes in her father and mother; Heart attack in her father; Hypertension in her mother. Allergies  Allergen Reactions  . Hydrochlorothiazide Other (See Comments)    unknown   Current Outpatient Medications on File Prior to Visit  Medication Sig Dispense Refill  . amLODipine (NORVASC) 10 MG tablet Take 1 tablet (10 mg total) by mouth daily. 90 tablet 3  . apixaban (ELIQUIS) 5 MG TABS tablet Take 1 tablet (5 mg total) by mouth 2 (two) times daily. 60 tablet 6  . fluticasone (FLONASE) 50 MCG/ACT nasal spray Place 1 spray into both nostrils as needed for allergies or rhinitis.    Marland Kitchen levalbuterol (XOPENEX) 1.25 MG/0.5ML nebulizer solution Take 1.25 mg by nebulization every 6 (six) hours as needed for wheezing or shortness of breath. 270 each 1  . lisinopril (ZESTRIL) 20 MG tablet Take 1 tablet (20 mg total) by mouth 2 (two) times a day. 180 tablet 3  . metoprolol tartrate (LOPRESSOR) 50 MG tablet Take 1 tablet (50 mg total) by mouth 2 (two) times daily. 60 tablet 2  . potassium chloride (K-DUR) 10 MEQ tablet Take 10 mEq by mouth as needed.    . pravastatin (PRAVACHOL) 20 MG tablet Take 1 tablet (20 mg total) by mouth daily. 90 tablet 3  . temazepam (RESTORIL) 15 MG capsule Take 1 capsule (15 mg total) by mouth at bedtime as needed for sleep. 30 capsule 5   No current facility-administered medications on file prior to visit.  Review of Systems Constitutional: Negative for other unusual diaphoresis, sweats, appetite or weight changes HENT: Negative for other worsening hearing loss, ear pain, facial swelling, mouth sores or neck stiffness.   Eyes: Negative for other worsening pain, redness or other visual disturbance.  Respiratory: Negative for other stridor or swelling Cardiovascular: Negative for other palpitations or other chest pain  Gastrointestinal: Negative for worsening diarrhea or loose stools,  blood in stool, distention or other pain Genitourinary: Negative for hematuria, flank pain or other change in urine volume.  Musculoskeletal: Negative for myalgias or other joint swelling.  Skin: Negative for other color change, or other wound or worsening drainage.  Neurological: Negative for other syncope or numbness. Hematological: Negative for other adenopathy or swelling Psychiatric/Behavioral: Negative for hallucinations, other worsening agitation, SI, self-injury, or new decreased concentration All other system neg per pt    Objective:   Physical Exam BP 116/70   Pulse 60   Temp 98.1 F (36.7 C) (Oral)   Ht 5\' 6"  (1.676 m)   Wt 156 lb (70.8 kg)   LMP 03/29/1993 (Approximate)   SpO2 96%   BMI 25.18 kg/m  VS noted,  Constitutional: Pt is oriented to person, place, and time. Appears well-developed and well-nourished, in no significant distress and comfortable Head: Normocephalic and atraumatic  Eyes: Conjunctivae and EOM are normal. Pupils are equal, round, and reactive to light Right Ear: External ear normal without discharge Left Ear: External ear normal without discharge Nose: Nose without discharge or deformity Mouth/Throat: Oropharynx is without other ulcerations and moist  Neck: Normal range of motion. Neck supple. No JVD present. No tracheal deviation present or significant neck LA or mass Cardiovascular: Normal rate, regular rhythm, normal heart sounds and intact distal pulses.   Pulmonary/Chest: WOB normal and breath sounds without rales or wheezing  Abdominal: Soft. Bowel sounds are normal. NT. No HSM  Musculoskeletal: Normal range of motion. Exhibits no edema Lymphadenopathy: Has no other cervical adenopathy.  Neurological: Pt is alert and oriented to person, place, and time. Pt has normal reflexes. No cranial nerve deficit. Motor grossly intact, Gait intact Skin: Skin is warm and dry. No rash noted or new ulcerations Psychiatric:  Has normal mood and affect.  Behavior is normal without agitation No other exam findings Lab Results  Component Value Date   WBC 8.7 09/06/2018   HGB 13.1 09/06/2018   HCT 39.2 09/06/2018   PLT 205.0 09/06/2018   GLUCOSE 99 09/06/2018   CHOL 139 09/06/2018   TRIG 105.0 09/06/2018   HDL 48.20 09/06/2018   LDLDIRECT 84.1 01/31/2014   LDLCALC 70 09/06/2018   ALT 11 09/06/2018   AST 15 09/06/2018   NA 135 09/06/2018   K 4.3 09/06/2018   CL 102 09/06/2018   CREATININE 0.98 09/06/2018   BUN 18 09/06/2018   CO2 28 09/06/2018   TSH 0.87 09/06/2018   INR 1.16 04/30/2018   HGBA1C 6.1 09/06/2018       Assessment & Plan:

## 2018-09-06 NOTE — Assessment & Plan Note (Signed)
stable overall by history and exam, recent data reviewed with pt, and pt to continue medical treatment as before,  to f/u any worsening symptoms or concerns  

## 2018-09-06 NOTE — Patient Instructions (Signed)

## 2018-09-07 ENCOUNTER — Telehealth: Payer: Self-pay

## 2018-09-07 NOTE — Telephone Encounter (Signed)
-----   Message from Biagio Borg, MD sent at 09/06/2018  6:10 PM EDT ----- Left message on MyChart, pt to cont same tx except  The test results show that your current treatment is OK, except the Vitamin D level is low.  Please take Vitamin D 50000 units weekly for 12 weeks, then plan to change to OTC Vitamin D3 at 2000 units per day, indefinitely.Redmond Baseman to please inform pt, I will do rx

## 2018-09-07 NOTE — Telephone Encounter (Signed)
Attempted to call pt, no VM, phone continuously rings.   CRM created.

## 2018-09-20 ENCOUNTER — Other Ambulatory Visit: Payer: Self-pay | Admitting: Internal Medicine

## 2018-10-13 ENCOUNTER — Telehealth: Payer: Self-pay

## 2018-10-13 NOTE — Telephone Encounter (Signed)
Pt called and said that where she had her surgery done for her aneurysm that she has a new knot to come up that is bothering her and she would like to get in to have someone take a look at it. It is concerning her a lot.   Appt made for pt to be seen next week.   York Cerise, CMA

## 2018-10-17 ENCOUNTER — Other Ambulatory Visit: Payer: Self-pay

## 2018-10-17 ENCOUNTER — Ambulatory Visit (INDEPENDENT_AMBULATORY_CARE_PROVIDER_SITE_OTHER): Payer: Medicare PPO | Admitting: Physician Assistant

## 2018-10-17 VITALS — BP 169/77 | HR 74 | Temp 98.0°F | Resp 12 | Ht 65.0 in | Wt 162.6 lb

## 2018-10-17 DIAGNOSIS — Z95828 Presence of other vascular implants and grafts: Secondary | ICD-10-CM | POA: Diagnosis not present

## 2018-10-17 DIAGNOSIS — K432 Incisional hernia without obstruction or gangrene: Secondary | ICD-10-CM | POA: Diagnosis not present

## 2018-10-17 NOTE — Progress Notes (Signed)
HISTORY AND PHYSICAL     CC:  follow up Requesting Provider:  Corwin LevinsJohn, James W, MD  HPI: This is a 73 y.o. female who presented to the hospital in January 2020 with an 8cm infrarenal AAA with contained rupture.  She was taken to the OR emergently.  She had an extended post operative course complicated by afib and was subsequently started on Eliquis.  She also had an ileus/sbo.   General surgery was consulted.  Her bowel function finally returned and was tolerating diet.  A few days later, she started having N/V and the NGT was placed and this improved.  General surgery was re-consulted.  She had a BM and xray was improved and abdomen softer.  Her NGT was clamped and eventually removed.  She has required diuresis as she had anasarca that did improve.   She was discharged home on POD 32.    She did have some serous drainage from the inferior pole of the incision but no evidence of dehiscence.    The pt is here for follow up.  She states that she has a bulge beside her incision that is more prominent when she coughs or strains.  She states that she has occasional sharp shooting pains but they are gone as fast as they come.  She does c/o some numbness in the left thigh down to her knee.  This does not bother her.  She states that her energy level is still not what it was before surgery but has improved.  Her appetite is good.    She states that her oldest son did get u/s for AAA.  Her younger son is [id number] and not yet scanned.   The pt is on a statin for cholesterol management.  The pt is not on a daily aspirin.   Other AC:  Eliquis The pt is on ACEI and CCB for hypertension.   The pt is not diabetic.   Tobacco hx:  Remote.  She has not smoked since leaving the hospital.    Past Medical History:  Diagnosis Date  . BACK PAIN 03/14/2009  . COPD 11/01/2006  . GLUCOSE INTOLERANCE 03/14/2009  . Headache(784.0) 03/14/2009  . HYPERTENSION 11/01/2006  . 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 INSOMNIA,  HX OF 01/06/2007  . Other and unspecified hyperlipidemia 08/22/2013  . Urinary frequency 03/14/2009    Past Surgical History:  Procedure Laterality Date  . ABDOMINAL AORTIC ANEURYSM REPAIR N/A 04/28/2018   Procedure: ANEURYSM ABDOMINAL AORTIC REPAIR WITH HEMASHIELD GOLD VASCULAR GRAFT 22X11 mm;  Surgeon: Larina EarthlyEarly, Todd F, MD;  [facility];  Service: Vascular;  Laterality: N/A;  . CARDIAC CATHETERIZATION  2003   negative  . DILATION AND CURETTAGE OF UTERUS    . TONSILLECTOMY    . TUBAL LIGATION      Allergies  Allergen Reactions  . Hydrochlorothiazide Other (See Comments)    unknown    Current Outpatient Medications  Medication Sig Dispense Refill  . amLODipine (NORVASC) 10 MG tablet Take 1 tablet (10 mg total) by mouth daily. 90 tablet 3  . apixaban (ELIQUIS) 5 MG TABS tablet Take 1 tablet (5 mg total) by mouth 2 (two) times daily. 60 tablet 6  . fluticasone (FLONASE) 50 MCG/ACT nasal spray Place 1 spray into both nostrils as needed for allergies or rhinitis.    Marland Kitchen. levalbuterol (XOPENEX) 1.25 MG/0.5ML nebulizer solution Take 1.25 mg by nebulization every 6 (six) hours as needed for wheezing or shortness of breath. 270 each 1  .  lisinopril (ZESTRIL) 20 MG tablet Take 1 tablet (20 mg total) by mouth 2 (two) times a day. 180 tablet 3  . metoprolol tartrate (LOPRESSOR) 50 MG tablet TAKE 1 TABLET TWICE DAILY 180 tablet 1  . potassium chloride (K-DUR) 10 MEQ tablet Take 10 mEq by mouth as needed.    . pravastatin (PRAVACHOL) 20 MG tablet Take 1 tablet (20 mg total) by mouth daily. 90 tablet 3  . temazepam (RESTORIL) 15 MG capsule Take 1 capsule (15 mg total) by mouth at bedtime as needed for sleep. 30 capsule 5  . Vitamin D, Ergocalciferol, (DRISDOL) 1.25 MG (50000 UT) CAPS capsule Take 1 capsule (50,000 Units total) by mouth every 7 (seven) days. 12 capsule 0   No current facility-administered medications for this visit.     Family History  Problem Relation Age of Onset  . Coronary  artery disease Other        female 1st degree relative <60 and female 1st degree relative <50  . Diabetes Father   . Heart attack Father   . Diabetes Mother   . Hypertension Mother   . Congestive Heart Failure Mother   . Colon cancer Neg Hx   . Breast cancer Neg Hx     Social History   Socioeconomic History  . Marital status: Widowed    Spouse name: Not on file  . Number of children: Not on file  . Years of education: Not on file  . Highest education level: Not on file  Occupational History  . Not on file  Social Needs  . Financial resource strain: Not on file  . Food insecurity    Worry: Not on file    Inability: Not on file  . Transportation needs    Medical: Not on file    Non-medical: Not on file  Tobacco Use  . Smoking status: Former Smoker    Types: Cigarettes  . Smokeless tobacco: Never Used  . Tobacco comment: 2 packs per week  Substance and Sexual Activity  . Alcohol use: No  . Drug use: No  . Sexual activity: Not Currently    Birth control/protection: Surgical    Comment: BTL  Lifestyle  . Physical activity    Days per week: Not on file    Minutes per session: Not on file  . Stress: Not on file  Relationships  . Social Herbalist on phone: Not on file    Gets together: Not on file    Attends religious service: Not on file    Active member of club or organization: Not on file    Attends meetings of clubs or organizations: Not on file    Relationship status: Not on file  . Intimate partner violence    Fear of current or ex partner: Not on file    Emotionally abused: Not on file    Physically abused: Not on file    Forced sexual activity: Not on file  Other Topics Concern  . Not on file  Social History Narrative  . Not on file     REVIEW OF SYSTEMS:   [X]  denotes positive finding, [ ]  denotes negative finding Cardiac  Comments:  Chest pain or chest pressure:    Shortness of breath upon exertion:    Short of breath when lying flat:     Irregular heart rhythm:        Vascular    Pain in calf, thigh, or hip brought on by ambulation: x  Numbness in left thigh down to knee  Pain in feet at night that wakes you up from your sleep:     Blood clot in your veins:    Leg swelling:         Pulmonary    Oxygen at home:    Productive cough:     Wheezing:         Neurologic    Sudden weakness in arms or legs:     Sudden numbness in arms or legs:     Sudden onset of difficulty speaking or slurred speech:    Temporary loss of vision in one eye:     Problems with dizziness:         Gastrointestinal    Blood in stool:     Vomited blood:         Genitourinary    Burning when urinating:     Blood in urine:        Psychiatric    Major depression:     Anxiety x   Hematologic    Bleeding problems:    Problems with blood clotting too easily:        Skin    Rashes or ulcers:        Constitutional    Fever or chills:      PHYSICAL EXAMINATION:  Today's Vitals   10/17/18 1307 10/17/18 1310  BP: (!) 169/77   Pulse: 74   Resp: 12   Temp: 98 F (36.7 C)   TempSrc: Temporal   SpO2: 98%   Weight: 162 lb 9.6 oz (73.8 kg)   Height: 5\' 5"  (1.651 m)   PainSc:  3    Body mass index is 27.06 kg/m.   General:  WDWN in NAD; vital signs documented above Gait: Normal HENT: WNL, normocephalic Pulmonary: normal non-labored breathing  Cardiac: regular HR Skin: without rashes Vascular Exam/Pulses:  Right Left  Radial 2+ (normal) 2+ (normal)  AT 1+ (weak) 2+ (normal)  PT 1+ (weak) 1+ (weak)   Extremities: without ischemic changes, without Gangrene , without cellulitis; without open wounds;  Abdomen:  Laparotomy incision has healed nicely; she does have a small bulge on the right side of her incision midway with cough/strain Musculoskeletal: no muscle wasting or atrophy  Neurologic: A&O X 3;  No focal weakness or paresthesias are detected Psychiatric:  The pt has Normal affect.   Non-Invasive Vascular Imaging:    None today    ASSESSMENT/PLAN:: 73 y.o. female here for follow up for rupture AAA repair in January 2020 by Dr. Arbie CookeyEarly for c/o bulge on abdominal wall   -pt seen and examined with Dr. Arbie CookeyEarly.  Most likely has a small incisional ventral hernia on the right side.  Dr. Arbie CookeyEarly discussed with her that if it did get bigger, she should see general surgery about incisional hernia repair.  He did discuss with her also, should it become painful, she may have an incarcerated hernia and should proceed to ER.  She also knows if she develops N/V/pain in the future that she may have a sbo and would need to go to the ER.   -she does have palpable pulses in her feet.  Unsure about the numbness in her left thigh.  She states this has been unchanged since surgery.   -she has not smoked since she left the hospital.  She states she does have some tough days where she wants to smoke, but doesn't.  Gave her great praise for not smoking  since leaving hospital.  -she will f/u here as needed.    Doreatha MassedSamantha Julane Crock, PA-C [facility] [phone]  Clinic MD:   Early

## 2018-10-26 ENCOUNTER — Other Ambulatory Visit: Payer: Self-pay | Admitting: Cardiology

## 2018-10-26 NOTE — Telephone Encounter (Signed)
At the front desk  Pt notified

## 2018-10-26 NOTE — Telephone Encounter (Signed)
Patient calling the office for samples of medication:   1.  What medication and dosage are you requesting samples for? Eliquis 5 mg   2.  Are you currently out of this medication? Patient has 2 left.

## 2018-11-26 ENCOUNTER — Other Ambulatory Visit: Payer: Self-pay | Admitting: Internal Medicine

## 2018-11-29 ENCOUNTER — Telehealth: Payer: Self-pay | Admitting: Cardiology

## 2018-11-29 NOTE — Telephone Encounter (Signed)
No samples- pt notified she will Cb later

## 2018-11-29 NOTE — Telephone Encounter (Signed)
New message ° ° °Patient calling the office for samples of medication: ° ° °1.  What medication and dosage are you requesting samples for?apixaban (ELIQUIS) 5 MG TABS tablet ° °2.  Are you currently out of this medication? No  ° ° °

## 2018-12-05 ENCOUNTER — Telehealth: Payer: Self-pay | Admitting: Cardiology

## 2018-12-05 ENCOUNTER — Other Ambulatory Visit: Payer: Self-pay | Admitting: Obstetrics & Gynecology

## 2018-12-05 DIAGNOSIS — Z1231 Encounter for screening mammogram for malignant neoplasm of breast: Secondary | ICD-10-CM

## 2018-12-05 NOTE — Telephone Encounter (Signed)
  Patient calling the office for samples of medication:   1.  What medication and dosage are you requesting samples for? apixaban (ELIQUIS) 5 MG TABS tablet  2.  Are you currently out of this medication? Not quite

## 2018-12-05 NOTE — Telephone Encounter (Signed)
Spoke to patient  No samples available

## 2018-12-14 ENCOUNTER — Telehealth: Payer: Self-pay | Admitting: Cardiology

## 2018-12-14 ENCOUNTER — Other Ambulatory Visit: Payer: Self-pay | Admitting: Internal Medicine

## 2018-12-14 NOTE — Telephone Encounter (Signed)
New Message    Patient states she dropped off papers to be filled out and calling back to check on the status.

## 2018-12-14 NOTE — Telephone Encounter (Signed)
Spoke with pt and informed Eliquis patient assistance form has been faxed to [employer] today 9/17. Pt voiced understanding.

## 2018-12-14 NOTE — Telephone Encounter (Signed)
Done erx 

## 2018-12-18 NOTE — Telephone Encounter (Signed)
Patient assistance for Eliquis has been approved from 12/15/18 through 03/29/19. Patient notified.

## 2019-01-19 ENCOUNTER — Ambulatory Visit
Admission: RE | Admit: 2019-01-19 | Discharge: 2019-01-19 | Disposition: A | Discharge status: Home / Self Care | Payer: Medicare PPO | Source: Ambulatory Visit | Attending: Obstetrics & Gynecology | Admitting: Obstetrics & Gynecology

## 2019-01-19 ENCOUNTER — Other Ambulatory Visit: Payer: Self-pay

## 2019-01-19 DIAGNOSIS — Z1231 Encounter for screening mammogram for malignant neoplasm of breast: Secondary | ICD-10-CM

## 2019-01-20 ENCOUNTER — Ambulatory Visit (INDEPENDENT_AMBULATORY_CARE_PROVIDER_SITE_OTHER): Payer: Medicare PPO

## 2019-01-20 DIAGNOSIS — Z23 Encounter for immunization: Secondary | ICD-10-CM | POA: Diagnosis not present

## 2019-02-12 ENCOUNTER — Ambulatory Visit (INDEPENDENT_AMBULATORY_CARE_PROVIDER_SITE_OTHER): Payer: Medicare PPO | Admitting: Internal Medicine

## 2019-02-12 ENCOUNTER — Encounter: Payer: Self-pay | Admitting: Internal Medicine

## 2019-02-12 DIAGNOSIS — J441 Chronic obstructive pulmonary disease with (acute) exacerbation: Secondary | ICD-10-CM

## 2019-02-12 DIAGNOSIS — Z20828 Contact with and (suspected) exposure to other viral communicable diseases: Secondary | ICD-10-CM | POA: Diagnosis not present

## 2019-02-12 DIAGNOSIS — Z20822 Contact with and (suspected) exposure to covid-19: Secondary | ICD-10-CM

## 2019-02-12 DIAGNOSIS — R7303 Prediabetes: Secondary | ICD-10-CM | POA: Diagnosis not present

## 2019-02-12 MED ORDER — PREDNISONE 10 MG PO TABS: ORAL_TABLET | ORAL | 0 refills | Status: DC

## 2019-02-12 MED ORDER — LEVOFLOXACIN 500 MG PO TABS: 500.0000 mg | ORAL_TABLET | Freq: Every day | ORAL | 0 refills | Status: AC

## 2019-02-12 MED ORDER — HYDROCODONE-HOMATROPINE 5-1.5 MG/5ML PO SYRP: 5.0000 mL | ORAL_SOLUTION | Freq: Four times a day (QID) | ORAL | 0 refills | Status: AC | PRN

## 2019-02-12 NOTE — Progress Notes (Signed)
Patient ID: Leah Mata, female   DOB: 02-May-1945, 73 y.o.   MRN: [id number]  Phone visit  Cumulative time during 7-day interval 13 min, there was not an associated office visit for this concern within a 7 day period.  Verbal consent for services obtained from patient prior to services given.  Names of all persons present for services: Cathlean Cower, MD, patient  Chief complaint: sob  History, background, results pertinent:  Here with acute onset mild to mod 2-3 days ST, HA, general weakness and malaise, with prod cough greenish sputum, but Pt denies chest pain, increased sob or doe, wheezing, orthopnea, PND, increased LE swelling, palpitations, dizziness or syncope, except for onset mild wheezing/sob since last PM  Past Medical History:  Diagnosis Date  . BACK PAIN 03/14/2009  . COPD 11/01/2006  . GLUCOSE INTOLERANCE 03/14/2009  . Headache(784.0) 03/14/2009  . HYPERTENSION 11/01/2006  . 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 INSOMNIA, HX OF 01/06/2007  . Other and unspecified hyperlipidemia 08/22/2013  . Urinary frequency 03/14/2009   No results found for this or any previous visit (from the past 48 hour(s)).  Lab Results  Component Value Date   WBC 8.7 09/06/2018   HGB 13.1 09/06/2018   HCT 39.2 09/06/2018   PLT 205.0 09/06/2018   GLUCOSE 99 09/06/2018   CHOL 139 09/06/2018   TRIG 105.0 09/06/2018   HDL 48.20 09/06/2018   LDLDIRECT 84.1 01/31/2014   LDLCALC 70 09/06/2018   ALT 11 09/06/2018   AST 15 09/06/2018   NA 135 09/06/2018   K 4.3 09/06/2018   CL 102 09/06/2018   CREATININE 0.98 09/06/2018   BUN 18 09/06/2018   CO2 28 09/06/2018   TSH 0.87 09/06/2018   INR 1.16 04/30/2018   HGBA1C 6.1 09/06/2018    A/P/next steps:   1)  COPD exacerbation  - for antibx, cough med prn, and predpac, cont inhalers, and check COVID  Cathlean Cower MD

## 2019-02-12 NOTE — Assessment & Plan Note (Signed)
See notes

## 2019-02-12 NOTE — Assessment & Plan Note (Signed)
stable overall by history and exam, recent data reviewed with pt, and pt to continue medical treatment as before,  to f/u any worsening symptoms or concerns  

## 2019-02-12 NOTE — Patient Instructions (Signed)
Please take all new medication as prescribed - the antibiotic, cough medicine, and prednisone  Please continue all other medications as before, and refills have been done if requested.  Please have the pharmacy call with any other refills you may need.  Please continue your efforts at being more active, low cholesterol diet, and weight control..  Please keep your appointments with your specialists as you may have planned  Please go for the COVID testing tomorrow

## 2019-02-16 DIAGNOSIS — Z20828 Contact with and (suspected) exposure to other viral communicable diseases: Secondary | ICD-10-CM | POA: Diagnosis not present

## 2019-03-08 ENCOUNTER — Ambulatory Visit (INDEPENDENT_AMBULATORY_CARE_PROVIDER_SITE_OTHER): Payer: Medicare PPO | Admitting: Internal Medicine

## 2019-03-08 ENCOUNTER — Encounter: Payer: Self-pay | Admitting: Internal Medicine

## 2019-03-08 ENCOUNTER — Other Ambulatory Visit: Payer: Self-pay

## 2019-03-08 VITALS — BP 120/78 | HR 82 | Temp 97.8°F | Ht 65.0 in | Wt 163.0 lb

## 2019-03-08 DIAGNOSIS — F419 Anxiety disorder, unspecified: Secondary | ICD-10-CM | POA: Diagnosis not present

## 2019-03-08 DIAGNOSIS — R7303 Prediabetes: Secondary | ICD-10-CM

## 2019-03-08 DIAGNOSIS — F329 Major depressive disorder, single episode, unspecified: Secondary | ICD-10-CM | POA: Diagnosis not present

## 2019-03-08 DIAGNOSIS — Z0000 Encounter for general adult medical examination without abnormal findings: Secondary | ICD-10-CM

## 2019-03-08 DIAGNOSIS — J449 Chronic obstructive pulmonary disease, unspecified: Secondary | ICD-10-CM | POA: Diagnosis not present

## 2019-03-08 DIAGNOSIS — E559 Vitamin D deficiency, unspecified: Secondary | ICD-10-CM | POA: Diagnosis not present

## 2019-03-08 DIAGNOSIS — F32A Depression, unspecified: Secondary | ICD-10-CM | POA: Insufficient documentation

## 2019-03-08 MED ORDER — METOPROLOL TARTRATE 50 MG PO TABS: 50.0000 mg | ORAL_TABLET | Freq: Two times a day (BID) | ORAL | 1 refills | Status: DC

## 2019-03-08 MED ORDER — POTASSIUM CHLORIDE ER 10 MEQ PO TBCR: 10.0000 meq | EXTENDED_RELEASE_TABLET | ORAL | 3 refills | Status: DC | PRN

## 2019-03-08 MED ORDER — CITALOPRAM HYDROBROMIDE 10 MG PO TABS: 10.0000 mg | ORAL_TABLET | Freq: Every day | ORAL | 3 refills | Status: DC

## 2019-03-08 MED ORDER — PRAVASTATIN SODIUM 20 MG PO TABS: 20.0000 mg | ORAL_TABLET | Freq: Every day | ORAL | 3 refills | Status: DC

## 2019-03-08 MED ORDER — AMLODIPINE BESYLATE 10 MG PO TABS: 10.0000 mg | ORAL_TABLET | Freq: Every day | ORAL | 3 refills | Status: DC

## 2019-03-08 MED ORDER — ALPRAZOLAM 0.25 MG PO TABS: 0.2500 mg | ORAL_TABLET | Freq: Two times a day (BID) | ORAL | 2 refills | Status: DC | PRN

## 2019-03-08 NOTE — Assessment & Plan Note (Signed)
[place] for xanax prn,  to f/u any worsening symptoms or concerns

## 2019-03-08 NOTE — Assessment & Plan Note (Addendum)
stable overall by history and exam, recent data reviewed with pt, and pt to continue medical treatment as before,  to f/u any worsening symptoms or concerns  Note:  Total time for pt hx, exam, review of record with pt in the room, determination of diagnoses and plan for further eval and tx is > 40 min, with over 50% spent in coordination and counseling of patient including the differential dx, tx, further evaluation and other management of copd, hyperglycemia, anxieyt, depression, vit d deficiency

## 2019-03-08 NOTE — Progress Notes (Signed)
Subjective:    Patient ID: Leah Mata, female    DOB: 1945/04/15, 73 y.o.   MRN: [id number]  HPI  Here to f/u; overall doing ok,  Pt denies chest pain, increasing sob or doe, wheezing, orthopnea, PND, increased LE swelling, palpitations, dizziness or syncope.  Pt denies new neurological symptoms such as new headache, or facial or extremity weakness or numbness.  Pt denies polydipsia, polyuria, or low sugar episode.  Pt states overall good compliance with meds, mostly trying to follow appropriate diet, with wt overall stable,  Recent covid infection now resolved but still tired.  Also c/o More nervous and depressed, Denies suicidal ideation, or panic; has ongoing anxiety. Has been taking the vit d . Past Medical History:  Diagnosis Date  . BACK PAIN 03/14/2009  . COPD 11/01/2006  . GLUCOSE INTOLERANCE 03/14/2009  . Headache(784.0) 03/14/2009  . HYPERTENSION 11/01/2006  . 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 INSOMNIA, HX OF 01/06/2007  . Other and unspecified hyperlipidemia 08/22/2013  . Urinary frequency 03/14/2009   Past Surgical History:  Procedure Laterality Date  . ABDOMINAL AORTIC ANEURYSM REPAIR N/A 04/28/2018   Procedure: ANEURYSM ABDOMINAL AORTIC REPAIR WITH HEMASHIELD GOLD VASCULAR GRAFT 22X11 mm;  Surgeon: Rosetta Posner, MD;  Location: [facility];  Service: Vascular;  Laterality: N/A;  . CARDIAC CATHETERIZATION  2003   negative  . DILATION AND CURETTAGE OF UTERUS    . TONSILLECTOMY    . TUBAL LIGATION      reports that she has quit smoking. Her smoking use included cigarettes. She has never used smokeless tobacco. She reports that she does not drink alcohol or use drugs. family history includes Congestive Heart Failure in her mother; Coronary artery disease in an other family member; Diabetes in her father and mother; Heart attack in her father; Hypertension in her mother. Allergies  Allergen Reactions  . Hydrochlorothiazide Other (See Comments)    unknown   Current  Outpatient Medications on File Prior to Visit  Medication Sig Dispense Refill  . apixaban (ELIQUIS) 5 MG TABS tablet Take 1 tablet (5 mg total) by mouth 2 (two) times daily. 60 tablet 6  . fluticasone (FLONASE) 50 MCG/ACT nasal spray Place 1 spray into both nostrils as needed for allergies or rhinitis.    Marland Kitchen levalbuterol (XOPENEX) 1.25 MG/0.5ML nebulizer solution Take 1.25 mg by nebulization every 6 (six) hours as needed for wheezing or shortness of breath. 270 each 1  . lisinopril (ZESTRIL) 20 MG tablet Take 1 tablet (20 mg total) by mouth 2 (two) times a day. 180 tablet 3  . predniSONE (DELTASONE) 10 MG tablet 3 tabs by mouth per day for 3 days,2tabs per day for 3 days,1tab per day for 3 days 18 tablet 0  . temazepam (RESTORIL) 15 MG capsule TAKE 1 CAPSULE BY MOUTH AT BEDTIME AS NEEDED FOR SLEEP 30 capsule 5  . Vitamin D, Ergocalciferol, (DRISDOL) 1.25 MG (50000 UT) CAPS capsule Take 1 capsule (50,000 Units total) by mouth every 7 (seven) days. 12 capsule 0   No current facility-administered medications on file prior to visit.   Review of Systems  Constitutional: Negative for other unusual diaphoresis or sweats HENT: Negative for ear discharge or swelling Eyes: Negative for other worsening visual disturbances Respiratory: Negative for stridor or other swelling  Gastrointestinal: Negative for worsening distension or other blood Genitourinary: Negative for retention or other urinary change Musculoskeletal: Negative for other MSK pain or swelling Skin: Negative for color change or other new lesions  Neurological: Negative for worsening tremors and other numbness  Psychiatric/Behavioral: Negative for worsening agitation or other fatigue All otherwise neg per pt     Objective:   Physical Exam BP 120/78   Pulse 82   Temp 97.8 F (36.6 C) (Oral)   Ht 5\' 5"  (1.651 m)   Wt 163 lb (73.9 kg)   LMP 03/29/1993 (Approximate)   SpO2 97%   BMI 27.12 kg/m  VS noted,  Constitutional: Pt appears  in NAD HENT: Head: NCAT.  Right Ear: External ear normal.  Left Ear: External ear normal.  Eyes: . Pupils are equal, round, and reactive to light. Conjunctivae and EOM are normal Nose: without d/c or deformity Neck: Neck supple. Gross normal ROM Cardiovascular: Normal rate and regular rhythm.   Pulmonary/Chest: Effort normal and breath sounds without rales or wheezing.  Abd:  Soft, NT, ND, + BS, no organomegaly Neurological: Pt is alert. At baseline orientation, motor grossly intact Skin: Skin is warm. No rashes, other new lesions, no LE edema Psychiatric: Pt behavior is normal without agitation . With depressed nervous affect and mood All otherwise neg per pt Lab Results  Component Value Date   WBC 8.7 09/06/2018   HGB 13.1 09/06/2018   HCT 39.2 09/06/2018   PLT 205.0 09/06/2018   GLUCOSE 99 09/06/2018   CHOL 139 09/06/2018   TRIG 105.0 09/06/2018   HDL 48.20 09/06/2018   LDLDIRECT 84.1 01/31/2014   LDLCALC 70 09/06/2018   ALT 11 09/06/2018   AST 15 09/06/2018   NA 135 09/06/2018   K 4.3 09/06/2018   CL 102 09/06/2018   CREATININE 0.98 09/06/2018   BUN 18 09/06/2018   CO2 28 09/06/2018   TSH 0.87 09/06/2018   INR 1.16 04/30/2018   HGBA1C 6.1 09/06/2018    Declines lab work today    Assessment & Plan:

## 2019-03-08 NOTE — Patient Instructions (Signed)
Please take all new medication as prescribed - the xanax (to [facility])  Please take all new medication as prescribed - the celexa 10 mg (to [facility])  Please continue all other medications as before, and refills have been done if requested.  Please have the pharmacy call with any other refills you may need.  Please continue your efforts at being more active, low cholesterol diet, and weight control  Please keep your appointments with your specialists as you may have planned  Please return in 6 months, or sooner if needed, with Lab testing done 3-5 days before

## 2019-03-08 NOTE — Assessment & Plan Note (Signed)
To continue oral replacement 

## 2019-03-08 NOTE — Assessment & Plan Note (Signed)
Mild to mod, for celexa 10 qd,  to f/u any worsening symptoms or concerns 

## 2019-03-08 NOTE — Assessment & Plan Note (Signed)
stable overall by history and exam, recent data reviewed with pt, and pt to continue medical treatment as before,  to f/u any worsening symptoms or concerns  

## 2019-03-09 ENCOUNTER — Ambulatory Visit: Payer: Medicare PPO | Admitting: Cardiology

## 2019-03-09 ENCOUNTER — Encounter: Payer: Self-pay | Admitting: Cardiology

## 2019-03-09 ENCOUNTER — Other Ambulatory Visit: Payer: Self-pay

## 2019-03-09 VITALS — BP 122/62 | HR 86 | Ht 66.0 in | Wt 167.4 lb

## 2019-03-09 DIAGNOSIS — I5032 Chronic diastolic (congestive) heart failure: Secondary | ICD-10-CM | POA: Diagnosis not present

## 2019-03-09 DIAGNOSIS — I480 Paroxysmal atrial fibrillation: Secondary | ICD-10-CM | POA: Diagnosis not present

## 2019-03-09 DIAGNOSIS — Z7189 Other specified counseling: Secondary | ICD-10-CM

## 2019-03-09 DIAGNOSIS — Z87891 Personal history of nicotine dependence: Secondary | ICD-10-CM

## 2019-03-09 DIAGNOSIS — I10 Essential (primary) hypertension: Secondary | ICD-10-CM

## 2019-03-09 DIAGNOSIS — I714 Abdominal aortic aneurysm, without rupture: Secondary | ICD-10-CM | POA: Diagnosis not present

## 2019-03-09 DIAGNOSIS — Z8616 Personal history of COVID-19: Secondary | ICD-10-CM | POA: Insufficient documentation

## 2019-03-09 DIAGNOSIS — Z7901 Long term (current) use of anticoagulants: Secondary | ICD-10-CM

## 2019-03-09 DIAGNOSIS — I4892 Unspecified atrial flutter: Secondary | ICD-10-CM

## 2019-03-09 DIAGNOSIS — I110 Hypertensive heart disease with heart failure: Secondary | ICD-10-CM

## 2019-03-09 DIAGNOSIS — Z8619 Personal history of other infectious and parasitic diseases: Secondary | ICD-10-CM | POA: Diagnosis not present

## 2019-03-09 DIAGNOSIS — E7800 Pure hypercholesterolemia, unspecified: Secondary | ICD-10-CM | POA: Diagnosis not present

## 2019-03-09 DIAGNOSIS — Z9889 Other specified postprocedural states: Secondary | ICD-10-CM

## 2019-03-09 NOTE — Progress Notes (Signed)
Cardiology Office Note:    Date:  03/09/2019   ID:  PRIM Leah Mata, DOB 1945-09-17, MRN [id number]  PCP:  Corwin Levins, MD  Cardiologist:  Jodelle Red, MD  Referring MD: Corwin Levins, MD   CC: follow up.   History of Present Illness:    Leah Mata is a 73 y.o. female with a hx of hypertension, hyperlipidemia, AAAwithruptured abdominal aortic aneurysm with surgery on 04/29/2018.She had a >30 day admission, with discharge on 05/30/18.  Today:  Not checking BP routinely at home recently. Has been under a lot of stress, started on a nerve pill yesterday (appears to be Xanax) by Dr. Jonny Ruiz. Took last night, helped her sleep. Looks like this was xanax.  Had Covid, diagnosed 02/13/19, struggled for several weeks until just recently getting back to normal. No one in her family had it, and she has been very careful. Always wears a mask, rarely goes out except for groceries.  Denies chest pain, shortness of breath at rest or with normal exertion. No PND, orthopnea, LE edema or unexpected weight gain. No syncope or palpitations. Has right shoulder pain for the last few days, changes with movement.   Med rec done today. Has not needed fluid pills or potassium, reviewed.  Past Medical History:  Diagnosis Date  . BACK PAIN 03/14/2009  . COPD 11/01/2006  . GLUCOSE INTOLERANCE 03/14/2009  . Headache(784.0) 03/14/2009  . HYPERTENSION 11/01/2006  . 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 INSOMNIA, HX OF 01/06/2007  . Other and unspecified hyperlipidemia 08/22/2013  . Urinary frequency 03/14/2009    Past Surgical History:  Procedure Laterality Date  . ABDOMINAL AORTIC ANEURYSM REPAIR N/A 04/28/2018   Procedure: ANEURYSM ABDOMINAL AORTIC REPAIR WITH HEMASHIELD GOLD VASCULAR GRAFT 22X11 mm;  Surgeon: Larina Earthly, MD;  Location: [facility];  Service: Vascular;  Laterality: N/A;  . CARDIAC CATHETERIZATION  2003   negative  . DILATION AND CURETTAGE OF UTERUS    . TONSILLECTOMY    . TUBAL  LIGATION      Current Medications: Current Outpatient Medications on File Prior to Visit  Medication Sig  . ALPRAZolam (XANAX) 0.25 MG tablet Take 1 tablet (0.25 mg total) by mouth 2 (two) times daily as needed for anxiety.  Marland Kitchen amLODipine (NORVASC) 10 MG tablet Take 1 tablet (10 mg total) by mouth daily.  Marland Kitchen apixaban (ELIQUIS) 5 MG TABS tablet Take 1 tablet (5 mg total) by mouth 2 (two) times daily.  . citalopram (CELEXA) 10 MG tablet Take 1 tablet (10 mg total) by mouth daily.  . fluticasone (FLONASE) 50 MCG/ACT nasal spray Place 1 spray into both nostrils as needed for allergies or rhinitis.  Marland Kitchen levalbuterol (XOPENEX) 1.25 MG/0.5ML nebulizer solution Take 1.25 mg by nebulization every 6 (six) hours as needed for wheezing or shortness of breath.  . lisinopril (ZESTRIL) 20 MG tablet Take 1 tablet (20 mg total) by mouth 2 (two) times a day.  . metoprolol tartrate (LOPRESSOR) 50 MG tablet Take 1 tablet (50 mg total) by mouth 2 (two) times daily.  . pravastatin (PRAVACHOL) 20 MG tablet Take 1 tablet (20 mg total) by mouth daily.  . Vitamin D, Ergocalciferol, (DRISDOL) 1.25 MG (50000 UT) CAPS capsule Take 1 capsule (50,000 Units total) by mouth every 7 (seven) days.   No current facility-administered medications on file prior to visit.     Allergies:   Hydrochlorothiazide   Social History   Tobacco Use  . Smoking status: Former Smoker  Types: Cigarettes  . Smokeless tobacco: Never Used  . Tobacco comment: 2 packs per week  Substance Use Topics  . Alcohol use: No  . Drug use: No    Family History: family history includes Congestive Heart Failure in her mother; Coronary artery disease in an other family member; Diabetes in her father and mother; Heart attack in her father; Hypertension in her mother. There is no history of Colon cancer or Breast cancer.  ROS:   Please see the history of present illness.  Additional pertinent ROS: Constitutional: Negative for chills, fever, night  sweats, unintentional weight loss  HENT: Negative for ear pain and hearing loss.   Eyes: Negative for loss of vision and eye pain.  Respiratory: Negative for cough, sputum, wheezing.   Cardiovascular: See HPI. Gastrointestinal: Negative for abdominal pain, melena, and hematochezia.  Genitourinary: Negative for dysuria and hematuria.  Musculoskeletal: Negative for falls and myalgias.  Skin: Negative for itching and rash.  Neurological: Negative for focal weakness, focal sensory changes and loss of consciousness.  Endo/Heme/Allergies: Does not bruise/bleed easily.     EKGs/Labs/Other Studies Reviewed:    The following studies were reviewed today: Echo 04/2018  EKG:  EKG is personally reviewed.  The ekg ordered 06/17/18 demonstrates NSR  Recent Labs: 05/15/2018: Magnesium 1.8 06/17/2018: B Natriuretic Peptide 340.2 09/06/2018: ALT 11; BUN 18; Creatinine, Ser 0.98; Hemoglobin 13.1; Platelets 205.0; Potassium 4.3; Sodium 135; TSH 0.87  Recent Lipid Panel    Component Value Date/Time   CHOL 139 09/06/2018 1417   TRIG 105.0 09/06/2018 1417   HDL 48.20 09/06/2018 1417   CHOLHDL 3 09/06/2018 1417   VLDL 21.0 09/06/2018 1417   LDLCALC 70 09/06/2018 1417   LDLDIRECT 84.1 01/31/2014 1143    Physical Exam:    VS:  BP 122/62 (BP Location: Right Arm)   Pulse 86   Ht 5\' 6"  (1.676 m)   Wt 167 lb 6.4 oz (75.9 kg)   LMP 03/29/1993 (Approximate)   SpO2 97%   BMI 27.02 kg/m     Wt Readings from Last 3 Encounters:  03/09/19 167 lb 6.4 oz (75.9 kg)  03/08/19 163 lb (73.9 kg)  10/17/18 162 lb 9.6 oz (73.8 kg)    GEN: Well nourished, well developed in no acute distress HEENT: Normal, moist mucous membranes NECK: No JVD CARDIAC: largely regular with occasional premature beats, normal S1 and S2, no rubs or gallops. No murmurs. VASCULAR: Radial and DP pulses 2+ bilaterally. No carotid bruits RESPIRATORY:  Clear to auscultation without rales, wheezing or rhonchi  ABDOMEN: Soft, non-tender,  non-distended. Midline abdominal scar with large ventral hernia on R side, no sign of incarcerated bowel.  MUSCULOSKELETAL:  Ambulates independently SKIN: Warm and dry, no edema NEUROLOGIC:  Alert and oriented x 3. No focal neuro deficits noted. PSYCHIATRIC:  Normal affect    ASSESSMENT:    1. Essential hypertension   2. History of AAA (abdominal aortic aneurysm) repair   3. Paroxysmal atrial fibrillation (HCC)   4. Atrial flutter, paroxysmal ([place])   5. Pure hypercholesterolemia   6. Chronic diastolic heart failure (HCC)   7. Cardiac risk counseling   8. Counseling on health promotion and disease prevention   9. History of 2019 novel coronavirus disease (COVID-19)    PLAN:    Hypertension: well controlled today, at goal -continue amlodipine 10 mg daily -continue lisinopril 20 mg BID (patient preference for BID, ok by me, want 40 mg total daily dose) -currently on metoprolol 50 mg BID. Next change  will be to change this to carvedilol if more BP control needed -discussed red flag symptoms that need immediate medical attention  Paroxysmal atrial fib and flutter: CHA2DS2/VAS Stroke Risk Points=4 -in NSRon most recent ECG, denies palpitations or tachycardia -continue apixaban 5 mg BID -on metoprolol for rate control -likely prior tobacco history/lung disease exacerbated her post op arrhythmia. Now tobacco free.  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 Quit smoking and has remained abstinent.  Chronic diastolic dysfunction:now euvolemic, weight stable off furosemide/potassium. Denies any change in symptoms. Removed lasix/potassium from med list as she has not used in a very long time.  Hypercholesterolemia: with PAD, LDL goal <70.  -last LDL 70 09/06/18 -continue pravastatin 20 mg, though ideally given her ASCVD would like a higher intensity statin. However, she tolerates this dose, so this is a reasonable  compromise.  Recent covid infection: recovered, no current issues.  Cardiac risk counseling and prevention recommendations: -recommend heart healthy/Mediterranean diet, with whole grains, fruits, vegetable, fish, lean meats, nuts, and olive oil. Limit salt. -recommend moderate walking, 3-5 times/week for 30-50 minutes each session. Aim for at least 150 minutes.week. Goal should be pace of 3 miles/hours, or walking 1.5 miles in 30 minutes -recommend avoidance of tobacco products. Avoid excess alcohol.  Plan for follow up: 9 mos (to space between appts with Dr. [name]), then likely yearly if doing well.  TIME SPENT WITH PATIENT: 25 minutes of direct patient care. More than 50% of that time was spent on coordination of care and counseling regarding med rec, guidelines and recommendations for management of her cardiovascular disease.  Jodelle RedBridgette Sonnie Pawloski, MD, PhD [place]  [facility]   Medication Adjustments/Labs and Tests Ordered: Current medicines are reviewed at length with the patient today.  Concerns regarding medicines are outlined above.  No orders of the defined types were placed in this encounter.  No orders of the defined types were placed in this encounter.   Patient Instructions  Medication Instructions:  Your Physician recommend you continue on your current medication as directed.    *If you need a refill on your cardiac medications before your next appointment, please call your pharmacy*  Lab Work: None  Testing/Procedures: None  Follow-Up: At [facility], you and your health needs are our priority.  As part of our continuing mission to provide you with exceptional heart care, we have created designated Provider Care Teams.  These Care Teams include your primary Cardiologist (physician) and Advanced Practice Providers (APPs -  Physician Assistants and Nurse Practitioners) who all work together to provide you with the care you need, when you need it.  Your  next appointment:   9 month(s)  The format for your next appointment:   In Person  Provider:   Jodelle RedBridgette Joram Venson, MD     Signed, Jodelle RedBridgette Leotta Weingarten, MD PhD 03/09/2019 7:11 PM    [facility]

## 2019-03-09 NOTE — Patient Instructions (Signed)
Medication Instructions:  Your Physician recommend you continue on your current medication as directed.    *If you need a refill on your cardiac medications before your next appointment, please call your pharmacy*  Lab Work: None  Testing/Procedures: None  Follow-Up: At [facility], you and your health needs are our priority.  As part of our continuing mission to provide you with exceptional heart care, we have created designated Provider Care Teams.  These Care Teams include your primary Cardiologist (physician) and Advanced Practice Providers (APPs -  Physician Assistants and Nurse Practitioners) who all work together to provide you with the care you need, when you need it.  Your next appointment:   9 month(s)  The format for your next appointment:   In Person  Provider:   Buford Dresser, MD

## 2019-03-13 ENCOUNTER — Telehealth: Payer: Self-pay | Admitting: *Deleted

## 2019-03-13 NOTE — Telephone Encounter (Signed)
Please let pt know, K has been normal 3 times in the past 9 months, and she is not on diuretic, and current med list does not include potassium medication, so it seems unlikely that she needs potassium medication.  Please let me know if she has other questions  or concerns.

## 2019-03-13 NOTE — Telephone Encounter (Signed)
Rec'd fax for renewal for Potassium chloride (Klor Con) 10 meq. Med is not on mediation list. Pls advise if ok to refill.Marland KitchenJohny Chess

## 2019-03-14 NOTE — Telephone Encounter (Signed)
Pt stated she is not currently taking potassium and have been trying to tell [employer] that she doesn't need any refills. She stated she had only took it prior when she was in the hospital and was on a diuretic. She understands that potassium is not needed at this time,

## 2019-04-24 DIAGNOSIS — E559 Vitamin D deficiency, unspecified: Secondary | ICD-10-CM | POA: Diagnosis not present

## 2019-04-24 DIAGNOSIS — I10 Essential (primary) hypertension: Secondary | ICD-10-CM | POA: Diagnosis not present

## 2019-04-24 DIAGNOSIS — F419 Anxiety disorder, unspecified: Secondary | ICD-10-CM | POA: Diagnosis not present

## 2019-04-24 DIAGNOSIS — Z87891 Personal history of nicotine dependence: Secondary | ICD-10-CM | POA: Diagnosis not present

## 2019-04-24 DIAGNOSIS — I4891 Unspecified atrial fibrillation: Secondary | ICD-10-CM | POA: Diagnosis not present

## 2019-04-24 DIAGNOSIS — F324 Major depressive disorder, single episode, in partial remission: Secondary | ICD-10-CM | POA: Diagnosis not present

## 2019-04-24 DIAGNOSIS — J449 Chronic obstructive pulmonary disease, unspecified: Secondary | ICD-10-CM | POA: Diagnosis not present

## 2019-04-24 DIAGNOSIS — K5900 Constipation, unspecified: Secondary | ICD-10-CM | POA: Diagnosis not present

## 2019-04-24 DIAGNOSIS — I739 Peripheral vascular disease, unspecified: Secondary | ICD-10-CM | POA: Diagnosis not present

## 2019-04-24 DIAGNOSIS — E785 Hyperlipidemia, unspecified: Secondary | ICD-10-CM | POA: Diagnosis not present

## 2019-04-24 DIAGNOSIS — Z8679 Personal history of other diseases of the circulatory system: Secondary | ICD-10-CM | POA: Diagnosis not present

## 2019-05-03 ENCOUNTER — Other Ambulatory Visit: Payer: Self-pay | Admitting: Cardiology

## 2019-05-03 DIAGNOSIS — I10 Essential (primary) hypertension: Secondary | ICD-10-CM

## 2019-06-13 ENCOUNTER — Other Ambulatory Visit: Payer: Self-pay | Admitting: Internal Medicine

## 2019-06-13 NOTE — Telephone Encounter (Signed)
Done erx 

## 2019-08-07 ENCOUNTER — Telehealth: Payer: Self-pay

## 2019-08-07 NOTE — Telephone Encounter (Signed)
Faxed Eliquis patient assistance form to [employer]. Awaiting approval.  Attempted to contact pt to make aware.

## 2019-08-13 ENCOUNTER — Other Ambulatory Visit: Payer: Self-pay | Admitting: Internal Medicine

## 2019-08-15 NOTE — Telephone Encounter (Signed)
Received approval fax from [facility] patient assistance. Pt is approved from 08/10/19-03/28/20.  Pt made aware.

## 2019-09-06 ENCOUNTER — Other Ambulatory Visit: Payer: Self-pay

## 2019-09-06 ENCOUNTER — Ambulatory Visit (INDEPENDENT_AMBULATORY_CARE_PROVIDER_SITE_OTHER): Payer: Medicare PPO | Admitting: Internal Medicine

## 2019-09-06 ENCOUNTER — Ambulatory Visit (INDEPENDENT_AMBULATORY_CARE_PROVIDER_SITE_OTHER): Payer: Medicare PPO

## 2019-09-06 ENCOUNTER — Encounter: Payer: Self-pay | Admitting: Internal Medicine

## 2019-09-06 VITALS — BP 102/80 | HR 50 | Temp 97.8°F | Resp 16 | Ht 66.0 in | Wt 169.8 lb

## 2019-09-06 VITALS — BP 122/80 | HR 56 | Temp 97.8°F | Ht 66.0 in | Wt 169.0 lb

## 2019-09-06 DIAGNOSIS — E538 Deficiency of other specified B group vitamins: Secondary | ICD-10-CM

## 2019-09-06 DIAGNOSIS — I5032 Chronic diastolic (congestive) heart failure: Secondary | ICD-10-CM

## 2019-09-06 DIAGNOSIS — Z0001 Encounter for general adult medical examination with abnormal findings: Secondary | ICD-10-CM

## 2019-09-06 DIAGNOSIS — Z0000 Encounter for general adult medical examination without abnormal findings: Secondary | ICD-10-CM

## 2019-09-06 DIAGNOSIS — I10 Essential (primary) hypertension: Secondary | ICD-10-CM | POA: Diagnosis not present

## 2019-09-06 DIAGNOSIS — J449 Chronic obstructive pulmonary disease, unspecified: Secondary | ICD-10-CM

## 2019-09-06 DIAGNOSIS — R7303 Prediabetes: Secondary | ICD-10-CM | POA: Diagnosis not present

## 2019-09-06 DIAGNOSIS — E559 Vitamin D deficiency, unspecified: Secondary | ICD-10-CM

## 2019-09-06 DIAGNOSIS — K439 Ventral hernia without obstruction or gangrene: Secondary | ICD-10-CM

## 2019-09-06 DIAGNOSIS — R21 Rash and other nonspecific skin eruption: Secondary | ICD-10-CM

## 2019-09-06 DIAGNOSIS — Z1159 Encounter for screening for other viral diseases: Secondary | ICD-10-CM | POA: Diagnosis not present

## 2019-09-06 LAB — CBC WITH DIFFERENTIAL/PLATELET
Basophils Absolute: 0.1 10*3/uL (ref 0.0–0.1)
Basophils Relative: 1 % (ref 0.0–3.0)
Eosinophils Absolute: 0.2 10*3/uL (ref 0.0–0.7)
Eosinophils Relative: 2.7 % (ref 0.0–5.0)
HCT: 39.6 % (ref 36.0–46.0)
Hemoglobin: 13.3 g/dL (ref 12.0–15.0)
Lymphocytes Relative: 28.1 % (ref 12.0–46.0)
Lymphs Abs: 2.1 10*3/uL (ref 0.7–4.0)
MCHC: 33.5 g/dL (ref 30.0–36.0)
MCV: 94.3 fl (ref 78.0–100.0)
Monocytes Absolute: 0.8 10*3/uL (ref 0.1–1.0)
Monocytes Relative: 10.5 % (ref 3.0–12.0)
Neutro Abs: 4.4 10*3/uL (ref 1.4–7.7)
Neutrophils Relative %: 57.7 % (ref 43.0–77.0)
Platelets: 166 10*3/uL (ref 150.0–400.0)
RBC: 4.2 Mil/uL (ref 3.87–5.11)
RDW: 13.4 % (ref 11.5–15.5)
WBC: 7.7 10*3/uL (ref 4.0–10.5)

## 2019-09-06 LAB — LIPID PANEL
Cholesterol: 132 mg/dL (ref 0–200)
HDL: 40.5 mg/dL (ref >39.00)
LDL Cholesterol: 54 mg/dL (ref 0–99)
NonHDL: 91.78
Total CHOL/HDL Ratio: 3
Triglycerides: 189 mg/dL — ABNORMAL HIGH (ref 0.0–149.0)
VLDL: 37.8 mg/dL (ref 0.0–40.0)

## 2019-09-06 LAB — BASIC METABOLIC PANEL
BUN: 13 mg/dL (ref 6–23)
CO2: 28 mEq/L (ref 19–32)
Calcium: 10.1 mg/dL (ref 8.4–10.5)
Chloride: 102 mEq/L (ref 96–112)
Creatinine, Ser: 0.95 mg/dL (ref 0.40–1.20)
GFR: 57.76 mL/min — ABNORMAL LOW (ref >60.00)
Glucose, Bld: 105 mg/dL — ABNORMAL HIGH (ref 70–99)
Potassium: 4.4 mEq/L (ref 3.5–5.1)
Sodium: 135 mEq/L (ref 135–145)

## 2019-09-06 LAB — URINALYSIS, ROUTINE W REFLEX MICROSCOPIC
Bilirubin Urine: NEGATIVE
Ketones, ur: NEGATIVE
Leukocytes,Ua: NEGATIVE
Nitrite: NEGATIVE
Specific Gravity, Urine: 1.02 (ref 1.000–1.030)
Total Protein, Urine: NEGATIVE
Urine Glucose: NEGATIVE
Urobilinogen, UA: 0.2 (ref 0.0–1.0)
pH: 6 (ref 5.0–8.0)

## 2019-09-06 LAB — HEPATIC FUNCTION PANEL
ALT: 10 U/L (ref 0–35)
AST: 13 U/L (ref 0–37)
Albumin: 4.3 g/dL (ref 3.5–5.2)
Alkaline Phosphatase: 79 U/L (ref 39–117)
Bilirubin, Direct: 0.1 mg/dL (ref 0.0–0.3)
Total Bilirubin: 0.6 mg/dL (ref 0.2–1.2)
Total Protein: 7.3 g/dL (ref 6.0–8.3)

## 2019-09-06 LAB — TSH: TSH: 0.94 u[IU]/mL (ref 0.35–4.50)

## 2019-09-06 LAB — VITAMIN B12: Vitamin B-12: 311 pg/mL (ref 211–911)

## 2019-09-06 LAB — VITAMIN D 25 HYDROXY (VIT D DEFICIENCY, FRACTURES): VITD: 40.24 ng/mL (ref 30.00–100.00)

## 2019-09-06 MED ORDER — TRIAMCINOLONE ACETONIDE 0.1 % EX CREA: 1.0000 "application " | TOPICAL_CREAM | Freq: Two times a day (BID) | CUTANEOUS | 0 refills | Status: DC

## 2019-09-06 NOTE — Patient Instructions (Signed)
Please take all new medication as prescribed - the steroid cream for the rash  Please continue all other medications as before, and refills have been done if requested.  Please have the pharmacy call with any other refills you may need.  Please continue your efforts at being more active, low cholesterol diet, and weight control.  You are otherwise up to date with prevention measures today.  Please keep your appointments with your specialists as you may have planned  Please go to the LAB at the blood drawing area for the tests to be done  You will be contacted by phone if any changes need to be made immediately.  Otherwise, you will receive a letter about your results with an explanation, but please check with MyChart first.  Please remember to sign up for MyChart if you have not done so, as this will be important to you in the future with finding out test results, communicating by private email, and scheduling acute appointments online when needed.  Please make an Appointment to return in 6 months, or sooner if needed

## 2019-09-06 NOTE — Progress Notes (Signed)
Subjective:    Patient ID: Leah Mata, female    DOB: [birth year], 74 y.o.   MRN: [id number]  HPI  Here for wellness and f/u;  Overall doing ok;  Pt denies Chest pain, worsening SOB, DOE, wheezing, orthopnea, PND, worsening LE edema, palpitations, dizziness or syncope.  Pt denies neurological change such as new headache, facial or extremity weakness.  Pt denies polydipsia, polyuria, or low sugar symptoms. Pt states overall good compliance with treatment and medications, good tolerability, and has been trying to follow appropriate diet.  Pt denies worsening depressive symptoms, suicidal ideation or panic. No fever, night sweats, wt loss, loss of appetite, or other constitutional symptoms.  Pt states good ability with ADL's, has low fall risk, home safety reviewed and adequate, no other significant changes in hearing or vision, and only occasionally active with exercise. Also c/o itchy 3 wks onset left mid leg nontender red scaly rash. Also c/o persistent ? Enlarging right abd wall hernia but no pain and Denies worsening reflux, abd pain, dysphagia, n/v, bowel change or blood. Also has mild ongoing sob/doe with wheezing but declines advair due to cost.   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 Her smoking use included cigarettes. She has never used smokeless tobacco. She reports that she does not drink alcohol and does not use drugs. family history includes Congestive Heart Failure in her mother; Coronary artery disease in an other family member; Diabetes in her father and mother; Heart attack in her father; Hypertension in her mother. Allergies  Allergen Reactions   Hydrochlorothiazide Other (See Comments)    unknown   Current Outpatient Medications on File Prior to Visit  Medication Sig Dispense Refill   ALPRAZolam (XANAX) 0.25 MG tablet TAKE 1 TABLET (0.25 MG TOTAL) BY MOUTH 2 (TWO) TIMES DAILY AS NEEDED FOR ANXIETY. 60 tablet 2   amLODipine (NORVASC) 10 MG tablet Take 1 tablet (10 mg total) by mouth daily. 90 tablet 3   apixaban (ELIQUIS) 5 MG TABS tablet Take 1 tablet (5 mg total) by mouth 2 (two) times daily. 60 tablet 6   citalopram (CELEXA) 10 MG tablet Take 1 tablet (10 mg total) by mouth daily. 90 tablet 3   fluticasone (FLONASE) 50 MCG/ACT nasal spray Place 1 spray into both nostrils as needed for allergies or rhinitis.     glycerin adult 2 g suppository      levalbuterol (XOPENEX) 1.25 MG/0.5ML nebulizer solution Take 1.25 mg by nebulization every 6 (six) hours as needed for wheezing or shortness of breath. 270 each 1   lisinopril (ZESTRIL) 20 MG tablet TAKE 1 TABLET TWICE DAILY 180 tablet 3   metoprolol tartrate (LOPRESSOR) 50 MG tablet Take 1 tablet (50 mg total) by mouth  2 (two) times daily. 180 tablet 1   pravastatin (PRAVACHOL) 20 MG tablet Take 1 tablet (20 mg total) by mouth daily. 90 tablet 3   Vitamin D, Ergocalciferol, (DRISDOL) 1.25 MG (50000 UT) CAPS capsule Take 1 capsule (50,000 Units total) by mouth every 7 (seven) days. 12 capsule 0   No current facility-administered medications on file prior to visit.   Review of Systems All otherwise neg per pt    Objective:   Physical Exam BP 122/80 (BP Location: Left Arm, Patient  Position: Sitting, Cuff Size: Large)    Pulse (!) 56    Temp 97.8 F (36.6 C) (Oral)    Ht 5\' 6"  (1.676 m)    Wt 169 lb (76.7 kg)    LMP 03/29/1993 (Approximate)    BMI 27.28 kg/m  VS noted,  Constitutional: Pt appears in NAD HENT: Head: NCAT.  Right Ear: External ear normal.  Left Ear: External ear normal.  Eyes: . Pupils are equal, round, and reactive to light. Conjunctivae and EOM are normal Nose: without d/c or deformity Neck: Neck supple. Gross normal ROM Cardiovascular: Normal rate and regular rhythm.   Pulmonary/Chest: Effort normal and breath sounds without rales or wheezing.  Abd:  Soft, NT, Nd except for right abd wall hernia non tender,, + BS, no organomegaly Neurological: Pt is alert. At baseline orientation, motor grossly intact Skin: Skin is warm. + left mid lateral leg 1 cm area scaly nontender erythem rashes, other new lesions, no LE edema Psychiatric: Pt behavior is normal without agitation  All otherwise neg per pt Lab Results  Component Value Date   WBC 7.7 09/06/2019   HGB 13.3 09/06/2019   HCT 39.6 09/06/2019   PLT 166.0 09/06/2019   GLUCOSE 105 (H) 09/06/2019   CHOL 132 09/06/2019   TRIG 189.0 (H) 09/06/2019   HDL 40.50 09/06/2019   LDLDIRECT 84.1 01/31/2014   LDLCALC 54 09/06/2019   ALT 10 09/06/2019   AST 13 09/06/2019   NA 135 09/06/2019   K 4.4 09/06/2019   CL 102 09/06/2019   CREATININE 0.95 09/06/2019   BUN 13 09/06/2019   CO2 28 09/06/2019   TSH 0.94 09/06/2019   INR 1.16 04/30/2018   HGBA1C 6.0 09/06/2019      Assessment & Plan:

## 2019-09-06 NOTE — Progress Notes (Signed)
Subjective:   COBIE LEIDNER is a 74 y.o. female who presents for Medicare Annual (Subsequent) preventive examination.  Review of Systems:  No ROS. Medicare Wellness Visit Cardiac Risk Factors include: advanced age (>47men, >55 women);dyslipidemia;family history of premature cardiovascular disease;hypertension     Objective:     Vitals: BP 102/80 (BP Location: Left Arm, Patient Position: Sitting, Cuff Size: Normal)   Pulse (!) 50   Temp 97.8 F (36.6 C)   Resp 16   Ht 5\' 6"  (1.676 m)   Wt 169 lb 12.8 oz (77 kg)   LMP 03/29/1993 (Approximate)   SpO2 97%   BMI 27.41 kg/m   Body mass index is 27.41 kg/m.  Advanced Directives 09/06/2019 06/17/2018 04/30/2018 04/28/2018 03/09/2016  Does Patient Have a Medical Advance Directive? No No - No No  Would patient like information on creating a medical advance directive? No - Patient declined No - Patient declined No - Patient declined - No - Patient declined    Tobacco Social History   Tobacco Use  Smoking Status Former Smoker  . Types: Cigarettes  Smokeless Tobacco Never Used  Tobacco Comment   2 packs per week     Counseling given: Not Answered Comment: 2 packs per week   Clinical Intake:  Pre-visit preparation completed: Yes  Pain : No/denies pain Pain Score: 0-No pain     BMI - recorded: 27.41 Nutritional Status: BMI 25 -29 Overweight Nutritional Risks: None Diabetes: No  How often do you need to have someone help you when you read instructions, pamphlets, or other written materials from your doctor or pharmacy?: 1 - Never What is the last grade level you completed in school?: 11th grade  Interpreter Needed?: No  Information entered by :: Jacqlyn Marolf N. Lowell Guitar, LPN  Past Medical History:  Diagnosis Date  . BACK PAIN 03/14/2009  . COPD 11/01/2006  . GLUCOSE INTOLERANCE 03/14/2009  . Headache(784.0) 03/14/2009  . HYPERTENSION 11/01/2006  . 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 INSOMNIA, HX OF 01/06/2007    . Other and unspecified hyperlipidemia 08/22/2013  . Urinary frequency 03/14/2009   Past Surgical History:  Procedure Laterality Date  . ABDOMINAL AORTIC ANEURYSM REPAIR N/A 04/28/2018   Procedure: ANEURYSM ABDOMINAL AORTIC REPAIR WITH HEMASHIELD GOLD VASCULAR GRAFT 22X11 mm;  Surgeon: Rosetta Posner, MD;  Location: [facility];  Service: Vascular;  Laterality: N/A;  . CARDIAC CATHETERIZATION  2003   negative  . DILATION AND CURETTAGE OF UTERUS    . TONSILLECTOMY    . TUBAL LIGATION     Family History  Problem Relation Age of Onset  . Coronary artery disease Other        female 1st degree relative <60 and female 1st degree relative <50  . Diabetes Father   . Heart attack Father   . Diabetes Mother   . Hypertension Mother   . Congestive Heart Failure Mother   . Colon cancer Neg Hx   . Breast cancer Neg Hx    Social History   Socioeconomic History  . Marital status: Widowed    Spouse name: Not on file  . Number of children: Not on file  . Years of education: Not on file  . Highest education level: Not on file  Occupational History  . Not on file  Tobacco Use  . Smoking status: Former Smoker    Types: Cigarettes  . Smokeless tobacco: Never Used  . Tobacco comment: 2 packs per week  Vaping Use  .  Vaping Use: Never used  Substance and Sexual Activity  . Alcohol use: No  . Drug use: No  . Sexual activity: Not Currently    Birth control/protection: Surgical    Comment: BTL  Other Topics Concern  . Not on file  Social History Narrative  . Not on file   Social Determinants of Health   Financial Resource Strain:   . Difficulty of Paying Living Expenses:   Food Insecurity:   . Worried About Programme researcher, broadcasting/film/video in the Last Year:   . Barista in the Last Year:   Transportation Needs:   . Freight forwarder (Medical):   Marland Kitchen Lack of Transportation (Non-Medical):   Physical Activity:   . Days of Exercise per Week:   . Minutes of Exercise per Session:   Stress:   .  Feeling of Stress :   Social Connections:   . Frequency of Communication with Friends and Family:   . Frequency of Social Gatherings with Friends and Family:   . Attends Religious Services:   . Active Member of Clubs or Organizations:   . Attends Banker Meetings:   Marland Kitchen Marital Status:     Outpatient Encounter Medications as of 09/06/2019  Medication Sig  . ALPRAZolam (XANAX) 0.25 MG tablet TAKE 1 TABLET (0.25 MG TOTAL) BY MOUTH 2 (TWO) TIMES DAILY AS NEEDED FOR ANXIETY.  Marland Kitchen amLODipine (NORVASC) 10 MG tablet Take 1 tablet (10 mg total) by mouth daily.  Marland Kitchen apixaban (ELIQUIS) 5 MG TABS tablet Take 1 tablet (5 mg total) by mouth 2 (two) times daily.  . citalopram (CELEXA) 10 MG tablet Take 1 tablet (10 mg total) by mouth daily.  . fluticasone (FLONASE) 50 MCG/ACT nasal spray Place 1 spray into both nostrils as needed for allergies or rhinitis.  Marland Kitchen lisinopril (ZESTRIL) 20 MG tablet TAKE 1 TABLET TWICE DAILY  . metoprolol tartrate (LOPRESSOR) 50 MG tablet Take 1 tablet (50 mg total) by mouth 2 (two) times daily.  . pravastatin (PRAVACHOL) 20 MG tablet Take 1 tablet (20 mg total) by mouth daily.  . Vitamin D, Ergocalciferol, (DRISDOL) 1.25 MG (50000 UT) CAPS capsule Take 1 capsule (50,000 Units total) by mouth every 7 (seven) days.  Marland Kitchen levalbuterol (XOPENEX) 1.25 MG/0.5ML nebulizer solution Take 1.25 mg by nebulization every 6 (six) hours as needed for wheezing or shortness of breath. (Patient not taking: Reported on 09/06/2019)   No facility-administered encounter medications on file as of 09/06/2019.    Activities of Daily Living In your present state of health, do you have any difficulty performing the following activities: 09/06/2019  Hearing? Y  Vision? N  Difficulty concentrating or making decisions? N  Walking or climbing stairs? N  Dressing or bathing? N  Doing errands, shopping? N  Preparing Food and eating ? N  Using the Toilet? N  In the past six months, have you  accidently leaked urine? Y  Do you have problems with loss of bowel control? N  Managing your Medications? N  Managing your Finances? N  Housekeeping or managing your Housekeeping? N  Some recent data might be hidden    Patient Care Team: Corwin Levins, MD as PCP - General Jodelle Red, MD as PCP - Cardiology (Cardiology) Jerene Bears, MD as Consulting Physician (Gynecology)    Assessment:   This is a routine wellness examination for [place].  Exercise Activities and Dietary recommendations Current Exercise Habits: The patient does not participate in regular exercise at present,  Exercise limited by: None identified  Goals    . Weight (lb) < 154 lb (69.9 kg)     Would like to lose 10 pounds by increasing activity.        Fall Risk Fall Risk  09/06/2019 09/06/2018 04/06/2018 03/12/2017 09/07/2016  Falls in the past year? 0 0 0 No No  Number falls in past yr: 0 - 0 - -  Injury with Fall? 0 - 0 - -  Risk for fall due to : No Fall Risks - - - -  Follow up Falls evaluation completed;Education provided - Falls evaluation completed - -   Is the patient's home free of loose throw rugs in walkways, pet beds, electrical cords, etc?   yes      Grab bars in the bathroom? yes      Handrails on the stairs?   yes      Adequate lighting?   yes  Timed Get Up and Go performed: not indicated  Depression Screen PHQ 2/9 Scores 09/06/2019 09/06/2018 04/06/2018 03/12/2017  PHQ - 2 Score 1 0 0 0     Cognitive Function     6CIT Screen 09/06/2019  What Year? 0 points  What month? 0 points  What time? 3 points  Count back from 20 0 points  Months in reverse 4 points  Repeat phrase 2 points  Total Score 9    Immunization History  Administered Date(s) Administered  . Fluad Quad(high Dose 65+) 01/20/2019  . Influenza, High Dose Seasonal PF 03/12/2017, 04/06/2018  . Influenza,inj,Quad PF,6+ Mos 12/01/2012, 01/31/2014, 02/24/2016  . Influenza-Unspecified 11/28/2011, 01/11/2015  .  Pneumococcal Conjugate-13 08/15/2014  . Pneumococcal Polysaccharide-23 05/30/2012  . Td 11/28/2006  . Tdap 09/07/2016  . Zoster 11/29/2008    Qualifies for Shingles Vaccine? Yes  Screening Tests Health Maintenance  Topic Date Due  . Hepatitis C Screening  Never done  . COVID-19 Vaccine (1) Never done  . INFLUENZA VACCINE  10/28/2019  . MAMMOGRAM  01/18/2021  . COLONOSCOPY  07/08/2022  . TETANUS/TDAP  09/08/2026  . DEXA SCAN  Completed  . PNA vac Low Risk Adult  Completed    Cancer Screenings: Lung: Low Dose CT Chest recommended if Age 59-80 years, 30 pack-year currently smoking OR have quit w/in 15years. Patient does qualify. Breast:  Up to date on Mammogram? Yes   Up to date of Bone Density/Dexa? Yes Colorectal: Yes, due 2024  Additional Screenings: Hepatitis C Screening: never done     Plan:     Reviewed health maintenance screenings with patient today and relevant education, vaccines, and/or referrals were provided.    Continue doing brain stimulating activities (puzzles, reading, adult coloring books, staying active) to keep memory sharp.    Continue to eat heart healthy diet (full of fruits, vegetables, whole grains, lean protein, water--limit salt, fat, and sugar intake) and increase physical activity as tolerated.  I have personally reviewed and noted the following in the patient's chart:   . Medical and social history . Use of alcohol, tobacco or illicit drugs  . Current medications and supplements . Functional ability and status . Nutritional status . Physical activity . Advanced directives . List of other physicians . Hospitalizations, surgeries, and ER visits in previous 12 months . Vitals . Screenings to include cognitive, depression, and falls . Referrals and appointments  In addition, I have reviewed and discussed with patient certain preventive protocols, quality metrics, and best practice recommendations. A written personalized care plan for  preventive services  as well as general preventive health recommendations were provided to patient.     [name], LPN  1/76/[id number]  Nurse Health Advisor

## 2019-09-06 NOTE — Patient Instructions (Signed)
Leah Mata , Thank you for taking time to come for your Medicare Wellness Visit. I appreciate your ongoing commitment to your health goals. Please review the following plan we discussed and let me know if I can assist you in the future.   Screening recommendations/referrals: Colonoscopy: last done 07/07/2012; due 2024 Mammogram: last done 01/19/2019; due 2022 Bone Density: last done 09/07/2016; done every 2-3 years if needed Recommended yearly ophthalmology/optometry visit for glaucoma screening and checkup Recommended yearly dental visit for hygiene and checkup  Vaccinations: Influenza vaccine: 01/20/2019 Pneumococcal vaccine: completed Tdap vaccine: 09/07/2016; due every 10 years Shingles vaccine: never done    Zoster: 09/07/2016 Covid-19: never done  Advanced directives: No  Conditions/risks identified: Yes  Next appointment: Please schedule your 1 year Medicare Wellness Visit with your Health Coach.   Preventive Care 65 Years and Older, Female Preventive care refers to lifestyle choices and visits with your health care provider that can promote health and wellness. What does preventive care include?  A yearly physical exam. This is also called an annual well check.  Dental exams once or twice a year.  Routine eye exams. Ask your health care provider how often you should have your eyes checked.  Personal lifestyle choices, including:  Daily care of your teeth and gums.  Regular physical activity.  Eating a healthy diet.  Avoiding tobacco and drug use.  Limiting alcohol use.  Practicing safe sex.  Taking low-dose aspirin every day.  Taking vitamin and mineral supplements as recommended by your health care provider. What happens during an annual well check? The services and screenings done by your health care provider during your annual well check will depend on your age, overall health, lifestyle risk factors, and family history of disease. Counseling  Your health  care provider may ask you questions about your:  Alcohol use.  Tobacco use.  Drug use.  Emotional well-being.  Home and relationship well-being.  Sexual activity.  Eating habits.  History of falls.  Memory and ability to understand (cognition).  Work and work Astronomer.  Reproductive health. Screening  You may have the following tests or measurements:  Height, weight, and BMI.  Blood pressure.  Lipid and cholesterol levels. These may be checked every 5 years, or more frequently if you are over 53 years old.  Skin check.  Lung cancer screening. You may have this screening every year starting at age 67 if you have a 30-pack-year history of smoking and currently smoke or have quit within the past 15 years.  Fecal occult blood test (FOBT) of the stool. You may have this test every year starting at age 66.  Flexible sigmoidoscopy or colonoscopy. You may have a sigmoidoscopy every 5 years or a colonoscopy every 10 years starting at age 74.  Hepatitis C blood test.  Hepatitis B blood test.  Sexually transmitted disease (STD) testing.  Diabetes screening. This is done by checking your blood sugar (glucose) after you have not eaten for a while (fasting). You may have this done every 1-3 years.  Bone density scan. This is done to screen for osteoporosis. You may have this done starting at age 72.  Mammogram. This may be done every 1-2 years. Talk to your health care provider about how often you should have regular mammograms. Talk with your health care provider about your test results, treatment options, and if necessary, the need for more tests. Vaccines  Your health care provider may recommend certain vaccines, such as:  Influenza vaccine. This is  recommended every year.  Tetanus, diphtheria, and acellular pertussis (Tdap, Td) vaccine. You may need a Td booster every 10 years.  Zoster vaccine. You may need this after age 68.  Pneumococcal 13-valent conjugate  (PCV13) vaccine. One dose is recommended after age 63.  Pneumococcal polysaccharide (PPSV23) vaccine. One dose is recommended after age 9. Talk to your health care provider about which screenings and vaccines you need and how often you need them. This information is not intended to replace advice given to you by your health care provider. Make sure you discuss any questions you have with your health care provider. Document Released: 04/11/2015 Document Revised: 12/03/2015 Document Reviewed: 01/14/2015 Elsevier Interactive Patient Education  2017 [employer] Prevention in the Home Falls can cause injuries. They can happen to people of all ages. There are many things you can do to make your home safe and to help prevent falls. What can I do on the outside of my home?  Regularly fix the edges of walkways and driveways and fix any cracks.  Remove anything that might make you trip as you walk through a door, such as a raised step or threshold.  Trim any bushes or trees on the path to your home.  Use bright outdoor lighting.  Clear any walking paths of anything that might make someone trip, such as rocks or tools.  Regularly check to see if handrails are loose or broken. Make sure that both sides of any steps have handrails.  Any raised decks and porches should have guardrails on the edges.  Have any leaves, snow, or ice cleared regularly.  Use sand or salt on walking paths during winter.  Clean up any spills in your garage right away. This includes oil or grease spills. What can I do in the bathroom?  Use night lights.  Install grab bars by the toilet and in the tub and shower. Do not use towel bars as grab bars.  Use non-skid mats or decals in the tub or shower.  If you need to sit down in the shower, use a plastic, non-slip stool.  Keep the floor dry. Clean up any water that spills on the floor as soon as it happens.  Remove soap buildup in the tub or shower  regularly.  Attach bath mats securely with double-sided non-slip rug tape.  Do not have throw rugs and other things on the floor that can make you trip. What can I do in the bedroom?  Use night lights.  Make sure that you have a light by your bed that is easy to reach.  Do not use any sheets or blankets that are too big for your bed. They should not hang down onto the floor.  Have a firm chair that has side arms. You can use this for support while you get dressed.  Do not have throw rugs and other things on the floor that can make you trip. What can I do in the kitchen?  Clean up any spills right away.  Avoid walking on wet floors.  Keep items that you use a lot in easy-to-reach places.  If you need to reach something above you, use a strong step stool that has a grab bar.  Keep electrical cords out of the way.  Do not use floor polish or wax that makes floors slippery. If you must use wax, use non-skid floor wax.  Do not have throw rugs and other things on the floor that can make you trip.  What can I do with my stairs?  Do not leave any items on the stairs.  Make sure that there are handrails on both sides of the stairs and use them. Fix handrails that are broken or loose. Make sure that handrails are as long as the stairways.  Check any carpeting to make sure that it is firmly attached to the stairs. Fix any carpet that is loose or worn.  Avoid having throw rugs at the top or bottom of the stairs. If you do have throw rugs, attach them to the floor with carpet tape.  Make sure that you have a light switch at the top of the stairs and the bottom of the stairs. If you do not have them, ask someone to add them for you. What else can I do to help prevent falls?  Wear shoes that:  Do not have high heels.  Have rubber bottoms.  Are comfortable and fit you well.  Are closed at the toe. Do not wear sandals.  If you use a stepladder:  Make sure that it is fully  opened. Do not climb a closed stepladder.  Make sure that both sides of the stepladder are locked into place.  Ask someone to hold it for you, if possible.  Clearly mark and make sure that you can see:  Any grab bars or handrails.  First and last steps.  Where the edge of each step is.  Use tools that help you move around (mobility aids) if they are needed. These include:  Canes.  Walkers.  Scooters.  Crutches.  Turn on the lights when you go into a dark area. Replace any light bulbs as soon as they burn out.  Set up your furniture so you have a clear path. Avoid moving your furniture around.  If any of your floors are uneven, fix them.  If there are any pets around you, be aware of where they are.  Review your medicines with your doctor. Some medicines can make you feel dizzy. This can increase your chance of falling. Ask your doctor what other things that you can do to help prevent falls. This information is not intended to replace advice given to you by your health care provider. Make sure you discuss any questions you have with your health care provider. Document Released: 01/09/2009 Document Revised: 08/21/2015 Document Reviewed: 04/19/2014 Elsevier Interactive Patient Education  2017 [employer].

## 2019-09-07 LAB — HEMOGLOBIN A1C: Hgb A1c MFr Bld: 6 % (ref 4.6–6.5)

## 2019-09-07 LAB — HEPATITIS C ANTIBODY
Hepatitis C Ab: NONREACTIVE
SIGNAL TO CUT-OFF: 0.02

## 2019-09-09 NOTE — Assessment & Plan Note (Signed)
Declines increased tx such as advair due to cost

## 2019-09-09 NOTE — Assessment & Plan Note (Signed)

## 2019-09-09 NOTE — Assessment & Plan Note (Signed)
stable overall by history and exam, recent data reviewed with pt, and pt to continue medical treatment as before,  to f/u any worsening symptoms or concerns  

## 2019-09-09 NOTE — Assessment & Plan Note (Signed)
Cont oral replacement 

## 2019-09-09 NOTE — Assessment & Plan Note (Addendum)
C/w eczema - for triam cr prn  I spent 31 minutes in addition to time for CPX wellness examination in preparing to see the patient by review of recent labs, imaging and procedures, obtaining and reviewing separately obtained history, communicating with the patient and family or caregiver, ordering medications, tests or procedures, and documenting clinical information in the EHR including the differential Dx, treatment, and any further evaluation and other management of rash, preDM, htn, abd wall hernia, copd, vit d def, hernia, chf

## 2019-09-09 NOTE — Assessment & Plan Note (Signed)
asympt  to f/u any worsening symptoms or concerns

## 2019-09-23 ENCOUNTER — Other Ambulatory Visit: Payer: Self-pay | Admitting: Internal Medicine

## 2019-09-23 NOTE — Telephone Encounter (Signed)
Done erx 

## 2019-10-04 NOTE — Telephone Encounter (Signed)
There is a fax request to refill the alprazolam to [facility]. Please advise if it can be sent there instead. If approved, we can have the local pharmacy delete the current rx.

## 2019-10-08 ENCOUNTER — Other Ambulatory Visit: Payer: Self-pay

## 2019-10-08 ENCOUNTER — Telehealth: Payer: Self-pay | Admitting: Internal Medicine

## 2019-10-08 MED ORDER — METOPROLOL TARTRATE 50 MG PO TABS: 50.0000 mg | ORAL_TABLET | Freq: Two times a day (BID) | ORAL | 1 refills | Status: DC

## 2019-10-08 NOTE — Telephone Encounter (Signed)
  Patient has no medication remaining  1.Medication Requested:metoprolol tartrate (LOPRESSOR) 50 MG tablet  2. Pharmacy (Name, Street, City):[id number] - [place], [place] - [address]  3. On Med List: yes  4. Last Visit with PCP: 09/06/19   5. Next visit date with PCP: n/a   Agent: Please be advised that RX refills may take up to 3 business days. We ask that you follow-up with your pharmacy.

## 2019-10-08 NOTE — Telephone Encounter (Signed)
Sent to pts requested pharmacy.

## 2019-10-15 ENCOUNTER — Other Ambulatory Visit: Payer: Self-pay

## 2019-10-15 MED ORDER — METOPROLOL TARTRATE 50 MG PO TABS: 50.0000 mg | ORAL_TABLET | Freq: Two times a day (BID) | ORAL | 1 refills | Status: DC

## 2019-10-25 ENCOUNTER — Telehealth: Payer: Self-pay | Admitting: *Deleted

## 2019-10-25 NOTE — Telephone Encounter (Signed)
A detailed message was left, re: follow up visit. 

## 2019-12-11 ENCOUNTER — Encounter: Payer: Self-pay | Admitting: Cardiology

## 2019-12-11 ENCOUNTER — Ambulatory Visit: Payer: Medicare PPO | Admitting: Cardiology

## 2019-12-11 ENCOUNTER — Other Ambulatory Visit: Payer: Self-pay

## 2019-12-11 VITALS — BP 140/74 | HR 55 | Temp 96.8°F | Ht 66.0 in | Wt 153.2 lb

## 2019-12-11 DIAGNOSIS — Z7189 Other specified counseling: Secondary | ICD-10-CM

## 2019-12-11 DIAGNOSIS — I480 Paroxysmal atrial fibrillation: Secondary | ICD-10-CM | POA: Diagnosis not present

## 2019-12-11 DIAGNOSIS — I5032 Chronic diastolic (congestive) heart failure: Secondary | ICD-10-CM | POA: Diagnosis not present

## 2019-12-11 DIAGNOSIS — I10 Essential (primary) hypertension: Secondary | ICD-10-CM | POA: Diagnosis not present

## 2019-12-11 DIAGNOSIS — F17210 Nicotine dependence, cigarettes, uncomplicated: Secondary | ICD-10-CM | POA: Diagnosis not present

## 2019-12-11 DIAGNOSIS — Z8616 Personal history of COVID-19: Secondary | ICD-10-CM

## 2019-12-11 DIAGNOSIS — Z9889 Other specified postprocedural states: Secondary | ICD-10-CM

## 2019-12-11 DIAGNOSIS — Z716 Tobacco abuse counseling: Secondary | ICD-10-CM | POA: Diagnosis not present

## 2019-12-11 DIAGNOSIS — I4892 Unspecified atrial flutter: Secondary | ICD-10-CM | POA: Diagnosis not present

## 2019-12-11 DIAGNOSIS — E7800 Pure hypercholesterolemia, unspecified: Secondary | ICD-10-CM

## 2019-12-11 NOTE — Progress Notes (Signed)
Cardiology Office Note:    Date:  12/11/2019   ID:  Leah Mata, DOB [birth year], MRN [id number]  PCP:  Corwin Levins, MD  Cardiologist:  Jodelle Red, MD  Referring MD: Corwin Levins, MD   CC: follow up.   History of Present Illness:    Leah Mata is a 74 y.o. female with a hx of paroxysmal atrial fib/flutter, hypertension, hyperlipidemia, AAAwithruptured abdominal aortic aneurysm with surgery on 04/29/2018.She had a >30 day admission, with discharge on 05/30/18.  Today: Her main concern is her abdominal hernia. We have discussed before, no plans for surgery at this time. Has lost 20 lbs using [employer].   Blood pressure mildly elevated today, but was 102/80 and 122/80 in June. Has a machine at home, but gets variable numbers. No very high numbers.   Back to smoking slightly, discussed the importance of complete cessation.  No longer follows with Dr. Arbie Cookey. Per recommendations, should have CT scan 5 years post op (2025).   No melena, hematochezia, or hematuria. No significant bleeding.  Denies chest pain, shortness of breath at rest or with normal exertion. No PND, orthopnea, LE edema or unexpected weight gain. No syncope or palpitations.  Past Medical History:  Diagnosis Date  . BACK PAIN 03/14/2009  . COPD 11/01/2006  . GLUCOSE INTOLERANCE 03/14/2009  . Headache(784.0) 03/14/2009  . HYPERTENSION 11/01/2006  . 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 INSOMNIA, HX OF 01/06/2007  . Other and unspecified hyperlipidemia 08/22/2013  . Urinary frequency 03/14/2009    Past Surgical History:  Procedure Laterality Date  . ABDOMINAL AORTIC ANEURYSM REPAIR N/A 04/28/2018   Procedure: ANEURYSM ABDOMINAL AORTIC REPAIR WITH HEMASHIELD GOLD VASCULAR GRAFT 22X11 mm;  Surgeon: Larina Earthly, MD;  Location: [facility] OR;  Service: Vascular;  Laterality: N/A;  . CARDIAC CATHETERIZATION  2003   negative  . DILATION AND CURETTAGE OF UTERUS    . TONSILLECTOMY    . TUBAL LIGATION       Current Medications: Current Outpatient Medications on File Prior to Visit  Medication Sig  . ALPRAZolam (XANAX) 0.25 MG tablet TAKE 1 TABLET BY MOUTH TWICE A DAY AS NEEDED FOR ANXIETY  . amLODipine (NORVASC) 10 MG tablet Take 1 tablet (10 mg total) by mouth daily.  Marland Kitchen apixaban (ELIQUIS) 5 MG TABS tablet Take 1 tablet (5 mg total) by mouth 2 (two) times daily.  . citalopram (CELEXA) 10 MG tablet Take 1 tablet (10 mg total) by mouth daily.  . fluticasone (FLONASE) 50 MCG/ACT nasal spray Place 1 spray into both nostrils as needed for allergies or rhinitis.  Marland Kitchen glycerin adult 2 g suppository   . levalbuterol (XOPENEX) 1.25 MG/0.5ML nebulizer solution Take 1.25 mg by nebulization every 6 (six) hours as needed for wheezing or shortness of breath.  . lisinopril (ZESTRIL) 20 MG tablet TAKE 1 TABLET TWICE DAILY  . metoprolol tartrate (LOPRESSOR) 50 MG tablet Take 1 tablet (50 mg total) by mouth 2 (two) times daily.  . pravastatin (PRAVACHOL) 20 MG tablet Take 1 tablet (20 mg total) by mouth daily.  Marland Kitchen triamcinolone cream (KENALOG) 0.1 % Apply 1 application topically 2 (two) times daily.  . Vitamin D, Ergocalciferol, (DRISDOL) 1.25 MG (50000 UT) CAPS capsule Take 1 capsule (50,000 Units total) by mouth every 7 (seven) days.   No current facility-administered medications on file prior to visit.     Allergies:   Hydrochlorothiazide   Social History   Tobacco Use  . Smoking status: Former Smoker  Types: Cigarettes  . Smokeless tobacco: Never Used  . Tobacco comment: 2 packs per week  Vaping Use  . Vaping Use: Never used  Substance Use Topics  . Alcohol use: No  . Drug use: No    Family History: family history includes Congestive Heart Failure in her mother; Coronary artery disease in an other family member; Diabetes in her father and mother; Heart attack in her father; Hypertension in her mother. There is no history of Colon cancer or Breast cancer.  ROS:   Please see the history of  present illness.  Additional pertinent ROS otherwise unremarkable.    EKGs/Labs/Other Studies Reviewed:    The following studies were reviewed today: Echo 04/2018 1. The left ventricle has hyperdynamic systolic function of >65%. The  cavity size is normal. There is no increased left ventricular wall  thickness. Echo evidence of impaired diastolic relaxation.  2. The right ventricle has normal systolic function. The cavity in normal  in size. There is no increase in right ventricular wall thickness.  3. The mitral valve is normal in structure.  4. The aortic valve has an indeterminant number of cusps.  5. The pulmonic valve is Not well seen. Pulmonic valve regurgitation was  not assessed by color flow Doppler.  6. There is mild dilatation of the aortic root.  7. The interatrial septum was not assessed.  8. Extremely limited due to poor sound wave transmission; definity used;  vigorous LV systolic function; mild diastolic dysfunction.   EKG:  EKG is personally reviewed.  The ekg ordered today demonstrates sinus bradycardia with 1st degree AV block at 55 bpm.  Recent Labs: 09/06/2019: ALT 10; BUN 13; Creatinine, Ser 0.95; Hemoglobin 13.3; Platelets 166.0; Potassium 4.4; Sodium 135; TSH 0.94  Recent Lipid Panel    Component Value Date/Time   CHOL 132 09/06/2019 1138   TRIG 189.0 (H) 09/06/2019 1138   HDL 40.50 09/06/2019 1138   CHOLHDL 3 09/06/2019 1138   VLDL 37.8 09/06/2019 1138   LDLCALC 54 09/06/2019 1138   LDLDIRECT 84.1 01/31/2014 1143    Physical Exam:    VS:  BP 140/74   Pulse (!) 55   Temp (!) 96.8 F (36 C)   Ht 5\' 6"  (1.676 m)   Wt 153 lb 3.2 oz (69.5 kg)   LMP 03/29/1993 (Approximate)   SpO2 99%   BMI 24.73 kg/m     Wt Readings from Last 3 Encounters:  12/11/19 153 lb 3.2 oz (69.5 kg)  09/06/19 169 lb (76.7 kg)  09/06/19 169 lb 12.8 oz (77 kg)    GEN: Well nourished, well developed in no acute distress HEENT: Normal, moist mucous membranes NECK:  No JVD CARDIAC: regular rhythm, normal S1 and S2, no rubs or gallops. No murmur. VASCULAR: Radial and DP pulses 2+ bilaterally. No carotid bruits RESPIRATORY:  Clear to auscultation without rales, wheezing or rhonchi  ABDOMEN: Soft, non-tender, non-distended. Midline abdominal scar with large ventral hernia MUSCULOSKELETAL:  Ambulates independently SKIN: Warm and dry, no edema NEUROLOGIC:  Alert and oriented x 3. No focal neuro deficits noted. PSYCHIATRIC:  Normal affect   ASSESSMENT:    1. Paroxysmal atrial fibrillation (HCC)   2. Atrial flutter, paroxysmal (HCC)   3. Essential hypertension   4. Pure hypercholesterolemia   5. Chronic diastolic heart failure (HCC)   6. History of AAA (abdominal aortic aneurysm) repair   7. History of 2019 novel coronavirus disease (COVID-19)   8. Cardiac risk counseling   9. Counseling on health  promotion and disease prevention   10. Tobacco abuse counseling    PLAN:    Hypertension: elevated today but recent numbers at goal. -continue amlodipine 10 mg daily -continue lisinopril 20 mg BID (patient preference for BID, ok by me, want 40 mg total daily dose) -currently on metoprolol 50 mg BID. Next change will be to change this to carvedilol if more BP control needed -discussed red flag symptoms that need immediate medical attention  Paroxysmal atrial fib and flutter: CHA2DS2/VAS Stroke Risk Points=4 -in NSRon most recent ECG, denies palpitations or tachycardia -continue apixaban 5 mg BID -on metoprolol for rate control -ECG sinus today  Tobacco abuse counseling: The patient was counseled on tobacco cessation today for 3 minutes.  Counseling included reviewing the risks of smoking tobacco products, how it impacts the patient's current medical diagnoses and different strategies for quitting.  Pharmacotherapy to aid in tobacco cessation was not prescribed today.  History of AAA rupture, with open repair of 8.3 cm infrarenal AAA:  -BP, lipid  control as noted -counseled on importance of complete tobacco avoidance  Chronic diastolic dysfunction: -euvolemic  Hypercholesterolemia: with PAD, LDL goal <70.  -last LDL 54 09/06/2019 -continue pravastatin 20 mg, though ideally given her ASCVD would like a higher intensity statin. However, she tolerates this dose, so this is a reasonable compromise.  Cardiac risk counseling and prevention recommendations: -recommend heart healthy/Mediterranean diet, with whole grains, fruits, vegetable, fish, lean meats, nuts, and olive oil. Limit salt. -recommend moderate walking, 3-5 times/week for 30-50 minutes each session. Aim for at least 150 minutes.week. Goal should be pace of 3 miles/hours, or walking 1.5 miles in 30 minutes -recommend avoidance of tobacco products. Avoid excess alcohol.  Prior Covid-19 infection: feels near baseline  Plan for follow up: 1 year  Jodelle Red, MD, PhD [place]  [facility]   Medication Adjustments/Labs and Tests Ordered: Current medicines are reviewed at length with the patient today.  Concerns regarding medicines are outlined above.  Orders Placed This Encounter  Procedures  . EKG 12-Lead   No orders of the defined types were placed in this encounter.   Patient Instructions  Medication Instructions:  Your Physician recommend you continue on your current medication as directed.    *If you need a refill on your cardiac medications before your next appointment, please call your pharmacy*   Lab Work: None ordered   Testing/Procedures: None ordered    Follow-Up: At [facility], you and your health needs are our priority.  As part of our continuing mission to provide you with exceptional heart care, we have created designated Provider Care Teams.  These Care Teams include your primary Cardiologist (physician) and Advanced Practice Providers (APPs -  Physician Assistants and Nurse Practitioners) who all work together to provide  you with the care you need, when you need it.  We recommend signing up for the patient portal called "MyChart".  Sign up information is provided on this After Visit Summary.  MyChart is used to connect with patients for Virtual Visits (Telemedicine).  Patients are able to view lab/test results, encounter notes, upcoming appointments, etc.  Non-urgent messages can be sent to your provider as well.   To learn more about what you can do with MyChart, go to [email or web address].    Your next appointment:   1 year(s)  The format for your next appointment:   In Person  Provider:   Jodelle Red, MD       Signed, Jodelle Red, MD PhD 12/11/2019  [facility]

## 2019-12-11 NOTE — Patient Instructions (Signed)

## 2019-12-24 ENCOUNTER — Other Ambulatory Visit: Payer: Self-pay | Admitting: Obstetrics & Gynecology

## 2019-12-24 DIAGNOSIS — Z1231 Encounter for screening mammogram for malignant neoplasm of breast: Secondary | ICD-10-CM

## 2020-01-15 ENCOUNTER — Telehealth: Payer: Self-pay | Admitting: Internal Medicine

## 2020-01-15 NOTE — Telephone Encounter (Signed)
triamcinolone cream (KENALOG) 0.1 % Requesting a refill  [facility] - [place], [place] - [address] Phone:  [phone]  Fax:  [phone]     Last seen 09/06/19

## 2020-01-16 ENCOUNTER — Other Ambulatory Visit: Payer: Self-pay

## 2020-01-16 MED ORDER — TRIAMCINOLONE ACETONIDE 0.1 % EX CREA: 1.0000 "application " | TOPICAL_CREAM | Freq: Two times a day (BID) | CUTANEOUS | 0 refills | Status: DC

## 2020-01-21 ENCOUNTER — Other Ambulatory Visit: Payer: Self-pay

## 2020-01-21 ENCOUNTER — Ambulatory Visit
Admission: RE | Admit: 2020-01-21 | Discharge: 2020-01-21 | Disposition: A | Discharge status: Home / Self Care | Payer: Medicare PPO | Source: Ambulatory Visit | Attending: Obstetrics & Gynecology | Admitting: Obstetrics & Gynecology

## 2020-01-21 DIAGNOSIS — Z1231 Encounter for screening mammogram for malignant neoplasm of breast: Secondary | ICD-10-CM

## 2020-01-31 ENCOUNTER — Other Ambulatory Visit: Payer: Self-pay | Admitting: Internal Medicine

## 2020-02-06 ENCOUNTER — Other Ambulatory Visit: Payer: Self-pay | Admitting: Internal Medicine

## 2020-02-29 ENCOUNTER — Telehealth: Payer: Self-pay

## 2020-02-29 NOTE — Telephone Encounter (Signed)
Patient assistance form for Eliquis 5 mg BID faxed to [employer] Patient [facility]. Awaiting approval.

## 2020-03-01 ENCOUNTER — Other Ambulatory Visit: Payer: Self-pay | Admitting: Internal Medicine

## 2020-03-12 ENCOUNTER — Other Ambulatory Visit: Payer: Self-pay | Admitting: Internal Medicine

## 2020-03-12 ENCOUNTER — Other Ambulatory Visit: Payer: Self-pay | Admitting: Cardiology

## 2020-03-12 DIAGNOSIS — I10 Essential (primary) hypertension: Secondary | ICD-10-CM

## 2020-03-13 NOTE — Telephone Encounter (Signed)
Called [place] to check on status of patient assistance. Was informed they received patient's information and income, but can't locate provider's portion of the application.  Informed rep that nurse will refax forms today for review.

## 2020-03-13 NOTE — Telephone Encounter (Signed)
Please refill as per office routine med refill policy (all routine meds refilled for 3 mo or monthly per pt preference up to one year from last visit, then month to month grace period for 3 mo, then further med refills will have to be denied)  

## 2020-03-17 NOTE — Telephone Encounter (Signed)
Pt informed that nurse re-faxed provider portion on 12/16 and will call bristol myers to check status tomorrow morning. Pt verbalized understanding.

## 2020-03-17 NOTE — Telephone Encounter (Signed)
    Pt calling to follow up her pt assistance, she said she called [facility] and according to them they haven't receive pt assistance request

## 2020-03-18 NOTE — Telephone Encounter (Signed)
Spoke to [employer] rep who confirmed they have received providers portion and currently processing applications. Pt made aware.

## 2020-03-20 ENCOUNTER — Telehealth: Payer: Self-pay | Admitting: Internal Medicine

## 2020-03-20 NOTE — Telephone Encounter (Signed)
    Patient requesting refill for ALPRAZolam Prudy Feeler) 0.25 MG tablet, to [facility]

## 2020-03-24 ENCOUNTER — Other Ambulatory Visit: Payer: Self-pay | Admitting: Internal Medicine

## 2020-03-24 MED ORDER — ALPRAZOLAM 0.25 MG PO TABS
ORAL_TABLET | ORAL | 2 refills | Status: DC
Start: 2020-03-24 — End: 2020-10-28

## 2020-03-24 NOTE — Telephone Encounter (Signed)
Sent to Dr. John. 

## 2020-03-24 NOTE — Telephone Encounter (Signed)
Done erx 

## 2020-04-04 ENCOUNTER — Other Ambulatory Visit: Payer: Self-pay | Admitting: Internal Medicine

## 2020-04-04 NOTE — Telephone Encounter (Signed)
Please refill as per office routine med refill policy (all routine meds refilled for 3 mo or monthly per pt preference up to one year from last visit, then month to month grace period for 3 mo, then further med refills will have to be denied)  

## 2020-04-16 ENCOUNTER — Other Ambulatory Visit: Payer: Self-pay | Admitting: Internal Medicine

## 2020-04-17 NOTE — Telephone Encounter (Signed)
Contacted Leah Mata to check on status. Was informed application is still processing.

## 2020-04-21 ENCOUNTER — Telehealth (INDEPENDENT_AMBULATORY_CARE_PROVIDER_SITE_OTHER): Payer: Medicare HMO | Admitting: Internal Medicine

## 2020-04-21 DIAGNOSIS — R7303 Prediabetes: Secondary | ICD-10-CM

## 2020-04-21 DIAGNOSIS — J441 Chronic obstructive pulmonary disease with (acute) exacerbation: Secondary | ICD-10-CM | POA: Diagnosis not present

## 2020-04-21 DIAGNOSIS — R059 Cough, unspecified: Secondary | ICD-10-CM | POA: Diagnosis not present

## 2020-04-21 MED ORDER — PROMETHAZINE-CODEINE 6.25-10 MG/5ML PO SYRP: 5.0000 mL | ORAL_SOLUTION | Freq: Four times a day (QID) | ORAL | 0 refills | Status: DC | PRN

## 2020-04-21 MED ORDER — PREDNISONE 10 MG PO TABS: ORAL_TABLET | ORAL | 0 refills | Status: DC

## 2020-04-21 MED ORDER — LEVOFLOXACIN 500 MG PO TABS: 500.0000 mg | ORAL_TABLET | Freq: Every day | ORAL | 0 refills | Status: AC

## 2020-04-21 NOTE — Progress Notes (Signed)
Patient ID: Leah Mata, female   DOB: 1945-08-03, 75 y.o.   MRN: [id number]  Virtual Visit via Video Note  I connected with Leah Mata on 04/21/20 at  2:20 PM EST by a video enabled telemedicine application and verified that I am speaking with the correct person using two identifiers.  Location of all participants today Patient: at home Provider: at office   I discussed the limitations of evaluation and management by telemedicine and the availability of in person appointments. The patient expressed understanding and agreed to proceed.  History of Present Illness: Here to f/u with cough and wheezing;  Pt has hx of covid illness nov 2020, and more recently now son covid +, but her home test is negative today; c/o 3 days worsening chills, fever, slight cough, worsening sob and mild wheeziness, nasal congestion, no lack of taste and smell - still has those,, but appetite lower; denies nausea and diarrhea.  Pt denies chest pain, orthopnea, PND, increased LE swelling, palpitations, dizziness or syncope.  Pt denies new neurological symptoms such as new headache, or facial or extremity weakness or numbness   Pt denies polydipsia, polyuria, Past Medical History:  Diagnosis Date  . BACK PAIN 03/14/2009  . COPD 11/01/2006  . GLUCOSE INTOLERANCE 03/14/2009  . Headache(784.0) 03/14/2009  . HYPERTENSION 11/01/2006  . 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 INSOMNIA, HX OF 01/06/2007  . Other and unspecified hyperlipidemia 08/22/2013  . Urinary frequency 03/14/2009   Past Surgical History:  Procedure Laterality Date  . ABDOMINAL AORTIC ANEURYSM REPAIR N/A 04/28/2018   Procedure: ANEURYSM ABDOMINAL AORTIC REPAIR WITH HEMASHIELD GOLD VASCULAR GRAFT 22X11 mm;  Surgeon: Larina Earthly, MD;  Location: [facility] OR;  Service: Vascular;  Laterality: N/A;  . CARDIAC CATHETERIZATION  2003   negative  . DILATION AND CURETTAGE OF UTERUS    . TONSILLECTOMY    . TUBAL LIGATION      reports that she has quit  smoking. Her smoking use included cigarettes. She has never used smokeless tobacco. She reports that she does not drink alcohol and does not use drugs. family history includes Congestive Heart Failure in her mother; Coronary artery disease in an other family member; Diabetes in her father and mother; Heart attack in her father; Hypertension in her mother. Allergies  Allergen Reactions  . Hydrochlorothiazide Other (See Comments)    unknown   Current Outpatient Medications on File Prior to Visit  Medication Sig Dispense Refill  . ALPRAZolam (XANAX) 0.25 MG tablet TAKE 1 TABLET BY MOUTH TWICE A DAY AS NEEDED FOR ANXIETY 60 tablet 2  . amLODipine (NORVASC) 10 MG tablet Take 1 tablet (10 mg total) by mouth daily. 90 tablet 3  . apixaban (ELIQUIS) 5 MG TABS tablet Take 1 tablet (5 mg total) by mouth 2 (two) times daily. 60 tablet 6  . citalopram (CELEXA) 10 MG tablet TAKE 1 TABLET EVERY DAY 90 tablet 2  . fluticasone (FLONASE) 50 MCG/ACT nasal spray Place 1 spray into both nostrils as needed for allergies or rhinitis.    Marland Kitchen glycerin adult 2 g suppository     . levalbuterol (XOPENEX) 1.25 MG/0.5ML nebulizer solution Take 1.25 mg by nebulization every 6 (six) hours as needed for wheezing or shortness of breath. 270 each 1  . lisinopril (ZESTRIL) 20 MG tablet TAKE 1 TABLET TWICE DAILY 180 tablet 3  . metoprolol tartrate (LOPRESSOR) 50 MG tablet Take 1 tablet (50 mg total) by mouth 2 (two) times daily. 180 tablet 1  .  pravastatin (PRAVACHOL) 20 MG tablet Take 1 tablet (20 mg total) by mouth daily. 90 tablet 1  . triamcinolone (KENALOG) 0.1 % APPLY TOPICALLY TWICE DAILY 30 g 0  . Vitamin D, Ergocalciferol, (DRISDOL) 1.25 MG (50000 UT) CAPS capsule Take 1 capsule (50,000 Units total) by mouth every 7 (seven) days. 12 capsule 0   No current facility-administered medications on file prior to visit.    Observations/Objective: Alert, NAD, appropriate mood and affect, resps normal, cn 2-12 intact, moves all  4s, no visible rash or swelling Lab Results  Component Value Date   WBC 7.7 09/06/2019   HGB 13.3 09/06/2019   HCT 39.6 09/06/2019   PLT 166.0 09/06/2019   GLUCOSE 105 (H) 09/06/2019   CHOL 132 09/06/2019   TRIG 189.0 (H) 09/06/2019   HDL 40.50 09/06/2019   LDLDIRECT 84.1 01/31/2014   LDLCALC 54 09/06/2019   ALT 10 09/06/2019   AST 13 09/06/2019   NA 135 09/06/2019   K 4.4 09/06/2019   CL 102 09/06/2019   CREATININE 0.95 09/06/2019   BUN 13 09/06/2019   CO2 28 09/06/2019   TSH 0.94 09/06/2019   INR 1.16 04/30/2018   HGBA1C 6.0 09/06/2019   Assessment and Plan: See notes  Follow Up Instructions: See notes   I discussed the assessment and treatment plan with the patient. The patient was provided an opportunity to ask questions and all were answered. The patient agreed with the plan and demonstrated an understanding of the instructions.   The patient was advised to call back or seek an in-person evaluation if the symptoms worsen or if the condition fails to improve as anticipated.  Oliver Barre, MD

## 2020-04-27 ENCOUNTER — Encounter: Payer: Self-pay | Admitting: Internal Medicine

## 2020-04-27 NOTE — Patient Instructions (Signed)
Please take all new medication as prescribed 

## 2020-04-27 NOTE — Assessment & Plan Note (Signed)
Lab Results  Component Value Date   HGBA1C 6.0 09/06/2019   Stable, pt to continue current medical treatment  - diet

## 2020-04-27 NOTE — Assessment & Plan Note (Signed)
Mild to mod, c/w bronchitis vs pna, declines cxr, for antibx course, cough med prn,  to f/u any worsening symptoms or concerns 

## 2020-04-27 NOTE — Assessment & Plan Note (Signed)
Also for predpac asd, . to f/u any worsening symptoms or concerns 

## 2020-04-28 ENCOUNTER — Other Ambulatory Visit: Payer: Self-pay | Admitting: Internal Medicine

## 2020-04-28 NOTE — Telephone Encounter (Signed)
Please refill as per office routine med refill policy (all routine meds refilled for 3 mo or monthly per pt preference up to one year from last visit, then month to month grace period for 3 mo, then further med refills will have to be denied)  

## 2020-05-05 ENCOUNTER — Other Ambulatory Visit: Payer: Self-pay | Admitting: Internal Medicine

## 2020-05-05 NOTE — Telephone Encounter (Signed)
Please refill as per office routine med refill policy (all routine meds refilled for 3 mo or monthly per pt preference up to one year from last visit, then month to month grace period for 3 mo, then further med refills will have to be denied)  

## 2020-05-12 ENCOUNTER — Other Ambulatory Visit: Payer: Self-pay | Admitting: Internal Medicine

## 2020-05-19 ENCOUNTER — Telehealth: Payer: Self-pay | Admitting: Cardiology

## 2020-05-19 NOTE — Telephone Encounter (Signed)
Returned call to patient-she states she called Leah Mata and was informed her portion was not faxed.    Advised nurse is OOO today but will send message to follow up tomorrow.  Patient aware.

## 2020-05-19 NOTE — Telephone Encounter (Signed)
° ° °  Pt is calling back to follow up application sent to [facility], she said she spoke with them and was told the papers is incomplete. She would like to speak with Dr. Di Kindle nurse for help

## 2020-05-20 NOTE — Telephone Encounter (Signed)
Spoke with rep from [employer] who report both pt and providers portion is on file and still currently processing. She report they are currently delayed and working to process each application.  Attempted to contact pt. Left message to call back.

## 2020-05-20 NOTE — Telephone Encounter (Signed)
Please see previous encounter

## 2020-05-21 NOTE — Telephone Encounter (Signed)
Patient is returning call.  °

## 2020-05-21 NOTE — Telephone Encounter (Signed)
Spoke to patient she stated she called Leah Mata yesterday and was told they did not have her portion of application.Stated she is almost out of Eliquis.Advised I will leave Eliquis 5 mg samples Lot # [id number] Exp 06/2022 at front desk.I will send message to Dr.Christopher's RN.

## 2020-05-21 NOTE — Telephone Encounter (Signed)
Pt state she spoke with [facility] and they confirmed they have the completed application and pt should receive a decision by the end of the week.

## 2020-05-22 NOTE — Telephone Encounter (Signed)
Received fax from [facility] Patient Assistance stating Eliquis has been approved from 05/22/20-03/28/21.  Attempted to contact pt. Unable to leave message

## 2020-06-04 ENCOUNTER — Other Ambulatory Visit: Payer: Self-pay | Admitting: Internal Medicine

## 2020-06-04 MED ORDER — TRIAMCINOLONE ACETONIDE 0.1 % EX CREA: 1.0000 "application " | TOPICAL_CREAM | Freq: Two times a day (BID) | CUTANEOUS | 0 refills | Status: AC

## 2020-08-26 ENCOUNTER — Other Ambulatory Visit: Payer: Self-pay | Admitting: Internal Medicine

## 2020-08-30 ENCOUNTER — Other Ambulatory Visit: Payer: Self-pay

## 2020-08-30 ENCOUNTER — Emergency Department (HOSPITAL_BASED_OUTPATIENT_CLINIC_OR_DEPARTMENT_OTHER)
Admission: EM | Admit: 2020-08-30 | Discharge: 2020-08-30 | Disposition: A | Discharge status: Home / Self Care | Payer: Medicare HMO | Attending: Emergency Medicine | Admitting: Emergency Medicine

## 2020-08-30 ENCOUNTER — Emergency Department (HOSPITAL_BASED_OUTPATIENT_CLINIC_OR_DEPARTMENT_OTHER): Payer: Medicare HMO

## 2020-08-30 ENCOUNTER — Encounter (HOSPITAL_BASED_OUTPATIENT_CLINIC_OR_DEPARTMENT_OTHER): Payer: Self-pay | Admitting: Emergency Medicine

## 2020-08-30 DIAGNOSIS — W010XXA Fall on same level from slipping, tripping and stumbling without subsequent striking against object, initial encounter: Secondary | ICD-10-CM | POA: Diagnosis not present

## 2020-08-30 DIAGNOSIS — S52602A Unspecified fracture of lower end of left ulna, initial encounter for closed fracture: Secondary | ICD-10-CM | POA: Diagnosis not present

## 2020-08-30 DIAGNOSIS — Y9301 Activity, walking, marching and hiking: Secondary | ICD-10-CM | POA: Insufficient documentation

## 2020-08-30 DIAGNOSIS — S6991XA Unspecified injury of right wrist, hand and finger(s), initial encounter: Secondary | ICD-10-CM

## 2020-08-30 DIAGNOSIS — S52531A Colles' fracture of right radius, initial encounter for closed fracture: Secondary | ICD-10-CM | POA: Insufficient documentation

## 2020-08-30 DIAGNOSIS — S52502A Unspecified fracture of the lower end of left radius, initial encounter for closed fracture: Secondary | ICD-10-CM | POA: Diagnosis not present

## 2020-08-30 DIAGNOSIS — J441 Chronic obstructive pulmonary disease with (acute) exacerbation: Secondary | ICD-10-CM | POA: Insufficient documentation

## 2020-08-30 DIAGNOSIS — Z8616 Personal history of COVID-19: Secondary | ICD-10-CM | POA: Diagnosis not present

## 2020-08-30 DIAGNOSIS — M25531 Pain in right wrist: Secondary | ICD-10-CM

## 2020-08-30 DIAGNOSIS — Z043 Encounter for examination and observation following other accident: Secondary | ICD-10-CM | POA: Diagnosis not present

## 2020-08-30 DIAGNOSIS — I5032 Chronic diastolic (congestive) heart failure: Secondary | ICD-10-CM | POA: Diagnosis not present

## 2020-08-30 DIAGNOSIS — R55 Syncope and collapse: Secondary | ICD-10-CM | POA: Diagnosis not present

## 2020-08-30 DIAGNOSIS — Z87891 Personal history of nicotine dependence: Secondary | ICD-10-CM | POA: Diagnosis not present

## 2020-08-30 DIAGNOSIS — Z7901 Long term (current) use of anticoagulants: Secondary | ICD-10-CM | POA: Insufficient documentation

## 2020-08-30 DIAGNOSIS — S52611A Displaced fracture of right ulna styloid process, initial encounter for closed fracture: Secondary | ICD-10-CM | POA: Insufficient documentation

## 2020-08-30 DIAGNOSIS — S52501A Unspecified fracture of the lower end of right radius, initial encounter for closed fracture: Secondary | ICD-10-CM | POA: Diagnosis not present

## 2020-08-30 DIAGNOSIS — S52601A Unspecified fracture of lower end of right ulna, initial encounter for closed fracture: Secondary | ICD-10-CM | POA: Diagnosis not present

## 2020-08-30 DIAGNOSIS — S6291XD Unspecified fracture of right wrist and hand, subsequent encounter for fracture with routine healing: Secondary | ICD-10-CM | POA: Diagnosis not present

## 2020-08-30 DIAGNOSIS — I110 Hypertensive heart disease with heart failure: Secondary | ICD-10-CM | POA: Diagnosis not present

## 2020-08-30 DIAGNOSIS — Z79899 Other long term (current) drug therapy: Secondary | ICD-10-CM | POA: Insufficient documentation

## 2020-08-30 LAB — CBC WITH DIFFERENTIAL/PLATELET
Abs Immature Granulocytes: 0.05 10*3/uL (ref 0.00–0.07)
Basophils Absolute: 0.1 10*3/uL (ref 0.0–0.1)
Basophils Relative: 0 %
Eosinophils Absolute: 0.1 10*3/uL (ref 0.0–0.5)
Eosinophils Relative: 1 %
HCT: 41.6 % (ref 36.0–46.0)
Hemoglobin: 13.7 g/dL (ref 12.0–15.0)
Immature Granulocytes: 0 %
Lymphocytes Relative: 13 %
Lymphs Abs: 1.6 10*3/uL (ref 0.7–4.0)
MCH: 31.5 pg (ref 26.0–34.0)
MCHC: 32.9 g/dL (ref 30.0–36.0)
MCV: 95.6 fL (ref 80.0–100.0)
Monocytes Absolute: 0.8 10*3/uL (ref 0.1–1.0)
Monocytes Relative: 6 %
Neutro Abs: 10 10*3/uL — ABNORMAL HIGH (ref 1.7–7.7)
Neutrophils Relative %: 80 %
Platelets: 197 10*3/uL (ref 150–400)
RBC: 4.35 MIL/uL (ref 3.87–5.11)
RDW: 12.1 % (ref 11.5–15.5)
WBC: 12.6 10*3/uL — ABNORMAL HIGH (ref 4.0–10.5)
nRBC: 0 % (ref 0.0–0.2)

## 2020-08-30 LAB — BASIC METABOLIC PANEL
Anion gap: 10 (ref 5–15)
BUN: 16 mg/dL (ref 8–23)
CO2: 25 mmol/L (ref 22–32)
Calcium: 9.6 mg/dL (ref 8.9–10.3)
Chloride: 100 mmol/L (ref 98–111)
Creatinine, Ser: 1.05 mg/dL — ABNORMAL HIGH (ref 0.44–1.00)
GFR, Estimated: 56 mL/min — ABNORMAL LOW (ref >60)
Glucose, Bld: 117 mg/dL — ABNORMAL HIGH (ref 70–99)
Potassium: 4.6 mmol/L (ref 3.5–5.1)
Sodium: 135 mmol/L (ref 135–145)

## 2020-08-30 MED ORDER — ONDANSETRON HCL 4 MG/2ML IJ SOLN
4.0000 mg | Freq: Once | INTRAMUSCULAR | Status: AC
  Administered 2020-08-30: 4 mg via INTRAVENOUS
  Filled 2020-08-30: qty 2

## 2020-08-30 MED ORDER — FENTANYL CITRATE (PF) 100 MCG/2ML IJ SOLN
50.0000 ug | Freq: Once | INTRAMUSCULAR | Status: AC
Start: 2020-08-30 — End: 2020-08-30
  Administered 2020-08-30: 50 ug via INTRAVENOUS
  Filled 2020-08-30: qty 2

## 2020-08-30 MED ORDER — OXYCODONE HCL 5 MG PO TABS: 5.0000 mg | ORAL_TABLET | Freq: Four times a day (QID) | ORAL | 0 refills | Status: DC | PRN

## 2020-08-30 MED ORDER — ONDANSETRON HCL 4 MG PO TABS: 4.0000 mg | ORAL_TABLET | Freq: Three times a day (TID) | ORAL | 0 refills | Status: DC | PRN

## 2020-08-30 MED ORDER — LIDOCAINE HCL (PF) 1 % IJ SOLN
20.0000 mL | Freq: Once | INTRAMUSCULAR | Status: AC
  Administered 2020-08-30: 20 mL
  Filled 2020-08-30: qty 20

## 2020-08-30 NOTE — ED Notes (Signed)
Hand MD Janee Morn at bedside.

## 2020-08-30 NOTE — ED Provider Notes (Signed)
Signed out to me with right wrist pain after she fell the day.  Had a near syncopal type event while folding laundry.  She got dizzy when she moved to try to pick up the phone and fell and landed on her right wrist.  She did not lose consciousness or hit her head.  She is on blood thinner.  She is fairly adamant that she did not hit her head.  She not having any headache or neck pain.  Shared decision was made to hold off on a head CT.  Lab work is unremarkable.  I have taken over to perform risk reduction for right wrist Colles' fracture.  Hematoma block was performed then reduction was performed.  Overall there appears to be interval improvement of the dorsal aspect of the fracture but still with impaction.  Talked with Dr. Janee Morn who came to evaluate the patient and will plan to take her to the OR later this week.  Eliquis will need to be held.  She is in a sinus rhythm and she takes Eliquis for A. fib.  Patient overall appears well.  She has been neurovascularly intact before and after reduction.  Discharged in good condition.  She understands splint care and written for pain medicine.  This chart was dictated using voice recognition software.  Despite best efforts to proofread,  errors can occur which can change the documentation meaning.  [facility]  Date/Time: 08/30/2020 5:17 PM Performed by: Virgina Norfolk, DO Authorized by: Virgina Norfolk, DO   Consent:    Consent obtained:  Written   Consent given by:  Patient   Risks discussed:  Fracture, nerve damage, recurrent dislocation, irreducible dislocation, restricted joint movement, stiffness and vascular damageInjury location: wrist Location details: right wrist Injury type: fracture Fracture type: distal radius and ulnar styloid Pre-procedure neurovascular assessment: neurovascularly intact Pre-procedure distal perfusion: normal Pre-procedure neurological function: normal Pre-procedure range of motion: reduced Anesthesia:  hematoma block  Anesthesia: Local anesthesia used: yes Local Anesthetic: lidocaine 1% without epinephrine Anesthetic total: 10 mL  Patient sedated: NoManipulation performed: yes Skin traction used: yes Skeletal traction used: yes Reduction successful: yes X-ray confirmed reduction: yes Immobilization: splint Splint type: sugar tong Splint Applied by: ED Provider Supplies used: Ortho-Glass Post-procedure neurovascular assessment: post-procedure neurovascularly intact Post-procedure distal perfusion: normal Post-procedure neurological function: normal Post-procedure range of motion comment: splint Comments: X-ray shows interval improvement of dorsal displacement but continued impaction       Virgina Norfolk, DO 08/30/20 1719

## 2020-08-30 NOTE — Consult Note (Signed)
ORTHOPAEDIC CONSULTATION HISTORY & PHYSICAL REQUESTING PHYSICIAN: Virgina Norfolk, DO  Chief Complaint:  Right wrist injury  HPI: Leah Mata is a 75 y.o. female who sustained a mechanical fall earlier today at home.  She presented to emergency department with right wrist pain and swelling.  She indicates that she has been dizzy sometimes at nighttime, but not during the daytime and was dizzy leading to this fall.  She is undergoing work-up for that by the EDP.  She remains on Eliquis, she thinks for A. fib, but also had AAA repair 2 years ago with a graft.  Her cardiologist is Dr. Cristal Deer.  EDP has performed a provisional reduction and sugar-tong splint application, gaining improvement in fracture parameters.  Past Medical History:  Diagnosis Date  . BACK PAIN 03/14/2009  . COPD 11/01/2006  . GLUCOSE INTOLERANCE 03/14/2009  . Headache(784.0) 03/14/2009  . HYPERTENSION 11/01/2006  . 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 INSOMNIA, HX OF 01/06/2007  . Other and unspecified hyperlipidemia 08/22/2013  . Urinary frequency 03/14/2009   Past Surgical History:  Procedure Laterality Date  . ABDOMINAL AORTIC ANEURYSM REPAIR N/A 04/28/2018   Procedure: ANEURYSM ABDOMINAL AORTIC REPAIR WITH HEMASHIELD GOLD VASCULAR GRAFT 22X11 mm;  Surgeon: Larina Earthly, MD;  Location: [facility] OR;  Service: Vascular;  Laterality: N/A;  . CARDIAC CATHETERIZATION  2003   negative  . DILATION AND CURETTAGE OF UTERUS    . TONSILLECTOMY    . TUBAL LIGATION     Social History   Socioeconomic History  . Marital status: Widowed    Spouse name: Not on file  . Number of children: Not on file  . Years of education: Not on file  . Highest education level: Not on file  Occupational History  . Not on file  Tobacco Use  . Smoking status: Former Smoker    Types: Cigarettes  . Smokeless tobacco: Never Used  . Tobacco comment: 2 packs per week  Vaping Use  . Vaping Use: Never used  Substance and Sexual Activity   . Alcohol use: No  . Drug use: No  . Sexual activity: Not Currently    Birth control/protection: Surgical    Comment: BTL  Other Topics Concern  . Not on file  Social History Narrative  . Not on file   Social Determinants of Health   Financial Resource Strain: Not on file  Food Insecurity: Not on file  Transportation Needs: Not on file  Physical Activity: Not on file  Stress: Not on file  Social Connections: Not on file   Family History  Problem Relation Age of Onset  . Coronary artery disease Other        female 1st degree relative <60 and female 1st degree relative <50  . Diabetes Father   . Heart attack Father   . Diabetes Mother   . Hypertension Mother   . Congestive Heart Failure Mother   . Colon cancer Neg Hx   . Breast cancer Neg Hx    Allergies  Allergen Reactions  . Hydrochlorothiazide Other (See Comments)    unknown   Prior to Admission medications   Medication Sig Start Date End Date Taking? Authorizing Provider  ALPRAZolam Prudy Feeler) 0.25 MG tablet TAKE 1 TABLET BY MOUTH TWICE A DAY AS NEEDED FOR ANXIETY 03/24/20  Yes Corwin Levins, MD  amLODipine (NORVASC) 10 MG tablet Take 1 tablet (10 mg total) by mouth daily. 05/06/20  Yes Corwin Levins, MD  apixaban (ELIQUIS) 5 MG TABS  tablet Take 1 tablet (5 mg total) by mouth 2 (two) times daily. 07/26/18  Yes Jodelle Red, MD  citalopram (CELEXA) 10 MG tablet TAKE 1 TABLET EVERY DAY 08/26/20  Yes Corwin Levins, MD  fluticasone [facility] 50 MCG/ACT nasal spray Place 1 spray into both nostrils as needed for allergies or rhinitis.   Yes [provider]  glycerin adult 2 g suppository  09/03/14  Yes [provider]  levalbuterol (XOPENEX) 1.25 MG/0.5ML nebulizer solution Take 1.25 mg by nebulization every 6 (six) hours as needed for wheezing or shortness of breath. 06/16/18  Yes Corwin Levins, MD  lisinopril (ZESTRIL) 20 MG tablet TAKE 1 TABLET TWICE DAILY 03/13/20  Yes Jodelle Red, MD   metoprolol tartrate (LOPRESSOR) 50 MG tablet TAKE 1 TABLET TWICE DAILY 04/28/20  Yes Corwin Levins, MD  pravastatin (PRAVACHOL) 20 MG tablet TAKE 1 TABLET EVERY DAY 08/26/20  Yes Corwin Levins, MD  triamcinolone (KENALOG) 0.1 % APPLY TOPICALLY TWICE DAILY 05/12/20  Yes Corwin Levins, MD  triamcinolone (KENALOG) 0.1 % Apply 1 application topically 2 (two) times daily. 06/04/20 06/04/21 Yes Corwin Levins, MD  Vitamin D, Ergocalciferol, (DRISDOL) 1.25 MG (50000 UT) CAPS capsule Take 1 capsule (50,000 Units total) by mouth every 7 (seven) days. 09/06/18  Yes Corwin Levins, MD  predniSONE (DELTASONE) 10 MG tablet 3 tabs by mouth per day for 3 days,2tabs per day for 3 days,1tab per day for 3 days 04/21/20   Corwin Levins, MD  promethazine-codeine [facility] WITH CODEINE) 6.25-10 MG/5ML syrup Take 5 mLs by mouth every 6 (six) hours as needed for cough. 04/21/20   Corwin Levins, MD   DG Chest 1 View  Result Date: 08/30/2020 CLINICAL DATA:  Syncope and subsequent fall. EXAM: CHEST  1 VIEW COMPARISON:  May 12, 2018 FINDINGS: There is no evidence of acute infiltrate, pleural effusion or pneumothorax. The heart size and mediastinal contours are within normal limits. There is moderate severity calcification of the thoracic aorta. The visualized skeletal structures are unremarkable. IMPRESSION: No acute cardiopulmonary disease. Electronically Signed   By: Aram Candela M.D.   On: 08/30/2020 15:37   DG Wrist Complete Right  Result Date: 08/30/2020 CLINICAL DATA:  Status post syncope. EXAM: RIGHT WRIST - COMPLETE 3+ VIEW COMPARISON:  None. FINDINGS: An acute, comminuted fracture deformity is seen involving the distal right radius. There is extension to involve the radiocarpal articulation. Approximately 1/2 shaft width dorsal displacement of the distal fracture site is seen. An acute fracture of the right ulnar styloid is also noted. There is no evidence of dislocation. Moderate severity diffuse soft tissue swelling is  present. IMPRESSION: Colles fracture of the distal right radius with an acute fracture of the right ulnar styloid. Electronically Signed   By: Aram Candela M.D.   On: 08/30/2020 15:34    Positive ROS: All other systems have been reviewed and were otherwise negative with the exception of those mentioned in the HPI and as above.  Physical Exam: Vitals: Refer to EMR. Constitutional:  WD, WN, NAD HEENT:  NCAT, EOMI Neuro/Psych:  Alert & oriented to person, place, and time; appropriate mood & affect Lymphatic: No generalized extremity edema or lymphadenopathy Extremities / MSK:  The extremities are normal with respect to appearance, ranges of motion, joint stability, muscle strength/tone, sensation, & perfusion except as otherwise noted:  Sugar-tong splint is in place.  The digits are not very swollen.  Brisk capillary refill.  Intact light touch sensibility in  the median, ulnar, and radial nerve distribution with intact motor to the same.  No tenderness about the elbow to the extent palpable.  Assessment: Displaced comminuted closed right intra-articular distal radius fracture with an ulnar styloid fracture, with residual shortening, mild dorsal translation and moderate tilt  Plan/Recommendations: I discussed these findings with her and her family.  I recommended operative treatment to obtain and maintain a more anatomic alignment and reduce the risk for ongoing and chronic pain, particularly ulnar-sided pain and limitations in forearm rotation.  For the rest of the weekend she plans to hold Eliquis because of the acute fracture, and she will seek guidance from her cardiologist on Monday.  Our office will do the same as we begin plans for skeletal reconstruction, hopefully either for Wednesday or Friday.  Locked volar plate fixation specifically was discussed, focusing on the goals, options, risks and alternatives.  Verbal consent was obtained.  Elevation and gentle motion exercises were  reinforced.  Our surgery coordinator will contact the patient Monday to make arrangements for continued care.  Cliffton Asters Janee Morn, MD      Orthopaedic & Hand Surgery [facility] [address] [place], [place]  [address] Office: [phone] Mobile: [phone]  08/30/2020, 4:33 PM

## 2020-08-30 NOTE — ED Triage Notes (Signed)
Pt was walking to answer phone, next thing she remembers is she is getting herself out of the floor. Unknown down time, thinks few minutes. Pt reports right shoulder and right ankle pain.

## 2020-08-30 NOTE — Discharge Instructions (Addendum)
Wrist Fracture Treated With Immobilization  A wrist fracture is a break or crack in one of the bones of the wrist. The wrist is made up of eight small bones at the palm of the hand (carpal bones) and two long bones that make up the forearm (radius and ulna). If the joint is stable and the bones are still in their normal position (nondisplaced), the injury may be treated with immobilization. This involves the use of a cast, splint, or sling to hold the wrist in place. Immobilization ensures that the bones continue to stay in the correct position while the wrist is healing. What are the causes? This condition may be caused by:  A direct force to the wrist.  Trauma, such as a car crash or falling on an outstretched hand. What increases the risk? The following factors may make you more likely to develop this condition:  Doing contact sports or high-risk sports such as skiing, biking, and ice skating.  Taking steroid medicines.  Smoking.  Being female.  Being Caucasian.  Drinking more than three alcoholic beverages a day.  Having a condition that weakens the bones (osteoporosis).  Being older.  Having a history of previous fractures. What are the signs or symptoms? Symptoms of this condition include:  Pain.  Swelling.  Bruising.  Not being able to move the wrist normally. The wrist may also hang in an odd position or appear deformed. How is this diagnosed? This condition may be diagnosed based on a physical exam and X-rays. You may also have a CT scan or MRI. How is this treated? Treatment for this condition involves wearing a cast, splint, or sling until the injured area is stable enough for you to begin range-of-motion exercises. You may also be prescribed pain medicine. Follow these instructions at home: If you have a cast:  Do not stick anything inside the cast to scratch your skin. Doing that increases your risk of infection.  Check the skin around the cast every  day. Tell your health care provider about any concerns.  You may put lotion on dry skin around the edges of the cast. Do not put lotion on the skin underneath the cast.  Keep the cast clean and dry. If you have a splint or sling:  Wear the splint or sling as told by your health care provider. Remove it only as told by your health care provider.  Loosen the splint or sling if your fingers tingle, become numb, or turn cold and blue.  Keep the splint or sling clean and dry. Bathing  Do not take baths, swim, or use a hot tub until your health care provider approves. Ask your health care provider if you may take showers. You may only be allowed to take sponge baths.  If your cast, splint, or sling is not waterproof: ? Do not let it get wet. ? Cover it with a watertight covering when you take a bath or a shower.  If you have a sling, remove it for bathing only if your health care provider tells you it is safe to do that. Managing pain, stiffness, and swelling  If directed, put ice on the injured area. To do this: ? If you have a removable splint or sling, remove it as told by your health care provider. ? Put ice in a plastic bag. ? Place a towel between your skin and the bag or between your cast and the bag. ? Leave the ice on for 20 minutes, 2-3 times  a day. ? Remove the ice if your skin turns bright red. This is very important. If you cannot feel pain, heat, or cold, you have a greater risk of damage to the area.  Move your fingers often to reduce stiffness and swelling.  Raise (elevate) the injured area above the level of your heart while you are sitting or lying down.   Activity  Return to your normal activities as told by your health care provider. Ask your health care provider what activities are safe for you.  Do not lift with or put weight on your injured wrist until your health care provider approves.  Ask your health care provider when it is safe to drive if you have a  cast, splint, or sling on your wrist.  Do range-of-motion exercises only as told by your health care provider or physical therapist. Medicines  Take over-the-counter and prescription medicines only as told by your health care provider.  Ask your health care provider if the medicine prescribed to you: ? Requires you to avoid driving or using machinery. ? Can cause constipation. You may need to take these actions to prevent or treat constipation:  Drink enough fluid to keep your urine pale yellow.  Take over-the-counter or prescription medicines.  Eat foods that are high in fiber, such as beans, whole grains, and fresh fruits and vegetables.  Limit foods that are high in fat and processed sugars, such as fried or sweet foods. General instructions  Do not put pressure on any part of the cast or splint until it is fully hardened. This may take several hours.  Do not use any products that contain nicotine or tobacco, such as cigarettes, e-cigarettes, and chewing tobacco. These can delay bone healing. If you need help quitting, ask your health care provider.  Keep all follow-up visits. This is important. Contact a health care provider if:  Your cast, splint, or sling is damaged or loose.  You have any new pain, swelling, or bruising.  Your pain, swelling, and bruising do not improve.  You have a fever.  You have chills. Get help right away if:  Your skin or fingers on your injured arm turn blue or gray.  Your arm feels cold or numb.  You have severe pain in your injured wrist. Summary  A wrist fracture is a break or crack in one of the bones of the wrist.  If the joint is stable and the bones are still in their normal position, the injury may be treated by wearing a cast, splint, or sling.  If you have a cast, check the skin around the cast every day. Tell your health care provider about any concerns.  If you have a splint or sling, wear it as told by your health care  provider. Remove it only as told by your health care provider. This information is not intended to replace advice given to you by your health care provider. Make sure you discuss any questions you have with your health care provider. Document Revised: 06/26/2019 Document Reviewed: 06/26/2019 Elsevier Patient Education  2021 [employer].

## 2020-08-30 NOTE — ED Provider Notes (Signed)
[facility] EMERGENCY DEPT Provider Note   CSN: [id number] Arrival date & time: 08/30/20  1351     History Chief Complaint  Patient presents with  . Shoulder Injury    Leah Mata is a 75 y.o. female.  Presented to ER with concern for syncopal episode, fall.  Patient states that she remembers getting up to answer the phone when she thinks she passed out and landed on the floor.  States that she landed on her right side, denies any head trauma.  Feels quite confident that she did not hit her head.  Initially had slight pain in her shoulder but now is having pain isolated to her right wrist.  Noted some swelling and deformity to her wrist.  Wrist pain is worse with movement, improved with rest, currently moderate.  Patient has history of A. fib, on Eliquis, AAA s/p repair  HPI     Past Medical History:  Diagnosis Date  . BACK PAIN 03/14/2009  . COPD 11/01/2006  . GLUCOSE INTOLERANCE 03/14/2009  . Headache(784.0) 03/14/2009  . HYPERTENSION 11/01/2006  . 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 INSOMNIA, HX OF 01/06/2007  . Other and unspecified hyperlipidemia 08/22/2013  . Urinary frequency 03/14/2009    Patient Active Problem List   Diagnosis Date Noted  . Rash 09/06/2019  . Abdominal wall hernia 09/06/2019  . History of 2019 novel coronavirus disease (COVID-19) 03/09/2019  . Chronic diastolic heart failure (HCC) 03/09/2019  . Pure hypercholesterolemia 03/09/2019  . Depression 03/08/2019  . Vitamin D deficiency 03/08/2019  . Atrial flutter, paroxysmal (HCC) 06/20/2018  . History of AAA (abdominal aortic aneurysm) repair 06/20/2018  . Diastolic dysfunction 06/06/2018  . Constipation 06/06/2018  . Anxiety 06/06/2018  . Pressure injury of skin 05/23/2018  . Hypoxemia   . Pulmonary infiltrates   . Paroxysmal atrial fibrillation (HCC)   . AAA (abdominal aortic aneurysm) (HCC) 04/29/2018  . Left arm pain 11/17/2017  . Skin lesion of face 09/07/2016  .  Allergic rhinitis 09/07/2016  . Microhematuria 03/03/2016  . Hypercalcemia 08/12/2015  . Cough 12/19/2014  . Bilateral hearing loss 12/19/2014  . Hyperlipidemia 08/22/2013  . COPD exacerbation (HCC) 02/24/2011  . 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 Pre-diabetes 08/19/2010  . Encounter for well adult exam with abnormal findings 08/19/2010  . INSOMNIA-SLEEP DISORDER-UNSPEC 06/05/2009  . BACK PAIN 03/14/2009  . Essential hypertension 11/01/2006  . COPD (chronic obstructive pulmonary disease) (HCC) 11/01/2006    Past Surgical History:  Procedure Laterality Date  . ABDOMINAL AORTIC ANEURYSM REPAIR N/A 04/28/2018   Procedure: ANEURYSM ABDOMINAL AORTIC REPAIR WITH HEMASHIELD GOLD VASCULAR GRAFT 22X11 mm;  Surgeon: Larina Earthly, MD;  Location: [facility] OR;  Service: Vascular;  Laterality: N/A;  . CARDIAC CATHETERIZATION  2003   negative  . DILATION AND CURETTAGE OF UTERUS    . TONSILLECTOMY    . TUBAL LIGATION       OB History    Gravida  2   Para  2   Term  0   Preterm  0   AB  0   Living  2     SAB  0   IAB  0   Ectopic  0   Multiple  0   Live Births  2           Family History  Problem Relation Age of Onset  . Coronary artery disease Other        female 1st degree relative <60 and female 1st degree relative <  50  . Diabetes Father   . Heart attack Father   . Diabetes Mother   . Hypertension Mother   . Congestive Heart Failure Mother   . Colon cancer Neg Hx   . Breast cancer Neg Hx     Social History   Tobacco Use  . Smoking status: Former Smoker    Types: Cigarettes  . Smokeless tobacco: Never Used  . Tobacco comment: 2 packs per week  Vaping Use  . Vaping Use: Never used  Substance Use Topics  . Alcohol use: No  . Drug use: No    Home Medications Prior to Admission medications   Medication Sig Start Date End Date Taking? Authorizing Provider  ALPRAZolam Prudy Feeler) 0.25 MG tablet TAKE 1 TABLET BY MOUTH TWICE A DAY AS NEEDED FOR ANXIETY 03/24/20   Yes Corwin Levins, MD  amLODipine (NORVASC) 10 MG tablet Take 1 tablet (10 mg total) by mouth daily. 05/06/20  Yes Corwin Levins, MD  apixaban (ELIQUIS) 5 MG TABS tablet Take 1 tablet (5 mg total) by mouth 2 (two) times daily. 07/26/18  Yes Jodelle Red, MD  citalopram (CELEXA) 10 MG tablet TAKE 1 TABLET EVERY DAY 08/26/20  Yes Corwin Levins, MD  fluticasone [facility]) 50 MCG/ACT nasal spray Place 1 spray into both nostrils as needed for allergies or rhinitis.   Yes [provider]  glycerin adult 2 g suppository  09/03/14  Yes [provider]  levalbuterol (XOPENEX) 1.25 MG/0.5ML nebulizer solution Take 1.25 mg by nebulization every 6 (six) hours as needed for wheezing or shortness of breath. 06/16/18  Yes Corwin Levins, MD  lisinopril (ZESTRIL) 20 MG tablet TAKE 1 TABLET TWICE DAILY 03/13/20  Yes Jodelle Red, MD  metoprolol tartrate (LOPRESSOR) 50 MG tablet TAKE 1 TABLET TWICE DAILY 04/28/20  Yes Corwin Levins, MD  pravastatin (PRAVACHOL) 20 MG tablet TAKE 1 TABLET EVERY DAY 08/26/20  Yes Corwin Levins, MD  triamcinolone (KENALOG) 0.1 % APPLY TOPICALLY TWICE DAILY 05/12/20  Yes Corwin Levins, MD  triamcinolone (KENALOG) 0.1 % Apply 1 application topically 2 (two) times daily. 06/04/20 06/04/21 Yes Corwin Levins, MD  Vitamin D, Ergocalciferol, (DRISDOL) 1.25 MG (50000 UT) CAPS capsule Take 1 capsule (50,000 Units total) by mouth every 7 (seven) days. 09/06/18  Yes Corwin Levins, MD  predniSONE (DELTASONE) 10 MG tablet 3 tabs by mouth per day for 3 days,2tabs per day for 3 days,1tab per day for 3 days 04/21/20   Corwin Levins, MD  promethazine-codeine [facility] WITH CODEINE) 6.25-10 MG/5ML syrup Take 5 mLs by mouth every 6 (six) hours as needed for cough. 04/21/20   Corwin Levins, MD    Allergies    Hydrochlorothiazide  Review of Systems   Review of Systems  Constitutional: Negative for chills and fever.  HENT: Negative for ear pain and sore throat.   Eyes: Negative for  pain and visual disturbance.  Respiratory: Negative for cough and shortness of breath.   Cardiovascular: Negative for chest pain and palpitations.  Gastrointestinal: Negative for abdominal pain and vomiting.  Genitourinary: Negative for dysuria and hematuria.  Musculoskeletal: Positive for arthralgias. Negative for back pain.  Skin: Negative for color change and rash.  Neurological: Positive for syncope. Negative for seizures.  All other systems reviewed and are negative.   Physical Exam Updated Vital Signs BP (!) 143/68   Pulse (!) 56   Temp 97.7 F (36.5 C) (Oral)   Resp 16   LMP 03/29/1993 (  Approximate)   SpO2 99%   Physical Exam Vitals and nursing note reviewed.  Constitutional:      General: She is not in acute distress.    Appearance: She is well-developed.  HENT:     Head: Normocephalic and atraumatic.     Comments: On very careful inspection of head, face, no tenderness to palpation, no deformity visualized or palpated Eyes:     Conjunctiva/sclera: Conjunctivae normal.  Cardiovascular:     Rate and Rhythm: Normal rate and regular rhythm.     Heart sounds: No murmur heard.   Pulmonary:     Effort: Pulmonary effort is normal. No respiratory distress.     Breath sounds: Normal breath sounds.  Abdominal:     Palpations: Abdomen is soft.     Tenderness: There is no abdominal tenderness.  Musculoskeletal:     Cervical back: Neck supple.     Comments: Back: no C, T, L spine TTP, no step off or deformity RUE: There is tenderness over the wrist, deformity to the wrist, no other tenderness throughout remainder of extremity, radial pulse intact, distal sensation and motor intact LUE: no TTP throughout, no deformity, normal joint ROM, radial pulse intact, distal sensation and motor intact RLE:  no TTP throughout, no deformity, normal joint ROM, distal pulse, sensation and motor intact LLE: no TTP throughout, no deformity, normal joint ROM, distal pulse, sensation and motor  intact  Skin:    General: Skin is warm and dry.     Capillary Refill: Capillary refill takes less than 2 seconds.  Neurological:     General: No focal deficit present.     Mental Status: She is alert.  Psychiatric:        Mood and Affect: Mood normal.     ED Results / Procedures / Treatments   Labs (all labs ordered are listed, but only abnormal results are displayed) Labs Reviewed  CBC WITH DIFFERENTIAL/PLATELET  BASIC METABOLIC PANEL    EKG None  Radiology No results found.  Procedures Procedures   Medications Ordered in ED Medications  fentaNYL (SUBLIMAZE) injection 50 mcg (has no administration in time range)  lidocaine (PF) (XYLOCAINE) 1 % injection 20 mL (has no administration in time range)    ED Course  I have reviewed the triage vital signs and the nursing notes.  Pertinent labs & imaging results that were available during my care of the patient were reviewed by me and considered in my medical decision making (see chart for details).    MDM Rules/Calculators/A&P                         75 year old lady presents to ER with concern for syncopal episode, wrist pain.  On exam, patient appears well, noted to have deformity to her right wrist.  Will check basic labs, chest x-ray for syncope work-up and placed on cardiac monitor.  For wrist deformity, will check plain films.  While awaiting work-up, signed out to Dr. Lockie Mola.  Final Clinical Impression(s) / ED Diagnoses Final diagnoses:  Injury of right wrist, initial encounter    Rx / DC Orders ED Discharge Orders    None       Milagros Loll, MD 08/30/20 1456

## 2020-09-01 ENCOUNTER — Telehealth: Payer: Self-pay | Admitting: Cardiology

## 2020-09-01 NOTE — Telephone Encounter (Signed)
   [facility] Pre-operative Risk Assessment      HEARTCARE STAFF: - Please ensure there is not already an duplicate clearance open for this procedure. - Under Visit Info/Reason for Call, type in Other and utilize the format Clearance MM/DD/YY or Clearance TBD. Do not use dashes or single digits. - If request is for dental extraction, please clarify the # of teeth to be extracted. - If the patient is currently at the dentist's office, call Pre-Op APP to address. If the patient is not currently in the dentist office, please route to the Pre-Op pool  Request for surgical clearance:  1. What type of surgery is being performed? Open treatment of right distal radius fracture   2. When is this surgery scheduled? 09/05/20  3. What type of clearance is required (medical clearance vs. Pharmacy clearance to hold med vs. Both)? Pharmacy   4. Are there any medications that need to be held prior to surgery and how long? Eliquis TBD by Cardiology     5. Practice name and name of physician performing surgery? [place], Dr. Milly Jakob   6. What is the office phone number? [phone]   7.   What is the office fax number? [phone]  8.   Anesthesia type (None, local, MAC, general) ? MAC with preop block    Trilby Drummer 09/01/2020, 9:47 AM  _________________________________________________________________   (provider comments below)

## 2020-09-01 NOTE — Telephone Encounter (Signed)
Patient with diagnosis of afib on Eliquis for anticoagulation.    Procedure: Open treatment of right distal radius fracture   Date of procedure: 09/05/20  CHA2DS2-VASc Score = 4  This indicates a 4.8% annual risk of stroke. The patient's score is based upon: CHF History: Yes HTN History: Yes Diabetes History: No Stroke History: No Vascular Disease History: No Age Score: 1 Gender Score: 1   CrCl 60mL/min Platelet count 197K  Per office protocol, patient can hold Eliquis for 2-3 days prior to procedure.

## 2020-09-01 NOTE — Telephone Encounter (Signed)
    Leah Mata DOB:  [birth year]  MRN:  [id number]   Primary Cardiologist: Jodelle Red, MD  Chart reviewed as part of pre-operative protocol coverage. Given past medical history and time since last visit, based on ACC/AHA guidelines, Leah Mata would be at acceptable risk for the planned procedure without further cardiovascular testing.   Patient with diagnosis of afib on Eliquis for anticoagulation.    Procedure: Open treatment of right distal radius fracture  Date of procedure: 09/05/20  CHA2DS2-VASc Score = 4  This indicates a 4.8% annual risk of stroke. The patient's score is based upon: CHF History: Yes HTN History: Yes Diabetes History: No Stroke History: No Vascular Disease History: No Age Score: 1 Gender Score: 1   CrCl 29mL/min Platelet count 197K  Per office protocol, patient can hold Eliquis for 2-3 days prior to procedure.    I will route this recommendation to the requesting party via Epic fax function and remove from pre-op pool.  Please call with questions.  Georgie Chard, NP 09/01/2020, 11:28 AM

## 2020-09-04 ENCOUNTER — Other Ambulatory Visit: Payer: Self-pay | Admitting: Internal Medicine

## 2020-09-05 DIAGNOSIS — G8918 Other acute postprocedural pain: Secondary | ICD-10-CM | POA: Diagnosis not present

## 2020-09-05 DIAGNOSIS — S52501A Unspecified fracture of the lower end of right radius, initial encounter for closed fracture: Secondary | ICD-10-CM | POA: Diagnosis not present

## 2020-09-05 DIAGNOSIS — S52571A Other intraarticular fracture of lower end of right radius, initial encounter for closed fracture: Secondary | ICD-10-CM | POA: Diagnosis not present

## 2020-09-18 DIAGNOSIS — Z9889 Other specified postprocedural states: Secondary | ICD-10-CM | POA: Diagnosis not present

## 2020-09-18 DIAGNOSIS — S52501A Unspecified fracture of the lower end of right radius, initial encounter for closed fracture: Secondary | ICD-10-CM | POA: Diagnosis not present

## 2020-09-18 DIAGNOSIS — S52571D Other intraarticular fracture of lower end of right radius, subsequent encounter for closed fracture with routine healing: Secondary | ICD-10-CM | POA: Diagnosis not present

## 2020-09-18 DIAGNOSIS — Z4889 Encounter for other specified surgical aftercare: Secondary | ICD-10-CM | POA: Diagnosis not present

## 2020-09-22 ENCOUNTER — Other Ambulatory Visit: Payer: Self-pay | Admitting: Internal Medicine

## 2020-09-23 DIAGNOSIS — S52571D Other intraarticular fracture of lower end of right radius, subsequent encounter for closed fracture with routine healing: Secondary | ICD-10-CM | POA: Diagnosis not present

## 2020-09-23 DIAGNOSIS — Z4889 Encounter for other specified surgical aftercare: Secondary | ICD-10-CM | POA: Diagnosis not present

## 2020-10-01 ENCOUNTER — Telehealth: Payer: Self-pay | Admitting: Internal Medicine

## 2020-10-01 DIAGNOSIS — S52571D Other intraarticular fracture of lower end of right radius, subsequent encounter for closed fracture with routine healing: Secondary | ICD-10-CM | POA: Diagnosis not present

## 2020-10-01 DIAGNOSIS — E2839 Other primary ovarian failure: Secondary | ICD-10-CM

## 2020-10-01 DIAGNOSIS — Z4889 Encounter for other specified surgical aftercare: Secondary | ICD-10-CM | POA: Diagnosis not present

## 2020-10-01 NOTE — Telephone Encounter (Signed)
Ok to contact pt  Due to her recent fx, we should order a Bone Density testing - this is done today  Pleaes assist pt with scheduling for the DXA to be done at [facility]

## 2020-10-01 NOTE — Telephone Encounter (Signed)
-----   Message from [name], LPN sent at 12/03/2227  1:00 PM EDT ----- Regarding: FW: TIME SENSITVE.Marland KitchenBONE SCAN This patient needs to be scheduled for a bone density scan due to recent fracture.  Does she need to come in for a visit with you?  [name] ----- Message ----- From: Orland Penman, NT Sent: 10/01/2020  12:41 PM EDT To: [name], LPN Subject: TIME SENSITVE.Marland KitchenBONE SCAN                       Good afternoon,     The following patient has had a fracture recently. Due to this fracture she has fell on the gap report for Osteoporosis Management. It is recommended she have a Bone Density scan or be treated with an Osteoporosis Medication.     Patient: Leah Mata, Leah Mata  Date of Birth: 26-Mar-1946 Fracture Date:08/30/2020 Deadline: 02/26/2021    If you have any questions or concerns please feel free to contact me. [email or web address] [phone]

## 2020-10-07 DIAGNOSIS — Z4889 Encounter for other specified surgical aftercare: Secondary | ICD-10-CM | POA: Diagnosis not present

## 2020-10-07 DIAGNOSIS — S52571D Other intraarticular fracture of lower end of right radius, subsequent encounter for closed fracture with routine healing: Secondary | ICD-10-CM | POA: Diagnosis not present

## 2020-10-09 ENCOUNTER — Telehealth: Payer: Self-pay

## 2020-10-09 NOTE — Telephone Encounter (Signed)
I was able to Schedule the pt an appointment for a bone density test to be done at the [facility] office on 10/13/20 @ 10am.  Pt did not answer as I tried reaching her on both numbers we have on file. Unable to lvm. I sent the pt a [facility] message as well informing her this appointment and the number to call if she has to r/s for any reason.

## 2020-10-13 ENCOUNTER — Ambulatory Visit (INDEPENDENT_AMBULATORY_CARE_PROVIDER_SITE_OTHER)
Admission: RE | Admit: 2020-10-13 | Discharge: 2020-10-13 | Disposition: A | Discharge status: Home / Self Care | Payer: Medicare HMO | Source: Ambulatory Visit | Attending: Internal Medicine | Admitting: Internal Medicine

## 2020-10-13 ENCOUNTER — Other Ambulatory Visit: Payer: Self-pay

## 2020-10-13 DIAGNOSIS — E2839 Other primary ovarian failure: Secondary | ICD-10-CM

## 2020-10-14 DIAGNOSIS — S52571D Other intraarticular fracture of lower end of right radius, subsequent encounter for closed fracture with routine healing: Secondary | ICD-10-CM | POA: Diagnosis not present

## 2020-10-14 DIAGNOSIS — Z4889 Encounter for other specified surgical aftercare: Secondary | ICD-10-CM | POA: Diagnosis not present

## 2020-10-15 ENCOUNTER — Encounter: Payer: Self-pay | Admitting: Internal Medicine

## 2020-10-17 ENCOUNTER — Other Ambulatory Visit: Payer: Self-pay

## 2020-10-17 ENCOUNTER — Encounter: Payer: Self-pay | Admitting: Internal Medicine

## 2020-10-17 ENCOUNTER — Ambulatory Visit (INDEPENDENT_AMBULATORY_CARE_PROVIDER_SITE_OTHER): Payer: Medicare HMO | Admitting: Internal Medicine

## 2020-10-17 VITALS — BP 130/64 | HR 64 | Temp 98.2°F | Resp 18 | Ht 66.0 in | Wt 173.0 lb

## 2020-10-17 DIAGNOSIS — K439 Ventral hernia without obstruction or gangrene: Secondary | ICD-10-CM

## 2020-10-17 DIAGNOSIS — E7800 Pure hypercholesterolemia, unspecified: Secondary | ICD-10-CM | POA: Diagnosis not present

## 2020-10-17 DIAGNOSIS — J449 Chronic obstructive pulmonary disease, unspecified: Secondary | ICD-10-CM | POA: Diagnosis not present

## 2020-10-17 DIAGNOSIS — E538 Deficiency of other specified B group vitamins: Secondary | ICD-10-CM | POA: Diagnosis not present

## 2020-10-17 DIAGNOSIS — R7303 Prediabetes: Secondary | ICD-10-CM

## 2020-10-17 DIAGNOSIS — H6123 Impacted cerumen, bilateral: Secondary | ICD-10-CM | POA: Diagnosis not present

## 2020-10-17 DIAGNOSIS — E559 Vitamin D deficiency, unspecified: Secondary | ICD-10-CM | POA: Diagnosis not present

## 2020-10-17 DIAGNOSIS — I10 Essential (primary) hypertension: Secondary | ICD-10-CM

## 2020-10-17 DIAGNOSIS — Z0001 Encounter for general adult medical examination with abnormal findings: Secondary | ICD-10-CM | POA: Diagnosis not present

## 2020-10-17 NOTE — Patient Instructions (Signed)
Your ears were cleared of wax with irrigation today  Please continue all other medications as before, and refills have been done if requested.  Please have the pharmacy call with any other refills you may need.  Please continue your efforts at being more active, low cholesterol diet, and weight control.  You are otherwise up to date with prevention measures today.  Please keep your appointments with your specialists as you may have planned  Please go to the LAB at the blood drawing area for the tests to be done  You will be contacted by phone if any changes need to be made immediately.  Otherwise, you will receive a letter about your results with an explanation, but please check with MyChart first.  Please remember to sign up for MyChart if you have not done so, as this will be important to you in the future with finding out test results, communicating by private email, and scheduling acute appointments online when needed.  Please make an Appointment to return in 6 months, or sooner if needed

## 2020-10-17 NOTE — Assessment & Plan Note (Signed)
Last vitamin D Lab Results  Component Value Date   VD25OH 40.24 09/06/2019   Stable, cont oral replacement

## 2020-10-17 NOTE — Progress Notes (Addendum)
Patient ID: Leah Mata, female   DOB: February 24, 1946, 74 y.o.   MRN: [id number]         Chief Complaint:: wellness exam and Hernia Concerns  , s/p recent right wrist fracture, and bilateral hearing loss       HPI:  Leah Mata is a 75 y.o. female here for wellness exam; declinecovid vax, and shingrix, o/w up to date with preventive referrals and immunizations.                          Also is s/p recent fall with right wrist fracture followed per ortho and now continuing PT with improving function or wrist and hand overall.  Has not smoke tobacco since jan 2000.  Is now s/p abd aneurysm surgury but has a rather large incisional hernia, advised to not pursue surgury per pt per surgury as has no pain oro there significant symptoms.   She cont's to be dissapointed by this as the hernia is large and was hoping I would have a different recommendation today.  Also has worsening 1 wk bilateral hearing los worse on the right such that it seems to pop and massoging the ear can make better sometimes, but no pain, HA, fever, chills, d/c, blood, worsening sinus congestion . Nothing else makes better or worse.     Wt Readings from Last 3 Encounters:  10/17/20 173 lb (78.5 kg)  12/11/19 153 lb 3.2 oz (69.5 kg)  09/06/19 169 lb (76.7 kg)   BP Readings from Last 3 Encounters:  10/17/20 130/64  08/30/20 128/71  12/11/19 140/74   Immunization History  Administered Date(s) Administered   Fluad Quad(high Dose 65+) 01/20/2019   Influenza, High Dose Seasonal PF 03/12/2017, 04/06/2018   Influenza,inj,Quad PF,6+ Mos 12/01/2012, 01/31/2014, 02/24/2016   Influenza-Unspecified 11/28/2011, 01/11/2015   Pneumococcal Conjugate-13 08/15/2014   Pneumococcal Polysaccharide-23 05/30/2012   Td 11/28/2006   Tdap 09/07/2016   Zoster, Live 11/29/2008   There are no preventive care reminders to display for this patient.     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 Her smoking use included cigarettes. She has never used smokeless tobacco. She reports that she does not drink alcohol and does not use drugs. family history includes Congestive Heart Failure in her mother; Coronary artery disease in an other family member; Diabetes in her father and mother; Heart attack in her father; Hypertension in her mother. Allergies  Allergen Reactions   Hydrochlorothiazide Other (See Comments)    unknown   Current Outpatient Medications on File Prior to Visit  Medication Sig Dispense Refill   ALPRAZolam (XANAX) 0.25 MG tablet TAKE 1 TABLET BY MOUTH TWICE A DAY AS NEEDED FOR ANXIETY 60  tablet 2   amLODipine (NORVASC) 10 MG tablet Take 1 tablet (10 mg total) by mouth daily. 90 tablet 1   apixaban (ELIQUIS) 5 MG TABS tablet Take 1 tablet (5 mg total) by mouth 2 (two) times daily. 60 tablet 6   citalopram (CELEXA) 10 MG tablet TAKE 1 TABLET EVERY DAY 90 tablet 1   fluticasone (FLONASE) 50 MCG/ACT nasal spray Place 1 spray into both nostrils as needed for allergies or rhinitis.     glycerin adult 2 g suppository      levalbuterol (XOPENEX) 1.25 MG/0.5ML nebulizer solution Take 1.25 mg by nebulization every 6 (six) hours as needed for wheezing or shortness of breath. 270 each 1   lisinopril (ZESTRIL) 20 MG tablet  TAKE 1 TABLET TWICE DAILY 180 tablet 3   metoprolol tartrate (LOPRESSOR) 50 MG tablet TAKE 1 TABLET TWICE DAILY 180 tablet 1   ondansetron (ZOFRAN) 4 MG tablet Take 1 tablet (4 mg total) by mouth every 8 (eight) hours as needed for up to 20 doses for nausea or vomiting. 20 tablet 0   pravastatin (PRAVACHOL) 20 MG tablet TAKE 1 TABLET EVERY DAY 90 tablet 1   promethazine-codeine (PHENERGAN WITH CODEINE) 6.25-10 MG/5ML syrup Take 5 mLs by mouth every 6 (six) hours as needed for cough. 180 mL 0   triamcinolone (KENALOG) 0.1 % APPLY TOPICALLY TWICE DAILY 30 g 0   triamcinolone (KENALOG) 0.1 % Apply 1 application topically 2 (two) times daily. 30 g 0   Vitamin D, Ergocalciferol, (DRISDOL) 1.25 MG (50000 UT) CAPS capsule Take 1 capsule (50,000 Units total) by mouth every 7 (seven) days. 12 capsule 0   No current facility-administered medications on file prior to visit.        ROS:  All others reviewed and negative.  Objective        PE:  BP 130/64   Pulse 64   Temp 98.2 F (36.8 C) (Oral)   Resp 18   Ht 5\' 6"  (1.676 m)   Wt 173 lb (78.5 kg)   LMP 03/29/1993 (Approximate)   SpO2 96%   BMI 27.92 kg/m                 Constitutional: Pt appears in NAD               HENT: Head: NCAT.                Right Ear: External ear normal.                 Left Ear: External ear normal. Bilateral hearing improved with wax irrigation               Eyes: . Pupils are equal, round, and reactive to light. Conjunctivae and EOM are normal               Nose: without d/c or deformity               Neck: Neck supple. Gross normal ROM               Cardiovascular: Normal rate and regular rhythm.                 Pulmonary/Chest: Effort normal and breath sounds without rales or wheezing.                Abd:  Soft, NT, ND, + BS, no organomegaly with large incisional hernia reducible nontender  Neurological: Pt is alert. At baseline orientation, motor grossly intact               Skin: Skin is warm.  No rashes, no other new lesions, LE edema - none               Psychiatric: Pt behavior is normal without agitation   Micro: none  Cardiac tracings I have personally interpreted today:  none  Pertinent Radiological findings (summarize): none   Lab Results  Component Value Date   WBC 12.6 (H) 08/30/2020   HGB 13.7 08/30/2020   HCT 41.6 08/30/2020   PLT 197 08/30/2020   GLUCOSE 117 (H) 08/30/2020   CHOL 132 09/06/2019   TRIG 189.0 (H) 09/06/2019   HDL 40.50 09/06/2019   LDLDIRECT 84.1 01/31/2014   LDLCALC 54 09/06/2019   ALT 10 09/06/2019   AST 13 09/06/2019   NA 135 08/30/2020   K 4.6 08/30/2020   CL 100 08/30/2020   CREATININE 1.05 (H) 08/30/2020   BUN 16 08/30/2020   CO2 25 08/30/2020   TSH 0.94 09/06/2019   INR 1.16 04/30/2018   HGBA1C 6.0 09/06/2019   Assessment/Plan:  Leah Mata is a 75 y.o. White or Caucasian [1] female with  has a past medical history of BACK PAIN (03/14/2009), COPD (11/01/2006), GLUCOSE INTOLERANCE (03/14/2009), Headache(784.0) (03/14/2009), HYPERTENSION (11/01/2006), Hypertrophy of tongue papillae (06/05/2009), INSOMNIA, HX OF (01/06/2007), Other and unspecified hyperlipidemia (08/22/2013), and Urinary frequency (03/14/2009).  Vitamin D deficiency Last vitamin D Lab Results  Component Value Date   VD25OH 40.24 09/06/2019   Stable, cont oral replacement   Encounter for well adult exam with abnormal findings Age and sex appropriate education and counseling updated with regular exercise and diet Referrals for preventative services - none needed Immunizations addressed - decilenes covid vax and shingrix  Smoking counseling  - none needed Evidence for depression or other mood disorder - none significant Most recent labs reviewed. I have personally reviewed and have noted: 1) the patient's medical and social history 2) The patient's current medications and supplements 3) The patient's height, weight, and BMI have been recorded in the  chart   Bilateral hearing loss due to cerumen impaction Now improved with wax irrigaiton.  Patient agreed to ear lavage due to bilateral cerumen impaction; Ear lavage completed with no difficulty, impaction resolved, TM's look normal; she will continue using OTC kits regularly for prevention  Essential hypertension BP Readings from Last 3 Encounters:  10/17/20 130/64  08/30/20 128/71  12/11/19 140/74   Stable, pt to continue medical treatment amldoipine, lsinopril, lopressor   Pre-diabetes Lab Results  Component Value Date   HGBA1C 6.0 09/06/2019   Stable, pt to continue current medical treatment  - diet   Hyperlipidemia Lab Results  Component Value Date   LDLCALC 54 09/06/2019   Stable, pt to continue current statin pravachol   Abdominal wall hernia Stable, cont to monitor, no indication for surgury  Followup: Return in about 6 months (around 04/19/2021).  Oliver Barre, MD 10/18/2020 2:14 PM [facility] Internal Medicine

## 2020-10-18 ENCOUNTER — Encounter: Payer: Self-pay | Admitting: Internal Medicine

## 2020-10-18 NOTE — Assessment & Plan Note (Signed)
Age and sex appropriate education and counseling updated with regular exercise and diet Referrals for preventative services - none needed Immunizations addressed - decilenes covid vax and shingrix  Smoking counseling  - none needed Evidence for depression or other mood disorder - none significant Most recent labs reviewed. I have personally reviewed and have noted: 1) the patient's medical and social history 2) The patient's current medications and supplements 3) The patient's height, weight, and BMI have been recorded in the chart

## 2020-10-18 NOTE — Assessment & Plan Note (Signed)
Now improved with wax irrigaiton.  Patient agreed to ear lavage due to bilateral cerumen impaction; Ear lavage completed with no difficulty, impaction resolved, TM's look normal; she will continue using OTC kits regularly for prevention

## 2020-10-18 NOTE — Assessment & Plan Note (Signed)
Stable, cont to monitor, no indication for surgury

## 2020-10-18 NOTE — Assessment & Plan Note (Signed)
Lab Results  Component Value Date   LDLCALC 54 09/06/2019   Stable, pt to continue current statin pravachol

## 2020-10-18 NOTE — Assessment & Plan Note (Signed)
BP Readings from Last 3 Encounters:  10/17/20 130/64  08/30/20 128/71  12/11/19 140/74   Stable, pt to continue medical treatment amldoipine, lsinopril, lopressor

## 2020-10-18 NOTE — Assessment & Plan Note (Signed)
Lab Results  Component Value Date   HGBA1C 6.0 09/06/2019   Stable, pt to continue current medical treatment  - diet

## 2020-10-20 NOTE — Addendum Note (Signed)
Addended by: Zenovia Jordan A on: 10/20/2020 09:07 AM   Modules accepted: Orders

## 2020-10-28 ENCOUNTER — Other Ambulatory Visit: Payer: Self-pay | Admitting: Internal Medicine

## 2020-10-28 ENCOUNTER — Telehealth: Payer: Self-pay | Admitting: Internal Medicine

## 2020-10-28 MED ORDER — ALPRAZOLAM 0.25 MG PO TABS: ORAL_TABLET | ORAL | 2 refills | Status: DC

## 2020-10-29 ENCOUNTER — Other Ambulatory Visit: Payer: Self-pay | Admitting: Internal Medicine

## 2020-10-29 MED ORDER — AMLODIPINE BESYLATE 10 MG PO TABS: 10.0000 mg | ORAL_TABLET | Freq: Every day | ORAL | 1 refills | Status: DC

## 2020-10-29 NOTE — Telephone Encounter (Signed)
Please refill as per office routine med refill policy (all routine meds refilled for 3 mo or monthly per pt preference up to one year from last visit, then month to month grace period for 3 mo, then further med refills will have to be denied)  

## 2020-10-30 ENCOUNTER — Other Ambulatory Visit: Payer: Self-pay

## 2020-11-04 DIAGNOSIS — Z4889 Encounter for other specified surgical aftercare: Secondary | ICD-10-CM | POA: Diagnosis not present

## 2020-11-04 DIAGNOSIS — S52571D Other intraarticular fracture of lower end of right radius, subsequent encounter for closed fracture with routine healing: Secondary | ICD-10-CM | POA: Diagnosis not present

## 2020-11-11 DIAGNOSIS — S52551D Other extraarticular fracture of lower end of right radius, subsequent encounter for closed fracture with routine healing: Secondary | ICD-10-CM | POA: Diagnosis not present

## 2020-12-10 ENCOUNTER — Ambulatory Visit (HOSPITAL_BASED_OUTPATIENT_CLINIC_OR_DEPARTMENT_OTHER): Payer: Medicare HMO | Admitting: Cardiology

## 2020-12-10 ENCOUNTER — Encounter (HOSPITAL_BASED_OUTPATIENT_CLINIC_OR_DEPARTMENT_OTHER): Payer: Self-pay | Admitting: Cardiology

## 2020-12-10 ENCOUNTER — Other Ambulatory Visit: Payer: Self-pay

## 2020-12-10 VITALS — BP 128/60 | HR 60 | Ht 66.0 in | Wt 163.0 lb

## 2020-12-10 DIAGNOSIS — I10 Essential (primary) hypertension: Secondary | ICD-10-CM | POA: Diagnosis not present

## 2020-12-10 DIAGNOSIS — Z9889 Other specified postprocedural states: Secondary | ICD-10-CM

## 2020-12-10 DIAGNOSIS — I480 Paroxysmal atrial fibrillation: Secondary | ICD-10-CM

## 2020-12-10 DIAGNOSIS — E7800 Pure hypercholesterolemia, unspecified: Secondary | ICD-10-CM | POA: Diagnosis not present

## 2020-12-10 DIAGNOSIS — I5032 Chronic diastolic (congestive) heart failure: Secondary | ICD-10-CM | POA: Diagnosis not present

## 2020-12-10 NOTE — Patient Instructions (Signed)
Medication Instructions:  Your physician recommends that you continue on your current medications as directed. Please refer to the Current Medication list given to you today.  *If you need a refill on your cardiac medications before your next appointment, please call your pharmacy*  Follow-Up: At [facility], you and your health needs are our priority.  As part of our continuing mission to provide you with exceptional heart care, we have created designated Provider Care Teams.  These Care Teams include your primary Cardiologist (physician) and Advanced Practice Providers (APPs -  Physician Assistants and Nurse Practitioners) who all work together to provide you with the care you need, when you need it.  We recommend signing up for the patient portal called "MyChart".  Sign up information is provided on this After Visit Summary.  MyChart is used to connect with patients for Virtual Visits (Telemedicine).  Patients are able to view lab/test results, encounter notes, upcoming appointments, etc.  Non-urgent messages can be sent to your provider as well.   To learn more about what you can do with MyChart, go to [email or web address].    Your next appointment:   12 month(s)  The format for your next appointment:   In Person  Provider:   Bridgette Christopher, MD    

## 2020-12-10 NOTE — Progress Notes (Signed)
Cardiology Office Note:    Date:  12/10/2020   ID:  Leah Mata, DOB 01-19-1946, MRN [id number]  PCP:  Corwin Levins, MD  Cardiologist:  Jodelle Red, MD  Referring MD: Corwin Levins, MD   CC: follow up.   History of Present Illness:    Leah Mata is a 75 y.o. female with a hx of paroxysmal atrial fib/flutter, hypertension, hyperlipidemia, AAA with ruptured abdominal aortic aneurysm with surgery on 04/29/2018. She had a >30 day admission, with discharge on 05/30/18.  Per recommendations, should have CT scan 5 years post op (2025).   Today: In June, was sitting at the table, got up to answer the phone and fell, breaking her arm. Remembers falling, no confusion after, but unsure if she lost consciousness. Per ER note 08/30/20, noted as near syncope. Had family present, got up immediately. Has had historically some lightheadedness with changing position.   Blood pressures have been well controlled at home. Tolerating medications well.   Not smoking at all, congratulated.   Denies chest pain, shortness of breath at rest or with normal exertion. No PND, orthopnea, LE edema or unexpected weight gain. No syncope or palpitations.   ROS positive for abdominal hernia, belching, flatus, chronic constipation. No melena or hematochezia.  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   INSOMNIA, HX OF 01/06/2007   Other and unspecified hyperlipidemia 08/22/2013   Urinary frequency 03/14/2009    Past Surgical History:  Procedure Laterality Date   ABDOMINAL AORTIC ANEURYSM REPAIR N/A 04/28/2018   Procedure: ANEURYSM ABDOMINAL AORTIC REPAIR WITH HEMASHIELD GOLD VASCULAR GRAFT 22X11 mm;  Surgeon: Larina Earthly, MD;  Location: [facility] OR;  Service: Vascular;  Laterality: N/A;   CARDIAC CATHETERIZATION  2003   negative   DILATION  AND CURETTAGE OF UTERUS     TONSILLECTOMY     TUBAL LIGATION      Current Medications: Current Outpatient Medications on File Prior to Visit  Medication Sig   ALPRAZolam (XANAX) 0.25 MG tablet TAKE 1 TABLET BY MOUTH TWICE A DAY AS NEEDED FOR ANXIETY   amLODipine (NORVASC) 10 MG tablet Take 1 tablet (10 mg total) by mouth daily.   apixaban (ELIQUIS) 5 MG TABS tablet Take 1 tablet (5 mg total) by mouth 2 (two) times daily.   citalopram (CELEXA) 10 MG tablet TAKE 1 TABLET EVERY DAY   fluticasone (FLONASE) 50 MCG/ACT nasal spray Place 1 spray into both nostrils as needed for allergies or rhinitis.   glycerin adult 2 g suppository    levalbuterol (XOPENEX) 1.25 MG/0.5ML nebulizer solution Take 1.25 mg by nebulization every 6 (six) hours as needed for wheezing or shortness of breath.   lisinopril (ZESTRIL) 20 MG tablet TAKE 1 TABLET TWICE DAILY   metoprolol tartrate (LOPRESSOR) 50 MG tablet TAKE 1 TABLET TWICE DAILY   ondansetron (ZOFRAN) 4 MG tablet Take 1 tablet (4 mg total) by mouth every 8 (eight) hours as needed for up to 20 doses for nausea or vomiting.   pravastatin (PRAVACHOL) 20 MG tablet TAKE 1 TABLET EVERY DAY   promethazine-codeine (PHENERGAN WITH CODEINE) 6.25-10 MG/5ML syrup Take 5 mLs by mouth every 6 (six) hours as needed for cough.   triamcinolone (KENALOG) 0.1 % APPLY TOPICALLY TWICE DAILY   triamcinolone (KENALOG) 0.1 % Apply 1 application topically 2 (two) times daily.  Vitamin D, Ergocalciferol, (DRISDOL) 1.25 MG (50000 UT) CAPS capsule Take 1 capsule (50,000 Units total) by mouth every 7 (seven) days.   No current facility-administered medications on file prior to visit.     Allergies:   Hydrochlorothiazide   Social History   Tobacco Use   Smoking status: Former    Types: Cigarettes   Smokeless tobacco: Never   Tobacco comments:    2 packs per week  Vaping Use   Vaping Use: Never used  Substance Use Topics   Alcohol use: No   Drug use: No    Family  History: family history includes Congestive Heart Failure in her mother; Coronary artery disease in an other family member; Diabetes in her father and mother; Heart attack in her father; Hypertension in her mother. There is no history of Colon cancer or Breast cancer.  ROS:   Please see the history of present illness.  Additional pertinent ROS otherwise unremarkable.    EKGs/Labs/Other Studies Reviewed:    The following studies were reviewed today: Echo 04/2018  1. The left ventricle has hyperdynamic systolic function of >65%. The  cavity size is normal. There is no increased left ventricular wall  thickness. Echo evidence of impaired diastolic relaxation.   2. The right ventricle has normal systolic function. The cavity in normal  in size. There is no increase in right ventricular wall thickness.   3. The mitral valve is normal in structure.   4. The aortic valve has an indeterminant number of cusps.   5. The pulmonic valve is Not well seen. Pulmonic valve regurgitation was  not assessed by color flow Doppler.   6. There is mild dilatation of the aortic root.   7. The interatrial septum was not assessed.   8. Extremely limited due to poor sound wave transmission; definity used;  vigorous LV systolic function; mild diastolic dysfunction.   EKG:  EKG is personally reviewed.   08/30/20 sinus bradycardia at 59 bpm  Recent Labs: 08/30/2020: BUN 16; Creatinine, Ser 1.05; Hemoglobin 13.7; Platelets 197; Potassium 4.6; Sodium 135  Recent Lipid Panel    Component Value Date/Time   CHOL 132 09/06/2019 1138   TRIG 189.0 (H) 09/06/2019 1138   HDL 40.50 09/06/2019 1138   CHOLHDL 3 09/06/2019 1138   VLDL 37.8 09/06/2019 1138   LDLCALC 54 09/06/2019 1138   LDLDIRECT 84.1 01/31/2014 1143    Physical Exam:    VS:  BP 128/60 (BP Location: Right Arm, Patient Position: Sitting, Cuff Size: Normal)   Pulse 60   Ht 5\' 6"  (1.676 m)   Wt 163 lb (73.9 kg)   LMP 03/29/1993 (Approximate)   BMI 26.31  kg/m     Wt Readings from Last 3 Encounters:  12/10/20 163 lb (73.9 kg)  10/17/20 173 lb (78.5 kg)  12/11/19 153 lb 3.2 oz (69.5 kg)    GEN: Well nourished, well developed in no acute distress HEENT: Normal, moist mucous membranes NECK: No JVD CARDIAC: regular rhythm, normal S1 and S2, no rubs or gallops. No murmur. VASCULAR: Radial and DP pulses 2+ bilaterally. No carotid bruits RESPIRATORY:  Clear to auscultation without rales, wheezing or rhonchi  ABDOMEN: Soft, non-tender, non-distended. Large ventral hernia.  MUSCULOSKELETAL:  Ambulates independently SKIN: Warm and dry, no edema NEUROLOGIC:  Alert and oriented x 3. No focal neuro deficits noted. PSYCHIATRIC:  Normal affect    ASSESSMENT:    1. Paroxysmal atrial fibrillation (HCC)   2. Essential hypertension   3. Pure hypercholesterolemia  4. Chronic diastolic heart failure (HCC)   5. History of AAA (abdominal aortic aneurysm) repair     PLAN:    Hypertension: at goal of <130/80 -continue amlodipine 10 mg daily -continue lisinopril 20 mg BID (patient preference for BID, ok by me, want 40 mg total daily dose) -currently on metoprolol 50 mg BID. Next change will be to change this to carvedilol if more BP control needed -discussed red flag symptoms that need immediate medical attention   Paroxysmal atrial fib and flutter: CHA2DS2/VAS Stroke Risk Points=4  -in NSR on most recent ECG, denies palpitations or tachycardia -continue apixaban 5 mg BID -on metoprolol for rate control  History of AAA rupture, with open repair of 8.3 cm infrarenal AAA:  -BP, lipid control as noted -counseled on importance of complete tobacco avoidance   Chronic diastolic dysfunction: -euvolemic  Hypercholesterolemia: with PAD, LDL goal <70.  -last LDL 54 09/06/2019 -continue pravastatin 20 mg, though ideally given her ASCVD would like a higher intensity statin. However, she tolerates this dose, so this is a reasonable compromise.  Cardiac  risk counseling and prevention recommendations: -recommend heart healthy/Mediterranean diet, with whole grains, fruits, vegetable, fish, lean meats, nuts, and olive oil. Limit salt. -recommend moderate walking, 3-5 times/week for 30-50 minutes each session. Aim for at least 150 minutes.week. Goal should be pace of 3 miles/hours, or walking 1.5 miles in 30 minutes -recommend avoidance of tobacco products. Avoid excess alcohol.  Prior Covid-19 infection: feels near baseline  Plan for follow up: 1 year, check lipids prior if not done sooner  Jodelle Red, MD, PhD [place]  [facility]   Medication Adjustments/Labs and Tests Ordered: Current medicines are reviewed at length with the patient today.  Concerns regarding medicines are outlined above.  No orders of the defined types were placed in this encounter.  No orders of the defined types were placed in this encounter.   Patient Instructions  Medication Instructions:  Your physician recommends that you continue on your current medications as directed. Please refer to the Current Medication list given to you today.  *If you need a refill on your cardiac medications before your next appointment, please call your pharmacy*  Follow-Up: At [facility], you and your health needs are our priority.  As part of our continuing mission to provide you with exceptional heart care, we have created designated Provider Care Teams.  These Care Teams include your primary Cardiologist (physician) and Advanced Practice Providers (APPs -  Physician Assistants and Nurse Practitioners) who all work together to provide you with the care you need, when you need it.  We recommend signing up for the patient portal called "MyChart".  Sign up information is provided on this After Visit Summary.  MyChart is used to connect with patients for Virtual Visits (Telemedicine).  Patients are able to view lab/test results, encounter notes, upcoming  appointments, etc.  Non-urgent messages can be sent to your provider as well.   To learn more about what you can do with MyChart, go to [email or web address].    Your next appointment:   12 month(s)  The format for your next appointment:   In Person  Provider:   Jodelle Red, MD    Signed, Jodelle Red, MD PhD 12/10/2020  [facility]

## 2020-12-11 DIAGNOSIS — S52551D Other extraarticular fracture of lower end of right radius, subsequent encounter for closed fracture with routine healing: Secondary | ICD-10-CM | POA: Diagnosis not present

## 2020-12-11 DIAGNOSIS — S52571D Other intraarticular fracture of lower end of right radius, subsequent encounter for closed fracture with routine healing: Secondary | ICD-10-CM | POA: Diagnosis not present

## 2020-12-23 ENCOUNTER — Other Ambulatory Visit: Payer: Self-pay | Admitting: Internal Medicine

## 2020-12-23 DIAGNOSIS — Z1231 Encounter for screening mammogram for malignant neoplasm of breast: Secondary | ICD-10-CM

## 2020-12-24 IMAGING — MG DIGITAL SCREENING BILAT W/ TOMO W/ CAD
6 of 10 series · 6 of 30 positions shown · non-contrast
Comparison: Previous exam(s).

CLINICAL DATA: Screening.

EXAM:
DIGITAL SCREENING BILATERAL MAMMOGRAM WITH TOMO AND CAD

[L CC synth-2D]
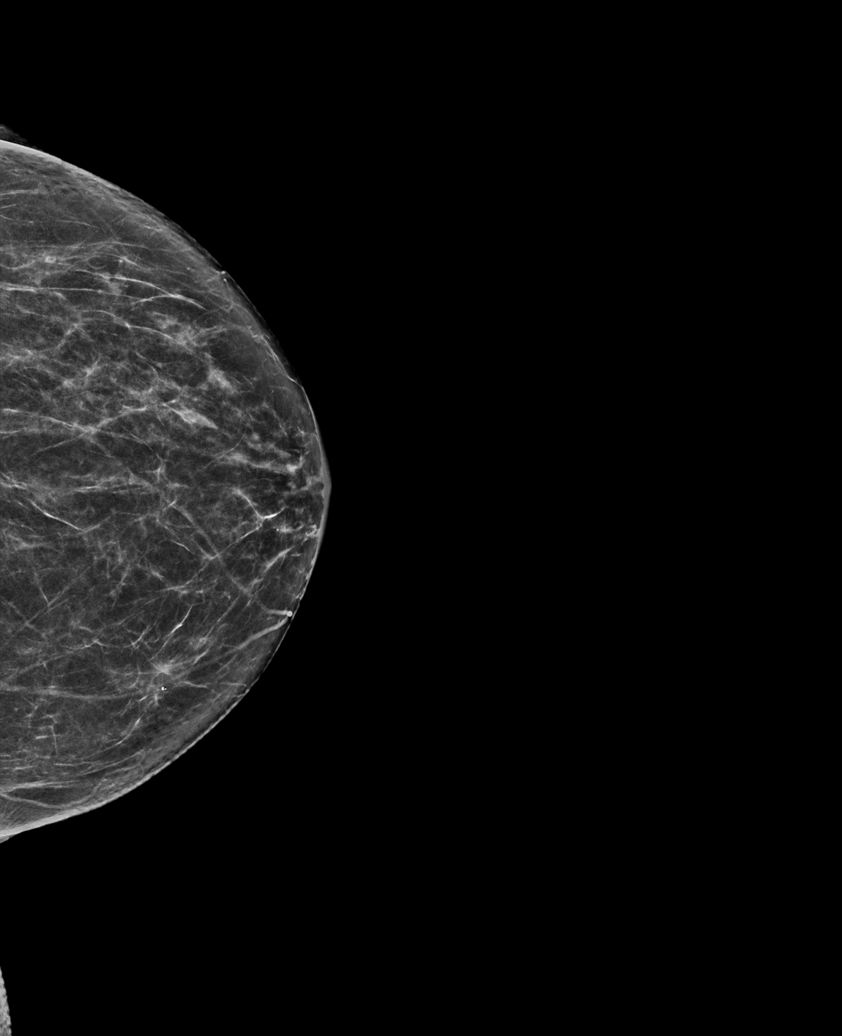

[R MLO synth-2D (1 of 2)]
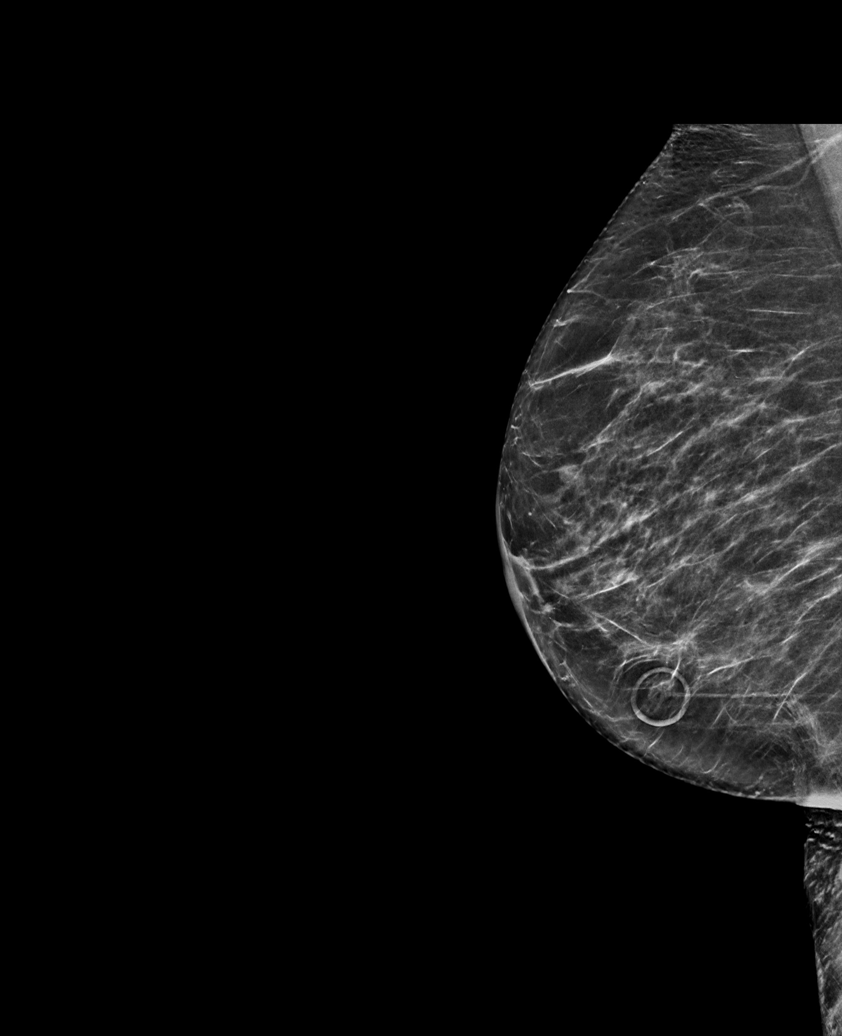

[L MLO synth-2D]
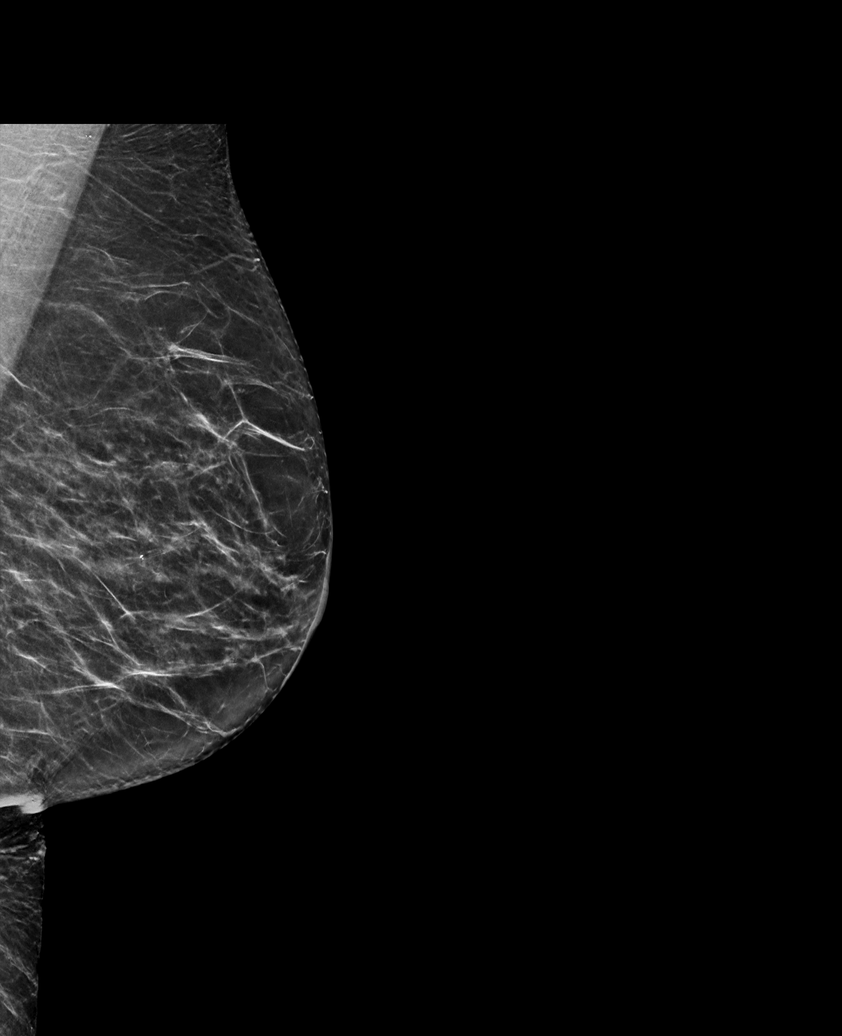

[R MLO synth-2D (2 of 2)]
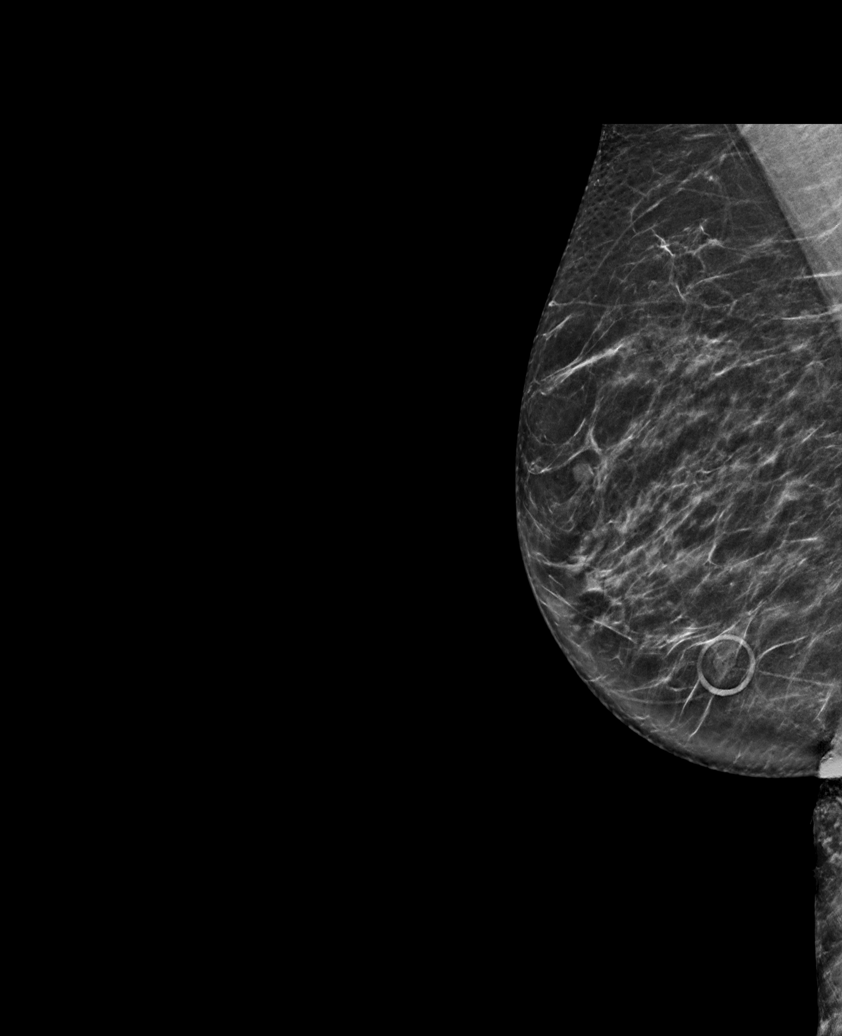

[R CC synth-2D]
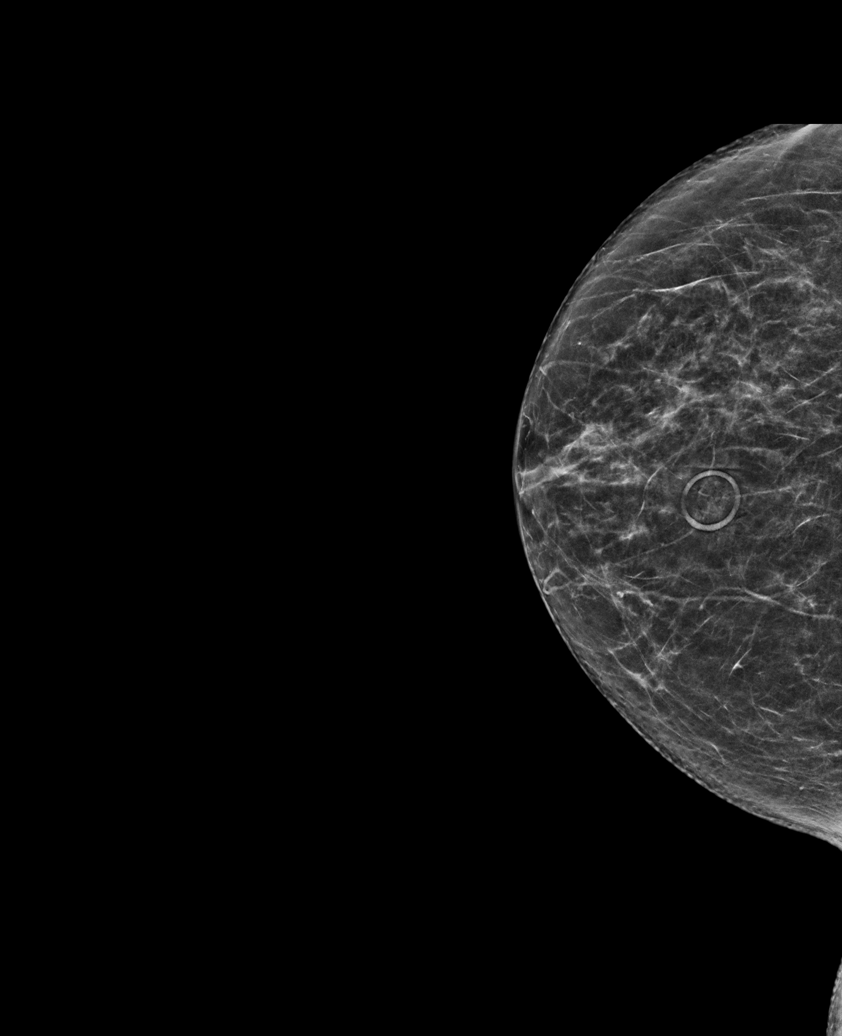

[L CC tomo · tomo slice 33/64.0]
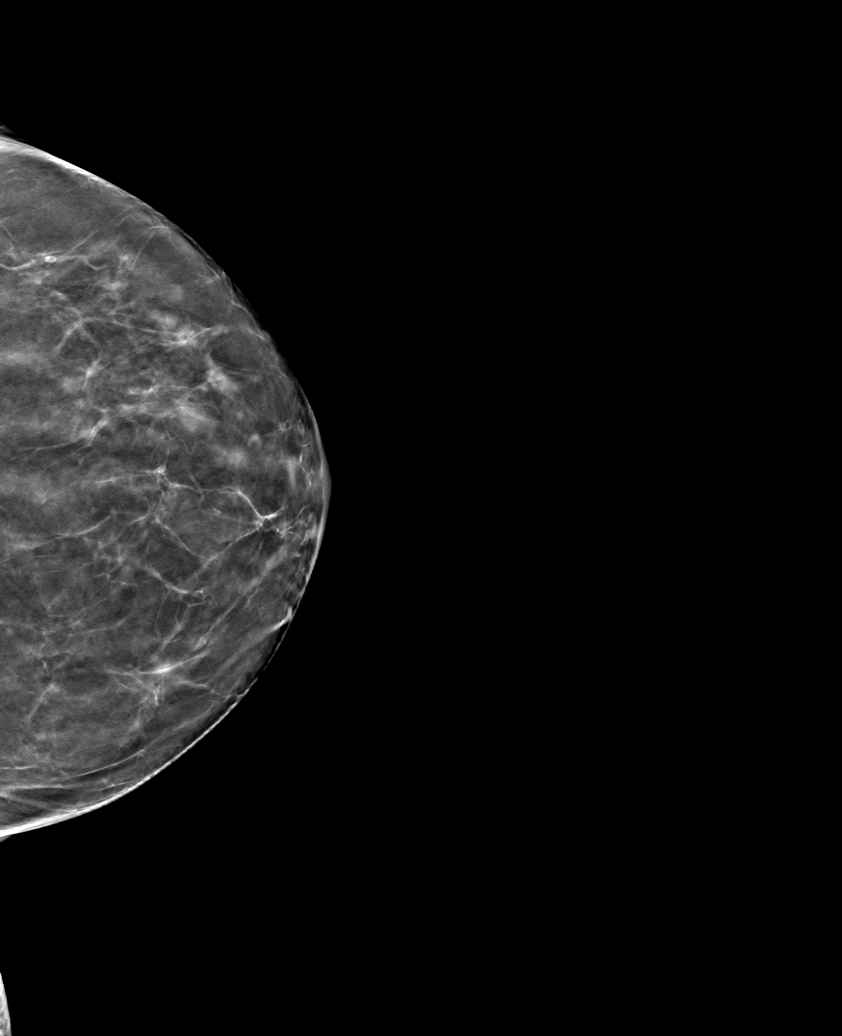

[6 of 30 positions shown; findings below may reference images not displayed]

ACR Breast Density Category b: There are scattered areas of
fibroglandular density.
FINDINGS: There are no findings suspicious for malignancy. Images were
processed with CAD.
IMPRESSION: No mammographic evidence of malignancy. A result letter of this
screening mammogram will be mailed directly to the patient.

RECOMMENDATION:
Screening mammogram in one year. (Code:[id number])

BI-RADS CATEGORY  1: Negative.

## 2021-01-08 ENCOUNTER — Other Ambulatory Visit: Payer: Self-pay | Admitting: Internal Medicine

## 2021-01-21 ENCOUNTER — Ambulatory Visit
Admission: RE | Admit: 2021-01-21 | Discharge: 2021-01-21 | Disposition: A | Discharge status: Home / Self Care | Payer: Medicare HMO | Source: Ambulatory Visit | Attending: Internal Medicine | Admitting: Internal Medicine

## 2021-01-21 ENCOUNTER — Other Ambulatory Visit: Payer: Self-pay

## 2021-01-21 DIAGNOSIS — Z1231 Encounter for screening mammogram for malignant neoplasm of breast: Secondary | ICD-10-CM

## 2021-02-21 ENCOUNTER — Other Ambulatory Visit: Payer: Self-pay | Admitting: Internal Medicine

## 2021-02-27 DIAGNOSIS — H52223 Regular astigmatism, bilateral: Secondary | ICD-10-CM | POA: Diagnosis not present

## 2021-03-27 ENCOUNTER — Other Ambulatory Visit: Payer: Self-pay | Admitting: Internal Medicine

## 2021-03-27 ENCOUNTER — Other Ambulatory Visit: Payer: Self-pay | Admitting: Cardiology

## 2021-03-27 DIAGNOSIS — I10 Essential (primary) hypertension: Secondary | ICD-10-CM

## 2021-03-27 NOTE — Telephone Encounter (Signed)
Please refill as per office routine med refill policy (all routine meds to be refilled for 3 mo or monthly (per pt preference) up to one year from last visit, then month to month grace period for 3 mo, then further med refills will have to be denied) ? ?

## 2021-04-20 ENCOUNTER — Ambulatory Visit (INDEPENDENT_AMBULATORY_CARE_PROVIDER_SITE_OTHER): Payer: Medicare HMO | Admitting: Internal Medicine

## 2021-04-20 ENCOUNTER — Other Ambulatory Visit: Payer: Self-pay

## 2021-04-20 ENCOUNTER — Encounter: Payer: Self-pay | Admitting: Internal Medicine

## 2021-04-20 VITALS — BP 120/60 | HR 48 | Temp 98.8°F | Ht 66.0 in | Wt 179.0 lb

## 2021-04-20 DIAGNOSIS — E559 Vitamin D deficiency, unspecified: Secondary | ICD-10-CM | POA: Diagnosis not present

## 2021-04-20 DIAGNOSIS — E538 Deficiency of other specified B group vitamins: Secondary | ICD-10-CM

## 2021-04-20 DIAGNOSIS — E7800 Pure hypercholesterolemia, unspecified: Secondary | ICD-10-CM | POA: Diagnosis not present

## 2021-04-20 DIAGNOSIS — Z23 Encounter for immunization: Secondary | ICD-10-CM

## 2021-04-20 DIAGNOSIS — Z0001 Encounter for general adult medical examination with abnormal findings: Secondary | ICD-10-CM

## 2021-04-20 DIAGNOSIS — R7303 Prediabetes: Secondary | ICD-10-CM | POA: Diagnosis not present

## 2021-04-20 DIAGNOSIS — I10 Essential (primary) hypertension: Secondary | ICD-10-CM | POA: Diagnosis not present

## 2021-04-20 DIAGNOSIS — R001 Bradycardia, unspecified: Secondary | ICD-10-CM | POA: Diagnosis not present

## 2021-04-20 LAB — URINALYSIS, ROUTINE W REFLEX MICROSCOPIC
Bilirubin Urine: NEGATIVE
Ketones, ur: NEGATIVE
Leukocytes,Ua: NEGATIVE
Nitrite: NEGATIVE
Specific Gravity, Urine: 1.02 (ref 1.000–1.030)
Total Protein, Urine: NEGATIVE
Urine Glucose: NEGATIVE
Urobilinogen, UA: 1 (ref 0.0–1.0)
pH: 6 (ref 5.0–8.0)

## 2021-04-20 LAB — CBC WITH DIFFERENTIAL/PLATELET
Basophils Absolute: 0.1 10*3/uL (ref 0.0–0.1)
Basophils Relative: 0.7 % (ref 0.0–3.0)
Eosinophils Absolute: 0.2 10*3/uL (ref 0.0–0.7)
Eosinophils Relative: 2.4 % (ref 0.0–5.0)
HCT: 41.7 % (ref 36.0–46.0)
Hemoglobin: 13.5 g/dL (ref 12.0–15.0)
Lymphocytes Relative: 26.7 % (ref 12.0–46.0)
Lymphs Abs: 2.5 10*3/uL (ref 0.7–4.0)
MCHC: 32.4 g/dL (ref 30.0–36.0)
MCV: 95 fl (ref 78.0–100.0)
Monocytes Absolute: 1 10*3/uL (ref 0.1–1.0)
Monocytes Relative: 10.9 % (ref 3.0–12.0)
Neutro Abs: 5.6 10*3/uL (ref 1.4–7.7)
Neutrophils Relative %: 59.3 % (ref 43.0–77.0)
Platelets: 197 10*3/uL (ref 150.0–400.0)
RBC: 4.39 Mil/uL (ref 3.87–5.11)
RDW: 13.5 % (ref 11.5–15.5)
WBC: 9.4 10*3/uL (ref 4.0–10.5)

## 2021-04-20 LAB — LIPID PANEL
Cholesterol: 151 mg/dL (ref 0–200)
HDL: 47 mg/dL (ref >39.00)
NonHDL: 104.36
Total CHOL/HDL Ratio: 3
Triglycerides: 224 mg/dL — ABNORMAL HIGH (ref 0.0–149.0)
VLDL: 44.8 mg/dL — ABNORMAL HIGH (ref 0.0–40.0)

## 2021-04-20 LAB — BASIC METABOLIC PANEL
BUN: 15 mg/dL (ref 6–23)
CO2: 28 mEq/L (ref 19–32)
Calcium: 10.2 mg/dL (ref 8.4–10.5)
Chloride: 101 mEq/L (ref 96–112)
Creatinine, Ser: 0.93 mg/dL (ref 0.40–1.20)
GFR: 60.74 mL/min (ref >60.00)
Glucose, Bld: 90 mg/dL (ref 70–99)
Potassium: 4.4 mEq/L (ref 3.5–5.1)
Sodium: 137 mEq/L (ref 135–145)

## 2021-04-20 LAB — HEPATIC FUNCTION PANEL
ALT: 13 U/L (ref 0–35)
AST: 19 U/L (ref 0–37)
Albumin: 4.1 g/dL (ref 3.5–5.2)
Alkaline Phosphatase: 71 U/L (ref 39–117)
Bilirubin, Direct: 0.1 mg/dL (ref 0.0–0.3)
Total Bilirubin: 0.4 mg/dL (ref 0.2–1.2)
Total Protein: 7.3 g/dL (ref 6.0–8.3)

## 2021-04-20 LAB — LDL CHOLESTEROL, DIRECT: Direct LDL: 79 mg/dL

## 2021-04-20 LAB — TSH: TSH: 0.88 u[IU]/mL (ref 0.35–5.50)

## 2021-04-20 LAB — HEMOGLOBIN A1C: Hgb A1c MFr Bld: 6.2 % (ref 4.6–6.5)

## 2021-04-20 LAB — VITAMIN B12: Vitamin B-12: 296 pg/mL (ref 211–911)

## 2021-04-20 LAB — VITAMIN D 25 HYDROXY (VIT D DEFICIENCY, FRACTURES): VITD: 32.45 ng/mL (ref 30.00–100.00)

## 2021-04-20 NOTE — Assessment & Plan Note (Signed)
Mild to high 40's incidentally noted today, asympt, pt declines change of tx for now,  to f/u any worsening symptoms or concerns

## 2021-04-20 NOTE — Assessment & Plan Note (Signed)
BP Readings from Last 3 Encounters:  04/20/21 120/60  12/10/20 128/60  10/17/20 130/64   Stable, pt to continue medical treatment norvasc, lisinopril, lopressor

## 2021-04-20 NOTE — Patient Instructions (Signed)
You had the flu shot today  Please continue all other medications as before, and refills have been done if requested.  Please have the pharmacy call with any other refills you may need.  Please continue your efforts at being more active, low cholesterol diet, and weight control.  You are otherwise up to date with prevention measures today.  Please keep your appointments with your specialists as you may have planned  .Please go to the LAB at the blood drawing area for the tests to be done  You will be contacted by phone if any changes need to be made immediately.  Otherwise, you will receive a letter about your results with an explanation, but please check with MyChart first.  Please remember to sign up for MyChart if you have not done so, as this will be important to you in the future with finding out test results, communicating by private email, and scheduling acute appointments online when needed.  Please make an Appointment to return in 6 months, or sooner if needed  

## 2021-04-20 NOTE — Assessment & Plan Note (Signed)
Last vitamin D Lab Results  Component Value Date   VD25OH 40.24 09/06/2019   Stable, cont oral replacement  

## 2021-04-20 NOTE — Assessment & Plan Note (Signed)
Age and sex appropriate education and counseling updated with regular exercise and diet Referrals for preventative services - none needed Immunizations addressed - for flu shot today, declines covid booster and shingrix Smoking counseling  - none needed Evidence for depression or other mood disorder - none significant Most recent labs reviewed. I have personally reviewed and have noted: 1) the patient's medical and social history 2) The patient's current medications and supplements 3) The patient's height, weight, and BMI have been recorded in the chart

## 2021-04-20 NOTE — Assessment & Plan Note (Signed)
Lab Results  Component Value Date   LDLCALC 54 09/06/2019   Stable, pt to continue current statin pravachol 20

## 2021-04-20 NOTE — Progress Notes (Signed)
Patient ID: Leah Mata, female   DOB: 05-18-1945, 76 y.o.   MRN: [id number]         Chief Complaint:: wellness exam and low HR, htn, preDM, vit D deficiency       HPI:  Leah Mata is a 76 y.o. female here for wellness exam; due for flu shot, decliens covid booster and shingrix, o/w up to date                        Also Pt denies chest pain, increased sob or doe, wheezing, orthopnea, PND, increased LE swelling, palpitations, dizziness or syncope.   Pt denies polydipsia, polyuria, or new focal neuro s/s.   Pt denies fever, wt loss, night sweats, loss of appetite, or other constitutional symptoms  No other new complaints.  Needs med refills.  Sees cardiology as well    Wt Readings from Last 3 Encounters:  04/20/21 179 lb (81.2 kg)  12/10/20 163 lb (73.9 kg)  10/17/20 173 lb (78.5 kg)   BP Readings from Last 3 Encounters:  04/20/21 120/60  12/10/20 128/60  10/17/20 130/64   Immunization History  Administered Date(s) Administered   Fluad Quad(high Dose 65+) 01/20/2019, 04/20/2021   Influenza, High Dose Seasonal PF 03/12/2017, 04/06/2018, 03/07/2019   Influenza,inj,Quad PF,6+ Mos 12/01/2012, 01/31/2014, 02/24/2016   Influenza-Unspecified 11/28/2011, 01/11/2015   Pneumococcal Conjugate-13 08/15/2014   Pneumococcal Polysaccharide-23 05/30/2012   Td 11/28/2006   Tdap 09/07/2016   Zoster, Live 11/29/2008   There are no preventive care reminders to display for this patient.     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   INSOMNIA, HX OF 01/06/2007   Other and unspecified hyperlipidemia 08/22/2013   Urinary frequency 03/14/2009   Past Surgical History:  Procedure Laterality Date   ABDOMINAL AORTIC ANEURYSM REPAIR N/A 04/28/2018   Procedure: ANEURYSM ABDOMINAL AORTIC REPAIR WITH HEMASHIELD GOLD VASCULAR GRAFT 22X11  mm;  Surgeon: Larina Earthly, MD;  Location: [facility];  Service: Vascular;  Laterality: N/A;   CARDIAC CATHETERIZATION  2003   negative   DILATION AND CURETTAGE OF UTERUS     TONSILLECTOMY     TUBAL LIGATION      reports that she has quit smoking. Her smoking use included cigarettes. She has never used smokeless tobacco. She reports that she does not drink alcohol and does not use drugs. family history includes Congestive Heart Failure in her mother; Coronary artery disease in an other family member; Diabetes in her father and mother; Heart attack in her father; Hypertension in her mother. Allergies  Allergen Reactions   Hydrochlorothiazide Other (See Comments)    unknown   Current Outpatient Medications on File Prior to Visit  Medication Sig Dispense Refill   ALPRAZolam (XANAX) 0.25 MG tablet TAKE 1 TABLET BY MOUTH TWICE A DAY AS NEEDED FOR ANXIETY 60 tablet 2   amLODipine (NORVASC) 10 MG tablet TAKE 1 TABLET EVERY DAY 90 tablet 1   apixaban (ELIQUIS) 5 MG TABS tablet Take 1 tablet (5 mg total) by mouth 2 (two) times daily. 60 tablet 6   citalopram (CELEXA) 10 MG tablet TAKE 1 TABLET EVERY DAY 90 tablet 1   fluticasone (FLONASE) 50 MCG/ACT nasal spray Place 1 spray into both nostrils as needed for allergies or rhinitis.     glycerin adult  2 g suppository      levalbuterol (XOPENEX) 1.25 MG/0.5ML nebulizer solution Take 1.25 mg by nebulization every 6 (six) hours as needed for wheezing or shortness of breath. 270 each 1   lisinopril (ZESTRIL) 20 MG tablet TAKE 1 TABLET TWICE DAILY 180 tablet 3   metoprolol tartrate (LOPRESSOR) 50 MG tablet TAKE 1 TABLET TWICE DAILY 180 tablet 1   ondansetron (ZOFRAN) 4 MG tablet Take 1 tablet (4 mg total) by mouth every 8 (eight) hours as needed for up to 20 doses for nausea or vomiting. 20 tablet 0   pravastatin (PRAVACHOL) 20 MG tablet TAKE 1 TABLET EVERY DAY 90 tablet 1   promethazine-codeine (PHENERGAN WITH CODEINE) 6.25-10 MG/5ML syrup Take 5 mLs by mouth  every 6 (six) hours as needed for cough. 180 mL 0   triamcinolone (KENALOG) 0.1 % Apply 1 application topically 2 (two) times daily. 30 g 0   triamcinolone cream (KENALOG) 0.1 % APPLY TOPICALLY TWICE DAILY 30 g 0   Vitamin D, Ergocalciferol, (DRISDOL) 1.25 MG (50000 UT) CAPS capsule Take 1 capsule (50,000 Units total) by mouth every 7 (seven) days. 12 capsule 0   No current facility-administered medications on file prior to visit.        ROS:  All others reviewed and negative.  Objective        PE:  BP 120/60    Pulse (!) 48    Temp 98.8 F (37.1 C) (Oral)    Ht 5\' 6"  (1.676 m)    Wt 179 lb (81.2 kg)    LMP 03/29/1993 (Approximate)    SpO2 96%    BMI 28.89 kg/m                 Constitutional: Pt appears in NAD               HENT: Head: NCAT.                Right Ear: External ear normal.                 Left Ear: External ear normal.                Eyes: . Pupils are equal, round, and reactive to light. Conjunctivae and EOM are normal               Nose: without d/c or deformity               Neck: Neck supple. Gross normal ROM               Cardiovascular: Normal rate and regular rhythm.                 Pulmonary/Chest: Effort normal and breath sounds without rales or wheezing.                Abd:  Soft, NT, ND, + BS, no organomegaly               Neurological: Pt is alert. At baseline orientation, motor grossly intact               Skin: Skin is warm. No rashes, no other new lesions, LE edema - none               Psychiatric: Pt behavior is normal without agitation   Micro: none  Cardiac tracings I have personally interpreted today:  none  Pertinent Radiological findings (summarize): none   Lab Results  Component  Value Date   WBC 12.6 (H) 08/30/2020   HGB 13.7 08/30/2020   HCT 41.6 08/30/2020   PLT 197 08/30/2020   GLUCOSE 117 (H) 08/30/2020   CHOL 132 09/06/2019   TRIG 189.0 (H) 09/06/2019   HDL 40.50 09/06/2019   LDLDIRECT 84.1 01/31/2014   LDLCALC 54 09/06/2019    ALT 10 09/06/2019   AST 13 09/06/2019   NA 135 08/30/2020   K 4.6 08/30/2020   CL 100 08/30/2020   CREATININE 1.05 (H) 08/30/2020   BUN 16 08/30/2020   CO2 25 08/30/2020   TSH 0.94 09/06/2019   INR 1.16 04/30/2018   HGBA1C 6.0 09/06/2019   Assessment/Plan:  Leah Mata is a 76 y.o. White or Caucasian [1] female with  has a past medical history of BACK PAIN (03/14/2009), COPD (11/01/2006), GLUCOSE INTOLERANCE (03/14/2009), Headache(784.0) (03/14/2009), HYPERTENSION (11/01/2006), Hypertrophy of tongue papillae (06/05/2009), INSOMNIA, HX OF (01/06/2007), Other and unspecified hyperlipidemia (08/22/2013), and Urinary frequency (03/14/2009).  Encounter for well adult exam with abnormal findings Age and sex appropriate education and counseling updated with regular exercise and diet Referrals for preventative services - none needed Immunizations addressed - for flu shot today, declines covid booster and shingrix Smoking counseling  - none needed Evidence for depression or other mood disorder - none significant Most recent labs reviewed. I have personally reviewed and have noted: 1) the patient's medical and social history 2) The patient's current medications and supplements 3) The patient's height, weight, and BMI have been recorded in the chart   Bradycardia Mild to high 40's incidentally noted today, asympt, pt declines change of tx for now,  to f/u any worsening symptoms or concerns  Essential hypertension BP Readings from Last 3 Encounters:  04/20/21 120/60  12/10/20 128/60  10/17/20 130/64   Stable, pt to continue medical treatment norvasc, lisinopril, lopressor   Hyperlipidemia Lab Results  Component Value Date   LDLCALC 54 09/06/2019   Stable, pt to continue current statin pravachol 20   Pre-diabetes Lab Results  Component Value Date   HGBA1C 6.0 09/06/2019   Stable, pt to continue current medical treatment  - diet   Vitamin D deficiency Last vitamin D Lab  Results  Component Value Date   VD25OH 40.24 09/06/2019   Stable, cont oral replacement  Followup: Return in about 6 months (around 10/18/2021).  Oliver BarreJames Mirtha Jain, MD 04/20/2021 1:31 PM [facility]

## 2021-04-20 NOTE — Assessment & Plan Note (Signed)
Lab Results  Component Value Date   HGBA1C 6.0 09/06/2019   Stable, pt to continue current medical treatment  - diet  

## 2021-04-21 ENCOUNTER — Encounter: Payer: Self-pay | Admitting: Internal Medicine

## 2021-04-27 ENCOUNTER — Other Ambulatory Visit: Payer: Self-pay | Admitting: Internal Medicine

## 2021-05-12 ENCOUNTER — Telehealth (HOSPITAL_BASED_OUTPATIENT_CLINIC_OR_DEPARTMENT_OTHER): Payer: Self-pay

## 2021-05-12 NOTE — Telephone Encounter (Signed)
-  Received letter from [employer] stating pt was denied patient assistance due to not meeting 3% out of pocket expense. -Pt made aware and verbalized understanding.

## 2021-05-12 NOTE — Telephone Encounter (Signed)
-  Called pt to inform we received a second fax from [facility] stating she has been approved from 05/12/21-03/28/22. - Pt verbalized understanding.

## 2021-06-22 ENCOUNTER — Ambulatory Visit (INDEPENDENT_AMBULATORY_CARE_PROVIDER_SITE_OTHER): Payer: Medicare HMO | Admitting: Internal Medicine

## 2021-06-22 ENCOUNTER — Other Ambulatory Visit: Payer: Self-pay

## 2021-06-22 ENCOUNTER — Encounter: Payer: Self-pay | Admitting: Internal Medicine

## 2021-06-22 VITALS — BP 112/50 | HR 49 | Temp 98.2°F | Ht 66.0 in | Wt 185.0 lb

## 2021-06-22 DIAGNOSIS — E559 Vitamin D deficiency, unspecified: Secondary | ICD-10-CM | POA: Diagnosis not present

## 2021-06-22 DIAGNOSIS — Z7901 Long term (current) use of anticoagulants: Secondary | ICD-10-CM

## 2021-06-22 DIAGNOSIS — K439 Ventral hernia without obstruction or gangrene: Secondary | ICD-10-CM

## 2021-06-22 NOTE — Assessment & Plan Note (Signed)
Large, asympt b, for referral general surgury,  to f/u any worsening symptoms or concerns ?

## 2021-06-22 NOTE — Assessment & Plan Note (Signed)
Ok for hold Eliquis x 2 days prior to surgury ?

## 2021-06-22 NOTE — Patient Instructions (Signed)
Please continue all other medications as before, and refills have been done if requested. ? ?Please have the pharmacy call with any other refills you may need. ? ?Please continue your efforts at being more active, low cholesterol diet, and weight control. ? ?Please keep your appointments with your specialists as you may have planned ? ?You will be contacted regarding the referral for: Dr Rayburn Ma ?

## 2021-06-22 NOTE — Assessment & Plan Note (Signed)
Last vitamin D Lab Results  Component Value Date   VD25OH 32.45 04/20/2021   Low, to start oral replacement  

## 2021-06-22 NOTE — Progress Notes (Signed)
Patient ID: Leah Mata, female   DOB: February 24, 1946, 76 y.o.   MRN: [id number] ? ? ? ?    Chief Complaint: follow up abd wall hernia, eliquis use, low vit d ? ?     HPI:  Leah Mata is a 76 y.o. female here with c/o ongoing abd wall hernia, quite large, no pain and Denies worsening reflux, abd pain, dysphagia, n/v, bowel change or blood.  Pt denies chest pain, increased sob or doe, wheezing, orthopnea, PND, increased LE swelling, palpitations, dizziness or syncope.   Pt denies polydipsia, polyuria, or new focal neuro s/s.  Continues with eliquis, no overt bleeding.  Not taking Vit  D ?      ?Wt Readings from Last 3 Encounters:  ?06/22/21 185 lb (83.9 kg)  ?04/20/21 179 lb (81.2 kg)  ?12/10/20 163 lb (73.9 kg)  ? ?BP Readings from Last 3 Encounters:  ?06/22/21 (!) 112/50  ?04/20/21 120/60  ?12/10/20 128/60  ? ?      ?Past Medical History:  ?Diagnosis Date  ? BACK PAIN 03/14/2009  ? COPD 11/01/2006  ? GLUCOSE INTOLERANCE 03/14/2009  ? Headache(784.0) 03/14/2009  ? HYPERTENSION 11/01/2006  ? 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 INSOMNIA, HX OF 01/06/2007  ? Other and unspecified hyperlipidemia 08/22/2013  ? Urinary frequency 03/14/2009  ? ?Past Surgical History:  ?Procedure Laterality Date  ? ABDOMINAL AORTIC ANEURYSM REPAIR N/A 04/28/2018  ? Procedure: ANEURYSM ABDOMINAL AORTIC REPAIR WITH HEMASHIELD GOLD VASCULAR GRAFT 22X11 mm;  Surgeon: Rosetta Posner, MD;  Location: [place];  Service: Vascular;  Laterality: N/A;  ? CARDIAC CATHETERIZATION  2003  ? negative  ? DILATION AND CURETTAGE OF UTERUS    ? TONSILLECTOMY    ? TUBAL LIGATION    ? ? reports that she has quit smoking. Her smoking use included cigarettes. She has never used smokeless tobacco. She reports that she does not drink alcohol and does not use drugs. ?family history includes Congestive Heart Failure in her mother; Coronary artery disease in an other family member; Diabetes in her father and mother; Heart attack in her father; Hypertension in her  mother. ?Allergies  ?Allergen Reactions  ? Hydrochlorothiazide Other (See Comments)  ?  unknown  ? ?Current Outpatient Medications on File Prior to Visit  ?Medication Sig Dispense Refill  ? ALPRAZolam (XANAX) 0.25 MG tablet TAKE 1 TABLET BY MOUTH TWICE A DAY AS NEEDED FOR ANXIETY 60 tablet 2  ? amLODipine (NORVASC) 10 MG tablet TAKE 1 TABLET EVERY DAY 90 tablet 1  ? apixaban (ELIQUIS) 5 MG TABS tablet Take 1 tablet (5 mg total) by mouth 2 (two) times daily. 60 tablet 6  ? citalopram (CELEXA) 10 MG tablet TAKE 1 TABLET EVERY DAY 90 tablet 3  ? fluticasone (FLONASE) 50 MCG/ACT nasal spray Place 1 spray into both nostrils as needed for allergies or rhinitis.    ? glycerin adult 2 g suppository     ? levalbuterol (XOPENEX) 1.25 MG/0.5ML nebulizer solution Take 1.25 mg by nebulization every 6 (six) hours as needed for wheezing or shortness of breath. 270 each 1  ? lisinopril (ZESTRIL) 20 MG tablet TAKE 1 TABLET TWICE DAILY 180 tablet 3  ? metoprolol tartrate (LOPRESSOR) 50 MG tablet TAKE 1 TABLET TWICE DAILY 180 tablet 1  ? ondansetron (ZOFRAN) 4 MG tablet Take 1 tablet (4 mg total) by mouth every 8 (eight) hours as needed for up to 20 doses for nausea or vomiting. 20 tablet 0  ? pravastatin (PRAVACHOL)  20 MG tablet TAKE 1 TABLET EVERY DAY 90 tablet 3  ? triamcinolone cream (KENALOG) 0.1 % APPLY TOPICALLY TWICE DAILY 30 g 0  ? Vitamin D, Ergocalciferol, (DRISDOL) 1.25 MG (50000 UT) CAPS capsule Take 1 capsule (50,000 Units total) by mouth every 7 (seven) days. 12 capsule 0  ? ?No current facility-administered medications on file prior to visit.  ? ?     ROS:  All others reviewed and negative. ? ?Objective  ? ?     PE:  BP (!) 112/50 (BP Location: Left Arm, Patient Position: Sitting, Cuff Size: Normal)   Pulse (!) 49   Temp 98.2 ?F (36.8 ?C) (Oral)   Ht 5\' 6"  (1.676 m)   Wt 185 lb (83.9 kg)   LMP 03/29/1993 (Approximate)   SpO2 96%   BMI 29.86 kg/m?  ? ?              Constitutional: Pt appears in NAD ?               HENT: Head: NCAT.  ?              Right Ear: External ear normal.   ?              Left Ear: External ear normal.  ?              Eyes: . Pupils are equal, round, and reactive to light. Conjunctivae and EOM are normal ?              Nose: without d/c or deformity ?              Neck: Neck supple. Gross normal ROM ?              Cardiovascular: Normal rate and regular rhythm.   ?              Pulmonary/Chest: Effort normal and breath sounds without rales or wheezing.  ?              Abd:  Soft, NT, ND, + BS, no organomegaly, large abd wall hernia without tender ?              Neurological: Pt is alert. At baseline orientation, motor grossly intact ?              Skin: Skin is warm. No rashes, no other new lesions, LE edema - none ?              Psychiatric: Pt behavior is normal without agitation  ? ?Micro: none ? ?Cardiac tracings I have personally interpreted today:  none ? ?Pertinent Radiological findings (summarize): none  ? ?Lab Results  ?Component Value Date  ? WBC 9.4 04/20/2021  ? HGB 13.5 04/20/2021  ? HCT 41.7 04/20/2021  ? PLT 197.0 04/20/2021  ? GLUCOSE 90 04/20/2021  ? CHOL 151 04/20/2021  ? TRIG 224.0 (H) 04/20/2021  ? HDL 47.00 04/20/2021  ? LDLDIRECT 79.0 04/20/2021  ? [place] 54 09/06/2019  ? ALT 13 04/20/2021  ? AST 19 04/20/2021  ? NA 137 04/20/2021  ? K 4.4 04/20/2021  ? CL 101 04/20/2021  ? CREATININE 0.93 04/20/2021  ? BUN 15 04/20/2021  ? CO2 28 04/20/2021  ? TSH 0.88 04/20/2021  ? INR 1.16 04/30/2018  ? HGBA1C 6.2 04/20/2021  ? ?Assessment/Plan:  ?Leah Mata is a 76 y.o. White or Caucasian [1] female with  has a past medical history of BACK PAIN (  03/14/2009), COPD (11/01/2006), GLUCOSE INTOLERANCE (03/14/2009), Headache(784.0) (03/14/2009), HYPERTENSION (11/01/2006), Hypertrophy of tongue papillae (06/05/2009), INSOMNIA, HX OF (01/06/2007), Other and unspecified hyperlipidemia (08/22/2013), and Urinary frequency (03/14/2009). ? ?Vitamin D deficiency ?Last vitamin D ?Lab Results  ?Component Value  Date  ? VD25OH 32.45 04/20/2021  ? ?Low, to start oral replacement ? ? ?Chronic anticoagulation ?Ok for hold Eliquis x 2 days prior to surgury ? ?Abdominal wall hernia ?Large, asympt b, for referral general surgury,  to f/u any worsening symptoms or concerns ? ?Followup: Return if symptoms worsen or fail to improve. ? ?Cathlean Cower, MD 06/22/2021 9:21 PM ?[place] ?[place] ?Internal Medicine ?

## 2021-06-24 ENCOUNTER — Other Ambulatory Visit: Payer: Self-pay | Admitting: Internal Medicine

## 2021-06-24 NOTE — Telephone Encounter (Signed)
Please refill as per office routine med refill policy (all routine meds to be refilled for 3 mo or monthly (per pt preference) up to one year from last visit, then month to month grace period for 3 mo, then further med refills will have to be denied) ? ?

## 2021-08-14 ENCOUNTER — Other Ambulatory Visit: Payer: Self-pay | Admitting: Surgery

## 2021-08-14 DIAGNOSIS — K430 Incisional hernia with obstruction, without gangrene: Secondary | ICD-10-CM | POA: Diagnosis not present

## 2021-08-26 ENCOUNTER — Other Ambulatory Visit: Payer: Self-pay | Admitting: Surgery

## 2021-08-26 DIAGNOSIS — K430 Incisional hernia with obstruction, without gangrene: Secondary | ICD-10-CM

## 2021-09-21 ENCOUNTER — Ambulatory Visit
Admission: RE | Admit: 2021-09-21 | Discharge: 2021-09-21 | Disposition: A | Discharge status: Home / Self Care | Payer: Medicare HMO | Source: Ambulatory Visit | Attending: Surgery | Admitting: Surgery

## 2021-09-21 DIAGNOSIS — K432 Incisional hernia without obstruction or gangrene: Secondary | ICD-10-CM | POA: Diagnosis not present

## 2021-09-21 DIAGNOSIS — K430 Incisional hernia with obstruction, without gangrene: Secondary | ICD-10-CM

## 2021-09-21 DIAGNOSIS — K439 Ventral hernia without obstruction or gangrene: Secondary | ICD-10-CM | POA: Diagnosis not present

## 2021-09-21 MED ORDER — IOPAMIDOL (ISOVUE-300) INJECTION 61%
100.0000 mL | Freq: Once | INTRAVENOUS | Status: AC | PRN
  Administered 2021-09-21: 100 mL via INTRAVENOUS

## 2021-10-13 DIAGNOSIS — J449 Chronic obstructive pulmonary disease, unspecified: Secondary | ICD-10-CM | POA: Diagnosis not present

## 2021-10-13 DIAGNOSIS — K432 Incisional hernia without obstruction or gangrene: Secondary | ICD-10-CM | POA: Diagnosis not present

## 2021-10-13 DIAGNOSIS — K4020 Bilateral inguinal hernia, without obstruction or gangrene, not specified as recurrent: Secondary | ICD-10-CM | POA: Diagnosis not present

## 2021-10-13 DIAGNOSIS — Z9889 Other specified postprocedural states: Secondary | ICD-10-CM | POA: Diagnosis not present

## 2021-10-15 ENCOUNTER — Telehealth (HOSPITAL_BASED_OUTPATIENT_CLINIC_OR_DEPARTMENT_OTHER): Payer: Self-pay

## 2021-10-15 NOTE — Telephone Encounter (Signed)
   Pre-operative Risk Assessment    Patient Name: Leah Mata  DOB: 02-Jun-1945 MRN: [id number]      Request for Surgical Clearance    Procedure:   Hernia Repair  Date of Surgery:  Clearance TBD                                 Surgeon:  Ivar Drape, MD Surgeon's Group or Practice Name:  [facility]   Phone number:  [phone] Fax number:  [phone]   Type of Clearance Requested:   - Medical  - Pharmacy:  Hold Apixaban (Eliquis) ---how long?   Type of Anesthesia:  General    Additional requests/questions:   None  Signed, Jeanene Erb   10/15/2021, 10:27 AM

## 2021-10-16 NOTE — Telephone Encounter (Signed)
Patient with diagnosis of PAF on Eliquis for anticoagulation.    Procedure: Hernia Repair Date of procedure: TBD   CHA2DS2-VASc Score = 5  This indicates a 7.2% annual risk of stroke. The patient's score is based upon: CHF History: 1 HTN History: 1 Diabetes History: 0 Stroke History: 0 Vascular Disease History: 1 Age Score: 1 Gender Score: 1   CrCl 70 ml/min Platelet count 197K   Per office protocol, patient can hold Eliquis for 2 days prior to procedure.    **This guidance is not considered finalized until pre-operative APP has relayed final recommendations.**

## 2021-10-16 NOTE — Telephone Encounter (Signed)
Primary Cardiologist:Bridgette Cristal Deer, MD   Preoperative team, please contact this patient and set up a phone call appointment for further preoperative risk assessment. Please obtain consent and complete medication review. Thank you for your help.   I confirm that guidance regarding antiplatelet and oral anticoagulation therapy has been completed and, if necessary, noted below.   Levi Aland, NP-C    10/16/2021, 11:40 AM [facility] [address], [address] Office [phone] Fax [phone]

## 2021-10-21 ENCOUNTER — Telehealth: Payer: Self-pay | Admitting: *Deleted

## 2021-10-21 NOTE — Telephone Encounter (Signed)
Pt agreeable to plan of care for tele pre op appt 10/30/21 @ 10 am. Med rec and consent are done.

## 2021-10-21 NOTE — Telephone Encounter (Signed)
Pt agreeable to plan of care for tele pre op appt 10/30/21 @ 10 am. Med rec and consent are done.      Patient Consent for Virtual Visit        Leah Mata has provided verbal consent on 10/21/2021 for a virtual visit (video or telephone).   CONSENT FOR VIRTUAL VISIT FOR:  Leah Mata  By participating in this virtual visit I agree to the following:  I hereby voluntarily request, consent and authorize [facility] and its employed or contracted physicians, physician assistants, nurse practitioners or other licensed health care professionals (the Practitioner), to provide me with telemedicine health care services (the "Services") as deemed necessary by the treating Practitioner. I acknowledge and consent to receive the Services by the Practitioner via telemedicine. I understand that the telemedicine visit will involve communicating with the Practitioner through live audiovisual communication technology and the disclosure of certain medical information by electronic transmission. I acknowledge that I have been given the opportunity to request an in-person assessment or other available alternative prior to the telemedicine visit and am voluntarily participating in the telemedicine visit.  I understand that I have the right to withhold or withdraw my consent to the use of telemedicine in the course of my care at any time, without affecting my right to future care or treatment, and that the Practitioner or I may terminate the telemedicine visit at any time. I understand that I have the right to inspect all information obtained and/or recorded in the course of the telemedicine visit and may receive copies of available information for a reasonable fee.  I understand that some of the potential risks of receiving the Services via telemedicine include:  Delay or interruption in medical evaluation due to technological equipment failure or disruption; Information transmitted may not be sufficient (e.g.  poor resolution of images) to allow for appropriate medical decision making by the Practitioner; and/or  In rare instances, security protocols could fail, causing a breach of personal health information.  Furthermore, I acknowledge that it is my responsibility to provide information about my medical history, conditions and care that is complete and accurate to the best of my ability. I acknowledge that Practitioner's advice, recommendations, and/or decision may be based on factors not within their control, such as incomplete or inaccurate data provided by me or distortions of diagnostic images or specimens that may result from electronic transmissions. I understand that the practice of medicine is not an exact science and that Practitioner makes no warranties or guarantees regarding treatment outcomes. I acknowledge that a copy of this consent can be made available to me via my patient portal [facility] MyChart), or I can request a printed copy by calling the office of [facility].    I understand that my insurance will be billed for this visit.   I have read or had this consent read to me. I understand the contents of this consent, which adequately explains the benefits and risks of the Services being provided via telemedicine.  I have been provided ample opportunity to ask questions regarding this consent and the Services and have had my questions answered to my satisfaction. I give my informed consent for the services to be provided through the use of telemedicine in my medical care

## 2021-10-26 ENCOUNTER — Emergency Department (HOSPITAL_BASED_OUTPATIENT_CLINIC_OR_DEPARTMENT_OTHER): Payer: Medicare HMO | Admitting: Radiology

## 2021-10-26 ENCOUNTER — Observation Stay (HOSPITAL_BASED_OUTPATIENT_CLINIC_OR_DEPARTMENT_OTHER)
Admission: EM | Admit: 2021-10-26 | Discharge: 2021-10-27 | Disposition: A | Discharge status: Home / Self Care | Payer: Medicare HMO | Attending: Internal Medicine | Admitting: Internal Medicine

## 2021-10-26 ENCOUNTER — Other Ambulatory Visit: Payer: Self-pay

## 2021-10-26 ENCOUNTER — Emergency Department (HOSPITAL_BASED_OUTPATIENT_CLINIC_OR_DEPARTMENT_OTHER): Payer: Medicare HMO

## 2021-10-26 ENCOUNTER — Encounter (HOSPITAL_BASED_OUTPATIENT_CLINIC_OR_DEPARTMENT_OTHER): Payer: Self-pay | Admitting: Emergency Medicine

## 2021-10-26 DIAGNOSIS — I700 Atherosclerosis of aorta: Secondary | ICD-10-CM | POA: Diagnosis not present

## 2021-10-26 DIAGNOSIS — R0602 Shortness of breath: Secondary | ICD-10-CM | POA: Diagnosis not present

## 2021-10-26 DIAGNOSIS — R531 Weakness: Secondary | ICD-10-CM | POA: Diagnosis not present

## 2021-10-26 DIAGNOSIS — I214 Non-ST elevation (NSTEMI) myocardial infarction: Secondary | ICD-10-CM | POA: Diagnosis not present

## 2021-10-26 DIAGNOSIS — E1165 Type 2 diabetes mellitus with hyperglycemia: Secondary | ICD-10-CM | POA: Diagnosis not present

## 2021-10-26 DIAGNOSIS — R778 Other specified abnormalities of plasma proteins: Secondary | ICD-10-CM | POA: Diagnosis not present

## 2021-10-26 DIAGNOSIS — I480 Paroxysmal atrial fibrillation: Secondary | ICD-10-CM | POA: Insufficient documentation

## 2021-10-26 DIAGNOSIS — Z7901 Long term (current) use of anticoagulants: Secondary | ICD-10-CM | POA: Diagnosis not present

## 2021-10-26 DIAGNOSIS — J449 Chronic obstructive pulmonary disease, unspecified: Secondary | ICD-10-CM | POA: Diagnosis not present

## 2021-10-26 DIAGNOSIS — R7989 Other specified abnormal findings of blood chemistry: Secondary | ICD-10-CM | POA: Diagnosis present

## 2021-10-26 DIAGNOSIS — Z87891 Personal history of nicotine dependence: Secondary | ICD-10-CM | POA: Diagnosis not present

## 2021-10-26 DIAGNOSIS — I110 Hypertensive heart disease with heart failure: Secondary | ICD-10-CM | POA: Diagnosis not present

## 2021-10-26 DIAGNOSIS — Z79899 Other long term (current) drug therapy: Secondary | ICD-10-CM | POA: Insufficient documentation

## 2021-10-26 DIAGNOSIS — I5030 Unspecified diastolic (congestive) heart failure: Secondary | ICD-10-CM | POA: Diagnosis not present

## 2021-10-26 LAB — CBC
HCT: 38.9 % (ref 36.0–46.0)
Hemoglobin: 12.9 g/dL (ref 12.0–15.0)
MCH: 31.1 pg (ref 26.0–34.0)
MCHC: 33.2 g/dL (ref 30.0–36.0)
MCV: 93.7 fL (ref 80.0–100.0)
Platelets: 194 K/uL (ref 150–400)
RBC: 4.15 MIL/uL (ref 3.87–5.11)
RDW: 12.6 % (ref 11.5–15.5)
WBC: 13.2 K/uL — ABNORMAL HIGH (ref 4.0–10.5)
nRBC: 0 % (ref 0.0–0.2)

## 2021-10-26 LAB — URINALYSIS, ROUTINE W REFLEX MICROSCOPIC
Bilirubin Urine: NEGATIVE
Glucose, UA: NEGATIVE mg/dL
Hgb urine dipstick: NEGATIVE
Ketones, ur: NEGATIVE mg/dL
Leukocytes,Ua: NEGATIVE
Nitrite: NEGATIVE
Protein, ur: NEGATIVE mg/dL
Specific Gravity, Urine: <1.005 — ABNORMAL LOW (ref 1.005–1.030)
pH: 6.5 (ref 5.0–8.0)

## 2021-10-26 LAB — BASIC METABOLIC PANEL WITH GFR
Anion gap: 12 (ref 5–15)
BUN: 19 mg/dL (ref 8–23)
CO2: 22 mmol/L (ref 22–32)
Calcium: 10.3 mg/dL (ref 8.9–10.3)
Chloride: 100 mmol/L (ref 98–111)
Creatinine, Ser: 1.14 mg/dL — ABNORMAL HIGH (ref 0.44–1.00)
GFR, Estimated: 51 mL/min — ABNORMAL LOW
Glucose, Bld: 156 mg/dL — ABNORMAL HIGH (ref 70–99)
Potassium: 4.3 mmol/L (ref 3.5–5.1)
Sodium: 134 mmol/L — ABNORMAL LOW (ref 135–145)

## 2021-10-26 LAB — TROPONIN I (HIGH SENSITIVITY)
Troponin I (High Sensitivity): 37 ng/L — ABNORMAL HIGH (ref <18)
Troponin I (High Sensitivity): 116 ng/L
Troponin I (High Sensitivity): 234 ng/L

## 2021-10-26 LAB — BRAIN NATRIURETIC PEPTIDE: B Natriuretic Peptide: 253.1 pg/mL — ABNORMAL HIGH (ref 0.0–100.0)

## 2021-10-26 LAB — D-DIMER, QUANTITATIVE: D-Dimer, Quant: 1.51 ug/mL-FEU — ABNORMAL HIGH (ref 0.00–0.50)

## 2021-10-26 MED ORDER — IOHEXOL 350 MG/ML SOLN
100.0000 mL | Freq: Once | INTRAVENOUS | Status: AC | PRN
Start: 2021-10-26 — End: 2021-10-26
  Administered 2021-10-26: 80 mL via INTRAVENOUS

## 2021-10-26 MED ORDER — SODIUM CHLORIDE 0.9 % IV BOLUS
1000.0000 mL | Freq: Once | INTRAVENOUS | Status: AC
  Administered 2021-10-26: 1000 mL via INTRAVENOUS

## 2021-10-26 NOTE — ED Notes (Signed)
[facility] arrived to transport pt. Pt stable at time of departure ?

## 2021-10-26 NOTE — ED Provider Notes (Signed)
[facility] EMERGENCY DEPT Provider Note   CSN: [id number] Arrival date & time: 10/26/21  1411     History  Chief Complaint  Patient presents with   Leah Mata is a 76 y.o. female.  Patient with history of AAA repair, paroxysmal atrial fibrillation on Eliquis, hypertension, high cholesterol, abdominal wall hernia--presents to the emergency department for evaluation of weakness.  Patient states that she was feeling well earlier today but had an episode where she leaned over to pick up her keys off of the floor.  She states that her legs became very weak bilaterally and she could not stand up.  She eventually sat down on the floor.  She did not hit her head or hurt her neck.  She was assisted by family members and had difficulty walking because she felt like her "legs were giving out".  Denies acute complaints such as fevers, URI symptoms.  No cough or shortness of breath.  No chest pain or abdominal pain.  No recent nausea, vomiting, or diarrhea.  No urinary symptoms such as dysuria, increased frequency or urgency, or hematuria.  No lower extremity swelling or skin rashes.       Home Medications Prior to Admission medications   Medication Sig Start Date End Date Taking? Authorizing Provider  ALPRAZolam Prudy Feeler) 0.25 MG tablet TAKE 1 TABLET BY MOUTH TWICE A DAY AS NEEDED FOR ANXIETY 10/28/20   Corwin Levins, MD  amLODipine (NORVASC) 10 MG tablet TAKE 1 TABLET EVERY DAY 03/27/21   Corwin Levins, MD  apixaban (ELIQUIS) 5 MG TABS tablet Take 1 tablet (5 mg total) by mouth 2 (two) times daily. 07/26/18   Jodelle Red, MD  citalopram (CELEXA) 10 MG tablet TAKE 1 TABLET EVERY DAY 04/27/21   Corwin Levins, MD  fluticasone [facility]) 50 MCG/ACT nasal spray Place 1 spray into both nostrils as needed for allergies or rhinitis.    [provider]  glycerin adult 2 g suppository  09/03/14   [provider]  levalbuterol Pauline Aus) 1.25 MG/0.5ML nebulizer  solution Take 1.25 mg by nebulization every 6 (six) hours as needed for wheezing or shortness of breath. 06/16/18   Corwin Levins, MD  lisinopril (ZESTRIL) 20 MG tablet TAKE 1 TABLET TWICE DAILY 03/27/21   Jodelle Red, MD  metoprolol tartrate (LOPRESSOR) 50 MG tablet TAKE 1 TABLET TWICE DAILY 06/26/21   Corwin Levins, MD  ondansetron (ZOFRAN) 4 MG tablet Take 1 tablet (4 mg total) by mouth every 8 (eight) hours as needed for up to 20 doses for nausea or vomiting. 08/30/20   Curatolo, Adam, DO  pravastatin (PRAVACHOL) 20 MG tablet TAKE 1 TABLET EVERY DAY 04/27/21   Corwin Levins, MD  triamcinolone cream (KENALOG) 0.1 % APPLY TOPICALLY TWICE DAILY 01/08/21   Corwin Levins, MD  Vitamin D, Ergocalciferol, (DRISDOL) 1.25 MG (50000 UT) CAPS capsule Take 1 capsule (50,000 Units total) by mouth every 7 (seven) days. 09/06/18   Corwin Levins, MD      Allergies    Hydrochlorothiazide    Review of Systems   Review of Systems  Physical Exam Updated Vital Signs BP (!) 131/98 (BP Location: Right Arm)   Pulse 64   Temp 97.6 F (36.4 C)   Resp 18   Ht 5\' 6"  (1.676 m)   Wt 83.9 kg   LMP 03/29/1993 (Approximate)   SpO2 98%   BMI 29.86 kg/m   Physical Exam Vitals and nursing note  reviewed.  Constitutional:      Appearance: She is well-developed. She is not diaphoretic.  HENT:     Head: Normocephalic and atraumatic.     Right Ear: External ear normal.     Left Ear: External ear normal.     Mouth/Throat:     Mouth: Mucous membranes are not dry.  Eyes:     Conjunctiva/sclera: Conjunctivae normal.  Neck:     Vascular: Normal carotid pulses. No JVD.     Trachea: Trachea normal. No tracheal deviation.  Cardiovascular:     Rate and Rhythm: Normal rate and regular rhythm.     Pulses: No decreased pulses.          Radial pulses are 2+ on the right side and 2+ on the left side.     Heart sounds: Normal heart sounds, S1 normal and S2 normal. No murmur heard. Pulmonary:     Effort: Pulmonary  effort is normal. No respiratory distress.     Breath sounds: No wheezing.  Chest:     Chest wall: No tenderness.  Abdominal:     General: Bowel sounds are normal.     Palpations: Abdomen is soft.     Tenderness: There is no abdominal tenderness. There is no guarding or rebound.     Comments: Large hernia noted  Musculoskeletal:        General: Normal range of motion.     Cervical back: Normal range of motion and neck supple. No muscular tenderness.  Skin:    General: Skin is warm and dry.     Coloration: Skin is not pale.  Neurological:     Mental Status: She is alert.     ED Results / Procedures / Treatments   Labs (all labs ordered are listed, but only abnormal results are displayed) Labs Reviewed  BASIC METABOLIC PANEL - Abnormal; Notable for the following components:      Result Value   Sodium 134 (*)    Glucose, Bld 156 (*)    Creatinine, Ser 1.14 (*)    GFR, Estimated 51 (*)    All other components within normal limits  CBC - Abnormal; Notable for the following components:   WBC 13.2 (*)    All other components within normal limits  URINALYSIS, ROUTINE W REFLEX MICROSCOPIC - Abnormal; Notable for the following components:   Color, Urine COLORLESS (*)    Specific Gravity, Urine <1.005 (*)    All other components within normal limits  BRAIN NATRIURETIC PEPTIDE - Abnormal; Notable for the following components:   B Natriuretic Peptide 253.1 (*)    All other components within normal limits  D-DIMER, QUANTITATIVE - Abnormal; Notable for the following components:   D-Dimer, Quant 1.51 (*)    All other components within normal limits  TROPONIN I (HIGH SENSITIVITY) - Abnormal; Notable for the following components:   Troponin I (High Sensitivity) 37 (*)    All other components within normal limits  TROPONIN I (HIGH SENSITIVITY) - Abnormal; Notable for the following components:   Troponin I (High Sensitivity) 116 (*)    All other components within normal limits  TROPONIN  I (HIGH SENSITIVITY)    ED ECG REPORT   Date: 10/26/2021  Rate: 58  Rhythm: sinus bradycardia  QRS Axis: normal  Intervals: normal  ST/T Wave abnormalities: nonspecific ST changes  Conduction Disutrbances:none  Narrative Interpretation:   Old EKG Reviewed: unchanged from 08/2020  I have personally reviewed the EKG tracing and agree with the computerized  printout as noted.   Radiology DG Chest 2 View  Result Date: 10/26/2021 CLINICAL DATA:  Weakness in lower extremities EXAM: CHEST - 2 VIEW COMPARISON:  08/30/2020 FINDINGS: Transverse diameter of Edwyna Shell is increased. Thoracic aorta is tortuous and ectatic. There are no signs of pulmonary edema or focal pulmonary consolidation. There is no pleural effusion or pneumothorax. IMPRESSION: No active cardiopulmonary disease. Electronically Signed   By: Ernie Avena M.D.   On: 10/26/2021 14:54    Procedures Procedures    Medications Ordered in ED Medications  sodium chloride 0.9 % bolus 1,000 mL (0 mLs Intravenous Stopped 10/26/21 1830)  iohexol (OMNIPAQUE) 350 MG/ML injection 100 mL (80 mLs Intravenous Contrast Given 10/26/21 1940)  iohexol (OMNIPAQUE) 350 MG/ML injection 100 mL (80 mLs Intravenous Contrast Given 10/26/21 1950)    ED Course/ Medical Decision Making/ A&P    Patient seen and examined. History obtained directly from patient and family at bedside.  Also reviewed previous PCP notes and cardiology clinic notes.  Labs/EKG: Labs ordered in triage reviewed and interpreted.  This includes: CBC with elevated white blood cell count of 13.2, normal hemoglobin, normal platelet count; BMP with minimal hyponatremia at 134, mild hyperglycemia at 156, mild elevation in creatinine at 1.14; troponin elevated at 37.  EKG with sinus bradycardia as above.  No acute ischemic findings.  Imaging: Personally reviewed and interpreted chest x-ray, agree negative.  Medications/Fluids: IV fluid bolus.  Most recent vital signs reviewed and are  as follows: BP (!) 131/98 (BP Location: Right Arm)   Pulse 64   Temp 97.6 F (36.4 C)   Resp 18   Ht 5\' 6"  (1.676 m)   Wt 83.9 kg   LMP 03/29/1993 (Approximate)   SpO2 98%   BMI 29.86 kg/m   Initial impression: Generalized weakness, unclear etiology.  Will need second troponin to ensure stability.  We will also check UA.  Patient appears well, nontoxic at this time.  No active chest pain or shortness of breath.  8:52 PM Reassessment performed multiple times in interim. Patient appears stable.  Labs personally reviewed and interpreted including: Second troponin increased to 116.  Added D-dimer which was elevated to 1.51 and BNP which was 253.  Imaging personally visualized and interpreted including: Added CT imaging of the chest abdomen pelvis to evaluate for PE and dissection.  This was negative for PE and does not show any signs of complication related to her previous AAA repair.  Reviewed pertinent lab work and imaging with patient at bedside. Questions answered.   Most current vital signs reviewed and are as follows: BP 139/68   Pulse 69   Temp 98.2 F (36.8 C) (Oral)   Resp 12   Ht 5\' 6"  (1.676 m)   Wt 83.9 kg   LMP 03/29/1993 (Approximate)   SpO2 97%   BMI 29.86 kg/m   Plan: Admission to hospital.  Earlier I spoke with cardiology on-call Dr. .  Recommended medical admission, cardiology to consult in the morning.  Recommended cycling troponins and echo.  9:01 PM I consulted with Dr. 05/27/1993 of [facility] hospitalist who accept patient for admission.  Patient will be admitted to [facility].  Plan for cardiology consultation in the morning.                             Medical Decision Making Amount and/or Complexity of Data Reviewed Labs: ordered. Radiology: ordered.  Risk Prescription drug management. Decision regarding  hospitalization.   Patient with NSTEMI, unclear etiology.  She presents with generalized weakness only.  No chest pain or shortness  of breath.  No PE and previous AAA repair is stable.  No signs of sepsis.  Question element of congestive heart failure, previous echo with some diastolic dysfunction, however she does not appear to be significantly fluid overloaded and BNP is only mildly elevated.        Final Clinical Impression(s) / ED Diagnoses Final diagnoses:  NSTEMI (non-ST elevated myocardial infarction) (HCC)  Weakness    Rx / DC Orders ED Discharge Orders     None         Renne Crigler, PA-C 10/26/21 2102    Alvira Monday, MD 10/28/21 7824    Alvira Monday, MD 10/28/21 2322

## 2021-10-26 NOTE — ED Triage Notes (Addendum)
Pt here from home with c/o fall after leaning over to get her keys . No loc did not hit her head , but is on blood thinners  , no complaints from the fall beside feeling weak , had to have 2 people pick her up

## 2021-10-26 NOTE — ED Notes (Signed)
Date and time results received: 10/26/21 2300  (use smartphrase ".now" to insert current time)  Test: troponin Critical Value: 234  Name of Provider Notified: Casimer Lanius, MD  Orders Received? Or Actions Taken?:  n/a

## 2021-10-27 ENCOUNTER — Observation Stay (HOSPITAL_BASED_OUTPATIENT_CLINIC_OR_DEPARTMENT_OTHER): Payer: Medicare HMO

## 2021-10-27 DIAGNOSIS — Z01810 Encounter for preprocedural cardiovascular examination: Secondary | ICD-10-CM

## 2021-10-27 DIAGNOSIS — E1165 Type 2 diabetes mellitus with hyperglycemia: Secondary | ICD-10-CM | POA: Diagnosis not present

## 2021-10-27 DIAGNOSIS — I214 Non-ST elevation (NSTEMI) myocardial infarction: Secondary | ICD-10-CM | POA: Diagnosis not present

## 2021-10-27 DIAGNOSIS — Z7901 Long term (current) use of anticoagulants: Secondary | ICD-10-CM | POA: Diagnosis not present

## 2021-10-27 DIAGNOSIS — Z87891 Personal history of nicotine dependence: Secondary | ICD-10-CM | POA: Diagnosis not present

## 2021-10-27 DIAGNOSIS — R778 Other specified abnormalities of plasma proteins: Secondary | ICD-10-CM

## 2021-10-27 DIAGNOSIS — Z79899 Other long term (current) drug therapy: Secondary | ICD-10-CM | POA: Diagnosis not present

## 2021-10-27 DIAGNOSIS — J449 Chronic obstructive pulmonary disease, unspecified: Secondary | ICD-10-CM | POA: Diagnosis not present

## 2021-10-27 DIAGNOSIS — I480 Paroxysmal atrial fibrillation: Secondary | ICD-10-CM

## 2021-10-27 DIAGNOSIS — I110 Hypertensive heart disease with heart failure: Secondary | ICD-10-CM | POA: Diagnosis not present

## 2021-10-27 LAB — ECHOCARDIOGRAM COMPLETE
AR max vel: 3 cm2
AV Area VTI: 2.79 cm2
AV Area mean vel: 2.82 cm2
AV Mean grad: 3 mmHg
AV Peak grad: 5 mmHg
Ao pk vel: 1.12 m/s
Area-P 1/2: 2.33 cm2
Height: 66 in
S' Lateral: 2.2 cm
Weight: 3052.8 oz

## 2021-10-27 LAB — TROPONIN I (HIGH SENSITIVITY)
Troponin I (High Sensitivity): 172 ng/L (ref <18)
Troponin I (High Sensitivity): 137 ng/L (ref <18)

## 2021-10-27 LAB — GLUCOSE, CAPILLARY
Glucose-Capillary: 134 mg/dL — ABNORMAL HIGH (ref 70–99)
Glucose-Capillary: 112 mg/dL — ABNORMAL HIGH (ref 70–99)

## 2021-10-27 LAB — HEMOGLOBIN A1C
Hgb A1c MFr Bld: 6.3 % — ABNORMAL HIGH (ref 4.8–5.6)
Mean Plasma Glucose: 134.11 mg/dL

## 2021-10-27 MED ORDER — ACETAMINOPHEN 325 MG PO TABS
650.0000 mg | ORAL_TABLET | Freq: Four times a day (QID) | ORAL | Status: DC | PRN
  Administered 2021-10-27: 650 mg via ORAL
  Filled 2021-10-27: qty 2

## 2021-10-27 MED ORDER — ALBUTEROL SULFATE (2.5 MG/3ML) 0.083% IN NEBU: 2.5000 mg | INHALATION_SOLUTION | RESPIRATORY_TRACT | Status: DC | PRN

## 2021-10-27 MED ORDER — METOPROLOL TARTRATE 12.5 MG HALF TABLET
12.5000 mg | ORAL_TABLET | Freq: Two times a day (BID) | ORAL | Status: DC
  Filled 2021-10-27: qty 1

## 2021-10-27 MED ORDER — SODIUM CHLORIDE 0.9 % IV SOLN: INTRAVENOUS | Status: DC

## 2021-10-27 MED ORDER — PROCHLORPERAZINE EDISYLATE 10 MG/2ML IJ SOLN
10.0000 mg | Freq: Four times a day (QID) | INTRAMUSCULAR | Status: DC | PRN
Start: 2021-10-27 — End: 2021-10-27

## 2021-10-27 MED ORDER — ALPRAZOLAM 0.25 MG PO TABS: 0.2500 mg | ORAL_TABLET | Freq: Two times a day (BID) | ORAL | Status: DC | PRN

## 2021-10-27 MED ORDER — INSULIN ASPART 100 UNIT/ML IJ SOLN
0.0000 [IU] | INTRAMUSCULAR | Status: DC
  Administered 2021-10-27: 1 [IU] via SUBCUTANEOUS

## 2021-10-27 MED ORDER — POLYETHYLENE GLYCOL 3350 17 G PO PACK: 17.0000 g | PACK | Freq: Every day | ORAL | Status: DC | PRN

## 2021-10-27 MED ORDER — CITALOPRAM HYDROBROMIDE 10 MG PO TABS
10.0000 mg | ORAL_TABLET | Freq: Every day | ORAL | Status: DC
  Administered 2021-10-27: 10 mg via ORAL
  Filled 2021-10-27: qty 1

## 2021-10-27 MED ORDER — INSULIN ASPART 100 UNIT/ML IJ SOLN: 0.0000 [IU] | Freq: Three times a day (TID) | INTRAMUSCULAR | Status: DC

## 2021-10-27 MED ORDER — MELATONIN 5 MG PO TABS: 5.0000 mg | ORAL_TABLET | Freq: Every evening | ORAL | Status: DC | PRN

## 2021-10-27 MED ORDER — PRAVASTATIN SODIUM 10 MG PO TABS
20.0000 mg | ORAL_TABLET | Freq: Every day | ORAL | Status: DC
  Administered 2021-10-27: 20 mg via ORAL
  Filled 2021-10-27: qty 2

## 2021-10-27 MED ORDER — APIXABAN 5 MG PO TABS
5.0000 mg | ORAL_TABLET | Freq: Two times a day (BID) | ORAL | Status: DC
  Administered 2021-10-27: 5 mg via ORAL
  Filled 2021-10-27: qty 1

## 2021-10-27 NOTE — Progress Notes (Signed)
  Echocardiogram 2D Echocardiogram has been performed.  Gerda Diss 10/27/2021, 11:48 AM

## 2021-10-27 NOTE — Progress Notes (Signed)
PT Cancellation Note  Patient Details Name: Leah Mata MRN: [id number] DOB: Apr 10, 1945   Cancelled Treatment:    Reason Eval/Treat Not Completed: PT screened, no needs identified, will sign off. Per OT pt at baseline with mobility and no PT needs.   Angelina Ok [facility] 10/27/2021, 11:33 AM Skip Mayer PT Acute Rehabilitation Services Office [phone]

## 2021-10-27 NOTE — Consult Note (Addendum)
Cardiology Consultation:   Patient ID: Leah Mata MRN: [id number]; DOB: 1946/02/03  Admit date: 10/26/2021 Date of Consult: 10/27/2021  PCP:  Corwin Levins, MD   [facility] Providers Cardiologist:  Jodelle Red, MD  12/10/2020  Patient Profile:   Leah Mata is a 76 y.o. female with a hx of paroxysmal atrial fib/flutter, HFpEF, hypertension, hyperlipidemia, AAA with ruptured abdominal aortic aneurysm with surgery on 04/29/2018. She had a >30 day admission, with discharge on 05/30/18. Near-syncope 08/2020 with fall and arm fx.  She is being seen 10/27/2021 for the evaluation of elevated troponin at the request of Dr Sharon Seller.  History of Present Illness:   [name] needs a hernia repair, cards pre op tele visit scheduled for 08/04.   Pt fell and was transported to [facility]. Troponin increased to 116, d-dimer was elevated. Pt tx to [facility] for admission, cards asked to see.   [name] is active around the house without chest pain or shortness of breath.  She is able to walk up 6 or 7 steps without stopping.  She can vacuum and sweep, but has trouble mopping because of back pain.  She is not limited by shortness of breath.  She has some chronic dyspnea on exertion that has not changed recently.  She had some lower extremity edema for the last 2 days, but admits that she ate out a couple of times over the weekend.  She has not been able to squat and stand up easily for a long time, says 2 years.  She says if she squats down, she will have to get on her hands and knees and pull herself up.  Yesterday, she felt she was in her usual state of health.  She was going out to the car and dropped her keys.  When she squatted down, she was not able to get back up.  She crawled on her hands and knees over to the car to try and get something to pull her self up, but was not able to. These sx are completely unlike the sx prior to her fall in 2022.  She called her nieces, and with one of them on  each side of her, she was able to get in the house.  The weakness in her legs gradually improved, but she was concerned about it.  Her son took her to [facility], and she was able to walk through the door.  She notes that in the last 3 years since her abdominal aneurysm surgery, she has gained about 40 pounds.  She has not smoked since her abdominal surgery, but feels like her breathing is not as good as it used to be.   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   INSOMNIA, HX OF 01/06/2007   Other and unspecified hyperlipidemia 08/22/2013   Urinary frequency 03/14/2009    Past Surgical History:  Procedure Laterality Date   ABDOMINAL AORTIC ANEURYSM REPAIR N/A 04/28/2018   Procedure: ANEURYSM ABDOMINAL AORTIC REPAIR WITH HEMASHIELD GOLD VASCULAR GRAFT 22X11 mm;  Surgeon: Larina Earthly, MD;  Location: [facility] OR;  Service: Vascular;  Laterality: N/A;   CARDIAC CATHETERIZATION  2003   negative   DILATION AND CURETTAGE OF UTERUS     TONSILLECTOMY     TUBAL LIGATION       Home Medications:  Prior to  Admission medications   Medication Sig Start Date End Date Taking? Authorizing Provider  ALPRAZolam Prudy Feeler) 0.25 MG tablet TAKE 1 TABLET BY MOUTH TWICE A DAY AS NEEDED FOR ANXIETY 10/28/20   Corwin Levins, MD  amLODipine (NORVASC) 10 MG tablet TAKE 1 TABLET EVERY DAY 03/27/21   Corwin Levins, MD  apixaban (ELIQUIS) 5 MG TABS tablet Take 1 tablet (5 mg total) by mouth 2 (two) times daily. 07/26/18   Jodelle Red, MD  citalopram (CELEXA) 10 MG tablet TAKE 1 TABLET EVERY DAY 04/27/21   Corwin Levins, MD  fluticasone [facility]) 50 MCG/ACT nasal spray Place 1 spray into both nostrils as needed for allergies or rhinitis.    [provider]  glycerin adult 2 g suppository  09/03/14   [provider]  levalbuterol  Pauline Aus) 1.25 MG/0.5ML nebulizer solution Take 1.25 mg by nebulization every 6 (six) hours as needed for wheezing or shortness of breath. 06/16/18   Corwin Levins, MD  lisinopril (ZESTRIL) 20 MG tablet TAKE 1 TABLET TWICE DAILY 03/27/21   Jodelle Red, MD  metoprolol tartrate (LOPRESSOR) 50 MG tablet TAKE 1 TABLET TWICE DAILY 06/26/21   Corwin Levins, MD  ondansetron (ZOFRAN) 4 MG tablet Take 1 tablet (4 mg total) by mouth every 8 (eight) hours as needed for up to 20 doses for nausea or vomiting. 08/30/20   Curatolo, Adam, DO  pravastatin (PRAVACHOL) 20 MG tablet TAKE 1 TABLET EVERY DAY 04/27/21   Corwin Levins, MD  triamcinolone cream (KENALOG) 0.1 % APPLY TOPICALLY TWICE DAILY 01/08/21   Corwin Levins, MD  Vitamin D, Ergocalciferol, (DRISDOL) 1.25 MG (50000 UT) CAPS capsule Take 1 capsule (50,000 Units total) by mouth every 7 (seven) days. 09/06/18   Corwin Levins, MD    Inpatient Medications: Scheduled Meds:  apixaban  5 mg Oral BID   citalopram  10 mg Oral Daily   insulin aspart  0-9 Units Subcutaneous Q4H   metoprolol tartrate  12.5 mg Oral BID   pravastatin  20 mg Oral Daily   Continuous Infusions:  sodium chloride 50 mL/hr at 10/27/21 0503   PRN Meds: acetaminophen, albuterol, ALPRAZolam, melatonin, polyethylene glycol, prochlorperazine  Allergies:    Allergies  Allergen Reactions   Hydrochlorothiazide Other (See Comments)    unknown    Social History:   Social History   Socioeconomic History   Marital status: Widowed    Spouse name: Not on file   Number of children: Not on file   Years of education: Not on file   Highest education level: Not on file  Occupational History   Not on file  Tobacco Use   Smoking status: Former    Types: Cigarettes   Smokeless tobacco: Never   Tobacco comments:    2 packs per week  Vaping Use   Vaping Use: Never used  Substance and Sexual Activity   Alcohol use: No   Drug use: No   Sexual activity: Not Currently    Birth  control/protection: Surgical    Comment: BTL  Other Topics Concern   Not on file  Social History Narrative   Not on file   Social Determinants of Health   Financial Resource Strain: Not on file  Food Insecurity: Not on file  Transportation Needs: Not on file  Physical Activity: Not on file  Stress: Not on file  Social Connections: Not on file  Intimate Partner Violence: Not on file    Family History:   Family  History  Problem Relation Age of Onset   Coronary artery disease Other        female 1st degree relative <60 and female 1st degree relative <50   Diabetes Father    Heart attack Father    Diabetes Mother    Hypertension Mother    Congestive Heart Failure Mother    Colon cancer Neg Hx    Breast cancer Neg Hx      ROS:  Please see the history of present illness.  All other ROS reviewed and negative.     Physical Exam/Data:   Vitals:   10/27/21 0444 10/27/21 0459 10/27/21 0743 10/27/21 0955  BP: 133/63  (!) 143/71 (!) 141/67  Pulse: 64  68 (!) 57  Resp: 16  18   Temp: 98.2 F (36.8 C)  98.3 F (36.8 C)   TempSrc: Oral  Oral   SpO2: 96%  98%   Weight:  86.5 kg    Height:        Intake/Output Summary (Last 24 hours) at 10/27/2021 1027 Last data filed at 10/26/2021 1830 Gross per 24 hour  Intake 800 ml  Output --  Net 800 ml      10/27/2021    4:59 AM 10/27/2021   12:22 AM 10/26/2021    2:20 PM  Last 3 Weights  Weight (lbs) 190 lb 12.8 oz 191 lb 6.4 oz 185 lb  Weight (kg) 86.546 kg 86.818 kg 83.915 kg     Body mass index is 30.8 kg/m.  General:  Well nourished, well developed, in no acute distress HEENT: normal Neck: no JVD seen, difficult to assess secondary to body habitus Vascular: No carotid bruits; Distal pulses 2+ bilaterally Cardiac:  normal S1, S2; RRR; no murmur  Lungs: Scattered rales bilaterally, no wheezing, rhonchi   Abd: soft, nontender, no hepatomegaly  Ext: no edema Musculoskeletal:  No deformities, BUE and BLE strength normal and  equal Skin: warm and dry  Neuro:  CNs 2-12 intact, no focal abnormalities noted Psych:  Normal affect   EKG:  The EKG was personally reviewed and demonstrates:  SR, HR 64, no acute ischemic changes, no pathologic Q waves Telemetry:  Telemetry was personally reviewed and demonstrates:  SR  Relevant CV Studies:  ECHO: ordered  ECHO: 05/03/2018  1. The left ventricle has hyperdynamic systolic function of >65%. The  cavity size is normal. There is no increased left ventricular wall  thickness. Echo evidence of impaired diastolic relaxation.   2. The right ventricle has normal systolic function. The cavity in normal  in size. There is no increase in right ventricular wall thickness.   3. The mitral valve is normal in structure.   4. The aortic valve has an indeterminant number of cusps.   5. The pulmonic valve is Not well seen. Pulmonic valve regurgitation was  not assessed by color flow Doppler.   6. There is mild dilatation of the aortic root.   7. The interatrial septum was not assessed.   8. Extremely limited due to poor sound wave transmission; definity used;  vigorous LV systolic function; mild diastolic dysfunction.   Laboratory Data:  High Sensitivity Troponin:   Recent Labs  Lab 10/26/21 1432 10/26/21 1710 10/26/21 2120 10/27/21 0423 10/27/21 0628  TROPONINIHS 37* 116* 234* 172* 137*     Chemistry Recent Labs  Lab 10/26/21 1432  NA 134*  K 4.3  CL 100  CO2 22  GLUCOSE 156*  BUN 19  CREATININE 1.14*  CALCIUM 10.3  GFRNONAA 51*  ANIONGAP 12    No results for input(s): "PROT", "ALBUMIN", "AST", "ALT", "ALKPHOS", "BILITOT" in the last 168 hours. Lipids No results for input(s): "CHOL", "TRIG", "HDL", "LABVLDL", "LDLCALC", "CHOLHDL" in the last 168 hours.  Hematology Recent Labs  Lab 10/26/21 1432  WBC 13.2*  RBC 4.15  HGB 12.9  HCT 38.9  MCV 93.7  MCH 31.1  MCHC 33.2  RDW 12.6  PLT 194   Thyroid No results for input(s): "TSH", "FREET4" in the last  168 hours.  BNP Recent Labs  Lab 10/26/21 1843  BNP 253.1*    DDimer  Recent Labs  Lab 10/26/21 1843  DDIMER 1.51*     Radiology/Studies:  CT Angio Chest/Abd/Pel for Dissection W and/or Wo Contrast  Result Date: 10/26/2021 CLINICAL DATA:  Aortic aneurysm, known or suspected. History of abdominal aortic aneurysm repair. Now with shortness of breath, elevated D-dimer, and elevated troponin. EXAM: CT ANGIOGRAPHY CHEST, ABDOMEN AND PELVIS TECHNIQUE: Noncontrast images of the chest were obtained. Multidetector CT imaging through the chest, abdomen and pelvis was performed using the standard protocol during bolus administration of intravenous contrast. Multiplanar reconstructed images and MIPs were obtained and reviewed to evaluate the vascular anatomy. RADIATION DOSE REDUCTION: This exam was performed according to the departmental dose-optimization program which includes automated exposure control, adjustment of the mA and/or kV according to patient size and/or use of iterative reconstruction technique. CONTRAST:  85mL OMNIPAQUE IOHEXOL 350 MG/ML SOLN, 34mL OMNIPAQUE IOHEXOL 350 MG/ML SOLN COMPARISON:  Chest radiograph 10/26/2021. CT abdomen and pelvis 09/21/2021 FINDINGS: CTA CHEST FINDINGS Cardiovascular: Noncontrast images of the chest demonstrate calcification of the aorta and coronary arteries. No evidence of intramural hematoma. Images obtained during arterial phase after contrast administration demonstrate satisfactory opacification of the pulmonary arteries to the segmental level. No evidence of pulmonary embolism. Normal heart size. No pericardial effusion. Normal caliber thoracic aorta. No aortic dissection. Calcification of the aorta and coronary arteries. Noncalcific plaque formation in the arch and descending aorta. Mediastinum/Nodes: Thyroid gland is unremarkable. Esophagus is decompressed. No significant lymphadenopathy. Lungs/Pleura: Lungs are clear. No pleural effusions. No  pneumothorax. Airways are patent. Musculoskeletal: No chest wall abnormality. No acute or significant osseous findings. Review of the MIP images confirms the above findings. CTA ABDOMEN AND PELVIS FINDINGS VASCULAR Aorta: Abdominal aortic aneurysm post aorta iliac graft repair. Graft is patent. Native aneurysm sac is compressed, measuring 2.8 cm diameter. No evidence of endoleak. No change in appearance since prior study. Celiac: Patent without evidence of aneurysm, dissection, vasculitis or significant stenosis. SMA: Patent without evidence of aneurysm, dissection, vasculitis or significant stenosis. Renals: Single right and duplicated left renal arteries are patent. No aneurysm or occlusion. Nephrograms are symmetrical. 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 Inflow: Patent without evidence of aneurysm, dissection, vasculitis or significant stenosis. Veins: No obvious venous abnormality within the limitations of this arterial phase study. Review of the MIP images confirms the above findings. NON-VASCULAR Hepatobiliary: Subcentimeter low-attenuation lesions in the liver are unchanged since previous study. Too small for characterization but most likely benign cysts or hemangiomas. Gallbladder and bile ducts are normal. Pancreas: Unremarkable. No pancreatic ductal dilatation or surrounding inflammatory changes. Spleen: Normal in size without focal abnormality. Adrenals/Urinary Tract: Adrenal glands are unremarkable. Kidneys are normal, without renal calculi, focal lesion, or hydronephrosis. Bladder is unremarkable. Stomach/Bowel: Stomach, small bowel, and colon are not abnormally distended. No wall thickening or inflammatory changes. Appendix is normal. There is a complex midline ventral abdominal wall hernia containing portions of  the stomach, colon, and small bowel. Similar appearance to the previous study. No proximal obstruction. Lymphatic: No significant lymphadenopathy. Reproductive: Uterus and  bilateral adnexa are unremarkable. Other: No free air or free fluid in the abdomen. Musculoskeletal: No acute or significant osseous findings. Review of the MIP images confirms the above findings. IMPRESSION: 1. Aortic atherosclerosis. No aortic dissection. Previous graft repair of a abdominal aortic aneurysm without complication or recurrence. 2. Lungs are clear. 3. Broad-based complex ventral abdominal wall hernia containing portions of stomach, colon, and small bowel. No proximal obstruction. Similar appearance to previous study. Electronically Signed   By: Burman NievesWilliam  Stevens M.D.   On: 10/26/2021 20:31   DG Chest 2 View  Result Date: 10/26/2021 CLINICAL DATA:  Weakness in lower extremities EXAM: CHEST - 2 VIEW COMPARISON:  08/30/2020 FINDINGS: Transverse diameter of Edwyna ShellHart is increased. Thoracic aorta is tortuous and ectatic. There are no signs of pulmonary edema or focal pulmonary consolidation. There is no pleural effusion or pneumothorax. IMPRESSION: No active cardiopulmonary disease. Electronically Signed   By: Ernie AvenaPalani  Rathinasamy M.D.   On: 10/26/2021 14:54     Assessment and Plan:   Elevated troponin, peak 234 - unclear cause - pt was in physical stress because of leg weakness, but did not have chest pain - ECG not acute - echo ordered, f/u on results - coronary calcifications on CT, MD advise if coronary CTA needed  2. Preoperative cardiovascular evaluation - RCRI is 1, which is 0.9% chance of perioperative major cardiac event - [facility] Activity Status Index is 18.95 for a functional capacity in METs of 5.07 - no further workup indicated based on her functional status.  - at acceptable risk for the planned surgery.    Risk Assessment/Risk Scores:       For questions or updates, please contact [facility] Please consult [email or web address] for contact info under    Signed, Theodore DemarkRhonda Barrett, PA-C  10/27/2021 10:27 AM  Patient seen, examined. Available data reviewed. Agree with findings,  assessment, and plan as outlined by Theodore Demarkhonda Barrett, PA-C.  The patient is independently interviewed and examined.  Her sons are at the bedside.  She is alert, oriented, in no distress.  HEENT is normal, neck with normal JVP, normal carotid upstrokes with no bruits.  Lungs are clear bilaterally.  Heart is regular rate and rhythm with no murmur or gallop.  Abdomen is soft and nontender with a large midline hernia, extremities have no edema, skin is warm and dry with no rash.  EKG is reviewed from this morning and shows normal sinus rhythm 64 bpm, within normal limits.  The patient's echocardiogram from this morning is reviewed and it shows normal LVEF of 60 to 65%, normal RV function, no significant valvular disease.  The patient's clinical history is repeated and she confirms that she dropped her keys yesterday, bent down to get them and eased herself to the ground.  She has weak legs and it has not been unusual for her to have difficulty getting up.  She was unable to get up and eventually worked her way back to her cell phone.  She called her niece who came to see her and ultimately took her to [facility] for evaluation.  She had no chest pain, chest pressure, shortness of breath, lightheadedness, or frank syncope.  Troponin was drawn and was shown to be elevated with a trend from 37 up to a maximum of 234, now trended down to 137.  Overnight observation was recommended because of  elevated high-sensitivity troponin.  The patient has no signs or symptoms of acute coronary syndrome.  Her EKG is normal.  She has no chest pain or pressure with any of her physical activities.  Her echocardiogram shows no regional wall motion abnormalities.  I do not think she needs any further cardiac evaluation at this time.  As outlined above, I think she is at acceptable risk of planned hernia repair and does not require further preoperative risk stratification.  Tonny Bollman, M.D. 10/27/2021 12:04 PM

## 2021-10-27 NOTE — H&P (Signed)
History and Physical  Leah Mata VQQ:[id number] DOB: December 06, 1945 DOA: 10/26/2021  Referring physician: Accepted by Dr. Sherrell Puller, hospitalist service. PCP: Corwin Levins, MD  Outpatient Specialists: Cardiology. Patient coming from: Home through to droppage ED.  Chief Complaint: Generalized weakness and fall.  HPI: Leah Mata is a 76 y.o. female with medical history significant for essential hypertension, COPD, AAA repair, paroxysmal A-fib on Eliquis, and more, who was in her usual state of health today until she bent over to pick something up and felt extremely weak and fell.  She continues to feel generally weak but no chest discomfort or shortness of breath at rest.  No acute changes on twelve-lead EKG.  Chest x-ray negative, CT angio chest/abdomen/pelvis nonacute.  Troponin uptrending from 37 to 116.  EDP discussed the case with cardiology who recommended admission, trend troponin, 2D echo.  Cardiology will consultation in the morning.  The patient received 1 L IV fluid bolus in the ED.  The patient was accepted by Dr. Antionette Char [facility], hospitalist service and transferred to [facility] telemetry cardiac unit.  At the time of this exam the patient denies any chest pain, chest discomfort, palpitations, or dyspnea.  ED Course: Tmax 98.5.  BP 141/70, pulse 58, respiration rate 18, O2 saturation 97% on room air.  Review of Systems: Review of systems as noted in the HPI. All other systems reviewed and are negative.   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   INSOMNIA, HX OF 01/06/2007   Other and unspecified hyperlipidemia 08/22/2013   Urinary frequency 03/14/2009   Past Surgical History:  Procedure Laterality Date   ABDOMINAL AORTIC ANEURYSM REPAIR N/A 04/28/2018   Procedure: ANEURYSM ABDOMINAL AORTIC REPAIR WITH  HEMASHIELD GOLD VASCULAR GRAFT 22X11 mm;  Surgeon: Larina Earthly, MD;  Location: [facility] OR;  Service: Vascular;  Laterality: N/A;   CARDIAC CATHETERIZATION  2003   negative   DILATION AND CURETTAGE OF UTERUS     TONSILLECTOMY     TUBAL LIGATION      Social History:  reports that she has quit smoking. Her smoking use included cigarettes. She has never used smokeless tobacco. She reports that she does not drink alcohol and does not use drugs.   Allergies  Allergen Reactions   Hydrochlorothiazide Other (See Comments)    unknown    Family History  Problem Relation Age of Onset   Coronary artery disease Other        female 1st degree relative <60 and female 1st degree relative <50   Diabetes Father    Heart attack Father    Diabetes Mother    Hypertension Mother    Congestive Heart Failure Mother    Colon cancer Neg Hx    Breast cancer Neg Hx       Prior to Admission medications   Medication Sig Start Date End Date Taking? Authorizing Provider  ALPRAZolam Prudy Feeler) 0.25 MG tablet TAKE 1 TABLET BY MOUTH TWICE A DAY AS NEEDED FOR ANXIETY 10/28/20   Corwin Levins, MD  amLODipine (NORVASC) 10 MG tablet TAKE 1 TABLET EVERY DAY 03/27/21   Corwin Levins, MD  apixaban (ELIQUIS) 5 MG TABS tablet Take 1 tablet (5 mg total) by mouth 2 (two) times daily. 07/26/18   Jodelle Red, MD  citalopram (CELEXA) 10 MG tablet TAKE 1  TABLET EVERY DAY 04/27/21   Corwin Levins, MD  fluticasone [facility]) 50 MCG/ACT nasal spray Place 1 spray into both nostrils as needed for allergies or rhinitis.    [provider]  glycerin adult 2 g suppository  09/03/14   [provider]  levalbuterol Pauline Aus) 1.25 MG/0.5ML nebulizer solution Take 1.25 mg by nebulization every 6 (six) hours as needed for wheezing or shortness of breath. 06/16/18   Corwin Levins, MD  lisinopril (ZESTRIL) 20 MG tablet TAKE 1 TABLET TWICE DAILY 03/27/21   Jodelle Red, MD  metoprolol tartrate (LOPRESSOR) 50 MG tablet  TAKE 1 TABLET TWICE DAILY 06/26/21   Corwin Levins, MD  ondansetron (ZOFRAN) 4 MG tablet Take 1 tablet (4 mg total) by mouth every 8 (eight) hours as needed for up to 20 doses for nausea or vomiting. 08/30/20   Curatolo, Adam, DO  pravastatin (PRAVACHOL) 20 MG tablet TAKE 1 TABLET EVERY DAY 04/27/21   Corwin Levins, MD  triamcinolone cream (KENALOG) 0.1 % APPLY TOPICALLY TWICE DAILY 01/08/21   Corwin Levins, MD  Vitamin D, Ergocalciferol, (DRISDOL) 1.25 MG (50000 UT) CAPS capsule Take 1 capsule (50,000 Units total) by mouth every 7 (seven) days. 09/06/18   Corwin Levins, MD    Physical Exam: BP (!) 141/70 (BP Location: Left Arm)   Pulse (!) 58   Temp 98 F (36.7 C) (Oral)   Resp 18   Ht 5\' 6"  (1.676 m)   Wt 86.8 kg   LMP 03/29/1993 (Approximate)   SpO2 97%   BMI 30.89 kg/m   General: 76 y.o. year-old female well developed well nourished in no acute distress.  Alert and oriented x3. Cardiovascular: Regular rate and rhythm with no rubs or gallops.  No thyromegaly or JVD noted.  No lower extremity edema. 2/4 pulses in all 4 extremities. Respiratory: Clear to auscultation with no wheezes or rales. Good inspiratory effort. Abdomen: Soft nontender nondistended with normal bowel sounds x4 quadrants. Muskuloskeletal: No cyanosis, clubbing or edema noted bilaterally Neuro: CN II-XII intact, strength, sensation, reflexes Skin: No ulcerative lesions noted or rashes Psychiatry: Judgement and insight appear normal. Mood is appropriate for condition and setting          Labs on Admission:  Basic Metabolic Panel: Recent Labs  Lab 10/26/21 1432  NA 134*  K 4.3  CL 100  CO2 22  GLUCOSE 156*  BUN 19  CREATININE 1.14*  CALCIUM 10.3   Liver Function Tests: No results for input(s): "AST", "ALT", "ALKPHOS", "BILITOT", "PROT", "ALBUMIN" in the last 168 hours. No results for input(s): "LIPASE", "AMYLASE" in the last 168 hours. No results for input(s): "AMMONIA" in the last 168 hours. CBC: Recent  Labs  Lab 10/26/21 1432  WBC 13.2*  HGB 12.9  HCT 38.9  MCV 93.7  PLT 194   Cardiac Enzymes: No results for input(s): "CKTOTAL", "CKMB", "CKMBINDEX", "TROPONINI" in the last 168 hours.  BNP (last 3 results) Recent Labs    10/26/21 1843  BNP 253.1*    ProBNP (last 3 results) No results for input(s): "PROBNP" in the last 8760 hours.  CBG: No results for input(s): "GLUCAP" in the last 168 hours.  Radiological Exams on Admission: CT Angio Chest/Abd/Pel for Dissection W and/or Wo Contrast  Result Date: 10/26/2021 CLINICAL DATA:  Aortic aneurysm, known or suspected. History of abdominal aortic aneurysm repair. Now with shortness of breath, elevated D-dimer, and elevated troponin. EXAM: CT ANGIOGRAPHY CHEST, ABDOMEN AND PELVIS TECHNIQUE: Noncontrast images of the  chest were obtained. Multidetector CT imaging through the chest, abdomen and pelvis was performed using the standard protocol during bolus administration of intravenous contrast. Multiplanar reconstructed images and MIPs were obtained and reviewed to evaluate the vascular anatomy. RADIATION DOSE REDUCTION: This exam was performed according to the departmental dose-optimization program which includes automated exposure control, adjustment of the mA and/or kV according to patient size and/or use of iterative reconstruction technique. CONTRAST:  35mL OMNIPAQUE IOHEXOL 350 MG/ML SOLN, 26mL OMNIPAQUE IOHEXOL 350 MG/ML SOLN COMPARISON:  Chest radiograph 10/26/2021. CT abdomen and pelvis 09/21/2021 FINDINGS: CTA CHEST FINDINGS Cardiovascular: Noncontrast images of the chest demonstrate calcification of the aorta and coronary arteries. No evidence of intramural hematoma. Images obtained during arterial phase after contrast administration demonstrate satisfactory opacification of the pulmonary arteries to the segmental level. No evidence of pulmonary embolism. Normal heart size. No pericardial effusion. Normal caliber thoracic aorta. No aortic  dissection. Calcification of the aorta and coronary arteries. Noncalcific plaque formation in the arch and descending aorta. Mediastinum/Nodes: Thyroid gland is unremarkable. Esophagus is decompressed. No significant lymphadenopathy. Lungs/Pleura: Lungs are clear. No pleural effusions. No pneumothorax. Airways are patent. Musculoskeletal: No chest wall abnormality. No acute or significant osseous findings. Review of the MIP images confirms the above findings. CTA ABDOMEN AND PELVIS FINDINGS VASCULAR Aorta: Abdominal aortic aneurysm post aorta iliac graft repair. Graft is patent. Native aneurysm sac is compressed, measuring 2.8 cm diameter. No evidence of endoleak. No change in appearance since prior study. Celiac: Patent without evidence of aneurysm, dissection, vasculitis or significant stenosis. SMA: Patent without evidence of aneurysm, dissection, vasculitis or significant stenosis. Renals: Single right and duplicated left renal arteries are patent. No aneurysm or occlusion. Nephrograms are symmetrical. 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 Inflow: Patent without evidence of aneurysm, dissection, vasculitis or significant stenosis. Veins: No obvious venous abnormality within the limitations of this arterial phase study. Review of the MIP images confirms the above findings. NON-VASCULAR Hepatobiliary: Subcentimeter low-attenuation lesions in the liver are unchanged since previous study. Too small for characterization but most likely benign cysts or hemangiomas. Gallbladder and bile ducts are normal. Pancreas: Unremarkable. No pancreatic ductal dilatation or surrounding inflammatory changes. Spleen: Normal in size without focal abnormality. Adrenals/Urinary Tract: Adrenal glands are unremarkable. Kidneys are normal, without renal calculi, focal lesion, or hydronephrosis. Bladder is unremarkable. Stomach/Bowel: Stomach, small bowel, and colon are not abnormally distended. No wall thickening or  inflammatory changes. Appendix is normal. There is a complex midline ventral abdominal wall hernia containing portions of the stomach, colon, and small bowel. Similar appearance to the previous study. No proximal obstruction. Lymphatic: No significant lymphadenopathy. Reproductive: Uterus and bilateral adnexa are unremarkable. Other: No free air or free fluid in the abdomen. Musculoskeletal: No acute or significant osseous findings. Review of the MIP images confirms the above findings. IMPRESSION: 1. Aortic atherosclerosis. No aortic dissection. Previous graft repair of a abdominal aortic aneurysm without complication or recurrence. 2. Lungs are clear. 3. Broad-based complex ventral abdominal wall hernia containing portions of stomach, colon, and small bowel. No proximal obstruction. Similar appearance to previous study. Electronically Signed   By: Burman Nieves M.D.   On: 10/26/2021 20:31   DG Chest 2 View  Result Date: 10/26/2021 CLINICAL DATA:  Weakness in lower extremities EXAM: CHEST - 2 VIEW COMPARISON:  08/30/2020 FINDINGS: Transverse diameter of Edwyna Shell is increased. Thoracic aorta is tortuous and ectatic. There are no signs of pulmonary edema or focal pulmonary consolidation. There is no pleural effusion or pneumothorax.  IMPRESSION: No active cardiopulmonary disease. Electronically Signed   By: Ernie Avena M.D.   On: 10/26/2021 14:54    EKG: I independently viewed the EKG done and my findings are as followed: Sinus rhythm rate of 70.  Nonspecific ST-T changes.  QTc 467.  Assessment/Plan Present on Admission:  Elevated troponin  Principal Problem:   Elevated troponin  Generalized weakness and a fall Unclear etiology Nonfocal exam Elevated troponin Denies any anginal symptoms Obtain orthostatic static vital signs PT OT evaluation Fall precautions  Elevated troponin, suspect demand ischemia High-sensitivity troponin peaked at 234 No evidence of acute ischemia on 12-lead  EKG Follow 2D echo  Paroxysmal A-fib on Eliquis Restarted home Eliquis Rate controlled on home metoprolol tartrate  Chronic anxiety/depression Resume home regimen  Hyperlipidemia Resume home pravastatin  Type 2 diabetes with hyperglycemia Obtain hemoglobin A1c Insulin sliding scale every 4 hours while NPO    DVT prophylaxis: Home Eliquis  Code Status: Full code  Family Communication: None at bedside  Disposition Plan: Admitted by Dr. Antionette Char [facility], hospitalist service to telemetry cardiac unit  Consults called: Cardiology consulted by EDP  Admission status: Observation status   Status is: Observation    Darlin Drop MD [facility] Pager [phone]  If 7PM-7AM, please contact night-coverage [email or web address] Password [email or web address]  10/27/2021, 4:09 AM

## 2021-10-27 NOTE — Discharge Summary (Signed)
DISCHARGE SUMMARY  Leah Mata  MR#: [id number]  DOB:1945/11/24  Date of Admission: 10/26/2021 Date of Discharge: 10/27/2021  Attending Physician:Liberato Stansbery Silvestre Gunner, MD  Patient's PYK:DXIP, Len Blalock, MD  Consults: Cardiology   Disposition: D/C home   Follow-up Appts:  Follow-up Information     Corwin Levins, MD Follow up.   Specialties: Internal Medicine, Radiology Why: As needed Contact information: [address] [place] [address] [phone]                 Discharge Diagnoses: Elevated troponin Generalized weakness with a fall PAF on chronic Eliquis Chronic anxiety/depression HLD DM2 with hyperglycemia  Initial presentation: 76 year old with a history of HTN, COPD, AAA status post repair, and PAF on Eliquis who presented to the ER with the acute onset of severe generalized weakness.  She was incidentally found to have a modestly elevated troponin in the emergency room.  Hospital Course: Patient was placed in observation after she presented to the ER with generalized weakness and was incidentally found to have a modestly elevated troponin.  Further history suggests that her weakness was not a new issue, and it had improved markedly by the time she presented to the ER.  She denies any chest pain or chest pressure.  Her troponin was trended and quickly declined.  A TTE was accomplished and revealed no acute findings.  Cardiology did not feel that any further inpatient work-up was indicated, and the medical team agreed.  PT and OT evaluated the patient and there was no evidence that she required any kind of ongoing therapies.  Allergies as of 10/27/2021       Reactions   Hydrochlorothiazide Other (See Comments)   unknown        Medication List     TAKE these medications    acetaminophen 500 MG tablet Commonly known as: TYLENOL Take 1,000 mg by mouth every 6 (six) hours as needed for mild pain.   ALPRAZolam 0.25 MG tablet Commonly known as:  XANAX TAKE 1 TABLET BY MOUTH TWICE A DAY AS NEEDED FOR ANXIETY   amLODipine 10 MG tablet Commonly known as: NORVASC TAKE 1 TABLET EVERY DAY   apixaban 5 MG Tabs tablet Commonly known as: ELIQUIS Take 1 tablet (5 mg total) by mouth 2 (two) times daily.   cholecalciferol 25 MCG (1000 UNIT) tablet Commonly known as: VITAMIN D3 Take 1,000 Units by mouth daily.   citalopram 10 MG tablet Commonly known as: CELEXA TAKE 1 TABLET EVERY DAY   fluticasone 50 MCG/ACT nasal spray Commonly known as: FLONASE Place 1 spray into both nostrils as needed for allergies or rhinitis.   levalbuterol 1.25 MG/0.5ML nebulizer solution Commonly known as: XOPENEX Take 1.25 mg by nebulization every 6 (six) hours as needed for wheezing or shortness of breath.   lisinopril 20 MG tablet Commonly known as: ZESTRIL TAKE 1 TABLET TWICE DAILY   metoprolol tartrate 50 MG tablet Commonly known as: LOPRESSOR TAKE 1 TABLET TWICE DAILY   pravastatin 20 MG tablet Commonly known as: PRAVACHOL TAKE 1 TABLET EVERY DAY        Day of Discharge BP (!) 141/67 (BP Location: Left Arm)   Pulse (!) 57   Temp 98.3 F (36.8 C) (Oral)   Resp 18   Ht 5\' 6"  (1.676 m)   Wt 86.5 kg   LMP 03/29/1993 (Approximate)   SpO2 98%   BMI 30.80 kg/m   Physical Exam: General: No acute respiratory distress Lungs: Clear to  auscultation bilaterally without wheezes or crackles Cardiovascular: Regular rate and rhythm without murmur gallop or rub normal S1 and S2 Abdomen: Nontender, nondistended, soft, bowel sounds positive, no rebound, no ascites, no appreciable mass Extremities: No significant cyanosis, clubbing, or edema bilateral lower extremities  Basic Metabolic Panel: Recent Labs  Lab 10/26/21 1432  NA 134*  K 4.3  CL 100  CO2 22  GLUCOSE 156*  BUN 19  CREATININE 1.14*  CALCIUM 10.3   CBC: Recent Labs  Lab 10/26/21 1432  WBC 13.2*  HGB 12.9  HCT 38.9  MCV 93.7  PLT 194    Time spent in discharge  (includes decision making & examination of pt): < 30 minutes  10/27/2021, 12:46 PM   Lonia Blood, MD [facility] Office  [phone]

## 2021-10-27 NOTE — Evaluation (Signed)
Occupational Therapy Evaluation and Discharge Patient Details Name: Leah Mata MRN: [id number] DOB: October 12, 1945 Today's Date: 10/27/2021   History of Present Illness Leah Mata is a 76 y.o. female admitted under observation 10/26/21 she bent over to pick something up and felt extremely weak and fell.  She continues to feel generally weak, troponin up trending. PMH includes HTN, COPD, AAA repair, paroxysmal A-fib on Eliquis, Cardiac catheterization (2003); and Abdominal aortic aneurysm repair (04/28/2018).   Clinical Impression   Pt is typically mod I in home environment for ADL and mobility without DME. Today she presents at baseline. Negative for orthostatics (see vitals flowsheet) able to demonstrate UB and LB ADL without assist. Walking in room and hallway without DME, and then agreeable to push IV pole - able to manage IV pole and navigate around obstacles. Pt did get DOE 2/4 with VSS (SPO2 >92% and HR 73-87). Pt and family (son and grandson) with no further questions or concerns. OT education complete, OT will sign off at this time.   Of note: Pt said that she does suffer from back pain which interferes with her ability to perform IADL - I recommended that she get a referral from her PCP for outpatient PT to address this.      Recommendations for follow up therapy are one component of a multi-disciplinary discharge planning process, led by the attending physician.  Recommendations may be updated based on patient status, additional functional criteria and insurance authorization.   Follow Up Recommendations  No OT follow up    Assistance Recommended at Discharge PRN  Patient can return home with the following Assistance with cooking/housework    Functional Status Assessment  Patient has had a recent decline in their functional status and demonstrates the ability to make significant improvements in function in a reasonable and predictable amount of time.  Equipment Recommendations  None  recommended by OT (Pt has appropriate DME)    Recommendations for Other Services       Precautions / Restrictions Precautions Precautions: None Restrictions Weight Bearing Restrictions: No      Mobility Bed Mobility Overal bed mobility: Modified Independent             General bed mobility comments: increased time and effort, no physical assist needed    Transfers Overall transfer level: Modified independent Equipment used: None               General transfer comment: steady on feet      Balance Overall balance assessment: Mild deficits observed, not formally tested                                         ADL either performed or assessed with clinical judgement   ADL Overall ADL's : At baseline                                       General ADL Comments: able to demonstrate UB and LB ADL, transfers and ambulation. She states that she used to perform all housework in one day and now has to spread it out due to chronic back pain. Recommended she speak to her PCP about outpatient PT once her next sx is done     Vision Ability to See in Adequate Light: 0 Adequate Patient Visual Report:  No change from baseline Vision Assessment?: No apparent visual deficits     Perception     Praxis      Pertinent Vitals/Pain Pain Assessment Pain Assessment: Faces Faces Pain Scale: Hurts a little bit Pain Location: knees from crawling on gravel Pain Descriptors / Indicators: Sore, Aching Pain Intervention(s): Monitored during session, Repositioned     Hand Dominance Right   Extremity/Trunk Assessment Upper Extremity Assessment Upper Extremity Assessment: Overall WFL for tasks assessed   Lower Extremity Assessment Lower Extremity Assessment: Overall WFL for tasks assessed   Cervical / Trunk Assessment Cervical / Trunk Assessment: Other exceptions Cervical / Trunk Exceptions: chronic back pain   Communication  Communication Communication: No difficulties   Cognition Arousal/Alertness: Awake/alert Behavior During Therapy: WFL for tasks assessed/performed Overall Cognitive Status: Within Functional Limits for tasks assessed                                       General Comments  negative for orthostatics (see vitals flow sheet); HR from 73 to 87 with hallway ambulation    Exercises     Shoulder Instructions      Home Living Family/patient expects to be discharged to:: Private residence Living Arrangements: Children Available Help at Discharge: Family;Available 24 hours/day Type of Home: Mobile home Home Access: Stairs to enter Entrance Stairs-Number of Steps: 5 Entrance Stairs-Rails: Right;Left;Can reach both Home Layout: One level     Bathroom Shower/Tub: Chief Strategy Officer:  (comfort)     Home Equipment: Teacher, English as a foreign language (2 wheels)          Prior Functioning/Environment Prior Level of Function : Independent/Modified Independent             Mobility Comments: has a RW from previous sx does not use ADLs Comments: does her own ADL/IADL        OT Problem List: Decreased activity tolerance;Obesity;Pain      OT Treatment/Interventions:      OT Goals(Current goals can be found in the care plan section) Acute Rehab OT Goals Patient Stated Goal: Get home and get ready for next sx OT Goal Formulation: With patient/family Time For Goal Achievement: 11/10/21 Potential to Achieve Goals: Good  OT Frequency:      Co-evaluation              AM-PAC OT "6 Clicks" Daily Activity     Outcome Measure Help from another person eating meals?: None Help from another person taking care of personal grooming?: None Help from another person toileting, which includes using toliet, bedpan, or urinal?: None Help from another person bathing (including washing, rinsing, drying)?: A Little Help from another person to put on and taking off  regular upper body clothing?: None Help from another person to put on and taking off regular lower body clothing?: None 6 Click Score: 23   End of Session Equipment Utilized During Treatment: Gait belt Nurse Communication: Mobility status  Activity Tolerance: Patient tolerated treatment well Patient left: in chair;with call bell/phone within reach;with nursing/sitter in room  OT Visit Diagnosis: History of falling (Z91.81)                Time: 2353-6144 OT Time Calculation (min): 26 min Charges:  OT General Charges $OT Visit: 1 Visit OT Evaluation $OT Eval Low Complexity: 1 Low  Nyoka Cowden OTR/L Acute Rehabilitation Services Office: [phone]  Emelda Fear 10/27/2021, 12:32 PM

## 2021-10-30 ENCOUNTER — Telehealth: Payer: Medicare HMO

## 2021-10-30 NOTE — Telephone Encounter (Signed)
I am going to send this to pre op for review. Please see notes from the pt that she has been cleared while she was in the hospital.

## 2021-10-30 NOTE — Telephone Encounter (Signed)
Pt called, she said, she was in the hospital this weekend and was advised by the doctor to cancel her tele visit since the doctor there cleared her for her procedure.

## 2021-10-30 NOTE — Telephone Encounter (Signed)
   Patient Name: Leah Mata  DOB: 06/25/1945 MRN: [id number]  Primary Cardiologist: Jodelle Red, MD  Chart reviewed as part of pre-operative protocol coverage. Given past medical history and time since last visit, based on ACC/AHA guidelines, Leah Mata would be at acceptable risk for the planned procedure without further cardiovascular testing.   The patient was advised that if she develops new symptoms prior to surgery to contact our office to arrange for a follow-up visit, and she verbalized understanding.  I will route this recommendation to the requesting party via Epic fax function and remove from pre-op pool.  Per office protocol, patient can hold Eliquis for 2 days prior to procedure.   Please call with questions.  Napoleon Form, Leodis Rains, NP 10/30/2021, 10:55 AM

## 2021-12-22 ENCOUNTER — Other Ambulatory Visit: Payer: Self-pay | Admitting: Internal Medicine

## 2021-12-31 ENCOUNTER — Ambulatory Visit: Payer: Self-pay | Admitting: Surgery

## 2022-01-04 NOTE — Patient Instructions (Addendum)
DUE TO COVID-19 ONLY TWO VISITORS  (aged 74 and older)  ARE ALLOWED TO COME WITH YOU AND STAY IN THE WAITING ROOM ONLY DURING PRE OP AND PROCEDURE.   **NO VISITORS ARE ALLOWED IN THE SHORT STAY AREA OR RECOVERY ROOM!!**  IF YOU WILL BE ADMITTED INTO THE HOSPITAL YOU ARE ALLOWED ONLY FOUR SUPPORT PEOPLE DURING VISITATION HOURS ONLY (7 AM -8PM)   The support person(s) must pass our screening, gel in and out, and wear a mask at all times, including in the patient's room. Patients must also wear a mask when staff or their support person are in the room. Visitors GUEST BADGE MUST BE WORN VISIBLY  One adult visitor may remain with you overnight and MUST be in the room by 8 P.M.     Your procedure is scheduled on: 01/22/22   Report to [facility] Main Entrance    Report to admitting at  5:30 AM   Call this number if you have problems the morning of surgery [phone]   Do not eat food :After Midnight.   After Midnight you may have the following liquids until __4:15____ AM/  DAY OF SURGERY  Water Black Coffee (sugar ok, NO MILK/CREAM OR CREAMERS)  Tea (sugar ok, NO MILK/CREAM OR CREAMERS) regular and decaf                             Plain Jell-O (NO RED)                                           Fruit ices (not with fruit pulp, NO RED)                                     Popsicles (NO RED)                                                                  Juice: apple, WHITE grape, WHITE cranberry Sports drinks like Gatorade (NO RED)                If you have questions, please contact your surgeon's office.       Oral Hygiene is also important to reduce your risk of infection.                                    Remember - BRUSH YOUR TEETH THE MORNING OF SURGERY WITH YOUR REGULAR TOOTHPASTE   Do NOT smoke after Midnight   Take these medicines the morning of surgery with A SIP OF WATER: Citalopram- Celexa  Pravastatin- Pravachol                                                                                                                            Metoprolol- Lopressor                                                                                                                            Amlodipine- Norvasc   Before surgery.Stop taking __Eliquis_________on _10/24_______ Contact your Cardiologist for instructions on Anticoagulant Therapy prior to surgery.                                 You may not have any metal on your body including hair pins, jewelry, and body piercing             Do not wear make-up, lotions, powders, perfumes/cologne, or deodorant  Do not wear nail polish including gel and S&S, artificial/acrylic nails, or any other type of covering on natural nails including finger and toenails. If you have artificial nails, gel coating, etc. that needs to be removed by a nail salon please have this removed prior to surgery or surgery may need to be canceled/ delayed if the surgeon/ anesthesia feels like they are unable to be safely monitored.   Do not shave  48 hours prior to surgery.     Do not bring valuables to the hospital. [place].   Contacts, dentures or bridgework may not be worn into surgery.   Bring small overnight bag day of surgery.   DO NOT [place]. PHARMACY WILL DISPENSE MEDICATIONS LISTED ON YOUR MEDICATION LIST TO YOU DURING YOUR ADMISSION [place]!       Special Instructions: Bring a copy of your healthcare power of attorney and living will documents  the day of surgery if you haven't scanned them before.              Please read over the following fact sheets you were given: IF YOU HAVE QUESTIONS ABOUT YOUR PRE-OP INSTRUCTIONS PLEASE CALL [phone]     [facility] - Preparing for Surgery Before surgery, you  can play an important role.  Because skin is not sterile, your skin needs to be as free of  germs as possible.  You can reduce the number of germs on your skin by washing with CHG (chlorahexidine gluconate) soap before surgery.  CHG is an antiseptic cleaner which kills germs and bonds with the skin to continue killing germs even after washing. Please DO NOT use if you have an allergy to CHG or antibacterial soaps.  If your skin becomes reddened/irritated stop using the CHG and inform your nurse when you arrive at Short Stay. Do not shave (including legs and underarms) for at least 48 hours prior to the first CHG shower.  . Please follow these instructions carefully:  1.  Shower with CHG Soap the night before surgery and the  morning of Surgery.  2.  If you choose to wash your hair, wash your hair first as usual with your  normal  shampoo.  3.  After you shampoo, rinse your hair and body thoroughly to remove the  shampoo.                            4.  Use CHG as you would any other liquid soap.  You can apply chg directly  to the skin and wash                       Gently with a scrungie or clean washcloth.  5.  Apply the CHG Soap to your body ONLY FROM THE NECK DOWN.   Do not use on face/ open                           Wound or open sores. Avoid contact with eyes, ears mouth and genitals (private parts).                       Wash face,  Genitals (private parts) with your normal soap.             6.  Wash thoroughly, paying special attention to the area where your surgery  will be performed.  7.  Thoroughly rinse your body with warm water from the neck down.  8.  DO NOT shower/wash with your normal soap after using and rinsing off  the CHG Soap.                9.  Pat yourself dry with a clean towel.            10.  Wear clean pajamas.            11.  Place clean sheets on your bed the night of your first shower and do not  sleep with pets. Day of Surgery : Do not apply any lotions/deodorants the  morning of surgery.  Please wear clean clothes to the hospital/surgery center.  FAILURE TO FOLLOW THESE INSTRUCTIONS MAY RESULT IN THE CANCELLATION OF YOUR SURGERY   ________________________________________________________________________

## 2022-01-08 ENCOUNTER — Encounter (HOSPITAL_COMMUNITY)
Admission: RE | Admit: 2022-01-08 | Discharge: 2022-01-08 | Disposition: A | Discharge status: Home / Self Care | Payer: Medicare HMO | Source: Ambulatory Visit | Attending: Surgery | Admitting: Surgery

## 2022-01-08 ENCOUNTER — Other Ambulatory Visit: Payer: Self-pay

## 2022-01-08 ENCOUNTER — Encounter (HOSPITAL_COMMUNITY): Payer: Self-pay

## 2022-01-08 DIAGNOSIS — R7303 Prediabetes: Secondary | ICD-10-CM | POA: Diagnosis not present

## 2022-01-08 DIAGNOSIS — I10 Essential (primary) hypertension: Secondary | ICD-10-CM | POA: Insufficient documentation

## 2022-01-08 DIAGNOSIS — Z01812 Encounter for preprocedural laboratory examination: Secondary | ICD-10-CM | POA: Insufficient documentation

## 2022-01-08 HISTORY — DX: Prediabetes: R73.03

## 2022-01-08 HISTORY — DX: Dyspnea, unspecified: R06.00

## 2022-01-08 LAB — CBC
HCT: 41.1 % (ref 36.0–46.0)
Hemoglobin: 13.4 g/dL (ref 12.0–15.0)
MCH: 31 pg (ref 26.0–34.0)
MCHC: 32.6 g/dL (ref 30.0–36.0)
MCV: 95.1 fL (ref 80.0–100.0)
Platelets: 220 10*3/uL (ref 150–400)
RBC: 4.32 MIL/uL (ref 3.87–5.11)
RDW: 12.2 % (ref 11.5–15.5)
WBC: 9 10*3/uL (ref 4.0–10.5)
nRBC: 0 % (ref 0.0–0.2)

## 2022-01-08 LAB — GLUCOSE, CAPILLARY: Glucose-Capillary: 119 mg/dL — ABNORMAL HIGH (ref 70–99)

## 2022-01-08 LAB — HEMOGLOBIN A1C
Hgb A1c MFr Bld: 6.1 % — ABNORMAL HIGH (ref 4.8–5.6)
Mean Plasma Glucose: 128.37 mg/dL

## 2022-01-08 NOTE — Progress Notes (Signed)
Anesthesia note:  Bowel prep reminder:NA  PCP - Dr. Marshall Cork Cardiologist -Dr. Venida Jarvis Other-   Chest x-ray - 10/26/21-epic EKG - 10/28/21-epic Stress Test - no ECHO - 10/27/21-epic Cardiac Cath - 2003  Pacemaker/ICD device last checked:NA  Sleep Study - no CPAP - no  Pt is pre diabetic-yes- CBG at PAT was 119 Fasting Blood Sugar -  Checks Blood Sugar _____  Blood Thinner:Eliquis- Dr. Harrell Gave- pt will call to verify Blood Thinner Instructions:hold 3 days Aspirin Instructions: Last Dose:10/24  Anesthesia review: yes  Patient denies shortness of breath, fever, cough and chest pain at PAT appointment Pt has COPD. She gets SOB on one flight of stairs.   Patient verbalized understanding of instructions that were given to them at the PAT appointment. Patient was also instructed that they will need to review over the PAT instructions again at home before surgery. yes

## 2022-01-12 NOTE — Progress Notes (Signed)
Anesthesia Chart Review   Case: [id number] Date/Time: 01/22/22 0700   Procedures:      XI ROBOTIC ASSISTED INCISIONAL HERNIA REPAIR WITH MESH     XI ROBOTIC ASSISTED BILATERAL INGUINAL  HERNIA   Anesthesia type: General   Pre-op diagnosis: INCISIONAL HERNIA 10cm x 16cm BILATERAL INGUINAL HERNIAS   Location: [facility] ROOM 02 / [facility]   Surgeons: Stechschulte, Nickola Major, MD       DISCUSSION:76 y.o. former smoker with h/o HTN, COPD, AAA status post repair 04/29/2018, PAF on Eliquis, incisional hernia scheduled for above procedure 01/22/2022 with Dr. Louanna Raw.   Per cardiology preoperative evaluation 10/30/2021, "Chart reviewed as part of pre-operative protocol coverage. Given past medical history and time since last visit, based on ACC/AHA guidelines, Leah Mata would be at acceptable risk for the planned procedure without further cardiovascular testing.    The patient was advised that if she develops new symptoms prior to surgery to contact our office to arrange for a follow-up visit, and she verbalized understanding.   I will route this recommendation to the requesting party via Epic fax function and remove from pre-op pool.   Per office protocol, patient can hold Eliquis for 2 days prior to procedure. "  Anticipate pt can proceed with planned procedure barring acute status change.   VS: BP 139/63   Pulse (!) 50   Temp 36.4 C (Oral)   Resp (!) 26   Ht 5\' 6"  (1.676 m)   Wt 84.4 kg   LMP 03/29/1993 (Approximate)   SpO2 95%   BMI 30.02 kg/m   PROVIDERS: Biagio Borg, MD is PCP  Primary Cardiologist: Buford Dresser, MD LABS: Labs reviewed: Acceptable for surgery. (all labs ordered are listed, but only abnormal results are displayed)  Labs Reviewed  HEMOGLOBIN A1C - Abnormal; Notable for the following components:      Result Value   Hgb A1c MFr Bld 6.1 (*)    All other components within normal limits  GLUCOSE, CAPILLARY - Abnormal; Notable for the following  components:   Glucose-Capillary 119 (*)    All other components within normal limits  CBC     IMAGES: CT Angio 10/26/2021 IMPRESSION: 1. Aortic atherosclerosis. No aortic dissection. Previous graft repair of a abdominal aortic aneurysm without complication or recurrence. 2. Lungs are clear. 3. Broad-based complex ventral abdominal wall hernia containing portions of stomach, colon, and small bowel. No proximal obstruction. Similar appearance to previous study.  EKG:   CV: Echo 10/27/2021 1. Left ventricular ejection fraction, by estimation, is 60 to 65%. The  left ventricle has normal function. The left ventricle has no regional  wall motion abnormalities. Left ventricular diastolic parameters are  indeterminate.   2. Right ventricular systolic function is normal. The right ventricular  size is normal. Tricuspid regurgitation signal is inadequate for assessing  PA pressure.   3. There is no evidence of cardiac tamponade.   4. The mitral valve is normal in structure. No evidence of mitral valve  regurgitation. No evidence of mitral stenosis.   5. The aortic valve was not well visualized. Aortic valve regurgitation  is not visualized. No aortic stenosis is present.   6. The inferior vena cava is normal in size with greater than 50%  respiratory variability, suggesting right atrial pressure of 3 mmHg.  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  Other and unspecified hyperlipidemia 08/22/2013   Pre-diabetes     Past Surgical History:  Procedure Laterality Date   ABDOMINAL AORTIC ANEURYSM REPAIR N/A 04/28/2018   Procedure: ANEURYSM ABDOMINAL AORTIC REPAIR WITH HEMASHIELD GOLD VASCULAR GRAFT 22X11 mm;  Surgeon: Rosetta Posner, MD;  Location: [facility] OR;  Service: Vascular;  Laterality: N/A;   CARDIAC CATHETERIZATION  03/29/2001   negative   DILATION AND  CURETTAGE OF UTERUS  1985   TONSILLECTOMY Bilateral    age 76   TUBAL LIGATION  1985    MEDICATIONS:  acetaminophen (TYLENOL) 500 MG tablet   ALPRAZolam (XANAX) 0.25 MG tablet   amLODipine (NORVASC) 10 MG tablet   apixaban (ELIQUIS) 5 MG TABS tablet   cholecalciferol (VITAMIN D3) 25 MCG (1000 UNIT) tablet   citalopram (CELEXA) 10 MG tablet   levalbuterol (XOPENEX) 1.25 MG/0.5ML nebulizer solution   lisinopril (ZESTRIL) 20 MG tablet   metoprolol tartrate (LOPRESSOR) 50 MG tablet   pravastatin (PRAVACHOL) 20 MG tablet   No current facility-administered medications for this encounter.     Konrad Felix Ward, PA-C [facility]Surgical [facility] [phone]

## 2022-01-12 NOTE — Anesthesia Preprocedure Evaluation (Addendum)
Anesthesia Evaluation  Patient identified by MRN, date of birth, ID band Patient awake    Reviewed: Allergy & Precautions, NPO status , Patient's Chart, lab work & pertinent test results  Airway Mallampati: I  TM Distance: >3 FB Neck ROM: Full    Dental  (+) Edentulous Upper, Edentulous Lower   Pulmonary COPD, former smoker,     + decreased breath sounds      Cardiovascular hypertension, Pt. on medications and Pt. on home beta blockers  Rhythm:Regular Rate:Normal     Neuro/Psych PSYCHIATRIC DISORDERS Anxiety Depression    GI/Hepatic negative GI ROS, Neg liver ROS,   Endo/Other  negative endocrine ROS  Renal/GU negative Renal ROS     Musculoskeletal negative musculoskeletal ROS (+)   Abdominal   Peds  Hematology negative hematology ROS (+)   Anesthesia Other Findings   Reproductive/Obstetrics                           Anesthesia Physical Anesthesia Plan  ASA: 3  Anesthesia Plan: General   Post-op Pain Management:    Induction: Intravenous  PONV Risk Score and Plan: 4 or greater and Ondansetron, Dexamethasone, Midazolam and Treatment may vary due to age or medical condition  Airway Management Planned: Oral ETT  Additional Equipment: Arterial line  Intra-op Plan:   Post-operative Plan: Extubation in OR  Informed Consent: I have reviewed the patients History and Physical, chart, labs and discussed the procedure including the risks, benefits and alternatives for the proposed anesthesia with the patient or authorized representative who has indicated his/her understanding and acceptance.     Dental advisory given  Plan Discussed with: CRNA  Anesthesia Plan Comments: (See PAT note 01/08/2022  - 2 IV's)      Anesthesia Quick Evaluation

## 2022-01-22 ENCOUNTER — Inpatient Hospital Stay (HOSPITAL_COMMUNITY): Payer: Medicare HMO | Admitting: Physician Assistant

## 2022-01-22 ENCOUNTER — Encounter (HOSPITAL_COMMUNITY): Payer: Self-pay | Admitting: Surgery

## 2022-01-22 ENCOUNTER — Encounter (HOSPITAL_COMMUNITY)
Admission: RE | Disposition: A | Discharge status: Home / Self Care | Payer: Self-pay | Source: Ambulatory Visit | Attending: Surgery

## 2022-01-22 ENCOUNTER — Other Ambulatory Visit: Payer: Self-pay

## 2022-01-22 ENCOUNTER — Inpatient Hospital Stay (HOSPITAL_COMMUNITY): Payer: Medicare HMO | Admitting: Anesthesiology

## 2022-01-22 ENCOUNTER — Inpatient Hospital Stay (HOSPITAL_COMMUNITY)
Admission: RE | Admit: 2022-01-22 | Discharge: 2022-01-27 | DRG: 351 | Disposition: A | Discharge status: Home / Self Care | Payer: Medicare HMO | Source: Ambulatory Visit | Attending: Surgery | Admitting: Surgery

## 2022-01-22 DIAGNOSIS — K4020 Bilateral inguinal hernia, without obstruction or gangrene, not specified as recurrent: Secondary | ICD-10-CM | POA: Diagnosis not present

## 2022-01-22 DIAGNOSIS — K432 Incisional hernia without obstruction or gangrene: Secondary | ICD-10-CM | POA: Diagnosis present

## 2022-01-22 DIAGNOSIS — Z79899 Other long term (current) drug therapy: Secondary | ICD-10-CM | POA: Diagnosis not present

## 2022-01-22 DIAGNOSIS — R0602 Shortness of breath: Secondary | ICD-10-CM | POA: Diagnosis not present

## 2022-01-22 DIAGNOSIS — F419 Anxiety disorder, unspecified: Secondary | ICD-10-CM | POA: Diagnosis present

## 2022-01-22 DIAGNOSIS — Z8616 Personal history of COVID-19: Secondary | ICD-10-CM | POA: Diagnosis not present

## 2022-01-22 DIAGNOSIS — N182 Chronic kidney disease, stage 2 (mild): Secondary | ICD-10-CM | POA: Diagnosis present

## 2022-01-22 DIAGNOSIS — K567 Ileus, unspecified: Secondary | ICD-10-CM | POA: Diagnosis not present

## 2022-01-22 DIAGNOSIS — R0902 Hypoxemia: Secondary | ICD-10-CM | POA: Diagnosis not present

## 2022-01-22 DIAGNOSIS — J449 Chronic obstructive pulmonary disease, unspecified: Secondary | ICD-10-CM

## 2022-01-22 DIAGNOSIS — E785 Hyperlipidemia, unspecified: Secondary | ICD-10-CM | POA: Diagnosis present

## 2022-01-22 DIAGNOSIS — Z833 Family history of diabetes mellitus: Secondary | ICD-10-CM | POA: Diagnosis not present

## 2022-01-22 DIAGNOSIS — G4700 Insomnia, unspecified: Secondary | ICD-10-CM | POA: Diagnosis present

## 2022-01-22 DIAGNOSIS — I10 Essential (primary) hypertension: Secondary | ICD-10-CM

## 2022-01-22 DIAGNOSIS — F32A Depression, unspecified: Secondary | ICD-10-CM | POA: Diagnosis present

## 2022-01-22 DIAGNOSIS — E871 Hypo-osmolality and hyponatremia: Secondary | ICD-10-CM | POA: Diagnosis not present

## 2022-01-22 DIAGNOSIS — I480 Paroxysmal atrial fibrillation: Secondary | ICD-10-CM | POA: Diagnosis present

## 2022-01-22 DIAGNOSIS — Z7901 Long term (current) use of anticoagulants: Secondary | ICD-10-CM

## 2022-01-22 DIAGNOSIS — Z888 Allergy status to other drugs, medicaments and biological substances status: Secondary | ICD-10-CM | POA: Diagnosis not present

## 2022-01-22 DIAGNOSIS — K439 Ventral hernia without obstruction or gangrene: Secondary | ICD-10-CM | POA: Diagnosis present

## 2022-01-22 DIAGNOSIS — Z87891 Personal history of nicotine dependence: Secondary | ICD-10-CM

## 2022-01-22 DIAGNOSIS — I129 Hypertensive chronic kidney disease with stage 1 through stage 4 chronic kidney disease, or unspecified chronic kidney disease: Secondary | ICD-10-CM | POA: Diagnosis not present

## 2022-01-22 DIAGNOSIS — Z8249 Family history of ischemic heart disease and other diseases of the circulatory system: Secondary | ICD-10-CM | POA: Diagnosis not present

## 2022-01-22 DIAGNOSIS — J9601 Acute respiratory failure with hypoxia: Secondary | ICD-10-CM | POA: Diagnosis not present

## 2022-01-22 DIAGNOSIS — Z8679 Personal history of other diseases of the circulatory system: Secondary | ICD-10-CM | POA: Diagnosis not present

## 2022-01-22 DIAGNOSIS — R7303 Prediabetes: Secondary | ICD-10-CM | POA: Diagnosis present

## 2022-01-22 DIAGNOSIS — R059 Cough, unspecified: Secondary | ICD-10-CM | POA: Diagnosis not present

## 2022-01-22 DIAGNOSIS — K5900 Constipation, unspecified: Secondary | ICD-10-CM | POA: Diagnosis present

## 2022-01-22 HISTORY — PX: XI ROBOTIC ASSISTED VENTRAL HERNIA: SHX6789

## 2022-01-22 LAB — POCT I-STAT 7, (LYTES, BLD GAS, ICA,H+H)
Acid-Base Excess: 1 mmol/L (ref 0.0–2.0)
Bicarbonate: 27.8 mmol/L (ref 20.0–28.0)
Calcium, Ion: 1.36 mmol/L (ref 1.15–1.40)
HCT: 33 % — ABNORMAL LOW (ref 36.0–46.0)
Hemoglobin: 11.2 g/dL — ABNORMAL LOW (ref 12.0–15.0)
O2 Saturation: 100 %
Patient temperature: 36
Potassium: 4.5 mmol/L (ref 3.5–5.1)
Sodium: 136 mmol/L (ref 135–145)
TCO2: 29 mmol/L (ref 22–32)
pCO2 arterial: 50.9 mmHg — ABNORMAL HIGH (ref 32–48)
pH, Arterial: 7.341 — ABNORMAL LOW (ref 7.35–7.45)
pO2, Arterial: 192 mmHg — ABNORMAL HIGH (ref 83–108)

## 2022-01-22 LAB — BASIC METABOLIC PANEL
Anion gap: 9 (ref 5–15)
BUN: 20 mg/dL (ref 8–23)
CO2: 22 mmol/L (ref 22–32)
Calcium: 9.4 mg/dL (ref 8.9–10.3)
Chloride: 107 mmol/L (ref 98–111)
Creatinine, Ser: 0.95 mg/dL (ref 0.44–1.00)
GFR, Estimated: >60 mL/min (ref >60)
Glucose, Bld: 122 mg/dL — ABNORMAL HIGH (ref 70–99)
Potassium: 4.2 mmol/L (ref 3.5–5.1)
Sodium: 138 mmol/L (ref 135–145)

## 2022-01-22 LAB — CREATININE, SERUM
Creatinine, Ser: 1.23 mg/dL — ABNORMAL HIGH (ref 0.44–1.00)
GFR, Estimated: 46 mL/min — ABNORMAL LOW (ref >60)

## 2022-01-22 LAB — CBC
HCT: 38 % (ref 36.0–46.0)
Hemoglobin: 12.2 g/dL (ref 12.0–15.0)
MCH: 30.9 pg (ref 26.0–34.0)
MCHC: 32.1 g/dL (ref 30.0–36.0)
MCV: 96.2 fL (ref 80.0–100.0)
Platelets: 230 10*3/uL (ref 150–400)
RBC: 3.95 MIL/uL (ref 3.87–5.11)
RDW: 12.3 % (ref 11.5–15.5)
WBC: 18.3 10*3/uL — ABNORMAL HIGH (ref 4.0–10.5)
nRBC: 0 % (ref 0.0–0.2)

## 2022-01-22 LAB — TYPE AND SCREEN
ABO/RH(D): O POS
Antibody Screen: NEGATIVE

## 2022-01-22 LAB — ABO/RH: ABO/RH(D): O POS

## 2022-01-22 LAB — GLUCOSE, CAPILLARY: Glucose-Capillary: 138 mg/dL — ABNORMAL HIGH (ref 70–99)

## 2022-01-22 SURGERY — REPAIR, HERNIA, VENTRAL, ROBOT-ASSISTED
Anesthesia: General | Site: Abdomen

## 2022-01-22 MED ORDER — LACTATED RINGERS IV SOLN: INTRAVENOUS | Status: DC

## 2022-01-22 MED ORDER — OXYCODONE-ACETAMINOPHEN 5-325 MG PO TABS: 1.0000 | ORAL_TABLET | ORAL | 0 refills | Status: DC | PRN

## 2022-01-22 MED ORDER — LISINOPRIL 20 MG PO TABS
20.0000 mg | ORAL_TABLET | Freq: Two times a day (BID) | ORAL | Status: DC
  Administered 2022-01-22 – 2022-01-27 (×9): 20 mg via ORAL
  Filled 2022-01-22 (×10): qty 1

## 2022-01-22 MED ORDER — DOCUSATE SODIUM 100 MG PO CAPS
100.0000 mg | ORAL_CAPSULE | Freq: Two times a day (BID) | ORAL | Status: DC
  Administered 2022-01-22 – 2022-01-27 (×9): 100 mg via ORAL
  Filled 2022-01-22 (×10): qty 1

## 2022-01-22 MED ORDER — SUGAMMADEX SODIUM 200 MG/2ML IV SOLN
INTRAVENOUS | Status: DC | PRN
  Administered 2022-01-22: 200 mg via INTRAVENOUS

## 2022-01-22 MED ORDER — GABAPENTIN 300 MG PO CAPS
300.0000 mg | ORAL_CAPSULE | Freq: Three times a day (TID) | ORAL | Status: DC
  Administered 2022-01-22 – 2022-01-23 (×5): 300 mg via ORAL
  Filled 2022-01-22 (×5): qty 1

## 2022-01-22 MED ORDER — FENTANYL CITRATE (PF) 250 MCG/5ML IJ SOLN
INTRAMUSCULAR | Status: DC | PRN
  Administered 2022-01-22 (×2): 50 ug via INTRAVENOUS
  Administered 2022-01-22: 100 ug via INTRAVENOUS

## 2022-01-22 MED ORDER — BUPIVACAINE LIPOSOME 1.3 % IJ SUSP
INTRAMUSCULAR | Status: AC
  Filled 2022-01-22: qty 20

## 2022-01-22 MED ORDER — ONDANSETRON HCL 4 MG/2ML IJ SOLN
INTRAMUSCULAR | Status: DC | PRN
  Administered 2022-01-22: 4 mg via INTRAVENOUS

## 2022-01-22 MED ORDER — ACETAMINOPHEN 160 MG/5ML PO SOLN: 325.0000 mg | ORAL | Status: DC | PRN

## 2022-01-22 MED ORDER — LIDOCAINE 2% (20 MG/ML) 5 ML SYRINGE
INTRAMUSCULAR | Status: DC | PRN
  Administered 2022-01-22: 60 mg via INTRAVENOUS

## 2022-01-22 MED ORDER — ACETAMINOPHEN 10 MG/ML IV SOLN: 1000.0000 mg | Freq: Once | INTRAVENOUS | Status: DC | PRN

## 2022-01-22 MED ORDER — FENTANYL CITRATE (PF) 250 MCG/5ML IJ SOLN
INTRAMUSCULAR | Status: AC
  Filled 2022-01-22: qty 5

## 2022-01-22 MED ORDER — PRAVASTATIN SODIUM 20 MG PO TABS
20.0000 mg | ORAL_TABLET | Freq: Every day | ORAL | Status: DC
  Administered 2022-01-23 – 2022-01-27 (×5): 20 mg via ORAL
  Filled 2022-01-22 (×5): qty 1

## 2022-01-22 MED ORDER — SIMETHICONE 80 MG PO CHEW: 80.0000 mg | CHEWABLE_TABLET | Freq: Four times a day (QID) | ORAL | Status: DC | PRN

## 2022-01-22 MED ORDER — ENOXAPARIN SODIUM 40 MG/0.4ML IJ SOSY
40.0000 mg | PREFILLED_SYRINGE | INTRAMUSCULAR | Status: DC
  Administered 2022-01-23 – 2022-01-26 (×4): 40 mg via SUBCUTANEOUS
  Filled 2022-01-22 (×4): qty 0.4

## 2022-01-22 MED ORDER — FENTANYL CITRATE PF 50 MCG/ML IJ SOSY
25.0000 ug | PREFILLED_SYRINGE | INTRAMUSCULAR | Status: DC | PRN
  Administered 2022-01-22 (×3): 50 ug via INTRAVENOUS

## 2022-01-22 MED ORDER — KETAMINE HCL 50 MG/5ML IJ SOSY
PREFILLED_SYRINGE | INTRAMUSCULAR | Status: AC
  Filled 2022-01-22: qty 5

## 2022-01-22 MED ORDER — DEXAMETHASONE SODIUM PHOSPHATE 10 MG/ML IJ SOLN
INTRAMUSCULAR | Status: DC | PRN
  Administered 2022-01-22: 4 mg via INTRAVENOUS

## 2022-01-22 MED ORDER — METOPROLOL TARTRATE 50 MG PO TABS
50.0000 mg | ORAL_TABLET | Freq: Two times a day (BID) | ORAL | Status: DC
  Administered 2022-01-23 – 2022-01-27 (×9): 50 mg via ORAL
  Filled 2022-01-22 (×10): qty 1

## 2022-01-22 MED ORDER — HYDROMORPHONE HCL 1 MG/ML IJ SOLN
0.5000 mg | INTRAMUSCULAR | Status: DC | PRN
  Administered 2022-01-22: 0.5 mg via INTRAVENOUS
  Filled 2022-01-22: qty 0.5

## 2022-01-22 MED ORDER — ENOXAPARIN SODIUM 40 MG/0.4ML IJ SOSY
40.0000 mg | PREFILLED_SYRINGE | Freq: Once | INTRAMUSCULAR | Status: AC
  Administered 2022-01-22: 40 mg via SUBCUTANEOUS
  Filled 2022-01-22: qty 0.4

## 2022-01-22 MED ORDER — METHOCARBAMOL 1000 MG/10ML IJ SOLN: 500.0000 mg | Freq: Four times a day (QID) | INTRAVENOUS | Status: DC | PRN

## 2022-01-22 MED ORDER — ACETAMINOPHEN 325 MG PO TABS
650.0000 mg | ORAL_TABLET | Freq: Four times a day (QID) | ORAL | Status: DC
  Administered 2022-01-22 – 2022-01-23 (×3): 650 mg via ORAL
  Filled 2022-01-22 (×3): qty 2

## 2022-01-22 MED ORDER — CHLORHEXIDINE GLUCONATE CLOTH 2 % EX PADS: 6.0000 | MEDICATED_PAD | Freq: Once | CUTANEOUS | Status: DC

## 2022-01-22 MED ORDER — OXYCODONE HCL 5 MG PO TABS: 10.0000 mg | ORAL_TABLET | ORAL | Status: DC | PRN

## 2022-01-22 MED ORDER — ACETAMINOPHEN 325 MG PO TABS: 650.0000 mg | ORAL_TABLET | Freq: Four times a day (QID) | ORAL | Status: DC

## 2022-01-22 MED ORDER — PROPOFOL 10 MG/ML IV BOLUS
INTRAVENOUS | Status: DC | PRN
  Administered 2022-01-22: 120 mg via INTRAVENOUS
  Administered 2022-01-22: 30 mg via INTRAVENOUS

## 2022-01-22 MED ORDER — ROCURONIUM BROMIDE 10 MG/ML (PF) SYRINGE
PREFILLED_SYRINGE | INTRAVENOUS | Status: AC
  Filled 2022-01-22: qty 10

## 2022-01-22 MED ORDER — ROCURONIUM BROMIDE 10 MG/ML (PF) SYRINGE
PREFILLED_SYRINGE | INTRAVENOUS | Status: DC | PRN
  Administered 2022-01-22: 60 mg via INTRAVENOUS
  Administered 2022-01-22 (×3): 20 mg via INTRAVENOUS

## 2022-01-22 MED ORDER — GABAPENTIN 600 MG PO TABS
300.0000 mg | ORAL_TABLET | Freq: Three times a day (TID) | ORAL | Status: DC
  Filled 2022-01-22: qty 0.5

## 2022-01-22 MED ORDER — LIDOCAINE HCL (PF) 2 % IJ SOLN
INTRAMUSCULAR | Status: AC
  Filled 2022-01-22: qty 5

## 2022-01-22 MED ORDER — MIDAZOLAM HCL 2 MG/2ML IJ SOLN
INTRAMUSCULAR | Status: DC | PRN
  Administered 2022-01-22 (×2): 1 mg via INTRAVENOUS

## 2022-01-22 MED ORDER — ORAL CARE MOUTH RINSE: 15.0000 mL | Freq: Once | OROMUCOSAL | Status: AC

## 2022-01-22 MED ORDER — LIDOCAINE 20MG/ML (2%) 15 ML SYRINGE OPTIME
INTRAMUSCULAR | Status: DC | PRN
  Administered 2022-01-22: 1.5 mg/kg/h via INTRAVENOUS

## 2022-01-22 MED ORDER — PHENYLEPHRINE 80 MCG/ML (10ML) SYRINGE FOR IV PUSH (FOR BLOOD PRESSURE SUPPORT)
PREFILLED_SYRINGE | INTRAVENOUS | Status: AC
  Filled 2022-01-22: qty 10

## 2022-01-22 MED ORDER — FENTANYL CITRATE PF 50 MCG/ML IJ SOSY
PREFILLED_SYRINGE | INTRAMUSCULAR | Status: AC
  Filled 2022-01-22: qty 3

## 2022-01-22 MED ORDER — 0.9 % SODIUM CHLORIDE (POUR BTL) OPTIME
TOPICAL | Status: DC | PRN
  Administered 2022-01-22: 1000 mL

## 2022-01-22 MED ORDER — EPHEDRINE SULFATE-NACL 50-0.9 MG/10ML-% IV SOSY
PREFILLED_SYRINGE | INTRAVENOUS | Status: DC | PRN
  Administered 2022-01-22: 5 mg via INTRAVENOUS
  Administered 2022-01-22: 10 mg via INTRAVENOUS
  Administered 2022-01-22: 5 mg via INTRAVENOUS

## 2022-01-22 MED ORDER — ACETAMINOPHEN 325 MG PO TABS: 325.0000 mg | ORAL_TABLET | ORAL | Status: DC | PRN

## 2022-01-22 MED ORDER — ONDANSETRON HCL 4 MG/2ML IJ SOLN
4.0000 mg | Freq: Four times a day (QID) | INTRAMUSCULAR | Status: DC | PRN
  Administered 2022-01-22: 4 mg via INTRAVENOUS
  Filled 2022-01-22: qty 2

## 2022-01-22 MED ORDER — AMISULPRIDE (ANTIEMETIC) 5 MG/2ML IV SOLN: 10.0000 mg | Freq: Once | INTRAVENOUS | Status: DC | PRN

## 2022-01-22 MED ORDER — OXYCODONE HCL 5 MG PO TABS: 5.0000 mg | ORAL_TABLET | Freq: Once | ORAL | Status: DC | PRN

## 2022-01-22 MED ORDER — GABAPENTIN 300 MG PO CAPS
300.0000 mg | ORAL_CAPSULE | ORAL | Status: AC
  Administered 2022-01-22: 300 mg via ORAL
  Filled 2022-01-22: qty 1

## 2022-01-22 MED ORDER — ACETAMINOPHEN 500 MG PO TABS
1000.0000 mg | ORAL_TABLET | ORAL | Status: AC
  Administered 2022-01-22: 1000 mg via ORAL
  Filled 2022-01-22: qty 2

## 2022-01-22 MED ORDER — CITALOPRAM HYDROBROMIDE 20 MG PO TABS
10.0000 mg | ORAL_TABLET | Freq: Every day | ORAL | Status: DC
  Administered 2022-01-23 – 2022-01-27 (×5): 10 mg via ORAL
  Filled 2022-01-22 (×5): qty 1

## 2022-01-22 MED ORDER — PROCHLORPERAZINE EDISYLATE 10 MG/2ML IJ SOLN
10.0000 mg | INTRAMUSCULAR | Status: DC | PRN
  Administered 2022-01-22: 10 mg via INTRAVENOUS
  Filled 2022-01-22: qty 2

## 2022-01-22 MED ORDER — LIDOCAINE HCL 2 % IJ SOLN
INTRAMUSCULAR | Status: AC
  Filled 2022-01-22: qty 20

## 2022-01-22 MED ORDER — CHLORHEXIDINE GLUCONATE 0.12 % MT SOLN
15.0000 mL | Freq: Once | OROMUCOSAL | Status: AC
  Administered 2022-01-22: 15 mL via OROMUCOSAL

## 2022-01-22 MED ORDER — BUPIVACAINE LIPOSOME 1.3 % IJ SUSP: 20.0000 mL | Freq: Once | INTRAMUSCULAR | Status: DC

## 2022-01-22 MED ORDER — AMLODIPINE BESYLATE 10 MG PO TABS
10.0000 mg | ORAL_TABLET | Freq: Every day | ORAL | Status: DC
  Administered 2022-01-23 – 2022-01-27 (×5): 10 mg via ORAL
  Filled 2022-01-22 (×5): qty 1

## 2022-01-22 MED ORDER — BUPIVACAINE-EPINEPHRINE 0.25% -1:200000 IJ SOLN
INTRAMUSCULAR | Status: DC | PRN
  Administered 2022-01-22: 30 mL

## 2022-01-22 MED ORDER — OXYCODONE HCL 5 MG PO TABS
5.0000 mg | ORAL_TABLET | ORAL | Status: DC | PRN
  Administered 2022-01-24 – 2022-01-25 (×2): 5 mg via ORAL
  Filled 2022-01-22 (×2): qty 1

## 2022-01-22 MED ORDER — CEFAZOLIN SODIUM-DEXTROSE 2-4 GM/100ML-% IV SOLN
2.0000 g | INTRAVENOUS | Status: AC
  Administered 2022-01-22: 2 g via INTRAVENOUS
  Filled 2022-01-22: qty 100

## 2022-01-22 MED ORDER — OXYCODONE HCL 5 MG/5ML PO SOLN: 5.0000 mg | Freq: Once | ORAL | Status: DC | PRN

## 2022-01-22 MED ORDER — KETAMINE HCL 10 MG/ML IJ SOLN
INTRAMUSCULAR | Status: DC | PRN
  Administered 2022-01-22: 30 mg via INTRAVENOUS

## 2022-01-22 MED ORDER — PROPOFOL 10 MG/ML IV BOLUS
INTRAVENOUS | Status: AC
  Filled 2022-01-22: qty 20

## 2022-01-22 MED ORDER — BUPIVACAINE-EPINEPHRINE (PF) 0.25% -1:200000 IJ SOLN
INTRAMUSCULAR | Status: AC
  Filled 2022-01-22: qty 30

## 2022-01-22 MED ORDER — MIDAZOLAM HCL 2 MG/2ML IJ SOLN
INTRAMUSCULAR | Status: AC
  Filled 2022-01-22: qty 2

## 2022-01-22 MED ORDER — PROMETHAZINE HCL 25 MG/ML IJ SOLN: 6.2500 mg | INTRAMUSCULAR | Status: DC | PRN

## 2022-01-22 MED ORDER — BUPIVACAINE LIPOSOME 1.3 % IJ SUSP
INTRAMUSCULAR | Status: DC | PRN
  Administered 2022-01-22: 20 mL

## 2022-01-22 MED ORDER — LACTATED RINGERS IV SOLN: INTRAVENOUS | Status: DC | PRN

## 2022-01-22 MED ORDER — EPHEDRINE 5 MG/ML INJ
INTRAVENOUS | Status: AC
  Filled 2022-01-22: qty 5

## 2022-01-22 MED ORDER — ONDANSETRON HCL 4 MG/2ML IJ SOLN
INTRAMUSCULAR | Status: AC
  Filled 2022-01-22: qty 2

## 2022-01-22 SURGICAL SUPPLY — 71 items
BAG COUNTER SPONGE SURGICOUNT (BAG) ×1 IMPLANT
BLADE SURG SZ11 CARB STEEL (BLADE) ×1 IMPLANT
CHLORAPREP W/TINT 26 (MISCELLANEOUS) ×1 IMPLANT
COVER MAYO STAND STRL (DRAPES) ×1 IMPLANT
COVER TIP SHEARS 8 DVNC (MISCELLANEOUS) ×1 IMPLANT
COVER TIP SHEARS 8MM DA VINCI (MISCELLANEOUS) ×1
DEFOGGER SCOPE WARMER CLEARIFY (MISCELLANEOUS) IMPLANT
DERMABOND ADVANCED .7 DNX12 (GAUZE/BANDAGES/DRESSINGS) IMPLANT
DEVICE TROCAR PUNCTURE CLOSURE (ENDOMECHANICALS) IMPLANT
DRAPE ARM DVNC X/XI (DISPOSABLE) ×3 IMPLANT
DRAPE COLUMN DVNC XI (DISPOSABLE) ×1 IMPLANT
DRAPE DA VINCI XI ARM (DISPOSABLE) ×4
DRAPE DA VINCI XI COLUMN (DISPOSABLE) ×1
DRSG TEGADERM 2-3/8X2-3/4 SM (GAUZE/BANDAGES/DRESSINGS) IMPLANT
ELECT L-HOOK LAP 45CM DISP (ELECTROSURGICAL) ×1
ELECT PENCIL ROCKER SW 15FT (MISCELLANEOUS) ×1 IMPLANT
ELECT REM PT RETURN 15FT ADLT (MISCELLANEOUS) ×1 IMPLANT
ELECTRODE L-HOOK LAP 45CM DISP (ELECTROSURGICAL) ×1 IMPLANT
GLOVE BIO SURGEON STRL SZ7.5 (GLOVE) ×2 IMPLANT
GLOVE BIOGEL PI IND STRL 8 (GLOVE) ×2 IMPLANT
GOWN STRL REUS W/ TWL XL LVL3 (GOWN DISPOSABLE) ×3 IMPLANT
GOWN STRL REUS W/TWL XL LVL3 (GOWN DISPOSABLE) ×3
GRASPER SUT TROCAR 14GX15 (MISCELLANEOUS) IMPLANT
IRRIG SUCT STRYKERFLOW 2 WTIP (MISCELLANEOUS)
IRRIGATION SUCT STRKRFLW 2 WTP (MISCELLANEOUS) IMPLANT
KIT BASIN OR (CUSTOM PROCEDURE TRAY) ×1 IMPLANT
KIT TURNOVER KIT A (KITS) IMPLANT
MANIFOLD NEPTUNE II (INSTRUMENTS) ×1 IMPLANT
MESH 3DMAX 4X6 LT LRG (Mesh General) IMPLANT
MESH 3DMAX 4X6 RT LRG (Mesh General) IMPLANT
MESH SOFT 12X12IN BARD (Mesh General) IMPLANT
NDL SPNL 18GX3.5 QUINCKE PK (NEEDLE) ×1 IMPLANT
NEEDLE SPNL 18GX3.5 QUINCKE PK (NEEDLE) ×1 IMPLANT
PACK CARDIOVASCULAR III (CUSTOM PROCEDURE TRAY) ×1 IMPLANT
SEAL CANN UNIV 5-8 DVNC XI (MISCELLANEOUS) ×3 IMPLANT
SEAL XI 5MM-8MM UNIVERSAL (MISCELLANEOUS) ×3
SEALER VESSEL DA VINCI XI (MISCELLANEOUS)
SEALER VESSEL EXT DVNC XI (MISCELLANEOUS) IMPLANT
SET TUBE SMOKE EVAC HIGH FLOW (TUBING) ×1 IMPLANT
SOL ANTI FOG 6CC (MISCELLANEOUS) ×1 IMPLANT
SOLUTION ANTI FOG 6CC (MISCELLANEOUS) ×1
SOLUTION ELECTROLUBE (MISCELLANEOUS) ×1 IMPLANT
SPIKE FLUID TRANSFER (MISCELLANEOUS) ×1 IMPLANT
SUT MNCRL AB 4-0 PS2 18 (SUTURE) ×1 IMPLANT
SUT SILK 0 SH 30 (SUTURE) IMPLANT
SUT STRAFIX PDS 18 CTX (SUTURE) IMPLANT
SUT STRAFIX SPIRAL 2-0 3 (SUTURE) IMPLANT
SUT STRAFIX SPIRAL 2-0 5 (SUTURE) IMPLANT
SUT STRAFIX SPIRAL 2-0 9 (SUTURE) IMPLANT
SUT STRAFIX SYMMETRIC 0-0 12 (SUTURE)
SUT STRAFIX SYMMETRIC 0-0 18 (SUTURE)
SUT STRAFIX SYMMETRIC 0-0 24 (SUTURE)
SUT STRAFIX SYMMETRIC 1-0 12 (SUTURE)
SUT STRAFIX SYMMETRIC 1-0 24 (SUTURE) ×1
SUT STRATAFIX 0 PDS+ CT-2 23 (SUTURE) ×1
SUT VIC AB 2-0 SH 27 (SUTURE) ×3
SUT VIC AB 2-0 SH 27X BRD (SUTURE) IMPLANT
SUT VLOC 180 2-0 9IN GS21 (SUTURE) IMPLANT
SUTURE STRAFIX SYMMETRC 0-0 12 (SUTURE) IMPLANT
SUTURE STRAFIX SYMMETRC 0-0 18 (SUTURE) IMPLANT
SUTURE STRAFIX SYMMETRC 0-0 24 (SUTURE) IMPLANT
SUTURE STRAFIX SYMMETRC 1-0 12 (SUTURE) IMPLANT
SUTURE STRAFIX SYMMETRC 1-0 24 (SUTURE) IMPLANT
SUTURE STRATFX 0 PDS+ CT-2 23 (SUTURE) IMPLANT
SYR 20ML LL LF (SYRINGE) ×1 IMPLANT
TAPE STRIPS DRAPE STRL (GAUZE/BANDAGES/DRESSINGS) ×1 IMPLANT
TOWEL OR 17X26 10 PK STRL BLUE (TOWEL DISPOSABLE) ×1 IMPLANT
TOWEL OR NON WOVEN STRL DISP B (DISPOSABLE) IMPLANT
TROCAR ADV FIXATION 12X100MM (TROCAR) ×1 IMPLANT
TROCAR Z-THREAD FIOS 5X100MM (TROCAR) ×1 IMPLANT
TROCAR Z-THREAD OPTICAL 5X100M (TROCAR) IMPLANT

## 2022-01-22 NOTE — Transfer of Care (Signed)
Immediate Anesthesia Transfer of Care Note  Patient: Leah Mata  Procedure(s) Performed: XI ROBOTIC ASSISTED INCISIONAL HERNIA REPAIR WITH MESH (Abdomen) XI ROBOTIC ASSISTED BILATERAL INGUINAL  HERNIA WITH MESH (Abdomen)  Patient Location: PACU  Anesthesia Type:General  Level of Consciousness: awake, alert  and patient cooperative  Airway & Oxygen Therapy: Patient Spontanous Breathing and Patient connected to face mask oxygen  Post-op Assessment: Report given to RN and Post -op Vital signs reviewed and stable  Post vital signs: Reviewed and stable  Last Vitals:  Vitals Value Taken Time  BP 151/68 01/22/22 1221  Temp    Pulse 74 01/22/22 1227  Resp 21 01/22/22 1227  SpO2 100 % 01/22/22 1227  Vitals shown include unvalidated device data.  Last Pain:  Vitals:   01/22/22 0629  TempSrc:   PainSc: 0-No pain         Complications: No notable events documented.

## 2022-01-22 NOTE — Anesthesia Procedure Notes (Signed)
Arterial Line Insertion Start/End10/27/2023 7:15 AM, 01/22/2022 7:25 AM Performed by: Lollie Sails, CRNA, CRNA  Patient location: Pre-op. Preanesthetic checklist: patient identified, IV checked, risks and benefits discussed, surgical consent, monitors and equipment checked, pre-op evaluation, timeout performed and anesthesia consent Lidocaine 1% used for infiltration Left, radial was placed Catheter size: 20 G Hand hygiene performed  and maximum sterile barriers used   Attempts: 1 Procedure performed without using ultrasound guided technique. Following insertion, dressing applied and Biopatch. Post procedure assessment: normal and unchanged  Patient tolerated the procedure well with no immediate complications.

## 2022-01-22 NOTE — Anesthesia Procedure Notes (Signed)
Procedure Name: Intubation Date/Time: 01/22/2022 7:43 AM  Performed by: Lollie Sails, CRNAPre-anesthesia Checklist: Patient identified, Emergency Drugs available, Suction available, Patient being monitored and Timeout performed Patient Re-evaluated:Patient Re-evaluated prior to induction Oxygen Delivery Method: Circle system utilized Preoxygenation: Pre-oxygenation with 100% oxygen Induction Type: IV induction Ventilation: Mask ventilation without difficulty Laryngoscope Size: Miller and 3 Grade View: Grade I Tube type: Oral Tube size: 7.5 mm Number of attempts: 1 Airway Equipment and Method: Stylet Placement Confirmation: ETT inserted through vocal cords under direct vision, positive ETCO2 and breath sounds checked- equal and bilateral Secured at: 22 cm Tube secured with: Tape Dental Injury: Teeth and Oropharynx as per pre-operative assessment

## 2022-01-22 NOTE — Discharge Instructions (Signed)
 VENTRAL HERNIA REPAIR POST OPERATIVE INSTRUCTIONS  Thinking Clearly  The anesthesia may cause you to feel different for 1 or 2 days. Do not drive, drink alcohol, or make any big decisions for at least 2 days.  Nutrition When you wake up, you will be able to drink small amounts of liquid. If you do not feel sick, you can slowly advance your diet to regular foods. Continue to drink lots of fluids, usually about 8 to 10 glasses per day. Eat a high-fiber diet so you don't strain during bowel movements. High-Fiber Foods Foods high in fiber include beans, bran cereals and whole-grain breads, peas, dried fruit (figs, apricots, and dates), raspberries, blackberries, strawberries, sweet corn, broccoli, baked potatoes with skin, plums, pears, apples, greens, and nuts. Activity Slowly increase your activity. Be sure to get up and walk every hour or so to prevent blood clots. No heavy lifting or strenuous activity for 4 weeks following surgery to prevent hernias at your incision sites or recurrence of your hernia. It is normal to feel tired. You may need more sleep than usual.  Get your rest but make sure to get up and move around frequently to prevent blood clots and pneumonia.  Work and Return to School You can go back to work when you feel well enough. Discuss the timing with your surgeon. You can usually go back to school or work 1 week or less after an laparoscopic or an open repair. If your work requires heavy lifting or strenuous activity you need to be placed on light duty for 4 weeks following surgery. You can return to gym class, sports or other physical activities 4 weeks after surgery.  Wound Care You may experience significant bruising throughout the abdominal wall that may track down into the groin including into the scrotum in males.  Rest, elevating the groin and scrotum above the level of the heart, ice and compression with tight fitting underwear or an abdominal binder can help.   Always wash your hands before and after touching near your incision site. Do not soak in a bathtub until cleared at your follow up appointment. You may take a shower 24 hours after surgery. A small amount of drainage from the incision is normal. If the drainage is thick and yellow or the site is red, you may have an infection, so call your surgeon. If you have a drain in one of your incisions, it will be taken out in office when the drainage stops. Steri-Strips will fall off in 7 to 10 days or they will be removed during your first office visit. If you have dermabond glue covering over the incision, allow the glue to flake off on its own. Protect the new skin, especially from the sun. The sun can burn and cause darker scarring. Your scar will heal in about 4 to 6 weeks and will become softer and continue to fade over the next year.  The cosmetic appearance of the incisions will improve over the course of the first year after surgery. Sensation around your incision will return in a few weeks or months.  Bowel Movements After intestinal surgery, you may have loose watery stools for several days. If watery diarrhea lasts longer than 3 days, contact your surgeon. Pain medication (narcotics) can cause constipation. Increase the fiber in your diet with high-fiber foods if you are constipated. You can take an over the counter stool softener like Colace to avoid constipation.  Additional over the counter medications can also be used   if Colace isn't sufficient (for example, Milk of Magnesia or Miralax).  Pain The amount of pain is different for each person. Some people need only 1 to 3 doses of pain control medication, while others need more. Take alternating doses of tylenol and ibuprofen around the clock for the first five days following surgery.  This will provide a baseline of pain control and help with inflammation.  Take the narcotic pain medication in addition if needed for severe pain.  Contact  Your Surgeon at [phone], if you have: Pain that will not go away Pain that gets worse A fever of more than 101F (38.3C) Repeated vomiting Swelling, redness, bleeding, or bad-smelling drainage from your wound site Strong abdominal pain No bowel movement or unable to pass gas for 3 days Watery diarrhea lasting longer than 3 days  Pain Control The goal of pain control is to minimize pain, keep you moving and help you heal. Your surgical team will work with you on your pain plan. Most often a combination of therapies and medications are used to control your pain. You may also be given medication (local anesthetic) at the surgical site. This may help control your pain for several days. Extreme pain puts extra stress on your body at a time when your body needs to focus on healing. Do not wait until your pain has reached a level "10" or is unbearable before telling your doctor or nurse. It is much easier to control pain before it becomes severe. Following a laparoscopic procedure, pain is sometimes felt in the shoulder. This is due to the gas inserted into your abdomen during the procedure. Moving and walking helps to decrease the gas and the right shoulder pain.  Use the guide below for ways to manage your post-operative pain. Learn more by going to facs.org/safepaincontrol.  How Intense Is My Pain Common Therapies to Feel Better       I hardly notice my pain, and it does not interfere with my activities.  I notice my pain and it distracts me, but I can still do activities (sitting up, walking, standing).  Non-Medication Therapies  Ice (in a bag, applied over clothing at the surgical site), elevation, rest, meditation, massage, distraction (music, TV, play) walking and mild exercise Splinting the abdomen with pillows +  Non-Opioid Medications Acetaminophen (Tylenol) Non-steroidal anti-inflammatory drugs (NSAIDS) Aspirin, Ibuprofen (Motrin, Advil) Naproxen (Aleve) Take these as  needed, when you feel pain. Both acetaminophen and NSAIDs help to decrease pain and swelling (inflammation).      My pain is hard to ignore and is more noticeable even when I rest.  My pain interferes with my usual activities.  Non-Medication Therapies  +  Non-Opioid medications  Take on a regular schedule (around-the-clock) instead of as needed. (For example, Tylenol every 6 hours at 9:00 am, 3:00 pm, 9:00 pm, 3:00 am and Motrin every 6 hours at 12:00 am, 6:00 am, 12:00 pm, 6:00 pm)         I am focused on my pain, and I am not doing my daily activities.  I am groaning in pain, and I cannot sleep. I am unable to do anything.  My pain is as bad as it could be, and nothing else matters.  Non-Medication Therapies  +  Around-the-Clock Non-Opioid Medications  +  Short-acting opioids  Opioids should be used with other medications to manage severe pain. Opioids block pain and give a feeling of euphoria (feel high). Addiction, a serious side effect of opioids, is   rare with short-term (a few days) use.  Examples of short-acting opioids include: Tramadol (Ultram), Hydrocodone (Norco, Vicodin), Hydromorphone (Dilaudid), Oxycodone (Oxycontin)     The above directions have been adapted from the American College [employer] Surgical Patient Education Program.  Please refer to the ACS website if needed: https://www.facs.org/-/media/files/education/patient-ed/[email or web address]_[email or web address].ashx   Deysy Schabel, MD [facility] [address], [address], [place], [place]  [address] ?  [address] [address], [place], [place]   [address] [phone] ? [phone] ? FAX [phone] Web site: [email or web address]  

## 2022-01-22 NOTE — H&P (Signed)
Admitting Physician: [place]  Service: general surgery  CC: hernia  Subjective   HPI: Leah Mata is an 76 y.o. 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   Other and unspecified hyperlipidemia 08/22/2013   Pre-diabetes     Past Surgical History:  Procedure Laterality Date   ABDOMINAL AORTIC ANEURYSM REPAIR N/A 04/28/2018   Procedure: ANEURYSM ABDOMINAL AORTIC REPAIR WITH HEMASHIELD GOLD VASCULAR GRAFT 22X11 mm;  Surgeon: Rosetta Posner, MD;  Location: [facility] OR;  Service: Vascular;  Laterality: N/A;   CARDIAC CATHETERIZATION  03/29/2001   negative   DILATION AND CURETTAGE OF UTERUS  1985   TONSILLECTOMY Bilateral    age 78   TUBAL LIGATION  1985    Family History  Problem Relation Age of Onset   Coronary artery disease Other        female 1st degree relative <60 and female 1st degree relative <50   Diabetes Father    Heart attack Father    Diabetes Mother    Hypertension Mother    Congestive Heart Failure Mother    Colon cancer Neg Hx    Breast cancer Neg Hx     Social:  reports that she quit smoking about 3 years ago. Her smoking use included cigarettes. She has a 15.00 pack-year smoking history. She has never used smokeless tobacco. She reports that she does not drink alcohol and does not use drugs.  Allergies:  Allergies  Allergen Reactions   Hydrochlorothiazide Other (See Comments)    unknown    Medications: Current Outpatient Medications  Medication Instructions   acetaminophen (TYLENOL) 1,000 mg, Oral, Every 6 hours PRN   ALPRAZolam (XANAX) 0.25 MG tablet TAKE 1 TABLET BY MOUTH TWICE A DAY AS NEEDED FOR ANXIETY   amLODipine (NORVASC) 10 MG tablet TAKE 1 TABLET EVERY DAY   apixaban (ELIQUIS) 5 mg, Oral, 2 times daily   cholecalciferol (VITAMIN D3) 1,000 Units, Oral, Daily   citalopram  (CELEXA) 10 MG tablet TAKE 1 TABLET EVERY DAY   levalbuterol (XOPENEX) 1.25 mg, Nebulization, Every 6 hours PRN   lisinopril (ZESTRIL) 20 MG tablet TAKE 1 TABLET TWICE DAILY   metoprolol tartrate (LOPRESSOR) 50 MG tablet TAKE 1 TABLET TWICE DAILY   pravastatin (PRAVACHOL) 20 MG tablet TAKE 1 TABLET EVERY DAY    ROS - all of the below systems have been reviewed with the patient and positives are indicated with bold text General: chills, fever or night sweats Eyes: blurry vision or double vision ENT: epistaxis or sore throat Allergy/Immunology: itchy/watery eyes or nasal congestion Hematologic/Lymphatic: bleeding problems, blood clots or swollen lymph nodes Endocrine: temperature intolerance or unexpected weight changes Breast: new or changing breast lumps or nipple discharge Resp: cough, shortness of breath, or wheezing CV: chest pain or dyspnea on exertion GI: as per HPI GU: dysuria, trouble voiding, or hematuria MSK: joint pain or joint stiffness Neuro: TIA or stroke symptoms Derm: pruritus and skin lesion changes Psych: anxiety and depression  Objective   PE Blood pressure 132/61, pulse (!) 50, temperature 98.1 F (36.7 C), temperature source Oral, resp. rate 18, weight 84.4 kg, last menstrual period 03/29/1993, SpO2 94 %. Constitutional: NAD; conversant; no deformities Eyes: Moist conjunctiva; no lid lag; anicteric; PERRL Neck: Trachea midline; no thyromegaly Lungs: Normal respiratory effort; no tactile fremitus CV:  RRR; no palpable thrills; no pitting edema GI: Abd Soft, nontender, large epigastric incisional hernia; bilateral inguinal hernias, no palpable hepatosplenomegaly MSK: Normal range of motion of extremities; no clubbing/cyanosis Psychiatric: Appropriate affect; alert and oriented x3 Lymphatic: No palpable cervical or axillary lymphadenopathy  Results for orders placed or performed during the hospital encounter of 01/22/22 (from the past 24 hour(s))  Glucose,  capillary     Status: Abnormal   Collection Time: 01/22/22  6:07 AM  Result Value Ref Range   Glucose-Capillary 138 (H) 70 - 99 mg/dL    Imaging Orders  No imaging studies ordered today   CT abd/pel 09/21/21 1. Large complex supraumbilical ventral hernia containing mesenteric fat, small bowel, colon and anterior wall of distal stomach without evidence for obstruction or incarceration. Moderate sized fat and small bowel containing umbilical hernia also without obstructive features. 2. Patient is status post aortoiliac graft with interval decrease in size of the residual distal aneurysm sac. 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  Assessment and Plan   Leah Mata is an 76 y.o. female  Assessment and Plan:   Diagnoses and all orders for this visit:  Incisional hernia, without obstruction or gangrene  Non-recurrent bilateral inguinal hernia without obstruction or gangrene  History of AAA (abdominal aortic aneurysm) repair  Chronic obstructive pulmonary disease, unspecified COPD type (CMS-HCC)    Leah Mata has a large incisional hernia in the epigastric region. This is very symptomatic with multiple GI obstructive symptoms and pain in this area. It is gotten to the point where it is severely affected her lifestyle and she would like to have it repaired. I recommended robotic abdominal wall reconstruction with retromuscular mesh placement for incisional hernia repair and bilateral inguinal hernia repairs and bilateral transversus abdominis releases. We discussed the surgery as well as its risk, benefits, and alternatives. We discussed the risk of infection, bleeding, damage nearby structures. We discussed the risk of bleeding issues is increased that she is on a blood thinner. We discussed the pulmonary risks are increased to her COPD. After full discussion all questions answered the patient would  like to proceed.     Felicie Morn, MD  [facility] Use [email or web address] to contact on call provider

## 2022-01-22 NOTE — Anesthesia Postprocedure Evaluation (Signed)
Anesthesia Post Note  Patient: Leah Mata  Procedure(s) Performed: XI ROBOTIC ASSISTED INCISIONAL HERNIA REPAIR WITH MESH (Abdomen) XI ROBOTIC ASSISTED BILATERAL INGUINAL  HERNIA WITH MESH (Abdomen)     Patient location during evaluation: PACU Anesthesia Type: General Level of consciousness: awake and alert Pain management: pain level controlled Vital Signs Assessment: post-procedure vital signs reviewed and stable Respiratory status: spontaneous breathing, nonlabored ventilation, respiratory function stable and patient connected to nasal cannula oxygen Cardiovascular status: blood pressure returned to baseline and stable Postop Assessment: no apparent nausea or vomiting Anesthetic complications: no   No notable events documented.  Last Vitals:  Vitals:   01/22/22 1523 01/22/22 1635  BP: 125/70 114/60  Pulse: 79 71  Resp: 17 17  Temp: 36.6 C 36.5 C  SpO2: 97% 97%    Last Pain:  Vitals:   01/22/22 1540  TempSrc:   PainSc: Asleep                 Effie Berkshire

## 2022-01-22 NOTE — Op Note (Signed)
Patient: Leah Mata (Feb 17, 1946, [id number])  Date of Surgery: 01/22/2022   Preoperative Diagnosis: INCISIONAL HERNIA BILATERAL INGUINAL HERNIAS   Postoperative Diagnosis: INCISIONAL HERNIA BILATERAL INGUINAL HERNIAS   Surgical Procedure:  XI Robotic Assisted Incisional Hernia Repair With Mesh XI Robotic Assisted Bilateral Posterior Rectus Myofascial Release XI Robotic Assisted Bilateral Transversus Abdominis Myofascial Release XI Robotic Assisted Bilateral Inguinal Hernia Repair With Mesh  Operative Team Members:   Louanna Raw, MD - Primary   Anesthesiologist: Effie Berkshire, MD CRNA: Lollie Sails, CRNA; Gean Maidens, CRNA   Anesthesia: General   Fluids:  Total I/O In: 2415 [I.V.:2315; IV Piggyback:100] Out: 115 [Urine:65; [id number]  Complications: None  Drains:  None  Specimen: None   Disposition:  PACU - hemodynamically stable.  Plan of Care: Admit for overnight observation  Indications for Procedure: Leah Mata is a 76 y.o. female who presented with an incisional hernia from previous ruptured abdominal aortic aneurysm repair.  The hernia was located at the superior aspect of an incision from the xiphoid to the pubic symphysis.  She was noted on preoperative CT to have bilateral fat containing inguinal hernias.  I recommended robotic repair of the incisional and inguinal hernias.  The procedure itself as well as the risks, benefits and alternatives were described.  The risks discussed included but were not limited to the risk of infection, bleeding, damage to nearby structures, recurrent hernia, chronic pain, and mesh complication requiring removal.  After a full discussion and all questions answered, the patient granted consent to proceed.  Findings:   Incisional hernia: Hernia Location: Ventral hernia location: Subxiphoid (M1), Epigastric (M2), Umbilical (M3), and Infraumbilical (M4) Hernia Size:  16cm tall x 11  cm wide  Mesh Size &Type:  30 cm  tall x 28 cm wide Bard Soft Mesh Mesh Position: Sublay - Retromuscular Myofascial Releases:  Bilateral posterior rectus myofascial release Bilateral transversus abdominis release   Inguinal hernia Hernia Location: indirect right and indirect left inguinal hernias Mesh Size and type: Bard 3D max large right and Bard 3D Max large left meshes Mesh Fixation: 0 Silk Suture fixing the mesh to Coopers ligament   Description of Procedure: The patient was positioned supine, moderately flexed at the umbilical level, padded and secured on the operating table.  A timeout procedure was performed.    What is described is a robotic, totally extraperitoneal retromuscular incisional hernia repair with bilateral rectus myofascial release, bilateral transversus abdominis release and retromuscular mesh placement.  Bilateral inguinal hernias were dissected and repaired as well.  Due to the large subxiphoid nature of this hernia and the need to dissect out the inguinal canals, a variation my technique was used where I first docked the robot just below the level of the umbilicus and worked superiorly.  Then the robot was redocked making new trocar incisions prior to the abdomen to work inferiorly.  Laparoscopic Portion: The retrorectus space was entered in the LEFT hypochondrium, at approximately the midclavicular line utilizing a 5 mm optical-viewing trocar.  Upon safe entry into this space, it was insufflated while performing a blunt dissection with the camera still in the optical trocar. A rectus myofascial release was performed on the LEFT side. Dissection was carried out laterally in the retromuscular plane to the edge of the rectus sheath progressively disconnecting the rectus muscle from the underlying posterior rectus sheath. Both the segmental innervation as well as the intercostal artery and vein brances to the rectus muscle were individually preserved.  During the left sided retrorectus dissection, a 12  mm trocar was placed into the lateral most edge of the retrorectus space.  With these initial trocars in position, the medial most aspect of the retrorectus plane was identified, and the posterior sheath was visualized as it inserted on the linea alba. The posterior sheath was incised with cautery entering the preperitoneal plane. A crossover was performed dissecting under the linea alba in the preperitoneal plane until the right rectus sheath was identified.  After identification of the right rectus sheath, it was incised vertically to enter the retrorectus space on the right. A rectus myofascial release was performed on the RIGHT side.  Blunt dissection was carried out laterally in the retromuscular plane to the edge of the rectus sheath progressively disconnecting the rectus muscle from the underlying posterior rectus sheath. Both the segmental innervation as well as the intercostal artery and vein brances to the rectus muscle were individually preserved.   At this juncture, both retrorectus planes were initially connected to each other and there was space for further trocar placement. An 8 mm robotic trocar was placed in the midclavicular line in right retrorectus space.  A 26mm robotic trocar was placed within the left rectus musculature in the mid abdomen, and not through the linea alba.  The initial 5 mm access trocar in the midclavicular line within the left retrorectus space was switched out for an 8 mm robotic trocar.   The three initial trocars were just below the level of the hernia, just below the umbilicus.  This allowed [place] to proceed robotically and reduce the hernia.  Once the superior aspect of the dissection and closure was completed, the trocars were moved to the upper abdomen and I worked from the top down.    Robotic Portion: The Intuitive daVinci Xi surgical robot was docked in the standard fashion and the procedure begun from the robotic console. A Prograsp instrument and monopolar  shears were used for the dissection.  Dissection was carried down inferiorly preserving the peritoneum and the preperitoneal fat in the midline as it was gently dissected off of the overlying linea alba.  On the right side, the posterior rectus sheath was progressively disconnected from its insertion on the linea alba. This allowed for progression of the right side rectus myofascial release.  The rectus myofascial release accomplished medialization of the posterior rectus sheath towards the midline and disinsertion of the rectus muscle from its surrounding fascia, and thus its encasement in the rectus sheath, allowing for widening of the rectus muscle and transfer of the rectus flap towards the midline.  This will allow for future inset of the medial aspect of the flap for abdominal wall reconstruction.  Similarly, on the left side, the posterior rectus sheath was also progressively disconnected from its insertion on the linea alba.  This allowed for progression of the left side rectus myofascial release.  The rectus myofascial release accomplished medialization of the posterior rectus sheath towards the midline and disinsertion of the rectus muscle from its surrounding fascia, and thus its encasement in the rectus sheath, allowing for widening of the rectus muscle and transfer of the rectus flap towards the midline.  This will allow for future inset of the medial aspect of the flap for abdominal wall reconstruction.  During the dissection of the midline the hernia defect was identified and the hernia sac was not reducible, therefore the hernia peritoneum was incised circumferentially around the edge of the hernia defect which left a defect  within the peritoneum in the midline.  This defect was later closed with a running 2-0 v-loc suture.  Both the left and the right rectus myofascial releases were performed towards the lower abdomen, past the arcuate line bilaterally.  During this dissection, the  peritoneum and preperitoneal fat in the midline were further preserved below the hernia as they were dissected off of the overlying linea alba.   The hernia defect area was now visualized fully.  The hernia defects were located in the Ventral hernia location: Subxiphoid (M1), Epigastric (M2), Umbilical (M3), and Infraumbilical (M4) regions. Utilizing a metric ruler, the defect are was measured intracorporeally to be 11 cm horizontal by 16 cm vertical.  The inguinal hernias were dissected.  There was an indirect hernia on the RIGHT.  It contained fatty tissue.  Dissection was carried out in the pre peritoneal space down to the level of the hernia sac which was reduced into the peritoneal cavity completely.  The round ligament was identified and divided utilizing cautery.  A large pre peritoneal dissection was performed to uncover the direct, indirect, femoral and obturator spaces.  Cooper's ligament was uncovered medially and the psoas muscle uncovered laterally.  The mesh, as documented above, was opened and advanced into the pre peritoneal position so that it more than adequately covered the indirect, direct, femoral and obturator spaces.  The mesh laid flat, with no inferior folds and covered the entire myopectineal orifice.  The mesh was fixated with an 0-silk suture to Cooper's ligament and the posterior aspect of the rectus muscle.    There was an indirect hernia on the LEFT.  It contained fatty tissue.  Dissection was carried out in the pre peritoneal space down to the level of the hernia sac which was reduced into the peritoneal cavity completely.  The round ligament was identified and divided utilizing cautery.  A large pre peritoneal dissection was performed to uncover the direct, indirect, femoral and obturator spaces.  Cooper's ligament was uncovered medially and the psoas muscle uncovered laterally.  The mesh, as documented above, was opened and advanced into the pre peritoneal position so  that it more than adequately covered the indirect, direct, femoral and obturator spaces.  The mesh laid flat, with no inferior folds and covered the entire myopectineal orifice.  The mesh was fixated with an 0-silk suture to Cooper's ligament and the posterior aspect of the rectus muscle.     A transversus abdominis release (TAR) was performed on the left side.  The transversus abdominis muscle was identified deep to the posterior rectus sheath and incised vertically along its entire length, entering the pre-peritoneal or pre-transversalis fascia plane.  This disinserted the transversus abdominis muscle from the linea semilunaris.  Since the intercostal nerves, arteries and veins had been preserved during the rectus myofascial release portion of the procedure, they remained intact during the TAR. The peritoneum was subsequently peeled away from the underside of the divided transversus abdominis muscle.  This dissection was carried out laterally towards the retroperitoneum.  The TAR accomplished additional medialization of the posterior rectus sheath with its attached peritoneum towards the midline to allow for visceral sac closure.  The TAR also provided further offset of tension of the rectus muscle flap with additional transfer of the rectus muscle towards the midline, as it remained attached to the external and internal abdominal oblique muscles.  This will allow for future inset of the medial aspect of the flap for abdominal wall reconstruction.   A transversus abdominis  release (TAR) was performed on the right side.  The transversus abdominis muscle was identified deep to the posterior rectus sheath and incised vertically along its entire length, entering the pre-peritoneal or pre-transversalis fascia plane.  This disinserted the transversus abdominis muscle from the linea semilunaris.  Since the intercostal nerves, arteries and veins had been preserved during the rectus myofascial release portion of the  procedure, they remained intact during the TAR. The peritoneum was subsequently peeled away from the underside of the divided transversus abdominis muscle.  This dissection was carried out laterally towards the retroperitoneum.  The TAR accomplished additional medialization of the posterior rectus sheath with its attached peritoneum towards the midline to allow for visceral sac closure.  The TAR also provided further offset of tension of the rectus muscle flap with additional transfer of the rectus muscle towards the midline, as it remained attached to the external and internal abdominal oblique muscles.  This will allow for future inset of the medial aspect of the flap for abdominal wall reconstruction.     The hernia defect was closed utilizing a continuous, #1 Ethicon Stratafix Symmetric PDS Plus suture.  The hernia defect, and subsequently the rectus musculature, came together well for a complete abdominal wall reconstruction.  The dissected out retrorectus space was measured with a metric ruler so as to determine the size of the proposed mesh.    The robot was undocked and the laparoscope was inserted, inspecting for hemostasis.  The mesh deployment was performed laparoscopically.  Laparoscopic Portion:  A transversus abdominis plane (TAP) block was performed bilaterally with a mixture of marcaine and Exparel.  The anesthetic was first injected into the plane between the transversus abdominis and internal abdominal oblique muscles on the left. The TAP was repeated on the contralateral side.   A piece of Bard Soft was opened and trimmed to 30 cm tall x 28 cm wide. The mesh was advanced into the retrorectus space and the mesh positioned flat against the intact posterior rectus sheaths. The mesh was not fixated as it occupied the entire retromuscular plane, and also covered all of the trocars.  It overlapped the inguinal hernia repair meshes previously placed inferiorly and was tucked under the  diaphragm superiorly.  The trocars were removed and the skin closed with 4-0 Monocryl subcuticular sutures and skin glue.   Louanna Raw, MD General, Bariatric, & Minimally Invasive Surgery [facility], [place]

## 2022-01-23 ENCOUNTER — Encounter (HOSPITAL_COMMUNITY): Payer: Self-pay | Admitting: Surgery

## 2022-01-23 ENCOUNTER — Inpatient Hospital Stay (HOSPITAL_COMMUNITY): Payer: Medicare HMO

## 2022-01-23 LAB — CBC
HCT: 37 % (ref 36.0–46.0)
Hemoglobin: 11.7 g/dL — ABNORMAL LOW (ref 12.0–15.0)
MCH: 30.9 pg (ref 26.0–34.0)
MCHC: 31.6 g/dL (ref 30.0–36.0)
MCV: 97.6 fL (ref 80.0–100.0)
Platelets: 180 10*3/uL (ref 150–400)
RBC: 3.79 MIL/uL — ABNORMAL LOW (ref 3.87–5.11)
RDW: 12.7 % (ref 11.5–15.5)
WBC: 13.3 10*3/uL — ABNORMAL HIGH (ref 4.0–10.5)
nRBC: 0 % (ref 0.0–0.2)

## 2022-01-23 LAB — BASIC METABOLIC PANEL
Anion gap: 8 (ref 5–15)
BUN: 31 mg/dL — ABNORMAL HIGH (ref 8–23)
CO2: 24 mmol/L (ref 22–32)
Calcium: 9.5 mg/dL (ref 8.9–10.3)
Chloride: 105 mmol/L (ref 98–111)
Creatinine, Ser: 1.3 mg/dL — ABNORMAL HIGH (ref 0.44–1.00)
GFR, Estimated: 43 mL/min — ABNORMAL LOW (ref >60)
Glucose, Bld: 132 mg/dL — ABNORMAL HIGH (ref 70–99)
Potassium: 4.9 mmol/L (ref 3.5–5.1)
Sodium: 137 mmol/L (ref 135–145)

## 2022-01-23 MED ORDER — SODIUM CHLORIDE 0.9 % IV SOLN: 250.0000 mL | INTRAVENOUS | Status: DC | PRN

## 2022-01-23 MED ORDER — MENTHOL 3 MG MT LOZG: 1.0000 | LOZENGE | OROMUCOSAL | Status: DC | PRN

## 2022-01-23 MED ORDER — POLYETHYLENE GLYCOL 3350 17 G PO PACK
17.0000 g | PACK | Freq: Two times a day (BID) | ORAL | Status: DC
  Administered 2022-01-23 – 2022-01-25 (×6): 17 g via ORAL
  Filled 2022-01-23 (×9): qty 1

## 2022-01-23 MED ORDER — VITAMIN D 25 MCG (1000 UNIT) PO TABS
1000.0000 [IU] | ORAL_TABLET | Freq: Every day | ORAL | Status: DC
  Administered 2022-01-24 – 2022-01-27 (×4): 1000 [IU] via ORAL
  Filled 2022-01-23 (×4): qty 1

## 2022-01-23 MED ORDER — PHENOL 1.4 % MT LIQD: 2.0000 | OROMUCOSAL | Status: DC | PRN

## 2022-01-23 MED ORDER — LACTATED RINGERS IV BOLUS: 1000.0000 mL | Freq: Three times a day (TID) | INTRAVENOUS | Status: AC | PRN

## 2022-01-23 MED ORDER — ACETAMINOPHEN 500 MG PO TABS
1000.0000 mg | ORAL_TABLET | Freq: Four times a day (QID) | ORAL | Status: DC
  Administered 2022-01-23 – 2022-01-27 (×12): 1000 mg via ORAL
  Filled 2022-01-23 (×15): qty 2

## 2022-01-23 MED ORDER — SODIUM CHLORIDE 0.9% FLUSH
3.0000 mL | Freq: Two times a day (BID) | INTRAVENOUS | Status: DC
  Administered 2022-01-23 – 2022-01-26 (×8): 3 mL via INTRAVENOUS

## 2022-01-23 MED ORDER — SIMETHICONE 80 MG PO CHEW
80.0000 mg | CHEWABLE_TABLET | Freq: Four times a day (QID) | ORAL | Status: AC
  Administered 2022-01-23 – 2022-01-26 (×10): 80 mg via ORAL
  Filled 2022-01-23 (×11): qty 1

## 2022-01-23 MED ORDER — ALUM & MAG HYDROXIDE-SIMETH 200-200-20 MG/5ML PO SUSP: 30.0000 mL | Freq: Four times a day (QID) | ORAL | Status: DC | PRN

## 2022-01-23 MED ORDER — BISACODYL 10 MG RE SUPP
10.0000 mg | Freq: Two times a day (BID) | RECTAL | Status: DC | PRN
  Administered 2022-01-25: 10 mg via RECTAL
  Filled 2022-01-23: qty 1

## 2022-01-23 MED ORDER — MAGIC MOUTHWASH: 15.0000 mL | Freq: Four times a day (QID) | ORAL | Status: DC | PRN

## 2022-01-23 MED ORDER — SODIUM CHLORIDE 0.9% FLUSH: 3.0000 mL | INTRAVENOUS | Status: DC | PRN

## 2022-01-23 MED ORDER — ALBUTEROL SULFATE (2.5 MG/3ML) 0.083% IN NEBU
2.5000 mg | INHALATION_SOLUTION | Freq: Once | RESPIRATORY_TRACT | Status: AC
  Administered 2022-01-23: 2.5 mg via RESPIRATORY_TRACT
  Filled 2022-01-23: qty 3

## 2022-01-23 MED ORDER — LIP MEDEX EX OINT
TOPICAL_OINTMENT | Freq: Two times a day (BID) | CUTANEOUS | Status: DC
  Administered 2022-01-23: 75 via TOPICAL
  Administered 2022-01-24: 1 via TOPICAL
  Administered 2022-01-24: 75 via TOPICAL
  Administered 2022-01-25: 1 via TOPICAL
  Filled 2022-01-23 (×2): qty 7

## 2022-01-23 NOTE — Progress Notes (Signed)
Leah Mata [id number] Jun 17, 1945  CARE TEAM:  PCP: Corwin Levins, MD  Outpatient Care Team: Patient Care Team: Corwin Levins, MD as PCP - General Jodelle Red, MD as PCP - Cardiology (Cardiology) Jerene Bears, MD as Consulting Physician (Gynecology)  Inpatient Treatment Team: Treatment Team: Attending Provider: Stechschulte, Hyman Hopes, MD; Physical Therapist: Caswell Corwin, PT; Utilization Review: Helyn Numbers, RN; Registered Nurse: Wille Celeste, RN; Occupational Therapist: Shyrl Numbers, OT; Pharmacist: Herby Abraham, [facility]; Case Manager: Redmond Baseman, RN   Problem List:   Principal Problem:   Ventral hernia Active Problems:   Essential hypertension   COPD (chronic obstructive pulmonary disease) (HCC)   Pre-diabetes   Hyperlipidemia   Anxiety   Chronic anticoagulation   1 Day Post-Op  01/22/2022  Postoperative Diagnosis: INCISIONAL HERNIA BILATERAL INGUINAL HERNIAS    Surgical Procedure:  XI Robotic Assisted Incisional Hernia Repair With Mesh XI Robotic Assisted Bilateral Posterior Rectus Myofascial Release XI Robotic Assisted Bilateral Transversus Abdominis Myofascial Release XI Robotic Assisted Bilateral Inguinal Hernia Repair With Mesh   Operative Team Members:    Ivar Drape, MD - Primary   Findings:   Incisional hernia: Hernia Location: Ventral hernia location: Subxiphoid (M1), Epigastric (M2), Umbilical (M3), and Infraumbilical (M4) Hernia Size:  16cm tall x 11  cm wide  Mesh Size &Type:  30 cm tall x 28 cm wide Bard Soft Mesh Mesh Position: Sublay - Retromuscular Myofascial Releases:  Bilateral posterior rectus myofascial release Bilateral transversus abdominis release     Inguinal hernia Hernia Location: indirect right and indirect left inguinal hernias Mesh Size and type: Bard 3D max large right and Bard 3D Max large left meshes Mesh Fixation: 0 Silk Suture fixing the mesh to Coopers  ligament  Assessment  Ileus but stable  [facility] Stay = 1 days)  Plan:  -Try full liquids.  Do not advance until ileus resolved -Add MiraLAX for bowel regimen.  Simethicone as needed.  Continue Colace. -Wean off oxygen as tolerated -Inhalers as needed. -VTE prophylaxis- SCDs, etc -mobilize as tolerated to help recovery  Disposition:  Disposition:  The patient is from: Home  Anticipate discharge to:  Home  Anticipated Date of Discharge is:  October 30,2023    Barriers to discharge:  Pending Clinical improvement (more likely than not)  Patient currently is NOT MEDICALLY STABLE for discharge from the hospital from a surgery standpoint.      I reviewed nursing notes, last 24 h vitals and pain scores, last 48 h intake and output, last 24 h labs and trends, and last 24 h imaging results. I have reviewed this patient's available data, including medical history, events of note, test results, etc as part of my evaluation.  A significant portion of that time was spent in counseling.  Care during the described time interval was provided by me.  This care required moderate level of medical decision making.  01/23/2022    Subjective: (Chief complaint)  Family bedside.  Tolerating liquids.  Wants binder readjusted.  Burping but no flatus or bowel movements.  Objective:  Vital signs:  Vitals:   01/22/22 1729 01/22/22 2112 01/23/22 0151 01/23/22 0644  BP: 119/66 114/60 112/74 (!) 102/57  Pulse: 67 (!) 55 (!) 54 62  Resp: 17 16 19 15   Temp: (!) 97.5 F (36.4 C) 98 F (36.7 C) 98.3 F (36.8 C) 98.3 F (36.8 C)  TempSrc: Oral Oral Oral Oral  SpO2: 95% 97% 96% 97%  Weight:  Intake/Output   Yesterday:  10/27 0701 - 10/28 0700 In: 3265 [P.O.:150; I.V.:3015; IV Piggyback:100] Out: 565 [Urine:515; Blood:50] This shift:  Total I/O In: 200 [P.O.:200] Out: 200 [Urine:200]  Bowel function:  Flatus: No  BM:  No  Drain: (No drain)   Physical  Exam:  General: Pt awake/alert in no acute distress Eyes: PERRL, normal EOM.  Sclera clear.  No icterus Neuro: CN II-XII intact w/o focal sensory/motor deficits. Lymph: No head/neck/groin lymphadenopathy Psych:  No delerium/psychosis/paranoia.  Oriented x 4 HENT: Normocephalic, Mucus membranes moist.  No thrush Neck: Supple, No tracheal deviation.  No obvious thyromegaly Chest: No pain to chest wall compression.  Good respiratory excursion.  No audible wheezing CV:  Pulses intact.  Regular rhythm.  No major extremity edema MS: Normal AROM mjr joints.  No obvious deformity  Abdomen: Soft.  Moderately distended.  Mildly tender at incisions only.  No evidence of peritonitis.  No incarcerated hernias.  Ext:  No deformity.  No mjr edema.  No cyanosis Skin: No petechiae / purpurea.  No major sores.  Warm and dry    Results:   Cultures: No results found for this or any previous visit (from the past 720 hour(s)).  Labs: Results for orders placed or performed during the hospital encounter of 01/22/22 (from the past 48 hour(s))  Glucose, capillary     Status: Abnormal   Collection Time: 01/22/22  6:07 AM  Result Value Ref Range   Glucose-Capillary 138 (H) 70 - 99 mg/dL    Comment: Glucose reference range applies only to samples taken after fasting for at least 8 hours.  Type and screen [facility]     Status: None   Collection Time: 01/22/22  6:30 AM  Result Value Ref Range   ABO/RH(D) O POS    Antibody Screen NEG    Sample Expiration      01/25/2022,2359 Performed at [facility], [address], [place], [place] [address]   Basic metabolic panel     Status: Abnormal   Collection Time: 01/22/22  7:32 AM  Result Value Ref Range   Sodium 138 135 - 145 mmol/L   Potassium 4.2 3.5 - 5.1 mmol/L   Chloride 107 98 - 111 mmol/L   CO2 22 22 - 32 mmol/L   Glucose, Bld 122 (H) 70 - 99 mg/dL    Comment: Glucose reference range applies only to samples  taken after fasting for at least 8 hours.   BUN 20 8 - 23 mg/dL   Creatinine, Ser 3.84 0.44 - 1.00 mg/dL   Calcium 9.4 8.9 - 66.5 mg/dL   GFR, Estimated >99 >35 mL/min    Comment: (NOTE) Calculated using the CKD-EPI Creatinine Equation (2021)    Anion gap 9 5 - 15    Comment: Performed at [facility], [address], [place], [place] [address]  I-STAT 7, (LYTES, BLD GAS, ICA, H+H)     Status: Abnormal   Collection Time: 01/22/22  8:21 AM  Result Value Ref Range   pH, Arterial 7.341 (L) 7.35 - 7.45   pCO2 arterial 50.9 (H) 32 - 48 mmHg   pO2, Arterial 192 (H) 83 - 108 mmHg   Bicarbonate 27.8 20.0 - 28.0 mmol/L   TCO2 29 22 - 32 mmol/L   O2 Saturation 100 %   Acid-Base Excess 1.0 0.0 - 2.0 mmol/L   Sodium 136 135 - 145 mmol/L   Potassium 4.5 3.5 - 5.1 mmol/L  Calcium, Ion 1.36 1.15 - 1.40 mmol/L   HCT 33.0 (L) 36.0 - 46.0 %   Hemoglobin 11.2 (L) 12.0 - 15.0 g/dL   Patient temperature 85.4 C    Sample type ARTERIAL   ABO/Rh     Status: None   Collection Time: 01/22/22  8:40 AM  Result Value Ref Range   ABO/RH(D)      O POS Performed at [facility], [address] [place], [place] [address]   CBC     Status: Abnormal   Collection Time: 01/22/22  1:53 PM  Result Value Ref Range   WBC 18.3 (H) 4.0 - 10.5 K/uL   RBC 3.95 3.87 - 5.11 MIL/uL   Hemoglobin 12.2 12.0 - 15.0 g/dL   HCT 50.0 93.8 - 18.2 %   MCV 96.2 80.0 - 100.0 fL   MCH 30.9 26.0 - 34.0 pg   MCHC 32.1 30.0 - 36.0 g/dL   RDW 99.3 71.6 - 96.7 %   Platelets 230 150 - 400 K/uL   nRBC 0.0 0.0 - 0.2 %    Comment: Performed at [facility], [address], [place], [place] [address]  Creatinine, serum     Status: Abnormal   Collection Time: 01/22/22  1:53 PM  Result Value Ref Range   Creatinine, Ser 1.23 (H) 0.44 - 1.00 mg/dL   GFR, Estimated 46 (L) >60 mL/min    Comment: (NOTE) Calculated using the CKD-EPI Creatinine Equation (2021) Performed at [facility], [address], [place], [place] [address]   Basic metabolic panel     Status: Abnormal   Collection Time: 01/23/22  5:14 AM  Result Value Ref Range   Sodium 137 135 - 145 mmol/L   Potassium 4.9 3.5 - 5.1 mmol/L   Chloride 105 98 - 111 mmol/L   CO2 24 22 - 32 mmol/L   Glucose, Bld 132 (H) 70 - 99 mg/dL    Comment: Glucose reference range applies only to samples taken after fasting for at least 8 hours.   BUN 31 (H) 8 - 23 mg/dL   Creatinine, Ser 0.25 (H) 0.44 - 1.00 mg/dL   Calcium 9.5 8.9 - 85.2 mg/dL   GFR, Estimated 43 (L) >60 mL/min    Comment: (NOTE) Calculated using the CKD-EPI Creatinine Equation (2021)    Anion gap 8 5 - 15    Comment: Performed at [facility], [address] [place], [place] [address]  CBC     Status: Abnormal   Collection Time: 01/23/22  5:14 AM  Result Value Ref Range   WBC 13.3 (H) 4.0 - 10.5 K/uL   RBC 3.79 (L) 3.87 - 5.11 MIL/uL   Hemoglobin 11.7 (L) 12.0 - 15.0 g/dL   HCT 23.5 36.1 - 44.3 %   MCV 97.6 80.0 - 100.0 fL   MCH 30.9 26.0 - 34.0 pg   MCHC 31.6 30.0 - 36.0 g/dL   RDW 15.4 00.8 - 67.6 %   Platelets 180 150 - 400 K/uL   nRBC 0.0 0.0 - 0.2 %    Comment: Performed at [facility], [address] [place], [place] [address]    Imaging / Studies: No results found.  Medications / Allergies: per chart  Antibiotics: Anti-infectives (From admission, onward)    Start     Dose/Rate Route Frequency Ordered Stop   01/22/22 0600  ceFAZolin (ANCEF) IVPB 2g/100 mL premix        2  g 200 mL/hr over 30 Minutes Intravenous On call to O.R. 01/22/22 0555 01/22/22 0805         Note: Portions of this report may have been transcribed using voice recognition software. Every effort was made to ensure accuracy; however, inadvertent computerized transcription errors may be present.   Any transcriptional errors that result from this process are unintentional.    Adin Hector,  MD, FACS, MASCRS Esophageal, Gastrointestinal & Colorectal Surgery Robotic and Minimally Invasive Surgery  [facility]. [address] [place], [place] [address] [phone] Fax [phone] Main  CONTACT INFORMATION:  Weekday (9AM-5PM): Call [facility] main office at [phone]  Weeknight (5PM-9AM) or Weekend/Holiday: Check [email or web address] (password " [email or web address]") for General Surgery [facility] coverage  (Please, do not use SecureChat as it is not reliable communication to reach operating surgeons for immediate patient care given surgeries/outpatient duties/clinic/cross-coverage/off post-call which would lead to a delay in care.  Epic staff messaging available for outptient concerns, but may not be answered for 48 hours or more).     01/23/2022  12:43 PM

## 2022-01-23 NOTE — Progress Notes (Signed)
Patient noted to have worsening expiratory wheezes and  cough. Also SOB with ambulation. Tried to titrate O2 but desats to mid 80's. On call MD and RT notified. New order for CXR received. Breathing treatment given by RT with slight relief fron cough and SOB. Wil monitor closely.

## 2022-01-23 NOTE — Evaluation (Signed)
Physical Therapy Evaluation Patient Details Name: Leah Mata MRN: [id number] DOB: 06-05-1945 Today's Date: 01/23/2022  History of Present Illness  76 y.o. female who is s/p  incisional ventral  hernia repair and bil inguinal hernia repair 01/22/22. PMH:AAA repair in 2020, COPD,  HTN  Clinical Impression  Pt admitted with above diagnosis.  Pt is independent at her baseline, does no use O2, does not use AD to amb. Will continue efforts to progress pt to her baseline, do not think she will need f/u post acute   Pt currently with functional limitations due to the deficits listed below (see PT Problem List). Pt will benefit from skilled PT to increase their independence and safety with mobility to allow discharge to the venue listed below.          Recommendations for follow up therapy are one component of a multi-disciplinary discharge planning process, led by the attending physician.  Recommendations may be updated based on patient status, additional functional criteria and insurance authorization.  Follow Up Recommendations No PT follow up      Assistance Recommended at Discharge Intermittent Supervision/Assistance  Patient can return home with the following  Assist for transportation;Help with stairs or ramp for entrance    Equipment Recommendations None recommended by PT  Recommendations for Other Services       Functional Status Assessment Patient has had a recent decline in their functional status and demonstrates the ability to make significant improvements in function in a reasonable and predictable amount of time.     Precautions / Restrictions Precautions Precautions: Fall Precaution Comments: watch 02 Required Braces or Orthoses: Other Brace Other Brace: abdominal binder      Mobility  Bed Mobility   Bed Mobility: Rolling, Sidelying to Sit, Sit to Sidelying Rolling: Min assist Sidelying to sit: Min assist     Sit to sidelying: Min assist General bed mobility  comments: cues for log roll technique to minimize abdominal pain, assist to elevate trunk and lift LEs on to bed wtih return to supine    Transfers Overall transfer level: Needs assistance Equipment used: Rolling walker (2 wheels) Transfers: Sit to/from Stand Sit to Stand: Min guard, Supervision           General transfer comment: cues for hand placement    Ambulation/Gait Ambulation/Gait assistance: Min guard Gait Distance (Feet): 90 Feet Assistive device: Rolling walker (2 wheels) Gait Pattern/deviations: Step-through pattern       General Gait Details: cues for posture, breathing.  SpO2= 88-87% on RA, briefly dropped to 85% after amb. SpO2=92% on 2L at rest  Stairs            Wheelchair Mobility    Modified Rankin (Stroke Patients Only)       Balance                                             Pertinent Vitals/Pain Pain Assessment Pain Assessment: 0-10 Pain Score: 2  Pain Location: abdomen Pain Descriptors / Indicators: Discomfort Pain Intervention(s): Limited activity within patient's tolerance, Monitored during session, Premedicated before session    Home Living Family/patient expects to be discharged to:: Private residence Living Arrangements: Children;Other relatives Available Help at Discharge: Family;Available 24 hours/day Type of Home: Mobile home Home Access: Stairs to enter Entrance Stairs-Rails: Right;Left;Can reach both Entrance Stairs-Number of Steps: 5   Home Layout: One level  Home Equipment: BSC/3in1;Rolling Walker (2 wheels);Adaptive equipment      Prior Function Prior Level of Function : Independent/Modified Independent             Mobility Comments: walks without AD ADLs Comments: does her own ADL/IADLs     Hand Dominance   Dominant Hand: Right    Extremity/Trunk Assessment   Upper Extremity Assessment Upper Extremity Assessment: Overall WFL for tasks assessed;Defer to OT evaluation    Lower  Extremity Assessment Lower Extremity Assessment: Overall WFL for tasks assessed    Cervical / Trunk Assessment Cervical / Trunk Assessment: Other exceptions (s/p abdominal sx)  Communication   Communication: HOH  Cognition Arousal/Alertness: Awake/alert Behavior During Therapy: WFL for tasks assessed/performed Overall Cognitive Status: Within Functional Limits for tasks assessed                                          General Comments      Exercises     Assessment/Plan    PT Assessment Patient needs continued PT services  PT Problem List Decreased strength;Decreased mobility;Decreased activity tolerance;Decreased knowledge of use of DME;Decreased balance       PT Treatment Interventions DME instruction;Therapeutic exercise;Gait training;Stair training;Functional mobility training;Therapeutic activities;Patient/family education    PT Goals (Current goals can be found in the Care Plan section)  Acute Rehab PT Goals PT Goal Formulation: With patient Time For Goal Achievement: 02/05/22 Potential to Achieve Goals: Good    Frequency Min 3X/week     Co-evaluation               AM-PAC PT "6 Clicks" Mobility  Outcome Measure Help needed turning from your back to your side while in a flat bed without using bedrails?: A Little Help needed moving from lying on your back to sitting on the side of a flat bed without using bedrails?: A Little Help needed moving to and from a bed to a chair (including a wheelchair)?: A Little Help needed standing up from a chair using your arms (e.g., wheelchair or bedside chair)?: A Little Help needed to walk in hospital room?: A Little Help needed climbing 3-5 steps with a railing? : A Little 6 Click Score: 18    End of Session Equipment Utilized During Treatment: Gait belt Activity Tolerance: Patient tolerated treatment well Patient left: with call bell/phone within reach;in bed;with bed alarm set;with family/visitor  present Nurse Communication: Mobility status PT Visit Diagnosis: Other abnormalities of gait and mobility (R26.89);Difficulty in walking, not elsewhere classified (R26.2)    Time: 1610-9604 PT Time Calculation (min) (ACUTE ONLY): 13 min   Charges:   PT Evaluation $PT Eval Low Complexity: [place], PT  Acute Rehab Dept [facility]) [phone]  [facility] Weekend Pager [facility] only)  [phone]  01/23/2022   [facility] 01/23/2022, 5:14 PM

## 2022-01-23 NOTE — Evaluation (Signed)
Occupational Therapy Evaluation Patient Details Name: Leah Mata MRN: [id number] DOB: [birth year] Today's Date: 01/23/2022   History of Present Illness 76 y.o. female who is s/p  incisional ventral  hernia repair and bil inguinal hernia repair 01/22/22. PMH:AAA repair in 2020, COPD,  HTN   Clinical Impression   Pt is typically independent. She endorses getting short of breath with walking to the mailbox. Pt presents with minimal abdominal pain and impaired balance. She does not typically wear socks. Pt has a Sports administrator at home. Recommend a long handled bath sponge. Pt requires min assist for bed mobility and supervision for OOB, used a RW this visit. Pt with 02 sat of 85% on RA, replaced 2L with pt rebounding to 94%. She is likely to progress well and not need post acute OT. Family asking about nebulizer treatments, notified RN     Recommendations for follow up therapy are one component of a multi-disciplinary discharge planning process, led by the attending physician.  Recommendations may be updated based on patient status, additional functional criteria and insurance authorization.   Follow Up Recommendations  No OT follow up    Assistance Recommended at Discharge Intermittent Supervision/Assistance  Patient can return home with the following A little help with bathing/dressing/bathroom;Assistance with cooking/housework;Assist for transportation    Functional Status Assessment  Patient has had a recent decline in their functional status and demonstrates the ability to make significant improvements in function in a reasonable and predictable amount of time.  Equipment Recommendations  None recommended by OT    Recommendations for Other Services       Precautions / Restrictions Precautions Precautions: Fall Precaution Comments: watch 02 Required Braces or Orthoses: Other Brace (abdominal binder)      Mobility Bed Mobility Overal bed mobility: Needs Assistance Bed Mobility:  Rolling, Sidelying to Sit, Sit to Sidelying Rolling: Min assist Sidelying to sit: Min assist     Sit to sidelying: Min guard General bed mobility comments: cues for log roll technique to minimize abdominal pain    Transfers Overall transfer level: Needs assistance Equipment used: Rolling walker (2 wheels) Transfers: Sit to/from Stand Sit to Stand: Supervision           General transfer comment: cues for hand placement      Balance Overall balance assessment: Needs assistance Sitting-balance support: Feet supported Sitting balance-[name]: Fair Sitting balance - Comments: stabilizes with B UEs intermittently     Standing balance-[name]: Fair                             ADL either performed or assessed with clinical judgement   ADL Overall ADL's : Needs assistance/impaired Eating/Feeding: Independent   Grooming: Standing;Supervision/safety   Upper Body Bathing: Minimal assistance;Sitting   Lower Body Bathing: Minimal assistance;Sit to/from stand Lower Body Bathing Details (indicate cue type and reason): recommended long handled bath sponge Upper Body Dressing : Set up;Sitting   Lower Body Dressing: Minimal assistance;Sit to/from stand Lower Body Dressing Details (indicate cue type and reason): began educating pt in use of reacher, pt does not wear socks Toilet Transfer: Supervision/safety;Ambulation;Rolling walker (2 wheels)   Toileting- Clothing Manipulation and Hygiene: Supervision/safety;Sit to/from stand       Functional mobility during ADLs: Supervision/safety;Rolling walker (2 wheels)       Vision Ability to See in Adequate Light: 0 Adequate Patient Visual Report: No change from baseline       Perception  Praxis      Pertinent Vitals/Pain Pain Assessment Pain Assessment: 0-10 Pain Score: 2  Pain Location: abdomen Pain Descriptors / Indicators: Discomfort Pain Intervention(s): Monitored during session     Hand  Dominance Right   Extremity/Trunk Assessment Upper Extremity Assessment Upper Extremity Assessment: Overall WFL for tasks assessed   Lower Extremity Assessment Lower Extremity Assessment: Defer to PT evaluation   Cervical / Trunk Assessment Cervical / Trunk Assessment: Other exceptions (s/p abdominal sx)   Communication Communication Communication: HOH   Cognition Arousal/Alertness: Awake/alert Behavior During Therapy: WFL for tasks assessed/performed Overall Cognitive Status: Within Functional Limits for tasks assessed                                       General Comments       Exercises     Shoulder Instructions      Home Living Family/patient expects to be discharged to:: Private residence Living Arrangements: Children;Other relatives Available Help at Discharge: Family;Available 24 hours/day Type of Home: Mobile home Home Access: Stairs to enter Entrance Stairs-Number of Steps: 5 Entrance Stairs-Rails: Right;Left;Can reach both Home Layout: One level     Bathroom Shower/Tub: Chief Strategy Officer: Standard     Home Equipment: Teacher, English as a foreign language (2 wheels);Adaptive equipment Adaptive Equipment: Reacher        Prior Functioning/Environment Prior Level of Function : Independent/Modified Independent             Mobility Comments: walks without AD ADLs Comments: does her own ADL/IADLs        OT Problem List: Impaired balance (sitting and/or standing);Decreased strength;Obesity      OT Treatment/Interventions: Self-care/ADL training;DME and/or AE instruction;Patient/family education;Balance training    OT Goals(Current goals can be found in the care plan section) Acute Rehab OT Goals OT Goal Formulation: With patient Time For Goal Achievement: 02/05/22 Potential to Achieve Goals: Good ADL Goals Pt Will Perform Grooming: (P) Independently;standing Pt Will Perform Lower Body Dressing: (P) with modified  independence;with adaptive equipment;sit to/from stand Pt Will Transfer to Toilet: (P) Independently;ambulating;regular height toilet Pt Will Perform Toileting - Clothing Manipulation and hygiene: (P) Independently;sit to/from stand Additional ADL Goal #1: (P) Pt will perform bed mobility modified independently in preparation for ADLs.  OT Frequency: Min 2X/week    Co-evaluation              AM-PAC OT "6 Clicks" Daily Activity     Outcome Measure Help from another person eating meals?: None Help from another person taking care of personal grooming?: A Little Help from another person toileting, which includes using toliet, bedpan, or urinal?: A Little Help from another person bathing (including washing, rinsing, drying)?: A Little Help from another person to put on and taking off regular upper body clothing?: A Little Help from another person to put on and taking off regular lower body clothing?: A Little 6 Click Score: 19   End of Session Equipment Utilized During Treatment: Rolling walker (2 wheels);Other (comment) (abdominal binder) Nurse Communication: Other (comment) (sp02 drops to 85% on RA)  Activity Tolerance: Patient tolerated treatment well Patient left: in bed;with call bell/phone within reach;with family/visitor present  OT Visit Diagnosis: Unsteadiness on feet (R26.81);Other abnormalities of gait and mobility (R26.89);Muscle weakness (generalized) (M62.81)                Time: 2831-5176 OT Time Calculation (min): 18 min Charges:  OT General  Charges $OT Visit: 1 Visit OT Evaluation $OT Eval Moderate Complexity: [place], OTR/L Acute Rehabilitation Services Office: [phone]   Malka So 01/23/2022, 2:44 PM

## 2022-01-24 DIAGNOSIS — N182 Chronic kidney disease, stage 2 (mild): Secondary | ICD-10-CM

## 2022-01-24 DIAGNOSIS — J9601 Acute respiratory failure with hypoxia: Secondary | ICD-10-CM

## 2022-01-24 DIAGNOSIS — J449 Chronic obstructive pulmonary disease, unspecified: Secondary | ICD-10-CM | POA: Diagnosis not present

## 2022-01-24 LAB — BASIC METABOLIC PANEL
Anion gap: 6 (ref 5–15)
BUN: 29 mg/dL — ABNORMAL HIGH (ref 8–23)
CO2: 25 mmol/L (ref 22–32)
Calcium: 9.8 mg/dL (ref 8.9–10.3)
Chloride: 102 mmol/L (ref 98–111)
Creatinine, Ser: 1.28 mg/dL — ABNORMAL HIGH (ref 0.44–1.00)
GFR, Estimated: 44 mL/min — ABNORMAL LOW (ref >60)
Glucose, Bld: 126 mg/dL — ABNORMAL HIGH (ref 70–99)
Potassium: 4.9 mmol/L (ref 3.5–5.1)
Sodium: 133 mmol/L — ABNORMAL LOW (ref 135–145)

## 2022-01-24 LAB — CBC
HCT: 32.7 % — ABNORMAL LOW (ref 36.0–46.0)
Hemoglobin: 10.2 g/dL — ABNORMAL LOW (ref 12.0–15.0)
MCH: 31.2 pg (ref 26.0–34.0)
MCHC: 31.2 g/dL (ref 30.0–36.0)
MCV: 100 fL (ref 80.0–100.0)
Platelets: 164 10*3/uL (ref 150–400)
RBC: 3.27 MIL/uL — ABNORMAL LOW (ref 3.87–5.11)
RDW: 13.1 % (ref 11.5–15.5)
WBC: 12.9 10*3/uL — ABNORMAL HIGH (ref 4.0–10.5)
nRBC: 0 % (ref 0.0–0.2)

## 2022-01-24 MED ORDER — BISACODYL 10 MG RE SUPP
10.0000 mg | Freq: Every day | RECTAL | Status: DC
  Administered 2022-01-24: 10 mg via RECTAL
  Filled 2022-01-24 (×3): qty 1

## 2022-01-24 MED ORDER — IPRATROPIUM-ALBUTEROL 0.5-2.5 (3) MG/3ML IN SOLN
3.0000 mL | Freq: Two times a day (BID) | RESPIRATORY_TRACT | Status: DC
  Administered 2022-01-24 (×2): 3 mL via RESPIRATORY_TRACT
  Filled 2022-01-24 (×2): qty 3

## 2022-01-24 MED ORDER — UMECLIDINIUM BROMIDE 62.5 MCG/ACT IN AEPB
1.0000 | INHALATION_SPRAY | Freq: Every day | RESPIRATORY_TRACT | Status: DC
  Administered 2022-01-25 – 2022-01-27 (×3): 1 via RESPIRATORY_TRACT
  Filled 2022-01-24: qty 7

## 2022-01-24 MED ORDER — IPRATROPIUM-ALBUTEROL 0.5-2.5 (3) MG/3ML IN SOLN: 3.0000 mL | RESPIRATORY_TRACT | Status: DC | PRN

## 2022-01-24 MED ORDER — GABAPENTIN 100 MG PO CAPS
200.0000 mg | ORAL_CAPSULE | Freq: Three times a day (TID) | ORAL | Status: DC
  Administered 2022-01-24 – 2022-01-27 (×8): 200 mg via ORAL
  Filled 2022-01-24 (×10): qty 2

## 2022-01-24 NOTE — Progress Notes (Signed)
Physical Therapy Treatment Patient Details Name: Leah Mata MRN: [id number] DOB: 1945/10/24 Today's Date: 01/24/2022   History of Present Illness 76 y.o. female who is s/p  incisional ventral  hernia repair and bil inguinal hernia repair 01/22/22. PMH:AAA repair in 2020, COPD,  HTN    PT Comments    Pt activity tolerance improving/incr gait distance. Continues to require supplemental O2 to maintain adequate O2 saturations. See amb O2 sat note.  Will continue to work with pt in acute setting to maximize independence. Pt has RW at home and states she has family assist as needed.  Recommendations for follow up therapy are one component of a multi-disciplinary discharge planning process, led by the attending physician.  Recommendations may be updated based on patient status, additional functional criteria and insurance authorization.  Follow Up Recommendations  No PT follow up     Assistance Recommended at Discharge Intermittent Supervision/Assistance  Patient can return home with the following Assist for transportation;Help with stairs or ramp for entrance   Equipment Recommendations  None recommended by PT    Recommendations for Other Services       Precautions / Restrictions Precautions Precautions: Fall Precaution Comments: monitor O2 Required Braces or Orthoses: Other Brace Other Brace: abdominal binder     Mobility  Bed Mobility Overal bed mobility: Needs Assistance Bed Mobility: Sit to Sidelying         Sit to sidelying: Min guard, HOB elevated General bed mobility comments: cues to transition to s/l, HOB at ~ 30 degrees, pt able to elevate LEs with incr time adn reposition self in bed with supervision    Transfers Overall transfer level: Needs assistance Equipment used: Rolling walker (2 wheels) Transfers: Sit to/from Stand Sit to Stand: Min guard, Supervision           General transfer comment: cues for hand placement, incr time     Ambulation/Gait Ambulation/Gait assistance: Supervision, Min guard Gait Distance (Feet): 110 Feet Assistive device: Rolling walker (2 wheels) Gait Pattern/deviations: Step-through pattern       General Gait Details: cues for RW position and safety especially with turns. SpO2 decr to 85% on RA, 2L to allow >93% saturation during activity   Stairs             Wheelchair Mobility    Modified Rankin (Stroke Patients Only)       Balance   Sitting-balance support: Feet supported Sitting balance-[name]: Good       Standing balance-[name]: Fair                              Cognition Arousal/Alertness: Awake/alert Behavior During Therapy: WFL for tasks assessed/performed Overall Cognitive Status: Within Functional Limits for tasks assessed                                          Exercises      General Comments        Pertinent Vitals/Pain Pain Assessment Pain Assessment: No/denies pain    Home Living                          Prior Function            PT Goals (current goals can now be found in the care plan section) Acute Rehab PT  Goals PT Goal Formulation: With patient Time For Goal Achievement: 02/05/22 Potential to Achieve Goals: Good Progress towards PT goals: Progressing toward goals    Frequency    Min 3X/week      PT Plan Current plan remains appropriate    Co-evaluation              AM-PAC PT "6 Clicks" Mobility   Outcome Measure  Help needed turning from your back to your side while in a flat bed without using bedrails?: A Little Help needed moving from lying on your back to sitting on the side of a flat bed without using bedrails?: A Little Help needed moving to and from a bed to a chair (including a wheelchair)?: A Little Help needed standing up from a chair using your arms (e.g., wheelchair or bedside chair)?: A Little Help needed to walk in hospital room?: A  Little Help needed climbing 3-5 steps with a railing? : A Little 6 Click Score: 18    End of Session Equipment Utilized During Treatment: Other (comment);Oxygen (abd binder) Activity Tolerance: Patient tolerated treatment well Patient left: in bed;with call bell/phone within reach;with bed alarm set   PT Visit Diagnosis: Other abnormalities of gait and mobility (R26.89);Difficulty in walking, not elsewhere classified (R26.2)     Time: [id number] PT Time Calculation (min) (ACUTE ONLY): 18 min  Charges:  $Gait Training: 8-22 mins                     Baxter Flattery, PT  Acute Rehab Dept [facility]) [phone]  [facility] Weekend Pager [facility] only)  [phone]  01/24/2022    [facility] 01/24/2022, 12:30 PM

## 2022-01-24 NOTE — Progress Notes (Signed)
Patient ID: Leah Mata, female   DOB: 09-Jan-1946, 76 y.o.   MRN: [id number]  Patient oxygen saturation on room air at rest 94%  Patient oxygen saturation on room air during ambulation 85%  Haydee Salter, RN

## 2022-01-24 NOTE — Progress Notes (Signed)
PT NOTE--  SATURATION QUALIFICATIONS:  (This note is used to comply with regulatory documentation for home oxygen)  Patient Saturations on Room Air at Rest = 87%  Patient Saturations on Room Air while Ambulating = 85%  Patient Saturations on 2 Liters of oxygen while Ambulating = 93%  Please briefly explain why patient needs home oxygen: pt requires O2 to maintain adequate O2 saturations during mobility  Baxter Flattery, PT  Acute Rehab Dept [facility]) [phone]  [facility] Weekend Pager ([place] only)  [phone]  01/24/2022

## 2022-01-24 NOTE — Consult Note (Signed)
Initial Consultation Note   Patient: Leah Mata MWN:[id number] DOB: Jul 19, 1945 PCP: Biagio Borg, MD DOA: 01/22/2022 DOS: the patient was seen and examined on 01/24/2022 Primary service: Felicie Morn, MD  Referring physician: Michael Boston, MD Reason for consult: Hypoxia  Assessment/Plan:  Acute respiratory failure with hypoxia Persistent since post-extubation. Patient appears to have a history of exertional dyspnea which may indicate a more chronic issue. Chest x-ray without active disease. -Duoneb BID and q4 hours prn; patient can continue Xopenex on discharge -Wean to room air as able -Daily ambulatory pulse ox checks -Incentive spirometer  COPD Patient is managed on Xopenex as an outpatient for which she has not used in years secondary to running out of medication. Patient does not follow-up with pulmonology. No evidence of exacerbation at this point. -Incruse Ellipta  Ventral hernia S/p repair. Management by primary.  Hypertension -Agree with continuing home amlodipine, lisinopril and metoprolol tartrate  Depression -Agree with continuing home Ceelxa  Hyperlipidemia -Agree with continuing pravastatin  CKD stage II Noted. Baseline creatinine appears to be around 1.1. Increased from baseline to a peak of 1.3 without meeting criteria for AKI.  Pre-diabetes Not on medication management. Recommend continued follow-up with PCP.    TR[facility] will continue to follow the patient.  HPI: Leah Mata is a 76 y.o. female with past medical history of COPD, hypertension, prediabetes. Patient presented for hernia repair. Hospitalist service consulted for persistent hypoxia post-extubation from surgery. Patient with mostly exertional dyspnea, which is present chronically, and no chest pain. No current nausea/vomiting but did have vomiting prior to surgery. No history of aspiration recently. Patient reports chronic productive cough that has not changed from baseline. She is  prescribed Xopenex but has not had the medication for years; she does not use any other inhaler.  Review of Systems: As mentioned in the history of present illness. All other systems reviewed and are negative. 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   Procedure: ANEURYSM ABDOMINAL AORTIC REPAIR WITH HEMASHIELD GOLD VASCULAR GRAFT 22X11 mm;  Surgeon: Rosetta Posner, MD;  Location: [facility] OR;  Service: Vascular;  Laterality: N/A;   CARDIAC CATHETERIZATION  03/29/2001   negative   DILATION AND CURETTAGE OF UTERUS  1985   TONSILLECTOMY Bilateral    age 79   [place] N/A 01/22/2022   Procedure: XI ROBOTIC [place];  Surgeon: Felicie Morn, MD;  Location: [facility] ORS;  Service: General;  Laterality: N/A;   Social History:  reports that she quit smoking about 3 years ago. Her smoking use included cigarettes. She has a 15.00 pack-year smoking history. She has never used smokeless tobacco. She reports that she does not drink alcohol and does not use drugs.  Allergies  Allergen Reactions   Hydrochlorothiazide Other (See Comments)    unknown    Family History  Problem Relation Age of Onset   Coronary artery disease Other        female 1st degree relative <60 and female 1st degree relative <50   Diabetes Father    Heart attack Father    Diabetes Mother    Hypertension Mother  Congestive Heart Failure Mother    Colon cancer Neg Hx    Breast cancer Neg Hx     Prior to Admission medications   Medication Sig Start Date End Date Taking? Authorizing Provider  acetaminophen (TYLENOL) 500 MG tablet Take 1,000 mg by mouth  every 6 (six) hours as needed for mild pain.   Yes [provider]  amLODipine (NORVASC) 10 MG tablet TAKE 1 TABLET EVERY DAY 12/22/21  Yes Corwin Levins, MD  apixaban (ELIQUIS) 5 MG TABS tablet Take 1 tablet (5 mg total) by mouth 2 (two) times daily. 07/26/18  Yes Jodelle Red, MD  cholecalciferol (VITAMIN D3) 25 MCG (1000 UNIT) tablet Take 1,000 Units by mouth daily.   Yes [provider]  citalopram (CELEXA) 10 MG tablet TAKE 1 TABLET EVERY DAY 04/27/21  Yes Corwin Levins, MD  lisinopril (ZESTRIL) 20 MG tablet TAKE 1 TABLET TWICE DAILY 03/27/21  Yes Jodelle Red, MD  metoprolol tartrate (LOPRESSOR) 50 MG tablet TAKE 1 TABLET TWICE DAILY 06/26/21  Yes Corwin Levins, MD  oxyCODONE-acetaminophen (PERCOCET) 5-325 MG tablet Take 1 tablet by mouth every 4 (four) hours as needed for severe pain. 01/22/22 01/22/23 Yes Stechschulte, Hyman Hopes, MD  pravastatin (PRAVACHOL) 20 MG tablet TAKE 1 TABLET EVERY DAY 04/27/21  Yes Corwin Levins, MD  ALPRAZolam Prudy Feeler) 0.25 MG tablet TAKE 1 TABLET BY MOUTH TWICE A DAY AS NEEDED FOR ANXIETY Patient not taking: Reported on 10/27/2021 10/28/20   Corwin Levins, MD  levalbuterol Pauline Aus) 1.25 MG/0.5ML nebulizer solution Take 1.25 mg by nebulization every 6 (six) hours as needed for wheezing or shortness of breath. Patient not taking: Reported on 10/27/2021 06/16/18   Corwin Levins, MD    Physical Exam: Vitals:   01/23/22 1335 01/23/22 1517 01/23/22 2052 01/24/22 0551  BP: 124/71  (!) 132/59 110/66  Pulse: 67  65 62  Resp: 12  16 18   Temp: 98.6 F (37 C)  99.2 F (37.3 C) 98.2 F (36.8 C)  TempSrc: Oral  Oral Oral  SpO2: 96% 98% 97% 96%  Weight:       General exam: Appears calm and comfortable Respiratory system: Mild end-expiratory wheezing diffusely. Respiratory effort normal at rest. Cardiovascular system: S1 & S2 heard, RRR.  Gastrointestinal system: Abdomen is nondistended, soft and nontender. Abdominal binder present. Central  nervous system: Alert and oriented. No focal neurological deficits. Musculoskeletal: No calf tenderness Psychiatry: Judgement and insight appear normal. Mood & affect appropriate.   Data Reviewed:  There are no new results to review at this time.   Family Communication: None at bedside   Author: , MD 01/24/2022 9:22 AM  For on call review www.01/26/2022.

## 2022-01-24 NOTE — Progress Notes (Signed)
Leah Mata [id number] 1945/07/25  CARE TEAM:  PCP: Biagio Borg, MD  Outpatient Care Team: Patient Care Team: Biagio Borg, MD as PCP - General Buford Dresser, MD as PCP - Cardiology (Cardiology) Megan Salon, MD as Consulting Physician (Gynecology)  Inpatient Treatment Team: Treatment Team: Attending Provider: Stechschulte, Nickola Major, MD; Physical Therapist: Neil Crouch, PT; Technician: Jeralyn Ruths, NT; Utilization Review: Antionette Char, RN; Registered Nurse: Haydee Salter, RN   Problem List:   Principal Problem:   Ventral hernia Active Problems:   Essential hypertension   COPD (chronic obstructive pulmonary disease) (HCC)   INSOMNIA-SLEEP DISORDER-UNSPEC   Pre-diabetes   Hyperlipidemia   Constipation   Anxiety   Chronic anticoagulation   CKD (chronic kidney disease) stage 2, GFR 60-89 ml/min   2 Days Post-Op  01/22/2022  Postoperative Diagnosis: INCISIONAL HERNIA BILATERAL INGUINAL HERNIAS    Surgical Procedure:  XI Robotic Assisted Incisional Hernia Repair With Mesh XI Robotic Assisted Bilateral Posterior Rectus Myofascial Release XI Robotic Assisted Bilateral Transversus Abdominis Myofascial Release XI Robotic Assisted Bilateral Inguinal Hernia Repair With Mesh   Operative Team Members:    Louanna Raw, MD - Primary   Findings:   Incisional hernia: Hernia Location: Ventral hernia location: Subxiphoid (M1), Epigastric (M2), Umbilical (M3), and Infraumbilical (M4) Hernia Size:  16cm tall x 11  cm wide  Mesh Size &Type:  30 cm tall x 28 cm wide Bard Soft Mesh Mesh Position: Sublay - Retromuscular Myofascial Releases:  Bilateral posterior rectus myofascial release Bilateral transversus abdominis release     Inguinal hernia Hernia Location: indirect right and indirect left inguinal hernias Mesh Size and type: Bard 3D max large right and Bard 3D Max large left meshes Mesh Fixation: 0 Silk Suture fixing the mesh to  Coopers ligament  Assessment  Ileus but stable  Hypoxia peristent  [facility] Stay = 2 days)  Plan:  -With having some flatus intolerance of full liquids, try soft diet.    -Add MiraLAX for bowel regimen.  Daily Dulcolax suppository.  Try not to force things for now.  Simethicone as needed.  Continue Colace.  -Persistent postoperative hypoxia with no evidence of any pneumonia or major lung loss on chest x-ray.  We will ask internal medicine to help evaluate since patient is not taking inhalers at home and claims no one is ordered him for and that her in a while.  I suspect she would benefit from that.  See what they think if some baseline inhalers or continue as needed's.  Try to keep on the dry side but holding IV fluids.  Do not know if Lasix would be appropriate  -VTE prophylaxis- SCDs, enoxaparin.  If hemoglobin stable and improved, most likely can resume DOAC apixaban tomorrow.  -mobilize as tolerated to help recovery.  By physical therapy and cleared.  Son in room.  Hopefully better.  Disposition:  Disposition:  The patient is from: Home  Anticipate discharge to:  Home  Anticipated Date of Discharge is:  October 30,2023    Barriers to discharge:  Pending Clinical improvement (more likely than not)  Patient currently is NOT MEDICALLY STABLE for discharge from the hospital from a surgery standpoint.      I reviewed nursing notes, last 24 h vitals and pain scores, last 48 h intake and output, last 24 h labs and trends, and last 24 h imaging results. I have reviewed this patient's available data, including medical history, events of note, test results, etc  as part of my evaluation.  A significant portion of that time was spent in counseling.  Care during the described time interval was provided by me.  This care required moderate level of medical decision making.  01/24/2022    Subjective: (Chief complaint)  Son at bedside.  Some concerns of hypoxia and cannot wean  off oxygen.  Chest x-ray underwhelming.  Pain much less today.  Seen by physical therapy.  They did not feel like she needed home health.  Tolerating full liquids.  Started to have some flatus but no bowel movement yet.  Objective:  Vital signs:  Vitals:   01/23/22 1335 01/23/22 1517 01/23/22 2052 01/24/22 0551  BP: 124/71  (!) 132/59 110/66  Pulse: 67  65 62  Resp: 12  16 18   Temp: 98.6 F (37 C)  99.2 F (37.3 C) 98.2 F (36.8 C)  TempSrc: Oral  Oral Oral  SpO2: 96% 98% 97% 96%  Weight:           Intake/Output   Yesterday:  10/28 0701 - 10/29 0700 In: 980 [P.O.:980] Out: 1200 [Urine:1200] This shift:  No intake/output data recorded.  Bowel function:  Flatus: No  BM:  No  Drain: (No drain)   Physical Exam:  General: Pt awake/alert in no acute distress Eyes: PERRL, normal EOM.  Sclera clear.  No icterus Neuro: CN II-XII intact w/o focal sensory/motor deficits. Lymph: No head/neck/groin lymphadenopathy Psych:  No delerium/psychosis/paranoia.  Oriented x 4 HENT: Normocephalic, Mucus membranes moist.  No thrush Neck: Supple, No tracheal deviation.  No obvious thyromegaly Chest: No pain to chest wall compression.  Good respiratory excursion.  No audible wheezing.  No conversational dyspnea. CV:  Pulses intact.  Regular rhythm.  No major extremity edema MS: Normal AROM mjr joints.  No obvious deformity  Abdomen: Soft.  Mildy distended.  Mildly tender at incisions only.  No evidence of peritonitis.  No incarcerated hernias.  Ext:  No deformity.  No mjr edema.  No cyanosis Skin: No petechiae / purpurea.  No major sores.  Warm and dry    Results:   Cultures: No results found for this or any previous visit (from the past 720 hour(s)).  Labs: Results for orders placed or performed during the hospital encounter of 01/22/22 (from the past 48 hour(s))  I-STAT 7, (LYTES, BLD GAS, ICA, H+H)     Status: Abnormal   Collection Time: 01/22/22  8:21 AM  Result  Value Ref Range   pH, Arterial 7.341 (L) 7.35 - 7.45   pCO2 arterial 50.9 (H) 32 - 48 mmHg   pO2, Arterial 192 (H) 83 - 108 mmHg   Bicarbonate 27.8 20.0 - 28.0 mmol/L   TCO2 29 22 - 32 mmol/L   O2 Saturation 100 %   Acid-Base Excess 1.0 0.0 - 2.0 mmol/L   Sodium 136 135 - 145 mmol/L   Potassium 4.5 3.5 - 5.1 mmol/L   Calcium, Ion 1.36 1.15 - 1.40 mmol/L   HCT 33.0 (L) 36.0 - 46.0 %   Hemoglobin 11.2 (L) 12.0 - 15.0 g/dL   Patient temperature 36.0 C    Sample type ARTERIAL   ABO/Rh     Status: None   Collection Time: 01/22/22  8:40 AM  Result Value Ref Range   ABO/RH(D)      O POS Performed at [facility], [place] [address] [place], [place] [address]   CBC     Status: Abnormal   Collection Time: 01/22/22  1:53  PM  Result Value Ref Range   WBC 18.3 (H) 4.0 - 10.5 K/uL   RBC 3.95 3.87 - 5.11 MIL/uL   Hemoglobin 12.2 12.0 - 15.0 g/dL   HCT 38.0 36.0 - 46.0 %   MCV 96.2 80.0 - 100.0 fL   MCH 30.9 26.0 - 34.0 pg   MCHC 32.1 30.0 - 36.0 g/dL   RDW 12.3 11.5 - 15.5 %   Platelets 230 150 - 400 K/uL   nRBC 0.0 0.0 - 0.2 %    Comment: Performed at [facility], [place] [address] [place], [place] [address]  Creatinine, serum     Status: Abnormal   Collection Time: 01/22/22  1:53 PM  Result Value Ref Range   Creatinine, Ser 1.23 (H) 0.44 - 1.00 mg/dL   GFR, Estimated 46 (L) >60 mL/min    Comment: (NOTE) Calculated using the CKD-EPI Creatinine Equation (2021) Performed at [facility], [place] [address] [place], [place] [id number]   Basic metabolic panel     Status: Abnormal   Collection Time: 01/23/22  5:14 AM  Result Value Ref Range   Sodium 137 135 - 145 mmol/L   Potassium 4.9 3.5 - 5.1 mmol/L   Chloride 105 98 - 111 mmol/L   CO2 24 22 - 32 mmol/L   Glucose, Bld 132 (H) 70 - 99 mg/dL    Comment: Glucose reference range applies only to samples taken after fasting for at least 8 hours.   BUN 31 (H) 8 - 23 mg/dL    Creatinine, Ser 1.30 (H) 0.44 - 1.00 mg/dL   Calcium 9.5 8.9 - 10.3 mg/dL   GFR, Estimated 43 (L) >60 mL/min    Comment: (NOTE) Calculated using the CKD-EPI Creatinine Equation (2021)    Anion gap 8 5 - 15    Comment: Performed at [facility], [place] [address], [place], [place] [address]  CBC     Status: Abnormal   Collection Time: 01/23/22  5:14 AM  Result Value Ref Range   WBC 13.3 (H) 4.0 - 10.5 K/uL   RBC 3.79 (L) 3.87 - 5.11 MIL/uL   Hemoglobin 11.7 (L) 12.0 - 15.0 g/dL   HCT 37.0 36.0 - 46.0 %   MCV 97.6 80.0 - 100.0 fL   MCH 30.9 26.0 - 34.0 pg   MCHC 31.6 30.0 - 36.0 g/dL   RDW 12.7 11.5 - 15.5 %   Platelets 180 150 - 400 K/uL   nRBC 0.0 0.0 - 0.2 %    Comment: Performed at [facility], [place] [address], [place], [place] [id number]  Basic metabolic panel     Status: Abnormal   Collection Time: 01/24/22  4:56 AM  Result Value Ref Range   Sodium 133 (L) 135 - 145 mmol/L   Potassium 4.9 3.5 - 5.1 mmol/L   Chloride 102 98 - 111 mmol/L   CO2 25 22 - 32 mmol/L   Glucose, Bld 126 (H) 70 - 99 mg/dL    Comment: Glucose reference range applies only to samples taken after fasting for at least 8 hours.   BUN 29 (H) 8 - 23 mg/dL   Creatinine, Ser 1.28 (H) 0.44 - 1.00 mg/dL   Calcium 9.8 8.9 - 10.3 mg/dL   GFR, Estimated 44 (L) >60 mL/min    Comment: (NOTE) Calculated using the CKD-EPI Creatinine Equation (2021)    Anion gap 6 5 - 15    Comment: Performed at [facility],  [place] [address], [place], [place] [address]  CBC     Status: Abnormal   Collection Time: 01/24/22  4:56 AM  Result Value Ref Range   WBC 12.9 (H) 4.0 - 10.5 K/uL   RBC 3.27 (L) 3.87 - 5.11 MIL/uL   Hemoglobin 10.2 (L) 12.0 - 15.0 g/dL   HCT 32.7 (L) 36.0 - 46.0 %   MCV 100.0 80.0 - 100.0 fL   MCH 31.2 26.0 - 34.0 pg   MCHC 31.2 30.0 - 36.0 g/dL   RDW 13.1 11.5 - 15.5 %   Platelets 164 150 - 400 K/uL   nRBC 0.0 0.0 - 0.2 %    Comment: Performed at  [facility], [place] [address], [place], [place] [address]    Imaging / Studies: DG CHEST PORT 1 VIEW  Result Date: 01/23/2022 CLINICAL DATA:  Short of breath. Cough. Hernia repair surgery yesterday. EXAM: PORTABLE CHEST 1 VIEW COMPARISON:  10/26/2021. FINDINGS: Cardiac silhouette is normal in size and configuration. No mediastinal or hilar masses. Lungs are clear.  No pleural effusion or pneumothorax. Skeletal structures are grossly intact. IMPRESSION: No active disease. Electronically Signed   By: Lajean Manes M.D.   On: 01/23/2022 15:58    Medications / Allergies: per chart  Antibiotics: Anti-infectives (From admission, onward)    Start     Dose/Rate Route Frequency Ordered Stop   01/22/22 0600  ceFAZolin (ANCEF) IVPB 2g/100 mL premix        2 g 200 mL/hr over 30 Minutes Intravenous On call to O.R. 01/22/22 0555 01/22/22 0805         Note: Portions of this report may have been transcribed using voice recognition software. Every effort was made to ensure accuracy; however, inadvertent computerized transcription errors may be present.   Any transcriptional errors that result from this process are unintentional.    Adin Hector, MD, FACS, MASCRS Esophageal, Gastrointestinal & Colorectal Surgery Robotic and Minimally Invasive Surgery  [place]. [address], [place], [place] [address] [phone] Fax [phone] Main  CONTACT INFORMATION:  Weekday (9AM-5PM): Call [facility] main office at [phone]  Weeknight (5PM-9AM) or Weekend/Holiday: Check [email or web address] (password " [email or web address]") for General Surgery [facility] coverage  (Please, do not use SecureChat as it is not reliable communication to reach operating surgeons for immediate patient care given surgeries/outpatient duties/clinic/cross-coverage/off post-call which would lead to a delay in care.  Epic staff messaging available for outptient  concerns, but may not be answered for 48 hours or more).     01/24/2022  8:14 AM

## 2022-01-24 NOTE — Plan of Care (Signed)
  Problem: Activity: Goal: Risk for activity intolerance will decrease Outcome: Progressing   Problem: Nutrition: Goal: Adequate nutrition will be maintained Outcome: Progressing   Problem: Pain Managment: Goal: General experience of comfort will improve Outcome: Progressing   

## 2022-01-25 ENCOUNTER — Inpatient Hospital Stay (HOSPITAL_COMMUNITY): Payer: Medicare HMO

## 2022-01-25 DIAGNOSIS — J9601 Acute respiratory failure with hypoxia: Secondary | ICD-10-CM | POA: Diagnosis not present

## 2022-01-25 DIAGNOSIS — J449 Chronic obstructive pulmonary disease, unspecified: Secondary | ICD-10-CM | POA: Diagnosis not present

## 2022-01-25 LAB — CBC
HCT: 35.7 % — ABNORMAL LOW (ref 36.0–46.0)
Hemoglobin: 11.2 g/dL — ABNORMAL LOW (ref 12.0–15.0)
MCH: 31.3 pg (ref 26.0–34.0)
MCHC: 31.4 g/dL (ref 30.0–36.0)
MCV: 99.7 fL (ref 80.0–100.0)
Platelets: 188 10*3/uL (ref 150–400)
RBC: 3.58 MIL/uL — ABNORMAL LOW (ref 3.87–5.11)
RDW: 13.2 % (ref 11.5–15.5)
WBC: 13.8 10*3/uL — ABNORMAL HIGH (ref 4.0–10.5)
nRBC: 0 % (ref 0.0–0.2)

## 2022-01-25 LAB — BASIC METABOLIC PANEL
Anion gap: 5 (ref 5–15)
BUN: 30 mg/dL — ABNORMAL HIGH (ref 8–23)
CO2: 24 mmol/L (ref 22–32)
Calcium: 9.8 mg/dL (ref 8.9–10.3)
Chloride: 103 mmol/L (ref 98–111)
Creatinine, Ser: 1.16 mg/dL — ABNORMAL HIGH (ref 0.44–1.00)
GFR, Estimated: 49 mL/min — ABNORMAL LOW (ref >60)
Glucose, Bld: 119 mg/dL — ABNORMAL HIGH (ref 70–99)
Potassium: 5.1 mmol/L (ref 3.5–5.1)
Sodium: 132 mmol/L — ABNORMAL LOW (ref 135–145)

## 2022-01-25 MED ORDER — IPRATROPIUM-ALBUTEROL 0.5-2.5 (3) MG/3ML IN SOLN: 3.0000 mL | RESPIRATORY_TRACT | Status: DC | PRN

## 2022-01-25 MED ORDER — IPRATROPIUM-ALBUTEROL 0.5-2.5 (3) MG/3ML IN SOLN
3.0000 mL | Freq: Two times a day (BID) | RESPIRATORY_TRACT | Status: DC
  Administered 2022-01-25 – 2022-01-27 (×5): 3 mL via RESPIRATORY_TRACT
  Filled 2022-01-25 (×5): qty 3

## 2022-01-25 NOTE — Progress Notes (Signed)
Progress Note: General Surgery Service   Chief Complaint/Subjective: Had bowel movement.  Wants real food.  Objective: Vital signs in last 24 hours: Temp:  [98.6 F (37 C)] 98.6 F (37 C) (10/29 1412) Pulse Rate:  [57-64] 62 (10/30 0518) Resp:  [14-17] 16 (10/30 0518) BP: (102-111)/(53-57) 102/57 (10/30 0518) SpO2:  [91 %-98 %] 92 % (10/30 0829) Last BM Date : 01/24/22  Intake/Output from previous day: 10/29 0701 - 10/30 0700 In: 1083 [P.O.:1080; I.V.:3] Out: 800 [Urine:800] Intake/Output this shift: No intake/output data recorded.  GI: Abd incisions c/d/I healing well  Lab Results: CBC  Recent Labs    01/24/22 0456 01/25/22 0408  WBC 12.9* 13.8*  HGB 10.2* 11.2*  HCT 32.7* 35.7*  PLT 164 188   BMET Recent Labs    01/24/22 0456 01/25/22 0408  NA 133* 132*  K 4.9 5.1  CL 102 103  CO2 25 24  GLUCOSE 126* 119*  BUN 29* 30*  CREATININE 1.28* 1.16*  CALCIUM 9.8 9.8   PT/INR No results for input(s): "LABPROT", "INR" in the last 72 hours. ABG No results for input(s): "PHART", "HCO3" in the last 72 hours.  Invalid input(s): "PCO2", "PO2"  Anti-infectives: Anti-infectives (From admission, onward)    Start     Dose/Rate Route Frequency Ordered Stop   01/22/22 0600  ceFAZolin (ANCEF) IVPB 2g/100 mL premix        2 g 200 mL/hr over 30 Minutes Intravenous On call to O.R. 01/22/22 0555 01/22/22 0805       Medications: Scheduled Meds:  acetaminophen  1,000 mg Oral Q6H   amLODipine  10 mg Oral Daily   bisacodyl  10 mg Rectal Daily   cholecalciferol  1,000 Units Oral Daily   citalopram  10 mg Oral Daily   docusate sodium  100 mg Oral BID   enoxaparin (LOVENOX) injection  40 mg Subcutaneous Q24H   gabapentin  200 mg Oral TID   ipratropium-albuterol  3 mL Nebulization BID   lip balm   Topical BID   lisinopril  20 mg Oral BID   metoprolol tartrate  50 mg Oral BID   polyethylene glycol  17 g Oral BID   pravastatin  20 mg Oral Daily   simethicone  80 mg  Oral QID   sodium chloride flush  3 mL Intravenous Q12H   umeclidinium bromide  1 puff Inhalation Daily   Continuous Infusions:  sodium chloride     lactated ringers     methocarbamol (ROBAXIN) IV     PRN Meds:.sodium chloride, alum & mag hydroxide-simeth, bisacodyl, HYDROmorphone (DILAUDID) injection, ipratropium-albuterol **AND** ipratropium-albuterol, lactated ringers, magic mouthwash, menthol-cetylpyridinium, methocarbamol (ROBAXIN) IV, ondansetron (ZOFRAN) IV, oxyCODONE, oxyCODONE, phenol, prochlorperazine, sodium chloride flush  Assessment/Plan: s/p Procedure(s): XI ROBOTIC ASSISTED INCISIONAL HERNIA REPAIR WITH MESH XI ROBOTIC ASSISTED BILATERAL INGUINAL  HERNIA WITH MESH 01/22/2022  Leah Mata is doing well after robotic hernia repair 01/22/22.  Regular diet Increase activity Discharge planning  O2 requirement continues - likely a combination of issues, chronic COPD (without exacerbation), decreased compliance from pain and hernia repair.  May require home oxygen.  Medicine consult appreciated.  Once O2 requirement improves and pain control improves, patient will be ready for discharge.  TOC  consult as patient asks about some equipment for home.   LOS: 3 days    Felicie Morn, MD  [facility] Use [email or web address] to contact on call provider  Daily Billing: [phone] - post op

## 2022-01-25 NOTE — Progress Notes (Addendum)
PROGRESS NOTE    Leah Mata  KWI:[id number] DOB: 12-Mar-1946 DOA: 01/22/2022 PCP: Biagio Borg, MD   Brief Narrative: Leah Mata is a 76 y.o. female with past medical history of COPD, hypertension, prediabetes. Patient presented for hernia repair. Hospitalist service consulted for persistent hypoxia post-extubation from surgery. Patient managed on bronchodilators with improvement of symptoms. Possibility she may need home oxygen prior to discharge.   Assessment and Plan:  Acute respiratory failure with hypoxia Persistent since post-extubation. Patient appears to have a history of exertional dyspnea which may indicate a more chronic issue. Chest x-ray without active disease. -Duoneb BID and q4 hours prn; patient can continue Xopenex on discharge -Wean to room air as able -Daily ambulatory pulse ox checks -Incentive spirometer -Repeat chest x-ray   COPD Patient is managed on Xopenex as an outpatient for which she has not used in years secondary to running out of medication. Patient does not follow-up with pulmonology. No evidence of exacerbation at this point. -Incruse Ellipta  Paroxysmal atrial fibrillation -Eliquis per general surgery   Ventral hernia S/p repair. Management by primary.   Hypertension -Agree with continuing home amlodipine, lisinopril and metoprolol tartrate   Depression -Agree with continuing home Ceelxa   Hyperlipidemia -Agree with continuing pravastatin   CKD stage II Noted. Baseline creatinine appears to be around 1.1. Increased from baseline to a peak of 1.3 without meeting criteria for AKI.  Hyponatremia Mild and asymptomatic. Possibly related to pain.   Pre-diabetes Not on medication management. Recommend continued follow-up with PCP.   DVT prophylaxis: Per primary Code Status:   Code Status: Full Code Family Communication: None at bedside Disposition Plan: From medicine standpoint, can discharge today with oxygen if qualifies or  without oxygen if she does not qualify. Final decision per primary.   Procedures:  Incisional hernia repair with mesh  Antimicrobials: Cefazolin    Subjective: Patient feels better than yesterday. Breathing treatments are helping.  Objective: BP (!) 102/57 (BP Location: Right Arm)   Pulse 62   Temp 98.6 F (37 C) (Oral)   Resp 16   Wt 84.4 kg   LMP 03/29/1993 (Approximate)   SpO2 92%   BMI 30.02 kg/m   Examination:  General exam: Appears calm and comfortable Respiratory system: Diminished with faint end-expiratory wheezing bilaterally. Respiratory effort normal. Cardiovascular system: S1 & S2 heard. No murmurs, rubs, gallops or clicks. Gastrointestinal system: Abdomen is nondistended, soft and nontender. Normal bowel sounds heard. Central nervous system: Alert and oriented. No focal neurological deficits. Musculoskeletal: No edema. No calf tenderness Skin: No cyanosis. No rashes Psychiatry: Judgement and insight appear normal. Mood & affect appropriate.    Data Reviewed: I have personally reviewed following labs and imaging studies  CBC Lab Results  Component Value Date   WBC 13.8 (H) 01/25/2022   RBC 3.58 (L) 01/25/2022   HGB 11.2 (L) 01/25/2022   HCT 35.7 (L) 01/25/2022   MCV 99.7 01/25/2022   MCH 31.3 01/25/2022   PLT 188 01/25/2022   MCHC 31.4 01/25/2022   RDW 13.2 01/25/2022   LYMPHSABS 2.5 04/20/2021   MONOABS 1.0 04/20/2021   EOSABS 0.2 04/20/2021   BASOSABS 0.1 24/26/8341     Last metabolic panel Lab Results  Component Value Date   NA 132 (L) 01/25/2022   K 5.1 01/25/2022   CL 103 01/25/2022   CO2 24 01/25/2022   BUN 30 (H) 01/25/2022   CREATININE 1.16 (H) 01/25/2022   GLUCOSE 119 (H) 01/25/2022   GFRNONAA  49 (L) 01/25/2022   GFRAA 65 08/02/2018   CALCIUM 9.8 01/25/2022   PHOS 2.7 05/30/2018   PROT 7.3 04/20/2021   ALBUMIN 4.1 04/20/2021   BILITOT 0.4 04/20/2021   ALKPHOS 71 04/20/2021   AST 19 04/20/2021   ALT 13 04/20/2021    ANIONGAP 5 01/25/2022    GFR: Estimated Creatinine Clearance: 46.5 mL/min (A) (by C-G formula based on SCr of 1.16 mg/dL (H)).  No results found for this or any previous visit (from the past 240 hour(s)).    Radiology Studies: DG CHEST PORT 1 VIEW  Result Date: 01/25/2022 CLINICAL DATA:  Follow-up hypoxia EXAM: PORTABLE CHEST 1 VIEW COMPARISON:  CT chest 10/26/2021 FINDINGS: Mild bilateral interstitial thickening. No pleural effusion or pneumothorax. No focal consolidation. Stable cardiomediastinal silhouette. No acute osseous abnormality. IMPRESSION: No active disease. Electronically Signed   By: Kathreen Devoid M.D.   On: 01/25/2022 09:28   DG CHEST PORT 1 VIEW  Result Date: 01/23/2022 CLINICAL DATA:  Short of breath. Cough. Hernia repair surgery yesterday. EXAM: PORTABLE CHEST 1 VIEW COMPARISON:  10/26/2021. FINDINGS: Cardiac silhouette is normal in size and configuration. No mediastinal or hilar masses. Lungs are clear.  No pleural effusion or pneumothorax. Skeletal structures are grossly intact. IMPRESSION: No active disease. Electronically Signed   By: Lajean Manes M.D.   On: 01/23/2022 15:58      LOS: 3 days    Leah Poche, MD [facility] 01/25/2022, 9:57 AM   If 7PM-7AM, please contact night-coverage [email or web address]

## 2022-01-25 NOTE — TOC Initial Note (Addendum)
Transition of Care [facility]) - Initial/Assessment Note    Patient Details  Name: Leah Mata MRN: [id number] Date of Birth: July 22, 1945  Transition of Care [facility]) CM/SW Contact:    Vassie Moselle, LCSW Phone Number: 01/25/2022, 2:06 PM  Clinical Narrative:                 Met with pt and confirmed plan for home O2. Pt also interested in bed rails. CSW shared that insurance typically does not cover this and would have to be private pay. CSW reached out to supply company who share that they do not carry bed rails in their retail locations and typically only supply with hospital beds. Met with pt to inform her of this and provided list of supply companies. Pt had friend in the room who shared that she had bed rails she was not using and planned to give these to pt to use.   O2 has been ordered through [employer] and will be delivered to pt's room prior to discharge.   Expected Discharge Plan: Home/Self Care Barriers to Discharge: No Barriers Identified   Patient Goals and CMS Choice Patient states their goals for this hospitalization and ongoing recovery are:: To return home CMS Medicare.gov Compare Post Acute Care list provided to:: Patient Choice offered to / list presented to : Patient  Expected Discharge Plan and Services Expected Discharge Plan: Home/Self Care In-house Referral: NA Discharge Planning Services: CM Consult Post Acute Care Choice: Durable Medical Equipment Living arrangements for the past 2 months: Single Family Home                 DME Arranged: Oxygen DME Agency: [employer] Date DME Agency Contacted: 01/25/22 Time DME Agency Contacted: [phone] Representative spoke with at DME Agency: Andee Poles            Prior Living Arrangements/Services Living arrangements for the past 2 months: [place] Lives with:: Self Patient language and need for interpreter reviewed:: Yes Do you feel safe going back to the place where you live?: Yes      Need for Family  Participation in Patient Care: No (Comment) Care giver support system in place?: No (comment)   Criminal Activity/Legal Involvement Pertinent to Current Situation/Hospitalization: No - Comment as needed  Activities of Daily Living      Permission Sought/Granted Permission sought to share information with : Case Manager Permission granted to share information with : No              Emotional Assessment Appearance:: Appears stated age Attitude/Demeanor/Rapport: Engaged Affect (typically observed): Accepting Orientation: : Oriented to Self, Oriented to Place, Oriented to  Time, Oriented to Situation Alcohol / Substance Use: Not Applicable Psych Involvement: No (comment)  Admission diagnosis:  Ventral hernia [K43.9] Patient Active Problem List   Diagnosis Date Noted   CKD (chronic kidney disease) stage 2, GFR 60-89 ml/min 01/24/2022   Ventral hernia 01/22/2022   Elevated troponin 10/26/2021   Chronic anticoagulation 06/22/2021   Bradycardia 04/20/2021   Bilateral hearing loss due to cerumen impaction 10/17/2020   Rash 09/06/2019   Abdominal wall hernia 09/06/2019   History of 2019 novel coronavirus disease (COVID-19) 03/09/2019   Chronic diastolic heart failure ([place]) 03/09/2019   Depression 03/08/2019   Vitamin D deficiency 03/08/2019   Atrial flutter, paroxysmal ([place]) 06/20/2018   History of AAA (abdominal aortic aneurysm) repair 27/25/3664   Diastolic dysfunction 40/34/7425   Constipation 06/06/2018   Anxiety 06/06/2018   Pressure injury of skin 05/23/2018  Hypoxemia    Pulmonary infiltrates    Paroxysmal atrial fibrillation (HCC)    AAA (abdominal aortic aneurysm) ([place]) 04/29/2018   Left arm pain 11/17/2017   Skin lesion of face 09/07/2016   Allergic rhinitis 09/07/2016   Microhematuria 03/03/2016   Hypercalcemia 08/12/2015   Cough 12/19/2014   Hyperlipidemia 08/22/2013   COPD exacerbation ([place]) 02/24/2011   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 [place] [address]Phone: [phone]: [phone] [place] [address]Phone: [phone]: [phone] [facility]Phone: [phone]: [phone]    Social Determinants of Health (SDOH) Interventions    Readmission Risk Interventions    01/25/2022    2:04 PM  Readmission Risk Prevention Plan  Transportation Screening Complete  PCP or Specialist Appt within 5-7 Days Complete  Home Care Screening Complete  Medication Review (RN CM) Complete

## 2022-01-25 NOTE — Progress Notes (Signed)
Mobility Specialist - Progress Note   01/25/22 1110  Oxygen Therapy  O2 Device Nasal Cannula  O2 Flow Rate (L/min) 3 L/min  Patient Activity (if Appropriate) Ambulating  Pulse Oximetry Type Continuous  Mobility  Activity Ambulated with assistance in hallway  Level of Assistance Independent after set-up  Assistive Device Front wheel walker  Distance Ambulated (ft) 275 ft  Activity Response Tolerated well  Mobility Referral Yes  $Mobility charge 1 Mobility   Pt received in recliner w/ son in room and agreeable to mobility. No complaints during mobility. Pt mentioned this is the farthest she's walked since being admitted. Pt O2 dropped to 87% during mobility, but nonsymptomatic  & was prompted to return to room due to drop. Pt did have some coughing at EOS but stated she felt fine. Pt to recliner after session with all needs met.    Pre-mobility: 55bpm HR, 94% SpO2 During mobility: 63bpm HR, 87% SpO2 Post-mobility: 63bpm HR, 95% SPO2  Set designer

## 2022-01-25 NOTE — Progress Notes (Signed)
SATURATION QUALIFICATIONS: (This note is used to comply with regulatory documentation for home oxygen)  Patient Saturations on Room Air at Rest = 94%  Patient Saturations on Room Air while Ambulating = 84%  Patient Saturations on 3 Liters of oxygen while Ambulating = 95%  Please briefly explain why patient needs home oxygen: Patient desats with ambulation.

## 2022-01-25 NOTE — Care Management Important Message (Signed)
Important Message  Patient Details IM Letter given to the Patient. Name: Leah Mata MRN: [id number] Date of Birth: 1945/04/22   Medicare Important Message Given:  Yes     [name], Runner 01/25/2022, 11:15 AM

## 2022-01-25 NOTE — Hospital Course (Signed)
Leah Mata is a 76 y.o. female with past medical history of COPD, hypertension, prediabetes. Patient presented for hernia repair. Hospitalist service consulted for persistent hypoxia post-extubation from surgery. Patient managed on bronchodilators with improvement of symptoms. Possibility she may need home oxygen prior to discharge.

## 2022-01-25 NOTE — Progress Notes (Signed)
Occupational Therapy Treatment Patient Details Name: Leah Mata MRN: [id number] DOB: 11-01-1945 Today's Date: 01/25/2022   History of present illness 76 y.o. female who is s/p  incisional ventral  hernia repair and bil inguinal hernia repair 01/22/22. PMH:AAA repair in 2020, COPD,  HTN   OT comments  Patient progressing and showed improved independence with LE ADLs with use of long handled adaptive equipment at EOB for donning/doffing socks and underwear compared to previous session.  Pt did note increased difficulty due to new onset hand shakiness, and did need increased time/effort with all tasks to process instructions. Patient remains limited by post-op mild pain, generalized weakness and decreased activity tolerance along with deficits noted below. Pt continues to demonstrate good rehab potential and would benefit from continued skilled OT to increase safety and independence with ADLs and functional transfers to allow pt to return home safely and reduce caregiver burden and fall risk.    Recommendations for follow up therapy are one component of a multi-disciplinary discharge planning process, led by the attending physician.  Recommendations may be updated based on patient status, additional functional criteria and insurance authorization.    Follow Up Recommendations  No OT follow up    Assistance Recommended at Discharge Intermittent Supervision/Assistance  Patient can return home with the following  A little help with bathing/dressing/bathroom;Assistance with cooking/housework;Assist for transportation   Equipment Recommendations  None recommended by OT (Friend to give sock aid and bed rail. Pt has reacher. Family getting long bath brush)    Recommendations for Other Services      Precautions / Restrictions Precautions Precautions: Fall Precaution Comments: monitor O2 Required Braces or Orthoses: Other Brace Other Brace: abdominal binder       Mobility Bed  Mobility Overal bed mobility: Needs Assistance Bed Mobility: Rolling, Sidelying to Sit, Sit to Sidelying Rolling: Modified independent (Device/Increase time) Sidelying to sit: Supervision, HOB elevated     Sit to sidelying: Supervision, HOB elevated      Transfers                         Balance Overall balance assessment: Needs assistance Sitting-balance support: Feet supported Sitting balance-[name]: Good     Standing balance support: During functional activity, No upper extremity supported Standing balance-[name]: Fair                             ADL either performed or assessed with clinical judgement   ADL Overall ADL's : Needs assistance/impaired     Grooming: Standing;Supervision/safety;Wash/dry hands       Lower Body Bathing: Minimal assistance;Sit to/from stand Lower Body Bathing Details (indicate cue type and reason): recommended long handled bath sponge and pt shown example. Pt's friends stated that pt's family is purchasing her one.     Lower Body Dressing: Min guard;Set up;Supervision/safety;With adaptive equipment;Sitting/lateral leans;Sit to/from stand Lower Body Dressing Details (indicate cue type and reason): Pt used long handled adaptive equipment to minimze forward trunk bend. Pt used reacher to doff sock with increased time/effort. Donned sock with OT demonstrating how to place sock on aid, then pt using sock aid to pull over foot. Pt used reacher to don and doff underwear with need of multimodal cues for sequencing/technique and cues for proper holding of reacher as pt confused reacher ends. Pt tried one donning of underwear, but not following cues and unsuccessful. OT demonstrated slowly with 1-step instructions at a  time, then pt able to don over feet with supervision. Pt stood to don over hips with Min guard assist using BUEs to pull. Pt okay with doffing underwear, began in standing then cued to sit to doff over feet for  fall prevention. Pt followed safety cues and used reacher to successfully doff over feet. Toilet Transfer: Supervision/safety;Rolling walker (2 wheels) Toilet Transfer Details (indicate cue type and reason): Pt stood from EOB to RW with supervision.   Toileting - Clothing Manipulation Details (indicate cue type and reason): Pt denied need to void.     Functional mobility during ADLs: Supervision/safety;Rolling walker (2 wheels)      Extremity/Trunk Assessment Upper Extremity Assessment Upper Extremity Assessment: Overall WFL for tasks assessed (Pt reporting acute hand "shakiness" and decreased coordination. Stated that MD was made aware)            Vision Ability to See in Adequate Light: 0 Adequate Patient Visual Report: No change from baseline     Perception     Praxis      Cognition Arousal/Alertness: Awake/alert Behavior During Therapy: WFL for tasks assessed/performed Overall Cognitive Status: Within Functional Limits for tasks assessed                                 General Comments: Difficulty with multi-step instructions. Does not consistently follow cues.  [facility]?)        Exercises      Shoulder Instructions       General Comments      Pertinent Vitals/ Pain       Pain Assessment Pain Assessment: 0-10 Pain Score: 2  Pain Location: abdomen Pain Intervention(s): Limited activity within patient's tolerance, Monitored during session, Premedicated before session  Home Living                                          Prior Functioning/Environment              Frequency  Min 2X/week        Progress Toward Goals  OT Goals(current goals can now be found in the care plan section)  Progress towards OT goals: Progressing toward goals  Acute Rehab OT Goals OT Goal Formulation: With patient Time For Goal Achievement: 02/05/22 Potential to Achieve Goals: Good  Plan Discharge plan remains appropriate     Co-evaluation                 AM-PAC OT "6 Clicks" Daily Activity     Outcome Measure   Help from another person eating meals?: None Help from another person taking care of personal grooming?: None Help from another person toileting, which includes using toliet, bedpan, or urinal?: A Little Help from another person bathing (including washing, rinsing, drying)?: A Little Help from another person to put on and taking off regular upper body clothing?: A Little Help from another person to put on and taking off regular lower body clothing?: A Little 6 Click Score: 20    End of Session Equipment Utilized During Treatment: Rolling walker (2 wheels);Other (comment) (ABD binder secured in supine.)  OT Visit Diagnosis: Unsteadiness on feet (R26.81);Other abnormalities of gait and mobility (R26.89);Muscle weakness (generalized) (M62.81)   Activity Tolerance Patient tolerated treatment well   Patient Left in bed;with call bell/phone within reach;with family/visitor present;with bed alarm set  Nurse Communication Other (comment) (CNA into room for vitals. VSS)        Time: 7371-0626 OT Time Calculation (min): 27 min  Charges: OT General Charges $OT Visit: 1 Visit OT Treatments $Self Care/Home Management : 8-22 mins $Therapeutic Activity: 8-22 mins  Victorino Dike, OT Acute Rehab Services Office: [phone] 01/25/2022   Theodoro Clock 01/25/2022, 1:24 PM

## 2022-01-26 DIAGNOSIS — J9601 Acute respiratory failure with hypoxia: Secondary | ICD-10-CM | POA: Diagnosis not present

## 2022-01-26 DIAGNOSIS — J449 Chronic obstructive pulmonary disease, unspecified: Secondary | ICD-10-CM | POA: Diagnosis not present

## 2022-01-26 LAB — BASIC METABOLIC PANEL
Anion gap: 4 — ABNORMAL LOW (ref 5–15)
BUN: 26 mg/dL — ABNORMAL HIGH (ref 8–23)
CO2: 27 mmol/L (ref 22–32)
Calcium: 9.6 mg/dL (ref 8.9–10.3)
Chloride: 104 mmol/L (ref 98–111)
Creatinine, Ser: 1.04 mg/dL — ABNORMAL HIGH (ref 0.44–1.00)
GFR, Estimated: 56 mL/min — ABNORMAL LOW (ref >60)
Glucose, Bld: 120 mg/dL — ABNORMAL HIGH (ref 70–99)
Potassium: 4.9 mmol/L (ref 3.5–5.1)
Sodium: 135 mmol/L (ref 135–145)

## 2022-01-26 LAB — CBC
HCT: 35 % — ABNORMAL LOW (ref 36.0–46.0)
Hemoglobin: 10.8 g/dL — ABNORMAL LOW (ref 12.0–15.0)
MCH: 30.7 pg (ref 26.0–34.0)
MCHC: 30.9 g/dL (ref 30.0–36.0)
MCV: 99.4 fL (ref 80.0–100.0)
Platelets: 201 10*3/uL (ref 150–400)
RBC: 3.52 MIL/uL — ABNORMAL LOW (ref 3.87–5.11)
RDW: 13.2 % (ref 11.5–15.5)
WBC: 10.1 10*3/uL (ref 4.0–10.5)
nRBC: 0 % (ref 0.0–0.2)

## 2022-01-26 MED ORDER — UMECLIDINIUM BROMIDE 62.5 MCG/ACT IN AEPB: 1.0000 | INHALATION_SPRAY | Freq: Every day | RESPIRATORY_TRACT | 2 refills | Status: DC

## 2022-01-26 MED ORDER — LEVALBUTEROL HCL 1.25 MG/0.5ML IN NEBU: 1.2500 mg | INHALATION_SOLUTION | Freq: Four times a day (QID) | RESPIRATORY_TRACT | 2 refills | Status: DC | PRN

## 2022-01-26 MED ORDER — APIXABAN 5 MG PO TABS
5.0000 mg | ORAL_TABLET | Freq: Two times a day (BID) | ORAL | Status: DC
  Administered 2022-01-26 – 2022-01-27 (×3): 5 mg via ORAL
  Filled 2022-01-26 (×3): qty 1

## 2022-01-26 NOTE — Progress Notes (Signed)
Physical Therapy Treatment Patient Details Name: Leah Mata MRN: [id number] DOB: 11/18/1945 Today's Date: 01/26/2022   History of Present Illness 76 y.o. female who is s/p  incisional ventral  hernia repair and bil inguinal hernia repair 01/22/22. PMH:AAA repair in 2020, COPD,  HTN    PT Comments    Pt ambulated 250' with RW and 2L O2, no loss of balance, verbal cues for positioning in RW. She is progressing well with mobility. PT goals have been met, will sign off. Will plan for pt to continue walking with mobility specialist.     Recommendations for follow up therapy are one component of a multi-disciplinary discharge planning process, led by the attending physician.  Recommendations may be updated based on patient status, additional functional criteria and insurance authorization.  Follow Up Recommendations  No PT follow up     Assistance Recommended at Discharge Intermittent Supervision/Assistance  Patient can return home with the following Assist for transportation;Help with stairs or ramp for entrance   Equipment Recommendations  None recommended by PT    Recommendations for Other Services       Precautions / Restrictions Precautions Precautions: Fall Precaution Comments: monitor O2 Required Braces or Orthoses: Other Brace Other Brace: abdominal binder when out of bed Restrictions Weight Bearing Restrictions: No     Mobility  Bed Mobility               General bed mobility comments: up in recliner    Transfers Overall transfer level: Modified independent Equipment used: Rolling walker (2 wheels) Transfers: Sit to/from Stand Sit to Stand: Modified independent (Device/Increase time)           General transfer comment: good hand placement awareness    Ambulation/Gait Ambulation/Gait assistance: Supervision Gait Distance (Feet): 250 Feet Assistive device: Rolling walker (2 wheels) Gait Pattern/deviations: Step-through pattern, Decreased stride  length Gait velocity: decr     General Gait Details: steady, no loss of balance, VCs for proximity to RW, ambulated with 2L O2   Stairs             Wheelchair Mobility    Modified Rankin (Stroke Patients Only)       Balance Overall balance assessment: Needs assistance Sitting-balance support: Feet supported Sitting balance-[name]: Good     Standing balance support: During functional activity, No upper extremity supported Standing balance-[name]: Fair                              Cognition Arousal/Alertness: Awake/alert Behavior During Therapy: WFL for tasks assessed/performed Overall Cognitive Status: Within Functional Limits for tasks assessed                                          Exercises      General Comments        Pertinent Vitals/Pain Pain Assessment Pain Assessment: No/denies pain Pain Score: 0-No pain    Home Living                          Prior Function            PT Goals (current goals can now be found in the care plan section) Acute Rehab PT Goals PT Goal Formulation: With patient Time For Goal Achievement: 02/05/22 Potential to Achieve Goals: Good      Frequency    Min 3X/week      PT Plan Current plan remains appropriate    Co-evaluation              AM-PAC PT "6 Clicks" Mobility   Outcome Measure  Help needed turning from your back to your side while in a flat bed without using bedrails?: None Help needed moving from lying on your back to sitting on the side of a flat bed without using bedrails?: A Little Help needed moving to and from a bed to a chair (including a wheelchair)?: None Help needed standing up from a chair using your arms (e.g., wheelchair or bedside chair)?: None Help needed to walk in hospital room?: None Help needed climbing 3-5 steps with a railing? : A Little 6 Click Score: 22    End of Session Equipment Utilized During Treatment:  Oxygen Activity Tolerance: Patient tolerated treatment well Patient left: in bed;with call bell/phone within reach;with bed alarm set Nurse Communication: Mobility status PT Visit Diagnosis: Other abnormalities of gait and mobility (R26.89);Difficulty in walking, not elsewhere classified (R26.2)     Time: 1445-1500 PT Time Calculation (min) (ACUTE ONLY): 15 min  Charges:  $Gait Training: 8-22 mins                     Uhlenberg, Jennifer Kistler PT 01/26/2022  Acute Rehabilitation Services  Office [phone]   

## 2022-01-26 NOTE — Progress Notes (Signed)
Mobility Specialist - Progress Note    01/26/22 1309  Oxygen Therapy  O2 Device Nasal Cannula  O2 Flow Rate (L/min) 2 L/min  Mobility  Activity Ambulated with assistance in hallway  Level of Assistance Standby assist, set-up cues, supervision of patient - no hands on  Assistive Device Front wheel walker  Distance Ambulated (ft) 350 ft  Range of Motion/Exercises Active  Activity Response Tolerated well  Mobility Referral Yes   Pt received in recliner and agreeable to mobility. No complaints during mobility.  Pt to recliner after session with all needs met.    Pre-mobility: 62bpm HR, 94% SpO2 During mobility: 70bpm HR, 94% SpO2 Post-mobility: 66bpm HR, 96% SPO2  Set designer

## 2022-01-26 NOTE — Progress Notes (Signed)
PROGRESS NOTE    Leah Mata  ZDG:[id number] DOB: [birth year] DOA: 01/22/2022 PCP: Biagio Borg, MD   Brief Narrative: Leah Mata is a 76 y.o. female with past medical history of COPD, hypertension, prediabetes. Patient presented for hernia repair. Hospitalist service consulted for persistent hypoxia post-extubation from surgery. Patient managed on bronchodilators with improvement of symptoms. Possibility she may need home oxygen prior to discharge.   Assessment and Plan:  Acute respiratory failure with hypoxia Persistent since post-extubation. Patient appears to have a history of exertional dyspnea which may indicate a more chronic issue. Chest x-ray without active disease. -Duoneb BID and q4 hours prn; patient can continue Xopenex on discharge -Wean to room air as able -Daily ambulatory pulse ox checks -Incentive spirometer   COPD Patient is managed on Xopenex as an outpatient for which she has not used in years secondary to running out of medication. Patient does not follow-up with pulmonology. No evidence of exacerbation at this point. -[place] as an outpatient; Duoneb while inpatient  Paroxysmal atrial fibrillation -Eliquis   Ventral hernia S/p repair. Management by primary.   Hypertension -Agree with continuing home amlodipine, lisinopril and metoprolol tartrate   Depression -Agree with continuing home Celexa   Hyperlipidemia -Agree with continuing pravastatin   CKD stage II Noted. Baseline creatinine appears to be around 1.1. Increased from baseline to a peak of 1.3 without meeting criteria for AKI.  Hyponatremia Mild and asymptomatic. Possibly related to pain.   Pre-diabetes Not on medication management. Recommend continued follow-up with PCP.   Discharge recommendations: Discharge on oxygen if required by newly performed ambulatory pulse ox Discharge on Xopenex (refill ordered) and Incruse Ellipta (prescription ordered) Ambulatory  referral to pulmonology (ordered) Continue home medications as previously prescribed   DVT prophylaxis: Per primary Code Status:   Code Status: Full Code Family Communication: Son at bedside Disposition Plan: From medicine standpoint, can discharge today. Final decision per primary.   Procedures:  Incisional hernia repair with mesh  Antimicrobials: Cefazolin    Subjective: Patient is breathing better. Coughing is improved but present and production remains unchanged in consistency but frequency is improved.  Objective: BP (!) 123/57 (BP Location: Left Arm)   Pulse (!) 56   Temp 98.4 F (36.9 C) (Oral)   Resp 18   Wt 84.4 kg   LMP 03/29/1993 (Approximate)   SpO2 97%   BMI 30.02 kg/m   Examination:  General exam: Appears calm and comfortable Respiratory system: Clear to auscultation. Respiratory effort normal. Cardiovascular system: S1 & S2 heard, RRR. No murmurs, rubs, gallops or clicks. Gastrointestinal system: Abdomen is nondistended, soft and nontender. Normal bowel sounds heard. Central nervous system: Alert and oriented. No focal neurological deficits. Musculoskeletal: No edema. No calf tenderness Skin: No cyanosis. No rashes Psychiatry: Judgement and insight appear normal. Mood & affect appropriate.    Data Reviewed: I have personally reviewed following labs and imaging studies  CBC Lab Results  Component Value Date   WBC 10.1 01/26/2022   RBC 3.52 (L) 01/26/2022   HGB 10.8 (L) 01/26/2022   HCT 35.0 (L) 01/26/2022   MCV 99.4 01/26/2022   MCH 30.7 01/26/2022   PLT 201 01/26/2022   MCHC 30.9 01/26/2022   RDW 13.2 01/26/2022   LYMPHSABS 2.5 04/20/2021   MONOABS 1.0 04/20/2021   EOSABS 0.2 04/20/2021   BASOSABS 0.1 59/56/3875     Last metabolic panel Lab Results  Component Value Date   NA 135 01/26/2022   K 4.9  01/26/2022   CL 104 01/26/2022   CO2 27 01/26/2022   BUN 26 (H) 01/26/2022   CREATININE 1.04 (H) 01/26/2022   GLUCOSE 120 (H)  01/26/2022   GFRNONAA 56 (L) 01/26/2022   GFRAA 65 08/02/2018   CALCIUM 9.6 01/26/2022   PHOS 2.7 05/30/2018   PROT 7.3 04/20/2021   ALBUMIN 4.1 04/20/2021   BILITOT 0.4 04/20/2021   ALKPHOS 71 04/20/2021   AST 19 04/20/2021   ALT 13 04/20/2021   ANIONGAP 4 (L) 01/26/2022    GFR: Estimated Creatinine Clearance: 51.9 mL/min (A) (by C-G formula based on SCr of 1.04 mg/dL (H)).  No results found for this or any previous visit (from the past 240 hour(s)).    Radiology Studies: DG CHEST PORT 1 VIEW  Result Date: 01/25/2022 CLINICAL DATA:  Follow-up hypoxia EXAM: PORTABLE CHEST 1 VIEW COMPARISON:  CT chest 10/26/2021 FINDINGS: Mild bilateral interstitial thickening. No pleural effusion or pneumothorax. No focal consolidation. Stable cardiomediastinal silhouette. No acute osseous abnormality. IMPRESSION: No active disease. Electronically Signed   By: Kathreen Devoid M.D.   On: 01/25/2022 09:28      LOS: 4 days    Cordelia Poche, MD [facility] 01/26/2022, 9:13 AM   If 7PM-7AM, please contact night-coverage [email or web address]

## 2022-01-27 LAB — CBC
HCT: 32.8 % — ABNORMAL LOW (ref 36.0–46.0)
Hemoglobin: 10.3 g/dL — ABNORMAL LOW (ref 12.0–15.0)
MCH: 30.7 pg (ref 26.0–34.0)
MCHC: 31.4 g/dL (ref 30.0–36.0)
MCV: 97.9 fL (ref 80.0–100.0)
Platelets: 182 10*3/uL (ref 150–400)
RBC: 3.35 MIL/uL — ABNORMAL LOW (ref 3.87–5.11)
RDW: 13.2 % (ref 11.5–15.5)
WBC: 8 10*3/uL (ref 4.0–10.5)
nRBC: 0 % (ref 0.0–0.2)

## 2022-01-27 LAB — BASIC METABOLIC PANEL
Anion gap: 7 (ref 5–15)
BUN: 20 mg/dL (ref 8–23)
CO2: 24 mmol/L (ref 22–32)
Calcium: 9.3 mg/dL (ref 8.9–10.3)
Chloride: 104 mmol/L (ref 98–111)
Creatinine, Ser: 0.83 mg/dL (ref 0.44–1.00)
GFR, Estimated: >60 mL/min (ref >60)
Glucose, Bld: 103 mg/dL — ABNORMAL HIGH (ref 70–99)
Potassium: 4.4 mmol/L (ref 3.5–5.1)
Sodium: 135 mmol/L (ref 135–145)

## 2022-01-27 NOTE — Discharge Summary (Signed)
Patient ID: Leah Mata [id number] 76 y.o. [birth year]  01/22/2022  Discharge date and time: 01/27/2022  Admitting Physician: [place]  Discharge Physician: [place]  Admission Diagnoses: Ventral hernia [K43.9] Patient Active Problem List   Diagnosis Date Noted   CKD (chronic kidney disease) stage 2, GFR 60-89 ml/min 01/24/2022   Ventral hernia 01/22/2022   Elevated troponin 10/26/2021   Chronic anticoagulation 06/22/2021   Bradycardia 04/20/2021   Bilateral hearing loss due to cerumen impaction 10/17/2020   Rash 09/06/2019   Abdominal wall hernia 09/06/2019   History of 2019 novel coronavirus disease (COVID-19) 03/09/2019   Chronic diastolic heart failure ([place]) 03/09/2019   Depression 03/08/2019   Vitamin D deficiency 03/08/2019   Atrial flutter, paroxysmal ([place]) 06/20/2018   History of AAA (abdominal aortic aneurysm) repair 123456   Diastolic dysfunction 0000000   Constipation 06/06/2018   Anxiety 06/06/2018   Pressure injury of skin 05/23/2018   Hypoxemia    Pulmonary infiltrates    Paroxysmal atrial fibrillation (HCC)    AAA (abdominal aortic aneurysm) ([place]) 04/29/2018   Left arm pain 11/17/2017   Skin lesion of face 09/07/2016   Allergic rhinitis 09/07/2016   Microhematuria 03/03/2016   Hypercalcemia 08/12/2015   Cough 12/19/2014   Hyperlipidemia 08/22/2013   COPD exacerbation ([place]) 02/24/2011   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   Pre-diabetes 08/19/2010   Encounter for well adult exam with abnormal findings 08/19/2010   INSOMNIA-SLEEP DISORDER-UNSPEC 06/05/2009   BACK PAIN 03/14/2009   Essential hypertension 11/01/2006   COPD (chronic obstructive pulmonary disease) ([place]) 11/01/2006     Discharge Diagnoses:  Patient Active Problem List   Diagnosis Date Noted   CKD (chronic kidney disease) stage 2, GFR 60-89 ml/min 01/24/2022   Ventral hernia 01/22/2022   Elevated troponin 10/26/2021   Chronic anticoagulation 06/22/2021    Bradycardia 04/20/2021   Bilateral hearing loss due to cerumen impaction 10/17/2020   Rash 09/06/2019   Abdominal wall hernia 09/06/2019   History of 2019 novel coronavirus disease (COVID-19) 03/09/2019   Chronic diastolic heart failure ([place]) 03/09/2019   Depression 03/08/2019   Vitamin D deficiency 03/08/2019   Atrial flutter, paroxysmal ([place]) 06/20/2018   History of AAA (abdominal aortic aneurysm) repair 123456   Diastolic dysfunction 0000000   Constipation 06/06/2018   Anxiety 06/06/2018   Pressure injury of skin 05/23/2018   Hypoxemia    Pulmonary infiltrates    Paroxysmal atrial fibrillation (HCC)    AAA (abdominal aortic aneurysm) ([place]) 04/29/2018   Left arm pain 11/17/2017   Skin lesion of face 09/07/2016   Allergic rhinitis 09/07/2016   Microhematuria 03/03/2016   Hypercalcemia 08/12/2015   Cough 12/19/2014   Hyperlipidemia 08/22/2013   COPD exacerbation ([place]) 02/24/2011   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   Pre-diabetes 08/19/2010   Encounter for well adult exam with abnormal findings 08/19/2010   INSOMNIA-SLEEP DISORDER-UNSPEC 06/05/2009   BACK PAIN 03/14/2009   Essential hypertension 11/01/2006   COPD (chronic obstructive pulmonary disease) ([place]) 11/01/2006    Operations: Procedure(s): XI ROBOTIC ASSISTED INCISIONAL HERNIA REPAIR WITH MESH XI ROBOTIC ASSISTED BILATERAL INGUINAL  HERNIA WITH MESH  Admission Condition: good  Discharged Condition: good  Indication for Admission: Ventral hernia  Hospital Course:  XI ROBOTIC ASSISTED INCISIONAL HERNIA REPAIR WITH MESH XI [place] WITH MESH 01/22/2022   Leah Mata is doing well after robotic hernia repair 01/22/22.   Regular diet Increase activity Discharge planning   O2 requirement continues - likely a combination of issues, chronic COPD (  without exacerbation), decreased compliance from pain and hernia repair.  May require home oxygen.  Medicine consult appreciated.   Plan for home O2   TOC  consult as patient asks about some equipment for home.  Consults:  Internal medicine  Significant Diagnostic Studies: None  Treatments: surgery: as above  Disposition: Home  Patient Instructions:  Allergies as of 01/27/2022       Reactions   Hydrochlorothiazide Other (See Comments)   unknown        Medication List     TAKE these medications    acetaminophen 500 MG tablet Commonly known as: TYLENOL Take 1,000 mg by mouth every 6 (six) hours as needed for mild pain.   ALPRAZolam 0.25 MG tablet Commonly known as: XANAX TAKE 1 TABLET BY MOUTH TWICE A DAY AS NEEDED FOR ANXIETY   amLODipine 10 MG tablet Commonly known as: NORVASC TAKE 1 TABLET EVERY DAY   apixaban 5 MG Tabs tablet Commonly known as: ELIQUIS Take 1 tablet (5 mg total) by mouth 2 (two) times daily.   cholecalciferol 25 MCG (1000 UNIT) tablet Commonly known as: VITAMIN D3 Take 1,000 Units by mouth daily.   citalopram 10 MG tablet Commonly known as: CELEXA TAKE 1 TABLET EVERY DAY   levalbuterol 1.25 MG/0.5ML nebulizer solution Commonly known as: XOPENEX Take 1.25 mg by nebulization every 6 (six) hours as needed for wheezing or shortness of breath.   lisinopril 20 MG tablet Commonly known as: ZESTRIL TAKE 1 TABLET TWICE DAILY   metoprolol tartrate 50 MG tablet Commonly known as: LOPRESSOR TAKE 1 TABLET TWICE DAILY   oxyCODONE-acetaminophen 5-325 MG tablet Commonly known as: Percocet Take 1 tablet by mouth every 4 (four) hours as needed for severe pain.   pravastatin 20 MG tablet Commonly known as: PRAVACHOL TAKE 1 TABLET EVERY DAY   umeclidinium bromide 62.5 MCG/ACT Aepb Commonly known as: INCRUSE ELLIPTA Inhale 1 puff into the lungs daily.               Durable Medical Equipment  (From admission, onward)           Start     Ordered   01/25/22 1154  For home use only DME oxygen  Once       Question Answer Comment  Length of Need 6 Months   Mode  or (Route) Nasal cannula   Liters per Minute 2   Frequency Continuous (stationary and portable oxygen unit needed)   Oxygen delivery system Gas      01/25/22 1154            Activity: no heavy lifting for 4 weeks Diet: regular diet Wound Care: keep wound clean and dry  Follow-up:  With Dr. Thermon Leyland in 4 weeks.  Signed: Nickola Major Kyce [facility], [place]   01/27/2022, 7:08 AM

## 2022-01-27 NOTE — TOC Transition Note (Addendum)
Transition of Care [facility]) - CM/SW Discharge Note   Patient Details  Name: Leah Mata MRN: [id number] Date of Birth: 11/13/1945  [facility]) CM/SW Contact:  Henrietta Dine, RN Phone Number: 01/27/2022, 9:45 AM   Clinical Narrative:    D/C orders placed; verified pt's sats with Clelia Schaumann, RN; spoke with [facility] [employer] and she says O2 previously delivered to room; met with pt in room and she has transportation home; contact information for [employer] added to follow up provider section of d/c summary; no further TOC needs.   Final next level of care: Home/Self Care Barriers to Discharge: No Barriers Identified   Patient Goals and CMS Choice Patient states their goals for this hospitalization and ongoing recovery are:: To return home CMS Medicare.gov Compare Post Acute Care list provided to:: Patient Choice offered to / list presented to : Patient  Discharge Placement                       Discharge Plan and Services In-house Referral: NA Discharge Planning Services: CM Consult Post Acute Care Choice: Durable Medical Equipment          DME Arranged: Oxygen DME Agency: [employer] Date DME Agency Contacted: 01/25/22 Time DME Agency Contacted: [phone] Representative spoke with at DME Agency: [place] ([place]) Interventions     Readmission Risk Interventions    01/25/2022    2:04 PM  Readmission Risk Prevention Plan  Transportation Screening Complete  PCP or Specialist Appt within 5-7 Days Complete  Home Care Screening Complete  Medication Review (RN CM) Complete

## 2022-01-29 ENCOUNTER — Telehealth: Payer: Self-pay | Admitting: *Deleted

## 2022-01-29 ENCOUNTER — Encounter: Payer: Self-pay | Admitting: *Deleted

## 2022-01-29 NOTE — Patient Outreach (Signed)
Care Coordination TOC Note Transition Care Management Follow-up Telephone Call Date of discharge and from where: Wednesday, 11/01/23[name]; incisional hernia repair with post-op hypoxia How have you been since you were released from the hospital? "I am doing okay, my family is here and they are helping me with everything and anything I need.  I am using the oxygen and the new inhaler and know I am scheduled to see the lung doctor in a couple of weeks-- someone in my family will take me.  I am not using the pain medicine unless I absolutely need it.  The only concern I have is that my daughter-in-law noticed a spot on my bottom that is red with white bumps; she said the skin is not broken, but it is sore... we are keeping it clean, and I will do my best to keep pressure off of it, as you have advised.  I will call Dr. Jonny Ruiz to schedule an appointment with him if it is not better, or if it gets worse.  We will keep a close eye on that area" Any questions or concerns? Yes- as above- red appearing area on bottom  Items Reviewed: Did the pt receive and understand the discharge instructions provided? Yes  Medications obtained and verified? Yes  Other? No  Any new allergies since your discharge? No  Dietary orders reviewed? Yes Do you have support at home? Yes  son, grandson, and daughter-in-law assisting with ADL's and iADL's as needed/ indicated  Home Care and Equipment/Supplies: Were home health services ordered? no If so, what is the name of the agency? N/A  Has the agency set up a time to come to the patient's home? not applicable Were any new equipment or medical supplies ordered?  Yes: new to patient-- oxygen What is the name of the medical supply agency? [facility] Were you able to get the supplies/equipment? yes Do you have any questions related to the use of the equipment or supplies? No  Functional Questionnaire: (I = Independent and D = Dependent) ADLs: I  family assisting  with ADL's and iADL's as needed/ indicated  Bathing/Dressing- I  family assisting with ADL's and iADL's as needed/ indicated  Meal Prep- I  family assisting with ADL's and iADL's as needed/ indicated  Eating- I  Maintaining continence- I  Transferring/Ambulation- I  family assisting with ADL's and iADL's as needed/ indicated  Managing Meds- I  Follow up appointments reviewed:  PCP Hospital f/u appt confirmed? No  Scheduled to see - on - @ [facility] f/u appt confirmed? Yes  Scheduled to see pulmonary provider on Friday 02/12/22 @ 10:30 am; patient confirms she has contact information for surgical provider; reports she was told that surgical provider will contact her to schedule 4 week post-op office visit Are transportation arrangements needed? No  If their condition worsens, is the pt aware to call PCP or go to the Emergency Dept.? Yes Was the patient provided with contact information for the PCP's office or ED? Yes Was to pt encouraged to call back with questions or concerns? Yes provided my direct contact information should needs arise in future  SDOH assessments and interventions completed:   Yes  Care Coordination Interventions Activated:  Yes   Care Coordination Interventions:  Provided education around skin care/ home interventions in setting of possible beginning of post-op decubitus ulcer; encouraged patient/ family to closely monitor and schedule office visit with PCP if area worsens/ does not improve; provided education around benefits of  following diet with protein for healing, avoiding extended pressure on area, need to stay as mobile as possible; discussed/ provided education around safe use of home oxygen     Encounter Outcome:  Pt. Visit Completed    Oneta Rack, RN, BSN, CCRN Alumnus RN CM Care Coordination/ Transition of [place] Management [phone]: direct office

## 2022-02-03 ENCOUNTER — Ambulatory Visit (INDEPENDENT_AMBULATORY_CARE_PROVIDER_SITE_OTHER): Payer: Medicare HMO | Admitting: Internal Medicine

## 2022-02-03 VITALS — BP 120/68 | HR 61 | Temp 98.3°F | Ht 66.0 in | Wt 194.0 lb

## 2022-02-03 DIAGNOSIS — I10 Essential (primary) hypertension: Secondary | ICD-10-CM | POA: Diagnosis not present

## 2022-02-03 DIAGNOSIS — R531 Weakness: Secondary | ICD-10-CM

## 2022-02-03 DIAGNOSIS — R7303 Prediabetes: Secondary | ICD-10-CM | POA: Diagnosis not present

## 2022-02-03 DIAGNOSIS — E559 Vitamin D deficiency, unspecified: Secondary | ICD-10-CM

## 2022-02-03 DIAGNOSIS — B029 Zoster without complications: Secondary | ICD-10-CM

## 2022-02-03 DIAGNOSIS — Z23 Encounter for immunization: Secondary | ICD-10-CM | POA: Diagnosis not present

## 2022-02-03 DIAGNOSIS — J449 Chronic obstructive pulmonary disease, unspecified: Secondary | ICD-10-CM | POA: Diagnosis not present

## 2022-02-03 MED ORDER — ALBUTEROL SULFATE (2.5 MG/3ML) 0.083% IN NEBU: 2.5000 mg | INHALATION_SOLUTION | Freq: Four times a day (QID) | RESPIRATORY_TRACT | 3 refills | Status: DC | PRN

## 2022-02-03 MED ORDER — TRUE METRIX METER DEVI: 0 refills | Status: DC

## 2022-02-03 MED ORDER — VALACYCLOVIR HCL 1 G PO TABS: 1000.0000 mg | ORAL_TABLET | Freq: Three times a day (TID) | ORAL | 0 refills | Status: AC

## 2022-02-03 MED ORDER — TRUE METRIX BLOOD GLUCOSE TEST VI STRP: ORAL_STRIP | 12 refills | Status: DC

## 2022-02-03 MED ORDER — ALPRAZOLAM 0.25 MG PO TABS: ORAL_TABLET | ORAL | 2 refills | Status: DC

## 2022-02-03 NOTE — Progress Notes (Unsigned)
Patient ID: Leah Mata, female   DOB: Oct 27, 1945, 76 y.o.   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 female here with friend for support; Pt denies chest pain, increased sob or doe, wheezing, orthopnea, PND, increased LE swelling, palpitations, dizziness or syncope.  Pt denies fever, wt loss, night sweats, loss of appetite, or other constitutional symptoms, though has had increased generalized weakness with being les active recently.  Pt denies chest pain, increased sob or doe, wheezing, orthopnea, PND, increased LE swelling, palpitations, dizziness or syncope.  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   Denies worsening depressive symptoms, suicidal ideation, or panic; has ongoing anxiety, asks for refill.   Wt Readings from Last 3 Encounters:  02/03/22 194 lb (88 kg)  01/22/22 186 lb (84.4 kg)  01/08/22 186 lb (84.4 kg)   BP Readings from Last 3 Encounters:  02/03/22 120/68  01/27/22 131/62  01/08/22 139/63         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   Procedure: ANEURYSM ABDOMINAL AORTIC REPAIR WITH HEMASHIELD GOLD VASCULAR GRAFT 22X11 mm;  Surgeon: Larina Earthly, MD;  Location: [facility] OR;  Service: Vascular;  Laterality: N/A;   CARDIAC CATHETERIZATION  03/29/2001   negative   DILATION AND CURETTAGE OF UTERUS  1985   TONSILLECTOMY Bilateral    age 89   TUBAL LIGATION  1985   XI ROBOTIC ASSISTED VENTRAL HERNIA N/A 01/22/2022   Procedure: XI ROBOTIC ASSISTED INCISIONAL HERNIA REPAIR WITH MESH;  Surgeon:  Quentin Ore, MD;  Location: [facility] ORS;  Service: General;  Laterality: N/A;    reports that she quit smoking about 3 years ago. Her smoking use included cigarettes. She has a 15.00 pack-year smoking history. She has never used smokeless tobacco. She reports that she does not drink alcohol and does not use drugs. family history includes Congestive Heart Failure in her mother; Coronary artery disease in an other family member; Diabetes in her father and mother; Heart attack in her father; Hypertension in her mother. Allergies  Allergen Reactions   Hydrochlorothiazide Other (See Comments)    unknown   Current Outpatient Medications on File Prior to Visit  Medication Sig Dispense Refill   acetaminophen (TYLENOL) 500 MG tablet Take 1,000 mg by mouth every 6 (six) hours as needed for mild pain.     amLODipine (NORVASC) 10 MG tablet TAKE 1 TABLET EVERY DAY 90 tablet 1   apixaban (ELIQUIS) 5 MG TABS tablet Take 1 tablet (5 mg total) by mouth 2 (two) times daily. 60 tablet 6   cholecalciferol (VITAMIN D3) 25 MCG (1000 UNIT) tablet Take 1,000 Units by mouth daily.     citalopram (CELEXA) 10 MG tablet TAKE 1 TABLET EVERY DAY 90 tablet 3   lisinopril (ZESTRIL) 20 MG tablet TAKE 1 TABLET TWICE DAILY 180 tablet 3   metoprolol tartrate (LOPRESSOR) 50 MG tablet TAKE 1 TABLET TWICE DAILY 180 tablet 3  oxyCODONE-acetaminophen (PERCOCET) 5-325 MG tablet Take 1 tablet by mouth every 4 (four) hours as needed for severe pain. 20 tablet 0   pravastatin (PRAVACHOL) 20 MG tablet TAKE 1 TABLET EVERY DAY 90 tablet 3   umeclidinium bromide (INCRUSE ELLIPTA) 62.5 MCG/ACT AEPB Inhale 1 puff into the lungs daily. 30 each 2   No current facility-administered medications on file prior to visit.        ROS:  All others reviewed and negative.  Objective        PE:  BP 120/68 (BP Location: Left Arm, Patient Position: Sitting, Cuff Size: Large)   Pulse 61   Temp 98.3 F (36.8 C) (Oral)   Ht 5\' 6"  (1.676 m)   Wt  194 lb (88 kg)   LMP 03/29/1993 (Approximate)   SpO2 93%   BMI 31.31 kg/m                 Constitutional: Pt appears in NAD               HENT: Head: NCAT.                Right Ear: External ear normal.                 Left Ear: External ear normal.                Eyes: . Pupils are equal, round, and reactive to light. Conjunctivae and EOM are normal               Nose: without d/c or deformity               Neck: Neck supple. Gross normal ROM               Cardiovascular: Normal rate and regular rhythm.                 Pulmonary/Chest: Effort normal and breath sounds without rales or wheezing.                Abd:  Soft, NT, ND, + BS, no organomegaly               Neurological: Pt is alert. At baseline orientation, motor grossly intact               Skin: , LE edema - none, has area of grouped vesicals on erythem base               Psychiatric: Pt behavior is normal without agitation   Micro: none  Cardiac tracings I have personally interpreted today:  none  Pertinent Radiological findings (summarize): none   Lab Results  Component Value Date   WBC 8.0 01/27/2022   HGB 10.3 (L) 01/27/2022   HCT 32.8 (L) 01/27/2022   PLT 182 01/27/2022   GLUCOSE 103 (H) 01/27/2022   CHOL 151 04/20/2021   TRIG 224.0 (H) 04/20/2021   HDL 47.00 04/20/2021   LDLDIRECT 79.0 04/20/2021   LDLCALC 54 09/06/2019   ALT 13 04/20/2021   AST 19 04/20/2021   NA 135 01/27/2022   K 4.4 01/27/2022   CL 104 01/27/2022   CREATININE 0.83 01/27/2022   BUN 20 01/27/2022   CO2 24 01/27/2022   TSH 0.88 04/20/2021   INR 1.16 04/30/2018   HGBA1C 6.1 (H) 01/08/2022   Assessment/Plan:  Leah Mata is a 76 y.o. White or Caucasian [1] female with  has a past medical history of BACK PAIN (  03/14/2009), COPD (11/01/2006), Dyspnea, HYPERTENSION (11/01/2006), Hypertrophy of tongue papillae (06/05/2009), Other and unspecified hyperlipidemia (08/22/2013), and Pre-diabetes.  COPD (chronic obstructive pulmonary  disease) (HCC) Stable oeall, cont inhaler asd, also for [facility] RN , PT   Vitamin D deficiency Last vitamin D Lab Results  Component Value Date   VD25OH 32.45 04/20/2021   Low, to start oral replacement   Pre-diabetes Lab Results  Component Value Date   HGBA1C 6.1 (H) 01/08/2022   Stable, pt to continue current medical treatment  - diet, wt control, excercise   Essential hypertension BP Readings from Last 3 Encounters:  02/03/22 120/68  01/27/22 131/62  01/08/22 139/63   Stable, pt to continue medical treatment norvasc 10 mg qd, lisinopril 20 mg bid, lopressor 50 mg bid   Shingles outbreak New onset x 2-3 days, mild, for valtrex 1000 tid  x 7 dys  Followup: Return in about 3 months (around 05/06/2022).  Oliver Barre, MD 02/04/2022 8:51 PM [facility]

## 2022-02-03 NOTE — Patient Instructions (Addendum)
You had the flu shot today  Ok to change the levalbuterol to the albuterol nebulize solution  Please take all new medication as prescribed - the valtrex antibiotic sent to [facility]  Your Trumetrix meter and strips were sent to the pharmacy  You will be contacted regarding the referral for: Home Health with RN, PT and aide  Please continue all other medications as before, and refills have been done if requested - xanax  Please have the pharmacy call with any other refills you may need.  Please continue your efforts at being more active, low cholesterol diet, and weight control  Please keep your appointments with your specialists as you may have planned  Please make an Appointment to return in 3 months, or sooner if needed

## 2022-02-04 ENCOUNTER — Encounter: Payer: Self-pay | Admitting: Internal Medicine

## 2022-02-04 DIAGNOSIS — B029 Zoster without complications: Secondary | ICD-10-CM | POA: Insufficient documentation

## 2022-02-04 NOTE — Assessment & Plan Note (Addendum)
Stable oeall, cont inhaler asd, also for [facility] RN , PT

## 2022-02-04 NOTE — Assessment & Plan Note (Signed)
Lab Results  Component Value Date   HGBA1C 6.1 (H) 01/08/2022   Stable, pt to continue current medical treatment  - diet, wt control, excercise

## 2022-02-04 NOTE — Assessment & Plan Note (Signed)
New onset x 2-3 days, mild, for valtrex 1000 tid  x 7 dys

## 2022-02-04 NOTE — Assessment & Plan Note (Signed)
Last vitamin D Lab Results  Component Value Date   VD25OH 32.45 04/20/2021   Low, to start oral replacement

## 2022-02-04 NOTE — Assessment & Plan Note (Signed)
BP Readings from Last 3 Encounters:  02/03/22 120/68  01/27/22 131/62  01/08/22 139/63   Stable, pt to continue medical treatment norvasc 10 mg qd, lisinopril 20 mg bid, lopressor 50 mg bid

## 2022-02-12 ENCOUNTER — Encounter: Payer: Self-pay | Admitting: Internal Medicine

## 2022-02-12 ENCOUNTER — Telehealth: Payer: Self-pay | Admitting: Internal Medicine

## 2022-02-12 ENCOUNTER — Other Ambulatory Visit: Payer: Self-pay | Admitting: Internal Medicine

## 2022-02-12 ENCOUNTER — Ambulatory Visit: Payer: Medicare HMO | Admitting: Internal Medicine

## 2022-02-12 VITALS — BP 136/76 | HR 92 | Temp 97.5°F | Ht 66.0 in | Wt 191.6 lb

## 2022-02-12 DIAGNOSIS — I10 Essential (primary) hypertension: Secondary | ICD-10-CM

## 2022-02-12 DIAGNOSIS — J449 Chronic obstructive pulmonary disease, unspecified: Secondary | ICD-10-CM

## 2022-02-12 DIAGNOSIS — J9611 Chronic respiratory failure with hypoxia: Secondary | ICD-10-CM | POA: Diagnosis not present

## 2022-02-12 MED ORDER — OLMESARTAN MEDOXOMIL 20 MG PO TABS: ORAL_TABLET | ORAL | 11 refills | Status: DC

## 2022-02-12 NOTE — Assessment & Plan Note (Signed)
Quit smoking 2020 and worse breathing since - try off acei 02/12/2022 pending return for pfts    When respiratory symptoms begin or become refractory well after a patient reports complete smoking cessation,  Especially when this wasn't the case while they were smoking, a red flag is raised based on the work of Dr Primitivo Gauze which states:  if you quit smoking when your best day FEV1 is still relatively well preserved it is highly unlikely you will progress to severe disease.  That is to say, once the smoking stops,  the symptoms should not suddenly erupt or markedly worsen.  If so, the differential diagnosis should include  obesity/deconditioning,  LPR/Reflux/Aspiration syndromes,  occult CHF(echo ok 10/2021) , or  especially side effect of medications commonly used in this population. ( Esp ACEi)   >>> Will try off acei and off incruse, just on saba prn per neb until returns for pfts to regroup

## 2022-02-12 NOTE — Telephone Encounter (Signed)
Attempted to call pt but line went directly to VM. Left message for her to return call. °

## 2022-02-12 NOTE — Assessment & Plan Note (Signed)
Try off ACEi  02/12/2022 (unexplained doe assoc with sense of pnds)  In the best review of chronic cough to date ( NEJM 2016 375 [id number]) ,  ACEi are now felt to cause cough in up to  20% of pts which is a 4 fold increase from previous reports and does not include the variety of non-specific complaints we see in pulmonary clinic in pts on ACEi but previously attributed to another dx like  Copd/asthma and  include PNDS, throat and chest congestion, "bronchitis", unexplained dyspnea and noct "strangling" sensations, and hoarseness, but also  atypical /refractory GERD symptoms like dysphagia and "bad heartburn"   The only way I know  to prove this is not an "ACEi Case" is a trial off ACEi x a minimum of 6 weeks then regroup.   Try benicar 20 mg bid / friend is ICU nurse and will call if adjustment needed

## 2022-02-12 NOTE — Telephone Encounter (Signed)
Refilled today, with 11 additional refills attached

## 2022-02-12 NOTE — Assessment & Plan Note (Addendum)
Started on 02 2lpm p hernia surgery oct 2023  - 02/12/2022   Walked on RA  x  2  lap(s) =  approx 500  ft  @ slow pace, stopped due to sob with lowest 02 sats 88% corrected on 3lpm    - referred for best fit ?  POC 02/12/2022 >>>   She has clearly improved since started on 02 but still needs 2lpm hs and with exertion with target sats > 90% on portable systems and encouraged to start more regular walking with goal of building up to > 20 min daily   Each maintenance medication was reviewed in detail including emphasizing most importantly the difference between maintenance and prns and under what circumstances the prns are to be triggered using an action plan format where appropriate.  Total time for H and P, chart review, counseling, reviewing dpi/hfa/neb/02 device(s) , directly observing portions of ambulatory 02 saturation study/ and generating customized AVS unique to this office visit / same day charting  > 45 min for pt new to me

## 2022-02-12 NOTE — Progress Notes (Signed)
Leah Mata, female    DOB: 28-Apr-1945   MRN: [id number]   Brief patient profile:  68  yowf quit smoking 2020 with onset doe around 2016  but much worse since stopped smoking  referred to pulmonary clinic 02/12/2022 by EDP at [facility]  for new onset 02 resp failure        History of Present Illness  02/12/2022  Pulmonary/ 1st office eval/Darcus Edds  rx incruse but not using  Chief Complaint  Patient presents with   Consult    Dyspnea and SOB x 2 months  Dyspnea:  50 ft vs very inactive since 2020 hernia then hernia surgery Oct 27 23 and 02 dep since but not checking sats or titrating Cough: clear mucus more p neb  Sleep: flat bed one pillow  SABA use: just started neb but none on day of ov  02 p last admit 2lpm 24/ 7   No obvious day to day or daytime pattern/variability or assoc  purulent sputum or mucus plugs or hemoptysis or cp or chest tightness, subjective wheeze or overt sinus or hb symptoms.   Sleeping as above without nocturnal  or early am exacerbation  of respiratory  c/o's or need for noct saba. Also denies any obvious fluctuation of symptoms with weather or environmental changes or other aggravating or alleviating factors except as outlined above   No unusual exposure hx or h/o childhood pna/ asthma or knowledge of premature birth.  Current Allergies, Complete Past Medical History, Past Surgical History, Family History, and Social History were reviewed in [employer] record.  ROS  The following are not active complaints unless bolded Hoarseness, sore throat, dysphagia, dental problems, itching, sneezing,  nasal congestion or discharge of excess mucus or purulent secretions, ear ache,   fever, chills, sweats, unintended wt loss or wt gain, classically pleuritic or exertional cp,  orthopnea pnd or arm/hand swelling  or leg swelling, presyncope, palpitations, abdominal pain, anorexia, nausea, vomiting, diarrhea  or change in bowel habits or change in bladder  habits, change in stools or change in urine, dysuria, hematuria,  rash, arthralgias, visual complaints, headache, numbness, weakness or ataxia or problems with walking or coordination,  change in mood or  memory.              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   Other and unspecified hyperlipidemia 08/22/2013   Pre-diabetes     Outpatient Medications Prior to Visit  Medication Sig Dispense Refill   acetaminophen (TYLENOL) 500 MG tablet Take 1,000 mg by mouth every 6 (six) hours as needed for mild pain.     albuterol (PROVENTIL) (2.5 MG/3ML) 0.083% nebulizer solution Take 3 mLs (2.5 mg total) by nebulization every 6 (six) hours as needed for wheezing or shortness of breath. 150 mL 3   ALPRAZolam (XANAX) 0.25 MG tablet TAKE 1 TABLET BY MOUTH TWICE A DAY AS NEEDED FOR ANXIETY 60 tablet 2   amLODipine (NORVASC) 10 MG tablet TAKE 1 TABLET EVERY DAY 90 tablet 1   apixaban (ELIQUIS) 5 MG TABS tablet Take 1 tablet (5 mg total) by mouth 2 (two) times daily. 60 tablet 6   Blood Glucose Monitoring Suppl (TRUE METRIX METER) DEVI Use as directed once daily E11.9 1 each 0   cholecalciferol (VITAMIN D3) 25 MCG (1000 UNIT) tablet Take 1,000 Units by mouth daily.  citalopram (CELEXA) 10 MG tablet TAKE 1 TABLET EVERY DAY 90 tablet 3   glucose blood (TRUE METRIX BLOOD GLUCOSE TEST) test strip Use as instructed once daily E11.9 100 each 12   lisinopril (ZESTRIL) 20 MG tablet TAKE 1 TABLET TWICE DAILY 180 tablet 3   metoprolol tartrate (LOPRESSOR) 50 MG tablet TAKE 1 TABLET TWICE DAILY 180 tablet 3   oxyCODONE-acetaminophen (PERCOCET) 5-325 MG tablet Take 1 tablet by mouth every 4 (four) hours as needed for severe pain. 20 tablet 0   pravastatin (PRAVACHOL) 20 MG tablet TAKE 1 TABLET EVERY DAY 90 tablet 3   umeclidinium bromide (INCRUSE ELLIPTA) 62.5 MCG/ACT AEPB Inhale 1 puff  into the lungs daily. 30 each 2   No facility-administered medications prior to visit.     Objective:     BP 136/76 (BP Location: Left Arm, Patient Position: Sitting, Cuff Size: Large)   Pulse 92   Temp (!) 97.5 F (36.4 C) (Oral)   Ht 5\' 6"  (1.676 m)   Wt 191 lb 9.6 oz (86.9 kg)   LMP 03/29/1993 (Approximate)   SpO2 96%   BMI 30.93 kg/m   SpO2: 96 % O2 Flow Rate (L/min): 2 L/min  Pleasant amb wf nad    HEENT : Oropharynx  clear   Nasal turbinates nl    NECK :  without  apparent JVD/ palpable Nodes/TM    LUNGS: no acc muscle use,  Min barrel  contour chest wall with bilateral  slightly decreased bs s audible wheeze and  without cough on insp or exp maneuvers and min  Hyperresonant  to  percussion bilaterally    CV:  RRR  no s3 or murmur or increase in P2, and no edema   ABD:  soft and nontender with pos end  insp Hoover's  in the supine position.  No bruits or organomegaly appreciated   MS:  Nl gait/ ext warm without deformities Or obvious joint restrictions  calf tenderness, cyanosis or clubbing     SKIN: warm and dry without lesions    NEURO:  alert, approp, nl sensorium with  no motor or cerebellar deficits apparent.            I personally reviewed images and agree with radiology impression as follows:  CXR:   portable 01/25/22 No active disease.        Assessment     COPD GOLD ? Quit smoking 2020 and worse breathing since - try off acei 02/12/2022 pending return for pfts    When respiratory symptoms begin or become refractory well after a patient reports complete smoking cessation,  Especially when this wasn't the case while they were smoking, a red flag is raised based on the work of Dr 02/14/2022 which states:  if you quit smoking when your best day FEV1 is still relatively well preserved it is highly unlikely you will progress to severe disease.  That is to say, once the smoking stops,  the symptoms should not suddenly erupt or markedly  worsen.  If so, the differential diagnosis should include  obesity/deconditioning,  LPR/Reflux/Aspiration syndromes,  occult CHF(echo ok 10/2021) , or  especially side effect of medications commonly used in this population. ( Esp ACEi)   >>> Will try off acei and off incruse, just on saba prn per neb until returns for pfts to regroup  Chronic respiratory failure with hypoxia (HCC) Started on 02 2lpm p hernia surgery oct 2023  - 02/12/2022   Walked on RA  x  2  lap(s) =  approx 500  ft  @ slow pace, stopped due to sob with lowest 02 sats 88% corrected on 3lpm    - referred for best fit ?  POC 02/12/2022 >>>   She has clearly improved since started on 02 but still needs 2lpm hs and with exertion with target sats > 90% on portable systems and encouraged to start more regular walking with goal of building up to > 20 min daily    Essential hypertension Try off ACEi  02/12/2022 (unexplained doe assoc with sense of pnds)  In the best review of chronic cough to date ( NEJM 2016 375 1544-1551) ,  ACEi are now felt to cause cough in up to  20% of pts which is a 4 fold increase from previous reports and does not include the variety of non-specific complaints we see in pulmonary clinic in pts on ACEi but previously attributed to another dx like  Copd/asthma and  include PNDS, throat and chest congestion, "bronchitis", unexplained dyspnea and noct "strangling" sensations, and hoarseness, but also  atypical /refractory GERD symptoms like dysphagia and "bad heartburn"   The only way I know  to prove this is not an "ACEi Case" is a trial off ACEi x a minimum of 6 weeks then regroup.   Try benicar 20 mg bid / friend is ICU nurse and will call if adjustment needed     Each maintenance medication was reviewed in detail including emphasizing most importantly the difference between maintenance and prns and under what circumstances the prns are to be triggered using an action plan format where appropriate.  Total  time for H and P, chart review, counseling, reviewing dpi/hfa/neb/02 device(s) , directly observing portions of ambulatory 02 saturation study/ and generating customized AVS unique to this office visit / same day charting  > 45 min for pt new to me                  Sandrea Hughs, MD 02/12/2022

## 2022-02-12 NOTE — Patient Instructions (Addendum)
Make sure you check your oxygen saturation  AT  your highest level of activity (not after you stop)   to be sure it stays over 90% and adjust  02 flow upward to maintain this level if needed but remember to turn it back to previous settings when you stop (to conserve your supply)  Stop incruse and just use nebulizer albuterol up to every 4 hours if needed   Also  Ok to try albuterol 15 min before an activity (on alternating days)  that you know would usually make you short of breath and see if it makes any difference and if makes none then don't take albuterol after activity unless you can't catch your breath as this means it's the resting that helps, not the albuterol.  Stop lisinopril and start benicar (olmesartan 20 mg twice daily in its place   Referred for best bit for POC and pfts   Please schedule a follow up office visit in 4 weeks, sooner if needed

## 2022-02-12 NOTE — Telephone Encounter (Signed)
Needs pfts ordered and refer to [employer] for best fit for portable 02 ? POC candidate

## 2022-02-16 ENCOUNTER — Telehealth: Payer: Self-pay | Admitting: Internal Medicine

## 2022-02-16 NOTE — Telephone Encounter (Signed)
Order from 02/12/22 states:   Route (nasal cannula OR mask): nasal Liter Flow: 3 Frequency (continuous with stationary and portable oxygen unit needed OR only at night): continuous with stationary and portable oxygen unit needed Length of Need: Lifetime Was this based off an ONO?: no DME: [employer] Oxygen Conserving Device (Yes or No): yes Type of Oxygen: Gas If new O2 start, please evaluate and titrate for best fit POC or portable O2 system. Patient currently has continuous oxygen tank.  Patient qualifies for ambulatory oxygen POC.        Not sure what more information they need

## 2022-02-16 NOTE — Telephone Encounter (Signed)
Message receivied from [employer]   "Hi Raven,   The processing team is advising that the RX is written incorrectly. I'm pasting an example of a correct POC order. Can you have the provider amend asap so we can contact patient and get equipment to her? Thanks!!   Example:   Route (nasal cannula OR mask): nasal cannula  Liter Flow: 3L pulsed via POC  Frequency (continuous with stationary and portable oxygen unit needed OR only at night): POC  Length of Need: Lifetime  Was this based off an ONO?: no  DME: [facility]  Oxygen Conserving Device (Yes or No): yes  Type of Oxygen: Gas  If new O2 start, please evaluate and titrate for best fit POC or portable O2 system. "

## 2022-02-17 NOTE — Telephone Encounter (Signed)
Spoke to [place] from [employer] and she stated the order for O2 has been placed and [facility] will contact the pt today. Nothing further needed at this time.

## 2022-02-23 ENCOUNTER — Telehealth: Payer: Self-pay

## 2022-02-23 ENCOUNTER — Other Ambulatory Visit (HOSPITAL_COMMUNITY): Payer: Self-pay

## 2022-02-23 NOTE — Telephone Encounter (Signed)
Hi!!!  Could you please check this patients insurance and see what is covered as Leah Mata is not covered.  We received a fax today wanting new drug.  Thank you.

## 2022-02-23 NOTE — Telephone Encounter (Signed)
Per benefits investigation insurance insurance will not cover more than 1 tablet per day on the olmesartan. Maybe change to 40mg  instead of the two 20mg  per day? Please advise.

## 2022-02-24 ENCOUNTER — Other Ambulatory Visit: Payer: Self-pay | Admitting: Internal Medicine

## 2022-02-24 DIAGNOSIS — I10 Essential (primary) hypertension: Secondary | ICD-10-CM

## 2022-02-25 NOTE — Telephone Encounter (Signed)
Dr Sherene Sires, You sent in rx for  olmesartan (BENICAR) 20 MG tablet 60 tablet 11 02/12/2022    Sig: One twice daily    Per benefits investigation insurance will not cover more than 1 tablet per day on the olmesartan. Maybe change to 40mg  instead of the two 20mg  per day? Please advise. Thank you.

## 2022-03-02 NOTE — Telephone Encounter (Signed)
Pt calling again about status of BP medication. Primary concern is now will the delay in not taking it before seeing Dr. Sherene Sires affect her appt on 12/13? Seems he wanted to see her after her taking it started. Also would like to know status of prior auth teams research as well.

## 2022-03-08 NOTE — Telephone Encounter (Signed)
Ok to move it out for 4 weeks after change rx

## 2022-03-08 NOTE — Telephone Encounter (Signed)
Called and spoke with patient. She has an appt scheduled for 12/13 for a follow up after her medication change. She has not received the olmesartan because her insurance only allows coverage for 1 tablet once daily.   Per the pharmacy team, the insurance will cover olmesartan 40mg  once daily. I explained to the patient and she stated that she would be ok with this as long as the insurance pays.   She also wanted to know how far should she move her appt out since she does not have the medication.   Dr. , can you please advise? Thanks!

## 2022-03-08 NOTE — Telephone Encounter (Signed)
Patient would like to know if she needs to come to appointment 03/10/2022 with Dr, Sherene Sires. Patient states medication did not get changed. Patient phone number is [phone].

## 2022-03-09 ENCOUNTER — Other Ambulatory Visit (HOSPITAL_COMMUNITY): Payer: Self-pay

## 2022-03-09 NOTE — Telephone Encounter (Signed)
Spoke with the pt  She is aware okay to push appt out 4 wks  I have rescheduled her to [place] office per her request- 04/15/21 at 9:15 am  Advised her of office location

## 2022-03-09 NOTE — Telephone Encounter (Signed)
Please see telephone encounter from 11/28.

## 2022-03-10 ENCOUNTER — Ambulatory Visit: Payer: Medicare HMO | Admitting: Internal Medicine

## 2022-03-15 NOTE — Telephone Encounter (Unsigned)
Dr Sherene Sires has not informed me about this.

## 2022-03-16 NOTE — Telephone Encounter (Signed)
Dr Sherene Sires, You sent in rx for          olmesartan (BENICAR) 20 MG tablet 60 tablet 11 02/12/2022      Sig: One twice daily        Ordered on 02/12/2022 at [facility].  Per benefits investigation insurance will not cover more than 1 tablet per day on the olmesartan. Maybe change to 40mg  instead of the two 20mg  per day? I called and spoke with patient.  She has not picked up the olmesartan yet. Please advise. Thank you.

## 2022-03-17 NOTE — Telephone Encounter (Signed)
Per Dr, Sherene Sires, ok to change olmesartan to 40 mg once a day instead of 20 mg twice a day.  Rx sent to pharmacy.  See telephone contact from 02/23/2022.  Patient informed.

## 2022-03-17 NOTE — Telephone Encounter (Addendum)
Ok per Dr. Sherene Sires to change to Olmesartan 40 mg once a day.  See telephone contact from  02/23/2022.   Called patient to inform of change and med sent to pharmacy

## 2022-04-14 ENCOUNTER — Other Ambulatory Visit: Payer: Self-pay

## 2022-04-14 NOTE — Progress Notes (Signed)
Error

## 2022-04-15 ENCOUNTER — Encounter: Payer: Self-pay | Admitting: Internal Medicine

## 2022-04-15 ENCOUNTER — Ambulatory Visit: Payer: Medicare HMO | Admitting: Internal Medicine

## 2022-04-15 VITALS — BP 132/76 | HR 58 | Temp 98.6°F | Ht 66.0 in | Wt 188.2 lb

## 2022-04-15 DIAGNOSIS — J449 Chronic obstructive pulmonary disease, unspecified: Secondary | ICD-10-CM

## 2022-04-15 DIAGNOSIS — Z87891 Personal history of nicotine dependence: Secondary | ICD-10-CM | POA: Insufficient documentation

## 2022-04-15 DIAGNOSIS — J9611 Chronic respiratory failure with hypoxia: Secondary | ICD-10-CM | POA: Diagnosis not present

## 2022-04-15 DIAGNOSIS — I10 Essential (primary) hypertension: Secondary | ICD-10-CM

## 2022-04-15 MED ORDER — IRBESARTAN 300 MG PO TABS: 300.0000 mg | ORAL_TABLET | Freq: Every day | ORAL | 11 refills | Status: DC

## 2022-04-15 NOTE — Assessment & Plan Note (Signed)
Started on 02 2lpm p hernia surgery oct 2023  - 02/12/2022   Walked on RA  x  2  lap(s) =  approx 500  ft  @ slow pace, stopped due to sob with lowest 02 sats 88% corrected on 3lpm    - referred for best fit  done 02/24/22  needed 3lpm Pulsed to maintain sats > 90% with ex as dropped to 88% on 2 pulsed - 04/15/2022   Walked on RA  x  3  lap(s) =  approx 450  ft  @ slow pace, stopped due to end of study  with lowest 02 sats 91% and no sob  Advised: Make sure you check your oxygen saturation  AT  your highest level of activity (not after you stop)   to be sure it stays over 90% and adjust  02 flow upward to maintain this level if needed but remember to turn it back to previous settings when you stop (to conserve your supply).   Ok to d/c 02 if not needed using above parameters

## 2022-04-15 NOTE — Assessment & Plan Note (Signed)
Try off ACEi   04/15/2022   ACE inhibitors are problematic in  pts with airway complaints because  even experienced pulmonologists can't always distinguish ace effects from copd/asthma.  By themselves they don't actually cause a problem, much like oxygen can't by itself start a fire, but they certainly serve as a powerful catalyst or enhancer for any "fire"  or inflammatory process in the upper airway, be it caused by an ET  tube or more commonly reflux (especially in the obese or pts with known GERD or who are on biphoshonates).    In the era of ARB near equivalency until we have a better handle on the reversibility of the airway problem, it just makes sense to avoid ACEI  entirely in the short run and then decide later, having established a level of airway control using a reasonable limited regimen, whether to add back ace but even then being very careful to observe the pt for worsening airway control and number of meds used/ needed to control symptoms.    >>> try avaopro 300 mg daily and self monitor bp, call if problems   Each maintenance medication was reviewed in detail including emphasizing most importantly the difference between maintenance and prns and under what circumstances the prns are to be triggered using an action plan format where appropriate.  Total time for H and P, chart review, counseling, reviewing neb device(s) , directly observing portions of ambulatory 02 saturation study/ and generating customized AVS unique to this office visit / same day charting = 33 min

## 2022-04-15 NOTE — Assessment & Plan Note (Addendum)
Stopped smoking 2020  Low-dose CT lung cancer screening is recommended for patients who are 68-75 years of age with a 20+ pack-year history of smoking and who are currently smoking or quit <=15 years ago. No coughing up blood  No unintentional weight loss of > 15 pounds in the last 6 months - pt is eligible for scanning yearly until age 84 > referred

## 2022-04-15 NOTE — Assessment & Plan Note (Signed)
Quit smoking 2020 and worse breathing since - try off acei 02/12/2022 pending return for pfts > did neither, try again 04/15/2022 >>>   No worse off incruse or increased in saba off it so leave off maint rx pending pfts and just use the neb prn as the cough is the main concern

## 2022-04-15 NOTE — Progress Notes (Signed)
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       History of Present Illness  02/12/2022  Pulmonary/ 1st office eval/Shyla Gayheart  rx incruse but not using  Chief Complaint  Patient presents with   Consult    Dyspnea and SOB x 2 months  Dyspnea:  50 ft vs very inactive since 2020 hernia then hernia surgery Oct 27 23 and 02 dep since but not checking sats or titrating Cough: clear mucus more p neb  Sleep: flat bed one pillow  SABA use: just started neb but none on day of ov  02 p last admit 2lpm 24/ 7  Rec Make sure you check your oxygen saturation  AT  your highest level of activity (not after you stop)   to be sure it stays over 90%  Stop incruse and just use nebulizer albuterol up to every 4 hours if needed  Also  Ok to try albuterol 15 min before an activity (on alternating days)  that you know would usually make you short of breath . Stop lisinopril and start benicar (olmesartan 20 mg twice daily in its place  Referred for best fit for POC and pfts      04/15/2022  f/u ov/[place] office/Michae Grimley re: GOLD ?  maint on nothing and doing just as well, wants off 02  Chief Complaint  Patient presents with   Follow-up    Patient wants to discuss O2 Never got new bp meds   Dyspnea:  doing [employer] avg pace not really limited by sob  Cough: min clear thick mucus p neb  Sleeping: flat bed one pillow no am flare  SABA use: neb rarely, no more since stopped  incruse  02: 3lpm shopping not checking sats Covid status: vax never  Lung cancer screening: referred today    No obvious day to day or daytime variability or assoc  mucus plugs or hemoptysis or cp or chest tightness, subjective wheeze or overt sinus or hb symptoms.   Sleping  without nocturnal  or early am  exacerbation  of respiratory  c/o's or need for noct saba. Also denies any obvious fluctuation of symptoms with weather or environmental changes or other aggravating or alleviating factors except as outlined above   No unusual exposure hx or h/o childhood pna/ asthma or knowledge of premature birth.  Current Allergies, Complete Past Medical History, Past Surgical History, Family History, and Social History were reviewed in [employer] record.  ROS  The following are not active complaints unless bolded Hoarseness, sore throat, dysphagia, dental problems, itching, sneezing,  nasal congestion or discharge of excess mucus or purulent secretions, ear ache,   fever, chills, sweats, unintended wt loss or wt gain, classically pleuritic or exertional cp,  orthopnea pnd or arm/hand swelling  or leg swelling, presyncope, palpitations, abdominal pain, anorexia, nausea, vomiting, diarrhea  or change in bowel habits or change in bladder habits, change in stools or change in urine, dysuria, hematuria,  rash, arthralgias, visual complaints, headache, numbness, weakness or ataxia or problems with walking or coordination,  change in mood or  memory.        Current Meds  Medication Sig   acetaminophen (TYLENOL) 500 MG  tablet Take 1,000 mg by mouth every 6 (six) hours as needed for mild pain.   albuterol (PROVENTIL) (2.5 MG/3ML) 0.083% nebulizer solution Take 3 mLs (2.5 mg total) by nebulization every 6 (six) hours as needed for wheezing or shortness of breath.   ALPRAZolam (XANAX) 0.25 MG tablet TAKE 1 TABLET BY MOUTH TWICE A DAY AS NEEDED FOR ANXIETY   amLODipine (NORVASC) 10 MG tablet TAKE 1 TABLET EVERY DAY   apixaban (ELIQUIS) 5 MG TABS tablet Take 1 tablet (5 mg total) by mouth 2 (two) times daily.   Blood Glucose Monitoring Suppl (TRUE METRIX METER) DEVI Use as directed once daily E11.9   cholecalciferol (VITAMIN D3) 25 MCG (1000 UNIT) tablet Take 1,000 Units by mouth daily.   citalopram  (CELEXA) 10 MG tablet TAKE 1 TABLET EVERY DAY   glucose blood (TRUE METRIX BLOOD GLUCOSE TEST) test strip Use as instructed once daily E11.9   lisinopril (ZESTRIL) 20 MG tablet Take 20 mg by mouth 2 (two) times daily.   metoprolol tartrate (LOPRESSOR) 50 MG tablet TAKE 1 TABLET TWICE DAILY   oxyCODONE-acetaminophen (PERCOCET) 5-325 MG tablet Take 1 tablet by mouth every 4 (four) hours as needed for severe pain.   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   Other and unspecified hyperlipidemia 08/22/2013   Pre-diabetes         Objective:    Wt Readings from Last 3 Encounters:  04/15/22 188 lb 3.2 oz (85.4 kg)  02/12/22 191 lb 9.6 oz (86.9 kg)  02/03/22 194 lb (88 kg)      Vital signs reviewed  04/15/2022  - Note at rest 02 sats  93% on RA   General appearance:    pleasant amb wf nad   HEENT : Oropharynx  clear  Nasal turbinates nl    NECK :  without  apparent JVD/ palpable Nodes/TM    LUNGS: no acc muscle use,  Min barrel  contour chest wall with bilateral  slightly decreased bs s audible wheeze and  without cough on insp or exp maneuvers and min  Hyperresonant  to  percussion bilaterally    CV:  RRR  no s3 or murmur or increase in P2, and no edema   ABD: obese soft and nontender   MS:  Nl gait/ ext warm without deformities Or obvious joint restrictions  calf tenderness, cyanosis or clubbing     SKIN: warm and dry without lesions    NEURO:  alert, approp, nl sensorium with  no motor or cerebellar deficits apparent.                 Assessment

## 2022-04-15 NOTE — Patient Instructions (Addendum)
My office will be contacting you by phone for referral to lung cancer screening program  - if you don't hear back from my office within one week please call us back or notify us thru MyChart and we'll address it right away.   For cough/congestion > mucinex dm 1200 mg every 12hours    Stop lisinopril and start Avapro (Ibesartan) 300 mg daily in its place and call me if a problem  Ok to use nebulizer up to every 4 hours if you can't catch your breath or stop coughing   Ok to stop 0xygen   Please schedule a follow up visit in 3 months but call sooner if needed  -PFTs in meantime - don't use the nebulizer before test - ok to schedule at [place] office to get it done

## 2022-04-21 ENCOUNTER — Other Ambulatory Visit: Payer: Self-pay | Admitting: Internal Medicine

## 2022-04-30 ENCOUNTER — Telehealth: Payer: Self-pay | Admitting: Internal Medicine

## 2022-04-30 NOTE — Telephone Encounter (Signed)
Sounds like they are working ok but need to space them out differently and take the norvasc in the evening and the lospressor and take lopressor in the am and pm  (can always break the lopressor in half and take one half in am and one half in pm if the 50 mg twice daily is too much   If does need lopressor 50 mg bid will need new rx from this office until she sees PCP

## 2022-04-30 NOTE — Telephone Encounter (Signed)
Called and spoke w/ pt she verbalized that Meagan was going to call to have her oxygen picked up and would like Meagan to give her a call back.   Dr.Wert pt Is also concerned about her BP readings before taking her medications being 150/70's but the highest values being 155/126, 173/86, and 189/86. After taking her meds the reading are 130/70s range. She wanted Dr.Wert to be aware and ask for any opinions.

## 2022-04-30 NOTE — Telephone Encounter (Signed)
ATC pt with Dr.Wert's recommendations but was forwarded to VM. Instructed pt to call us back

## 2022-05-07 ENCOUNTER — Telehealth: Payer: Self-pay | Admitting: *Deleted

## 2022-05-07 ENCOUNTER — Telehealth (HOSPITAL_BASED_OUTPATIENT_CLINIC_OR_DEPARTMENT_OTHER): Payer: Self-pay | Admitting: Cardiology

## 2022-05-07 DIAGNOSIS — J9611 Chronic respiratory failure with hypoxia: Secondary | ICD-10-CM

## 2022-05-07 DIAGNOSIS — J449 Chronic obstructive pulmonary disease, unspecified: Secondary | ICD-10-CM

## 2022-05-07 NOTE — Telephone Encounter (Signed)
Follow Up:      Patient wants to know what is the status of her her patient assistance application with her Eliquis.

## 2022-05-07 NOTE — Telephone Encounter (Signed)
Called and went over patients recs from Dr. Melvyn Novas form last phone note:   Tanda Rockers, MD      04/30/22  3:43 PM Note Sounds like they are working ok but need to space them out differently and take the norvasc in the evening and the lospressor and take lopressor in the am and pm  (can always break the lopressor in half and take one half in am and one half in pm if the 50 mg twice daily is too much    If does need lopressor 50 mg bid will need new rx from this office until she sees PCP       Patient also states that her O2 sats have been great and she is ready for order to D/C O2 to be sent. Placed order. Nothing further needed

## 2022-05-11 NOTE — Telephone Encounter (Signed)
I called and spoke with the pt and notified that her order to d/c o2 was sent to [employer] on 05/07/22  They confirmed that this was received today  Nothing further needed

## 2022-05-14 ENCOUNTER — Other Ambulatory Visit: Payer: Self-pay | Admitting: Internal Medicine

## 2022-05-18 ENCOUNTER — Telehealth: Payer: Self-pay | Admitting: Cardiology

## 2022-05-18 MED ORDER — APIXABAN 5 MG PO TABS: 5.0000 mg | ORAL_TABLET | Freq: Two times a day (BID) | ORAL | 3 refills | Status: DC

## 2022-05-18 NOTE — Telephone Encounter (Signed)
Called [employer], they never received the patient assistance documents. Given an additional fax number of [phone]. Will fax again.

## 2022-05-18 NOTE — Telephone Encounter (Signed)
Pt c/o medication issue:  1. Name of Medication: apixaban (ELIQUIS) 5 MG TABS tablet   2. How are you currently taking this medication (dosage and times per day)?   Take 1 tablet (5 mg total) by mouth 2 (two) times daily.    3. Are you having a reaction (difficulty breathing--STAT)? No  4. What is your medication issue? Pt is calling because she dropped off a patient assistance application at the end of December, and is calling to check the status of it.

## 2022-05-18 NOTE — Telephone Encounter (Signed)
Returned call to patient and provided her update that we are resending information for review. Patient is appreciative of the call.

## 2022-05-19 ENCOUNTER — Encounter (HOSPITAL_BASED_OUTPATIENT_CLINIC_OR_DEPARTMENT_OTHER): Payer: Medicare HMO

## 2022-05-19 ENCOUNTER — Other Ambulatory Visit: Payer: Self-pay | Admitting: Cardiology

## 2022-05-19 DIAGNOSIS — I10 Essential (primary) hypertension: Secondary | ICD-10-CM

## 2022-05-21 ENCOUNTER — Telehealth (HOSPITAL_BASED_OUTPATIENT_CLINIC_OR_DEPARTMENT_OTHER): Payer: Self-pay | Admitting: Cardiology

## 2022-05-21 NOTE — Telephone Encounter (Signed)
Follow Up:     Patient said she was returning a call from this morning, but she did not know who called.

## 2022-05-21 NOTE — Telephone Encounter (Signed)
Left detailed message did not see where anyone had called

## 2022-05-27 ENCOUNTER — Encounter (HOSPITAL_BASED_OUTPATIENT_CLINIC_OR_DEPARTMENT_OTHER): Payer: Self-pay | Admitting: *Deleted

## 2022-05-28 ENCOUNTER — Ambulatory Visit (INDEPENDENT_AMBULATORY_CARE_PROVIDER_SITE_OTHER): Payer: Medicare HMO | Admitting: Internal Medicine

## 2022-05-28 DIAGNOSIS — Z87891 Personal history of nicotine dependence: Secondary | ICD-10-CM

## 2022-05-28 DIAGNOSIS — F17201 Nicotine dependence, unspecified, in remission: Secondary | ICD-10-CM | POA: Diagnosis not present

## 2022-05-28 LAB — PULMONARY FUNCTION TEST
DL/VA % pred: 87 %
DL/VA: 3.58 ml/min/mmHg/L
DLCO cor % pred: 57 %
DLCO cor: 10.93 ml/min/mmHg
DLCO unc % pred: 57 %
DLCO unc: 10.93 ml/min/mmHg
FEF 25-75 Post: 0.58 L/sec
FEF 25-75 Pre: 0.66 L/sec
FEF2575-%Change-Post: -12 %
FEF2575-%Pred-Post: 34 %
FEF2575-%Pred-Pre: 39 %
FEV1-%Change-Post: -1 %
FEV1-%Pred-Post: 52 %
FEV1-%Pred-Pre: 53 %
FEV1-Post: 1.11 L
FEV1-Pre: 1.12 L
FEV1FVC-%Change-Post: -4 %
FEV1FVC-%Pred-Pre: 85 %
FEV6-%Change-Post: 2 %
FEV6-%Pred-Post: 67 %
FEV6-%Pred-Pre: 65 %
FEV6-Post: 1.81 L
FEV6-Pre: 1.76 L
FEV6FVC-%Change-Post: 0 %
FEV6FVC-%Pred-Post: 104 %
FEV6FVC-%Pred-Pre: 105 %
FVC-%Change-Post: 3 %
FVC-%Pred-Post: 64 %
FVC-%Pred-Pre: 62 %
FVC-Post: 1.83 L
FVC-Pre: 1.76 L
Post FEV1/FVC ratio: 61 %
Post FEV6/FVC ratio: 99 %
Pre FEV1/FVC ratio: 64 %
Pre FEV6/FVC Ratio: 100 %
RV % pred: 145 %
RV: 3.33 L
TLC % pred: 101 %
TLC: 5.11 L

## 2022-05-28 NOTE — Progress Notes (Signed)
Full PFT Performed Today. 

## 2022-05-28 NOTE — Patient Instructions (Signed)
Full PFT Performed Today. 

## 2022-05-31 NOTE — Progress Notes (Signed)
Called and spoke with pt and there was no answer- LMTCB.

## 2022-06-04 ENCOUNTER — Telehealth: Payer: Self-pay

## 2022-06-04 NOTE — Telephone Encounter (Signed)
Dr. Melvyn Novas: Just FYI Patient wanted to make sure Dr. Melvyn Novas knows she has not smoked cigarettes in over 4 years.  Result note from PFT 05/28/22 states for patient to work on stop smoking. Thank you.

## 2022-06-04 NOTE — Progress Notes (Signed)
Fax received from [facility] for medical clearance/medication hold for hernia repair to be signed by Dr. Donnetta Hutching.  Provider signed, form faxed back to sender, verified successful, sent to scan center.

## 2022-06-07 NOTE — Telephone Encounter (Signed)
Per Dr. Melvyn Novas: (from result note)  Call patient :  my apology,  my notes have it entered correctly that she quit in 2020 but the PFT form suggested that she was using them and I see where it was corrected so we're all set on our end.   Be sure patient has/keeps f/u ov so we can go over all the details of this study and get a plan together moving forward - ok to move up f/u if not feeling better and wants to be seen sooner  Patient informed.

## 2022-06-11 NOTE — Telephone Encounter (Signed)
Received notification from [facility] patient assistance patient needs to meet 3% out of pocket Rx expenses, based on household adjusted gross income  Mychart message sent 2/29, never read Left message to call back

## 2022-06-18 ENCOUNTER — Telehealth: Payer: Self-pay | Admitting: Cardiology

## 2022-06-18 MED ORDER — APIXABAN 5 MG PO TABS: 5.0000 mg | ORAL_TABLET | Freq: Two times a day (BID) | ORAL | 0 refills | Status: DC

## 2022-06-18 NOTE — Telephone Encounter (Signed)
Prescription refill request for Eliquis received. Indication: afib  Last office visit: 12/10/2020 Scr: 1.04, 01/27/2022 Age: 77 yo  Weight: 95.4 kg   Pt is overdue to see physician. Pt scheduled to see Dr. Harrell Gave 07/28/2022. Will send in refill sos he does not run out.

## 2022-06-18 NOTE — Telephone Encounter (Signed)
Returned patient's call.  She had already received the letter from [facility] with the requirements for patient assistance.  Patient is requesting Eliquis samples. Explained to patient we would provide samples this time, however we are unable to provide maintenance samples.  She verbalized understanding and requested we call a refill to her Pharmacy [facility].  Forwarding message to our Anticoagulation pool for review.  Georgana Curio MHA RN CCM

## 2022-06-18 NOTE — Telephone Encounter (Signed)
Patient said that she just received a phone call from Dr. Judeth Cornfield off but could not get the name of person who called her. Please call back

## 2022-06-21 MED ORDER — APIXABAN 5 MG PO TABS: 5.0000 mg | ORAL_TABLET | Freq: Two times a day (BID) | ORAL | 0 refills | Status: DC

## 2022-06-21 NOTE — Addendum Note (Signed)
Addended by: Gerald Stabs on: 06/21/2022 10:00 AM   Modules accepted: Orders

## 2022-06-21 NOTE — Telephone Encounter (Signed)
Patient here for samples!  2 boxes of Eliquis given to patient. Logged per policy, verified with [employer] .

## 2022-06-23 NOTE — Progress Notes (Signed)
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       History of Present Illness  02/12/2022  Pulmonary/ 1st office eval/Leah Mata  rx incruse but not using  Chief Complaint  Patient presents with   Consult    Dyspnea and SOB x 2 months  Dyspnea:  50 ft vs very inactive since 2020 hernia then hernia surgery Oct 27 23 and 02 dep since but not checking sats or titrating Cough: clear mucus more p neb  Sleep: flat bed one pillow  SABA use: just started neb but none on day of ov  02 p last admit 2lpm 24/ 7  Rec Make sure you check your oxygen saturation  AT  your highest level of activity (not after you stop)   to be sure it stays over 90%  Stop incruse and just use nebulizer albuterol up to every 4 hours if needed  Also  Ok to try albuterol 15 min before an activity (on alternating days)  that you know would usually make you short of breath . Stop lisinopril and start benicar (olmesartan 20 mg twice daily in its place  Referred for best fit for POC and pfts      04/15/2022  f/u ov/Leah Mata office/Leah Mata re: GOLD ?  maint on nothing and doing just as well, wants off 02  Chief Complaint  Patient presents with   Follow-up    Patient wants to discuss O2 Never got new bp meds   Dyspnea:  doing [employer] / [employer] avg pace not really limited by sob  Cough: min clear thick mucus p neb  Sleeping: flat bed one pillow no am flare  SABA use: neb rarely, no more since stopped  incruse  02: 3lpm shopping not checking sats Covid status: vax never  Lung cancer screening: referred today  Rec My office will be contacting you by phone for referral to lung cancer screening program   For cough/congestion > mucinex dm 1200 mg every 12hours  Stop lisinopril and start Avapro  (Ibesartan) 300 mg daily in its place and call me if a problem Ok to use nebulizer up to every 4 hours if you can't catch your breath or stop coughing  Ok to stop 0xygen       06/25/2022  f/u ov/Leah Mata office/Leah Mata re: GOLD 2 maint on neb  3  x weekly typically  p exertion but vague about what level of activity calls for saba  Chief Complaint  Patient presents with   Follow-up    Pt f/u to discuss PFT results, states that her breathing is "ok"  Dyspnea:  walk all  [employer]/ [employer] / up a landing x 6 steps ok   Cough: rattling cough in am's since quit smoking/ min mucoid  Sleeping: flat bed one pillow no cc  SABA use: p exertion only 02: none      No obvious day to day or daytime variability or assoc  purulent sputum or mucus plugs or hemoptysis or cp or chest tightness, subjective wheeze or overt sinus or hb symptoms.   Sleeping  without nocturnal  or early am exacerbation  of respiratory  c/o's or need for noct saba. Also denies any obvious  fluctuation of symptoms with weather or environmental changes or other aggravating or alleviating factors except as outlined above   No unusual exposure hx or h/o childhood pna/ asthma or knowledge of premature birth.  Current Allergies, Complete Past Medical History, Past Surgical History, Family History, and Social History were reviewed in [employer] record.  ROS  The following are not active complaints unless bolded Hoarseness, sore throat, dysphagia, dental problems, itching, sneezing,  nasal congestion or discharge of excess mucus or purulent secretions, ear ache,   fever, chills, sweats, unintended wt loss or wt gain, classically pleuritic or exertional cp,  orthopnea pnd or arm/hand swelling  or leg swelling, presyncope, palpitations, abdominal pain, anorexia, nausea, vomiting, diarrhea  or change in bowel habits or change in bladder habits, change in stools or change in urine, dysuria, hematuria,  rash,  arthralgias, visual complaints, headache, numbness, weakness or ataxia or problems with walking or coordination,  change in mood or  memory.        Current Meds  Medication Sig   acetaminophen (TYLENOL) 500 MG tablet Take 1,000 mg by mouth every 6 (six) hours as needed for mild pain.   albuterol (PROVENTIL) (2.5 MG/3ML) 0.083% nebulizer solution Take 3 mLs (2.5 mg total) by nebulization every 6 (six) hours as needed for wheezing or shortness of breath.   ALPRAZolam (XANAX) 0.25 MG tablet TAKE 1 TABLET BY MOUTH TWICE A DAY AS NEEDED FOR ANXIETY   amLODipine (NORVASC) 10 MG tablet TAKE 1 TABLET EVERY DAY   apixaban (ELIQUIS) 5 MG TABS tablet Take 1 tablet (5 mg total) by mouth 2 (two) times daily.   apixaban (ELIQUIS) 5 MG TABS tablet Take 1 tablet (5 mg total) by mouth 2 (two) times daily.   Blood Glucose Monitoring Suppl (TRUE METRIX AIR GLUCOSE METER) w/Device KIT Use as directed four times per day   Blood Glucose Monitoring Suppl (TRUE METRIX METER) DEVI Use as directed once daily E11.9   cholecalciferol (VITAMIN D3) 25 MCG (1000 UNIT) tablet Take 1,000 Units by mouth daily.   citalopram (CELEXA) 10 MG tablet TAKE 1 TABLET EVERY DAY   glucose blood (TRUE METRIX BLOOD GLUCOSE TEST) test strip Use as instructed once daily E11.9   irbesartan (AVAPRO) 300 MG tablet Take 1 tablet (300 mg total) by mouth daily.   metoprolol tartrate (LOPRESSOR) 50 MG tablet TAKE 1 TABLET TWICE DAILY   oxyCODONE-acetaminophen (PERCOCET) 5-325 MG tablet Take 1 tablet by mouth every 4 (four) hours as needed for severe pain.   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   Other and unspecified hyperlipidemia 08/22/2013   Pre-diabetes          Objective:    Wts  06/25/2022       186   04/15/22 188 lb 3.2 oz (85.4 kg)  02/12/22 191 lb 9.6 oz (86.9 kg)  02/03/22 194 lb (88 kg)     Vital signs reviewed  06/25/2022  - Note at rest 02 sats  92% on RA   General appearance:   elderly wf/ spont rattling cough   HEENT : Oropharynx  clear/ full dentures  NECK :  without  apparent JVD/ palpable Nodes/TM    LUNGS: no acc muscle use,  Mild barrel  contour chest wall with bilateral  Distant bs s audible wheeze and  without cough on insp or exp maneuvers  and mild  Hyperresonant  to  percussion bilaterally     CV:  RRR  no s3 or murmur or increase in P2, and no edema   ABD:  soft and nontender   MS:  Nl gait/ ext warm without deformities Or obvious joint restrictions  calf tenderness, cyanosis or clubbing     SKIN: warm and dry without lesions    NEURO:  alert, approp, nl sensorium with  no motor or cerebellar deficits apparent.     I personally reviewed images and agree with radiology impression as follows:  Chest CTa   10/26/21 Lungs clear           Assessment

## 2022-06-25 ENCOUNTER — Ambulatory Visit: Payer: Medicare HMO | Admitting: Internal Medicine

## 2022-06-25 ENCOUNTER — Encounter: Payer: Self-pay | Admitting: Internal Medicine

## 2022-06-25 VITALS — BP 107/56 | HR 54 | Ht 66.0 in | Wt 186.4 lb

## 2022-06-25 DIAGNOSIS — Z87891 Personal history of nicotine dependence: Secondary | ICD-10-CM

## 2022-06-25 DIAGNOSIS — J449 Chronic obstructive pulmonary disease, unspecified: Secondary | ICD-10-CM

## 2022-06-25 DIAGNOSIS — I10 Essential (primary) hypertension: Secondary | ICD-10-CM | POA: Diagnosis not present

## 2022-06-25 NOTE — Assessment & Plan Note (Signed)
Stopped smoking 2020  My records indicate we referred her for lung cancer screening which she needs in one year from Chest CTa   10/26/21 > defer to PCP to discuss as part of her annual eval or we can coordinate thru this office but since we won't be following her long term prefer PCP refer for scanning if agreeable.  Pulmonary f/u can be prn          Each maintenance medication was reviewed in detail including emphasizing most importantly the difference between maintenance and prns and under what circumstances the prns are to be triggered using an action plan format where appropriate.  Total time for H and P, chart review, counseling, reviewing nebulizer  device(s) and generating customized AVS unique to this office visit / same day charting  > 30 min summary final f/u ov

## 2022-06-25 NOTE — Patient Instructions (Signed)
Ok to try albuterol machine 15 min before an activity (on alternating days)  that you know would usually make you short of breath and see if it makes any difference and if makes none then don't take albuterol after activity unless you can't catch your breath as this means it's the resting that helps, not the albuterol.  If you truly are better from the nebulizer in terms of your wind when you walk I would recommend Bevespi Take 2 puffs first thing in am and then another 2 puffs about 12 hours later. (Should be on the 35 dollar plan by June 2024)    If you are satisfied with your treatment plan,  let your doctor know and he/she can either refill your medications or you can return here when your prescription runs out.     If in any way you are not 100% satisfied,  please tell us.  If 100% better, tell your friends!  Pulmonary follow up is as needed

## 2022-06-25 NOTE — Assessment & Plan Note (Signed)
Quit smoking 2020 and worse breathing since with baseline wt 150  - try off acei 02/12/2022 pending return for pfts > did neither, try again 04/15/2022 >>>  - PFT's  05/28/22  FEV1 1.12 (53 % ) ratio 0.64  p 0 % improvement from saba p 0 prior to study with DLCO  10.93 (57%)   and FV curve mildly concave and ERV 35% at wt 185    Pt is Group B in terms of symptom/risk and laba/lama therefore appropriate rx at this point >>>  lama/laba but she is concerned about benefit vs cost so best option might be bevespi 2 q 12 h under the new [place] 35 dollar plan or stiolto thru the manufacturer.  For now since she can't identify anything where breathing is her limiting problem does not need maintenance  inhaler and can use saba  neb prn   Re SABA :  I spent extra time with pt today reviewing appropriate use of albuterol for prn use on exertion with the following points: 1) saba is for relief of sob that does not improve by walking a slower pace or resting but rather if the pt does not improve after trying this first. 2) If the pt is convinced, as many are, that saba helps recover from activity faster then it's easy to tell if this is the case by re-challenging : ie stop, take the inhaler, then p 5 minutes try the exact same activity (intensity of workload) that just caused the symptoms and see if they are substantially diminished or not after saba 3) if there is an activity that reproducibly causes the symptoms, try the saba 15 min before the activity on alternate days   If in fact the saba really does help, then fine to continue to use it prn but advised may need to look closer at the maintenance regimen being used to achieve better control of airways disease with exertion.

## 2022-06-25 NOTE — Assessment & Plan Note (Signed)
Try off ACEi   04/15/2022 due to cough  / improved   Although even in retrospect it may not be clear the ACEi contributed to the pt's symptoms,  Pt improved off them and adding them back at this point or in the future would risk confusion in interpretation of non-specific respiratory symptoms to which this patient is prone  ie  Better not to muddy the waters here.   >>> continue avarpo 150 mg one half daily and f/u with pcp as planned

## 2022-07-05 ENCOUNTER — Encounter: Payer: Self-pay | Admitting: *Deleted

## 2022-07-12 ENCOUNTER — Ambulatory Visit (INDEPENDENT_AMBULATORY_CARE_PROVIDER_SITE_OTHER): Payer: Medicare HMO

## 2022-07-12 ENCOUNTER — Ambulatory Visit (INDEPENDENT_AMBULATORY_CARE_PROVIDER_SITE_OTHER): Payer: Medicare HMO | Admitting: Internal Medicine

## 2022-07-12 ENCOUNTER — Encounter: Payer: Self-pay | Admitting: Internal Medicine

## 2022-07-12 VITALS — BP 160/92 | HR 77 | Temp 98.3°F | Ht 66.0 in | Wt 185.0 lb

## 2022-07-12 DIAGNOSIS — R062 Wheezing: Secondary | ICD-10-CM | POA: Diagnosis not present

## 2022-07-12 DIAGNOSIS — R509 Fever, unspecified: Secondary | ICD-10-CM | POA: Diagnosis not present

## 2022-07-12 DIAGNOSIS — E538 Deficiency of other specified B group vitamins: Secondary | ICD-10-CM | POA: Diagnosis not present

## 2022-07-12 DIAGNOSIS — R059 Cough, unspecified: Secondary | ICD-10-CM | POA: Diagnosis not present

## 2022-07-12 DIAGNOSIS — E559 Vitamin D deficiency, unspecified: Secondary | ICD-10-CM

## 2022-07-12 DIAGNOSIS — Z1211 Encounter for screening for malignant neoplasm of colon: Secondary | ICD-10-CM

## 2022-07-12 DIAGNOSIS — Z0001 Encounter for general adult medical examination with abnormal findings: Secondary | ICD-10-CM | POA: Diagnosis not present

## 2022-07-12 DIAGNOSIS — J441 Chronic obstructive pulmonary disease with (acute) exacerbation: Secondary | ICD-10-CM | POA: Diagnosis not present

## 2022-07-12 DIAGNOSIS — E7800 Pure hypercholesterolemia, unspecified: Secondary | ICD-10-CM

## 2022-07-12 DIAGNOSIS — R7303 Prediabetes: Secondary | ICD-10-CM

## 2022-07-12 LAB — CBC WITH DIFFERENTIAL/PLATELET
Basophils Absolute: 0.1 10*3/uL (ref 0.0–0.1)
Basophils Relative: 0.5 % (ref 0.0–3.0)
Eosinophils Absolute: 0.1 10*3/uL (ref 0.0–0.7)
Eosinophils Relative: 0.8 % (ref 0.0–5.0)
HCT: 39.4 % (ref 36.0–46.0)
Hemoglobin: 13 g/dL (ref 12.0–15.0)
Lymphocytes Relative: 13.8 % (ref 12.0–46.0)
Lymphs Abs: 2 10*3/uL (ref 0.7–4.0)
MCHC: 33 g/dL (ref 30.0–36.0)
MCV: 90.2 fl (ref 78.0–100.0)
Monocytes Absolute: 1.5 10*3/uL — ABNORMAL HIGH (ref 0.1–1.0)
Monocytes Relative: 10.3 % (ref 3.0–12.0)
Neutro Abs: 10.7 10*3/uL — ABNORMAL HIGH (ref 1.4–7.7)
Neutrophils Relative %: 74.6 % (ref 43.0–77.0)
Platelets: 243 10*3/uL (ref 150.0–400.0)
RBC: 4.37 Mil/uL (ref 3.87–5.11)
RDW: 14.5 % (ref 11.5–15.5)
WBC: 14.3 10*3/uL — ABNORMAL HIGH (ref 4.0–10.5)

## 2022-07-12 LAB — TSH: TSH: 1.38 u[IU]/mL (ref 0.35–5.50)

## 2022-07-12 LAB — POC INFLUENZA A&B (BINAX/QUICKVUE)
Influenza A, POC: NEGATIVE
Influenza B, POC: NEGATIVE

## 2022-07-12 LAB — POC SOFIA SARS ANTIGEN FIA: SARS Coronavirus 2 Ag: NEGATIVE

## 2022-07-12 LAB — POCT RESPIRATORY SYNCYTIAL VIRUS: RSV Rapid Ag: NEGATIVE

## 2022-07-12 LAB — VITAMIN B12: Vitamin B-12: 239 pg/mL (ref 211–911)

## 2022-07-12 LAB — HEMOGLOBIN A1C: Hgb A1c MFr Bld: 6.6 % — ABNORMAL HIGH (ref 4.6–6.5)

## 2022-07-12 MED ORDER — METHYLPREDNISOLONE ACETATE 80 MG/ML IJ SUSP
80.0000 mg | Freq: Once | INTRAMUSCULAR | Status: AC
Start: 2022-07-12 — End: 2022-07-12
  Administered 2022-07-12: 80 mg via INTRAMUSCULAR

## 2022-07-12 MED ORDER — IRBESARTAN 300 MG PO TABS: 300.0000 mg | ORAL_TABLET | Freq: Every day | ORAL | 3 refills | Status: DC

## 2022-07-12 MED ORDER — PREDNISONE 10 MG PO TABS: ORAL_TABLET | ORAL | 0 refills | Status: DC

## 2022-07-12 MED ORDER — LEVOFLOXACIN 500 MG PO TABS: 500.0000 mg | ORAL_TABLET | Freq: Every day | ORAL | 0 refills | Status: AC

## 2022-07-12 MED ORDER — HYDROCODONE BIT-HOMATROP MBR 5-1.5 MG/5ML PO SOLN: 5.0000 mL | Freq: Four times a day (QID) | ORAL | 0 refills | Status: AC | PRN

## 2022-07-12 MED ORDER — TRELEGY ELLIPTA 100-62.5-25 MCG/ACT IN AEPB: 1.0000 | INHALATION_SPRAY | Freq: Every day | RESPIRATORY_TRACT | 11 refills | Status: DC

## 2022-07-12 NOTE — Assessment & Plan Note (Signed)
Mild to mod, for antibx course levaquin 500 mg qd, cough med prn, prednisone, cont albuterol prn and also add trelegy 1 qd,,  to f/u any worsening symptoms or concerns

## 2022-07-12 NOTE — Patient Instructions (Addendum)
Please have your Shingrix (shingles) shots done at your local pharmacy.  You had the steroid shot today  Please take all new medication as prescribed - the antiiotic, cough medicine, prednisone, and Trelegy  Please continue all other medications as before, and the irbesartan 300 mg was sent to [facility]  Please have the pharmacy call with any other refills you may need.  Please continue your efforts at being more active, low cholesterol diet, and weight control.  You are otherwise up to date with prevention measures today.  Please keep your appointments with your specialists as you may have planned  You will be contacted regarding the referral for: colonoscopy  Please go to the XRAY Department in the first floor for the x-ray testing  Please go to the LAB at the blood drawing area for the tests to be done  You will be contacted by phone if any changes need to be made immediately.  Otherwise, you will receive a letter about your results with an explanation, but please check with MyChart first.  Please remember to sign up for MyChart if you have not done so, as this will be important to you in the future with finding out test results, communicating by private email, and scheduling acute appointments online when needed.

## 2022-07-12 NOTE — Assessment & Plan Note (Signed)
Lab Results  Component Value Date   LDLCALC 54 09/06/2019   Stable, pt to continue current statin pravachol 20 mg qd

## 2022-07-12 NOTE — Assessment & Plan Note (Signed)
Lab Results  Component Value Date   HGBA1C 6.1 (H) 01/08/2022   Stable, pt to continue current medical treatment  - wt control, diet

## 2022-07-12 NOTE — Assessment & Plan Note (Signed)
Last vitamin D Lab Results  Component Value Date   VD25OH 32.45 04/20/2021   Low, to start oral replacement  

## 2022-07-12 NOTE — Assessment & Plan Note (Signed)
Age and sex appropriate education and counseling updated with regular exercise and diet Referrals for preventative services - for colonoscopy Immunizations addressed - for shingrix at pharmacy, declines covid booster Smoking counseling  - none needed Evidence for depression or other mood disorder - none significant Most recent labs reviewed. I have personally reviewed and have noted: 1) the patient's medical and social history 2) The patient's current medications and supplements 3) The patient's height, weight, and BMI have been recorded in the chart

## 2022-07-12 NOTE — Progress Notes (Signed)
Patient ID: Leah Mata, female   DOB: 06-25-1945, 77 y.o.   MRN: [id number]         Chief Complaint:: wellness exam and congestion, Sore Throat, Cough (Dark colored mucous), and Wheezing  , hld, hyperglycemia, low vit d       HPI:  Leah Mata is a 77 y.o. female here for wellness exam; for shingrix at pharmacy, declines covid booster,due for colonoscopy, o/w up to date                        Also BP has been stable controlled at home. Here with acute onset mild to mod 2-3 days ST, HA, general weakness and malaise, with prod cough greenish sputum, wheezing and sob doe, but Pt denies chest pain, orthopnea, PND, increased LE swelling, palpitations, dizziness or syncope.   Pt denies polydipsia, polyuria, or new focal neuro s/s.    Pt denies fever, wt loss, night sweats, loss of appetite, or other constitutional symptoms     Wt Readings from Last 3 Encounters:  07/12/22 185 lb (83.9 kg)  06/25/22 186 lb 6.4 oz (84.6 kg)  04/15/22 188 lb 3.2 oz (85.4 kg)   BP Readings from Last 3 Encounters:  07/12/22 (!) 160/92  06/25/22 (!) 107/56  04/15/22 132/76   Immunization History  Administered Date(s) Administered   Fluad Quad(high Dose 65+) 01/20/2019, 04/20/2021, 02/03/2022   Influenza, High Dose Seasonal PF 03/12/2017, 04/06/2018, 03/07/2019   Influenza,inj,Quad PF,6+ Mos 12/01/2012, 01/31/2014, 02/24/2016   Influenza-Unspecified 11/28/2011, 01/11/2015   Pneumococcal Conjugate-13 08/15/2014   Pneumococcal Polysaccharide-23 05/30/2012   Td 11/28/2006   Tdap 09/07/2016   Zoster, Live 11/29/2008   Health Maintenance Due  Topic Date Due   COVID-19 Vaccine (1) Never done   Zoster Vaccines- Shingrix (1 of 2) Never done   Medicare Annual Wellness (AWV)  09/05/2020   COLONOSCOPY (Pts 45-39yrs Insurance coverage will need to be confirmed)  07/08/2022      Past Medical History:  Diagnosis Date   BACK PAIN 03/14/2009   COPD 11/01/2006   Dyspnea    HYPERTENSION 11/01/2006   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   Other and unspecified hyperlipidemia 08/22/2013   Pre-diabetes    Past Surgical History:  Procedure Laterality Date   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 Her smoking use included cigarettes. She has a 15.00 pack-year smoking history. She has never used smokeless tobacco. She reports that she does not drink alcohol and does not use drugs. family history includes Congestive Heart Failure in her mother; Coronary artery disease in an other family member; Diabetes in her father and mother; Heart attack in her father; Hypertension in her mother. Allergies  Allergen Reactions   Hydrochlorothiazide Other (See Comments)    unknown   Current Outpatient Medications on File Prior to Visit  Medication Sig Dispense Refill   acetaminophen (TYLENOL) 500 MG tablet Take 1,000 mg by mouth every 6 (six) hours as needed for mild pain.     albuterol (PROVENTIL) (2.5 MG/3ML) 0.083% nebulizer solution Take 3 mLs (2.5 mg total) by nebulization every 6 (six) hours as needed for wheezing or shortness of breath. 150 mL 3   ALPRAZolam (XANAX) 0.25 MG tablet TAKE 1 TABLET BY MOUTH TWICE A DAY AS NEEDED FOR ANXIETY 60 tablet 2   amLODipine (NORVASC) 10 MG tablet TAKE 1 TABLET EVERY DAY 90 tablet 1   apixaban (ELIQUIS) 5 MG TABS tablet Take 1 tablet (5 mg total) by mouth 2 (two) times daily. 180  tablet 0   apixaban (ELIQUIS) 5 MG TABS tablet Take 1 tablet (5 mg total) by mouth 2 (two) times daily. 28 tablet 0   Blood Glucose Monitoring Suppl (TRUE METRIX AIR GLUCOSE METER) w/Device KIT Use as directed four times per day 1 kit 3   Blood Glucose Monitoring Suppl (TRUE METRIX METER) DEVI Use as directed once daily E11.9 1 each 0   cholecalciferol (VITAMIN D3) 25 MCG (1000 UNIT) tablet Take 1,000 Units by mouth daily.     citalopram (CELEXA) 10 MG tablet TAKE 1 TABLET EVERY DAY 90 tablet 2   glucose blood (TRUE METRIX BLOOD GLUCOSE TEST) test strip Use as instructed once daily E11.9 100 each 12   metoprolol tartrate (LOPRESSOR) 50 MG tablet TAKE 1 TABLET TWICE DAILY 180 tablet 3   oxyCODONE-acetaminophen (PERCOCET) 5-325 MG tablet Take 1 tablet by mouth every 4 (four) hours as needed for severe pain. 20 tablet 0   pravastatin (PRAVACHOL) 20 MG tablet TAKE 1 TABLET EVERY DAY 90 tablet 2   TRUEplus Lancets 33G MISC TEST BLOOD SUGAR ONE TIME DAILY AS DIRECTED 100 each 3   No current facility-administered medications on file prior to visit.        ROS:  All others reviewed and negative.  Objective        PE:  BP (!) 160/92   Pulse 77   Temp 98.3 F (36.8 C) (Oral)   Ht  (1.676 m)   Wt 185 lb (83.9 kg)   LMP 03/29/1993 (Approximate)   SpO2 93%   BMI 29.86 kg/m                 Constitutional: Pt appears in NAD, mild ill               HENT: Head: NCAT.                Right Ear: External ear normal.                 Left Ear: External ear normal. Bilat tm's with mild erythema.  Max sinus areas non tender.  Pharynx with mild erythema, no exudate                Eyes: . Pupils are equal, round, and reactive to light. Conjunctivae and EOM are normal               Nose: without d/c or deformity               Neck: Neck supple. Gross normal ROM               Cardiovascular: Normal rate and regular rhythm.                 Pulmonary/Chest: Effort normal and breath sounds without  rales  but with bilat wheezing.                Abd:  Soft, NT, ND, + BS, no organomegaly               Neurological: Pt is alert. At baseline orientation, motor grossly intact               Skin: Skin is warm. No rashes, no other new lesions, LE edema - none               Psychiatric: Pt behavior is normal without agitation   Micro: none  Cardiac tracings I have personally interpreted today:  none  Pertinent Radiological findings (summarize): none   Lab Results  Component Value Date   WBC 8.0 01/27/2022   HGB 10.3 (L) 01/27/2022   HCT 32.8 (L) 01/27/2022   PLT 182 01/27/2022   GLUCOSE 103 (H) 01/27/2022   CHOL 151 04/20/2021   TRIG 224.0 (H) 04/20/2021   HDL 47.00 04/20/2021   LDLDIRECT 79.0 04/20/2021   LDLCALC 54 09/06/2019   ALT 13 04/20/2021   AST 19 04/20/2021   NA 135 01/27/2022   K 4.4 01/27/2022   CL 104 01/27/2022   CREATININE 0.83 01/27/2022   BUN 20 01/27/2022   CO2 24 01/27/2022   TSH 0.88 04/20/2021   INR 1.16 04/30/2018   HGBA1C 6.1 (H) 01/08/2022   POCT - COVID - neg, Flu - neg, RSV - neg  Assessment/Plan:  Leah Mata is a 77 y.o. White or Caucasian [1] female with  has a past medical history of BACK PAIN (03/14/2009), COPD (11/01/2006), Dyspnea, HYPERTENSION (11/01/2006), Hypertrophy of tongue papillae (06/05/2009), Other and unspecified hyperlipidemia (08/22/2013), and Pre-diabetes.  Encounter for well adult exam with abnormal findings Age and sex appropriate education and counseling updated with regular exercise and diet Referrals for preventative services - for colonoscopy Immunizations addressed - for shingrix at pharmacy, declines covid booster Smoking counseling  - none needed Evidence for depression or other mood disorder - none significant Most recent labs reviewed. I have personally reviewed and have noted: 1) the patient's medical and social history 2) The patient's current medications and supplements 3) The patient's height, weight, and BMI  have been recorded in the chart   Hyperlipidemia Lab Results  Component Value Date   LDLCALC 54 09/06/2019   Stable, pt to continue current statin pravachol 20 mg qd   Pre-diabetes Lab Results  Component Value Date   HGBA1C 6.1 (H) 01/08/2022   Stable, pt to continue current medical treatment  - wt control, diet   Vitamin D deficiency Last vitamin D Lab Results  Component Value Date   VD25OH 32.45 04/20/2021   Low, to start oral replacement   COPD exacerbation (HCC) Mild to mod, for antibx course levaquin 500 mg qd, cough med prn, prednisone, cont albuterol prn and also add trelegy 1 qd,,  to f/u any worsening symptoms or concerns  Followup: Return in about 6 months (around 01/11/2023).  Oliver Barre, MD 07/12/2022 7:46 PM [facility]

## 2022-07-13 LAB — HEPATIC FUNCTION PANEL
ALT: 8 U/L (ref 0–35)
AST: 9 U/L (ref 0–37)
Albumin: 4.2 g/dL (ref 3.5–5.2)
Alkaline Phosphatase: 88 U/L (ref 39–117)
Bilirubin, Direct: 0.1 mg/dL (ref 0.0–0.3)
Total Bilirubin: 0.7 mg/dL (ref 0.2–1.2)
Total Protein: 8.1 g/dL (ref 6.0–8.3)

## 2022-07-13 LAB — URINALYSIS, ROUTINE W REFLEX MICROSCOPIC
Bilirubin Urine: NEGATIVE
Ketones, ur: NEGATIVE
Leukocytes,Ua: NEGATIVE
Nitrite: NEGATIVE
Specific Gravity, Urine: >=1.03 — AB (ref 1.000–1.030)
Urine Glucose: NEGATIVE
Urobilinogen, UA: 1 (ref 0.0–1.0)
pH: 6 (ref 5.0–8.0)

## 2022-07-13 LAB — BASIC METABOLIC PANEL
BUN: 17 mg/dL (ref 6–23)
CO2: 26 mEq/L (ref 19–32)
Calcium: 10.4 mg/dL (ref 8.4–10.5)
Chloride: 97 mEq/L (ref 96–112)
Creatinine, Ser: 1 mg/dL (ref 0.40–1.20)
GFR: 55.2 mL/min — ABNORMAL LOW (ref >60.00)
Glucose, Bld: 113 mg/dL — ABNORMAL HIGH (ref 70–99)
Potassium: 4.1 mEq/L (ref 3.5–5.1)
Sodium: 135 mEq/L (ref 135–145)

## 2022-07-13 LAB — LIPID PANEL
Cholesterol: 143 mg/dL (ref 0–200)
HDL: 50.2 mg/dL (ref >39.00)
LDL Cholesterol: 66 mg/dL (ref 0–99)
NonHDL: 92.4
Total CHOL/HDL Ratio: 3
Triglycerides: 134 mg/dL (ref 0.0–149.0)
VLDL: 26.8 mg/dL (ref 0.0–40.0)

## 2022-07-13 LAB — VITAMIN D 25 HYDROXY (VIT D DEFICIENCY, FRACTURES): VITD: 38.64 ng/mL (ref 30.00–100.00)

## 2022-07-13 NOTE — Progress Notes (Signed)
The test results show that your current treatment is OK, as the tests are stable.  Please continue the same plan.  There is no other need for change of treatment or further evaluation based on these results, at this time.  thanks 

## 2022-07-16 ENCOUNTER — Other Ambulatory Visit: Payer: Self-pay

## 2022-07-16 ENCOUNTER — Other Ambulatory Visit: Payer: Self-pay | Admitting: Internal Medicine

## 2022-07-16 MED ORDER — ALPRAZOLAM 0.25 MG PO TABS: ORAL_TABLET | ORAL | 2 refills | Status: DC

## 2022-07-16 NOTE — Telephone Encounter (Signed)
See 3/22 phone note

## 2022-07-27 NOTE — Progress Notes (Signed)
Cardiology Office Note:    Date:  07/28/2022   ID:  Leah Mata, DOB 06/08/1945, MRN [id number]  PCP:  Corwin Levins, MD  Cardiologist:  Jodelle Red, MD  Referring MD: Corwin Levins, MD   CC: follow up   History of Present Illness:    Leah Mata is a 77 y.o. female with a hx of paroxysmal atrial fib/flutter, hypertension, hyperlipidemia, AAA with ruptured abdominal aortic aneurysm with surgery on 04/29/2018. She had a >30 day admission, with discharge on 05/30/18.  Per recommendations, should have CT scan 5 years post op (2025).   In June 2022, was sitting at the table, got up to answer the phone and fell, breaking her arm. Remembers falling, no confusion after, but unsure if she lost consciousness. Per ER note 08/30/20, noted as near syncope. Had family present, got up immediately. Has had historically some lightheadedness with changing position.   At her last visit 11/2020 she reported well controlled blood pressures at home. Tolerating medications well. She was congratulated on her smoking cessation.  On 10/26/2021 she was admitted to the hospital following presentation with acute severe generalized weakness. In the ER she was found to have a modestly elevated troponin, peaked at 234. Troponin was trended and quickly declined. No evidence of acute ischemia on 12-lead EKG. TTE was negative for acute findings. With cardiology consult, no further inpatient work-up was indicated.  Bilateral inguinal hernia repair with mesh was completed 01/22/2022.  Today, she reports no new cardiovascular concerns. However, her heart rate is bradycardic per her EKG today. She denies any associated symptoms; no palpitations. She used to have issues with shortness of breath but not recently. Sometimes she has issues with LE edema. None currently.  At home, she monitors her blood pressure routinely but does not recall an average reading. Her BP in clinic today is 140/60. She reports that Dr. Sherene Sires took  her off of lisinopril and switched her to irbesartan.  She is just now fully recovering from her hernia repair. Since then she has been struggling with low energy and soreness that she attributed to not being able to move for so long. When she is standing up a lot she develops back pain, somewhat better with sitting down.  She wished to discuss the possibility of going off Eliquis. Now she is not receiving patient assistance since she still has out-of-pocket costs. We reviewed risks and recommendations per current guidelines.  She denies any chest pain, lightheadedness, headaches, syncope, orthopnea, or PND.   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   Other and unspecified hyperlipidemia 08/22/2013   Pre-diabetes     Past Surgical History:  Procedure Laterality Date   ABDOMINAL AORTIC ANEURYSM REPAIR N/A 04/28/2018   Procedure: ANEURYSM ABDOMINAL AORTIC REPAIR WITH HEMASHIELD GOLD VASCULAR GRAFT 22X11 mm;  Surgeon: Larina Earthly, MD;  Location: [facility] OR;  Service: Vascular;  Laterality: N/A;   CARDIAC CATHETERIZATION  03/29/2001   negative   DILATION AND CURETTAGE OF UTERUS  1985   TONSILLECTOMY Bilateral    age 66   TUBAL LIGATION  1985   XI ROBOTIC ASSISTED VENTRAL HERNIA N/A 01/22/2022   Procedure: XI ROBOTIC ASSISTED INCISIONAL HERNIA REPAIR WITH MESH;  Surgeon: Quentin Ore, MD;  Location: [facility];  Service: General;  Laterality: N/A;    Current Medications: Current Outpatient Medications on  File Prior to Visit  Medication Sig   acetaminophen (TYLENOL) 500 MG tablet Take 1,000 mg by mouth every 6 (six) hours as needed for mild pain.   albuterol (PROVENTIL) (2.5 MG/3ML) 0.083% nebulizer solution Take 3 mLs (2.5 mg total) by nebulization every 6 (six) hours as needed for wheezing or shortness of breath.   ALPRAZolam (XANAX) 0.25 MG tablet TAKE 1 TABLET  BY MOUTH TWICE A DAY AS NEEDED FOR ANXIETY   amLODipine (NORVASC) 10 MG tablet TAKE 1 TABLET EVERY DAY   apixaban (ELIQUIS) 5 MG TABS tablet Take 1 tablet (5 mg total) by mouth 2 (two) times daily.   apixaban (ELIQUIS) 5 MG TABS tablet Take 1 tablet (5 mg total) by mouth 2 (two) times daily.   Blood Glucose Monitoring Suppl (TRUE METRIX AIR GLUCOSE METER) w/Device KIT Use as directed four times per day   Blood Glucose Monitoring Suppl (TRUE METRIX METER) DEVI Use as directed once daily E11.9   cholecalciferol (VITAMIN D3) 25 MCG (1000 UNIT) tablet Take 1,000 Units by mouth daily.   citalopram (CELEXA) 10 MG tablet TAKE 1 TABLET EVERY DAY   Fluticasone-Umeclidin-Vilant (TRELEGY ELLIPTA) 100-62.5-25 MCG/ACT AEPB Inhale 1 puff into the lungs daily.   glucose blood (TRUE METRIX BLOOD GLUCOSE TEST) test strip Use as instructed once daily E11.9   irbesartan (AVAPRO) 300 MG tablet Take 1 tablet (300 mg total) by mouth daily.   metoprolol tartrate (LOPRESSOR) 50 MG tablet TAKE 1 TABLET TWICE DAILY   oxyCODONE-acetaminophen (PERCOCET) 5-325 MG tablet Take 1 tablet by mouth every 4 (four) hours as needed for severe pain.   pravastatin (PRAVACHOL) 20 MG tablet TAKE 1 TABLET EVERY DAY   predniSONE (DELTASONE) 10 MG tablet 3 tabs by mouth per day for 3 days,2tabs per day for 3 days,1tab per day for 3 days   TRUEplus Lancets 33G MISC TEST BLOOD SUGAR ONE TIME DAILY AS DIRECTED   No current facility-administered medications on file prior to visit.     Allergies:   Hydrochlorothiazide   Social History   Tobacco Use   Smoking status: Former    Packs/day: 0.50    Years: 30.00    Additional pack years: 0.00    Total pack years: 15.00    Types: Cigarettes    Quit date: 2020    Years since quitting: 4.3   Smokeless tobacco: Never   Tobacco comments:    2 packs per week  Vaping Use   Vaping Use: Never used  Substance Use Topics   Alcohol use: No   Drug use: No    Family History: family history  includes Congestive Heart Failure in her mother; Coronary artery disease in an other family member; Diabetes in her father and mother; Heart attack in her father; Hypertension in her mother. There is no history of Colon cancer or Breast cancer.  ROS:   Please see the history of present illness.   (+) Fatigue (+) Myalgias (+) Back pain Additional pertinent ROS otherwise unremarkable.    EKGs/Labs/Other Studies Reviewed:    The following studies were reviewed today:  Echo  10/27/2021:  1. Left ventricular ejection fraction, by estimation, is 60 to 65%. The  left ventricle has normal function. The left ventricle has no regional  wall motion abnormalities. Left ventricular diastolic parameters are  indeterminate.   2. Right ventricular systolic function is normal. The right ventricular  size is normal. Tricuspid regurgitation signal is inadequate for assessing  PA pressure.   3. There is no  evidence of cardiac tamponade.   4. The mitral valve is normal in structure. No evidence of mitral valve  regurgitation. No evidence of mitral stenosis.   5. The aortic valve was not well visualized. Aortic valve regurgitation  is not visualized. No aortic stenosis is present.   6. The inferior vena cava is normal in size with greater than 50%  respiratory variability, suggesting right atrial pressure of 3 mmHg.   Comparison(s): No significant change from prior study. Prior study done  with echo-contrast.   CTA Chest/Abdomen/Pelvis  10/26/2021: IMPRESSION: 1. Aortic atherosclerosis. No aortic dissection. Previous graft repair of a abdominal aortic aneurysm without complication or recurrence. 2. Lungs are clear. 3. Broad-based complex ventral abdominal wall hernia containing portions of stomach, colon, and small bowel. No proximal obstruction. Similar appearance to previous study.  Echo 04/2018  1. The left ventricle has hyperdynamic systolic function of >65%. The  cavity size is normal. There  is no increased left ventricular wall  thickness. Echo evidence of impaired diastolic relaxation.   2. The right ventricle has normal systolic function. The cavity in normal  in size. There is no increase in right ventricular wall thickness.   3. The mitral valve is normal in structure.   4. The aortic valve has an indeterminant number of cusps.   5. The pulmonic valve is Not well seen. Pulmonic valve regurgitation was  not assessed by color flow Doppler.   6. There is mild dilatation of the aortic root.   7. The interatrial septum was not assessed.   8. Extremely limited due to poor sound wave transmission; definity used;  vigorous LV systolic function; mild diastolic dysfunction.   EKG:  EKG is personally reviewed.   07/28/2022:  sinus bradycardia at 40 bpm with PAC 08/30/20: sinus bradycardia at 59 bpm  Recent Labs: 10/26/2021: B Natriuretic Peptide 253.1 07/12/2022: ALT 8; BUN 17; Creatinine, Ser 1.00; Hemoglobin 13.0; Platelets 243.0; Potassium 4.1; Sodium 135; TSH 1.38   Recent Lipid Panel    Component Value Date/Time   CHOL 143 07/12/2022 1619   TRIG 134.0 07/12/2022 1619   HDL 50.20 07/12/2022 1619   CHOLHDL 3 07/12/2022 1619   VLDL 26.8 07/12/2022 1619   LDLCALC 66 07/12/2022 1619   LDLDIRECT 79.0 04/20/2021 1227    Physical Exam:    VS:  BP (!) 140/60 (BP Location: Left Arm, Patient Position: Sitting, Cuff Size: Large)   Pulse (!) 40   Ht 5\' 6"  (1.676 m)   Wt 184 lb 1.6 oz (83.5 kg)   LMP 03/29/1993 (Approximate)   BMI 29.71 kg/m     Wt Readings from Last 3 Encounters:  07/28/22 184 lb 1.6 oz (83.5 kg)  07/12/22 185 lb (83.9 kg)  06/25/22 186 lb 6.4 oz (84.6 kg)    GEN: Well nourished, well developed in no acute distress HEENT: Normal, moist mucous membranes NECK: No JVD CARDIAC: Bradycardic, normal S1 and S2, no rubs or gallops. No murmur. VASCULAR: Radial and DP pulses 2+ bilaterally. No carotid bruits RESPIRATORY:  Clear to auscultation without rales,  wheezing or rhonchi  ABDOMEN: Soft, non-tender, non-distended.   MUSCULOSKELETAL:  Ambulates independently SKIN: Warm and dry, no edema NEUROLOGIC:  Alert and oriented x 3. No focal neuro deficits noted. PSYCHIATRIC:  Normal affect    ASSESSMENT:    1. Paroxysmal atrial fibrillation (HCC)   2. Essential hypertension   3. History of AAA (abdominal aortic aneurysm) repair   4. Pure hypercholesterolemia   5. Bradycardia  PLAN:    Bradycardia: -sinus, new -discussed options. She will check HR and BP at home as her HR has been normal in the past and she is asymptomatic -return in a few weeks for nurse visit to recheck HR and BP. If HR still low, will need to cut back metoprolol and may need to add additional antihypertensive  Hypertension: elevated today -continue amlodipine 10 mg daily -now on irbesartan (prior was lisinopril), tolerating -currently on metoprolol 50 mg BID. See above re: bradycardia -discussed red flag symptoms that need immediate medical attention   Paroxysmal atrial fib and flutter: CHA2DS2/VAS Stroke Risk Points=4  -in NSR on most recent ECG, denies palpitations or tachycardia -continue apixaban 5 mg BID, requested samples today -on metoprolol for rate control, see above re: bradycardia  History of AAA rupture, with open repair of 8.3 cm infrarenal AAA:  -BP, lipid control as noted -counseled on importance of complete tobacco avoidance   Chronic diastolic dysfunction: -euvolemic  Hypercholesterolemia: with PAD, LDL goal <70.  -last LDL 66 07/12/22 -continue pravastatin 20 mg, though ideally given her ASCVD would like a higher intensity statin. However, she tolerates this dose, so this is a reasonable compromise.  Cardiac risk counseling and prevention recommendations: -recommend heart healthy/Mediterranean diet, with whole grains, fruits, vegetable, fish, lean meats, nuts, and olive oil. Limit salt. -recommend moderate walking, 3-5 times/week for  30-50 minutes each session. Aim for at least 150 minutes.week. Goal should be pace of 3 miles/hours, or walking 1.5 miles in 30 minutes -recommend avoidance of tobacco products. Avoid excess alcohol.  Plan for follow up: 1 year, or sooner as needed.  Jodelle Red, MD, PhD [place]  [facility]   Medication Adjustments/Labs and Tests Ordered: Current medicines are reviewed at length with the patient today.  Concerns regarding medicines are outlined above.   Orders Placed This Encounter  Procedures   EKG 12-Lead   Meds ordered this encounter  Medications   apixaban (ELIQUIS) 5 MG TABS tablet    Sig: Take 1 tablet (5 mg total) by mouth 2 (two) times daily.    Dispense:  28 tablet    Refill:  0    Order Specific Question:   Lot Number?    Answer:   [id number]    Order Specific Question:   Expiration Date?    Answer:   12/27/2023   Patient Instructions  Medication Instructions:  The current medical regimen is effective;  continue present plan and medications.   *If you need a refill on your cardiac medications before your next appointment, please call your pharmacy*   Lab Work: N//A   Testing/Procedures: None   Follow-Up: At [facility], you and your health needs are our priority.  As part of our continuing mission to provide you with exceptional heart care, we have created designated Provider Care Teams.  These Care Teams include your primary Cardiologist (physician) and Advanced Practice Providers (APPs -  Physician Assistants and Nurse Practitioners) who all work together to provide you with the care you need, when you need it.  We recommend signing up for the patient portal called "MyChart".  Sign up information is provided on this After Visit Summary.  MyChart is used to connect with patients for Virtual Visits (Telemedicine).  Patients are able to view lab/test results, encounter notes, upcoming appointments, etc.  Non-urgent messages can be sent to  your provider as well.   To learn more about what you can do with MyChart, go to [email or web address].  Your next appointment:   2 week(s)  Provider:   Nurse visit    F/U 1 yr. Dr. Cristal Deer  Other Instructions Document Blood pressures on logs provided.    I,Mathew Stumpf,acting as a Neurosurgeon for [employer], MD.,have documented all relevant documentation on the behalf of Jodelle Red, MD,as directed by  Jodelle Red, MD while in the presence of Jodelle Red, MD.  I, Jodelle Red, MD, have reviewed all documentation for this visit. The documentation on 07/28/22 for the exam, diagnosis, procedures, and orders are all accurate and complete.   Signed, Jodelle Red, MD PhD 07/28/2022  [facility]

## 2022-07-28 ENCOUNTER — Encounter (HOSPITAL_BASED_OUTPATIENT_CLINIC_OR_DEPARTMENT_OTHER): Payer: Self-pay | Admitting: Cardiology

## 2022-07-28 ENCOUNTER — Ambulatory Visit (HOSPITAL_BASED_OUTPATIENT_CLINIC_OR_DEPARTMENT_OTHER): Payer: Medicare HMO | Admitting: Cardiology

## 2022-07-28 VITALS — BP 140/60 | HR 40 | Ht 66.0 in | Wt 184.1 lb

## 2022-07-28 DIAGNOSIS — I480 Paroxysmal atrial fibrillation: Secondary | ICD-10-CM

## 2022-07-28 DIAGNOSIS — E7800 Pure hypercholesterolemia, unspecified: Secondary | ICD-10-CM | POA: Diagnosis not present

## 2022-07-28 DIAGNOSIS — Z9889 Other specified postprocedural states: Secondary | ICD-10-CM | POA: Diagnosis not present

## 2022-07-28 DIAGNOSIS — R001 Bradycardia, unspecified: Secondary | ICD-10-CM

## 2022-07-28 DIAGNOSIS — I739 Peripheral vascular disease, unspecified: Secondary | ICD-10-CM | POA: Diagnosis not present

## 2022-07-28 DIAGNOSIS — I10 Essential (primary) hypertension: Secondary | ICD-10-CM | POA: Diagnosis not present

## 2022-07-28 MED ORDER — APIXABAN 5 MG PO TABS: 5.0000 mg | ORAL_TABLET | Freq: Two times a day (BID) | ORAL | 0 refills | Status: DC

## 2022-07-28 NOTE — Patient Instructions (Signed)
Medication Instructions:  The current medical regimen is effective;  continue present plan and medications.   *If you need a refill on your cardiac medications before your next appointment, please call your pharmacy*   Lab Work: N//A   Testing/Procedures: None   Follow-Up: At [facility], you and your health needs are our priority.  As part of our continuing mission to provide you with exceptional heart care, we have created designated Provider Care Teams.  These Care Teams include your primary Cardiologist (physician) and Advanced Practice Providers (APPs -  Physician Assistants and Nurse Practitioners) who all work together to provide you with the care you need, when you need it.  We recommend signing up for the patient portal called "MyChart".  Sign up information is provided on this After Visit Summary.  MyChart is used to connect with patients for Virtual Visits (Telemedicine).  Patients are able to view lab/test results, encounter notes, upcoming appointments, etc.  Non-urgent messages can be sent to your provider as well.   To learn more about what you can do with MyChart, go to [email or web address].    Your next appointment:   2 week(s)  Provider:   Nurse visit    F/U 1 yr. Dr. Cristal Deer  Other Instructions Document Blood pressures on logs provided.

## 2022-08-12 ENCOUNTER — Ambulatory Visit (INDEPENDENT_AMBULATORY_CARE_PROVIDER_SITE_OTHER): Payer: Medicare HMO | Admitting: *Deleted

## 2022-08-12 VITALS — BP 120/60 | HR 45 | Ht 66.0 in | Wt 181.9 lb

## 2022-08-12 DIAGNOSIS — I480 Paroxysmal atrial fibrillation: Secondary | ICD-10-CM | POA: Diagnosis not present

## 2022-08-12 NOTE — Patient Instructions (Addendum)
Medication Instructions:  Your physician has recommended you make the following change in your medication:   REDUCE Metoprolol to 25mg  twice daily (TAKE 1/2 OF THE 50 MG TABLETS TWICE A DAY) May use  up your current tablets by taking a half tablet twice per day We have sent a new prescription to the pharmacy for the 25mg  tablet  *If you need a refill on your cardiac medications before your next appointment, please call your pharmacy*  Follow-Up: At [facility], you and your health needs are our priority.  As part of our continuing mission to provide you with exceptional heart care, we have created designated Provider Care Teams.  These Care Teams include your primary Cardiologist (physician) and Advanced Practice Providers (APPs -  Physician Assistants and Nurse Practitioners) who all work together to provide you with the care you need, when you need it.  We recommend signing up for the patient portal called "MyChart".  Sign up information is provided on this After Visit Summary.  MyChart is used to connect with patients for Virtual Visits (Telemedicine).  Patients are able to view lab/test results, encounter notes, upcoming appointments, etc.  Non-urgent messages can be sent to your provider as well.   To learn more about what you can do with MyChart, go to [email or web address].    Your next appointment:   09/03/2022 11:40 am with Dr Cristal Deer   Other Instructions  Tips to Measure your Blood Pressure Correctly  Please bring blood pressure cuff to next office visit.   Here's what you can do to ensure a correct reading:  Don't drink a caffeinated beverage or smoke during the 30 minutes before the test.  Sit quietly for five minutes before the test begins.  During the measurement, sit in a chair with your feet on the floor and your arm supported so your elbow is at about heart level.  The inflatable part of the cuff should completely cover at least 80% of your upper arm, and  the cuff should be placed on bare skin, not over a shirt.  Don't talk during the measurement.  Have your blood pressure measured twice, with a brief break in between. If the readings are different by 5 points or more, have it done a third time.   Blood pressure categories  Blood pressure category SYSTOLIC (upper number)  DIASTOLIC (lower number)  Normal Less than 120 mm Hg and Less than 80 mm Hg  Elevated 120-129 mm Hg and Less than 80 mm Hg  High blood pressure: Stage 1 hypertension 130-139 mm Hg or 80-89 mm Hg  High blood pressure: Stage 2 hypertension 140 mm Hg or higher or 90 mm Hg or higher  Hypertensive crisis (consult your doctor immediately) Higher than 180 mm Hg and/or Higher than 120 mm Hg  Source: American Heart Association and American Stroke Association. For more on getting your blood pressure under control, buy Controlling Your Blood Pressure, a Special Health Report from [facility].   Blood Pressure Log   Date   Time  Blood Pressure  Example: Nov 1 9 AM 124/78

## 2022-08-12 NOTE — Progress Notes (Signed)
   Nurse Visit   Date of Encounter: 08/12/2022 ID: Leah Mata, DOB 03-Jul-1945, MRN [id number]  PCP:  Corwin Levins, MD   [facility] Providers Cardiologist:  Jodelle Red, MD      Visit Details   VS:  BP 120/60   Pulse (!) 45   Ht 5\' 6"  (1.676 m)   Wt 181 lb 14.4 oz (82.5 kg)   LMP 03/29/1993 (Approximate)   BMI 29.36 kg/m  , BMI Body mass index is 29.36 kg/m.  Wt Readings from Last 3 Encounters:  08/12/22 181 lb 14.4 oz (82.5 kg)  07/28/22 184 lb 1.6 oz (83.5 kg)  07/12/22 185 lb (83.9 kg)     Reason for visit: HEART RATE AND BLOOD PRESSURE   Performed today: Vitals, Provider consulted:CAITLIN W NP , and Education Changes (medications, testing, etc.) : DECREASE METOPROLOL TO 25 MG TWICE A DAY Length of Visit: 15 minutes    Medications Adjustments/Labs and Tests Ordered: No orders of the defined types were placed in this encounter.  No orders of the defined types were placed in this encounter.    Tatsuko, Christians, LPN  07/05/8117 1:47 PM

## 2022-09-03 ENCOUNTER — Ambulatory Visit (HOSPITAL_BASED_OUTPATIENT_CLINIC_OR_DEPARTMENT_OTHER): Payer: Medicare HMO | Admitting: Cardiology

## 2022-09-10 ENCOUNTER — Other Ambulatory Visit: Payer: Self-pay | Admitting: Internal Medicine

## 2022-09-13 ENCOUNTER — Other Ambulatory Visit: Payer: Self-pay | Admitting: Internal Medicine

## 2022-10-22 ENCOUNTER — Ambulatory Visit (HOSPITAL_BASED_OUTPATIENT_CLINIC_OR_DEPARTMENT_OTHER): Payer: Medicare HMO | Admitting: Family

## 2022-10-22 NOTE — Progress Notes (Deleted)
Cardiology Office Note:  .   Date:  10/22/2022  ID:  Leah Mata, DOB September 28, 1945, MRN [id number] PCP: Corwin Levins, MD  [facility] Providers Cardiologist:  Jodelle Red, MD { Click to update primary MD,subspecialty MD or APP then REFRESH:1}   History of Present Illness: .   Leah Mata is a 77 y.o. 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  Per recommendations should have CT scan 5 years post op (2025).  Seen by Dr. Cristal Deer 07/28/2022 noting she was just recovered from inguinal hernia repair 01/22/2022.  Her pulmonologist had transitioned her from lisinopril to irbesartan.  She inquired about stopping Eliquis and the importance of OAC was discussed.  She was recommended monitor BP and heart rate due to bradycardia, at nurse visit 08/12/2022 metoprolol reduced to 25 mg twice daily.  Presents today for follow-up.  ROS: Please see the history of present illness.    All other systems reviewed and are negative.   Studies Reviewed: .        Cardiac Studies & Procedures       ECHOCARDIOGRAM  ECHOCARDIOGRAM COMPLETE 10/27/2021  Narrative ECHOCARDIOGRAM REPORT    Patient Name:   Leah Mata Date of Exam: 10/27/2021 Medical Rec #:  [id number]     Height:       66.0 in Accession #:    [id number]    Weight:       190.8 lb Date of Birth:  Dec 14, 1945      BSA:          1.960 m Patient Age:    76 years      BP:           141/67 mmHg Patient Gender: F             HR:           59 bpm. Exam Location:  Inpatient  Procedure: 2D Echo, Cardiac Doppler and Color Doppler  Indications:    Elevated troponin  History:        Patient has prior history of Echocardiogram examinations, most recent 05/03/2018. COPD; Risk  Factors:Hypertension.  Sonographer:    Ross Ludwig RDCS (AE) Referring Phys: [id number] CAROLE N HALL  IMPRESSIONS   1. Left ventricular ejection fraction, by estimation, is 60 to 65%. The left ventricle has normal function. The left ventricle has no regional wall motion abnormalities. Left ventricular diastolic parameters are indeterminate. 2. Right ventricular systolic function is normal. The right ventricular size is normal. Tricuspid regurgitation signal is inadequate for assessing PA pressure. 3. There is no evidence of cardiac tamponade. 4. The mitral valve is normal in structure. No evidence of mitral valve regurgitation. No evidence of mitral stenosis. 5. The aortic valve was not well visualized. Aortic valve regurgitation is not visualized. No aortic stenosis is present. 6. The inferior vena cava is normal in size with greater than 50% respiratory variability, suggesting right atrial pressure of 3 mmHg.  Comparison(s): No significant change from prior study. Prior study done with echo-contrast.  FINDINGS Left Ventricle: Left ventricular ejection fraction, by estimation, is 60 to 65%. The left ventricle has normal function. The left ventricle has no regional wall motion abnormalities. The left ventricular internal cavity size was normal in size. There is no left ventricular hypertrophy. Left ventricular diastolic parameters are indeterminate.  Right Ventricle: The right ventricular size is normal. No increase in right ventricular wall thickness. Right ventricular systolic function is normal. Tricuspid regurgitation signal is inadequate for assessing PA pressure.  Left Atrium: Left atrial size was normal in size.  Right Atrium: Right atrial size was normal in size.  Pericardium: Trivial pericardial effusion is present. The pericardial effusion is circumferential. There is no evidence of cardiac tamponade. Presence of epicardial fat layer.  Mitral Valve: The mitral valve is normal in  structure. No evidence of mitral valve regurgitation. No evidence of mitral valve stenosis.  Tricuspid Valve: The tricuspid valve is normal in structure. Tricuspid valve regurgitation is trivial. No evidence of tricuspid stenosis.  Aortic Valve: The aortic valve was not well visualized. Aortic valve regurgitation is not visualized. No aortic stenosis is present. Aortic valve mean gradient measures 3.0 mmHg. Aortic valve peak gradient measures 5.0 mmHg. Aortic valve area, by VTI measures 2.79 cm.  Pulmonic Valve: The pulmonic valve was not well visualized. Pulmonic valve regurgitation is not visualized.  Aorta: The aortic root, ascending aorta and aortic arch are all structurally normal, with no evidence of dilitation or obstruction.  Venous: The inferior vena cava is normal in size with greater than 50% respiratory variability, suggesting right atrial pressure of 3 mmHg.  IAS/Shunts: No atrial level shunt detected by color flow Doppler.   LEFT VENTRICLE PLAX 2D LVIDd:         4.00 cm   Diastology LVIDs:         2.20 cm   LV e' medial:    6.96 cm/s LV PW:         0.90 cm   LV E/e' medial:  14.7 LV IVS:        1.00 cm   LV e' lateral:   6.96 cm/s LVOT diam:     2.00 cm   LV E/e' lateral: 14.7 LV SV:         78 LV SV Index:   40 LVOT Area:     3.14 cm   RIGHT VENTRICLE             IVC RV Basal diam:  2.80 cm     IVC diam: 1.50 cm RV S prime:     13.70 cm/s TAPSE (M-mode): 1.8 cm  LEFT ATRIUM           Index        RIGHT ATRIUM           Index LA diam:      3.40 cm 1.73 cm/m   RA Area:     15.40 cm LA Vol (A2C): 50.7 ml 25.87 ml/m  RA Volume:   39.40 ml  20.10 ml/m LA Vol (A4C): 62.0 ml 31.63 ml/m AORTIC VALVE AV Area (Vmax):    3.00 cm AV Area (Vmean):   2.82 cm AV Area (VTI):     2.79 cm AV Vmax:           112.00 cm/s AV Vmean:          78.900 cm/s AV VTI:            0.279 m AV Peak Grad:      5.0 mmHg AV Mean Grad:      3.0 mmHg LVOT Vmax:         107.00  cm/s LVOT Vmean:        70.700 cm/s LVOT VTI:          0.248 m LVOT/AV VTI  ratio: 0.89  AORTA Ao Root diam: 3.60 cm Ao Asc diam:  3.40 cm  MITRAL VALVE MV Area (PHT): 2.33 cm     SHUNTS MV Decel Time: 325 msec     Systemic VTI:  0.25 m MV E velocity: 102.00 cm/s  Systemic Diam: 2.00 cm MV A velocity: 82.30 cm/s MV E/A ratio:  1.24  Riley Lam MD Electronically signed by Riley Lam MD Signature Date/Time: 10/27/2021/11:57:20 AM    Final             Risk Assessment/Calculations:    CHA2DS2-VASc Score =    {Confirm score is correct.  If not, click here to update score.  REFRESH note.  :1} This indicates a  % annual risk of stroke. The patient's score is based upon:    {This patient has a significant risk of stroke if diagnosed with atrial fibrillation.  Please consider VKA or DOAC agent for anticoagulation if the bleeding risk is acceptable.   You can also use the SmartPhrase .[email or web address] for documentation.   :[id number]} No BP recorded.  {Refresh Note OR Click here to enter BP  :1}***       Physical Exam:   VS:  LMP 03/29/1993 (Approximate)    Wt Readings from Last 3 Encounters:  08/12/22 181 lb 14.4 oz (82.5 kg)  07/28/22 184 lb 1.6 oz (83.5 kg)  07/12/22 185 lb (83.9 kg)    GEN: Well nourished, well developed in no acute distress NECK: No JVD; No carotid bruits CARDIAC: ***RRR, no murmurs, rubs, gallops RESPIRATORY:  Clear to auscultation without rales, wheezing or rhonchi  ABDOMEN: Soft, non-tender, non-distended EXTREMITIES:  No edema; No deformity   ASSESSMENT AND PLAN: .    Bradycardia- HTN- PAF/flutter/hypercoagulable state- History of AAA rupture with open repair of 8.3 cm infrarenal AAA-due for CT 2025 Chronic diastolic dysfunction HLD, LDL goal less than 70/coronary calcification on CT/aortic atherosclerosis-    {Are you ordering a CV Procedure (e.g. stress test, cath, DCCV, TEE, etc)?   Press F2        :[id number]}  Dispo:  ***  Signed, Alver Sorrow, NP

## 2022-11-19 ENCOUNTER — Other Ambulatory Visit (HOSPITAL_BASED_OUTPATIENT_CLINIC_OR_DEPARTMENT_OTHER): Payer: Self-pay | Admitting: Cardiology

## 2022-11-19 NOTE — Telephone Encounter (Signed)
Please review for refill. Thank you! 

## 2022-11-19 NOTE — Telephone Encounter (Signed)
Prescription refill request for Eliquis received. Indication:afib Last office visit:5/24 Scr:1.00  4/24 Age: 77 Weight:82.5  kg  Prescription refilled

## 2023-01-07 ENCOUNTER — Ambulatory Visit (INDEPENDENT_AMBULATORY_CARE_PROVIDER_SITE_OTHER): Payer: Medicare HMO | Admitting: *Deleted

## 2023-01-07 VITALS — BP 165/82 | Ht 66.0 in | Wt 170.0 lb

## 2023-01-07 DIAGNOSIS — Z1231 Encounter for screening mammogram for malignant neoplasm of breast: Secondary | ICD-10-CM | POA: Diagnosis not present

## 2023-01-07 DIAGNOSIS — Z1211 Encounter for screening for malignant neoplasm of colon: Secondary | ICD-10-CM

## 2023-01-07 DIAGNOSIS — Z0000 Encounter for general adult medical examination without abnormal findings: Secondary | ICD-10-CM | POA: Diagnosis not present

## 2023-01-07 NOTE — Progress Notes (Signed)
Subjective:   Leah Mata is a 77 y.o. female who presents for Medicare Annual (Subsequent) preventive examination.  Visit Complete: Virtual I connected with  Leah Mata on 01/07/23 by a audio enabled telemedicine application and verified that I am speaking with the correct person using two identifiers.  Patient Location: Home  Provider Location: Office/Clinic  I discussed the limitations of evaluation and management by telemedicine. The patient expressed understanding and agreed to proceed.  Vital Signs: Because this visit was a virtual/telehealth visit, some criteria may be missing or patient reported. Any vitals not documented were not able to be obtained and vitals that have been documented are patient reported.  Patient Medicare AWV questionnaire was not completed by the patient.  Cardiac Risk Factors include: advanced age (>62men, >5 women);hypertension;sedentary lifestyle;dyslipidemia    Please see problem list for additional risk factors.   Vital Signs: Because this visit was a virtual/telehealth visit, some criteria may be missing or patient reported. Any vitals not documented were not able to be obtained and vitals that have been documented are patient reported.   Objective:    Today's Vitals   01/07/23 1023 01/07/23 1038  BP: (!) 180/82 (!) 165/82  Weight: 170 lb (77.1 kg)   Height: 5\' 6"  (1.676 m)    Body mass index is 27.44 kg/m.     01/07/2023   10:33 AM 01/08/2022   10:05 AM 10/27/2021    5:00 AM 08/30/2020    2:03 PM 09/06/2019   10:00 AM 06/17/2018    1:16 AM 04/30/2018   12:27 PM  Advanced Directives  Does Patient Have a Medical Advance Directive? No No No No No No   Would patient like information on creating a medical advance directive? No - Patient declined No - Patient declined No - Patient declined No - Patient declined No - Patient declined No - Patient declined No - Patient declined    Current Medications (verified) Outpatient Encounter  Medications as of 01/07/2023  Medication Sig   acetaminophen (TYLENOL) 500 MG tablet Take 1,000 mg by mouth every 6 (six) hours as needed for mild pain.   albuterol (PROVENTIL) (2.5 MG/3ML) 0.083% nebulizer solution Take 3 mLs (2.5 mg total) by nebulization every 6 (six) hours as needed for wheezing or shortness of breath.   ALPRAZolam (XANAX) 0.25 MG tablet TAKE 1 TABLET BY MOUTH TWICE A DAY AS NEEDED FOR ANXIETY   amLODipine (NORVASC) 10 MG tablet TAKE 1 TABLET EVERY DAY   apixaban (ELIQUIS) 5 MG TABS tablet Take 1 tablet (5 mg total) by mouth 2 (two) times daily.   cholecalciferol (VITAMIN D3) 25 MCG (1000 UNIT) tablet Take 1,000 Units by mouth daily.   citalopram (CELEXA) 10 MG tablet TAKE 1 TABLET EVERY DAY   irbesartan (AVAPRO) 300 MG tablet Take 1 tablet (300 mg total) by mouth daily.   metoprolol tartrate (LOPRESSOR) 50 MG tablet TAKE 1 TABLET TWICE DAILY   pravastatin (PRAVACHOL) 20 MG tablet TAKE 1 TABLET EVERY DAY   apixaban (ELIQUIS) 5 MG TABS tablet Take 1 tablet (5 mg total) by mouth 2 (two) times daily. (Patient not taking: Reported on 01/07/2023)   apixaban (ELIQUIS) 5 MG TABS tablet Take 1 tablet (5 mg total) by mouth 2 (two) times daily. (Patient not taking: Reported on 01/07/2023)   Blood Glucose Monitoring Suppl (TRUE METRIX AIR GLUCOSE METER) w/Device KIT Use as directed four times per day (Patient not taking: Reported on 01/07/2023)   Blood Glucose Monitoring Suppl (TRUE METRIX  METER) DEVI Use as directed once daily E11.9 (Patient not taking: Reported on 01/07/2023)   Fluticasone-Umeclidin-Vilant (TRELEGY ELLIPTA) 100-62.5-25 MCG/ACT AEPB Inhale 1 puff into the lungs daily. (Patient not taking: Reported on 01/07/2023)   glucose blood (TRUE METRIX BLOOD GLUCOSE TEST) test strip Use as instructed once daily E11.9 (Patient not taking: Reported on 01/07/2023)   TRUEplus Lancets 33G MISC TEST BLOOD SUGAR ONE TIME DAILY AS DIRECTED (Patient not taking: Reported on 01/07/2023)   No  facility-administered encounter medications on file as of 01/07/2023.    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   Procedure: ANEURYSM ABDOMINAL AORTIC REPAIR WITH HEMASHIELD GOLD VASCULAR GRAFT 22X11 mm;  Surgeon: Larina Earthly, MD;  Location: [facility] OR;  Service: Vascular;  Laterality: N/A;   CARDIAC CATHETERIZATION  03/29/2001   negative   DILATION AND CURETTAGE OF UTERUS  1985   TONSILLECTOMY Bilateral    age 77   TUBAL LIGATION  1985   XI ROBOTIC ASSISTED VENTRAL HERNIA N/A 01/22/2022   Procedure: XI ROBOTIC ASSISTED INCISIONAL HERNIA REPAIR WITH MESH;  Surgeon: Quentin Ore, MD;  Location: [facility];  Service: General;  Laterality: N/A;   Family History  Problem Relation Age of Onset   Coronary artery disease Other        female 1st degree relative <60 and female 1st degree relative <50   Diabetes Father    Heart attack Father    Diabetes Mother    Hypertension Mother    Congestive Heart Failure Mother    Colon cancer Neg Hx    Breast cancer Neg Hx    Social History   Socioeconomic History   Marital status: Widowed    Spouse name: Not on file   Number of children: Not on file   Years of education: Not on file   Highest education level: Not on file  Occupational History   Not on file  Tobacco Use   Smoking status: Former    Current packs/day: 0.00    Average packs/day: 0.5 packs/day for 30.0 years (15.0 ttl pk-yrs)    Types: Cigarettes    Start date: 66    Quit date: 2020    Years since quitting: 4.7   Smokeless tobacco: Never   Tobacco comments:    2 packs per week  Vaping Use   Vaping status: Never Used   Substance and Sexual Activity   Alcohol use: No   Drug use: No   Sexual activity: Not Currently    Birth control/protection: Surgical    Comment: BTL  Other Topics Concern   Not on file  Social History Narrative   Not on file   Social Determinants of Health   Financial Resource Strain: Low Risk  (01/07/2023)   Overall Financial Resource Strain (CARDIA)    Difficulty of Paying Living Expenses: Not very hard  Food Insecurity: No Food Insecurity (01/07/2023)   Hunger Vital Sign    Worried About Running Out of Food in the Last Year: Never true    Ran Out of Food in the Last Year: Never true  Transportation Needs: No Transportation Needs (01/07/2023)   PRAPARE - Transportation  Lack of Transportation (Medical): No    Lack of Transportation (Non-Medical): No  Physical Activity: Inactive (01/07/2023)   Exercise Vital Sign    Days of Exercise per Week: 0 days    Minutes of Exercise per Session: 0 min  Stress: No Stress Concern Present (01/07/2023)   [employer] of Occupational Health - Occupational Stress Questionnaire    Feeling of Stress : Not at all  Social Connections: Socially Isolated (01/07/2023)   Social Connection and Isolation Panel [NHANES]    Frequency of Communication with Friends and Family: More than three times a week    Frequency of Social Gatherings with Friends and Family: More than three times a week    Attends Religious Services: Never    Database administrator or Organizations: No    Attends Banker Meetings: Never    Marital Status: Widowed    Tobacco Counseling Counseling given: Not Answered Tobacco comments: 2 packs per week   Clinical Intake:  Pre-visit preparation completed: Yes  Pain : No/denies pain     Nutritional Status: BMI 25 -29 Overweight Nutritional Risks: None Diabetes: No  How often do you need to have someone help you when you read instructions, pamphlets, or other written materials from your doctor or  pharmacy?: 1 - Never What is the last grade level you completed in school?: 11th grade  Interpreter Needed?: No      Activities of Daily Living    01/07/2023   10:27 AM 01/08/2022   10:08 AM  In your present state of health, do you have any difficulty performing the following activities:  Hearing? 1   Vision? 0   Difficulty concentrating or making decisions? 0   Walking or climbing stairs? 0   Dressing or bathing? 0   Doing errands, shopping? 0 0  Preparing Food and eating ? N   Using the Toilet? N   In the past six months, have you accidently leaked urine? Y   Do you have problems with loss of bowel control? N   Managing your Medications? N   Managing your Finances? N   Housekeeping or managing your Housekeeping? N     Patient Care Team: Corwin Levins, MD as PCP - General Jodelle Red, MD as PCP - Cardiology (Cardiology) Jerene Bears, MD as Consulting Physician (Gynecology) Stechschulte, Hyman Hopes, MD as Consulting Physician (Surgery) Early, Kristen Loader, MD (Inactive) as Consulting Physician (Vascular Surgery) Arita Miss, MD as Attending Physician (Nephrology)  Indicate any recent Medical Services you may have received from other than [facility] providers in the past year (date may be approximate).     Assessment:   This is a routine wellness examination for [place].  Hearing/Vision screen Vision Screening - Comments:: Annual eye exam, Dr. Gildardo Cranker   Goals Addressed   None   Depression Screen    01/07/2023   10:34 AM 01/07/2023   10:32 AM 07/12/2022    3:52 PM 02/03/2022   10:36 AM 01/29/2022    1:50 PM 06/22/2021    1:20 PM 04/20/2021   11:54 AM  PHQ 2/9 Scores  PHQ - 2 Score 0 0 0 0 1 2 0  PHQ- 9 Score    0  2     Fall Risk    01/07/2023   10:28 AM 07/12/2022    3:52 PM 02/03/2022   10:36 AM 06/22/2021    1:20 PM 04/20/2021   11:54 AM  Fall Risk   Falls in the past  year? 0 0 0 1 1  Number falls in past yr: 0 0  0 0  Injury with Fall? 0 0  1 1  Risk  for fall due to : No Fall Risks  No Fall Risks    Follow up Falls evaluation completed  Falls evaluation completed      MEDICARE RISK AT HOME: Medicare Risk at Home Any stairs in or around the home?: Yes If so, are there any without handrails?: Yes Home free of loose throw rugs in walkways, pet beds, electrical cords, etc?: Yes Adequate lighting in your home to reduce risk of falls?: Yes Life alert?: No Use of a cane, walker or w/c?: No Grab bars in the bathroom?: Yes Shower chair or bench in shower?: Yes Elevated toilet seat or a handicapped toilet?: No  TIMED UP AND GO:  Was the test performed?  No    Cognitive Function:        01/07/2023   10:34 AM 09/06/2019   10:02 AM  6CIT Screen  What Year? 0 points 0 points  What month? 0 points 0 points  What time? 3 points 3 points  Count back from 20 0 points 0 points  Months in reverse 0 points 4 points  Repeat phrase 2 points 2 points  Total Score 5 points 9 points    Immunizations Immunization History  Administered Date(s) Administered   Fluad Quad(high Dose 65+) 01/20/2019, 04/20/2021, 02/03/2022   Influenza, High Dose Seasonal PF 03/12/2017, 04/06/2018, 03/07/2019   Influenza,inj,Quad PF,6+ Mos 12/01/2012, 01/31/2014, 02/24/2016   Influenza-Unspecified 11/28/2011, 01/11/2015   Pneumococcal Conjugate-13 08/15/2014   Pneumococcal Polysaccharide-23 05/30/2012   Td 11/28/2006   Tdap 09/07/2016   Zoster, Live 11/29/2008    TDAP status: Up to date  Flu Vaccine status: Due, Education has been provided regarding the importance of this vaccine. Advised may receive this vaccine at local pharmacy or Health Dept. Aware to provide a copy of the vaccination record if obtained from local pharmacy or Health Dept. Verbalized acceptance and understanding.  Pneumococcal vaccine status: Up to date  Covid-19 vaccine status: Completed vaccines  Qualifies for Shingles Vaccine? Yes   Zostavax completed No   Shingrix Completed?: No.     Education has been provided regarding the importance of this vaccine. Patient has been advised to call insurance company to determine out of pocket expense if they have not yet received this vaccine. Advised may also receive vaccine at local pharmacy or Health Dept. Verbalized acceptance and understanding.  Screening Tests Health Maintenance  Topic Date Due   Colonoscopy  07/08/2022   COVID-19 Vaccine (1 - 2023-24 season) Never done   INFLUENZA VACCINE  06/27/2023 (Originally 10/28/2022)   Zoster Vaccines- Shingrix (1 of 2) 10/07/2023 (Originally 04/03/1997)   Medicare Annual Wellness (AWV)  01/07/2024   DTaP/Tdap/Td (3 - Td or Tdap) 09/08/2026   Pneumonia Vaccine 10+ Years old  Completed   DEXA SCAN  Completed   Hepatitis C Screening  Completed   HPV VACCINES  Aged Out    Health Maintenance  Health Maintenance Due  Topic Date Due   Colonoscopy  07/08/2022   COVID-19 Vaccine (1 - 2023-24 season) Never done    Colorectal cancer screening: Referral to GI placed 01/07/2023. Pt aware the office will call re: appt.  Mammogram status: Ordered 01/07/2023. Pt provided with contact info and advised to call to schedule appt.   Bone Density status: Completed 10/13/2020. Results reflect: Bone density results: OSTEOPOROSIS. Repeat every 2 years.  Lung  Cancer Screening: (Low Dose CT Chest recommended if Age 41-80 years, 20 pack-year currently smoking OR have quit w/in 15years.) does not qualify.   Lung Cancer Screening Referral: n/a  Additional Screening:  Hepatitis C Screening: does qualify; Completed 09/06/2019  Vision Screening: Recommended annual ophthalmology exams for early detection of glaucoma and other disorders of the eye. Is the patient up to date with their annual eye exam?  Yes  Who is the provider or what is the name of the office in which the patient attends annual eye exams? Dr. Gildardo Cranker If pt is not established with a provider, would they like to be referred to a provider  to establish care? No .   Dental Screening: Recommended annual dental exams for proper oral hygiene  Diabetic Foot Exam: patient is not a diabetic   Community Resource Referral / Chronic Care Management: CRR required this visit?  No   CCM required this visit?  No     Plan:     I have personally reviewed and noted the following in the patient's chart:   Medical and social history Use of alcohol, tobacco or illicit drugs  Current medications and supplements including opioid prescriptions. Patient is not currently taking opioid prescriptions. Functional ability and status Nutritional status Physical activity Advanced directives List of other physicians Hospitalizations, surgeries, and ER visits in previous 12 months Vitals Screenings to include cognitive, depression, and falls Referrals and appointments  In addition, I have reviewed and discussed with patient certain preventive protocols, quality metrics, and best practice recommendations. A written personalized care plan for preventive services as well as general preventive health recommendations were provided to patient.     Tamela Oddi, CMA   01/07/2023   Non face to face 35 minutes  After Visit Summary: (Mail) Due to this being a telephonic visit, the after visit summary with patients personalized plan was offered to patient via mail   Nurse Notes:  Ms. Kangas , Thank you for taking time to come for your Medicare Wellness Visit. I appreciate your ongoing commitment to your health goals. Please review the following plan we discussed and let me know if I can assist you in the future.   These are the goals we discussed:  Goals      Weight (lb) < 154 lb (69.9 kg)     Would like to lose 10 pounds by increasing activity.         This is a list of the screening recommended for you and due dates:  Health Maintenance  Topic Date Due   Colon Cancer Screening  07/08/2022   COVID-19 Vaccine (1 - 2023-24 season)  Never done   Flu Shot  06/27/2023*   Zoster (Shingles) Vaccine (1 of 2) 10/07/2023*   Medicare Annual Wellness Visit  01/07/2024   DTaP/Tdap/Td vaccine (3 - Td or Tdap) 09/08/2026   Pneumonia Vaccine  Completed   DEXA scan (bone density measurement)  Completed   Hepatitis C Screening  Completed   HPV Vaccine  Aged Out  *Topic was postponed. The date shown is not the original due date.

## 2023-01-07 NOTE — Patient Instructions (Addendum)
Leah Mata , Thank you for taking time to come for your Medicare Wellness Visit. I appreciate your ongoing commitment to your health goals. Please review the following plan we discussed and let me know if I can assist you in the future.   These are the goals we discussed:  Goals      Weight (lb) < 154 lb (69.9 kg)     Would like to lose 10 pounds by increasing activity.         This is a list of the screening recommended for you and due dates:  Health Maintenance  Topic Date Due   Colon Cancer Screening  07/08/2022   COVID-19 Vaccine (1 - 2023-24 season) Never done   Flu Shot  06/27/2023*   Zoster (Shingles) Vaccine (1 of 2) 10/07/2023*   Medicare Annual Wellness Visit  01/07/2024   DTaP/Tdap/Td vaccine (3 - Td or Tdap) 09/08/2026   Pneumonia Vaccine  Completed   DEXA scan (bone density measurement)  Completed   Hepatitis C Screening  Completed   HPV Vaccine  Aged Out  *Topic was postponed. The date shown is not the original due date.     Health Maintenance, Female Adopting a healthy lifestyle and getting preventive care are important in promoting health and wellness. Ask your health care provider about: The right schedule for you to have regular tests and exams. Things you can do on your own to prevent diseases and keep yourself healthy. What should I know about diet, weight, and exercise? Eat a healthy diet  Eat a diet that includes plenty of vegetables, fruits, low-fat dairy products, and lean protein. Do not eat a lot of foods that are high in solid fats, added sugars, or sodium. Maintain a healthy weight Body mass index (BMI) is used to identify weight problems. It estimates body fat based on height and weight. Your health care provider can help determine your BMI and help you achieve or maintain a healthy weight. Get regular exercise Get regular exercise. This is one of the most important things you can do for your health. Most adults should: Exercise for at least 150  minutes each week. The exercise should increase your heart rate and make you sweat (moderate-intensity exercise). Do strengthening exercises at least twice a week. This is in addition to the moderate-intensity exercise. Spend less time sitting. Even light physical activity can be beneficial. Watch cholesterol and blood lipids Have your blood tested for lipids and cholesterol at 75 years of age, then have this test every 5 years. Have your cholesterol levels checked more often if: Your lipid or cholesterol levels are high. You are older than 75 years of age. You are at high risk for heart disease. What should I know about cancer screening? Depending on your health history and family history, you may need to have cancer screening at various ages. This may include screening for: Breast cancer. Cervical cancer. Colorectal cancer. Skin cancer. Lung cancer. What should I know about heart disease, diabetes, and high blood pressure? Blood pressure and heart disease High blood pressure causes heart disease and increases the risk of stroke. This is more likely to develop in people who have high blood pressure readings or are overweight. Have your blood pressure checked: Every 3-5 years if you are 9-61 years of age. Every year if you are 62 years old or older. Diabetes Have regular diabetes screenings. This checks your fasting blood sugar level. Have the screening done: Once every three years after age 72  if you are at a normal weight and have a low risk for diabetes. More often and at a younger age if you are overweight or have a high risk for diabetes. What should I know about preventing infection? Hepatitis B If you have a higher risk for hepatitis B, you should be screened for this virus. Talk with your health care provider to find out if you are at risk for hepatitis B infection. Hepatitis C Testing is recommended for: Everyone born from [birth year] through 1963. Anyone with known risk factors  for hepatitis C. Sexually transmitted infections (STIs) Get screened for STIs, including gonorrhea and chlamydia, if: You are sexually active and are younger than 75 years of age. You are older than 75 years of age and your health care provider tells you that you are at risk for this type of infection. Your sexual activity has changed since you were last screened, and you are at increased risk for chlamydia or gonorrhea. Ask your health care provider if you are at risk. Ask your health care provider about whether you are at high risk for HIV. Your health care provider may recommend a prescription medicine to help prevent HIV infection. If you choose to take medicine to prevent HIV, you should first get tested for HIV. You should then be tested every 3 months for as long as you are taking the medicine. Pregnancy If you are about to stop having your period (premenopausal) and you may become pregnant, seek counseling before you get pregnant. Take 400 to 800 micrograms (mcg) of folic acid every day if you become pregnant. Ask for birth control (contraception) if you want to prevent pregnancy. Osteoporosis and menopause Osteoporosis is a disease in which the bones lose minerals and strength with aging. This can result in bone fractures. If you are 64 years old or older, or if you are at risk for osteoporosis and fractures, ask your health care provider if you should: Be screened for bone loss. Take a calcium or vitamin D supplement to lower your risk of fractures. Be given hormone replacement therapy (HRT) to treat symptoms of menopause. Follow these instructions at home: Alcohol use Do not drink alcohol if: Your health care provider tells you not to drink. You are pregnant, may be pregnant, or are planning to become pregnant. If you drink alcohol: Limit how much you have to: 0-1 drink a day. Know how much alcohol is in your drink. In the [place] one drink equals one 12 oz bottle of beer (355 mL),  one 5 oz glass of wine (148 mL), or one 1 oz glass of hard liquor (44 mL). Lifestyle Do not use any products that contain nicotine or tobacco. These products include cigarettes, chewing tobacco, and vaping devices, such as e-cigarettes. If you need help quitting, ask your health care provider. Do not use street drugs. Do not share needles. Ask your health care provider for help if you need support or information about quitting drugs. General instructions Schedule regular health, dental, and eye exams. Stay current with your vaccines. Tell your health care provider if: You often feel depressed. You have ever been abused or do not feel safe at home. Summary Adopting a healthy lifestyle and getting preventive care are important in promoting health and wellness. Follow your health care provider's instructions about healthy diet, exercising, and getting tested or screened for diseases. Follow your health care provider's instructions on monitoring your cholesterol and blood pressure. This information is not intended to replace advice given  to you by your health care provider. Make sure you discuss any questions you have with your health care provider. Document Revised: 08/04/2020 Document Reviewed: 08/04/2020 Elsevier Patient Education  2024 [employer].

## 2023-01-10 ENCOUNTER — Other Ambulatory Visit: Payer: Self-pay | Admitting: Internal Medicine

## 2023-01-10 ENCOUNTER — Other Ambulatory Visit: Payer: Self-pay

## 2023-01-11 ENCOUNTER — Ambulatory Visit (INDEPENDENT_AMBULATORY_CARE_PROVIDER_SITE_OTHER): Payer: Medicare HMO | Admitting: Radiology

## 2023-01-11 DIAGNOSIS — Z23 Encounter for immunization: Secondary | ICD-10-CM | POA: Diagnosis not present

## 2023-01-11 NOTE — Progress Notes (Addendum)
Patient here for HD flu shot. Patient tolerated well with no complications.

## 2023-01-20 ENCOUNTER — Encounter: Payer: Self-pay | Admitting: Nurse Practitioner

## 2023-01-20 ENCOUNTER — Other Ambulatory Visit: Payer: Self-pay | Admitting: Internal Medicine

## 2023-02-11 ENCOUNTER — Telehealth (HOSPITAL_BASED_OUTPATIENT_CLINIC_OR_DEPARTMENT_OTHER): Payer: Self-pay | Admitting: Cardiology

## 2023-02-11 MED ORDER — APIXABAN 5 MG PO TABS
5.0000 mg | ORAL_TABLET | Freq: Two times a day (BID) | ORAL | Status: DC
Start: 2023-02-11 — End: 2023-10-05

## 2023-02-11 NOTE — Telephone Encounter (Signed)
Patient returned RN's call. 

## 2023-02-11 NOTE — Telephone Encounter (Signed)
Sample request for Eliquis received. Indication: Afib  Last office visit:07/28/22 Cristal Deer)  Scr: 1.00 (07/12/22)  Age: 77 Weight: 77.1kg  Eliquis 5mg  BID is appropriate dose.

## 2023-02-11 NOTE — Telephone Encounter (Signed)
Patient calling the office for samples of medication:   1.  What medication and dosage are you requesting samples for? Eliquis  2.  Are you currently out of this medication? Need enough until the first of the year- it is so expensive

## 2023-02-11 NOTE — Telephone Encounter (Signed)
Ok for Eliquis 5 mg samples?

## 2023-02-11 NOTE — Telephone Encounter (Signed)
Samples of Eliquis 5 mg #2 left at front for pick up  Unable to provide for remainder of year  Left message to call back and sent mychart message

## 2023-02-14 NOTE — Telephone Encounter (Signed)
Left detailed message, samples ready for pick up

## 2023-02-14 NOTE — Progress Notes (Unsigned)
02/14/2023 Leah Mata [id number] 1945/07/19   CHIEF COMPLAINT:   HISTORY OF PRESENT ILLNESS: Leah Mata is a 77 year old female with a past medical history of hypertension, COPD, pre-diabetes and colon polyps. She presents today to schedule a colonoscopy.      Latest Ref Rng & Units 07/12/2022    4:19 PM 01/27/2022    4:31 AM 01/26/2022    4:46 AM  CBC  WBC 4.0 - 10.5 K/uL 14.3  8.0  10.1   Hemoglobin 12.0 - 15.0 g/dL 54.2  70.6  23.7   Hematocrit 36.0 - 46.0 % 39.4  32.8  35.0   Platelets 150.0 - 400.0 K/uL 243.0  182  201        Latest Ref Rng & Units 07/12/2022    4:19 PM 01/27/2022    4:31 AM 01/26/2022    6:25 AM  CMP  Glucose 70 - 99 mg/dL 628  315  176   BUN 6 - 23 mg/dL 17  20  26    Creatinine 0.40 - 1.20 mg/dL 1.60  7.37  1.06   Sodium 135 - 145 mEq/L 135  135  135   Potassium 3.5 - 5.1 mEq/L 4.1  4.4  4.9   Chloride 96 - 112 mEq/L 97  104  104   CO2 19 - 32 mEq/L 26  24  27    Calcium 8.4 - 10.5 mg/dL 26.9  9.3  9.6   Total Protein 6.0 - 8.3 g/dL 8.1     Total Bilirubin 0.2 - 1.2 mg/dL 0.7     Alkaline Phos 39 - 117 U/L 88     AST 0 - 37 U/L 9     ALT 0 - 35 U/L 8        ECHO 10/27/2021: IMPRESSIONS Left ventricular ejection fraction, by estimation, is 60 to 65%. The left ventricle has normal function. The left ventricle has no regional wall motion abnormalities. Left ventricular diastolic parameters are indeterminate. 1. Right ventricular systolic function is normal. The right ventricular size is normal. Tricuspid regurgitation signal is inadequate for assessing PA pressure. 2. 3. There is no evidence of cardiac tamponade. The mitral valve is normal in structure. No evidence of mitral valve regurgitation. No evidence of mitral stenosis. 4. The aortic valve was not well visualized. Aortic valve regurgitation is not visualized. No aortic stenosis is present. 5. The inferior vena cava is normal in size with greater than 50% respiratory variability, suggesting  right atrial pressure of 3 mmHg  Colonoscopy 07/07/2012 by Dr. Juanda Chance: Normal colonoscopy  Internal hemorrhoids  10 year recall colonoscopy   Colonoscopy 04/26/2007:    1. COLON AT 70 CM, POLYP(S): ADENOMATOUS POLYP(S). NO HIGH   GRADE DYSPLASIA OR INVASIVE MALIGNANCY IDENTIFIED.    2. COLON AT 20 CM, POLYP: HYPERPLASTIC POLYP(S). NO   ADENOMATOUS CHANGE OR MALIGNANCY IDENTIFIED.   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   Procedure: ANEURYSM ABDOMINAL AORTIC REPAIR WITH HEMASHIELD GOLD VASCULAR GRAFT 22X11 mm;  Surgeon: Larina Earthly, MD;  Location: [facility];  Service: Vascular;  Laterality: N/A;   CARDIAC CATHETERIZATION  03/29/2001   negative   DILATION AND CURETTAGE OF UTERUS  1985   TONSILLECTOMY Bilateral    age 52   TUBAL LIGATION  1985   XI ROBOTIC ASSISTED VENTRAL HERNIA N/A 01/22/2022   Procedure: XI ROBOTIC ASSISTED INCISIONAL HERNIA REPAIR WITH MESH;  Surgeon: Stechschulte, Hyman Hopes, MD;  Location: [facility];  Service: General;  Laterality: N/A;   Social History:  Family History:    reports that she quit smoking about 4 years ago. Her smoking use included cigarettes. She started smoking about 34 years ago. She has a 15 pack-year smoking history. She has never used smokeless tobacco. She reports that she does not drink alcohol and does not use drugs. family history includes Congestive Heart Failure in her mother; Coronary artery disease in an other family member; Diabetes in her father and mother; Heart attack in her father; Hypertension in her mother.  Allergies  Allergen Reactions   Hydrochlorothiazide Other (See Comments)    unknown      Outpatient Encounter Medications as of  02/15/2023  Medication Sig   acetaminophen (TYLENOL) 500 MG tablet Take 1,000 mg by mouth every 6 (six) hours as needed for mild pain.   albuterol (PROVENTIL) (2.5 MG/3ML) 0.083% nebulizer solution Take 3 mLs (2.5 mg total) by nebulization every 6 (six) hours as needed for wheezing or shortness of breath.   ALPRAZolam (XANAX) 0.25 MG tablet TAKE 1 TABLET BY MOUTH TWICE A DAY AS NEEDED FOR ANXIETY   amLODipine (NORVASC) 10 MG tablet TAKE 1 TABLET EVERY DAY   apixaban (ELIQUIS) 5 MG TABS tablet Take 1 tablet (5 mg total) by mouth 2 (two) times daily.   apixaban (ELIQUIS) 5 MG TABS tablet Take 1 tablet (5 mg total) by mouth 2 (two) times daily for 14 days.   Blood Glucose Monitoring Suppl (TRUE METRIX AIR GLUCOSE METER) w/Device KIT Use as directed four times per day (Patient not taking: Reported on 01/07/2023)   Blood Glucose Monitoring Suppl (TRUE METRIX METER) DEVI Use as directed once daily E11.9 (Patient not taking: Reported on 01/07/2023)   cholecalciferol (VITAMIN D3) 25 MCG (1000 UNIT) tablet Take 1,000 Units by mouth daily.   citalopram (CELEXA) 10 MG tablet TAKE 1 TABLET EVERY DAY   Fluticasone-Umeclidin-Vilant (TRELEGY ELLIPTA) 100-62.5-25 MCG/ACT AEPB Inhale 1 puff into the lungs daily. (Patient not taking: Reported on 01/07/2023)   glucose blood (TRUE METRIX BLOOD GLUCOSE TEST) test strip Use as instructed once daily E11.9 (Patient not taking: Reported on 01/07/2023)   irbesartan (AVAPRO) 300 MG tablet Take 1 tablet (300 mg total) by mouth daily.   metoprolol tartrate (LOPRESSOR) 50 MG tablet TAKE 1 TABLET TWICE DAILY   pravastatin (PRAVACHOL) 20 MG tablet TAKE 1 TABLET EVERY DAY   TRUEplus Lancets 33G MISC TEST BLOOD SUGAR ONE TIME DAILY AS DIRECTED (Patient not taking: Reported on 01/07/2023)   No facility-administered encounter medications on file as of 02/15/2023.     REVIEW OF SYSTEMS:  Gen: Denies fever, sweats or chills. No weight loss.  CV: Denies chest pain, palpitations  or edema. Resp: Denies cough, shortness of breath of hemoptysis.  GI: Denies heartburn, dysphagia, stomach or lower abdominal pain. No diarrhea or constipation.  GU: Denies urinary burning, blood in urine, increased urinary frequency or incontinence. MS: Denies joint pain, muscles aches or weakness. Derm: Denies rash, itchiness, skin lesions or unhealing ulcers. Psych: Denies depression, anxiety, memory loss or confusion. Heme: Denies bruising, easy bleeding. Neuro:  Denies headaches, dizziness or paresthesias. Endo:  Denies any problems with DM, thyroid or adrenal function.  PHYSICAL EXAM: LMP  03/29/1993 (Approximate)  General: in no acute distress. Head: Normocephalic and atraumatic. Eyes:  Sclerae non-icteric, conjunctive pink. Ears: Normal auditory acuity. Mouth: Dentition intact. No ulcers or lesions.  Neck: Supple, no lymphadenopathy or thyromegaly.  Lungs: Clear bilaterally to auscultation without wheezes, crackles or rhonchi. Heart: Regular rate and rhythm. No murmur, rub or gallop appreciated.  Abdomen: Soft, nontender, nondistended. No masses. No hepatosplenomegaly. Normoactive bowel sounds x 4 quadrants.  Rectal: Deferred.  Musculoskeletal: Symmetrical with no gross deformities. Skin: Warm and dry. No rash or lesions on visible extremities. Extremities: No edema. Neurological: Alert oriented x 4, no focal deficits.  Psychological:  Alert and cooperative. Normal mood and affect.  ASSESSMENT AND PLAN:    CC:  Corwin Levins, MD

## 2023-02-15 ENCOUNTER — Ambulatory Visit: Payer: Medicare HMO | Admitting: Nurse Practitioner

## 2023-02-15 ENCOUNTER — Encounter: Payer: Self-pay | Admitting: Nurse Practitioner

## 2023-02-15 VITALS — BP 138/76 | HR 56 | Ht 66.0 in | Wt 188.8 lb

## 2023-02-15 DIAGNOSIS — K635 Polyp of colon: Secondary | ICD-10-CM

## 2023-02-15 DIAGNOSIS — K5900 Constipation, unspecified: Secondary | ICD-10-CM

## 2023-02-15 NOTE — Patient Instructions (Addendum)
Miralax- take every night as needed  Our office will contact you regarding scheduling a future colonoscopy.  Due to recent changes in healthcare laws, you may see the results of your imaging and laboratory studies on MyChart before your provider has had a chance to review them.  We understand that in some cases there may be results that are confusing or concerning to you. Not all laboratory results come back in the same time frame and the provider may be waiting for multiple results in order to interpret others.  Please give [place] 48 hours in order for your provider to thoroughly review all the results before contacting the office for clarification of your results.   Thank you for trusting me with your gastrointestinal care!   Alcide Evener, CRNP

## 2023-02-15 NOTE — Progress Notes (Signed)
I agree with the assessment and plan as outlined by Ms. Riley Kill. Okay to schedule for a colonoscopy for polyp surveillance if patient is agreeable. Will need Eliquis held for 2 days prior to procedure. Would consider 2-day prep considering history of constipation.

## 2023-02-16 NOTE — Progress Notes (Signed)
In addition to the message below, if patient is willing to schedule a colonoscopy, Dr. Leonides Schanz wants her to have a 2 day bowel prep. THX.

## 2023-02-16 NOTE — Progress Notes (Signed)
Leah Mata, pls contact patient and let her know Dr. Leonides Schanz reviewed her office visit and she recommended for the patient to undergo a colonoscopy, ok to schedule in [facility] if patient is willing to do so. Pls contact the provider who prescribes her Eliquis, will need Eliquis hold instructions prior to proceeding with a colonoscopy.   Pls note, patient has a difficult time hearing on the telephone so she requested anyone who calls her to speak a little louder and not too quickly. THX.

## 2023-02-17 ENCOUNTER — Ambulatory Visit
Admission: RE | Admit: 2023-02-17 | Discharge: 2023-02-17 | Disposition: A | Discharge status: Home / Self Care | Payer: Medicare HMO | Source: Ambulatory Visit | Attending: Internal Medicine | Admitting: Internal Medicine

## 2023-02-17 ENCOUNTER — Telehealth: Payer: Self-pay

## 2023-02-17 DIAGNOSIS — Z1231 Encounter for screening mammogram for malignant neoplasm of breast: Secondary | ICD-10-CM

## 2023-02-17 NOTE — Telephone Encounter (Signed)
Called patient & discussed NP/MD recommendations. She states she would prefer to hold off on scheduling a colonoscopy. She will call back & schedule if she changes her mind. Advised her that if she had any further questions about scheduling or the procedure itself then to give [place] a call back. Pt verbalized all understanding.

## 2023-02-17 NOTE — Telephone Encounter (Signed)
Author: Arnaldo Natal, NP Service: Gastroenterology Author Type: Nurse Practitioner  Filed: 02/16/2023  1:41 PM Encounter Date: 02/15/2023 Status: Signed  Editor: Arnaldo Natal, NP (Nurse Practitioner)   Viviann Spare, pls contact patient and let her know Dr. Leonides Schanz reviewed her office visit and she recommended for the patient to undergo a colonoscopy, ok to schedule in [facility] if patient is willing to do so. Pls contact the provider who prescribes her Eliquis, will need Eliquis hold instructions prior to proceeding with a colonoscopy.    Pls note, patient has a difficult time hearing on the telephone so she requested anyone who calls her to speak a little louder and not too quickly. THX.      Author: Arnaldo Natal, NP Service: Gastroenterology Author Type: Nurse Practitioner  Filed: 02/16/2023  1:42 PM Encounter Date: 02/15/2023 Status: Signed  Editor: Arnaldo Natal, NP (Nurse Practitioner)   In addition to the message below, if patient is willing to schedule a colonoscopy, Dr. Leonides Schanz wants her to have a 2 day bowel prep. THX.

## 2023-03-09 DIAGNOSIS — H906 Mixed conductive and sensorineural hearing loss, bilateral: Secondary | ICD-10-CM | POA: Diagnosis not present

## 2023-03-11 ENCOUNTER — Ambulatory Visit (INDEPENDENT_AMBULATORY_CARE_PROVIDER_SITE_OTHER): Payer: Medicare HMO | Admitting: Family Medicine

## 2023-03-11 ENCOUNTER — Encounter: Payer: Self-pay | Admitting: Family Medicine

## 2023-03-11 VITALS — BP 122/66 | HR 62 | Temp 97.4°F | Ht 66.0 in | Wt 187.6 lb

## 2023-03-11 DIAGNOSIS — J441 Chronic obstructive pulmonary disease with (acute) exacerbation: Secondary | ICD-10-CM | POA: Diagnosis not present

## 2023-03-11 DIAGNOSIS — R051 Acute cough: Secondary | ICD-10-CM

## 2023-03-11 LAB — POCT INFLUENZA A/B
Influenza A, POC: NEGATIVE
Influenza B, POC: NEGATIVE

## 2023-03-11 LAB — POC COVID19 BINAXNOW: SARS Coronavirus 2 Ag: NEGATIVE

## 2023-03-11 MED ORDER — METHYLPREDNISOLONE ACETATE 80 MG/ML IJ SUSP
80.0000 mg | Freq: Once | INTRAMUSCULAR | Status: AC
Start: 2023-03-11 — End: 2023-03-11
  Administered 2023-03-11: 80 mg via INTRAMUSCULAR

## 2023-03-11 MED ORDER — PREDNISONE 20 MG PO TABS
40.0000 mg | ORAL_TABLET | Freq: Every day | ORAL | 0 refills | Status: AC
Start: 2023-03-11 — End: 2023-03-16

## 2023-03-11 MED ORDER — DOXYCYCLINE HYCLATE 100 MG PO CAPS
100.0000 mg | ORAL_CAPSULE | Freq: Two times a day (BID) | ORAL | 0 refills | Status: DC
Start: 2023-03-11 — End: 2023-10-05

## 2023-03-11 NOTE — Progress Notes (Signed)
Acute Office Visit  Subjective:     Patient ID: Leah Mata, female    DOB: 03-Nov-1945, 77 y.o.   MRN: [id number]  Chief Complaint  Patient presents with   Sore Throat    Cold like symptoms since Monday. PT noted that the had been around someone that also had cold like symptoms on Sunday. Currently treating with tylenol   Shortness of Breath   Headache   Fever   Cerumen Impaction    Right ear impacted    HPI Patient is in today for evaluation of cough and cold symptoms, for the last 5 days. Also reports chills, SOB, dark green thick sputum with the cough.  She has hx COPD. Has not attempted to treat at home.   Reports being around people with a cold at church. Has not taken a COVID test for this illness. Denies abdominal pain, nausea, vomiting, diarrhea, rash, fever, other symptoms.  Medical hx as outlined below.  ROS Per HPI      Objective:    BP 122/66 (BP Location: Left Arm, Patient Position: Sitting, Cuff Size: Normal)   Pulse 62   Temp (!) 97.4 F (36.3 C) (Oral)   Ht 5\' 6"  (1.676 m)   Wt 187 lb 9.6 oz (85.1 kg)   LMP 03/29/1993 (Approximate)   SpO2 96%   BMI 30.28 kg/m    Physical Exam Vitals and nursing note reviewed.  Constitutional:      General: She is not in acute distress.    Appearance: She is ill-appearing.  HENT:     Head: Normocephalic and atraumatic.     Right Ear: Tympanic membrane and ear canal normal.     Left Ear: Tympanic membrane and ear canal normal.     Nose: No congestion.     Mouth/Throat:     Mouth: Mucous membranes are moist.     Pharynx: Oropharynx is clear. No oropharyngeal exudate or posterior oropharyngeal erythema.     Comments: Oropharyngeal cobblestoning Eyes:     Extraocular Movements: Extraocular movements intact.  Cardiovascular:     Rate and Rhythm: Normal rate and regular rhythm.     Heart sounds: Normal heart sounds.  Pulmonary:     Effort: Pulmonary effort is normal. No respiratory distress.     Breath  sounds: Examination of the right-lower field reveals decreased breath sounds. Examination of the left-lower field reveals decreased breath sounds. Decreased breath sounds and rhonchi present. No wheezing or rales.     Comments: Moderate productive cough in office Musculoskeletal:     Cervical back: Normal range of motion and neck supple.  Lymphadenopathy:     Cervical: No cervical adenopathy.  Skin:    General: Skin is warm and dry.  Neurological:     Mental Status: She is alert.    Results for orders placed or performed in visit on 03/11/23  POC COVID-19 BinaxNow  Result Value Ref Range   SARS Coronavirus 2 Ag Negative Negative  POCT Influenza A/B  Result Value Ref Range   Influenza A, POC Negative Negative   Influenza B, POC Negative Negative        Assessment & Plan:  1. COPD exacerbation (HCC) (Primary)  - methylPREDNISolone acetate (DEPO-MEDROL) injection 80 mg - doxycycline (VIBRAMYCIN) 100 MG capsule; Take 1 capsule (100 mg total) by mouth 2 (two) times daily.  Dispense: 14 capsule; Refill: 0 - predniSONE (DELTASONE) 20 MG tablet; Take 2 tablets (40 mg total) by mouth daily for 5 days.  Dispense: 10 tablet; Refill: 0  2. Acute cough  - POC COVID-19 BinaxNow - POCT Influenza A/B   Meds ordered this encounter  Medications   methylPREDNISolone acetate (DEPO-MEDROL) injection 80 mg   doxycycline (VIBRAMYCIN) 100 MG capsule    Sig: Take 1 capsule (100 mg total) by mouth 2 (two) times daily.    Dispense:  14 capsule    Refill:  0   predniSONE (DELTASONE) 20 MG tablet    Sig: Take 2 tablets (40 mg total) by mouth daily for 5 days.    Dispense:  10 tablet    Refill:  0    Return if symptoms worsen or fail to improve.  Moshe Cipro, FNP

## 2023-03-11 NOTE — Patient Instructions (Addendum)
COVID test is negative.   Flu test is negative.  I have sent in a prescription for doxycycline for you to take twice a day for 7 days.  Take this medication with food as it can upset your stomach if you do not.  You have received a steroid injection in the office today.   I have sent in prednisone for you to take 2 tablets once daily in the morning with breakfast for the next 5 days.  Follow-up with me for new or worsening symptoms.

## 2023-04-13 ENCOUNTER — Other Ambulatory Visit (HOSPITAL_COMMUNITY): Payer: Self-pay

## 2023-04-13 ENCOUNTER — Telehealth: Payer: Self-pay | Admitting: Cardiology

## 2023-04-13 MED ORDER — APIXABAN 5 MG PO TABS
5.0000 mg | ORAL_TABLET | Freq: Two times a day (BID) | ORAL | 1 refills | Status: DC
  Filled 2023-04-13 (×2): qty 180, 90d supply, fill #0
  Filled 2023-07-14: qty 180, 90d supply, fill #1

## 2023-04-13 NOTE — Telephone Encounter (Signed)
*  STAT* If patient is at the pharmacy, call can be transferred to refill team.   1. Which medications need to be refilled? (please list name of each medication and dose if known) apixaban (ELIQUIS) 5 MG TABS tablet   2. Which pharmacy/location (including street and city if local pharmacy) is medication to be sent to? [place] [facility]   3. Do they need a 30 day or 90 day supply? 90  

## 2023-04-13 NOTE — Telephone Encounter (Signed)
 Eliquis  5 mg bid was sent to pts pharmacy. Pt was notified that RX was sent to her pharmacy.

## 2023-04-14 ENCOUNTER — Other Ambulatory Visit: Payer: Self-pay

## 2023-04-14 ENCOUNTER — Other Ambulatory Visit (HOSPITAL_COMMUNITY): Payer: Self-pay

## 2023-04-14 MED ORDER — APIXABAN 5 MG PO TABS
5.0000 mg | ORAL_TABLET | Freq: Two times a day (BID) | ORAL | 3 refills | Status: DC
  Filled 2023-04-14 – 2023-10-12 (×2): qty 180, 90d supply, fill #0

## 2023-04-18 ENCOUNTER — Other Ambulatory Visit (HOSPITAL_COMMUNITY): Payer: Self-pay

## 2023-04-18 MED ORDER — IRBESARTAN 300 MG PO TABS
300.0000 mg | ORAL_TABLET | Freq: Every day | ORAL | 3 refills | Status: DC
  Filled 2023-04-18: qty 90, 90d supply, fill #0

## 2023-04-18 MED ORDER — PRAVASTATIN SODIUM 20 MG PO TABS
20.0000 mg | ORAL_TABLET | Freq: Every day | ORAL | 3 refills | Status: DC
  Filled 2023-04-18: qty 90, 90d supply, fill #0
  Filled 2023-11-08: qty 90, 90d supply, fill #1

## 2023-04-18 MED ORDER — CITALOPRAM HYDROBROMIDE 10 MG PO TABS
10.0000 mg | ORAL_TABLET | Freq: Every day | ORAL | 2 refills | Status: DC
  Filled 2023-04-18: qty 90, 90d supply, fill #0

## 2023-05-17 ENCOUNTER — Encounter: Payer: Self-pay | Admitting: *Deleted

## 2023-06-21 ENCOUNTER — Ambulatory Visit (INDEPENDENT_AMBULATORY_CARE_PROVIDER_SITE_OTHER): Admitting: Internal Medicine

## 2023-06-21 ENCOUNTER — Ambulatory Visit: Payer: Self-pay | Admitting: *Deleted

## 2023-06-21 ENCOUNTER — Telehealth: Payer: Self-pay | Admitting: Internal Medicine

## 2023-06-21 ENCOUNTER — Ambulatory Visit (INDEPENDENT_AMBULATORY_CARE_PROVIDER_SITE_OTHER)

## 2023-06-21 ENCOUNTER — Encounter: Payer: Self-pay | Admitting: Internal Medicine

## 2023-06-21 VITALS — BP 136/62 | HR 93 | Temp 100.9°F | Ht 66.0 in | Wt 188.0 lb

## 2023-06-21 DIAGNOSIS — R7303 Prediabetes: Secondary | ICD-10-CM | POA: Diagnosis not present

## 2023-06-21 DIAGNOSIS — U071 COVID-19: Secondary | ICD-10-CM | POA: Diagnosis not present

## 2023-06-21 DIAGNOSIS — R062 Wheezing: Secondary | ICD-10-CM | POA: Insufficient documentation

## 2023-06-21 DIAGNOSIS — R059 Cough, unspecified: Secondary | ICD-10-CM | POA: Diagnosis not present

## 2023-06-21 DIAGNOSIS — R509 Fever, unspecified: Secondary | ICD-10-CM | POA: Diagnosis not present

## 2023-06-21 LAB — POC COVID19 BINAXNOW: SARS Coronavirus 2 Ag: POSITIVE — AB

## 2023-06-21 LAB — POCT INFLUENZA A/B
Influenza A, POC: NEGATIVE
Influenza B, POC: NEGATIVE

## 2023-06-21 LAB — POCT RESPIRATORY SYNCYTIAL VIRUS: RSV Rapid Ag: NEGATIVE

## 2023-06-21 MED ORDER — ALBUTEROL SULFATE HFA 108 (90 BASE) MCG/ACT IN AERS: 2.0000 | INHALATION_SPRAY | Freq: Four times a day (QID) | RESPIRATORY_TRACT | 0 refills | Status: DC | PRN

## 2023-06-21 MED ORDER — NIRMATRELVIR/RITONAVIR (PAXLOVID) TABLET (RENAL DOSING): 2.0000 | ORAL_TABLET | Freq: Two times a day (BID) | ORAL | 0 refills | Status: AC

## 2023-06-21 MED ORDER — PREDNISONE 10 MG PO TABS: ORAL_TABLET | ORAL | 0 refills | Status: DC

## 2023-06-21 MED ORDER — METHYLPREDNISOLONE ACETATE 80 MG/ML IJ SUSP
80.0000 mg | Freq: Once | INTRAMUSCULAR | Status: AC
  Administered 2023-06-21: 80 mg via INTRAMUSCULAR

## 2023-06-21 NOTE — Progress Notes (Signed)
 Patient ID: Leah Mata, female   DOB: Feb 16, 1946, 78 y.o.   MRN: [id number]        Chief Complaint: follow up cough and wheezing found Covid +       HPI:  Leah Mata is a 78 y.o. female here with above, symptoms x 2 days with Here with acute onset mild to mod 2-3 days ST, HA, general weakness and malaise, with prod cough grey sputum, with sob doe wheezing,  but Pt denies chest pain, orthopnea, PND, increased LE swelling, palpitations, dizziness or syncope.   Pt denies polydipsia, polyuria, or new focal neuro s/s.    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   Procedure: ANEURYSM ABDOMINAL AORTIC REPAIR WITH HEMASHIELD GOLD VASCULAR GRAFT 22X11 mm;  Surgeon: Larina Earthly, MD;  Location: [facility] OR;  Service: Vascular;  Laterality: N/A;   CARDIAC CATHETERIZATION  03/29/2001   negative   DILATION AND CURETTAGE OF UTERUS  1985   TONSILLECTOMY Bilateral    age 18   TUBAL LIGATION  1985   XI ROBOTIC ASSISTED VENTRAL HERNIA N/A 01/22/2022   Procedure: XI ROBOTIC ASSISTED INCISIONAL HERNIA REPAIR WITH MESH;  Surgeon: Quentin Ore, MD;  Location: [facility];  Service: General;  Laterality: N/A;    reports that she quit smoking about 5 years ago. Her smoking use included cigarettes. She started smoking about 35 years ago. She has a 15 pack-year smoking history. She has never used  smokeless tobacco. She reports that she does not drink alcohol and does not use drugs. family history includes Congestive Heart Failure in her mother; Coronary artery disease in an other family member; Diabetes in her father and mother; Heart attack in her father; Hypertension in her mother. Allergies  Allergen Reactions   Hydrochlorothiazide Other (See Comments)    unknown   Current Outpatient Medications on File Prior to Visit  Medication Sig Dispense Refill   acetaminophen (TYLENOL) 500 MG tablet Take 1,000 mg by mouth every 6 (six) hours as needed for mild pain.     albuterol (PROVENTIL) (2.5 MG/3ML) 0.083% nebulizer solution Take 3 mLs (2.5 mg total) by nebulization every 6 (six) hours as needed for wheezing or shortness of breath. 150 mL 3   ALPRAZolam (XANAX) 0.25 MG tablet Take 0.25 mg by mouth 2 (two) times daily as needed.     amLODipine (NORVASC) 10 MG tablet TAKE 1 TABLET EVERY DAY 90 tablet 2   apixaban (ELIQUIS) 5 MG TABS tablet Take 1 tablet (5 mg total) by mouth 2 (two) times daily. 180 tablet 1   apixaban (ELIQUIS) 5 MG TABS tablet Take 1 tablet (5 mg total) by mouth 2 (two) times daily. 180 tablet 3   Blood Glucose Monitoring Suppl (TRUE METRIX AIR GLUCOSE METER)  w/Device KIT Use as directed four times per day 1 kit 3   Blood Glucose Monitoring Suppl (TRUE METRIX METER) DEVI Use as directed once daily E11.9 1 each 0   cholecalciferol (VITAMIN D3) 25 MCG (1000 UNIT) tablet Take 1,000 Units by mouth daily.     citalopram (CELEXA) 10 MG tablet TAKE 1 TABLET EVERY DAY 90 tablet 2   citalopram (CELEXA) 10 MG tablet Take 1 tablet (10 mg total) by mouth daily. 90 tablet 2   doxycycline (VIBRAMYCIN) 100 MG capsule Take 1 capsule (100 mg total) by mouth 2 (two) times daily. 14 capsule 0   irbesartan (AVAPRO) 300 MG tablet Take 1 tablet (300 mg total) by mouth daily. 90 tablet 3   metoprolol tartrate (LOPRESSOR) 50 MG tablet TAKE 1 TABLET TWICE DAILY 180 tablet 2   pravastatin  (PRAVACHOL) 20 MG tablet TAKE 1 TABLET EVERY DAY 90 tablet 3   pravastatin (PRAVACHOL) 20 MG tablet Take 1 tablet (20 mg total) by mouth daily. 90 tablet 3   apixaban (ELIQUIS) 5 MG TABS tablet Take 1 tablet (5 mg total) by mouth 2 (two) times daily for 14 days.     No current facility-administered medications on file prior to visit.        ROS:  All others reviewed and negative.  Objective        PE:  BP 136/62 (BP Location: Left Arm, Patient Position: Sitting, Cuff Size: Normal)   Pulse 93   Temp (!) 100.9 F (38.3 C) (Oral)   Ht 5\' 6"  (1.676 m)   Wt 188 lb (85.3 kg)   LMP 03/29/1993 (Approximate)   SpO2 96%   BMI 30.34 kg/m                 Constitutional: Pt appears mild ill               HENT: Head: NCAT.                Right Ear: External ear normal.                 Left Ear: External ear normal.                Eyes: . Pupils are equal, round, and reactive to light. Conjunctivae and EOM are normal               Nose: without d/c or deformity               Neck: Neck supple. Gross normal ROM               Cardiovascular: Normal rate and regular rhythm.                 Pulmonary/Chest: Effort normal and breath sounds decreased without rales and course BS with few wheezing.                               Neurological: Pt is alert. At baseline orientation, motor grossly intact               Skin: Skin is warm. No rashes, no other new lesions, LE edema - none               Psychiatric: Pt behavior is normal without agitation   Micro: none  Cardiac tracings I have personally interpreted today:  none  Pertinent Radiological findings (summarize): none   Lab  Results  Component Value Date   WBC 14.3 (H) 07/12/2022   HGB 13.0 07/12/2022   HCT 39.4 07/12/2022   PLT 243.0 07/12/2022   GLUCOSE 113 (H) 07/12/2022   CHOL 143 07/12/2022   TRIG 134.0 07/12/2022   HDL 50.20 07/12/2022   LDLDIRECT 79.0 04/20/2021   LDLCALC 66 07/12/2022   ALT 8 07/12/2022   AST 9 07/12/2022    NA 135 07/12/2022   K 4.1 07/12/2022   CL 97 07/12/2022   CREATININE 1.00 07/12/2022   BUN 17 07/12/2022   CO2 26 07/12/2022   TSH 1.38 07/12/2022   INR 1.16 04/30/2018   HGBA1C 6.6 (H) 07/12/2022  \ POCT - COVID - positive               Flu - neg, RSV - neg  Assessment/Plan:  Leah Mata is a 78 y.o. White or Caucasian [1] female with  has a past medical history of BACK PAIN (03/14/2009), COPD (11/01/2006), Dyspnea, HYPERTENSION (11/01/2006), Hypertrophy of tongue papillae (06/05/2009), Other and unspecified hyperlipidemia (08/22/2013), and Pre-diabetes.  COVID Mild to mod, for antibx course paxlovid renal dosed, cough med prn,  to f/u any worsening symptoms or concerns  Wheezing Mild to mod, for prednisone taper, depomedrol 80 mg IM,  to f/u any worsening symptoms or concerns, also or cxr  Pre-diabetes Lab Results  Component Value Date   HGBA1C 6.6 (H) 07/12/2022   Stable, pt to continue current medical treatment  - diet, wt control  Followup: No follow-ups on file.  Oliver Barre, MD 06/21/2023 8:11 PM [facility]

## 2023-06-21 NOTE — Assessment & Plan Note (Addendum)
 Mild to mod, for prednisone taper, depomedrol 80 mg IM,  to f/u any worsening symptoms or concerns, also or cxr

## 2023-06-21 NOTE — Assessment & Plan Note (Signed)
 Lab Results  Component Value Date   HGBA1C 6.6 (H) 07/12/2022   Stable, pt to continue current medical treatment  - diet, wt control

## 2023-06-21 NOTE — Telephone Encounter (Signed)
 Copied from CRM [id number]. Topic: Clinical - Red Word Triage >> Jun 21, 2023  8:47 AM Adele Barthel wrote: Red Word that prompted transfer to Nurse Triage:   Symptoms started Sunday Sore throat Congested Spitting up brown phlegm Shortness of breath Had to use nebulizer yesterday. Reason for Disposition  [1] MODERATE longstanding difficulty breathing (e.g., speaks in phrases, SOB even at rest, pulse 100-120) AND [2] SAME as normal  Answer Assessment - Initial Assessment Questions 1. RESPIRATORY STATUS: "Describe your breathing?" (e.g., wheezing, shortness of breath, unable to speak, severe coughing)      Shortness of breath, sore throat and congestion started on Sunday 2. ONSET: "When did this breathing problem begin?"      Sunday started Yesterday I had fever.   I'm coughing up brown mucus.    I'm using my nebulizer and it's making me cough this stuff up. 3. PATTERN "Does the difficult breathing come and go, or has it been constant since it started?"      Shortness of breath when I move around.   I have asthma and COPD 4. SEVERITY: "How bad is your breathing?" (e.g., mild, moderate, severe)    - MILD: No SOB at rest, mild SOB with walking, speaks normally in sentences, can lie down, no retractions, pulse < 100.    - MODERATE: SOB at rest, SOB with minimal exertion and prefers to sit, cannot lie down flat, speaks in phrases, mild retractions, audible wheezing, pulse 100-120.    - SEVERE: Very SOB at rest, speaks in single words, struggling to breathe, sitting hunched forward, retractions, pulse > 120      Moderate 5. RECURRENT SYMPTOM: "Have you had difficulty breathing before?" If Yes, ask: "When was the last time?" and "What happened that time?"      Yes 6. CARDIAC HISTORY: "Do you have any history of heart disease?" (e.g., heart attack, angina, bypass surgery, angioplasty)      Not asked 7. LUNG HISTORY: "Do you have any history of lung disease?"  (e.g., pulmonary embolus, asthma,  emphysema)     COPD and asthma 8. CAUSE: "What do you think is causing the breathing problem?"      A cold 9. OTHER SYMPTOMS: "Do you have any other symptoms? (e.g., dizziness, runny nose, cough, chest pain, fever)     Runny nose, congestion, sore throat. 10. O2 SATURATION MONITOR:  "Do you use an oxygen saturation monitor (pulse oximeter) at home?" If Yes, ask: "What is your reading (oxygen level) today?" "What is your usual oxygen saturation reading?" (e.g., 95%)       N/A 11. PREGNANCY: "Is there any chance you are pregnant?" "When was your last menstrual period?"       N/A 12. TRAVEL: "Have you traveled out of the country in the last month?" (e.g., travel history, exposures)       N/A  Protocols used: Breathing Difficulty-A-AH  Chief Complaint: Shortness of breath, coughing, sore throat, coughing up brown mucus.   Has COPD and asthma Symptoms: above Frequency: Started Sunday Pertinent Negatives: Patient denies fever Disposition: [] ED /[] Urgent Care (no appt availability in office) / [x] Appointment(In office/virtual)/ []  [facility]/ [] Home Care/ [] Refused Recommended Disposition /[] [facility]/ []  Follow-up with PCP Additional Notes: Appt made for today with Dr. Jonny Ruiz

## 2023-06-21 NOTE — Assessment & Plan Note (Signed)
Mild to mod, for antibx course paxlovid renal dosed, cough med prn,  to f/u any worsening symptoms or concerns

## 2023-06-21 NOTE — Telephone Encounter (Signed)
 Prescription Request  06/21/2023  LOV: 06/21/2023  What is the name of the medication or equipment? levalbuterol  Have you contacted your pharmacy to request a refill? Yes   Which pharmacy would you like this sent to?   [facility] [place],   Patient notified that their request is being sent to the clinical staff for review and that they should receive a response within 2 business days.   Please advise at [facility] [phone]

## 2023-06-21 NOTE — Patient Instructions (Signed)
 You had the steroid shot today  Please take all new medication as prescribed  - the antibiotic, inhaler and prednisone  You can also take Delsym OTC for cough, and/or Mucinex (or it's generic off brand) for congestion, and tylenol as needed for pain.  Please continue all other medications as before, and refills have been done if requested.  Please have the pharmacy call with any other refills you may need.  Please keep your appointments with your specialists as you may have planned  Please go to the XRAY Department in the first floor for the x-ray testing  You will be contacted by phone if any changes need to be made immediately.  Otherwise, you will receive a letter about your results with an explanation, but please check with MyChart first.

## 2023-06-22 MED ORDER — LEVALBUTEROL TARTRATE 45 MCG/ACT IN AERO
2.0000 | INHALATION_SPRAY | Freq: Four times a day (QID) | RESPIRATORY_TRACT | 12 refills | Status: DC
Start: 2023-06-22 — End: 2023-10-21

## 2023-06-22 NOTE — Telephone Encounter (Signed)
 Ok done erx

## 2023-06-22 NOTE — Telephone Encounter (Signed)
 Done erx

## 2023-06-22 NOTE — Telephone Encounter (Signed)
 Not on med list

## 2023-07-01 ENCOUNTER — Encounter: Payer: Self-pay | Admitting: Internal Medicine

## 2023-07-13 ENCOUNTER — Other Ambulatory Visit: Payer: Self-pay | Admitting: Internal Medicine

## 2023-07-13 ENCOUNTER — Other Ambulatory Visit: Payer: Self-pay

## 2023-07-14 ENCOUNTER — Other Ambulatory Visit: Payer: Self-pay | Admitting: Internal Medicine

## 2023-07-14 ENCOUNTER — Other Ambulatory Visit (HOSPITAL_COMMUNITY): Payer: Self-pay

## 2023-08-13 ENCOUNTER — Other Ambulatory Visit: Payer: Self-pay | Admitting: Internal Medicine

## 2023-09-22 ENCOUNTER — Telehealth: Payer: Self-pay | Admitting: Internal Medicine

## 2023-09-22 ENCOUNTER — Other Ambulatory Visit: Payer: Self-pay

## 2023-09-22 MED ORDER — ALBUTEROL SULFATE (2.5 MG/3ML) 0.083% IN NEBU: 2.5000 mg | INHALATION_SOLUTION | Freq: Four times a day (QID) | RESPIRATORY_TRACT | 3 refills | Status: DC | PRN

## 2023-09-22 NOTE — Telephone Encounter (Signed)
 Copied from CRM [id number]. Topic: Clinical - Medication Refill >> Sep 22, 2023  8:11 AM Rosina BIRCH wrote: Medication: albuterol  (PROVENTIL ) (2.5 MG/3ML) 0.083% nebulizer solution (patient stated she was having a hard time breathing because of the weather and I was going to get the patient over to be triaged but she refused and stated as long as she stay out of this hot weather)  Has the patient contacted their pharmacy? No (Agent: If no, request that the patient contact the pharmacy for the refill. If patient does not wish to contact the pharmacy document the reason why and proceed with request.) (Agent: If yes, when and what did the pharmacy advise?)  This is the patient's preferred pharmacy:  [id number] [place], [place] - [address] [place] [address] Phone: [phone] Fax: [phone]  Is this the correct pharmacy for this prescription? Yes If no, delete pharmacy and type the correct one.   Has the prescription been filled recently? No  Is the patient out of the medication? Yes  Has the patient been seen for an appointment in the last year OR does the patient have an upcoming appointment? Yes  Can we respond through MyChart? Yes  Agent: Please be advised that Rx refills may take up to 3 business days. We ask that you follow-up with your pharmacy.

## 2023-09-27 ENCOUNTER — Ambulatory Visit: Payer: Self-pay | Admitting: Internal Medicine

## 2023-09-27 ENCOUNTER — Encounter: Payer: Self-pay | Admitting: Internal Medicine

## 2023-09-27 ENCOUNTER — Ambulatory Visit (INDEPENDENT_AMBULATORY_CARE_PROVIDER_SITE_OTHER)

## 2023-09-27 ENCOUNTER — Ambulatory Visit (INDEPENDENT_AMBULATORY_CARE_PROVIDER_SITE_OTHER): Admitting: Internal Medicine

## 2023-09-27 VITALS — BP 110/64 | HR 59 | Temp 98.0°F | Ht 66.0 in | Wt 192.6 lb

## 2023-09-27 DIAGNOSIS — J441 Chronic obstructive pulmonary disease with (acute) exacerbation: Secondary | ICD-10-CM | POA: Diagnosis not present

## 2023-09-27 DIAGNOSIS — I10 Essential (primary) hypertension: Secondary | ICD-10-CM

## 2023-09-27 DIAGNOSIS — R7303 Prediabetes: Secondary | ICD-10-CM | POA: Diagnosis not present

## 2023-09-27 DIAGNOSIS — R0609 Other forms of dyspnea: Secondary | ICD-10-CM

## 2023-09-27 DIAGNOSIS — I700 Atherosclerosis of aorta: Secondary | ICD-10-CM | POA: Diagnosis not present

## 2023-09-27 DIAGNOSIS — R062 Wheezing: Secondary | ICD-10-CM | POA: Diagnosis not present

## 2023-09-27 DIAGNOSIS — R0602 Shortness of breath: Secondary | ICD-10-CM | POA: Diagnosis not present

## 2023-09-27 DIAGNOSIS — E538 Deficiency of other specified B group vitamins: Secondary | ICD-10-CM | POA: Diagnosis not present

## 2023-09-27 DIAGNOSIS — Z0001 Encounter for general adult medical examination with abnormal findings: Secondary | ICD-10-CM

## 2023-09-27 DIAGNOSIS — E559 Vitamin D deficiency, unspecified: Secondary | ICD-10-CM | POA: Diagnosis not present

## 2023-09-27 LAB — URINALYSIS, ROUTINE W REFLEX MICROSCOPIC
Bilirubin Urine: NEGATIVE
Hgb urine dipstick: NEGATIVE
Ketones, ur: NEGATIVE
Leukocytes,Ua: NEGATIVE
Nitrite: NEGATIVE
Specific Gravity, Urine: 1.015 (ref 1.000–1.030)
Total Protein, Urine: NEGATIVE
Urine Glucose: NEGATIVE
Urobilinogen, UA: 0.2 (ref 0.0–1.0)
pH: 6 (ref 5.0–8.0)

## 2023-09-27 LAB — CBC WITH DIFFERENTIAL/PLATELET
Basophils Absolute: 0.1 10*3/uL (ref 0.0–0.1)
Basophils Relative: 0.6 % (ref 0.0–3.0)
Eosinophils Absolute: 0.2 10*3/uL (ref 0.0–0.7)
Eosinophils Relative: 2.6 % (ref 0.0–5.0)
HCT: 38.1 % (ref 36.0–46.0)
Hemoglobin: 12.7 g/dL (ref 12.0–15.0)
Lymphocytes Relative: 16.6 % (ref 12.0–46.0)
Lymphs Abs: 1.5 10*3/uL (ref 0.7–4.0)
MCHC: 33.4 g/dL (ref 30.0–36.0)
MCV: 94.2 fl (ref 78.0–100.0)
Monocytes Absolute: 1 10*3/uL (ref 0.1–1.0)
Monocytes Relative: 11 % (ref 3.0–12.0)
Neutro Abs: 6.2 10*3/uL (ref 1.4–7.7)
Neutrophils Relative %: 69.2 % (ref 43.0–77.0)
Platelets: 238 10*3/uL (ref 150.0–400.0)
RBC: 4.05 Mil/uL (ref 3.87–5.11)
RDW: 14.1 % (ref 11.5–15.5)
WBC: 8.9 10*3/uL (ref 4.0–10.5)

## 2023-09-27 LAB — TSH: TSH: 1.24 u[IU]/mL (ref 0.35–5.50)

## 2023-09-27 LAB — HEPATIC FUNCTION PANEL
ALT: 24 U/L (ref 0–35)
AST: 25 U/L (ref 0–37)
Albumin: 4.4 g/dL (ref 3.5–5.2)
Alkaline Phosphatase: 75 U/L (ref 39–117)
Bilirubin, Direct: 0.1 mg/dL (ref 0.0–0.3)
Total Bilirubin: 0.7 mg/dL (ref 0.2–1.2)
Total Protein: 7.9 g/dL (ref 6.0–8.3)

## 2023-09-27 LAB — BASIC METABOLIC PANEL WITH GFR
BUN: 14 mg/dL (ref 6–23)
CO2: 31 meq/L (ref 19–32)
Calcium: 10.9 mg/dL — ABNORMAL HIGH (ref 8.4–10.5)
Chloride: 98 meq/L (ref 96–112)
Creatinine, Ser: 0.89 mg/dL (ref 0.40–1.20)
GFR: 62.95 mL/min (ref >60.00)
Glucose, Bld: 123 mg/dL — ABNORMAL HIGH (ref 70–99)
Potassium: 4.7 meq/L (ref 3.5–5.1)
Sodium: 136 meq/L (ref 135–145)

## 2023-09-27 LAB — LIPID PANEL
Cholesterol: 158 mg/dL (ref 0–200)
HDL: 51.4 mg/dL (ref >39.00)
LDL Cholesterol: 62 mg/dL (ref 0–99)
NonHDL: 107.07
Total CHOL/HDL Ratio: 3
Triglycerides: 225 mg/dL — ABNORMAL HIGH (ref 0.0–149.0)
VLDL: 45 mg/dL — ABNORMAL HIGH (ref 0.0–40.0)

## 2023-09-27 LAB — VITAMIN B12: Vitamin B-12: 568 pg/mL (ref 211–911)

## 2023-09-27 LAB — BRAIN NATRIURETIC PEPTIDE: Pro B Natriuretic peptide (BNP): 173 pg/mL — ABNORMAL HIGH (ref 0.0–100.0)

## 2023-09-27 LAB — HEMOGLOBIN A1C: Hgb A1c MFr Bld: 6.4 % (ref 4.6–6.5)

## 2023-09-27 LAB — VITAMIN D 25 HYDROXY (VIT D DEFICIENCY, FRACTURES): VITD: 34.75 ng/mL (ref 30.00–100.00)

## 2023-09-27 MED ORDER — METHYLPREDNISOLONE ACETATE 40 MG/ML IJ SUSP
40.0000 mg | Freq: Once | INTRAMUSCULAR | Status: AC
  Administered 2023-09-27: 40 mg via INTRAMUSCULAR

## 2023-09-27 MED ORDER — AZITHROMYCIN 250 MG PO TABS: ORAL_TABLET | ORAL | 1 refills | Status: AC

## 2023-09-27 MED ORDER — PREDNISONE 10 MG PO TABS: ORAL_TABLET | ORAL | 0 refills | Status: DC

## 2023-09-27 NOTE — Assessment & Plan Note (Addendum)
 Mild to mod, for antibx course,  zpack, cough med prn, prednisone  taper, depomedrol 80 mg IM, to f/u any worsening symptoms or concerns,l also for cxr

## 2023-09-27 NOTE — Assessment & Plan Note (Signed)
 Lab Results  Component Value Date   HGBA1C 6.4 09/27/2023   Stable, pt to continue current medical treatment  - diet, wt control

## 2023-09-27 NOTE — Progress Notes (Signed)
 Patient ID: KERIGAN NARVAEZ, female   DOB: [birth year], 78 y.o.   MRN: [id number]         Chief Complaint:: wellness exam and Shortness of Breath (Wheezing and shortness of breath for the past week. Also experiencing some coughing/chest congestion. Currently treating with nebulizer and inhaler. Patient does have COPD. )  , low vit d, preDM, htn       HPI:  GAYLENE MOYLAN is a 78 y.o. female here for wellness exam; up to date                Also with c/o 1 wk onset mild to mod sob doe wheezing , feeling warmish, but Pt denies chest pain, orthopnea, PND, increased LE swelling, palpitations, dizziness or syncope.  Pt denies chest pain, increased sob or doe, wheezing, orthopnea, PND, increased LE swelling, palpitations, dizziness or syncope.   Pt denies polydipsia, polyuria, or new focal neuro s/s.    Pt denies wt loss, night sweats, loss of appetite, or other constitutional symptoms    Wt Readings from Last 3 Encounters:  09/27/23 192 lb 9.6 oz (87.4 kg)  06/21/23 188 lb (85.3 kg)  03/11/23 187 lb 9.6 oz (85.1 kg)   BP Readings from Last 3 Encounters:  09/27/23 110/64  06/21/23 136/62  03/11/23 122/66   Immunization History  Administered Date(s) Administered   Fluad Quad(high Dose 65+) 01/20/2019, 04/20/2021, 02/03/2022   Fluad Trivalent(High Dose 65+) 01/11/2023   Influenza, High Dose Seasonal PF 03/12/2017, 04/06/2018, 03/07/2019   Influenza,inj,Quad PF,6+ Mos 12/01/2012, 01/31/2014, 02/24/2016   Influenza-Unspecified 11/28/2011, 01/11/2015   Pneumococcal Conjugate-13 08/15/2014   Pneumococcal Polysaccharide-23 05/30/2012   Td 11/28/2006   Tdap 09/07/2016   Zoster Recombinant(Shingrix) 01/11/2023, 04/28/2023   Zoster, Live 11/29/2008  There are no preventive care reminders to display for this patient.    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   Other and  unspecified hyperlipidemia 08/22/2013   Pre-diabetes    Past Surgical History:  Procedure Laterality Date   ABDOMINAL AORTIC ANEURYSM REPAIR N/A 04/28/2018   Procedure: ANEURYSM ABDOMINAL AORTIC REPAIR WITH HEMASHIELD GOLD VASCULAR GRAFT 22X11 mm;  Surgeon: Oris Krystal FALCON, MD;  Location: [facility] OR;  Service: Vascular;  Laterality: N/A;   CARDIAC CATHETERIZATION  03/29/2001   negative   DILATION AND CURETTAGE OF UTERUS  1985   TONSILLECTOMY Bilateral    age 76   TUBAL LIGATION  1985   XI ROBOTIC ASSISTED VENTRAL HERNIA N/A 01/22/2022   Procedure: XI ROBOTIC ASSISTED INCISIONAL HERNIA REPAIR WITH MESH;  Surgeon: Lyndel Deward PARAS, MD;  Location: [facility];  Service: General;  Laterality: N/A;    reports that she quit smoking about 5 years ago. Her smoking use included cigarettes. She started smoking about 35 years ago. She has a 15 pack-year smoking history. She has never used smokeless tobacco. She reports that she does not drink alcohol and does not use drugs. family history includes Congestive Heart Failure in her mother; Coronary artery disease in an other family member; Diabetes in her father and mother; Heart attack in her father; Hypertension in her mother. Allergies  Allergen Reactions   Hydrochlorothiazide Other (See Comments)    unknown   Current Outpatient Medications on File Prior to Visit  Medication Sig Dispense Refill   acetaminophen  (TYLENOL ) 500 MG tablet Take 1,000 mg by mouth every 6 (six)  hours as needed for mild pain.     albuterol  (PROVENTIL ) (2.5 MG/3ML) 0.083% nebulizer solution Take 3 mLs (2.5 mg total) by nebulization every 6 (six) hours as needed for wheezing or shortness of breath. 150 mL 3   albuterol  (VENTOLIN  HFA) 108 (90 Base) MCG/ACT inhaler TAKE 2 PUFFS BY MOUTH EVERY 6 HOURS AS NEEDED FOR WHEEZE OR SHORTNESS OF BREATH 8.5 each 0   ALPRAZolam  (XANAX ) 0.25 MG tablet TAKE 1 TABLET BY MOUTH TWICE A DAY AS NEEDED FOR ANXIETY 60 tablet 2   amLODipine  (NORVASC ) 10 MG  tablet TAKE 1 TABLET EVERY DAY 90 tablet 2   apixaban  (ELIQUIS ) 5 MG TABS tablet Take 1 tablet (5 mg total) by mouth 2 (two) times daily for 14 days.     apixaban  (ELIQUIS ) 5 MG TABS tablet Take 1 tablet (5 mg total) by mouth 2 (two) times daily. 180 tablet 1   apixaban  (ELIQUIS ) 5 MG TABS tablet Take 1 tablet (5 mg total) by mouth 2 (two) times daily. 180 tablet 3   Blood Glucose Monitoring Suppl (TRUE METRIX AIR GLUCOSE METER) w/Device KIT Use as directed four times per day 1 kit 3   Blood Glucose Monitoring Suppl (TRUE METRIX METER) DEVI Use as directed once daily E11.9 1 each 0   cholecalciferol  (VITAMIN D3) 25 MCG (1000 UNIT) tablet Take 1,000 Units by mouth daily.     citalopram  (CELEXA ) 10 MG tablet TAKE 1 TABLET EVERY DAY 90 tablet 2   citalopram  (CELEXA ) 10 MG tablet Take 1 tablet (10 mg total) by mouth daily. 90 tablet 2   doxycycline  (VIBRAMYCIN ) 100 MG capsule Take 1 capsule (100 mg total) by mouth 2 (two) times daily. 14 capsule 0   irbesartan  (AVAPRO ) 300 MG tablet Take 1 tablet (300 mg total) by mouth daily. 90 tablet 3   levalbuterol  (XOPENEX  HFA) 45 MCG/ACT inhaler Inhale 2 puffs into the lungs 4 (four) times daily. 1 each 12   metoprolol  tartrate (LOPRESSOR ) 50 MG tablet TAKE 1 TABLET TWICE DAILY 180 tablet 2   pravastatin  (PRAVACHOL ) 20 MG tablet TAKE 1 TABLET EVERY DAY 90 tablet 3   pravastatin  (PRAVACHOL ) 20 MG tablet Take 1 tablet (20 mg total) by mouth daily. 90 tablet 3   No current facility-administered medications on file prior to visit.        ROS:  All others reviewed and negative.  Objective        PE:  BP 110/64   Pulse (!) 59   Temp 98 F (36.7 C)   Ht 5' 6 (1.676 m)   Wt 192 lb 9.6 oz (87.4 kg)   LMP 03/29/1993 (Approximate)   SpO2 96%   BMI 31.09 kg/m                 Constitutional: Pt appears mild ill but non toxic, no accessory muscle use               HENT: Head: NCAT.                Right Ear: External ear normal.                 Left Ear:  External ear normal.                Eyes: . Pupils are equal, round, and reactive to light. Conjunctivae and EOM are normal               Nose: without d/c or deformity  Neck: Neck supple. Gross normal ROM               Cardiovascular: Normal rate and regular rhythm.                 Pulmonary/Chest: Effort normal and breath sounds decreased without rales but with few mild wheezing.                Abd:  Soft, NT, ND, + BS, no organomegaly               Neurological: Pt is alert. At baseline orientation, motor grossly intact               Skin: Skin is warm. No rashes, no other new lesions, LE edema - none               Psychiatric: Pt behavior is normal without agitation   Micro: none  Cardiac tracings I have personally interpreted today:  none  Pertinent Radiological findings (summarize): none   Lab Results  Component Value Date   WBC 8.9 09/27/2023   HGB 12.7 09/27/2023   HCT 38.1 09/27/2023   PLT 238.0 09/27/2023   GLUCOSE 123 (H) 09/27/2023   CHOL 158 09/27/2023   TRIG 225.0 (H) 09/27/2023   HDL 51.40 09/27/2023   LDLDIRECT 79.0 04/20/2021   LDLCALC 62 09/27/2023   ALT 24 09/27/2023   AST 25 09/27/2023   NA 136 09/27/2023   K 4.7 09/27/2023   CL 98 09/27/2023   CREATININE 0.89 09/27/2023   BUN 14 09/27/2023   CO2 31 09/27/2023   TSH 1.24 09/27/2023   INR 1.16 04/30/2018   HGBA1C 6.4 09/27/2023   Assessment/Plan:  KATELINN JUSTICE is a 78 y.o. White or Caucasian [1] female with  has a past medical history of BACK PAIN (03/14/2009), COPD (11/01/2006), Dyspnea, HYPERTENSION (11/01/2006), Hypertrophy of tongue papillae (06/05/2009), Other and unspecified hyperlipidemia (08/22/2013), and Pre-diabetes.  Encounter for well adult exam with abnormal findings Age and sex appropriate education and counseling updated with regular exercise and diet Referrals for preventative services - none needed Immunizations addressed - none needed Smoking counseling  - none  needed Evidence for depression or other mood disorder - none significant Most recent labs reviewed. I have personally reviewed and have noted: 1) the patient's medical and social history 2) The patient's current medications and supplements 3) The patient's height, weight, and BMI have been recorded in the chart   COPD exacerbation (HCC) Mild to mod, for antibx course,  zpack, cough med prn, prednisone  taper, depomedrol 80 mg IM, to f/u any worsening symptoms or concerns,l also for cxr  Essential hypertension BP Readings from Last 3 Encounters:  09/27/23 110/64  06/21/23 136/62  03/11/23 122/66   Stable, pt to continue medical treatment norvasc  10 every day, avapro  300 every day, lopressor  50 bid   Pre-diabetes Lab Results  Component Value Date   HGBA1C 6.4 09/27/2023   Stable, pt to continue current medical treatment  - diet, wt control   Vitamin D  deficiency Last vitamin D  Lab Results  Component Value Date   VD25OH 34.75 09/27/2023   Low, to start oral replacement  Followup: Return in about 6 months (around 03/29/2024).  Lynwood Rush, MD 09/27/2023 9:23 PM [facility]

## 2023-09-27 NOTE — Assessment & Plan Note (Signed)
 BP Readings from Last 3 Encounters:  09/27/23 110/64  06/21/23 136/62  03/11/23 122/66   Stable, pt to continue medical treatment norvasc  10 every day, avapro  300 every day, lopressor  50 bid

## 2023-09-27 NOTE — Progress Notes (Signed)
 The test results show that your current treatment is OK, as the tests are stable.  Please continue the same plan.  There is no other need for change of treatment or further evaluation based on these results, at this time.  thanks

## 2023-09-27 NOTE — Assessment & Plan Note (Signed)
 Last vitamin D  Lab Results  Component Value Date   VD25OH 34.75 09/27/2023   Low, to start oral replacement

## 2023-09-27 NOTE — Assessment & Plan Note (Signed)

## 2023-09-27 NOTE — Patient Instructions (Addendum)
 You had the steroid shot today  Please take all new medication as prescribed-  the antibiotic, prednisone   Please continue all other medications as before, including the nebulizer and inhaler  Please have the pharmacy call with any other refills you may need.  Please continue your efforts at being more active, low cholesterol diet, and weight control.  You are otherwise up to date with prevention measures today.  Please keep your appointments with your specialists as you may have planned  Please go to the XRAY Department in the first floor for the x-ray testing  Please go to the LAB at the blood drawing area for the tests to be done  You will be contacted by phone if any changes need to be made immediately.  Otherwise, you will receive a letter about your results with an explanation, but please check with MyChart first.  Please make an Appointment to return in 6 months, or sooner if needed

## 2023-09-28 ENCOUNTER — Ambulatory Visit (HOSPITAL_BASED_OUTPATIENT_CLINIC_OR_DEPARTMENT_OTHER)
Admission: RE | Admit: 2023-09-28 | Discharge: 2023-09-28 | Disposition: A | Discharge status: Home / Self Care | Source: Ambulatory Visit | Attending: Internal Medicine | Admitting: Internal Medicine

## 2023-09-28 ENCOUNTER — Other Ambulatory Visit: Payer: Self-pay | Admitting: Internal Medicine

## 2023-09-28 ENCOUNTER — Encounter (HOSPITAL_BASED_OUTPATIENT_CLINIC_OR_DEPARTMENT_OTHER): Payer: Self-pay

## 2023-09-28 ENCOUNTER — Ambulatory Visit: Payer: Self-pay | Admitting: Internal Medicine

## 2023-09-28 DIAGNOSIS — K8689 Other specified diseases of pancreas: Secondary | ICD-10-CM | POA: Diagnosis not present

## 2023-09-28 DIAGNOSIS — R7989 Other specified abnormal findings of blood chemistry: Secondary | ICD-10-CM | POA: Insufficient documentation

## 2023-09-28 DIAGNOSIS — I2510 Atherosclerotic heart disease of native coronary artery without angina pectoris: Secondary | ICD-10-CM | POA: Diagnosis not present

## 2023-09-28 DIAGNOSIS — R918 Other nonspecific abnormal finding of lung field: Secondary | ICD-10-CM | POA: Diagnosis not present

## 2023-09-28 LAB — D-DIMER, QUANTITATIVE: D-Dimer, Quant: 1.32 ug{FEU}/mL — ABNORMAL HIGH (ref <0.50)

## 2023-09-28 MED ORDER — IOHEXOL 350 MG/ML SOLN
100.0000 mL | Freq: Once | INTRAVENOUS | Status: AC | PRN
  Administered 2023-09-28: 75 mL via INTRAVENOUS

## 2023-10-04 NOTE — Telephone Encounter (Signed)
 Called pt and made appt for Friday, she will call and cancel if she begins to feel better

## 2023-10-05 ENCOUNTER — Other Ambulatory Visit: Payer: Self-pay

## 2023-10-05 ENCOUNTER — Inpatient Hospital Stay (HOSPITAL_BASED_OUTPATIENT_CLINIC_OR_DEPARTMENT_OTHER)
Admission: EM | Admit: 2023-10-05 | Discharge: 2023-10-07 | DRG: 190 | Disposition: A | Discharge status: Home / Self Care | Attending: Internal Medicine | Admitting: Internal Medicine

## 2023-10-05 ENCOUNTER — Emergency Department (HOSPITAL_BASED_OUTPATIENT_CLINIC_OR_DEPARTMENT_OTHER)

## 2023-10-05 ENCOUNTER — Encounter (HOSPITAL_BASED_OUTPATIENT_CLINIC_OR_DEPARTMENT_OTHER): Payer: Self-pay

## 2023-10-05 DIAGNOSIS — M549 Dorsalgia, unspecified: Secondary | ICD-10-CM | POA: Diagnosis present

## 2023-10-05 DIAGNOSIS — I480 Paroxysmal atrial fibrillation: Secondary | ICD-10-CM | POA: Diagnosis present

## 2023-10-05 DIAGNOSIS — Z1152 Encounter for screening for COVID-19: Secondary | ICD-10-CM

## 2023-10-05 DIAGNOSIS — Z6831 Body mass index (BMI) 31.0-31.9, adult: Secondary | ICD-10-CM

## 2023-10-05 DIAGNOSIS — Z833 Family history of diabetes mellitus: Secondary | ICD-10-CM

## 2023-10-05 DIAGNOSIS — I5033 Acute on chronic diastolic (congestive) heart failure: Secondary | ICD-10-CM | POA: Diagnosis not present

## 2023-10-05 DIAGNOSIS — J441 Chronic obstructive pulmonary disease with (acute) exacerbation: Secondary | ICD-10-CM | POA: Diagnosis not present

## 2023-10-05 DIAGNOSIS — I482 Chronic atrial fibrillation, unspecified: Secondary | ICD-10-CM | POA: Diagnosis not present

## 2023-10-05 DIAGNOSIS — G8929 Other chronic pain: Secondary | ICD-10-CM | POA: Diagnosis present

## 2023-10-05 DIAGNOSIS — E785 Hyperlipidemia, unspecified: Secondary | ICD-10-CM | POA: Diagnosis not present

## 2023-10-05 DIAGNOSIS — Z7401 Bed confinement status: Secondary | ICD-10-CM | POA: Diagnosis not present

## 2023-10-05 DIAGNOSIS — I10 Essential (primary) hypertension: Secondary | ICD-10-CM

## 2023-10-05 DIAGNOSIS — Z8249 Family history of ischemic heart disease and other diseases of the circulatory system: Secondary | ICD-10-CM

## 2023-10-05 DIAGNOSIS — Z7901 Long term (current) use of anticoagulants: Secondary | ICD-10-CM

## 2023-10-05 DIAGNOSIS — I130 Hypertensive heart and chronic kidney disease with heart failure and stage 1 through stage 4 chronic kidney disease, or unspecified chronic kidney disease: Secondary | ICD-10-CM | POA: Diagnosis present

## 2023-10-05 DIAGNOSIS — Z95828 Presence of other vascular implants and grafts: Secondary | ICD-10-CM

## 2023-10-05 DIAGNOSIS — R0602 Shortness of breath: Secondary | ICD-10-CM | POA: Diagnosis not present

## 2023-10-05 DIAGNOSIS — E871 Hypo-osmolality and hyponatremia: Secondary | ICD-10-CM | POA: Diagnosis present

## 2023-10-05 DIAGNOSIS — E66811 Obesity, class 1: Secondary | ICD-10-CM | POA: Diagnosis present

## 2023-10-05 DIAGNOSIS — I700 Atherosclerosis of aorta: Secondary | ICD-10-CM | POA: Diagnosis not present

## 2023-10-05 DIAGNOSIS — N1831 Chronic kidney disease, stage 3a: Secondary | ICD-10-CM | POA: Diagnosis present

## 2023-10-05 DIAGNOSIS — R7303 Prediabetes: Secondary | ICD-10-CM | POA: Diagnosis present

## 2023-10-05 DIAGNOSIS — I959 Hypotension, unspecified: Secondary | ICD-10-CM | POA: Diagnosis not present

## 2023-10-05 DIAGNOSIS — Z87891 Personal history of nicotine dependence: Secondary | ICD-10-CM

## 2023-10-05 DIAGNOSIS — Z79899 Other long term (current) drug therapy: Secondary | ICD-10-CM

## 2023-10-05 DIAGNOSIS — R531 Weakness: Secondary | ICD-10-CM | POA: Diagnosis not present

## 2023-10-05 DIAGNOSIS — Z888 Allergy status to other drugs, medicaments and biological substances status: Secondary | ICD-10-CM

## 2023-10-05 DIAGNOSIS — F419 Anxiety disorder, unspecified: Secondary | ICD-10-CM | POA: Diagnosis not present

## 2023-10-05 LAB — COMPREHENSIVE METABOLIC PANEL WITH GFR
ALT: 18 U/L (ref 0–44)
AST: 17 U/L (ref 15–41)
Albumin: 4.2 g/dL (ref 3.5–5.0)
Alkaline Phosphatase: 88 U/L (ref 38–126)
Anion gap: 13 (ref 5–15)
BUN: 16 mg/dL (ref 8–23)
CO2: 23 mmol/L (ref 22–32)
Calcium: 10.7 mg/dL — ABNORMAL HIGH (ref 8.9–10.3)
Chloride: 97 mmol/L — ABNORMAL LOW (ref 98–111)
Creatinine, Ser: 1.02 mg/dL — ABNORMAL HIGH (ref 0.44–1.00)
GFR, Estimated: 57 mL/min — ABNORMAL LOW (ref >60)
Glucose, Bld: 139 mg/dL — ABNORMAL HIGH (ref 70–99)
Potassium: 4.1 mmol/L (ref 3.5–5.1)
Sodium: 133 mmol/L — ABNORMAL LOW (ref 135–145)
Total Bilirubin: 0.6 mg/dL (ref 0.0–1.2)
Total Protein: 7.7 g/dL (ref 6.5–8.1)

## 2023-10-05 LAB — CBC WITH DIFFERENTIAL/PLATELET
Abs Immature Granulocytes: 0.08 K/uL — ABNORMAL HIGH (ref 0.00–0.07)
Basophils Absolute: 0.1 K/uL (ref 0.0–0.1)
Basophils Relative: 1 %
Eosinophils Absolute: 0.1 K/uL (ref 0.0–0.5)
Eosinophils Relative: 1 %
HCT: 39.7 % (ref 36.0–46.0)
Hemoglobin: 13.1 g/dL (ref 12.0–15.0)
Immature Granulocytes: 1 %
Lymphocytes Relative: 18 %
Lymphs Abs: 1.8 K/uL (ref 0.7–4.0)
MCH: 31.3 pg (ref 26.0–34.0)
MCHC: 33 g/dL (ref 30.0–36.0)
MCV: 95 fL (ref 80.0–100.0)
Monocytes Absolute: 1.2 K/uL — ABNORMAL HIGH (ref 0.1–1.0)
Monocytes Relative: 12 %
Neutro Abs: 6.6 K/uL (ref 1.7–7.7)
Neutrophils Relative %: 67 %
Platelets: 219 K/uL (ref 150–400)
RBC: 4.18 MIL/uL (ref 3.87–5.11)
RDW: 13 % (ref 11.5–15.5)
WBC: 9.7 K/uL (ref 4.0–10.5)
nRBC: 0 % (ref 0.0–0.2)

## 2023-10-05 LAB — I-STAT VENOUS BLOOD GAS, ED
Acid-Base Excess: 0 mmol/L (ref 0.0–2.0)
Bicarbonate: 25.2 mmol/L (ref 20.0–28.0)
Calcium, Ion: 1.32 mmol/L (ref 1.15–1.40)
HCT: 39 % (ref 36.0–46.0)
Hemoglobin: 13.3 g/dL (ref 12.0–15.0)
O2 Saturation: 54 %
Potassium: 3.9 mmol/L (ref 3.5–5.1)
Sodium: 132 mmol/L — ABNORMAL LOW (ref 135–145)
TCO2: 27 mmol/L (ref 22–32)
pCO2, Ven: 44 mmHg (ref 44–60)
pH, Ven: 7.366 (ref 7.25–7.43)
pO2, Ven: 30 mmHg — CL (ref 32–45)

## 2023-10-05 LAB — RESP PANEL BY RT-PCR (RSV, FLU A&B, COVID)  RVPGX2
Influenza A by PCR: NEGATIVE
Influenza B by PCR: NEGATIVE
Resp Syncytial Virus by PCR: NEGATIVE
SARS Coronavirus 2 by RT PCR: NEGATIVE

## 2023-10-05 LAB — PRO BRAIN NATRIURETIC PEPTIDE: Pro Brain Natriuretic Peptide: 810 pg/mL — ABNORMAL HIGH (ref <300.0)

## 2023-10-05 MED ORDER — APIXABAN 5 MG PO TABS
5.0000 mg | ORAL_TABLET | Freq: Two times a day (BID) | ORAL | Status: DC
  Administered 2023-10-05 – 2023-10-07 (×4): 5 mg via ORAL
  Filled 2023-10-05 (×4): qty 1

## 2023-10-05 MED ORDER — IRBESARTAN 300 MG PO TABS
300.0000 mg | ORAL_TABLET | Freq: Every day | ORAL | Status: DC
  Administered 2023-10-05 – 2023-10-07 (×3): 300 mg via ORAL
  Filled 2023-10-05 (×3): qty 1

## 2023-10-05 MED ORDER — AMLODIPINE BESYLATE 10 MG PO TABS
10.0000 mg | ORAL_TABLET | Freq: Every day | ORAL | Status: DC
  Administered 2023-10-05 – 2023-10-07 (×3): 10 mg via ORAL
  Filled 2023-10-05 (×3): qty 1

## 2023-10-05 MED ORDER — IPRATROPIUM-ALBUTEROL 0.5-2.5 (3) MG/3ML IN SOLN: 3.0000 mL | RESPIRATORY_TRACT | Status: DC

## 2023-10-05 MED ORDER — METHYLPREDNISOLONE SODIUM SUCC 125 MG IJ SOLR
60.0000 mg | Freq: Every day | INTRAMUSCULAR | Status: DC
  Administered 2023-10-06 – 2023-10-07 (×2): 60 mg via INTRAVENOUS
  Filled 2023-10-05 (×2): qty 2

## 2023-10-05 MED ORDER — IPRATROPIUM BROMIDE 0.02 % IN SOLN
0.5000 mg | Freq: Four times a day (QID) | RESPIRATORY_TRACT | Status: DC | PRN
  Administered 2023-10-05 – 2023-10-06 (×2): 0.5 mg via RESPIRATORY_TRACT
  Filled 2023-10-05 (×2): qty 2.5

## 2023-10-05 MED ORDER — ALBUTEROL SULFATE (2.5 MG/3ML) 0.083% IN NEBU
INHALATION_SOLUTION | RESPIRATORY_TRACT | Status: AC
  Filled 2023-10-05: qty 6

## 2023-10-05 MED ORDER — METHYLPREDNISOLONE SODIUM SUCC 125 MG IJ SOLR
125.0000 mg | Freq: Every day | INTRAMUSCULAR | Status: DC
  Administered 2023-10-05: 125 mg via INTRAVENOUS
  Filled 2023-10-05: qty 2

## 2023-10-05 MED ORDER — IPRATROPIUM-ALBUTEROL 0.5-2.5 (3) MG/3ML IN SOLN
RESPIRATORY_TRACT | Status: AC
  Administered 2023-10-05: 3 mL
  Filled 2023-10-05: qty 6

## 2023-10-05 MED ORDER — DOXYCYCLINE HYCLATE 100 MG PO CAPS: 100.0000 mg | ORAL_CAPSULE | Freq: Two times a day (BID) | ORAL | 0 refills | Status: DC

## 2023-10-05 MED ORDER — ALPRAZOLAM 0.25 MG PO TABS
0.2500 mg | ORAL_TABLET | Freq: Two times a day (BID) | ORAL | Status: DC | PRN
  Administered 2023-10-05 – 2023-10-06 (×4): 0.25 mg via ORAL
  Filled 2023-10-05 (×4): qty 1

## 2023-10-05 MED ORDER — IPRATROPIUM-ALBUTEROL 0.5-2.5 (3) MG/3ML IN SOLN
3.0000 mL | Freq: Once | RESPIRATORY_TRACT | Status: AC
  Administered 2023-10-05: 3 mL via RESPIRATORY_TRACT
  Filled 2023-10-05: qty 3

## 2023-10-05 MED ORDER — METOPROLOL TARTRATE 50 MG PO TABS
50.0000 mg | ORAL_TABLET | Freq: Two times a day (BID) | ORAL | Status: DC
  Administered 2023-10-05 – 2023-10-06 (×3): 50 mg via ORAL
  Filled 2023-10-05 (×4): qty 1

## 2023-10-05 MED ORDER — IPRATROPIUM-ALBUTEROL 0.5-2.5 (3) MG/3ML IN SOLN: 3.0000 mL | RESPIRATORY_TRACT | Status: DC | PRN

## 2023-10-05 MED ORDER — ALBUTEROL SULFATE (2.5 MG/3ML) 0.083% IN NEBU
5.0000 mg | INHALATION_SOLUTION | Freq: Once | RESPIRATORY_TRACT | Status: AC
  Administered 2023-10-05: 5 mg via RESPIRATORY_TRACT

## 2023-10-05 MED ORDER — IPRATROPIUM-ALBUTEROL 0.5-2.5 (3) MG/3ML IN SOLN
RESPIRATORY_TRACT | Status: AC
  Filled 2023-10-05: qty 3

## 2023-10-05 MED ORDER — PREDNISONE 10 MG PO TABS: 40.0000 mg | ORAL_TABLET | Freq: Every day | ORAL | 0 refills | Status: DC

## 2023-10-05 MED ORDER — IPRATROPIUM-ALBUTEROL 0.5-2.5 (3) MG/3ML IN SOLN: 3.0000 mL | Freq: Once | RESPIRATORY_TRACT | Status: DC

## 2023-10-05 MED ORDER — IPRATROPIUM-ALBUTEROL 0.5-2.5 (3) MG/3ML IN SOLN
6.0000 mL | Freq: Once | RESPIRATORY_TRACT | Status: AC
  Administered 2023-10-05: 6 mL via RESPIRATORY_TRACT

## 2023-10-05 MED ORDER — IPRATROPIUM-ALBUTEROL 0.5-2.5 (3) MG/3ML IN SOLN
3.0000 mL | RESPIRATORY_TRACT | Status: DC
  Administered 2023-10-05: 3 mL via RESPIRATORY_TRACT
  Filled 2023-10-05: qty 3

## 2023-10-05 MED ORDER — LEVALBUTEROL HCL 0.63 MG/3ML IN NEBU
0.6300 mg | INHALATION_SOLUTION | Freq: Four times a day (QID) | RESPIRATORY_TRACT | Status: DC | PRN
  Administered 2023-10-05 – 2023-10-06 (×2): 0.63 mg via RESPIRATORY_TRACT
  Filled 2023-10-05 (×2): qty 3

## 2023-10-05 MED ORDER — CITALOPRAM HYDROBROMIDE 20 MG PO TABS
10.0000 mg | ORAL_TABLET | Freq: Every day | ORAL | Status: DC
  Administered 2023-10-05 – 2023-10-07 (×3): 10 mg via ORAL
  Filled 2023-10-05 (×3): qty 1

## 2023-10-05 MED ORDER — BENZONATATE 100 MG PO CAPS
200.0000 mg | ORAL_CAPSULE | Freq: Three times a day (TID) | ORAL | Status: DC | PRN
  Administered 2023-10-05 – 2023-10-07 (×5): 200 mg via ORAL
  Filled 2023-10-05 (×5): qty 2

## 2023-10-05 MED ORDER — PRAVASTATIN SODIUM 20 MG PO TABS
20.0000 mg | ORAL_TABLET | Freq: Every day | ORAL | Status: DC
  Administered 2023-10-05 – 2023-10-07 (×3): 20 mg via ORAL
  Filled 2023-10-05 (×3): qty 1

## 2023-10-05 NOTE — H&P (Signed)
 History and Physical    Patient: Leah Mata FMW:[id number] DOB: 04/24/1945 DOA: 10/05/2023 DOS: the patient was seen and examined on 10/05/2023 PCP: Norleen Lynwood ORN, MD  Patient coming from: Home  Chief Complaint:  Chief Complaint  Patient presents with   Shortness of Breath   HPI: Leah Mata is a 78 y.o. female with medical history significant of COPD, hypertension. 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 She denies any fevers, chills, nausea, vomiting and abdominal pain, dysuria, chest pain. She was seen in PCP office and was given steroid taper and z pack. She reports having improved after steroid taper but after the completion of steroids her dyspnea returned.  She underwent CT angio of the chest showing pulmonary nodules and no PE.  She was referred to [facility] for admission for acute copd exacerbation.   Review of Systems: As mentioned in the history of present illness. All other systems reviewed and are negative. 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   Procedure: ANEURYSM ABDOMINAL AORTIC REPAIR WITH HEMASHIELD GOLD VASCULAR GRAFT 22X11 mm;  Surgeon: Oris Krystal FALCON, MD;  Location: [facility] OR;  Service: Vascular;  Laterality: N/A;   CARDIAC CATHETERIZATION  03/29/2001   negative   DILATION AND CURETTAGE OF UTERUS  1985   TONSILLECTOMY Bilateral    age 62   TUBAL LIGATION  1985   XI ROBOTIC ASSISTED VENTRAL HERNIA N/A 01/22/2022   Procedure: XI ROBOTIC ASSISTED INCISIONAL HERNIA REPAIR WITH MESH;  Surgeon: Lyndel Deward PARAS, MD;  Location: [facility];  Service: General;   Laterality: N/A;   Social History:  reports that she quit smoking about 5 years ago. Her smoking use included cigarettes. She started smoking about 35 years ago. She has a 15 pack-year smoking history. She has never used smokeless tobacco. She reports that she does not drink alcohol and does not use drugs.  Allergies  Allergen Reactions   Hydrochlorothiazide Other (See Comments)    unknown    Family History  Problem Relation Age of Onset   Coronary artery disease Other        female 1st degree relative <60 and female 1st degree relative <50   Diabetes Father    Heart attack Father    Diabetes Mother    Hypertension Mother    Congestive Heart Failure Mother    Colon cancer Neg Hx    Breast cancer Neg Hx     Prior to Admission medications   Medication Sig Start Date End Date Taking? Authorizing Provider  [facility] injection Inject 0.5 mLs into the muscle once. 04/28/23  Yes [provider]  acetaminophen  (TYLENOL ) 500 MG tablet Take 1,000 mg by mouth every 6 (six) hours as needed for mild pain.    [provider]  albuterol  (PROVENTIL ) (2.5 MG/3ML) 0.083% nebulizer solution Take 3 mLs (2.5 mg total) by nebulization every 6 (six) hours as needed for wheezing or shortness of breath. 09/22/23   Norleen Lynwood ORN, MD  albuterol  (VENTOLIN  HFA) 108 (90 Base) MCG/ACT inhaler TAKE 2 PUFFS BY MOUTH EVERY 6 HOURS AS NEEDED FOR WHEEZE  OR SHORTNESS OF BREATH 07/13/23   Norleen Lynwood ORN, MD  ALPRAZolam  (XANAX ) 0.25 MG tablet TAKE 1 TABLET BY MOUTH TWICE A DAY AS NEEDED FOR ANXIETY 07/14/23   Norleen Lynwood ORN, MD  amLODipine  (NORVASC ) 10 MG tablet TAKE 1 TABLET EVERY DAY 09/13/22   Norleen Lynwood ORN, MD  apixaban  (ELIQUIS ) 5 MG TABS tablet Take 1 tablet (5 mg total) by mouth 2 (two) times daily. 11/19/22   Lonni Slain, MD  Blood Glucose Monitoring Suppl (TRUE METRIX AIR GLUCOSE METER) w/Device KIT Use as directed four times per day 05/14/22   Norleen Lynwood ORN, MD  cholecalciferol  (VITAMIN D3) 25  MCG (1000 UNIT) tablet Take 1,000 Units by mouth daily.    [provider]  citalopram  (CELEXA ) 10 MG tablet Take 1 tablet (10 mg total) by mouth daily. 05/19/22   Norleen Lynwood ORN, MD  irbesartan  (AVAPRO ) 300 MG tablet Take 1 tablet (300 mg total) by mouth daily. 09/20/22   Norleen Lynwood ORN, MD  levalbuterol  (XOPENEX  HFA) 45 MCG/ACT inhaler Inhale 2 puffs into the lungs 4 (four) times daily. 06/22/23   Norleen Lynwood ORN, MD  metoprolol  tartrate (LOPRESSOR ) 50 MG tablet TAKE 1 TABLET TWICE DAILY 09/13/22   Norleen Lynwood ORN, MD  pravastatin  (PRAVACHOL ) 20 MG tablet Take 1 tablet (20 mg total) by mouth daily. 01/12/23   Norleen Lynwood ORN, MD    Physical Exam: Vitals:   10/05/23 1300 10/05/23 1330 10/05/23 1400 10/05/23 1421  BP: (!) 143/83 (!) 132/58  (!) 158/69  Pulse: 92 78  94  Resp: (!) 25 (!) 21  16  Temp:    97.9 F (36.6 C)  TempSrc:    Oral  SpO2: 94% 95%  95%  Weight:   86.2 kg   Height:   5' 4 (1.626 m)    General exam: Appears calm and comfortable  Respiratory system: bilateral expiratory wheezing anterior and posterior. Air entry fair.  Cardiovascular system: S1 & S2 heard, RRR. No JVD, Gastrointestinal system: Abdomen is nondistended, soft and nontender. Central nervous system: Alert and oriented. No focal neurological deficits. Extremities: Symmetric 5 x 5 power. Skin: No rashes,  Psychiatry: Mood & affect appropriate.   Data Reviewed: Results for orders placed or performed during the hospital encounter of 10/05/23 (from the past 24 hours)  Resp panel by RT-PCR (RSV, Flu A&B, Covid) Anterior Nasal Swab     Status: None   Collection Time: 10/05/23  8:17 AM   Specimen: Anterior Nasal Swab  Result Value Ref Range   SARS Coronavirus 2 by RT PCR NEGATIVE NEGATIVE   Influenza A by PCR NEGATIVE NEGATIVE   Influenza B by PCR NEGATIVE NEGATIVE   Resp Syncytial Virus by PCR NEGATIVE NEGATIVE  Comprehensive metabolic panel     Status: Abnormal   Collection Time: 10/05/23  8:18 AM   Result Value Ref Range   Sodium 133 (L) 135 - 145 mmol/L   Potassium 4.1 3.5 - 5.1 mmol/L   Chloride 97 (L) 98 - 111 mmol/L   CO2 23 22 - 32 mmol/L   Glucose, Bld 139 (H) 70 - 99 mg/dL   BUN 16 8 - 23 mg/dL   Creatinine, Ser 8.97 (H) 0.44 - 1.00 mg/dL   Calcium  10.7 (H) 8.9 - 10.3 mg/dL   Total Protein 7.7 6.5 - 8.1 g/dL   Albumin  4.2 3.5 - 5.0 g/dL   AST 17 15 - 41 U/L   ALT 18 0 - 44 U/L   Alkaline Phosphatase  88 38 - 126 U/L   Total Bilirubin 0.6 0.0 - 1.2 mg/dL   GFR, Estimated 57 (L) >60 mL/min   Anion gap 13 5 - 15  CBC with Differential     Status: Abnormal   Collection Time: 10/05/23  8:18 AM  Result Value Ref Range   WBC 9.7 4.0 - 10.5 K/uL   RBC 4.18 3.87 - 5.11 MIL/uL   Hemoglobin 13.1 12.0 - 15.0 g/dL   HCT 60.2 63.9 - 53.9 %   MCV 95.0 80.0 - 100.0 fL   MCH 31.3 26.0 - 34.0 pg   MCHC 33.0 30.0 - 36.0 g/dL   RDW 86.9 88.4 - 84.4 %   Platelets 219 150 - 400 K/uL   nRBC 0.0 0.0 - 0.2 %   Neutrophils Relative % 67 %   Neutro Abs 6.6 1.7 - 7.7 K/uL   Lymphocytes Relative 18 %   Lymphs Abs 1.8 0.7 - 4.0 K/uL   Monocytes Relative 12 %   Monocytes Absolute 1.2 (H) 0.1 - 1.0 K/uL   Eosinophils Relative 1 %   Eosinophils Absolute 0.1 0.0 - 0.5 K/uL   Basophils Relative 1 %   Basophils Absolute 0.1 0.0 - 0.1 K/uL   Immature Granulocytes 1 %   Abs Immature Granulocytes 0.08 (H) 0.00 - 0.07 K/uL  Pro Brain natriuretic peptide     Status: Abnormal   Collection Time: 10/05/23  8:18 AM  Result Value Ref Range   Pro Brain Natriuretic Peptide 810.0 (H) <300.0 pg/mL  I-Stat venous blood gas, ([facility] ED, [facility], [facility])     Status: Abnormal   Collection Time: 10/05/23  8:19 AM  Result Value Ref Range   pH, Ven 7.366 7.25 - 7.43   pCO2, Ven 44.0 44 - 60 mmHg   pO2, Ven 30 (LL) 32 - 45 mmHg   Bicarbonate 25.2 20.0 - 28.0 mmol/L   TCO2 27 22 - 32 mmol/L   O2 Saturation 54 %   Acid-Base Excess 0.0 0.0 - 2.0 mmol/L   Sodium 132 (L) 135 - 145 mmol/L   Potassium 3.9 3.5 - 5.1  mmol/L   Calcium , Ion 1.32 1.15 - 1.40 mmol/L   HCT 39.0 36.0 - 46.0 %   Hemoglobin 13.3 12.0 - 15.0 g/dL   Collection site IV start    Drawn by Nurse    Sample type VENOUS    Comment NOTIFIED PHYSICIAN      Assessment and Plan:  Dyspnea , no chest pain Acute COPD exacerbation:  Wheezing persistently. Started her on IV solumedrol 60 mg daily, and duonebs. Respiratory panel is negative.  CXR is negative. CT angio a week ago negative for PE.  Add on tessalon  pearls.    Elevated pro bnp Check echocardiogram as she reports persistent dyspnea for more than 2 weeks despite steroid course.  No pedal edema.     Hypertension Well controlled.    Atrial fibrillation Rate controlled with metoprolol  and on eliquis  for anticoagulation.    Hyperlipidemia Resume statin.    Mild hyponatremia Monitor.       Advance Care Planning:   Code Status: Full Code   Consults: none.   Family Communication: none at bedside.   Severity of Illness: The appropriate patient status for this patient is OBSERVATION. Observation status is judged to be reasonable and necessary in order to provide the required intensity of service to ensure the patient's safety. The patient's presenting symptoms, physical exam findings, and initial radiographic and laboratory data in  the context of their medical condition is felt to place them at decreased risk for further clinical deterioration. Furthermore, it is anticipated that the patient will be medically stable for discharge from the hospital within 2 midnights of admission.   Author: Elgie Butter, MD 10/05/2023 3:26 PM  For on call review [email or web address].

## 2023-10-05 NOTE — ED Notes (Signed)
 Cleaned patient up with fresh linens. While patient was standing we got a urine sample that was sent to lab. Placed female pure wick on patient. Patient is resting in bed at this time.

## 2023-10-05 NOTE — Progress Notes (Signed)
 Plan of Care Note for accepted transfer   Patient: Leah Mata MRN: [id number]   DOA: 10/05/2023  Facility requesting transfer: [facility]. Requesting Provider: Lynwood Moulder, MD Reason for transfer: COPD exacerbation. Facility course:  Per ED provider:  Patient presents to ED with her son and daughter in law. Past medical history of COPD, CHF, CKD, AAA, HTN, chronic back pain, paroxysmal atrial fibrillation. Two weeks ago it started getting more difficult to breath. She has been seen by her PCP twice since then and has tried a steroid taper, zpack, and mucinex . She has finished the steroid taper and zpack but does not feel like these helped her breathing. She believes the mucinex  has decreased her sputum. She states she came in today because she is tired of fighting it. She denies shortness of breath, chest pain, heart palpitations, new back pain, changes in vision, trouble swallowing, gait imbalance, fevers or chills. She denies numbness in her toes and fingers. She did have diarrhea yesterday but otherwise urination and bowel movements have been normal.  She does have an inhaler at home but is not on oxygen at home. Her daughter in law states the patient also has indigestion and has been more unsteady on her feet. She has not smoked since 2020.   Plan of care: The patient is accepted for admission to Telemetry unit, at [facility]..  She has received Solu-Medrol  and bronchodilators in the emergency department.  Author: Alm Dorn Castor, MD 10/05/2023  Check [email or web address] for on-call coverage.  Nursing staff, Please call [facility] Admits & Consults System-Wide number on Amion as soon as patient's arrival, so appropriate admitting provider can evaluate the pt.

## 2023-10-05 NOTE — ED Notes (Signed)
 DC paperwork given and verbally understood.

## 2023-10-05 NOTE — ED Provider Notes (Addendum)
 [place] EMERGENCY DEPARTMENT AT [facility] Provider Note   CSN: [id number] Arrival date & time: 10/05/23  9294     Patient presents with: Shortness of Breath   Leah Mata is a 77 y.o. female.   Patient presents to ED with her son and daughter in law. Past medical history of COPD, CHF, CKD, AAA, HTN, chronic back pain, paroxysmal atrial fibrillation. Two weeks ago it started getting more difficult to breath. She has been seen by her PCP twice since then and has tried a steroid taper, zpack, and mucinex . She has finished the steroid taper and zpack but does not feel like these helped her breathing. She believes the mucinex  has decreased her sputum. She states she came in today because she is tired of fighting it. She denies shortness of breath, chest pain, heart palpitations, new back pain, changes in vision, trouble swallowing, gait imbalance, fevers or chills. She denies numbness in her toes and fingers. She did have diarrhea yesterday but otherwise urination and bowel movements have been normal.  She does have an inhaler at home but is not on oxygen at home. Her daughter in law states the patient also has indigestion and has been more unsteady on her feet. She has not smoked since 2020.   The history is provided by the patient and a friend.  Shortness of Breath Onset quality:  Gradual Duration:  2 weeks Progression:  Worsening Relieved by:  Nothing Worsened by:  Exertion and emotional stress Associated symptoms: wheezing   Associated symptoms: no abdominal pain, no chest pain, no cough, no diaphoresis, no fever, no neck pain, no syncope and no vomiting        Prior to Admission medications   Medication Sig Start Date End Date Taking? Authorizing Provider  acetaminophen  (TYLENOL ) 500 MG tablet Take 1,000 mg by mouth every 6 (six) hours as needed for mild pain.    [provider]  albuterol  (PROVENTIL ) (2.5 MG/3ML) 0.083% nebulizer solution Take 3 mLs (2.5 mg  total) by nebulization every 6 (six) hours as needed for wheezing or shortness of breath. 09/22/23   Norleen Lynwood ORN, MD  albuterol  (VENTOLIN  HFA) 108 (90 Base) MCG/ACT inhaler TAKE 2 PUFFS BY MOUTH EVERY 6 HOURS AS NEEDED FOR WHEEZE OR SHORTNESS OF BREATH 07/13/23   Norleen Lynwood ORN, MD  ALPRAZolam  (XANAX ) 0.25 MG tablet TAKE 1 TABLET BY MOUTH TWICE A DAY AS NEEDED FOR ANXIETY 07/14/23   Norleen Lynwood ORN, MD  amLODipine  (NORVASC ) 10 MG tablet TAKE 1 TABLET EVERY DAY 09/13/22   Norleen Lynwood ORN, MD  apixaban  (ELIQUIS ) 5 MG TABS tablet Take 1 tablet (5 mg total) by mouth 2 (two) times daily for 14 days. 02/11/23 09/27/23  Lonni Slain, MD  apixaban  (ELIQUIS ) 5 MG TABS tablet Take 1 tablet (5 mg total) by mouth 2 (two) times daily. 04/13/23   Lonni Slain, MD  apixaban  (ELIQUIS ) 5 MG TABS tablet Take 1 tablet (5 mg total) by mouth 2 (two) times daily. 11/19/22   Lonni Slain, MD  Blood Glucose Monitoring Suppl (TRUE METRIX AIR GLUCOSE METER) w/Device KIT Use as directed four times per day 05/14/22   Norleen Lynwood ORN, MD  Blood Glucose Monitoring Suppl (TRUE METRIX METER) DEVI Use as directed once daily E11.9 02/03/22   Norleen Lynwood ORN, MD  cholecalciferol  (VITAMIN D3) 25 MCG (1000 UNIT) tablet Take 1,000 Units by mouth daily.    [provider]  citalopram  (CELEXA ) 10 MG tablet TAKE 1 TABLET EVERY DAY  05/14/22   Norleen Lynwood ORN, MD  citalopram  (CELEXA ) 10 MG tablet Take 1 tablet (10 mg total) by mouth daily. 05/19/22   Norleen Lynwood ORN, MD  doxycycline  (VIBRAMYCIN ) 100 MG capsule Take 1 capsule (100 mg total) by mouth 2 (two) times daily. 03/11/23   Alvia Corean CROME, FNP  irbesartan  (AVAPRO ) 300 MG tablet Take 1 tablet (300 mg total) by mouth daily. 09/20/22   Norleen Lynwood ORN, MD  levalbuterol  (XOPENEX  HFA) 45 MCG/ACT inhaler Inhale 2 puffs into the lungs 4 (four) times daily. 06/22/23   Norleen Lynwood ORN, MD  metoprolol  tartrate (LOPRESSOR ) 50 MG tablet TAKE 1 TABLET TWICE DAILY 09/13/22   Norleen Lynwood ORN, MD  pravastatin  (PRAVACHOL ) 20 MG tablet TAKE 1 TABLET EVERY DAY 01/10/23   Norleen Lynwood ORN, MD  pravastatin  (PRAVACHOL ) 20 MG tablet Take 1 tablet (20 mg total) by mouth daily. 01/12/23   Norleen Lynwood ORN, MD  predniSONE  (DELTASONE ) 10 MG tablet 3 tabs by mouth per day for 3 days,2tabs per day for 3 days,1tab per day for 3 days 09/27/23   Norleen Lynwood ORN, MD    Allergies: Hydrochlorothiazide    Review of Systems  Constitutional:  Negative for diaphoresis and fever.  Respiratory:  Positive for shortness of breath and wheezing. Negative for cough.   Cardiovascular:  Negative for chest pain and syncope.  Gastrointestinal:  Negative for abdominal pain and vomiting.  Musculoskeletal:  Negative for neck pain.    Updated Vital Signs BP (!) 160/69 (BP Location: Right Arm)   Pulse 74   Temp 98.3 F (36.8 C) (Oral)   Resp 19   Ht 5' 6 (1.676 m)   Wt 87.4 kg   LMP 03/29/1993 (Approximate)   SpO2 95%   BMI 31.09 kg/m   Physical Exam HENT:     Head: Atraumatic.  Eyes:     Extraocular Movements: Extraocular movements intact.  Neck:     Vascular: No JVD.  Cardiovascular:     Pulses: Normal pulses.     Comments: Distant heart sounds Pulmonary:     Breath sounds: Examination of the right-upper field reveals wheezing. Examination of the left-upper field reveals wheezing. Examination of the right-middle field reveals wheezing. Examination of the left-middle field reveals wheezing. Examination of the right-lower field reveals wheezing. Examination of the left-lower field reveals wheezing. Wheezing present.  Abdominal:     General: Bowel sounds are normal.     Tenderness: There is no abdominal tenderness. There is no guarding or rebound.  Musculoskeletal:        General: Normal range of motion.     Cervical back: Normal range of motion.     Right lower leg: No edema.     Left lower leg: No edema.  Lymphadenopathy:     Cervical: No cervical adenopathy.  Skin:    General: Skin is warm.   Neurological:     General: No focal deficit present.     Mental Status: She is alert and oriented to person, place, and time.     (all labs ordered are listed, but only abnormal results are displayed) Labs Reviewed  RESP PANEL BY RT-PCR (RSV, FLU A&B, COVID)  [id number]  COMPREHENSIVE METABOLIC PANEL WITH GFR  CBC  CBC WITH DIFFERENTIAL/PLATELET  I-STAT VENOUS BLOOD GAS, ED    EKG: EKG Interpretation Date/Time:  Wednesday October 05 2023 07:16:12 EDT Ventricular Rate:  72 PR Interval:  188 QRS Duration:  72 QT Interval:  382 QTC Calculation: 421 R  Axis:   55  Text Interpretation: Sinus rhythm RSR' in V1 or V2, probably normal variant Minimal ST depression, inferior leads Confirmed by Jerrol Agent ([id number]) on 10/05/2023 7:18:04 AM  Radiology: No results found.   Procedures   Medications Ordered in the ED  ipratropium-albuterol  (DUONEB) 0.5-2.5 (3) MG/3ML nebulizer solution (  Not Given 10/05/23 0718)  albuterol  (PROVENTIL ) (2.5 MG/3ML) 0.083% nebulizer solution (has no administration in time range)  ipratropium-albuterol  (DUONEB) 0.5-2.5 (3) MG/3ML nebulizer solution 6 mL (6 mLs Nebulization Given 10/05/23 0717)  albuterol  (PROVENTIL ) (2.5 MG/3ML) 0.083% nebulizer solution 5 mg (5 mg Nebulization Given 10/05/23 0738)                                    Medical Decision Making Amount and/or Complexity of Data Reviewed Labs: ordered. Radiology: ordered.  Risk Prescription drug management. Decision regarding hospitalization.   This patient presents to the ED for concern of shortness of breath, this involves an extensive number of treatment options, and is a complaint that carries with it a high risk of complications and morbidity.  The differential diagnosis includes COPD exacerbation, pneumonia, upper respiratory infection, heart failure exacerbation, myocardial infarction, pulmonary embolism, abdominal aneurysm   Co morbidities / Chronic conditions that complicate the patient  evaluation  COPD, history of smoking, hypertension, heart failure, AAA repair after rupture, prediabetic   Additional history obtained:  Additional history obtained from EMR External records from outside source obtained and reviewed including CTA PE on 09/28/23 negative for PR   Lab Tests:  I Ordered, and personally interpreted labs.  The pertinent results include:   CMP: Na 133, Cl 97, Glu 139, Cr 1.02, Ca 10.7 CBC: wnl BNP: 810, normal for age VBG: wnl Resp panel negative for viral illness   Imaging Studies ordered:  I ordered imaging studies including chest x ray  I independently visualized and interpreted imaging which showed no acute cardiopulmonary abnormalities I agree with the radiologist interpretation   Cardiac Monitoring: / EKG:  The patient was maintained on a cardiac monitor.  I personally viewed and interpreted the cardiac monitored which showed an underlying rhythm of: sinus rhythm   Problem List / ED Course / Critical interventions / Medication management  COPD exacerbation  After 2 nebulizer treatments, the patient reports feeling better than she has felt in 2 weeks. Her voice is clearer and is using less energy/cognition to breath. Her lab work was negative for an infection, her heart is in normal sinus, she's not hypercarbic, and is dramatically better after respiratory therapy.  Her wheezing is better on auscultation after the two nebulizer treatments.   EKG and BNP (normal for her age) wnl making heart failure exacerbation less likely. Resp panel negative making infection less likely. The patient denies abdominal and back pain, has stable blood pressure, making aneurysmal rupture less likely.  CXR negative for occult pneumonia. I ordered medication including methylprednisolone , ipratropium-albuterol , and albuterol .   Reevaluation of the patient after these medicines showed that the patient was feeling back to her baseline.  The patient was considered for  discharged but her dyspnea began to return, and worsened with ambulation.  I have reviewed the patients home medicines and have made adjustments as needed   Test / Admission - Considered:  The patient presented with normal cognitive function and without any alteration. She had resolution of dyspnea within two hours, does not have a fever, BUN 16, RR  18, and BP 145/71. Discharge was considered but within less than an hour off the nebulizer, her dyspnea return, continued to worsen, and increasing respiratory effort when walking. Given her quick return to dyspnea despite nebulizer treatment and failing antibiotic (zpack) outpatient, the decision was made to admit the patient. The patient and family are in agreement.      Final diagnoses:  None    ED Discharge Orders     None          Charmayne Holmes, DO 10/05/23 [id number]    Jerrol Agent, MD 10/05/23 0955    Charmayne Holmes, DO 10/05/23 1029    Jerrol Agent, MD 10/05/23 1102    Jerrol Agent, MD 10/05/23 [phone]

## 2023-10-05 NOTE — ED Triage Notes (Signed)
 SOB with audible wheezing with cough and runny nose onset 2 weeks ago. PMH COPD. Has been taking musinex.

## 2023-10-05 NOTE — ED Notes (Signed)
 I-Stat given and performed by [facility].

## 2023-10-05 NOTE — ED Notes (Signed)
[place] with  cl called for transport 

## 2023-10-06 ENCOUNTER — Observation Stay (HOSPITAL_COMMUNITY)

## 2023-10-06 DIAGNOSIS — I480 Paroxysmal atrial fibrillation: Secondary | ICD-10-CM

## 2023-10-06 DIAGNOSIS — J441 Chronic obstructive pulmonary disease with (acute) exacerbation: Secondary | ICD-10-CM | POA: Diagnosis not present

## 2023-10-06 DIAGNOSIS — M549 Dorsalgia, unspecified: Secondary | ICD-10-CM | POA: Diagnosis not present

## 2023-10-06 DIAGNOSIS — G8929 Other chronic pain: Secondary | ICD-10-CM | POA: Diagnosis not present

## 2023-10-06 DIAGNOSIS — Z7901 Long term (current) use of anticoagulants: Secondary | ICD-10-CM | POA: Diagnosis not present

## 2023-10-06 DIAGNOSIS — R0602 Shortness of breath: Secondary | ICD-10-CM

## 2023-10-06 DIAGNOSIS — Z95828 Presence of other vascular implants and grafts: Secondary | ICD-10-CM | POA: Diagnosis not present

## 2023-10-06 DIAGNOSIS — E785 Hyperlipidemia, unspecified: Secondary | ICD-10-CM | POA: Diagnosis not present

## 2023-10-06 DIAGNOSIS — I5033 Acute on chronic diastolic (congestive) heart failure: Secondary | ICD-10-CM | POA: Diagnosis not present

## 2023-10-06 DIAGNOSIS — F419 Anxiety disorder, unspecified: Secondary | ICD-10-CM | POA: Diagnosis not present

## 2023-10-06 DIAGNOSIS — Z6831 Body mass index (BMI) 31.0-31.9, adult: Secondary | ICD-10-CM | POA: Diagnosis not present

## 2023-10-06 DIAGNOSIS — Z79899 Other long term (current) drug therapy: Secondary | ICD-10-CM | POA: Diagnosis not present

## 2023-10-06 DIAGNOSIS — Z1152 Encounter for screening for COVID-19: Secondary | ICD-10-CM | POA: Diagnosis not present

## 2023-10-06 DIAGNOSIS — Z87891 Personal history of nicotine dependence: Secondary | ICD-10-CM | POA: Diagnosis not present

## 2023-10-06 DIAGNOSIS — I130 Hypertensive heart and chronic kidney disease with heart failure and stage 1 through stage 4 chronic kidney disease, or unspecified chronic kidney disease: Secondary | ICD-10-CM | POA: Diagnosis not present

## 2023-10-06 DIAGNOSIS — N1831 Chronic kidney disease, stage 3a: Secondary | ICD-10-CM | POA: Diagnosis not present

## 2023-10-06 DIAGNOSIS — Z833 Family history of diabetes mellitus: Secondary | ICD-10-CM | POA: Diagnosis not present

## 2023-10-06 DIAGNOSIS — Z8249 Family history of ischemic heart disease and other diseases of the circulatory system: Secondary | ICD-10-CM | POA: Diagnosis not present

## 2023-10-06 DIAGNOSIS — I10 Essential (primary) hypertension: Secondary | ICD-10-CM | POA: Diagnosis not present

## 2023-10-06 DIAGNOSIS — E871 Hypo-osmolality and hyponatremia: Secondary | ICD-10-CM | POA: Diagnosis not present

## 2023-10-06 DIAGNOSIS — E66811 Obesity, class 1: Secondary | ICD-10-CM | POA: Diagnosis not present

## 2023-10-06 DIAGNOSIS — R7303 Prediabetes: Secondary | ICD-10-CM | POA: Diagnosis not present

## 2023-10-06 DIAGNOSIS — Z888 Allergy status to other drugs, medicaments and biological substances status: Secondary | ICD-10-CM | POA: Diagnosis not present

## 2023-10-06 LAB — CBC WITH DIFFERENTIAL/PLATELET
Abs Immature Granulocytes: 0.16 K/uL — ABNORMAL HIGH (ref 0.00–0.07)
Basophils Absolute: 0 K/uL (ref 0.0–0.1)
Basophils Relative: 0 %
Eosinophils Absolute: 0 K/uL (ref 0.0–0.5)
Eosinophils Relative: 0 %
HCT: 36.7 % (ref 36.0–46.0)
Hemoglobin: 12 g/dL (ref 12.0–15.0)
Immature Granulocytes: 2 %
Lymphocytes Relative: 9 %
Lymphs Abs: 1 K/uL (ref 0.7–4.0)
MCH: 32.3 pg (ref 26.0–34.0)
MCHC: 32.7 g/dL (ref 30.0–36.0)
MCV: 98.7 fL (ref 80.0–100.0)
Monocytes Absolute: 1.1 K/uL — ABNORMAL HIGH (ref 0.1–1.0)
Monocytes Relative: 10 %
Neutro Abs: 8.7 K/uL — ABNORMAL HIGH (ref 1.7–7.7)
Neutrophils Relative %: 79 %
Platelets: 187 K/uL (ref 150–400)
RBC: 3.72 MIL/uL — ABNORMAL LOW (ref 3.87–5.11)
RDW: 13 % (ref 11.5–15.5)
WBC: 11 K/uL — ABNORMAL HIGH (ref 4.0–10.5)
nRBC: 0 % (ref 0.0–0.2)

## 2023-10-06 LAB — EXPECTORATED SPUTUM ASSESSMENT W GRAM STAIN, RFLX TO RESP C

## 2023-10-06 LAB — BASIC METABOLIC PANEL WITH GFR
Anion gap: 8 (ref 5–15)
BUN: 26 mg/dL — ABNORMAL HIGH (ref 8–23)
CO2: 23 mmol/L (ref 22–32)
Calcium: 9.8 mg/dL (ref 8.9–10.3)
Chloride: 97 mmol/L — ABNORMAL LOW (ref 98–111)
Creatinine, Ser: 1.06 mg/dL — ABNORMAL HIGH (ref 0.44–1.00)
GFR, Estimated: 54 mL/min — ABNORMAL LOW (ref >60)
Glucose, Bld: 144 mg/dL — ABNORMAL HIGH (ref 70–99)
Potassium: 4.4 mmol/L (ref 3.5–5.1)
Sodium: 128 mmol/L — ABNORMAL LOW (ref 135–145)

## 2023-10-06 MED ORDER — IPRATROPIUM BROMIDE 0.02 % IN SOLN
RESPIRATORY_TRACT | Status: AC
  Filled 2023-10-06: qty 2.5

## 2023-10-06 MED ORDER — LEVALBUTEROL HCL 0.63 MG/3ML IN NEBU
0.6300 mg | INHALATION_SOLUTION | Freq: Four times a day (QID) | RESPIRATORY_TRACT | Status: DC
  Administered 2023-10-06 – 2023-10-07 (×5): 0.63 mg via RESPIRATORY_TRACT
  Filled 2023-10-06 (×6): qty 3

## 2023-10-06 MED ORDER — IPRATROPIUM BROMIDE 0.02 % IN SOLN
0.5000 mg | Freq: Four times a day (QID) | RESPIRATORY_TRACT | Status: DC
  Administered 2023-10-06 – 2023-10-07 (×5): 0.5 mg via RESPIRATORY_TRACT
  Filled 2023-10-06 (×6): qty 2.5

## 2023-10-06 MED ORDER — FUROSEMIDE 10 MG/ML IJ SOLN
20.0000 mg | Freq: Once | INTRAMUSCULAR | Status: AC
  Administered 2023-10-06: 20 mg via INTRAVENOUS
  Filled 2023-10-06: qty 2

## 2023-10-06 MED ORDER — LORAZEPAM 2 MG/ML IJ SOLN: 0.5000 mg | Freq: Four times a day (QID) | INTRAMUSCULAR | Status: DC | PRN

## 2023-10-06 MED ORDER — GUAIFENESIN-DM 100-10 MG/5ML PO SYRP
10.0000 mL | ORAL_SOLUTION | ORAL | Status: DC | PRN
  Administered 2023-10-06: 10 mL via ORAL
  Filled 2023-10-06: qty 10

## 2023-10-06 NOTE — Progress Notes (Signed)
  Echocardiogram 2D Echocardiogram has been performed.  Leah Mata 10/06/2023, 10:58 AM

## 2023-10-06 NOTE — Progress Notes (Signed)
 [facility]                                                                               Leah Mata, is a 78 y.o. female, DOB - 02-May-1945, FMW:[id number] Admit date - 10/05/2023    Outpatient Primary MD for the patient is Norleen Lynwood ORN, MD  LOS - 0  days    Brief summary   Leah Mata is a 78 y.o. female with medical history significant of COPD, hypertension. Atrial fib on eliquis , hyperlipidemia, AAA with ruptured abdominal aortic aneurysm with surgery on 04/29/2018, admitted for dyspnea for 2 to 3 weeks associated with cough   She underwent CT angio of the chest showing pulmonary nodules and no PE.  She was referred to [facility] for admission for acute copd exacerbation.    Assessment & Plan    Assessment and Plan:    Acute copd exacerbation.  Started her on IV solumedrol 60 mg daily,  Respiratory panel is negative. Continue IV steroids for another 24 hours.  CXR is negative. CT angio a week ago negative for PE.  Add on tessalon  pearls along with robitussin.  Check for sputum cultures.  Her oxygen sats optimal on RA, check ambulating oxygen levels.      Elevated pro bnp Check echocardiogram as she reports persistent dyspnea for more than 2 weeks despite steroid course.  No pedal edema.  One dose of IV lasix  20 mg and check urine output .        Hypertension Well controlled.      Atrial fibrillation Rate controlled with metoprolol  and on eliquis  for anticoagulation.      Hyperlipidemia Resume statin.      Mild hyponatremia Suspect from fluid overload. One dose of IV lasix  ordered. Recheck sodium in am. If no improvement check for SIADH.  Monitor.   Estimated body mass index is 32.62 kg/m as calculated from the following:   Height as of this encounter: 5' 4 (1.626 m).   Weight as of this encounter: 86.2 kg.  Code Status: full code.  DVT Prophylaxis:  SCDs Start: 10/05/23 1729 apixaban  (ELIQUIS ) tablet 5 mg   Level of Care: Level of care:  Med-Surg Family Communication: none at bedside   Disposition Plan:     Remains inpatient appropriate:  pending clinical improvement.   Procedures:  echo  Consultants:   None.   Antimicrobials:   Anti-infectives (From admission, onward)    Start     Dose/Rate Route Frequency Ordered Stop   10/05/23 0000  doxycycline  (VIBRAMYCIN ) 100 MG capsule  Status:  Discontinued        100 mg Oral 2 times daily 10/05/23 0950 10/05/23         Medications  Scheduled Meds:  amLODipine   10 mg Oral Daily   apixaban   5 mg Oral BID   citalopram   10 mg Oral Daily   ipratropium  0.5 mg Nebulization QID   ipratropium-albuterol   3 mL Nebulization Once   irbesartan   300 mg Oral Daily   levalbuterol   0.63 mg Nebulization QID   methylPREDNISolone  (SOLU-MEDROL ) injection  60 mg Intravenous Daily   metoprolol  tartrate  50 mg Oral  BID   pravastatin   20 mg Oral Daily   Continuous Infusions: PRN Meds:.ALPRAZolam , benzonatate , guaiFENesin -dextromethorphan , ipratropium, levalbuterol     Subjective:   Leah Mata was seen and examined today.  Slight improvement IN HER BREATHING.   Objective:   Vitals:   10/06/23 0949 10/06/23 1231 10/06/23 1325 10/06/23 1512  BP:   (!) 145/61   Pulse: (!) 58 (!) 50 (!) 57   Resp: 20 19 16 16   Temp:   98.3 F (36.8 C)   TempSrc:   Oral   SpO2: 98% 98% 94% 97%  Weight:      Height:        Intake/Output Summary (Last 24 hours) at 10/06/2023 1543 Last data filed at 10/06/2023 1100 Gross per 24 hour  Intake 660 ml  Output 500 ml  Net 160 ml   Filed Weights   10/05/23 0713 10/05/23 1400  Weight: 87.4 kg 86.2 kg     Exam General exam: Appears calm and comfortable  Respiratory system: Clear to auscultation. Respiratory effort normal. Cardiovascular system: S1 & S2 heard, RRR. No JVD, Gastrointestinal system: Abdomen is nondistended, soft and nontender.  Central nervous system: Alert and oriented. No focal neurological deficits. Extremities: Symmetric  5 x 5 power. Skin: No rashes,  Psychiatry:  Mood & affect appropriate.     Data Reviewed:  I have personally reviewed following labs and imaging studies   CBC Lab Results  Component Value Date   WBC 11.0 (H) 10/06/2023   RBC 3.72 (L) 10/06/2023   HGB 12.0 10/06/2023   HCT 36.7 10/06/2023   MCV 98.7 10/06/2023   MCH 32.3 10/06/2023   PLT 187 10/06/2023   MCHC 32.7 10/06/2023   RDW 13.0 10/06/2023   LYMPHSABS 1.0 10/06/2023   MONOABS 1.1 (H) 10/06/2023   EOSABS 0.0 10/06/2023   BASOSABS 0.0 10/06/2023     Last metabolic panel Lab Results  Component Value Date   NA 128 (L) 10/06/2023   K 4.4 10/06/2023   CL 97 (L) 10/06/2023   CO2 23 10/06/2023   BUN 26 (H) 10/06/2023   CREATININE 1.06 (H) 10/06/2023   GLUCOSE 144 (H) 10/06/2023   GFRNONAA 54 (L) 10/06/2023   GFRAA 65 08/02/2018   CALCIUM  9.8 10/06/2023   PHOS 2.7 05/30/2018   PROT 7.7 10/05/2023   ALBUMIN  4.2 10/05/2023   BILITOT 0.6 10/05/2023   ALKPHOS 88 10/05/2023   AST 17 10/05/2023   ALT 18 10/05/2023   ANIONGAP 8 10/06/2023    CBG (last 3)  No results for input(s): GLUCAP in the last 72 hours.    Coagulation Profile: No results for input(s): INR, PROTIME in the last 168 hours.   Radiology Studies: DG Chest Port 1 View Result Date: 10/05/2023 CLINICAL DATA:  Shortness of breath. EXAM: PORTABLE CHEST 1 VIEW COMPARISON:  09/27/2023. FINDINGS: The heart size and mediastinal contours are unchanged. Aortic atherosclerosis. No focal consolidation, pleural effusion, or pneumothorax. No acute osseous abnormality. IMPRESSION: No acute cardiopulmonary findings. Electronically Signed   By: Harrietta Sherry M.D.   On: 10/05/2023 08:46       Elgie Butter M.D. [facility] 10/06/2023, 3:43 PM  Available via Epic secure chat 7am-7pm After 7 pm, please refer to night coverage provider listed on amion.

## 2023-10-07 ENCOUNTER — Ambulatory Visit: Admitting: Internal Medicine

## 2023-10-07 DIAGNOSIS — J441 Chronic obstructive pulmonary disease with (acute) exacerbation: Secondary | ICD-10-CM | POA: Diagnosis not present

## 2023-10-07 DIAGNOSIS — I480 Paroxysmal atrial fibrillation: Secondary | ICD-10-CM | POA: Diagnosis not present

## 2023-10-07 DIAGNOSIS — I10 Essential (primary) hypertension: Secondary | ICD-10-CM | POA: Diagnosis not present

## 2023-10-07 LAB — BASIC METABOLIC PANEL WITH GFR
Anion gap: 11 (ref 5–15)
BUN: 37 mg/dL — ABNORMAL HIGH (ref 8–23)
CO2: 23 mmol/L (ref 22–32)
Calcium: 10.4 mg/dL — ABNORMAL HIGH (ref 8.9–10.3)
Chloride: 95 mmol/L — ABNORMAL LOW (ref 98–111)
Creatinine, Ser: 1.09 mg/dL — ABNORMAL HIGH (ref 0.44–1.00)
GFR, Estimated: 53 mL/min — ABNORMAL LOW (ref >60)
Glucose, Bld: 265 mg/dL — ABNORMAL HIGH (ref 70–99)
Potassium: 4.8 mmol/L (ref 3.5–5.1)
Sodium: 129 mmol/L — ABNORMAL LOW (ref 135–145)

## 2023-10-07 LAB — ECHOCARDIOGRAM COMPLETE
Area-P 1/2: 3.53 cm2
Calc EF: 72.2 %
Height: 64 in
S' Lateral: 3.1 cm
Single Plane A2C EF: 71.8 %
Single Plane A4C EF: 72.9 %
Weight: 3040.58 [oz_av]

## 2023-10-07 MED ORDER — PREDNISONE 20 MG PO TABS: ORAL_TABLET | ORAL | 0 refills | Status: DC

## 2023-10-07 MED ORDER — FUROSEMIDE 20 MG PO TABS
20.0000 mg | ORAL_TABLET | Freq: Every day | ORAL | 0 refills | Status: DC
  Filled 2023-10-07: qty 30, 30d supply, fill #0

## 2023-10-07 MED ORDER — IPRATROPIUM BROMIDE 0.02 % IN SOLN
0.5000 mg | Freq: Four times a day (QID) | RESPIRATORY_TRACT | 12 refills | Status: DC | PRN
  Filled 2023-10-07: qty 75, 8d supply, fill #0

## 2023-10-07 MED ORDER — BENZONATATE 200 MG PO CAPS
200.0000 mg | ORAL_CAPSULE | Freq: Three times a day (TID) | ORAL | 0 refills | Status: DC | PRN
  Filled 2023-10-07: qty 20, 7d supply, fill #0

## 2023-10-07 MED ORDER — FUROSEMIDE 20 MG PO TABS: 20.0000 mg | ORAL_TABLET | Freq: Every day | ORAL | Status: DC

## 2023-10-07 MED ORDER — FUROSEMIDE 20 MG PO TABS
10.0000 mg | ORAL_TABLET | Freq: Every day | ORAL | 0 refills | Status: DC
  Filled 2023-10-07: qty 30, 60d supply, fill #0

## 2023-10-07 MED ORDER — FUROSEMIDE 20 MG PO TABS: 10.0000 mg | ORAL_TABLET | Freq: Every day | ORAL | 0 refills | Status: DC

## 2023-10-07 MED ORDER — PREDNISONE 20 MG PO TABS
ORAL_TABLET | ORAL | 0 refills | Status: DC
  Filled 2023-10-07: qty 18, fill #0

## 2023-10-07 MED ORDER — LEVALBUTEROL HCL 0.63 MG/3ML IN NEBU: 0.6300 mg | INHALATION_SOLUTION | Freq: Four times a day (QID) | RESPIRATORY_TRACT | 12 refills | Status: DC | PRN

## 2023-10-07 MED ORDER — LEVALBUTEROL HCL 0.63 MG/3ML IN NEBU
0.6300 mg | INHALATION_SOLUTION | Freq: Four times a day (QID) | RESPIRATORY_TRACT | 12 refills | Status: DC | PRN
  Filled 2023-10-07: qty 3, 1d supply, fill #0

## 2023-10-07 MED ORDER — GUAIFENESIN-DM 100-10 MG/5ML PO SYRP: 10.0000 mL | ORAL_SOLUTION | ORAL | 0 refills | Status: DC | PRN

## 2023-10-07 MED ORDER — IPRATROPIUM BROMIDE 0.02 % IN SOLN: 0.5000 mg | Freq: Four times a day (QID) | RESPIRATORY_TRACT | 12 refills | Status: DC | PRN

## 2023-10-07 MED ORDER — BENZONATATE 200 MG PO CAPS: 200.0000 mg | ORAL_CAPSULE | Freq: Three times a day (TID) | ORAL | 0 refills | Status: AC | PRN

## 2023-10-07 MED ORDER — GUAIFENESIN-DM 100-10 MG/5ML PO SYRP
10.0000 mL | ORAL_SOLUTION | ORAL | 0 refills | Status: DC | PRN
  Filled 2023-10-07: qty 118, 2d supply, fill #0

## 2023-10-07 NOTE — Progress Notes (Signed)
SATURATION QUALIFICATIONS: (This note is used to comply with regulatory documentation for home oxygen)  Patient Saturations on Room Air at Rest = 98%  Patient Saturations on Room Air while Ambulating = 92%   

## 2023-10-07 NOTE — Discharge Summary (Signed)
 Physician Discharge Summary   Patient: Leah Mata MRN: [id number] DOB: November 10, 1945  Admit date:     10/05/2023  Discharge date: 10/07/23  Discharge Physician: Elgie Butter   PCP: Norleen Lynwood ORN, MD   Recommendations at discharge:  Please follow up with cardiology in one week.  Please follow up with pulmonology in one week.  Please follow up with PCP in one week.   Discharge Diagnoses: Principal Problem:   COPD with acute exacerbation [facility])    Hospital Course:  Leah Mata is a 78 y.o. female with medical history significant of COPD, hypertension. 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  She was referred to [facility] for admission for acute copd exacerbation.   Assessment and Plan:   Acute copd exacerbation.  Started her on IV solumedrol 60 mg daily, transition to prednisone  taper on discharge along with levalbuterol  and atrovent  nebs.  CXR is negative. CT angio a week ago negative for PE.  Add on tessalon  pearls along with robitussin.  Sputum cultures are pending. Recommend follow up on the sputum cultures on discharge.  Her oxygen sats optimal on RA, check ambulating oxygen levels.      Elevated pro bnp/ chronic diastolic CHF.  Check echocardiogram as she reports persistent dyspnea for more than 2 weeks despite steroid course.  Echocardiogram shows Left ventricular ejection fraction, by estimation, is 65 to 70%. The  left ventricle has normal function. The left ventricle has no regional  wall motion abnormalities. Left ventricular diastolic parameters are consistent with Grade II diastolic  dysfunction (pseudonormalization).  One dose of IV lasix  20 mg and  urine output in the last 24 hours was 2400 ml.  Will discharge her on low dose lasix  10 mg daily.  Recommend outpatient  follow up with cardiology in one week.        Hypertension Well controlled.      Atrial fibrillation Rate controlled with metoprolol  and on eliquis  for anticoagulation.      Hyperlipidemia Resume statin.      Mild hyponatremia Suspect from fluid overload. Sodium has improved from 128 to 129. She remains asymptomatic.  Recommend checking labs in one week.     Estimated body mass index is 32.62 kg/m as calculated from the following:   Height as of this encounter: 5' 4 (1.626 m).   Weight as of this encounter: 86.2 kg.     Consultants: none.  Procedures performed: echo  Disposition: Home Diet recommendation:  Discharge Diet Orders (From admission, onward)     Start     Ordered   10/07/23 0000  Diet - low sodium heart healthy        10/07/23 1747           Regular diet DISCHARGE MEDICATION: Allergies as of 10/07/2023       Reactions   Hydrochlorothiazide Other (See Comments)   Reaction not known        Medication List     STOP taking these medications    doxycycline  100 MG capsule Commonly known as: VIBRAMYCIN        TAKE these medications    acetaminophen  500 MG tablet Commonly known as: TYLENOL  Take 1,000 mg by mouth every 6 (six) hours as needed for mild pain.   albuterol  108 (90 Base) MCG/ACT  inhaler Commonly known as: VENTOLIN  HFA TAKE 2 PUFFS BY MOUTH EVERY 6 HOURS AS NEEDED FOR WHEEZE OR SHORTNESS OF BREATH What changed:  See the new instructions. Another medication with the same name was removed. Continue taking this medication, and follow the directions you see here.   ALPRAZolam  0.25 MG tablet Commonly known as: XANAX  TAKE 1 TABLET BY MOUTH TWICE A DAY AS NEEDED FOR ANXIETY What changed: See the new instructions.   amLODipine  10 MG tablet Commonly known as: NORVASC  TAKE 1 TABLET EVERY DAY   apixaban  5 MG Tabs tablet Commonly known as: ELIQUIS  Take 1 tablet (5 mg total) by mouth 2 (two) times daily.   benzonatate  200 MG  capsule Commonly known as: TESSALON  Take 1 capsule (200 mg total) by mouth 3 (three) times daily as needed for cough.   cholecalciferol  25 MCG (1000 UNIT) tablet Commonly known as: VITAMIN D3 Take 500 Units by mouth daily.   citalopram  10 MG tablet Commonly known as: CELEXA  Take 1 tablet (10 mg total) by mouth daily.   furosemide  20 MG tablet Commonly known as: LASIX  Take 0.5 tablets (10 mg total) by mouth daily. Start taking on: October 08, 2023   guaiFENesin -dextromethorphan  100-10 MG/5ML syrup Commonly known as: ROBITUSSIN DM Take 10 mLs by mouth every 4 (four) hours as needed (cough not improved with tessalon ).   ipratropium 0.02 % nebulizer solution Commonly known as: ATROVENT  Take 2.5 mLs (0.5 mg total) by nebulization every 6 (six) hours as needed for wheezing.   irbesartan  300 MG tablet Commonly known as: AVAPRO  Take 1 tablet (300 mg total) by mouth daily. What changed: when to take this   levalbuterol  0.63 MG/3ML nebulizer solution Commonly known as: XOPENEX  Take 3 mLs (0.63 mg total) by nebulization every 6 (six) hours as needed for shortness of breath.   levalbuterol  45 MCG/ACT inhaler Commonly known as: XOPENEX  HFA Inhale 2 puffs into the lungs 4 (four) times daily.   lisinopril  20 MG tablet Commonly known as: ZESTRIL  Take 40 mg by mouth daily.   metoprolol  tartrate 50 MG tablet Commonly known as: LOPRESSOR  TAKE 1 TABLET TWICE DAILY   multivitamin tablet Take 1 tablet by mouth daily.   pravastatin  20 MG tablet Commonly known as: PRAVACHOL  Take 1 tablet (20 mg total) by mouth daily.   predniSONE  20 MG tablet Commonly known as: DELTASONE  Prednisone  40 mg daily for 3 days followed by  Prednisone  30 mg daily for 3 days followed by Prednisone  20 mg daily for 3 days followed by  Prednisone  10 mg daily for 3 days. What changed:  medication strength additional instructions Another medication with the same name was removed. Continue taking this  medication, and follow the directions you see here.   True Metrix Air Glucose Meter w/Device Kit Use as directed four times per day        Follow-up Information     Darlean Ozell NOVAK, MD. Call in 1 week.   Specialty: Pulmonary Disease Contact information: [address] [place] [address] [phone]         Norleen Lynwood ORN, MD. Schedule an appointment as soon as possible for a visit in 1 week(s).   Specialties: Internal Medicine, Radiology Contact information: [address] [place] [address] [phone]         Lonni Slain, MD. Schedule an appointment as soon as possible for a visit in 1 week(s).   Specialty: Cardiology Why: for diastolic CHF. Contact information: [address] [place]  [place] [address] [phone]                Discharge Exam: Filed Weights   10/05/23 0713 10/05/23 1400  Weight: 87.4 kg 86.2 kg   General exam: Appears calm and comfortable  Respiratory system: Clear to auscultation. Respiratory effort normal. Cardiovascular system: S1 & S2 heard, RRR. No JVD,  Gastrointestinal system: Abdomen is nondistended, soft and nontender.  Central nervous system: Alert and oriented. No focal neurological deficits. Extremities: Symmetric 5 x 5 power. Skin: No rashes,  Psychiatry:  Mood & affect appropriate.    Condition at discharge: fair  The results of significant diagnostics from this hospitalization (including imaging, microbiology, ancillary and laboratory) are listed below for reference.   Imaging Studies: ECHOCARDIOGRAM COMPLETE Result Date: 10/07/2023    ECHOCARDIOGRAM REPORT   Patient Name:   Leah Mata Date of Exam: 10/06/2023 Medical Rec #:  [id number]     Height:       64.0 in Accession #:    [id number]    Weight:       190.0 lb Date of Birth:  Nov 10, 1945      BSA:          1.914 m Patient Age:    78 years      BP:           103/69 mmHg Patient Gender: F             HR:           75 bpm. Exam  Location:  Inpatient Procedure: 2D Echo, Cardiac Doppler and Color Doppler (Both Spectral and Color            Flow Doppler were utilized during procedure). Indications:     R06.02 SOB  History:         Patient has prior history of Echocardiogram examinations, most                  recent 10/27/2021. Abnormal ECG, COPD, Arrythmias:Atrial Flutter,                  Signs/Symptoms:Shortness of Breath and Dyspnea; Risk                  Factors:Dyslipidemia and Former Smoker.  Sonographer:     Ellouise Mose RDCS Referring Phys:  TAUNA ELGIE BUTTER Diagnosing Phys: Toribio Fuel MD  Sonographer Comments: Technically difficult study due to poor echo windows and patient is obese. Image acquisition challenging due to patient body habitus, Image acquisition challenging due to respiratory motion and Image acquisition challenging due to COPD. Doppler artifact due to wheezing. IMPRESSIONS  1. Left ventricular ejection fraction, by estimation, is 65 to 70%. The left ventricle has normal function. The left ventricle has no regional wall motion abnormalities. Left ventricular diastolic parameters are consistent with Grade II diastolic dysfunction (pseudonormalization).  2. Right ventricular systolic function is normal. The right ventricular size is moderately enlarged. There is normal pulmonary artery systolic pressure.  3. The mitral valve is normal in structure. Trivial mitral valve regurgitation. No evidence of mitral stenosis.  4. The aortic valve is tricuspid. There is mild calcification of the aortic valve. Aortic valve regurgitation is not visualized. Aortic valve sclerosis/calcification is present, without any evidence of aortic stenosis. FINDINGS  Left Ventricle: Left ventricular ejection fraction, by estimation, is 65 to 70%. The left ventricle has normal function. The left ventricle has no regional wall motion abnormalities. The left ventricular internal cavity size was normal in  size. There is  no left ventricular  hypertrophy. Left ventricular diastolic parameters are consistent with Grade II diastolic dysfunction (pseudonormalization). Right Ventricle: The right ventricular size is moderately enlarged. No increase in right ventricular wall thickness. Right ventricular systolic function is normal. There is normal pulmonary artery systolic pressure. The tricuspid regurgitant velocity is 2.11 m/s, and with an assumed right atrial pressure of 3 mmHg, the estimated right ventricular systolic pressure is 20.8 mmHg. Left Atrium: Left atrial size was normal in size. Right Atrium: Right atrial size was normal in size. Pericardium: There is no evidence of pericardial effusion. Presence of epicardial fat layer. Mitral Valve: The mitral valve is normal in structure. Trivial mitral valve regurgitation. No evidence of mitral valve stenosis. MV peak gradient, 6.6 mmHg. The mean mitral valve gradient is 2.0 mmHg. Tricuspid Valve: The tricuspid valve is normal in structure. Tricuspid valve regurgitation is trivial. No evidence of tricuspid stenosis. Aortic Valve: The aortic valve is tricuspid. There is mild calcification of the aortic valve. Aortic valve regurgitation is not visualized. Aortic valve sclerosis/calcification is present, without any evidence of aortic stenosis. Pulmonic Valve: The pulmonic valve was normal in structure. Pulmonic valve regurgitation is not visualized. No evidence of pulmonic stenosis. Aorta: The aortic root and ascending aorta are structurally normal, with no evidence of dilitation. Venous: The inferior vena cava was not well visualized. IAS/Shunts: No atrial level shunt detected by color flow Doppler.  LEFT VENTRICLE PLAX 2D LVIDd:         4.60 cm     Diastology LVIDs:         3.10 cm     LV e' medial:    7.29 cm/s LV PW:         1.00 cm     LV E/e' medial:  15.9 LV IVS:        0.80 cm     LV e' lateral:   5.87 cm/s                            LV E/e' lateral: 19.8  LV Volumes (MOD) LV vol d, MOD A2C: 46.8 ml LV  vol d, MOD A4C: 51.2 ml LV vol s, MOD A2C: 13.2 ml LV vol s, MOD A4C: 13.9 ml LV SV MOD A2C:     33.6 ml LV SV MOD A4C:     51.2 ml LV SV MOD BP:      36.1 ml RIGHT VENTRICLE             IVC RV S prime:     12.30 cm/s  IVC diam: 2.00 cm TAPSE (M-mode): 1.8 cm LEFT ATRIUM             Index        RIGHT ATRIUM           Index LA diam:        2.90 cm 1.51 cm/m   RA Area:     15.70 cm LA Vol (A2C):   37.1 ml 19.38 ml/m  RA Volume:   38.80 ml  20.27 ml/m LA Vol (A4C):   28.7 ml 14.99 ml/m LA Biplane Vol: 32.6 ml 17.03 ml/m  AORTIC VALVE LVOT Vmax:   100.00 cm/s LVOT Vmean:  68.800 cm/s LVOT VTI:    0.232 m  AORTA Ao Root diam: 3.70 cm Ao Asc diam:  3.40 cm MITRAL VALVE  TRICUSPID VALVE MV Area (PHT): 3.53 cm     TR Peak grad:   17.8 mmHg MV Peak grad:  6.6 mmHg     TR Vmax:        211.00 cm/s MV Mean grad:  2.0 mmHg MV Vmax:       1.28 m/s     SHUNTS MV Vmean:      67.3 cm/s    Systemic VTI: 0.23 m MV Decel Time: 215 msec MV E velocity: 116.00 cm/s MV A velocity: 96.25 cm/s MV E/A ratio:  1.21 Toribio Fuel MD Electronically signed by Toribio Fuel MD Signature Date/Time: 10/06/2023/10:32:52 PM    Final (Updated)    DG Chest Port 1 View Result Date: 10/05/2023 CLINICAL DATA:  Shortness of breath. EXAM: PORTABLE CHEST 1 VIEW COMPARISON:  09/27/2023. FINDINGS: The heart size and mediastinal contours are unchanged. Aortic atherosclerosis. No focal consolidation, pleural effusion, or pneumothorax. No acute osseous abnormality. IMPRESSION: No acute cardiopulmonary findings. Electronically Signed   By: Harrietta Sherry M.D.   On: 10/05/2023 08:46   CT Angio Chest W/Cm &/Or Wo Cm Result Date: 09/28/2023 CLINICAL DATA:  Evaluate for pulmonary embolism, low to intermediate probability, positive D-dimer. EXAM: CT ANGIOGRAPHY CHEST WITH CONTRAST TECHNIQUE: Multidetector CT imaging of the chest was performed using the standard protocol during bolus administration of intravenous contrast. Multiplanar CT  image reconstructions and MIPs were obtained to evaluate the vascular anatomy. RADIATION DOSE REDUCTION: This exam was performed according to the departmental dose-optimization program which includes automated exposure control, adjustment of the mA and/or kV according to patient size and/or use of iterative reconstruction technique. CONTRAST:  75mL OMNIPAQUE  IOHEXOL  350 MG/ML SOLN COMPARISON:  CT angio chest abdomen pelvis October 26, 2021 FINDINGS: Cardiovascular: No suspicious finding to suggest pulmonary embolism. Pulmonary artery and segmental branches are patent. The heart size is normal. Fat attenuation lesion measuring 6.1 x 4.7 cm in inter atrial region may represent interatrial lipomatosis versus a lipomatous mass, previously remeasured 5 x 4.6 cm on prior exam. Atherosclerotic calcifications of aorta and coronary arteries. No pericardial effusion. Mediastinum/Nodes: See above. No suspicious lymphadenopathy. A few subcentimeter right lower paratracheal and subcarinal lymph nodes likely reactive and stable to prior. Lungs/Pleura: Upper lobe predominant mild centrilobular emphysematous. Bronchial wall thickening changes suggestive of medium and large size airway disease. Bibasilar atelectasis. No consolidation or honeycombing. Left lung base subpleural nodule versus small focal atelectasis measuring 7 mm (303/85). No pleural effusion. Upper Abdomen: Punctate calcifications within the liver suggestive of prior granulomatous disease. Atrophic changes of the pancreas. Gallbladder is not visualized. Punctate hypodensity of right hepatic lobe likely a small cyst, too small to characterize. Small hiatal hernia. Musculoskeletal: Multilevel degenerative changes of the spine. IMPRESSION: No suspicious findings to suggest pulmonary embolism. Large fat attenuation structure in interatrial region may represent lipomatous hypertrophy of the interatrial septum versus a lipomatous mass, mildly enlarged to prior. Right left  lung base nodule versus focal subpleural atelectasis measuring 7 mm, new to prior. Non-contrast chest CT at 6-12 months is recommended. If the nodule is stable at time of repeat CT, then future CT at 18-24 months (from today's scan) is considered optional for low-risk patients, but is recommended for high-risk patients. This recommendation follows the consensus statement: Guidelines for Management of Incidental Pulmonary Nodules Detected on CT Images: From the Fleischner Society 2017; Radiology 2017; 284:228-243. Electronically Signed   By: Megan  Zare M.D.   On: 09/28/2023 17:12   DG Chest 2 View Result Date: 09/27/2023 CLINICAL DATA:  Shortness of breath and wheezing for 1 week EXAM: CHEST - 2 VIEW COMPARISON:  Chest x-ray Aug 21, 2023 FINDINGS: The cardiomediastinal silhouette is unchanged in contour. Unchanged calcified atherosclerosis of the aortic arch. Hyperinflated lungs. No focal pulmonary opacity. No pleural effusion or pneumothorax. The visualized upper abdomen is unremarkable. No acute osseous abnormality. IMPRESSION: Hyperinflated lungs. Otherwise, no acute cardiopulmonary abnormality. Electronically Signed   By: Dirk Arrant M.D.   On: 09/27/2023 17:21    Microbiology: Results for orders placed or performed during the hospital encounter of 10/05/23  Resp panel by RT-PCR (RSV, Flu A&B, Covid) Anterior Nasal Swab     Status: None   Collection Time: 10/05/23  8:17 AM   Specimen: Anterior Nasal Swab  Result Value Ref Range Status   SARS Coronavirus 2 by RT PCR NEGATIVE NEGATIVE Final    Comment: (NOTE) SARS-CoV-2 target nucleic acids are NOT DETECTED.  The SARS-CoV-2 RNA is generally detectable in upper respiratory specimens during the acute phase of infection. The lowest concentration of SARS-CoV-2 viral copies this assay can detect is 138 copies/mL. A negative result does not preclude SARS-Cov-2 infection and should not be used as the sole basis for treatment or other patient  management decisions. A negative result may occur with  improper specimen collection/handling, submission of specimen other than nasopharyngeal swab, presence of viral mutation(s) within the areas targeted by this assay, and inadequate number of viral copies(<138 copies/mL). A negative result must be combined with clinical observations, patient history, and epidemiological information. The expected result is Negative.  Fact Sheet for Patients:  BloggerCourse.com  Fact Sheet for Healthcare Providers:  SeriousBroker.it  This test is no t yet approved or cleared by the [place]  FDA and  has been authorized for detection and/or diagnosis of SARS-CoV-2 by FDA under an Emergency Use Authorization (EUA). This EUA will remain  in effect (meaning this test can be used) for the duration of the COVID-19 declaration under Section 564(b)(1) of the Act, 21 U.S.C.section 360bbb-3(b)(1), unless the authorization is terminated  or revoked sooner.       Influenza A by PCR NEGATIVE NEGATIVE Final   Influenza B by PCR NEGATIVE NEGATIVE Final    Comment: (NOTE) The Xpert Xpress SARS-CoV-2/FLU/RSV plus assay is intended as an aid in the diagnosis of influenza from Nasopharyngeal swab specimens and should not be used as a sole basis for treatment. Nasal washings and aspirates are unacceptable for Xpert Xpress SARS-CoV-2/FLU/RSV testing.  Fact Sheet for Patients: BloggerCourse.com  Fact Sheet for Healthcare Providers: SeriousBroker.it  This test is not yet approved or cleared by the [place]  FDA and has been authorized for detection and/or diagnosis of SARS-CoV-2 by FDA under an Emergency Use Authorization (EUA). This EUA will remain in effect (meaning this test can be used) for the duration of the COVID-19 declaration under Section 564(b)(1) of the Act, 21 U.S.C. section 360bbb-3(b)(1),  unless the authorization is terminated or revoked.     Resp Syncytial Virus by PCR NEGATIVE NEGATIVE Final    Comment: (NOTE) Fact Sheet for Patients: BloggerCourse.com  Fact Sheet for Healthcare Providers: SeriousBroker.it  This test is not yet approved or cleared by the [place]  FDA and has been authorized for detection and/or diagnosis of SARS-CoV-2 by FDA under an Emergency Use Authorization (EUA). This EUA will remain in effect (meaning this test can be used) for the duration of the COVID-19 declaration under Section 564(b)(1) of the Act, 21 U.S.C. section 360bbb-3(b)(1), unless the authorization is terminated or revoked.  Performed at [facility], [address] [place], [place] [address]   Expectorated Sputum Assessment w Gram Stain, Rflx to Resp Cult     Status: None   Collection Time: 10/06/23 11:13 AM   Specimen: Sputum  Result Value Ref Range Status   Specimen Description SPUTUM  Final   Special Requests NONE  Final   Sputum evaluation   Final    THIS SPECIMEN IS ACCEPTABLE FOR SPUTUM CULTURE Performed at [facility], [address], [place], [place] [address]    Report Status 10/06/2023 FINAL  Final  Culture, Respiratory w Gram Stain     Status: None (Preliminary result)   Collection Time: 10/06/23 11:13 AM   Specimen: SPU  Result Value Ref Range Status   Specimen Description   Final    SPUTUM Performed at [facility], [address] [place], [place] [address]    Special Requests   Final    NONE Reflexed from [id number] Performed at [facility], [address] [place], [place] [address]    Gram Stain   Final    RARE WBC PRESENT,BOTH PMN AND MONONUCLEAR RARE GRAM POSITIVE COCCI IN PAIRS IN CHAINS RARE BUDDING YEAST SEEN    Culture   Final    CULTURE REINCUBATED FOR BETTER GROWTH Performed at [facility], [address], [place], [place] [address]    Report Status PENDING  Incomplete    Labs: CBC: Recent Labs  Lab 10/05/23 0818 10/05/23 0819 10/06/23 0544  WBC 9.7  --  11.0*  NEUTROABS 6.6  --  8.7*  HGB 13.1 13.3 12.0  HCT 39.7 39.0 36.7  MCV 95.0  --  98.7  PLT 219  --  187   Basic Metabolic Panel: Recent Labs  Lab 10/05/23 0818 10/05/23 0819 10/06/23 0544 10/07/23 1343  NA 133* 132* 128* 129*  K 4.1 3.9 4.4 4.8  CL 97*  --  97* 95*  CO2 23  --  23 23  GLUCOSE 139*  --  144* 265*  BUN 16  --  26* 37*  CREATININE 1.02*  --  1.06* 1.09*  CALCIUM  10.7*  --  9.8 10.4*   Liver Function Tests: Recent Labs  Lab 10/05/23 0818  AST 17  ALT 18  ALKPHOS 88  BILITOT 0.6  PROT 7.7  ALBUMIN  4.2   CBG: No results for input(s): GLUCAP in the last 168 hours.  Discharge time spent: 42 minutes.   Signed: Elgie Butter, MD [facility] 10/07/2023

## 2023-10-07 NOTE — Plan of Care (Signed)

## 2023-10-07 NOTE — Evaluation (Addendum)
 Physical Therapy Evaluation Only Patient Details Name: Leah Mata MRN: [id number] DOB: Feb 11, 1946 Today's Date: 10/07/2023  History of Present Illness  Leah Mata is a 78 y.o. female admitted with COPD with acute exacerbation. CXR is negative. CT angio of the chest showing pulmonary nodules and no PE. PMH: COPD, hypertension. Atrial fib on eliquis , hyperlipidemia, AAA with ruptured abdominal aortic aneurysm with surgery on 04/29/2018  Clinical Impression  Pt ind with mobility, no cues or assist. Pt with audible wheezing noted, on RA with SpO2 92% at rest, unable to obtain reading while ambulating but SpO2 reads 87% immediately post ambulation- notified RN. Cued pt on seated pursed lip breathing and improves to 94% with ~3 minutes of rest. Pt reports ind without AD at baseline, reports has home pulse ox that reads >95% typically. Pt reports son and grandson live with her and complete household chores. No acute PT needs identified and no f/u at d/c. Will sign off at this time.      If plan is discharge home, recommend the following:     Can travel by private vehicle        Equipment Recommendations None recommended by PT  Recommendations for Other Services       Functional Status Assessment Patient has not had a recent decline in their functional status     Precautions / Restrictions Precautions Precaution/Restrictions Comments: monitor O2 Restrictions Weight Bearing Restrictions Per Provider Order: No      Mobility  Bed Mobility Overal bed mobility: Independent                  Transfers Overall transfer level: Independent Equipment used: None                    Ambulation/Gait Ambulation/Gait assistance: Independent Gait Distance (Feet): 250 Feet Assistive device: None Gait Pattern/deviations: WFL(Within Functional Limits) Gait velocity: WFL     General Gait Details: step through gait pattern, navigates room setup and around obstacles in hallway, no  LOB or unsteadiness noted, dyspnea noted with audible wheezing, unable to obtain good waveform while amb but 87% immediately after in sitting  Stairs            Wheelchair Mobility     Tilt Bed    Modified Rankin (Stroke Patients Only)       Balance Overall balance assessment: No apparent balance deficits (not formally assessed)                                           Pertinent Vitals/Pain Pain Assessment Pain Assessment: No/denies pain    Home Living Family/patient expects to be discharged to:: Private residence Living Arrangements: Children Available Help at Discharge: Family;Available 24 hours/day Type of Home: Mobile home Home Access: Ramped entrance       Home Layout: One level Home Equipment: Agricultural consultant (2 wheels);BSC/3in1;Shower seat      Prior Function Prior Level of Function : Independent/Modified Independent             Mobility Comments: walks without AD ADLs Comments: pt reports ind with self care, son completes household chores     Extremity/Trunk Assessment   Upper Extremity Assessment Upper Extremity Assessment: Overall WFL for tasks assessed    Lower Extremity Assessment Lower Extremity Assessment: Overall WFL for tasks assessed    Cervical / Trunk Assessment Cervical / Trunk Assessment:  Normal  Communication   Communication Communication: Impaired Factors Affecting Communication: Hearing impaired    Cognition Arousal: Alert Behavior During Therapy: WFL for tasks assessed/performed   PT - Cognitive impairments: No apparent impairments                         Following commands: Intact       Cueing       General Comments General comments (skin integrity, edema, etc.): Pt on RA with SpO2 92% upon arrival, 87% immediately post amb, 94% ~3 min post amb- notified RN    Exercises     Assessment/Plan    PT Assessment Patient does not need any further PT services  PT Problem List          PT Treatment Interventions      PT Goals (Current goals can be found in the Care Plan section)  Acute Rehab PT Goals Patient Stated Goal: return home PT Goal Formulation: All assessment and education complete, DC therapy    Frequency       Co-evaluation               AM-PAC PT 6 Clicks Mobility  Outcome Measure Help needed turning from your back to your side while in a flat bed without using bedrails?: None Help needed moving from lying on your back to sitting on the side of a flat bed without using bedrails?: None Help needed moving to and from a bed to a chair (including a wheelchair)?: None Help needed standing up from a chair using your arms (e.g., wheelchair or bedside chair)?: None Help needed to walk in hospital room?: None Help needed climbing 3-5 steps with a railing? : None 6 Click Score: 24    End of Session Equipment Utilized During Treatment: Gait belt Activity Tolerance: Patient tolerated treatment well Patient left: in bed;with call bell/phone within reach;with bed alarm set Nurse Communication: Mobility status;Other (comment) (SpO2) PT Visit Diagnosis: Other abnormalities of gait and mobility (R26.89)    Time: 8988-8975 PT Time Calculation (min) (ACUTE ONLY): 13 min   Charges:   PT Evaluation $PT Eval Low Complexity: 1 Low   PT General Charges $$ ACUTE PT VISIT: 1 Visit         Tori Stepahnie Campo PT, DPT 10/07/23, 10:32 AM

## 2023-10-07 NOTE — Progress Notes (Signed)
   10/07/23 1551  TOC Brief Assessment  Insurance and Status Reviewed  Patient has primary care physician Yes Vilinda, Lynwood ORN, MD)  Home environment has been reviewed Home  Prior level of function: Independent  Prior/Current Home Services No current home services  Social Drivers of Health Review SDOH reviewed no interventions necessary  Readmission risk has been reviewed Yes  Transition of care needs no transition of care needs at this time

## 2023-10-08 ENCOUNTER — Other Ambulatory Visit (HOSPITAL_COMMUNITY): Payer: Self-pay

## 2023-10-08 LAB — CULTURE, RESPIRATORY W GRAM STAIN: Culture: NORMAL

## 2023-10-10 ENCOUNTER — Telehealth: Payer: Self-pay

## 2023-10-10 NOTE — Transitions of Care (Post Inpatient/ED Visit) (Signed)
   10/10/2023  Name: Leah Mata MRN: [id number] DOB: 06/01/1945  Today's TOC FU Call Status: Today's TOC FU Call Status:: Successful TOC FU Call Completed TOC FU Call Complete Date: 10/10/23 (Spoke with patient who state she already spoke with someone today and said she told them she had no needs and is seeing PCP this week -Declined TOC) Patient's Name and Date of Birth confirmed.  Transition Care Management Follow-up Telephone Call Date of Discharge: 10/07/23 Discharge Facility: [facility]) Type of Discharge: Inpatient Admission Primary Inpatient Discharge Diagnosis:: COPD with acute exacerbation   Shona Prow RN, CCM [place]  VBCI-Population Health RN Care Manager [phone]

## 2023-10-12 ENCOUNTER — Other Ambulatory Visit: Payer: Self-pay

## 2023-10-12 ENCOUNTER — Ambulatory Visit: Admitting: Internal Medicine

## 2023-10-12 ENCOUNTER — Other Ambulatory Visit (HOSPITAL_COMMUNITY): Payer: Self-pay

## 2023-10-12 ENCOUNTER — Encounter: Payer: Self-pay | Admitting: Internal Medicine

## 2023-10-12 VITALS — BP 136/70 | HR 53 | Temp 97.6°F | Ht 64.0 in | Wt 190.0 lb

## 2023-10-12 DIAGNOSIS — F419 Anxiety disorder, unspecified: Secondary | ICD-10-CM | POA: Diagnosis not present

## 2023-10-12 DIAGNOSIS — I10 Essential (primary) hypertension: Secondary | ICD-10-CM | POA: Diagnosis not present

## 2023-10-12 DIAGNOSIS — J449 Chronic obstructive pulmonary disease, unspecified: Secondary | ICD-10-CM | POA: Diagnosis not present

## 2023-10-12 MED ORDER — CLONAZEPAM 0.5 MG PO TABS
0.5000 mg | ORAL_TABLET | Freq: Two times a day (BID) | ORAL | 1 refills | Status: DC | PRN
  Filled 2023-10-12: qty 60, 30d supply, fill #0
  Filled 2024-01-20 – 2024-03-02 (×2): qty 60, 30d supply, fill #1

## 2023-10-12 MED ORDER — FUROSEMIDE 20 MG PO TABS
10.0000 mg | ORAL_TABLET | Freq: Every day | ORAL | 3 refills | Status: DC
  Filled 2023-10-12 (×2): qty 45, 90d supply, fill #0

## 2023-10-12 MED ORDER — IRBESARTAN 300 MG PO TABS
300.0000 mg | ORAL_TABLET | Freq: Every day | ORAL | 3 refills | Status: AC
  Filled 2023-10-12: qty 90, 90d supply, fill #0
  Filled 2024-01-03: qty 90, 90d supply, fill #1
  Filled 2024-04-02: qty 90, 90d supply, fill #2

## 2023-10-12 MED ORDER — IPRATROPIUM BROMIDE 0.02 % IN SOLN
0.5000 mg | Freq: Four times a day (QID) | RESPIRATORY_TRACT | 12 refills | Status: DC | PRN
Start: 2023-10-12 — End: 2023-12-09
  Filled 2023-10-12: qty 75, 8d supply, fill #0
  Filled 2023-11-23: qty 75, 8d supply, fill #1

## 2023-10-12 MED ORDER — AMLODIPINE BESYLATE 10 MG PO TABS
10.0000 mg | ORAL_TABLET | Freq: Every day | ORAL | 3 refills | Status: AC
  Filled 2023-10-12: qty 90, 90d supply, fill #0
  Filled 2024-01-03: qty 90, 90d supply, fill #1
  Filled 2024-04-02: qty 90, 90d supply, fill #2

## 2023-10-12 NOTE — Patient Instructions (Addendum)
 Please continue all other medications as before, and refills have been done as requested. EXCEPT  We did not refill the lisinopril  as you are already taking the generic for Avapro  300 mg per day, which is very similar  We did refills for the avapro , lasix , amlodipine  and ipratropium nebuliser soln   Please take all new medication as prescribed  - the klonopin  as needed  Please have the pharmacy call with any other refills you may need.  Please continue your efforts at being more active, low cholesterol diet, and weight control.  Please keep your appointments with your specialists as you may have planned - Cardiology and Pulmonary soon  Please make an Appointment to return in 6 months, or sooner if needed

## 2023-10-12 NOTE — Assessment & Plan Note (Addendum)
 BP Readings from Last 3 Encounters:  10/12/23 136/70  10/07/23 128/64  09/27/23 110/64   Stable, pt to continue medical treatment lopressor  50 bid, norvasc  10 every day, avapro  300 every day, and lasix  10 mg qd

## 2023-10-12 NOTE — Progress Notes (Signed)
 Patient ID: Leah Mata, female   DOB: 11-Mar-1946, 78 y.o.   MRN: [id number]        Chief Complaint: follow up post hospital July 9 - 11 2025 with copd exacerbation, mild chf with new lasix  10 mg qd       HPI:  Leah Mata is a 78 y.o. female here overall doing quite nicely with midl doe and small non prod cough, but Pt denies chest pain, increased sob or doe, wheezing, orthopnea, PND, increased LE swelling, palpitations, dizziness or syncope. She believes the lasix  10 mg likely helped the breathing as well.  Needs med refills, and has pulm f/u planned soon.  Her only c/o anxiety worsening, ran out of xanax  but willing to consider change.         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   Procedure: ANEURYSM ABDOMINAL AORTIC REPAIR WITH HEMASHIELD GOLD VASCULAR GRAFT 22X11 mm;  Surgeon: Oris Krystal FALCON, MD;  Location: [facility] OR;  Service: Vascular;  Laterality: N/A;   CARDIAC CATHETERIZATION  03/29/2001   negative   DILATION AND CURETTAGE OF UTERUS  1985   TONSILLECTOMY Bilateral    age 88   TUBAL LIGATION  1985   XI ROBOTIC ASSISTED VENTRAL HERNIA N/A 01/22/2022   Procedure: XI ROBOTIC ASSISTED INCISIONAL HERNIA REPAIR WITH MESH;  Surgeon: Lyndel Deward PARAS, MD;  Location: [facility] ORS;  Service: General;  Laterality: N/A;    reports that she quit smoking about 5 years ago. Her smoking use included cigarettes. She started smoking about 35 years ago. She has a 15 pack-year  smoking history. She has never used smokeless tobacco. She reports that she does not drink alcohol and does not use drugs. family history includes Congestive Heart Failure in her mother; Coronary artery disease in an other family member; Diabetes in her father and mother; Heart attack in her father; Hypertension in her mother. Allergies  Allergen Reactions   Hydrochlorothiazide Other (See Comments)    Reaction not known   Current Outpatient Medications on File Prior to Visit  Medication Sig Dispense Refill   acetaminophen  (TYLENOL ) 500 MG tablet Take 1,000 mg by mouth every 6 (six) hours as needed for mild pain.     albuterol  (VENTOLIN  HFA) 108 (90 Base) MCG/ACT inhaler TAKE 2 PUFFS BY MOUTH EVERY 6 HOURS AS NEEDED FOR WHEEZE OR SHORTNESS OF BREATH (Patient taking differently: Inhale 2 puffs into the lungs every 6 (six) hours as needed for wheezing or shortness of breath.) 8.5 each 0   apixaban  (ELIQUIS ) 5 MG TABS tablet Take 1 tablet (5 mg total) by mouth 2 (two) times daily. 180 tablet 3   benzonatate  (TESSALON ) 200 MG capsule Take 1 capsule (200 mg total) by mouth 3 (three) times daily as needed for cough. 20 capsule 0   Blood Glucose Monitoring Suppl (TRUE METRIX AIR GLUCOSE METER) w/Device KIT Use as directed  four times per day 1 kit 3   cholecalciferol  (VITAMIN D3) 25 MCG (1000 UNIT) tablet Take 500 Units by mouth daily.     citalopram  (CELEXA ) 10 MG tablet Take 1 tablet (10 mg total) by mouth daily. 90 tablet 2   guaiFENesin -dextromethorphan  (ROBITUSSIN DM) 100-10 MG/5ML syrup Take 10 mLs by mouth every 4 (four) hours as needed (cough not improved with tessalon ). 118 mL 0   levalbuterol  (XOPENEX  HFA) 45 MCG/ACT inhaler Inhale 2 puffs into the lungs 4 (four) times daily. 1 each 12   levalbuterol  (XOPENEX ) 0.63 MG/3ML nebulizer solution Take 3 mLs (0.63 mg total) by nebulization every 6 (six) hours as needed for shortness of breath. 3 mL 12   metoprolol  tartrate (LOPRESSOR ) 50 MG tablet TAKE  1 TABLET TWICE DAILY 180 tablet 2   Multiple Vitamin (MULTIVITAMIN) tablet Take 1 tablet by mouth daily.     pravastatin  (PRAVACHOL ) 20 MG tablet Take 1 tablet (20 mg total) by mouth daily. 90 tablet 3   predniSONE  (DELTASONE ) 20 MG tablet Prednisone  40 mg daily for 3 days followed by  Prednisone  30 mg daily for 3 days followed by Prednisone  20 mg daily for 3 days followed by  Prednisone  10 mg daily for 3 days. 18 tablet 0   No current facility-administered medications on file prior to visit.        ROS:  All others reviewed and negative.  Objective        PE:  BP 136/70   Pulse (!) 53   Temp 97.6 F (36.4 C)   Ht 5' 4 (1.626 m)   Wt 190 lb (86.2 kg)   LMP 03/29/1993 (Approximate)   SpO2 96%   BMI 32.61 kg/m                 Constitutional: Pt appears in NAD               HENT: Head: NCAT.                Right Ear: External ear normal.                 Left Ear: External ear normal.                Eyes: . Pupils are equal, round, and reactive to light. Conjunctivae and EOM are normal               Nose: without d/c or deformity               Neck: Neck supple. Gross normal ROM               Cardiovascular: Normal rate and regular rhythm.                 Pulmonary/Chest: Effort normal and breath sounds decreased without rales or wheezing.                Abd:  Soft, NT, ND, + BS, no organomegaly               Neurological: Pt is alert. At baseline orientation, motor grossly intact               Skin: Skin is warm. No rashes, no other new lesions, LE edema - none               Psychiatric: Pt behavior is normal without agitation   Micro: none  Cardiac tracings I have personally interpreted today:  none  Pertinent Radiological findings (summarize): none   Lab Results  Component Value Date   WBC 11.0 (H) 10/06/2023   HGB 12.0 10/06/2023   HCT 36.7 10/06/2023   PLT 187 10/06/2023   GLUCOSE 265 (H) 10/07/2023   CHOL 158 09/27/2023   TRIG 225.0 (H) 09/27/2023   HDL 51.40  09/27/2023   LDLDIRECT 79.0 04/20/2021   LDLCALC 62 09/27/2023   ALT 18 10/05/2023   AST 17 10/05/2023   NA 129 (L) 10/07/2023   K 4.8 10/07/2023   CL 95 (L) 10/07/2023   CREATININE 1.09 (H) 10/07/2023   BUN 37 (H) 10/07/2023   CO2 23 10/07/2023   TSH 1.24 09/27/2023   INR 1.16 04/30/2018   HGBA1C 6.4 09/27/2023   Assessment/Plan:  Leah Mata is a 78 y.o. White or Caucasian [1] female with  has a past medical history of BACK PAIN (03/14/2009), COPD (11/01/2006), Dyspnea, HYPERTENSION (11/01/2006), Hypertrophy of tongue papillae (06/05/2009), Other and unspecified hyperlipidemia (08/22/2013), and Pre-diabetes.  COPD GOLD 2 Stable overall, cont ipratropium neb and inhalers, and f/u pulm as planned  Anxiety Mild to mod, for klonopin  0.5 mg bid prn,  to f/u any worsening symptoms or concerns   Essential hypertension BP Readings from Last 3 Encounters:  10/12/23 136/70  10/07/23 128/64  09/27/23 110/64   Stable, pt to continue medical treatment lopressor  50 bid, norvasc  10 every day, avapro  300 every day, and lasix  10 mg qd  Followup: Return in about 6 months (around 04/13/2024).  Lynwood Rush, MD 10/12/2023 1:16 PM [facility]

## 2023-10-12 NOTE — Assessment & Plan Note (Signed)
 Mild to mod, for klonopin  0.5 mg bid prn,  to f/u any worsening symptoms or concerns

## 2023-10-12 NOTE — Assessment & Plan Note (Signed)
 Stable overall, cont ipratropium neb and inhalers, and f/u pulm as planned

## 2023-10-18 ENCOUNTER — Ambulatory Visit (HOSPITAL_BASED_OUTPATIENT_CLINIC_OR_DEPARTMENT_OTHER): Admitting: Family

## 2023-10-18 ENCOUNTER — Encounter (HOSPITAL_BASED_OUTPATIENT_CLINIC_OR_DEPARTMENT_OTHER): Payer: Self-pay | Admitting: Family

## 2023-10-18 VITALS — BP 118/54 | HR 54 | Ht 66.0 in | Wt 185.0 lb

## 2023-10-18 DIAGNOSIS — D6859 Other primary thrombophilia: Secondary | ICD-10-CM | POA: Diagnosis not present

## 2023-10-18 DIAGNOSIS — I5032 Chronic diastolic (congestive) heart failure: Secondary | ICD-10-CM

## 2023-10-18 DIAGNOSIS — I480 Paroxysmal atrial fibrillation: Secondary | ICD-10-CM

## 2023-10-18 DIAGNOSIS — I10 Essential (primary) hypertension: Secondary | ICD-10-CM

## 2023-10-18 DIAGNOSIS — E871 Hypo-osmolality and hyponatremia: Secondary | ICD-10-CM

## 2023-10-18 NOTE — Patient Instructions (Addendum)
 Medication Instructions:  Continue current medications.  We will wait on labs to determine whether to leave Lasix  every other day or change to daily.  *If you need a refill on your cardiac medications before your next appointment, please call your pharmacy*  Lab Work: Labs today: ProBNP, BMET  If you have labs (blood work) drawn today and your tests are completely normal, you will receive your results only by: MyChart Message (if you have MyChart) OR A paper copy in the mail If you have any lab test that is abnormal or we need to change your treatment, we will call you to review the results.  Follow-Up: At [facility], you and your health needs are our priority.  As part of our continuing mission to provide you with exceptional heart care, our providers are all part of one team.  This team includes your primary Cardiologist (physician) and Advanced Practice Providers or APPs (Physician Assistants and Nurse Practitioners) who all work together to provide you with the care you need, when you need it.  Your next appointment:   6 month(s)  Provider:   Shelda Bruckner, MD, Rosaline Bane, NP, or Reche Finder, NP    We recommend signing up for the patient portal called MyChart.  Sign up information is provided on this After Visit Summary.  MyChart is used to connect with patients for Virtual Visits (Telemedicine).  Patients are able to view lab/test results, encounter notes, upcoming appointments, etc.  Non-urgent messages can be sent to your provider as well.   To learn more about what you can do with MyChart, go to [email or web address].   Other Instructions Your triglycerides were elevated on last labs, see attached sheet with tips and tricks to help lower triglycerides.       Recommend weighing daily and keeping a log. Please call our office if you have weight gain of 2 pounds overnight or 5 pounds in 1 week.   Date  Time Weight                                             Heart Healthy Diet Recommendations: A low-salt diet is recommended. Meats should be grilled, baked, or boiled. Avoid fried foods. Focus on lean protein sources like fish or chicken with vegetables and fruits. The American Heart Association is a Chief Technology Officer!  American Heart Association Diet and Lifeystyle Recommendations   Exercise recommendations: The American Heart Association recommends 150 minutes of moderate intensity exercise weekly. Try 30 minutes of moderate intensity exercise 4-5 times per week. This could include walking, jogging, or swimming.

## 2023-10-18 NOTE — Progress Notes (Signed)
 Cardiology Office Note   Date:  10/24/2023  ID:  Leah Mata, DOB 02-Apr-1945, MRN [id number] PCP: Norleen Lynwood ORN, MD  [facility] Providers Cardiologist:  Shelda Bruckner, MD     History of Present Illness  Discussed the use of AI scribe software for clinical note transcription with the patient, who gave verbal consent to proceed.  History of Present Illness Leah Mata is a 78 year old female with COPD, HTN, atrial fibrillation, HLD, AAA s/p rupture and surgical repair 04/2018, HFpEF  Presents for follow-up after hospitalization earlier this month for COPD exacerbation and acute diastolic heart failure with elevated BNP. Echo LVEF 65-70%, no RWMA, gr2dd. Given IV Lasix  and discharged on Lasix10mg  daily.   Her breathing has improved since discharge, with occasional 'rattling' that responds well to nebulizer treatments, which she finds more effective than inhalers. She continues to use nebulizers at home.  She takes Lasix  only every other day due to misunderstanding, with a stable weight. Home monitoring of blood pressure and oxygen levels shows satisfactory results.  No issues with Eliquis , such as hematuria, melena, or excessive bruising. IV sites have healed well post-hospitalization.  ROS: Please see the history of present illness.    All other systems reviewed and are negative.   Studies Reviewed      Cardiac Studies & Procedures   ______________________________________________________________________________________________     ECHOCARDIOGRAM  ECHOCARDIOGRAM COMPLETE 10/06/2023  Narrative ECHOCARDIOGRAM REPORT    Patient Name:   Leah Mata Date of Exam: 10/06/2023 Medical Rec #:  [id number]     Height:       64.0 in Accession #:    [id number]    Weight:       190.0 lb Date of Birth:  October 02, 1945      BSA:          1.914 m Patient Age:    78 years      BP:           103/69 mmHg Patient Gender: F             HR:           75 bpm. Exam Location:   Inpatient  Procedure: 2D Echo, Cardiac Doppler and Color Doppler (Both Spectral and Color Flow Doppler were utilized during procedure).  Indications:     R06.02 SOB  History:         Patient has prior history of Echocardiogram examinations, most recent 10/27/2021. Abnormal ECG, COPD, Arrythmias:Atrial Flutter, Signs/Symptoms:Shortness of Breath and Dyspnea; Risk Factors:Dyslipidemia and Former Smoker.  Sonographer:     Ellouise Mose RDCS Referring Phys:  TAUNA ELGIE BUTTER Diagnosing Phys: Toribio Fuel MD   Sonographer Comments: Technically difficult study due to poor echo windows and patient is obese. Image acquisition challenging due to patient body habitus, Image acquisition challenging due to respiratory motion and Image acquisition challenging due to COPD. Doppler artifact due to wheezing. IMPRESSIONS   1. Left ventricular ejection fraction, by estimation, is 65 to 70%. The left ventricle has normal function. The left ventricle has no regional wall motion abnormalities. Left ventricular diastolic parameters are consistent with Grade II diastolic dysfunction (pseudonormalization). 2. Right ventricular systolic function is normal. The right ventricular size is moderately enlarged. There is normal pulmonary artery systolic pressure. 3. The mitral valve is normal in structure. Trivial mitral valve regurgitation. No evidence of mitral stenosis. 4. The aortic valve is tricuspid. There is mild calcification of the aortic valve. Aortic valve regurgitation is not  visualized. Aortic valve sclerosis/calcification is present, without any evidence of aortic stenosis.  FINDINGS Left Ventricle: Left ventricular ejection fraction, by estimation, is 65 to 70%. The left ventricle has normal function. The left ventricle has no regional wall motion abnormalities. The left ventricular internal cavity size was normal in size. There is no left ventricular hypertrophy. Left ventricular diastolic parameters  are consistent with Grade II diastolic dysfunction (pseudonormalization).  Right Ventricle: The right ventricular size is moderately enlarged. No increase in right ventricular wall thickness. Right ventricular systolic function is normal. There is normal pulmonary artery systolic pressure. The tricuspid regurgitant velocity is 2.11 m/s, and with an assumed right atrial pressure of 3 mmHg, the estimated right ventricular systolic pressure is 20.8 mmHg.  Left Atrium: Left atrial size was normal in size.  Right Atrium: Right atrial size was normal in size.  Pericardium: There is no evidence of pericardial effusion. Presence of epicardial fat layer.  Mitral Valve: The mitral valve is normal in structure. Trivial mitral valve regurgitation. No evidence of mitral valve stenosis. MV peak gradient, 6.6 mmHg. The mean mitral valve gradient is 2.0 mmHg.  Tricuspid Valve: The tricuspid valve is normal in structure. Tricuspid valve regurgitation is trivial. No evidence of tricuspid stenosis.  Aortic Valve: The aortic valve is tricuspid. There is mild calcification of the aortic valve. Aortic valve regurgitation is not visualized. Aortic valve sclerosis/calcification is present, without any evidence of aortic stenosis.  Pulmonic Valve: The pulmonic valve was normal in structure. Pulmonic valve regurgitation is not visualized. No evidence of pulmonic stenosis.  Aorta: The aortic root and ascending aorta are structurally normal, with no evidence of dilitation.  Venous: The inferior vena cava was not well visualized.  IAS/Shunts: No atrial level shunt detected by color flow Doppler.   LEFT VENTRICLE PLAX 2D LVIDd:         4.60 cm     Diastology LVIDs:         3.10 cm     LV e' medial:    7.29 cm/s LV PW:         1.00 cm     LV E/e' medial:  15.9 LV IVS:        0.80 cm     LV e' lateral:   5.87 cm/s LV E/e' lateral: 19.8  LV Volumes (MOD) LV vol d, MOD A2C: 46.8 ml LV vol d, MOD A4C: 51.2 ml LV  vol s, MOD A2C: 13.2 ml LV vol s, MOD A4C: 13.9 ml LV SV MOD A2C:     33.6 ml LV SV MOD A4C:     51.2 ml LV SV MOD BP:      36.1 ml  RIGHT VENTRICLE             IVC RV S prime:     12.30 cm/s  IVC diam: 2.00 cm TAPSE (M-mode): 1.8 cm  LEFT ATRIUM             Index        RIGHT ATRIUM           Index LA diam:        2.90 cm 1.51 cm/m   RA Area:     15.70 cm LA Vol (A2C):   37.1 ml 19.38 ml/m  RA Volume:   38.80 ml  20.27 ml/m LA Vol (A4C):   28.7 ml 14.99 ml/m LA Biplane Vol: 32.6 ml 17.03 ml/m AORTIC VALVE LVOT Vmax:   100.00 cm/s LVOT Vmean:  68.800  cm/s LVOT VTI:    0.232 m  AORTA Ao Root diam: 3.70 cm Ao Asc diam:  3.40 cm  MITRAL VALVE                TRICUSPID VALVE MV Area (PHT): 3.53 cm     TR Peak grad:   17.8 mmHg MV Peak grad:  6.6 mmHg     TR Vmax:        211.00 cm/s MV Mean grad:  2.0 mmHg MV Vmax:       1.28 m/s     SHUNTS MV Vmean:      67.3 cm/s    Systemic VTI: 0.23 m MV Decel Time: 215 msec MV E velocity: 116.00 cm/s MV A velocity: 96.25 cm/s MV E/A ratio:  1.21  Toribio Fuel MD Electronically signed by Toribio Fuel MD Signature Date/Time: 10/06/2023/10:32:52 PM    Final (Updated)          ______________________________________________________________________________________________      Risk Assessment/Calculations  CHA2DS2-VASc Score = 6   This indicates a 9.7% annual risk of stroke. The patient's score is based upon: CHF History: 1 HTN History: 1 Diabetes History: 0 Stroke History: 0 Vascular Disease History: 1 Age Score: 2 Gender Score: 1           Physical Exam VS:  BP (!) 118/54 (BP Location: Right Arm, Patient Position: Sitting, Cuff Size: Normal)   Pulse (!) 54   Ht 5' 6 (1.676 m)   Wt 185 lb (83.9 kg)   LMP 03/29/1993 (Approximate)   SpO2 98%   BMI 29.86 kg/m        Wt Readings from Last 3 Encounters:  10/21/23 190 lb (86.2 kg)  10/18/23 185 lb (83.9 kg)  10/12/23 190 lb (86.2 kg)    GEN: Well  nourished, well developed in no acute distress NECK: No JVD; No carotid bruits CARDIAC: RRR, no murmurs, rubs, gallops RESPIRATORY:  Clear to auscultation without rales, wheezing or rhonchi  ABDOMEN: Soft, non-tender, non-distended EXTREMITIES:  No edema; No deformity   ASSESSMENT AND PLAN  HFpEF - Udpated Pro BNP/BMET. Euvolemic and well compensated on exam. If BNP normal, continue Lasix  10mg  every other day. If elevated, plan to increase to daily. Low sodium diet, fluid restriction <2L, and daily weights encouraged. Educated to contact our office for weight gain of 2 lbs overnight or 5 lbs in one week.   COPD - per pulmonary. Recovering after recent exacerbation.   PAF / Hypercoagulable state - denies palpitations. Continue lopressor  50mg  BID, eliquis  5mg  bid. Denies bleeding complications. CHA2DS2-VASc Score = 6 [CHF History: 1, HTN History: 1, Diabetes History: 0, Stroke History: 0, Vascular Disease History: 1, Age Score: 2, Gender Score: 1].  Therefore, the patient's annual risk of stroke is 9.7 %.      HTN - BP well controlled. Continue current antihypertensive regimen.  Amlodipne 10mg  daily, lopressor  50mg  BID, irbesartan  300mg  daily. Discussed to monitor BP at home at least 2 hours after medications and sitting for 5-10 minutes.   HLD - continue pravastatin  20mg  daily. Denies myalgias.        Dispo: follow up in 6 mos  Signed, Reche GORMAN Finder, NP

## 2023-10-19 ENCOUNTER — Ambulatory Visit (HOSPITAL_BASED_OUTPATIENT_CLINIC_OR_DEPARTMENT_OTHER): Payer: Self-pay | Admitting: Family

## 2023-10-19 LAB — PRO B NATRIURETIC PEPTIDE: NT-Pro BNP: 429 pg/mL (ref 0–738)

## 2023-10-19 LAB — BASIC METABOLIC PANEL WITH GFR
BUN/Creatinine Ratio: 14 (ref 12–28)
BUN: 14 mg/dL (ref 8–27)
CO2: 23 mmol/L (ref 20–29)
Calcium: 10.3 mg/dL (ref 8.7–10.3)
Chloride: 100 mmol/L (ref 96–106)
Creatinine, Ser: 1.01 mg/dL — ABNORMAL HIGH (ref 0.57–1.00)
Glucose: 126 mg/dL — ABNORMAL HIGH (ref 70–99)
Potassium: 4.7 mmol/L (ref 3.5–5.2)
Sodium: 139 mmol/L (ref 134–144)
eGFR: 58 mL/min/1.73 — ABNORMAL LOW (ref >59)

## 2023-10-20 NOTE — Progress Notes (Unsigned)
 Leah Mata, female    DOB: 1945/12/08   MRN: [id number]  Brief patient profile:  78  yowf quit smoking 2020 @ 150 lb with onset doe around 2016  but much worse since stopped smoking  referred to pulmonary clinic 02/12/2022 by EDP at [facility]  for new onset 02 resp failure       History of Present Illness  02/12/2022  Pulmonary/ 1st office eval/Lasaro Primm  rx incruse but not using  Chief Complaint  Patient presents with   Consult    Dyspnea and SOB x 2 months  Dyspnea:  50 ft vs very inactive since 2020 hernia then hernia surgery Oct 27 23 and 02 dep since but not checking sats or titrating Cough: clear mucus more p neb  Sleep: flat bed one pillow  SABA use: just started neb but none on day of ov  02 p last admit 2lpm 24/ 7  Rec Make sure you check your oxygen saturation  AT  your highest level of activity (not after you stop)   to be sure it stays over 90%  Stop incruse and just use nebulizer albuterol  up to every 4 hours if needed  Also  Ok to try albuterol  15 min before an activity (on alternating days)  that you know would usually make you short of breath . Stop lisinopril  and start benicar  (olmesartan  20 mg twice daily in its place  Referred for best fit for POC and pfts      04/15/2022  f/u ov/[place] office/Kuper Rennels re: GOLD ?  maint on nothing and doing just as well, wants off 02  Chief Complaint  Patient presents with   Follow-up    Patient wants to discuss O2 Never got new bp meds   Dyspnea:  doing [employer] / [employer] avg pace not really limited by sob  Cough: min clear thick mucus p neb  Sleeping: flat bed one pillow no am flare  SABA use: neb rarely, no more since stopped  incruse  02: 3lpm shopping not checking sats Covid status: vax never  Lung cancer screening: referred today  Rec My office will be contacting you by phone for referral to lung cancer screening program   For cough/congestion > mucinex  dm 1200 mg every 12hours  Stop lisinopril  and start Avapro   (Ibesartan) 300 mg daily in its place and call me if a problem Ok to use nebulizer up to every 4 hours if you can't catch your breath or stop coughing  Ok to stop 0xygen       06/25/2022  f/u ov/[place] office/Youssouf Shipley re: GOLD 2 maint on neb  3  x weekly typically  p exertion but vague about what level of activity calls for saba  Chief Complaint  Patient presents with   Follow-up    Pt f/u to discuss PFT results, states that her breathing is ok  Dyspnea:  walk all  [employer]/ [employer] / up a landing x 6 steps ok   Cough: rattling cough in am's since quit smoking/ min mucoid  Sleeping: flat bed one pillow no cc  SABA use: p exertion only 02: none  Rec Ok to try albuterol  machine 15 min before an activity (on alternating days)  that you know would usually make you short of breath   If you truly are better from the nebulizer in terms of your wind when you walk I would recommend Bevespi Take 2 puffs first thing in am and then another 2 puffs about  12 hours later. (Should be on the 35 dollar plan by June 2024)  Admit date:     10/05/2023  Discharge date: 10/07/23  Discharge Physician: Elgie Butter    PCP: Norleen Lynwood ORN, MD    Recommendations at discharge:  Please follow up with cardiology in one week.  Please follow up with pulmonology in one week.  Please follow up with PCP in one week.    Discharge Diagnoses: Principal Problem:   COPD with acute exacerbation [facility])       Hospital Course:   Leah Mata is a 78 y.o. female with medical history significant of COPD, hypertension. 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  She was referred to [facility] for admission for acute copd exacerbation.    Assessment and Plan:  Acute copd exacerbation.  Started her on IV solumedrol 60 mg  daily, transition to prednisone  taper on discharge along with levalbuterol  and atrovent  nebs.  CXR is negative. CT angio a week ago negative for PE.  Add on tessalon  pearls along with robitussin.  Sputum cultures are pending. Recommend follow up on the sputum cultures on discharge.  Her oxygen sats optimal on RA, check ambulating oxygen levels.      Elevated pro bnp/ chronic diastolic CHF.  Check echocardiogram as she reports persistent dyspnea for more than 2 weeks despite steroid course.  Echocardiogram shows Left ventricular ejection fraction, by estimation, is 65 to 70%. The  left ventricle has normal function. The left ventricle has no regional  wall motion abnormalities. Left ventricular diastolic parameters are consistent with Grade II diastolic  dysfunction (pseudonormalization).  One dose of IV lasix  20 mg and  urine output in the last 24 hours was 2400 ml.  Will discharge her on low dose lasix  10 mg daily.  Recommend outpatient follow up with cardiology in one week.        Hypertension Well controlled.      Atrial fibrillation Rate controlled with metoprolol  and on eliquis  for anticoagulation.        10/21/2023  post hops  f/u ov/[place] office/Mithra Grimmer re: COPD GOLD 2  maint on just saba/sama  s/p admit c/w  ? Cardiac asthma resolved with lasix  rx  Chief Complaint  Patient presents with   Follow-up   COPD  Dyspnea:  Not limited by breathing from desired activities   Cough: none  Sleeping: flat bed/ one pillow resp cc  SABA use: just neb  02: none      No obvious day to day or daytime variability or assoc excess/ purulent sputum or mucus plugs or hemoptysis or cp or chest tightness, subjective wheeze or overt sinus or hb symptoms.    Also denies any obvious fluctuation of symptoms with weather or environmental changes or other aggravating or alleviating factors except as outlined above   No unusual exposure hx or h/o childhood pna/ asthma or knowledge of premature  birth.  Current Allergies, Complete Past Medical History, Past Surgical History, Family History, and Social History were reviewed in [employer] record.  ROS  The following are not active complaints unless bolded Hoarseness, sore throat, dysphagia, dental problems, itching, sneezing,  nasal congestion or discharge of excess mucus or purulent secretions, ear ache,   fever, chills, sweats, unintended wt loss or wt  gain, classically pleuritic or exertional cp,  orthopnea pnd or arm/hand swelling  or leg swelling improved , presyncope, palpitations, abdominal pain, anorexia, nausea, vomiting, diarrhea  or change in bowel habits or change in bladder habits, change in stools or change in urine, dysuria, hematuria,  rash, arthralgias, visual complaints, headache, numbness, weakness or ataxia or problems with walking or coordination,  change in mood or  memory.        Current Meds  Medication Sig   acetaminophen  (TYLENOL ) 500 MG tablet Take 1,000 mg by mouth every 6 (six) hours as needed for mild pain.   albuterol  (VENTOLIN  HFA) 108 (90 Base) MCG/ACT inhaler Inhale 2 puffs into the lungs every 4 (four) hours as needed for wheezing or shortness of breath.   ALPRAZolam  (XANAX ) 0.25 MG tablet Take 0.25 mg by mouth at bedtime as needed for anxiety.   amLODipine  (NORVASC ) 10 MG tablet Take 1 tablet (10 mg total) by mouth daily.   apixaban  (ELIQUIS ) 5 MG TABS tablet Take 1 tablet (5 mg total) by mouth 2 (two) times daily.   Blood Glucose Monitoring Suppl (TRUE METRIX AIR GLUCOSE METER) w/Device KIT Use as directed four times per day   clonazePAM  (KLONOPIN ) 0.5 MG tablet Take 1 tablet (0.5 mg total) by mouth 2 (two) times daily as needed for anxiety.   clonazePAM  (KLONOPIN ) 0.5 MG tablet Take 0.5 mg by mouth 2 (two) times daily as needed for anxiety.   furosemide  (LASIX ) 20 MG tablet Take 0.5 tablets (10 mg total) by mouth daily.   ipratropium (ATROVENT ) 0.02 % nebulizer solution Take 2.5  mLs (0.5 mg total) by nebulization every 6 (six) hours as needed for wheezing.   irbesartan  (AVAPRO ) 300 MG tablet Take 1 tablet (300 mg total) by mouth daily.   levalbuterol  (XOPENEX  HFA) 45 MCG/ACT inhaler Inhale 2 puffs into the lungs 4 (four) times daily.   metoprolol  tartrate (LOPRESSOR ) 50 MG tablet TAKE 1 TABLET TWICE DAILY   metoprolol  tartrate (LOPRESSOR ) 50 MG tablet Take 50 mg by mouth 2 (two) times daily.   Multiple Vitamin (MULTIVITAMIN) tablet Take 1 tablet by mouth daily.   pravastatin  (PRAVACHOL ) 20 MG tablet Take 1 tablet (20 mg total) by mouth daily.               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   Other and unspecified hyperlipidemia 08/22/2013   Pre-diabetes         Objective:    Wts  10/21/2023        190  06/25/2022       186   04/15/22 188 lb 3.2 oz (85.4 kg)  02/12/22 191 lb 9.6 oz (86.9 kg)  02/03/22 194 lb (88 kg)     Vital signs reviewed  10/21/2023  - Note at rest 02 sats  92% on RA   General appearance:    amb wm a bit confused with details of care   HEENT : Oropharynx  clear/full dentures   Nasal turbinates nl    NECK :  without  apparent JVD/ palpable Nodes/TM    LUNGS: no acc muscle use,  Mild barrel  contour chest wall with bilateral  Distant bs s audible wheeze and  without cough on insp or exp maneuvers  and mild  Hyperresonant  to  percussion bilaterally     CV:  RRR  no s3 or murmur or increase in P2, and trace pedal edema   ABD:  obese soft and nontender    MS:  Nl gait/ ext warm without deformities Or obvious joint restrictions  calf tenderness, cyanosis or clubbing     SKIN: warm and dry without lesions    NEURO:  alert, approp, nl sensorium with  no motor or cerebellar deficits apparent.        I personally reviewed images and agree with radiology impression as follows:   Chest CTa     09/28/23  No suspicious findings to suggest pulmonary embolism. Large fat attenuation structure in interatrial region may represent lipomatous hypertrophy of the interatrial septum versus a lipomatous mass, mildly enlarged to prior. Right left lung base nodule versus focal subpleural atelectasis measuring 7 mm, new to prior. Non-contrast chest CT at 6-12 months is recommended.     Assessment

## 2023-10-21 ENCOUNTER — Other Ambulatory Visit (HOSPITAL_COMMUNITY): Payer: Self-pay

## 2023-10-21 ENCOUNTER — Ambulatory Visit: Admitting: Internal Medicine

## 2023-10-21 ENCOUNTER — Encounter: Payer: Self-pay | Admitting: Internal Medicine

## 2023-10-21 ENCOUNTER — Other Ambulatory Visit: Payer: Self-pay

## 2023-10-21 VITALS — BP 117/68 | HR 69 | Ht 66.0 in | Wt 190.0 lb

## 2023-10-21 DIAGNOSIS — Z87891 Personal history of nicotine dependence: Secondary | ICD-10-CM | POA: Diagnosis not present

## 2023-10-21 DIAGNOSIS — J449 Chronic obstructive pulmonary disease, unspecified: Secondary | ICD-10-CM

## 2023-10-21 MED ORDER — TRELEGY ELLIPTA 100-62.5-25 MCG/ACT IN AEPB: 1.0000 | INHALATION_SPRAY | Freq: Every day | RESPIRATORY_TRACT | Status: DC

## 2023-10-21 MED ORDER — FUROSEMIDE 20 MG PO TABS: 10.0000 mg | ORAL_TABLET | ORAL | Status: DC

## 2023-10-21 MED ORDER — ALBUTEROL SULFATE (2.5 MG/3ML) 0.083% IN NEBU: 2.5000 mg | INHALATION_SOLUTION | RESPIRATORY_TRACT | Status: AC | PRN

## 2023-10-21 MED ORDER — ALBUTEROL SULFATE HFA 108 (90 BASE) MCG/ACT IN AERS
2.0000 | INHALATION_SPRAY | RESPIRATORY_TRACT | 0 refills | Status: AC | PRN
  Filled 2023-10-21: qty 6.7, 16d supply, fill #0

## 2023-10-21 MED ORDER — UMECLIDINIUM-VILANTEROL 62.5-25 MCG/ACT IN AEPB
1.0000 | INHALATION_SPRAY | Freq: Every day | RESPIRATORY_TRACT | 11 refills | Status: DC
  Filled 2023-10-21: qty 60, 30d supply, fill #0
  Filled 2023-11-14: qty 60, 30d supply, fill #1

## 2023-10-21 NOTE — Assessment & Plan Note (Signed)
 Stopped smoking 2020  See CTa  chest 09/28/23 with 7 mm RLL nodule > referred  to [facility] clinic 10/21/2023   Discussed in detail all the  indications, usual  risks and alternatives  relative to the benefits with patient who agrees to proceed with w/u as outlined.            Each maintenance medication was reviewed in detail including emphasizing most importantly the difference between maintenance and prns and under what circumstances the prns are to be triggered using an action plan format where appropriate.  Total time for H and P, chart review, counseling, reviewing hfa  device(s) and generating customized AVS unique to this office visit / same day charting = 40 min post hosp f/u

## 2023-10-21 NOTE — Patient Instructions (Addendum)
 Plan A = Automatic = Always=    Trelegy (ANORO) is one click each am and rinse mouth - let me know if can't afford ANORO thru your insurance and I'll look for something cheaper  Plan B = Backup (to supplement plan A, not to replace it) Use your albuterol  inhaler as a rescue medication to be used if you can't catch your breath by resting or slowing your pace  or doing a relaxed purse lip breathing pattern.  - The less you use it, the better it will work when you need it. - Ok to use the inhaler up to 2 puffs  every 4 hours if you must but call for appointment if use goes up over your usual need - Don't leave home without it !!  (think of it like the spare tire or starter fluid for your car)   Work on inhaler technique:  relax and gently blow all the way out then take a nice smooth full deep breath back in, triggering the inhaler at same time you start breathing in.  Hold breath in for at least  5 seconds if you can.       Plan C = Crisis (instead of Plan B but only if Plan B stops working) - only use your albuterol  nebulizer if you first try Plan B and it fails to help > ok to use the nebulizer up to every 4 hours but if start needing it regularly call for immediate appointment    Please schedule a follow up office visit in 6 weeks, call sooner if needed

## 2023-10-21 NOTE — Assessment & Plan Note (Signed)
 Quit smoking 2020 and worse breathing since with baseline wt 150  - try off acei 02/12/2022 pending return for pfts > did neither, try again 04/15/2022 >>>  - PFT's  05/28/22  FEV1 1.12 (53 % ) ratio 0.64  p 0 % improvement from saba p 0 prior to study with DLCO  10.93 (57%)   and FV curve mildly concave and ERV 35% at wt 185    I really dobut this was a tru aecopd without assoc cough and given the very abrupt onset of orthopnea / pnd with BNP > 500 so ok to rx as Pt is Group B in terms of symptom/risk and laba/lama therefore appropriate rx at this point >>>  anoro or bevespi or stiolto depending on insurance

## 2023-11-08 ENCOUNTER — Other Ambulatory Visit: Payer: Self-pay

## 2023-11-08 ENCOUNTER — Other Ambulatory Visit (HOSPITAL_COMMUNITY): Payer: Self-pay

## 2023-11-08 ENCOUNTER — Telehealth: Payer: Self-pay

## 2023-11-08 NOTE — Progress Notes (Signed)
 Pharmacy Quality Measure Review  This patient is appearing on a report for being at risk of failing the adherence measure for cholesterol (statin) medications this calendar year.   Medication: Pravastatin  20 mg Last fill date: 04/18/2023 for 90 day supply  Left voicemail for patient to return my call at their convenience.

## 2023-11-08 NOTE — Telephone Encounter (Signed)
 This patient has appeared in a report as failing or at risk of failing a medication adherence for cholesterol measure in 2025 fiscal year.   PDC on report: 0.47  The ASCVD Risk score (Arnett DK, et al., 2019) failed to calculate for the following reasons:   Risk score cannot be calculated because patient has a medical history suggesting prior/existing ASCVD   Barriers to adherence identified:  Does the patient have cost concerns?: No Does the patient have transportation concerns?: No Does the patient need assistance obtaining refills?: Yes Does the patient occasionally forget to take some of their prescribed medications?:Yes Does the patient feel like one/some of their medications make them feel poorly?: No Does the patient have questions or concerns about their medications?: No   Clinical intervention indicated? Yes  []  Pharmacotherapy dose escalation  [] Pharmacotherapy dose decrease [] Initiation of new pharmacotherapy  [] Pharmacotherapy discontinued  [x] Other: Enroll in auto fill.  Comments: Patient returned earlier phone call. Patient reported still taking Pravastatin , stated that used to get medications at [facility] and had an excess supply left over. Patient using pill box to help manage medications and remember to take them. Assisted patient in refilling medication and enrolled patient in auto refill. Patient denied any side effects associated with statin therapy. Will continue monitoring adherence.   CPP or PCP notified? No

## 2023-11-23 ENCOUNTER — Other Ambulatory Visit: Payer: Self-pay | Admitting: Internal Medicine

## 2023-11-23 ENCOUNTER — Other Ambulatory Visit: Payer: Self-pay

## 2023-11-23 ENCOUNTER — Other Ambulatory Visit (HOSPITAL_COMMUNITY): Payer: Self-pay

## 2023-11-24 ENCOUNTER — Other Ambulatory Visit (HOSPITAL_COMMUNITY): Payer: Self-pay

## 2023-12-08 ENCOUNTER — Ambulatory Visit (INDEPENDENT_AMBULATORY_CARE_PROVIDER_SITE_OTHER)

## 2023-12-08 VITALS — Ht 66.0 in | Wt 198.0 lb

## 2023-12-08 DIAGNOSIS — Z0000 Encounter for general adult medical examination without abnormal findings: Secondary | ICD-10-CM

## 2023-12-08 NOTE — Progress Notes (Signed)
 Subjective:   Leah Mata is a 78 y.o. who presents for a Medicare Wellness preventive visit.  As a reminder, Annual Wellness Visits don't include a physical exam, and some assessments may be limited, especially if this visit is performed virtually. We may recommend an in-person follow-up visit with your provider if needed.  Visit Complete: Virtual I connected with  Leah Mata on 12/08/23 by a audio enabled telemedicine application and verified that I am speaking with the correct person using two identifiers.  Patient Location: Home  Provider Location: Office/Clinic  I discussed the limitations of evaluation and management by telemedicine. The patient expressed understanding and agreed to proceed.  Vital Signs: Because this visit was a virtual/telehealth visit, some criteria may be missing or patient reported. Any vitals not documented were not able to be obtained and vitals that have been documented are patient reported.  VideoDeclined- This patient declined Librarian, academic. Therefore the visit was completed with audio only.  Persons Participating in Visit: Patient.  AWV Questionnaire: No: Patient Medicare AWV questionnaire was not completed prior to this visit.  Cardiac Risk Factors include: advanced age (>46men, >30 women);dyslipidemia;hypertension;obesity (BMI >30kg/m2)     Objective:    Today's Vitals   12/08/23 1314  Weight: 198 lb (89.8 kg)  Height: 5' 6 (1.676 m)   Body mass index is 31.96 kg/m.     12/08/2023    1:33 PM 10/05/2023    3:07 PM 01/07/2023   10:33 AM 01/08/2022   10:05 AM 10/27/2021    5:00 AM 08/30/2020    2:03 PM 09/06/2019   10:00 AM  Advanced Directives  Does Patient Have a Medical Advance Directive? No No No No No No No  Would patient like information on creating a medical advance directive? No - Patient declined Yes (Inpatient - patient requests chaplain consult to create a medical advance directive) No -  Patient declined No - Patient declined No - Patient declined No - Patient declined No - Patient declined    Current Medications (verified) Outpatient Encounter Medications as of 12/08/2023  Medication Sig   acetaminophen  (TYLENOL ) 500 MG tablet Take 1,000 mg by mouth every 6 (six) hours as needed for mild pain.   albuterol  (PROVENTIL ) (2.5 MG/3ML) 0.083% nebulizer solution Take 3 mLs (2.5 mg total) by nebulization every 4 (four) hours as needed.   albuterol  (VENTOLIN  HFA) 108 (90 Base) MCG/ACT inhaler Inhale 2 puffs into the lungs every 4 (four) hours as needed for wheezing or shortness of breath.   ALPRAZolam  (XANAX ) 0.25 MG tablet Take 0.25 mg by mouth at bedtime as needed for anxiety.   amLODipine  (NORVASC ) 10 MG tablet Take 1 tablet (10 mg total) by mouth daily.   apixaban  (ELIQUIS ) 5 MG TABS tablet Take 1 tablet (5 mg total) by mouth 2 (two) times daily.   benzonatate  (TESSALON ) 200 MG capsule Take 1 capsule (200 mg total) by mouth 3 (three) times daily as needed for cough.   Blood Glucose Monitoring Suppl (TRUE METRIX AIR GLUCOSE METER) w/Device KIT Use as directed four times per day   clonazePAM  (KLONOPIN ) 0.5 MG tablet Take 1 tablet (0.5 mg total) by mouth 2 (two) times daily as needed for anxiety.   clonazePAM  (KLONOPIN ) 0.5 MG tablet Take 0.5 mg by mouth 2 (two) times daily as needed for anxiety.   Fluticasone -Umeclidin-Vilant (TRELEGY ELLIPTA ) 100-62.5-25 MCG/ACT AEPB Inhale 1 puff into the lungs daily.   furosemide  (LASIX ) 20 MG tablet Take 0.5 tablets (10  mg total) by mouth every other day.   ipratropium (ATROVENT ) 0.02 % nebulizer solution Take 2.5 mLs (0.5 mg total) by nebulization every 6 (six) hours as needed for wheezing.   irbesartan  (AVAPRO ) 300 MG tablet Take 1 tablet (300 mg total) by mouth daily.   metoprolol  tartrate (LOPRESSOR ) 50 MG tablet TAKE 1 TABLET TWICE DAILY   Multiple Vitamin (MULTIVITAMIN) tablet Take 1 tablet by mouth daily.   pravastatin  (PRAVACHOL ) 20 MG  tablet Take 1 tablet (20 mg total) by mouth daily.   umeclidinium-vilanterol (ANORO ELLIPTA ) 62.5-25 MCG/ACT AEPB Inhale 1 puff into the lungs daily.   No facility-administered encounter medications on file as of 12/08/2023.    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   Procedure: ANEURYSM ABDOMINAL AORTIC REPAIR WITH HEMASHIELD GOLD VASCULAR GRAFT 22X11 mm;  Surgeon: Oris Krystal FALCON, MD;  Location: [facility] OR;  Service: Vascular;  Laterality: N/A;   CARDIAC CATHETERIZATION  03/29/2001   negative   DILATION AND CURETTAGE OF UTERUS  1985   TONSILLECTOMY Bilateral    age 30   TUBAL LIGATION  1985   XI ROBOTIC ASSISTED VENTRAL HERNIA N/A 01/22/2022   Procedure: XI ROBOTIC ASSISTED INCISIONAL HERNIA REPAIR WITH MESH;  Surgeon: Lyndel Deward PARAS, MD;  Location: [facility];  Service: General;  Laterality: N/A;   Family History  Problem Relation Age of Onset   Coronary artery disease Other        female 1st degree relative <60 and female 1st degree relative <50   Diabetes Father    Heart attack Father    Diabetes Mother    Hypertension Mother    Congestive Heart Failure Mother    Colon cancer Neg Hx    Breast cancer Neg Hx    Social History   Socioeconomic History   Marital status: Widowed    Spouse name: Not on file   Number of children: Not on file   Years of education: Not on file   Highest education level: Not on file  Occupational History   Not on file  Tobacco Use   Smoking status: Former    Current packs/day: 0.00    Average packs/day: 0.5 packs/day for 30.0 years (15.0 ttl pk-yrs)    Types: Cigarettes    Start date: 81    Quit  date: 2020    Years since quitting: 5.6   Smokeless tobacco: Never   Tobacco comments:    2 packs per week  Vaping Use   Vaping status: Never Used  Substance and Sexual Activity   Alcohol use: No   Drug use: No   Sexual activity: Not Currently    Birth control/protection: Surgical    Comment: BTL  Other Topics Concern   Not on file  Social History Narrative   Widowed   Social Drivers of Health   Financial Resource Strain: Low Risk  (12/08/2023)   Overall Financial Resource Strain (CARDIA)    Difficulty of Paying Living Expenses: Not hard at all  Food Insecurity: No Food Insecurity (12/08/2023)   Hunger Vital Sign    Worried About Running Out of Food in the Last Year: Never true    Ran  Out of Food in the Last Year: Never true  Transportation Needs: No Transportation Needs (12/08/2023)   PRAPARE - Administrator, Civil Service (Medical): No    Lack of Transportation (Non-Medical): No  Physical Activity: Insufficiently Active (12/08/2023)   Exercise Vital Sign    Days of Exercise per Week: 2 days    Minutes of Exercise per Session: 20 min  Stress: No Stress Concern Present (12/08/2023)   [employer] of Occupational Health - Occupational Stress Questionnaire    Feeling of Stress: Not at all  Social Connections: Moderately Isolated (12/08/2023)   Social Connection and Isolation Panel    Frequency of Communication with Friends and Family: More than three times a week    Frequency of Social Gatherings with Friends and Family: More than three times a week    Attends Religious Services: Never    Database administrator or Organizations: Yes    Attends Engineer, structural: More than 4 times per year    Marital Status: Widowed    Tobacco Counseling Counseling given: Not Answered Tobacco comments: 2 packs per week    Clinical Intake:  Pre-visit preparation completed: Yes  Pain : No/denies pain     BMI - recorded: 31.96 Nutritional Status: BMI >  30  Obese Nutritional Risks: None Diabetes: No  Lab Results  Component Value Date   HGBA1C 6.4 09/27/2023   HGBA1C 6.6 (H) 07/12/2022   HGBA1C 6.1 (H) 01/08/2022     How often do you need to have someone help you when you read instructions, pamphlets, or other written materials from your doctor or pharmacy?: 1 - Never  Interpreter Needed?: No  Information entered by :: Buryl Bamber,CMA   Activities of Daily Living     12/08/2023    1:20 PM 10/05/2023    3:00 PM  In your present state of health, do you have any difficulty performing the following activities:  Hearing? 0   Comment wears hearing aids   Vision? 0   Difficulty concentrating or making decisions? 0   Walking or climbing stairs? 0   Dressing or bathing? 0   Doing errands, shopping? 0 0  Preparing Food and eating ? N   Using the Toilet? N   In the past six months, have you accidently leaked urine? Y   Comment wears a pad   Do you have problems with loss of bowel control? Y   Comment wears a pad   Managing your Medications? N   Managing your Finances? N   Housekeeping or managing your Housekeeping? N     Patient Care Team: Norleen Lynwood ORN, MD as PCP - General Lonni Slain, MD as PCP - Cardiology (Cardiology) Cleotilde Ronal RAMAN, MD as Consulting Physician (Gynecology) Stechschulte, Deward PARAS, MD as Consulting Physician (Surgery) Early, Krystal FALCON, MD (Inactive) as Consulting Physician (Vascular Surgery) Marlee Bernardino NOVAK, MD as Attending Physician (Nephrology) Darlean Ozell NOVAK, MD as Consulting Physician (Pulmonary Disease)  I have updated your Care Teams any recent Medical Services you may have received from other providers in the past year.     Assessment:   This is a routine wellness examination for Leah Mata.  Hearing/Vision screen Hearing Screening - Comments:: Wears hearing aids Vision Screening - Comments:: Wears rx glasses - up to date with routine eye exams with [facility]   Goals Addressed                This Visit's Progress  Patient Stated (pt-stated)        Patient stated she wants to be able to do more but is trying to manage her breathing        Depression Screen     12/08/2023    1:22 PM 09/27/2023    9:12 AM 06/21/2023    3:57 PM 01/07/2023   10:34 AM 01/07/2023   10:32 AM 07/12/2022    3:52 PM 02/03/2022   10:36 AM  PHQ 2/9 Scores  PHQ - 2 Score 0 0 0 0 0 0 0  PHQ- 9 Score 0      0    Fall Risk     12/08/2023    1:22 PM 09/27/2023    9:12 AM 06/21/2023    4:03 PM 01/07/2023   10:28 AM 07/12/2022    3:52 PM  Fall Risk   Falls in the past year? 0 0 0 0 0  Number falls in past yr: 0 0 0 0 0  Injury with Fall? 0 0 0 0 0  Risk for fall due to : No Fall Risks  No Fall Risks No Fall Risks   Follow up Falls evaluation completed;Falls prevention discussed  Falls evaluation completed Falls evaluation completed     MEDICARE RISK AT HOME:  Medicare Risk at Home Any stairs in or around the home?: No If so, are there any without handrails?: No Home free of loose throw rugs in walkways, pet beds, electrical cords, etc?: Yes Adequate lighting in your home to reduce risk of falls?: Yes Life alert?: No Use of a cane, walker or w/c?: No Grab bars in the bathroom?: Yes Shower chair or bench in shower?: Yes Elevated toilet seat or a handicapped toilet?: No  TIMED UP AND GO:  Was the test performed?  No  Cognitive Function: 6CIT completed        12/08/2023    1:24 PM 01/07/2023   10:34 AM 09/06/2019   10:02 AM  6CIT Screen  What Year? 0 points 0 points 0 points  What month? 0 points 0 points 0 points  What time? 0 points 3 points 3 points  Count back from 20 0 points 0 points 0 points  Months in reverse 0 points 0 points 4 points  Repeat phrase 0 points 2 points 2 points  Total Score 0 points 5 points 9 points    Immunizations Immunization History  Administered Date(s) Administered   Fluad Quad(high Dose 65+) 01/20/2019, 04/20/2021, 02/03/2022    Fluad Trivalent(High Dose 65+) 01/11/2023   INFLUENZA, HIGH DOSE SEASONAL PF 03/12/2017, 04/06/2018, 03/07/2019   Influenza,inj,Quad PF,6+ Mos 12/01/2012, 01/31/2014, 02/24/2016   Influenza-Unspecified 11/28/2011, 01/11/2015   Pneumococcal Conjugate-13 08/15/2014   Pneumococcal Polysaccharide-23 05/30/2012   Td 11/28/2006   Tdap 09/07/2016   Zoster Recombinant(Shingrix) 01/11/2023, 04/28/2023   Zoster, Live 11/29/2008    Screening Tests Health Maintenance  Topic Date Due   Influenza Vaccine  10/28/2023   Medicare Annual Wellness (AWV)  12/07/2024   DTaP/Tdap/Td (3 - Td or Tdap) 09/08/2026   Pneumococcal Vaccine: 50+ Years  Completed   DEXA SCAN  Completed   Hepatitis C Screening  Completed   Zoster Vaccines- Shingrix  Completed   HPV VACCINES  Aged Out   Meningococcal B Vaccine  Aged Out   Mammogram  Discontinued   Colonoscopy  Discontinued   COVID-19 Vaccine  Discontinued    Health Maintenance Items Addressed: 12/08/2023  Additional Screening:  Vision Screening: Recommended annual ophthalmology exams for early detection  of glaucoma and other disorders of the eye. Is the patient up to date with their annual eye exam?  Yes  Who is the provider or what is the name of the office in which the patient attends annual eye exams? [facility]  Dental Screening: Recommended annual dental exams for proper oral hygiene  Community Resource Referral / Chronic Care Management: CRR required this visit?  No   CCM required this visit?  No   Plan:    I have personally reviewed and noted the following in the patient's chart:   Medical and social history Use of alcohol, tobacco or illicit drugs  Current medications and supplements including opioid prescriptions. Patient is not currently taking opioid prescriptions. Functional ability and status Nutritional status Physical activity Advanced directives List of other physicians Hospitalizations, surgeries, and ER visits in  previous 12 months Vitals Screenings to include cognitive, depression, and falls Referrals and appointments  In addition, I have reviewed and discussed with patient certain preventive protocols, quality metrics, and best practice recommendations. A written personalized care plan for preventive services as well as general preventive health recommendations were provided to patient.   Leah Mata, CMA   12/08/2023   After Visit Summary: (MyChart) Due to this being a telephonic visit, the after visit summary with patients personalized plan was offered to patient via MyChart   Notes: Nothing significant to report at this time.

## 2023-12-08 NOTE — Patient Instructions (Addendum)
 Ms. Rominger,  Thank you for taking the time for your Medicare Wellness Visit. I appreciate your continued commitment to your health goals. Please review the care plan we discussed, and feel free to reach out if I can assist you further.  Medicare recommends these wellness visits once per year to help you and your care team stay ahead of potential health issues. These visits are designed to focus on prevention, allowing your provider to concentrate on managing your acute and chronic conditions during your regular appointments.  Please note that Annual Wellness Visits do not include a physical exam. Some assessments may be limited, especially if the visit was conducted virtually. If needed, we may recommend a separate in-person follow-up with your provider.  Ongoing Care Seeing your primary care provider every 3 to 6 months helps us  monitor your health and provide consistent, personalized care.   Referrals If a referral was made during today's visit and you haven't received any updates within two weeks, please contact the referred provider directly to check on the status.  Recommended Screenings:  Health Maintenance  Topic Date Due   Flu Shot  10/28/2023   Medicare Annual Wellness Visit  12/07/2024   DTaP/Tdap/Td vaccine (3 - Td or Tdap) 09/08/2026   Pneumococcal Vaccine for age over 39  Completed   DEXA scan (bone density measurement)  Completed   Hepatitis C Screening  Completed   Zoster (Shingles) Vaccine  Completed   HPV Vaccine  Aged Out   Meningitis B Vaccine  Aged Out   Breast Cancer Screening  Discontinued   Colon Cancer Screening  Discontinued   COVID-19 Vaccine  Discontinued       12/08/2023    1:33 PM  Advanced Directives  Does Patient Have a Medical Advance Directive? No  Would patient like information on creating a medical advance directive? No - Patient declined   Advance Care Planning is important because it: Ensures you receive medical care that aligns with your  values, goals, and preferences. Provides guidance to your family and loved ones, reducing the emotional burden of decision-making during critical moments.  Vision: Annual vision screenings are recommended for early detection of glaucoma, cataracts, and diabetic retinopathy. These exams can also reveal signs of chronic conditions such as diabetes and high blood pressure.  Dental: Annual dental screenings help detect early signs of oral cancer, gum disease, and other conditions linked to overall health, including heart disease and diabetes.

## 2023-12-09 ENCOUNTER — Ambulatory Visit: Admitting: Internal Medicine

## 2023-12-09 ENCOUNTER — Other Ambulatory Visit (HOSPITAL_COMMUNITY): Payer: Self-pay

## 2023-12-09 ENCOUNTER — Encounter: Payer: Self-pay | Admitting: Internal Medicine

## 2023-12-09 VITALS — BP 115/71 | HR 73 | Ht 66.0 in | Wt 194.6 lb

## 2023-12-09 DIAGNOSIS — I4891 Unspecified atrial fibrillation: Secondary | ICD-10-CM

## 2023-12-09 DIAGNOSIS — Z87891 Personal history of nicotine dependence: Secondary | ICD-10-CM

## 2023-12-09 DIAGNOSIS — I5030 Unspecified diastolic (congestive) heart failure: Secondary | ICD-10-CM | POA: Diagnosis not present

## 2023-12-09 DIAGNOSIS — J449 Chronic obstructive pulmonary disease, unspecified: Secondary | ICD-10-CM

## 2023-12-09 DIAGNOSIS — I110 Hypertensive heart disease with heart failure: Secondary | ICD-10-CM | POA: Diagnosis not present

## 2023-12-09 MED ORDER — BREZTRI AEROSPHERE 160-9-4.8 MCG/ACT IN AERO
INHALATION_SPRAY | RESPIRATORY_TRACT | 11 refills | Status: AC
  Filled 2023-12-09: qty 10.7, 30d supply, fill #0
  Filled 2024-01-01: qty 10.7, 30d supply, fill #1
  Filled 2024-01-31: qty 10.7, 30d supply, fill #2
  Filled 2024-03-01: qty 10.7, 30d supply, fill #3
  Filled 2024-03-25: qty 10.7, 30d supply, fill #4
  Filled 2024-04-24: qty 10.7, 30d supply, fill #5

## 2023-12-09 MED ORDER — BREZTRI AEROSPHERE 160-9-4.8 MCG/ACT IN AERO: 2.0000 | INHALATION_SPRAY | Freq: Two times a day (BID) | RESPIRATORY_TRACT | Status: AC

## 2023-12-09 NOTE — Assessment & Plan Note (Addendum)
 Stopped smoking 2020 - Abn CTa  09/28/23 > rec LDSCT  6  months = 03/30/24 and then yearly until age 78

## 2023-12-09 NOTE — Progress Notes (Signed)
 Rock JONETTA Graff, female    DOB: 1945/10/24   MRN: [id number]  Brief patient profile:  78  yowf quit smoking 2020 @ 150 lb with onset doe around 2016  but much worse since stopped smoking  referred to pulmonary clinic 02/12/2022 by EDP at [facility]  for new onset 02 resp failure       History of Present Illness  02/12/2022  Pulmonary/ 1st office eval/Kaida Games  rx incruse but not using  Chief Complaint  Patient presents with   Consult    Dyspnea and SOB x 2 months  Dyspnea:  50 ft vs very inactive since 2020 hernia then hernia surgery Oct 27 23 and 02 dep since but not checking sats or titrating Cough: clear mucus more p neb  Sleep: flat bed one pillow  SABA use: just started neb but none on day of ov  02 p last admit 2lpm 24/ 7  Rec Make sure you check your oxygen saturation  AT  your highest level of activity (not after you stop)   to be sure it stays over 90%  Stop incruse and just use nebulizer albuterol  up to every 4 hours if needed  Also  Ok to try albuterol  15 min before an activity (on alternating days)  that you know would usually make you short of breath . Stop lisinopril  and start benicar  (olmesartan  20 mg twice daily in its place  Referred for best fit for POC and pfts      04/15/2022  f/u ov/[place] office/Ranbir Chew re: GOLD ?  maint on nothing and doing just as well, wants off 02  Chief Complaint  Patient presents with   Follow-up    Patient wants to discuss O2 Never got new bp meds   Dyspnea:  doing [employer] / [employer] avg pace not really limited by sob  Cough: min clear thick mucus p neb  Sleeping: flat bed one pillow no am flare  SABA use: neb rarely, no more since stopped  incruse  02: 3lpm shopping not checking sats Covid status: vax never  Lung cancer screening: referred today  Rec My office will be contacting you by phone for referral to lung cancer screening program   For cough/congestion > mucinex  dm 1200 mg every 12hours  Stop lisinopril  and start Avapro   (Ibesartan) 300 mg daily in its place and call me if a problem Ok to use nebulizer up to every 4 hours if you can't catch your breath or stop coughing  Ok to stop 0xygen       06/25/2022  f/u ov/[place] office/[name] Vancleve re: GOLD 2 maint on neb  3  x weekly typically  p exertion but vague about what level of activity calls for saba  Chief Complaint  Patient presents with   Follow-up    Pt f/u to discuss PFT results, states that her breathing is ok  Dyspnea:  walk all  [employer]/ [employer] / up a landing x 6 steps ok   Cough: rattling cough in am's since quit smoking/ min mucoid  Sleeping: flat bed one pillow no cc  SABA use: p exertion only 02: none  Rec Ok to try albuterol  machine 15 min before an activity (on alternating days)  that you know would usually make you short of breath   If you truly are better from the nebulizer in terms of your wind when you walk I would recommend Bevespi  Take 2 puffs first thing in am and then another 2 puffs about  12 hours later. (Should be on the 35 dollar plan by June 2024)  Admit date:     10/05/2023  Discharge date: 10/07/23  Discharge Physician: Elgie Butter    PCP: Norleen Lynwood ORN, MD    Recommendations at discharge:  Please follow up with cardiology in one week.  Please follow up with pulmonology in one week.  Please follow up with PCP in one week.    Discharge Diagnoses: Principal Problem:   COPD with acute exacerbation [facility])       Hospital Course:   MICHALINE KINDIG is a 78 y.o. female with medical history significant of COPD, hypertension. 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  She was referred to [facility] for admission for acute copd exacerbation.    Assessment and Plan:  Acute copd exacerbation.  Started her on IV solumedrol 60 mg  daily, transition to prednisone  taper on discharge along with levalbuterol  and atrovent  nebs.  CXR is negative. CT angio a week ago negative for PE.  Add on tessalon  pearls along with robitussin.  Sputum cultures are pending. Recommend follow up on the sputum cultures on discharge.  Her oxygen sats optimal on RA, check ambulating oxygen levels.      Elevated pro bnp/ chronic diastolic CHF.  Check echocardiogram as she reports persistent dyspnea for more than 2 weeks despite steroid course.  Echocardiogram shows Left ventricular ejection fraction, by estimation, is 65 to 70%. The  left ventricle has normal function. The left ventricle has no regional  wall motion abnormalities. Left ventricular diastolic parameters are consistent with Grade II diastolic  dysfunction (pseudonormalization).  One dose of IV lasix  20 mg and  urine output in the last 24 hours was 2400 ml.  Will discharge her on low dose lasix  10 mg daily.  Recommend outpatient follow up with cardiology in one week.        Hypertension Well controlled.      Atrial fibrillation Rate controlled with metoprolol  and on eliquis  for anticoagulation.        10/21/2023  post hops  f/u ov/[place] office/Mirjana Tarleton re: COPD GOLD 2  maint on just saba/sama  s/p admit c/w  ? Cardiac asthma resolved with lasix  rx  Chief Complaint  Patient presents with   Follow-up   COPD  Dyspnea:  Not limited by breathing from desired activities   Cough: none  Sleeping: flat bed/ one pillow resp cc  SABA use: just neb  02: none  Rec Plan A = Automatic = Always=    Trelegy (ANORO) is one click each am and rinse mouth - let me know if can't afford ANORO thru your insurance and I'll look for something cheaper Plan B = Backup (to supplement plan A, not to replace it) Use your albuterol  inhaler as a rescue medication   Work on inhaler technique:     Plan C = Crisis (instead of Plan B but only if Plan B stops working) - only use your albuterol   nebulizer if you first try Plan B   12/09/2023  f/u ov/[place] office/[employer] Tennell re: COPD GOLD 2  maint on ?   Chief Complaint  Patient presents with   COPD    F/u   Dyspnea:  worse p cut down meds  - very disorganized and confused re meds /  names/ purposes Cough: dry cough daytime  Sleeping: bed is flat / one pillow    resp cc  SABA use: last used 3 h prior to OV   02: none    No obvious day to day or daytime variability or assoc excess/ purulent sputum or mucus plugs or hemoptysis or cp or chest tightness, subjective wheeze or overt sinus or hb symptoms.    Also denies any obvious fluctuation of symptoms with weather or environmental changes or other aggravating or alleviating factors except as outlined above   No unusual exposure hx or h/o childhood pna/ asthma or knowledge of premature birth.  Current Allergies, Complete Past Medical History, Past Surgical History, Family History, and Social History were reviewed in [employer] record.  ROS  The following are not active complaints unless bolded Hoarseness, sore throat, dysphagia, dental problems, itching, sneezing,  nasal congestion or discharge of excess mucus or purulent secretions, ear ache,   fever, chills, sweats, unintended wt loss or wt gain, classically pleuritic or exertional cp,  orthopnea pnd or arm/hand swelling  or leg swelling, presyncope, palpitations, abdominal pain, anorexia, nausea, vomiting, diarrhea  or change in bowel habits or change in bladder habits, change in stools or change in urine, dysuria, hematuria,  rash, arthralgias, visual complaints, headache, numbness, weakness or ataxia or problems with walking or coordination,  change in mood or  memory.        Current Meds - - NOTE:   Unable to verify as accurately reflecting what pt takes    Medication Sig   acetaminophen  (TYLENOL ) 500 MG tablet Take 1,000 mg by mouth every 6 (six) hours as needed for mild pain.   albuterol  (PROVENTIL ) (2.5  MG/3ML) 0.083% nebulizer solution Take 3 mLs (2.5 mg total) by nebulization every 4 (four) hours as needed.   albuterol  (VENTOLIN  HFA) 108 (90 Base) MCG/ACT inhaler Inhale 2 puffs into the lungs every 4 (four) hours as needed for wheezing or shortness of breath.   ALPRAZolam  (XANAX ) 0.25 MG tablet Take 0.25 mg by mouth at bedtime as needed for anxiety.   amLODipine  (NORVASC ) 10 MG tablet Take 1 tablet (10 mg total) by mouth daily.   apixaban  (ELIQUIS ) 5 MG TABS tablet Take 1 tablet (5 mg total) by mouth 2 (two) times daily.   Blood Glucose Monitoring Suppl (TRUE METRIX AIR GLUCOSE METER) w/Device KIT Use as directed four times per day   furosemide  (LASIX ) 20 MG tablet Take 0.5 tablets (10 mg total) by mouth every other day.   ipratropium (ATROVENT ) 0.02 % nebulizer solution Take 2.5 mLs (0.5 mg total) by nebulization every 6 (six) hours as needed for wheezing.   metoprolol  tartrate (LOPRESSOR ) 50 MG tablet TAKE 1 TABLET TWICE DAILY   Multiple Vitamin (MULTIVITAMIN) tablet Take 1 tablet by mouth daily.   pravastatin  (PRAVACHOL ) 20 MG tablet Take 1 tablet (20 mg total) by mouth daily.   umeclidinium-vilanterol (ANORO ELLIPTA ) 62.5-25 MCG/ACT AEPB Inhale 1 puff into the lungs daily.         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   Other and unspecified hyperlipidemia 08/22/2013   Pre-diabetes      Objective:    Wts  12/09/2023        194  10/21/2023        190  06/25/2022       186  04/15/22 188 lb 3.2 oz (85.4 kg)  02/12/22 191 lb 9.6 oz (86.9 kg)  02/03/22 194 lb (88 kg)    Vital signs reviewed  12/09/2023  - Note at rest 02 sats  95% on RA   General appearance:    amb mod obese (by BMI)  wf nad but has mild upper airway/ psueudowheeze resolves with  PLB   HEENT : Oropharynx  clear  full set of dentures   Nasal turbinates nl    NECK :  without  apparent  JVD/ palpable Nodes/TM    LUNGS: no acc muscle use,  Mild barrel  contour chest wall with bilateral  Distant bs s audible wheeze and  without cough on insp or exp maneuvers  and mild  Hyperresonant  to  percussion bilaterally     CV:  RRR  no s3 or murmur or increase in P2, and trace pitting both LEs   ABD:  obese soft and nontender    MS:  Nl gait/ ext warm without deformities Or obvious joint restrictions  calf tenderness, cyanosis or clubbing     SKIN: warm and dry without lesions    NEURO:  alert, approp, nl sensorium with  no motor or cerebellar deficits apparent.         I personally reviewed images and agree with radiology impression as follows:   Chest CTa    09/28/23  No suspicious findings to suggest pulmonary embolism. Large fat attenuation structure in interatrial region may represent lipomatous hypertrophy of the interatrial septum versus a lipomatous mass, mildly enlarged to prior. Right left lung base nodule versus focal subpleural atelectasis measuring 7 mm, new to prior. Non-contrast chest CT at 6-12 months is recommended.     Assessment   Assessment & Plan COPD GOLD 2 Quit smoking 2020 and worse breathing since with baseline wt 150  - try off acei 02/12/2022 pending return for pfts > did neither, try again 04/15/2022 >>>  - PFT's  05/28/22  FEV1 1.12 (53 % ) ratio 0.64  p 0 % improvement from saba p 0 prior to study with DLCO  10.93 (57%)   and FV curve mildly concave and ERV 35% at wt 185   - 12/09/2023  After extensive coaching inhaler device,  effectiveness =    60% (short Ti) > try breztri  Take 2 puffs first thing in am and then another 2 puffs about 12 hours later.   Worse on just lama/laba so this is more likely  Group D (now reclassified as E) in terms of symptom/risk and laba/lama/ICS  therefore appropriate rx at this point >>>  try breztri  and more approp saba andf/u one week if not better or if breztri  too expensive consider generic symbicort  / spiriva     Former cigarette smoker Stopped smoking 2020 - Abn CTa  09/28/23 > rec LDSCT  6  months = 03/30/24 and then yearly until age 66           Each maintenance medication was reviewed in detail including emphasizing most importantly the difference between maintenance and prns and under what circumstances the prns are to be triggered using an action plan format where appropriate.  Total time for H and P, chart review, counseling, reviewing hfa/neb  device(s) and generating customized AVS unique to this office visit / same day charting = 42 min for pt with multiple acute on chronic  refractory respiratory  symptoms of uncertain etiology        AVS  Patient Instructions  Stop Anor and ipatropium  Plan A = Automatic = Always=    Breztri  Take 2 puffs first thing in am and then another 2 puffs about 12 hours later.   Work on inhaler technique:  relax and gently blow all the way out then take a nice smooth full deep breath back in, triggering the inhaler at same time you start breathing in.  Hold breath in for at least  5 seconds if you can. Blow out breztri  thru nose. Rinse and gargle with water  when done.  If mouth or throat bother you at all,  try brushing teeth/gums/tongue with arm and hammer toothpaste/ make a slurry and gargle and spit out.       Plan B = Backup (to supplement plan A, not to replace it) Use your albuterol  inhaler as a rescue medication to be used if you can't catch your breath by resting or slowing your pace  or doing a relaxed purse lip breathing pattern.  - The less you use it, the better it will work when you need it. - Ok to use the inhaler up to 2 puffs  every 4 hours if you must but call for appointment if use goes up over your usual need - Don't leave home without it !!  (think of it like the spare tire or starter fluid for your car)   Plan C = Crisis (instead of Plan B but only if Plan B stops working) - only use your albuterol  nebulizer if you first try Plan B and it  fails to help > ok to use the nebulizer up to every 4 hours but if start needing it regularly call for immediate appointment    Take lasix  whole pill thru the weekend    Please schedule a follow up office visit in 6 weeks, call sooner if needed with all RESPIRATORY medications /inhalers/ solutions in hand so we can verify exactly what you are taking. This includes all medications from all doctors and over the counters.   Late add: Discuss LDSCT next ov       Ozell America, MD 12/09/2023

## 2023-12-09 NOTE — Assessment & Plan Note (Addendum)
 Quit smoking 2020 and worse breathing since with baseline wt 150  - try off acei 02/12/2022 pending return for pfts > did neither, try again 04/15/2022 >>>  - PFT's  05/28/22  FEV1 1.12 (53 % ) ratio 0.64  p 0 % improvement from saba p 0 prior to study with DLCO  10.93 (57%)   and FV curve mildly concave and ERV 35% at wt 185   - 12/09/2023  After extensive coaching inhaler device,  effectiveness =    60% (short Ti) > try breztri  Take 2 puffs first thing in am and then another 2 puffs about 12 hours later.   Worse on just lama/laba so this is more likely  Group D (now reclassified as E) in terms of symptom/risk and laba/lama/ICS  therefore appropriate rx at this point >>>  try breztri  and more approp saba andf/u one week if not better or if breztri  too expensive consider generic symbicort  / spiriva

## 2023-12-09 NOTE — Patient Instructions (Addendum)
 Stop Anor and ipatropium  Plan A = Automatic = Always=    Breztri  Take 2 puffs first thing in am and then another 2 puffs about 12 hours later.   Work on inhaler technique:  relax and gently blow all the way out then take a nice smooth full deep breath back in, triggering the inhaler at same time you start breathing in.  Hold breath in for at least  5 seconds if you can. Blow out breztri  thru nose. Rinse and gargle with water  when done.  If mouth or throat bother you at all,  try brushing teeth/gums/tongue with arm and hammer toothpaste/ make a slurry and gargle and spit out.       Plan B = Backup (to supplement plan A, not to replace it) Use your albuterol  inhaler as a rescue medication to be used if you can't catch your breath by resting or slowing your pace  or doing a relaxed purse lip breathing pattern.  - The less you use it, the better it will work when you need it. - Ok to use the inhaler up to 2 puffs  every 4 hours if you must but call for appointment if use goes up over your usual need - Don't leave home without it !!  (think of it like the spare tire or starter fluid for your car)   Plan C = Crisis (instead of Plan B but only if Plan B stops working) - only use your albuterol  nebulizer if you first try Plan B and it fails to help > ok to use the nebulizer up to every 4 hours but if start needing it regularly call for immediate appointment    Take lasix  whole pill thru the weekend    Please schedule a follow up office visit in 6 weeks, call sooner if needed with all RESPIRATORY medications /inhalers/ solutions in hand so we can verify exactly what you are taking. This includes all medications from all doctors and over the counters.   Late add: Discuss LDSCT next ov

## 2023-12-10 ENCOUNTER — Other Ambulatory Visit (HOSPITAL_COMMUNITY): Payer: Self-pay

## 2023-12-12 NOTE — Telephone Encounter (Unsigned)
 Copied from CRM [id number]. Topic: Clinical - Medication Refill >> Dec 12, 2023  3:41 PM Harlene ORN wrote: Medication: furosemide  (LASIX ) 20 MG tablet  Has the patient contacted their pharmacy? No (Agent: If no, request that the patient contact the pharmacy for the refill. If patient does not wish to contact the pharmacy document the reason why and proceed with request.) (Agent: If yes, when and what did the pharmacy advise?)  This is the patient's preferred pharmacy:   [id number] - [place], [place] - [address] [place] [address] Phone: [phone] Fax: [phone]  Is this the correct pharmacy for this prescription? Yes If no, delete pharmacy and type the correct one.   Has the prescription been filled recently? No  Is the patient out of the medication? Yes  Has the patient been seen for an appointment in the last year OR does the patient have an upcoming appointment? Yes  Can we respond through MyChart? No  Agent: Please be advised that Rx refills may take up to 3 business days. We ask that you follow-up with your pharmacy.

## 2023-12-21 ENCOUNTER — Encounter: Payer: Self-pay | Admitting: Pharmacist

## 2023-12-21 NOTE — Progress Notes (Signed)
 Pharmacy Quality Measure Review  This patient is appearing on a report for being at risk of failing the adherence measure for cholesterol (statin) and hypertension (ACEi/ARB) medications this calendar year.   Medication: Irbesartan  300 mg Last fill date: 10/12/23 for 90 day supply  Medication: Pravastatin  20 mg Last fill date: 11/09/23 for 90 day supply  Next medication refill is past the fail date due to irbesartan  was filled in January and then July 2025. Pt will fail metric for this year.  Insurance report was not up to date for pravastatin . No action needed at this time. Will likely fail metric given med was filled January 2025 and then not again until August this year.  Darrelyn Drum, PharmD, BCPS, CPP Clinical Pharmacist Practitioner  Primary Care at [facility] [phone]

## 2023-12-22 ENCOUNTER — Other Ambulatory Visit: Payer: Self-pay | Admitting: Internal Medicine

## 2023-12-22 NOTE — Telephone Encounter (Signed)
 Copied from CRM [id number]. Topic: Clinical - Medication Refill >> Dec 22, 2023  3:04 PM Pinkey ORN wrote: Medication: furosemide  (LASIX ) 20 MG tablet  Has the patient contacted their pharmacy? Yes (Agent: If no, request that the patient contact the pharmacy for the refill. If patient does not wish to contact the pharmacy document the reason why and proceed with request.) (Agent: If yes, when and what did the pharmacy advise?)  This is the patient's preferred pharmacy:   [id number] - [place], [place] - [address] [place] [address] Phone: [phone] Fax: [phone]  Is this the correct pharmacy for this prescription? Yes If no, delete pharmacy and type the correct one.   Has the prescription been filled recently? No  Is the patient out of the medication? No  Has the patient been seen for an appointment in the last year OR does the patient have an upcoming appointment? Yes  Can we respond through MyChart? Yes  Agent: Please be advised that Rx refills may take up to 3 business days. We ask that you follow-up with your pharmacy.

## 2023-12-23 NOTE — Telephone Encounter (Signed)
 Please to ask patient to request the lasix  refill per Cardiology as they started this it seems in July and I am not sure of the overall plan for her and this medication    thanks

## 2024-01-03 ENCOUNTER — Other Ambulatory Visit (HOSPITAL_COMMUNITY): Payer: Self-pay

## 2024-01-03 ENCOUNTER — Other Ambulatory Visit: Payer: Self-pay | Admitting: Cardiology

## 2024-01-03 MED ORDER — APIXABAN 5 MG PO TABS
5.0000 mg | ORAL_TABLET | Freq: Two times a day (BID) | ORAL | 3 refills | Status: AC
  Filled 2024-01-03: qty 180, 90d supply, fill #0
  Filled 2024-04-02 – 2024-04-11 (×3): qty 180, 90d supply, fill #1
  Filled 2024-04-11: qty 60, 30d supply, fill #1

## 2024-01-03 NOTE — Telephone Encounter (Signed)
 Prescription refill request for Eliquis  received. Indication:afib Last office visit:7/25 Scr:1.01  7/25 Age: 78 Weight:88.3  kg  Prescription refilled

## 2024-01-04 ENCOUNTER — Other Ambulatory Visit (HOSPITAL_COMMUNITY): Payer: Self-pay

## 2024-01-14 ENCOUNTER — Other Ambulatory Visit: Payer: Self-pay | Admitting: Internal Medicine

## 2024-01-20 ENCOUNTER — Ambulatory Visit: Admitting: Internal Medicine

## 2024-01-20 ENCOUNTER — Other Ambulatory Visit (HOSPITAL_COMMUNITY): Payer: Self-pay

## 2024-01-20 ENCOUNTER — Other Ambulatory Visit: Payer: Self-pay

## 2024-01-20 ENCOUNTER — Encounter: Payer: Self-pay | Admitting: Internal Medicine

## 2024-01-20 VITALS — BP 120/70 | HR 88 | Ht 66.0 in | Wt 197.2 lb

## 2024-01-20 DIAGNOSIS — J449 Chronic obstructive pulmonary disease, unspecified: Secondary | ICD-10-CM

## 2024-01-20 DIAGNOSIS — Z87891 Personal history of nicotine dependence: Secondary | ICD-10-CM | POA: Diagnosis not present

## 2024-01-20 DIAGNOSIS — I10 Essential (primary) hypertension: Secondary | ICD-10-CM | POA: Diagnosis not present

## 2024-01-20 MED ORDER — FUROSEMIDE 20 MG PO TABS
ORAL_TABLET | ORAL | 2 refills | Status: AC
  Filled 2024-01-20: qty 30, 30d supply, fill #0

## 2024-01-20 NOTE — Patient Instructions (Addendum)
 My office will be contacting you by phone for referral to lung cancer screening   [phone]) - if you don't hear back from my office within one week,  please call us  back or notify us  thru MyChart and we'll address it right away.    Please remember to go to the lab department   for your tests - we will call you with the results when they are available.  Ok to continue lasix  until your see Dr Norleen and discuss further refills/ dosing with him as it appears your breathing and leg swelling have improved while on the full dose of 20 mg daily    Please schedule a follow up visit in 6  months but call sooner if needed

## 2024-01-20 NOTE — Assessment & Plan Note (Addendum)
 Try off ACEi   04/15/2022 due to cough  / improved  - added daily lasix  20 mg as of 12/09/23 with improvement in doe/ leg swelling an hbp   >>>  Check bmet and f/u with Dr Norleen as I only gave her a 90 day supply  Discussed in detail all the  indications, usual  risks and alternatives  relative to the benefits with patient who agrees to proceed with Rx as outlined.           Each maintenance medication was reviewed in detail including emphasizing most importantly the difference between maintenance and prns and under what circumstances the prns are to be triggered using an action plan format where appropriate.  Total time for H and P, chart review, counseling, reviewing hfa/neb device(s) and generating customized AVS unique to this office visit / same day charting = 35 min

## 2024-01-20 NOTE — Assessment & Plan Note (Addendum)
 Quit smoking 2020 and worse breathing since with baseline wt 150  - try off acei 02/12/2022 pending return for pfts > did neither, try again 04/15/2022 >>>  - PFT's  05/28/22  FEV1 1.12 (53 % ) ratio 0.64  p 0 % improvement from saba p 0 prior to study with DLCO  10.93 (57%)   and FV curve mildly concave and ERV 35% at wt 185   - 12/09/2023    try breztri      - 01/20/2024  After extensive coaching inhaler device,  effectiveness =    80%hfa > continue breztri     Group E in terms of symptoms/risk so  laba/lama/ICS  therefore appropriate rx at this point >>>  breztri    and now on  approp SABA prn.

## 2024-01-20 NOTE — Progress Notes (Signed)
 Rock JONETTA Graff, female    DOB: [birth year]   MRN: [id number]  Brief patient profile:  78  yowf quit smoking 2020 @ 150 lb with onset doe around 2016  but much worse since stopped smoking  referred to pulmonary clinic 02/12/2022 by EDP at [facility]  for new onset 02 resp failure       History of Present Illness  02/12/2022  Pulmonary/ 1st office eval/Maylon Sailors  rx incruse but not using  Chief Complaint  Patient presents with   Consult    Dyspnea and SOB x 2 months  Dyspnea:  50 ft vs very inactive since 2020 hernia then hernia surgery Oct 27 23 and 02 dep since but not checking sats or titrating Cough: clear mucus more p neb  Sleep: flat bed one pillow  SABA use: just started neb but none on day of ov  02 p last admit 2lpm 24/ 7  Rec Make sure you check your oxygen saturation  AT  your highest level of activity (not after you stop)   to be sure it stays over 90%  Stop incruse and just use nebulizer albuterol  up to every 4 hours if needed  Also  Ok to try albuterol  15 min before an activity (on alternating days)  that you know would usually make you short of breath . Stop lisinopril  and start benicar  (olmesartan  20 mg twice daily in its place  Referred for best fit for POC and pfts      04/15/2022  f/u ov/[place] office/Jaelani Posa re: GOLD ?  maint on nothing and doing just as well, wants off 02  Chief Complaint  Patient presents with   Follow-up    Patient wants to discuss O2 Never got new bp meds   Dyspnea:  doing [employer] / [employer] avg pace not really limited by sob  Cough: min clear thick mucus p neb  Sleeping: flat bed one pillow no am flare  SABA use: neb rarely, no more since stopped  incruse  02: 3lpm shopping not checking sats Covid status: vax never  Lung cancer screening: referred today  Rec My office will be contacting you by phone for referral to lung cancer screening program   For cough/congestion > mucinex  dm 1200 mg every 12hours  Stop lisinopril  and start Avapro   (Ibesartan) 300 mg daily in its place and call me if a problem Ok to use nebulizer up to every 4 hours if you can't catch your breath or stop coughing  Ok to stop 0xygen     06/25/2022  f/u ov/[place] office/Darrell Hauk re: GOLD 2 maint on neb  3  x weekly typically  p exertion but vague about what level of activity calls for saba  Chief Complaint  Patient presents with   Follow-up    Pt f/u to discuss PFT results, states that her breathing is ok  Dyspnea:  walk all  [employer]/ [employer] / up a landing x 6 steps ok   Cough: rattling cough in am's since quit smoking/ min mucoid  Sleeping: flat bed one pillow no cc  SABA use: p exertion only 02: none  Rec Ok to try albuterol  machine 15 min before an activity (on alternating days)  that you know would usually make you short of breath   If you truly are better from the nebulizer in terms of your wind when you walk I would recommend Bevespi  Take 2 puffs first thing in am and then another 2 puffs about 12 hours  later. (Should be on the 35 dollar plan by June 2024)  Admit date:     10/05/2023  Discharge date: 10/07/23  Discharge Physician: Elgie Butter    PCP: Norleen Lynwood ORN, MD    Recommendations at discharge:  Please follow up with cardiology in one week.  Please follow up with pulmonology in one week.  Please follow up with PCP in one week.    Discharge Diagnoses: Principal Problem:   COPD with acute exacerbation [facility])       Hospital Course:   HERA CELAYA is a 78 y.o. female with medical history significant of COPD, hypertension. 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  She was referred to [facility] for admission for acute copd exacerbation.    Assessment and Plan:  Acute copd exacerbation.  Started her on IV solumedrol 60 mg daily,  transition to prednisone  taper on discharge along with levalbuterol  and atrovent  nebs.  CXR is negative. CT angio a week ago negative for PE.  Add on tessalon  pearls along with robitussin.  Sputum cultures are pending. Recommend follow up on the sputum cultures on discharge.  Her oxygen sats optimal on RA, check ambulating oxygen levels.      Elevated pro bnp/ chronic diastolic CHF.  Check echocardiogram as she reports persistent dyspnea for more than 2 weeks despite steroid course.  Echocardiogram shows Left ventricular ejection fraction, by estimation, is 65 to 70%. The  left ventricle has normal function. The left ventricle has no regional  wall motion abnormalities. Left ventricular diastolic parameters are consistent with Grade II diastolic  dysfunction (pseudonormalization).  One dose of IV lasix  20 mg and  urine output in the last 24 hours was 2400 ml.  Will discharge her on low dose lasix  10 mg daily.  Recommend outpatient follow up with cardiology in one week.        Hypertension Well controlled.      Atrial fibrillation Rate controlled with metoprolol  and on eliquis  for anticoagulation.        10/21/2023  post hops  f/u ov/[place] office/Hina Gupta re: COPD GOLD 2  maint on just saba/sama  s/p admit c/w  ? Cardiac asthma resolved with lasix  rx  Chief Complaint  Patient presents with   Follow-up   COPD  Dyspnea:  Not limited by breathing from desired activities   Cough: none  Sleeping: flat bed/ one pillow resp cc  SABA use: just neb  02: none  Rec Plan A = Automatic = Always=    Trelegy (ANORO) is one click each am and rinse mouth - let me know if can't afford ANORO thru your insurance and I'll look for something cheaper Plan B = Backup (to supplement plan A, not to replace it) Use your albuterol  inhaler as a rescue medication   Work on inhaler technique:     Plan C = Crisis (instead of Plan B but only if Plan B stops working) - only use your albuterol  nebulizer if  you first try Plan B   12/09/2023  f/u ov/[place] office/Abbygale Lapid re: COPD GOLD 2  maint on ?   Chief Complaint  Patient presents with   COPD    F/u   Dyspnea:  worse p cut down meds  - very disorganized and confused re meds /names/ purposes  Cough: dry cough daytime  Sleeping: bed is flat / one pillow    resp cc  SABA use: last used 3 h prior to OV   02: none  Patient Instructions  Stop Anor and ipatropium Plan A = Automatic = Always=    Breztri  Take 2 puffs first thing in am and then another 2 puffs about 12 hours later.  Work on inhaler technique:    Plan B = Backup (to supplement plan A, not to replace it) Use your albuterol  inhaler as a rescue medication  Plan C = Crisis (instead of Plan B but only if Plan B stops working) - only use your albuterol  nebulizer if you first try Plan B a   Take lasix  whole pill thru the weekend   Please schedule a follow up office visit in 6 weeks, call sooner if needed with all RESPIRATORY medications /inhalers/ solutions in hand     01/20/2024  f/u ov/[place] office/Shoua Ressler re: GOLD 2 COPD  maint on Breztri   did not bring all meds  / stayed on whole lasix  20 mg since last ov with improvement in doe and leg swelling    Chief Complaint  Patient presents with   COPD    F/u  [place]    Dyspnea:  able to walk at [employer] / still using [employer] parking  Cough: none  Sleeping: flat bed one pillow s  resp cc  SABA use: none  02: none   Lung cancer screening: referred today  No obvious day to day or daytime variability or assoc excess/ purulent sputum or mucus plugs or hemoptysis or cp or chest tightness, subjective wheeze or overt sinus or hb symptoms.    Also denies any obvious fluctuation of symptoms with weather or environmental changes or other aggravating or alleviating factors except as outlined above   No unusual exposure hx or h/o childhood pna/ asthma or knowledge of premature birth.  Current Allergies, Complete Past Medical History, Past Surgical  History, Family History, and Social History were reviewed in [employer] record.  ROS  The following are not active complaints unless bolded Hoarseness, sore throat, dysphagia, dental problems, itching, sneezing,  nasal congestion or discharge of excess mucus or purulent secretions, ear ache,   fever, chills, sweats, unintended wt loss or wt gain, classically pleuritic or exertional cp,  orthopnea pnd or arm/hand swelling  or leg swelling, presyncope, palpitations, abdominal pain, anorexia, nausea, vomiting, diarrhea  or change in bowel habits or change in bladder habits, change in stools or change in urine, dysuria, hematuria,  rash, arthralgias, visual complaints, headache, numbness, weakness or ataxia or problems with walking or coordination,  change in mood or  memory.           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   Other and unspecified hyperlipidemia 08/22/2013   Pre-diabetes      Objective:    Wts  01/20/2024      197  12/09/2023        194  10/21/2023        190  06/25/2022       186   04/15/22 188 lb 3.2 oz (85.4 kg)  02/12/22 191 lb 9.6 oz (86.9 kg)  02/03/22 194 lb (88 kg)     Vital signs reviewed  01/20/2024  -  Note at rest 02 sats  92% on RA   General appearance:    amb wf  nad/ all smiles today        HEENT : Oropharynx  clear/ full set dentures    Nasal turbinates nl    NECK :  without  apparent JVD/ palpable Nodes/TM    LUNGS: no acc muscle use,  Mild barrel  contour chest wall with bilateral  Distant bs s audible wheeze and  without cough on insp or exp maneuvers  and mild  Hyperresonant  to  percussion bilaterally     CV:  RRR  no s3 or murmur or increase in P2, and no edema   ABD:  soft and nontender   MS:  Nl gait/ ext warm without deformities Or obvious joint restrictions  calf  tenderness, cyanosis or clubbing     SKIN: warm and dry without lesions    NEURO:  alert, approp, nl sensorium with  no motor or cerebellar deficits apparent.       I personally reviewed images and agree with radiology impression as follows:   Chest CTa    09/28/23  No suspicious findings to suggest pulmonary embolism. Large fat attenuation structure in interatrial region may represent lipomatous hypertrophy of the interatrial septum versus a lipomatous mass, mildly enlarged to prior. Right left lung base nodule versus focal subpleural atelectasis measuring 7 mm, new to prior. Non-contrast chest CT at 6-12 months is recommended.     Assessment   Assessment & Plan COPD GOLD 2 Quit smoking 2020 and worse breathing since with baseline wt 150  - try off acei 02/12/2022 pending return for pfts > did neither, try again 04/15/2022 >>>  - PFT's  05/28/22  FEV1 1.12 (53 % ) ratio 0.64  p 0 % improvement from saba p 0 prior to study with DLCO  10.93 (57%)   and FV curve mildly concave and ERV 35% at wt 185   - 12/09/2023    try breztri      - 01/20/2024  After extensive coaching inhaler device,  effectiveness =    80%hfa > continue breztri     Group E in terms of symptoms/risk so  laba/lama/ICS  therefore appropriate rx at this point >>>  breztri    and now on  approp SABA prn.   Former cigarette smoker Stopped smoking 2020 - Abn CTa  09/28/23 > rec LDSCT  6  months > referred to [facility] 01/20/2024   Low-dose CT lung cancer screening is recommended for patients who are 63-5 years of age with a 20+ pack-year history of smoking and who are currently smoking or quit <=15 years ago. No coughing up blood  No unintentional weight loss of > 15 pounds in the last 6 months - pt is eligible for scanning yearly until age 19 > referred 01/20/2024 >>>   Essential hypertension Try off ACEi   04/15/2022 due to cough  / improved  - added daily lasix  20 mg as of 12/09/23 with improvement in doe/ leg swelling an hbp    >>>  Check bmet and f/u with Dr Norleen as I only gave her a 90 day supply  Discussed in detail all the  indications, usual  risks and alternatives  relative to the benefits with patient who agrees to proceed with Rx as outlined.           Each maintenance medication was reviewed in detail including emphasizing most importantly the difference between maintenance and prns and under  what circumstances the prns are to be triggered using an action plan format where appropriate.  Total time for H and P, chart review, counseling, reviewing hfa/neb device(s) and generating customized AVS unique to this office visit / same day charting = 35 min              AVS  Patient Instructions  My office will be contacting you by phone for referral to lung cancer screening   [phone]) - if you don't hear back from my office within one week,  please call us  back or notify us  thru MyChart and we'll address it right away.    Please remember to go to the lab department   for your tests - we will call you with the results when they are available.  Ok to continue lasix  until your see Dr Norleen and discuss further refills/ dosing with him as it appears your breathing and leg swelling have improved while on the full dose of 20 mg daily    Please schedule a follow up visit in 6  months but call sooner if needed                 Ozell America, MD 01/20/2024

## 2024-01-20 NOTE — Assessment & Plan Note (Addendum)
 Stopped smoking 2020 - Abn CTa  09/28/23 > rec LDSCT  6  months > referred to [facility] 01/20/2024   Low-dose CT lung cancer screening is recommended for patients who are 33-76 years of age with a 20+ pack-year history of smoking and who are currently smoking or quit <=15 years ago. No coughing up blood  No unintentional weight loss of > 15 pounds in the last 6 months - pt is eligible for scanning yearly until age 88 > referred 01/20/2024 >>>

## 2024-01-21 ENCOUNTER — Ambulatory Visit: Payer: Self-pay | Admitting: Internal Medicine

## 2024-01-21 ENCOUNTER — Other Ambulatory Visit (HOSPITAL_COMMUNITY): Payer: Self-pay

## 2024-01-21 LAB — BASIC METABOLIC PANEL WITH GFR
BUN/Creatinine Ratio: 14 (ref 12–28)
BUN: 14 mg/dL (ref 8–27)
CO2: 24 mmol/L (ref 20–29)
Calcium: 10.3 mg/dL (ref 8.7–10.3)
Chloride: 99 mmol/L (ref 96–106)
Creatinine, Ser: 0.98 mg/dL (ref 0.57–1.00)
Glucose: 149 mg/dL — ABNORMAL HIGH (ref 70–99)
Potassium: 4.1 mmol/L (ref 3.5–5.2)
Sodium: 139 mmol/L (ref 134–144)
eGFR: 60 mL/min/1.73 (ref >59)

## 2024-01-23 ENCOUNTER — Other Ambulatory Visit: Payer: Self-pay | Admitting: Internal Medicine

## 2024-01-23 ENCOUNTER — Other Ambulatory Visit (HOSPITAL_BASED_OUTPATIENT_CLINIC_OR_DEPARTMENT_OTHER): Payer: Self-pay

## 2024-01-23 DIAGNOSIS — Z1231 Encounter for screening mammogram for malignant neoplasm of breast: Secondary | ICD-10-CM

## 2024-01-24 NOTE — Progress Notes (Signed)
 Atc x1 pt answered the phone then hung up

## 2024-01-26 ENCOUNTER — Other Ambulatory Visit: Payer: Self-pay

## 2024-01-31 ENCOUNTER — Other Ambulatory Visit: Payer: Self-pay | Admitting: Internal Medicine

## 2024-01-31 ENCOUNTER — Other Ambulatory Visit: Payer: Self-pay

## 2024-01-31 ENCOUNTER — Other Ambulatory Visit (HOSPITAL_COMMUNITY): Payer: Self-pay

## 2024-01-31 MED ORDER — PRAVASTATIN SODIUM 20 MG PO TABS
20.0000 mg | ORAL_TABLET | Freq: Every day | ORAL | 3 refills | Status: AC
  Filled 2024-01-31: qty 90, 90d supply, fill #0
  Filled 2024-04-30: qty 90, 90d supply, fill #1

## 2024-02-01 DIAGNOSIS — Z961 Presence of intraocular lens: Secondary | ICD-10-CM | POA: Diagnosis not present

## 2024-02-06 ENCOUNTER — Other Ambulatory Visit (HOSPITAL_BASED_OUTPATIENT_CLINIC_OR_DEPARTMENT_OTHER): Payer: Self-pay

## 2024-02-21 ENCOUNTER — Ambulatory Visit
Admission: RE | Admit: 2024-02-21 | Discharge: 2024-02-21 | Disposition: A | Discharge status: Home / Self Care | Source: Ambulatory Visit | Attending: Internal Medicine | Admitting: Internal Medicine

## 2024-02-21 DIAGNOSIS — Z1231 Encounter for screening mammogram for malignant neoplasm of breast: Secondary | ICD-10-CM | POA: Diagnosis not present

## 2024-02-29 ENCOUNTER — Ambulatory Visit (HOSPITAL_BASED_OUTPATIENT_CLINIC_OR_DEPARTMENT_OTHER): Admitting: Pulmonary Disease

## 2024-02-29 ENCOUNTER — Ambulatory Visit: Payer: Self-pay | Admitting: Internal Medicine

## 2024-02-29 ENCOUNTER — Other Ambulatory Visit: Payer: Self-pay

## 2024-02-29 ENCOUNTER — Encounter (HOSPITAL_BASED_OUTPATIENT_CLINIC_OR_DEPARTMENT_OTHER): Payer: Self-pay | Admitting: Pulmonary Disease

## 2024-02-29 VITALS — BP 135/82 | HR 83 | Temp 98.8°F | Ht 66.0 in | Wt 194.4 lb

## 2024-02-29 DIAGNOSIS — J441 Chronic obstructive pulmonary disease with (acute) exacerbation: Secondary | ICD-10-CM

## 2024-02-29 LAB — POCT INFLUENZA A/B
Influenza A, POC: NEGATIVE
Influenza B, POC: NEGATIVE

## 2024-02-29 LAB — POC COVID19 BINAXNOW: SARS Coronavirus 2 Ag: NEGATIVE

## 2024-02-29 MED ORDER — METHYLPREDNISOLONE ACETATE 80 MG/ML IJ SUSP
80.0000 mg | Freq: Once | INTRAMUSCULAR | Status: AC
  Administered 2024-02-29: 80 mg via INTRAMUSCULAR

## 2024-02-29 MED ORDER — PREDNISONE 10 MG PO TABS
10.0000 mg | ORAL_TABLET | Freq: Every day | ORAL | 0 refills | Status: DC
  Filled 2024-02-29: qty 20, 20d supply, fill #0

## 2024-02-29 MED ORDER — AZITHROMYCIN 250 MG PO TABS
ORAL_TABLET | ORAL | 0 refills | Status: DC
  Filled 2024-02-29: qty 6, fill #0

## 2024-02-29 MED ORDER — AZITHROMYCIN 250 MG PO TABS: ORAL_TABLET | ORAL | 0 refills | Status: DC

## 2024-02-29 MED ORDER — ALBUTEROL SULFATE (2.5 MG/3ML) 0.083% IN NEBU
2.5000 mg | INHALATION_SOLUTION | Freq: Once | RESPIRATORY_TRACT | Status: AC
  Administered 2024-02-29: 2.5 mg via RESPIRATORY_TRACT

## 2024-02-29 MED ORDER — PREDNISONE 10 MG PO TABS: 10.0000 mg | ORAL_TABLET | Freq: Every day | ORAL | 0 refills | Status: DC

## 2024-02-29 NOTE — Telephone Encounter (Signed)
 [email or web address] Pulmonary Triage - Initial Assessment Questions Chief Complaint (e.g., cough, sob, wheezing, fever, chills, sweat or additional symptoms) *Go to specific symptom protocol after initial questions. SOB with cough producing brown mucous.  How long have symptoms been present? Yesterday.  Have you tested for COVID or Flu? Note: If not, ask patient if a home test can be taken. If so, instruct patient to call back for positive results. No  MEDICINES:   Have you used any OTC meds to help with symptoms? Yes If yes, ask What medications? Muccinex  Have you used your inhalers/maintenance medication? Yes If yes, What medications? Breztri . She states Dr Darlean told her to discontinue using her albuterol  nebulizer and inhaler. Only using Breztri .  If inhaler, ask How many puffs and how often? Note: Review instructions on medication in the chart. 2 puffs bid  OXYGEN: Do you wear supplemental oxygen? No If yes, How many liters are you supposed to use? N/A.  Do you monitor your oxygen levels? Yes If yes, What is your reading (oxygen level) today? Doesn't have it available to check.  What is your usual oxygen saturation reading?  (Note: Pulmonary O2 sats should be 90% or greater) 92% at last office visit              Copied from CRM #[id number]. Topic: Clinical - Red Word Triage >> Feb 29, 2024 11:11 AM Robinson H wrote: [facility] that prompted transfer to Nurse Triage: Trouble breathing, coughing up brown mucus Reason for Disposition  [1] MILD difficulty breathing (e.g., minimal/no SOB at rest, SOB with walking, pulse < 100) AND [2] NEW-onset or WORSE than normal  Answer Assessment - Initial Assessment Questions E2C2 Pulmonary Triage - Initial Assessment Questions Chief Complaint (e.g., cough, sob, wheezing, fever, chills, sweat or additional symptoms) *Go to specific symptom protocol after initial questions. SOB with cough producing brown mucous.  How  long have symptoms been present? Yesterday.  Have you tested for COVID or Flu? Note: If not, ask patient if a home test can be taken. If so, instruct patient to call back for positive results. No  MEDICINES:   Have you used any OTC meds to help with symptoms? Yes If yes, ask What medications? Muccinex  Have you used your inhalers/maintenance medication? Yes If yes, What medications? Breztri . She states Dr Darlean told her to discontinue using her albuterol  nebulizer and inhaler. Only using Breztri .  If inhaler, ask How many puffs and how often? Note: Review instructions on medication in the chart. 2 puffs bid  OXYGEN: Do you wear supplemental oxygen? No If yes, How many liters are you supposed to use? N/A.  Do you monitor your oxygen levels? Yes If yes, What is your reading (oxygen level) today? Doesn't have it available to check.  What is your usual oxygen saturation reading?  (Note: Pulmonary O2 sats should be 90% or greater) 92% at last office visit      1. RESPIRATORY STATUS: Describe your breathing? (e.g., wheezing, shortness of breath, unable to speak, severe coughing)      SOB, productive cough with brown mucous. Cough comes with severe coughing spells. She states she has constant wheezing, improves with using her Breztri  inhaler.  2. ONSET: When did this breathing problem begin?      Yesterday.  3. PATTERN Does the difficult breathing come and go, or has it been constant since it started?      Comes goes.  4. SEVERITY: How bad is your breathing? (e.g., mild, moderate, severe)  She states SOB is not bad at rest, sitting still but experiences it if exerting herself or moving around.  5. RECURRENT SYMPTOM: Have you had difficulty breathing before? If Yes, ask: When was the last time? and What happened that time?      Yes, states she always has some wheezing.  6. CARDIAC HISTORY: Do you have any history of heart disease? (e.g.,  heart attack, angina, bypass surgery, angioplasty)      A fib/a flutter; CHF  7. LUNG HISTORY: Do you have any history of lung disease?  (e.g., pulmonary embolus, asthma, emphysema)     COPD  8. CAUSE: What do you think is causing the breathing problem?      She states her sister is sick with the same thing and she thinks she caught something from her.  9. OTHER SYMPTOMS: Do you have any other symptoms? (e.g., chest pain, cough, dizziness, fever, runny nose)     Denies fever. Started with a sore throat that has resolved; runny nose.  10. O2 SATURATION MONITOR:  Do you use an oxygen saturation monitor (pulse oximeter) at home? If Yes, ask: What is your reading (oxygen level) today? What is your usual oxygen saturation reading? (e.g., 95%)       N/A.  11. PREGNANCY: Is there any chance you are pregnant? When was your last menstrual period?       N/A.  12. TRAVEL: Have you traveled out of the country in the last month? (e.g., travel history, exposures)       No.  Protocols used: Breathing Difficulty-A-AH

## 2024-02-29 NOTE — Progress Notes (Signed)
 Established Patient Pulmonology Office Visit   Subjective:  Patient ID: Leah Mata, female    DOB: April 15, 1945  MRN: [id number]  CC:  Chief Complaint  Patient presents with   COPD    HPI  Leah Mata is a 78 y/o F with a PMH significant for COPD (FEV1 52% predicted) who presents for a sick visit.  Discussed the use of AI scribe software for clinical note transcription with the patient, who gave verbal consent to proceed.  History of Present Illness Leah Mata is a 78 year old female with COPD who presents with a flare-up of symptoms.  Symptoms began two days ago with a severe sore throat, which has since resolved. She now experiences increased shortness of breath and wheezing, which are worse than usual, along with expectorating black sputum.  She is currently using Breztri  inhaler, two puffs in the morning and two puffs in the evening, and she rinses her mouth after use. She has a nebulizer machine but has not been using it during this illness. She does not have any antibiotics or prednisone  at home, but in the past, her medical doctor would provide these during similar flare-ups.  She was exposed to her sister, who was sick, but her sister was not ill during Thanksgiving when she was together.  CAT > 10, MMRC > 2  ROS    Current Outpatient Medications:    ALPRAZolam  (XANAX ) 0.25 MG tablet, TAKE 1 TABLET BY MOUTH TWICE A DAY AS NEEDED FOR ANXIETY, Disp: 60 tablet, Rfl: 2   amLODipine  (NORVASC ) 10 MG tablet, Take 1 tablet (10 mg total) by mouth daily., Disp: 90 tablet, Rfl: 3   apixaban  (ELIQUIS ) 5 MG TABS tablet, Take 1 tablet (5 mg total) by mouth 2 (two) times daily., Disp: 180 tablet, Rfl: 3   Blood Glucose Monitoring Suppl (TRUE METRIX AIR GLUCOSE METER) w/Device KIT, Use as directed four times per day, Disp: 1 kit, Rfl: 3   budesonide -glycopyrrolate -formoterol  (BREZTRI  AEROSPHERE) 160-9-4.8 MCG/ACT AERO inhaler, Take 2 puffs first thing in the morning and then another 2  puffs about 12 hours later., Disp: 10.7 g, Rfl: 11   metoprolol  tartrate (LOPRESSOR ) 50 MG tablet, TAKE 1 TABLET TWICE DAILY, Disp: 180 tablet, Rfl: 2   Multiple Vitamin (MULTIVITAMIN) tablet, Take 1 tablet by mouth daily., Disp: , Rfl:    pravastatin  (PRAVACHOL ) 20 MG tablet, Take 1 tablet (20 mg total) by mouth daily., Disp: 90 tablet, Rfl: 3   acetaminophen  (TYLENOL ) 500 MG tablet, Take 1,000 mg by mouth every 6 (six) hours as needed for mild pain. (Patient not taking: Reported on 02/29/2024), Disp: , Rfl:    albuterol  (PROVENTIL ) (2.5 MG/3ML) 0.083% nebulizer solution, Take 3 mLs (2.5 mg total) by nebulization every 4 (four) hours as needed. (Patient not taking: Reported on 02/29/2024), Disp: , Rfl:    albuterol  (VENTOLIN  HFA) 108 (90 Base) MCG/ACT inhaler, Inhale 2 puffs into the lungs every 4 (four) hours as needed for wheezing or shortness of breath. (Patient not taking: Reported on 02/29/2024), Disp: 6.7 g, Rfl: 0   azithromycin  (ZITHROMAX ) 250 MG tablet, 2 tabs today, then 1 tab daily on days 2-5, Disp: 6 tablet, Rfl: 0   benzonatate  (TESSALON ) 200 MG capsule, Take 1 capsule (200 mg total) by mouth 3 (three) times daily as needed for cough. (Patient not taking: Reported on 02/29/2024), Disp: 20 capsule, Rfl: 0   clonazePAM  (KLONOPIN ) 0.5 MG tablet, Take 1 tablet (0.5 mg total) by mouth 2 (two) times  daily as needed for anxiety. (Patient not taking: Reported on 02/29/2024), Disp: 60 tablet, Rfl: 1   clonazePAM  (KLONOPIN ) 0.5 MG tablet, Take 0.5 mg by mouth 2 (two) times daily as needed for anxiety. (Patient not taking: Reported on 02/29/2024), Disp: , Rfl:    furosemide  (LASIX ) 20 MG tablet, Take 1 tablet (20mg ) by  mouth daily. (Patient not taking: Reported on 02/29/2024), Disp: 30 tablet, Rfl: 2   irbesartan  (AVAPRO ) 300 MG tablet, Take 1 tablet (300 mg total) by mouth daily. (Patient not taking: Reported on 02/29/2024), Disp: 90 tablet, Rfl: 3   predniSONE  (DELTASONE ) 10 MG tablet, Take 1 tablet (10  mg total) by mouth daily with breakfast., Disp: 20 tablet, Rfl: 0      Objective:  BP 135/82 (BP Location: Left Arm, Cuff Size: Large)   Pulse 83   Temp 98.8 F (37.1 C)   Ht 5' 6 (1.676 m)   Wt 194 lb 6.4 oz (88.2 kg)   LMP 03/29/1993 (Approximate)   SpO2 97%   BMI 31.38 kg/m  Wt Readings from Last 3 Encounters:  02/29/24 194 lb 6.4 oz (88.2 kg)  01/20/24 197 lb 3.2 oz (89.4 kg)  12/09/23 194 lb 9.6 oz (88.3 kg)   BMI Readings from Last 3 Encounters:  02/29/24 31.38 kg/m  01/20/24 31.83 kg/m  12/09/23 31.41 kg/m   SpO2 Readings from Last 3 Encounters:  02/29/24 97%  01/20/24 92%  12/09/23 95%    Physical Exam General: NAD, alert, WD, WN Eyes: PERRL, no scleral icterus ENMT: oropharynx clear, good dentition, no oral lesions, mallampati score IV Skin: warm, intact, no rashes Neck: JVD flat, ROM and lymph node assessment normal CV: RRR, no MRG, nl S1 and S2, no peripheral edema Resp: diffuse expiratory wheezing Neuro: Awake alert oriented to person place time and situation  Diagnostic Review:  Last CBC Lab Results  Component Value Date   WBC 11.0 (H) 10/06/2023   HGB 12.0 10/06/2023   HCT 36.7 10/06/2023   MCV 98.7 10/06/2023   MCH 32.3 10/06/2023   RDW 13.0 10/06/2023   PLT 187 10/06/2023   Last metabolic panel Lab Results  Component Value Date   GLUCOSE 149 (H) 01/20/2024   NA 139 01/20/2024   K 4.1 01/20/2024   CL 99 01/20/2024   CO2 24 01/20/2024   BUN 14 01/20/2024   CREATININE 0.98 01/20/2024   EGFR 60 01/20/2024   CALCIUM  10.3 01/20/2024   PHOS 2.7 05/30/2018   PROT 7.7 10/05/2023   ALBUMIN  4.2 10/05/2023   BILITOT 0.6 10/05/2023   ALKPHOS 88 10/05/2023   AST 17 10/05/2023   ALT 18 10/05/2023   ANIONGAP 11 10/07/2023       Assessment & Plan:   Assessment & Plan COPD with acute exacerbation (HCC) Assessment and Plan Assessment & Plan COPD with acute exacerbation Acute exacerbation with increased dyspnea, productive cough with  black sputum, and wheezing. No smoking history or known antibiotic allergies. - Administered prednisone  injection in office. - Prescribed prednisone  dose pack. - Prescribed Z pack - Instructed to perform breathing treatments three times daily for five days, then as needed. - Continue Breztri  2 puffs BID - COVID and Flu rapid tests are negative  Orders Placed This Encounter  Procedures   POCT Influenza A/B   POC COVID-19 BinaxNow   I spent 30 minutes reviewing patient's chart including prior consultant notes, imaging, and PFTs as well as face-to-face with the patient, over half in discussion of the diagnosis and the  importance of compliance with the treatment plan.  Return in about 3 months (around 05/29/2024).   Ravyn Nikkel, MD

## 2024-02-29 NOTE — Assessment & Plan Note (Addendum)
 Assessment and Plan Assessment & Plan COPD with acute exacerbation Acute exacerbation with increased dyspnea, productive cough with black sputum, and wheezing. No smoking history or known antibiotic allergies. - Administered prednisone  injection in office. - Prescribed prednisone  dose pack. - Prescribed Z pack - Instructed to perform breathing treatments three times daily for five days, then as needed. - Continue Breztri  2 puffs BID - COVID and Flu rapid tests are negative

## 2024-02-29 NOTE — Patient Instructions (Signed)
  VISIT SUMMARY: Today, you were seen for a flare-up of your COPD symptoms, including increased shortness of breath, wheezing, and black sputum. You received a prednisone  injection and were prescribed additional medications to help manage your symptoms.  YOUR PLAN: COPD WITH ACUTE EXACERBATION: You are experiencing a worsening of your COPD symptoms, including increased shortness of breath, productive cough with black sputum, and wheezing. -You received a prednisone  injection in the office today. -You have been prescribed a prednisone  dose pack. Please follow the instructions on the pack. -You have been prescribed antibiotics, which have been sent to your pharmacy. Please take them as directed. -Perform breathing treatments three times daily for the next five days, then as needed. -We checked you for influenza and will provide medication if the test is positive.   Contains text generated by [employer].

## 2024-03-02 ENCOUNTER — Other Ambulatory Visit (HOSPITAL_COMMUNITY): Payer: Self-pay

## 2024-03-05 ENCOUNTER — Other Ambulatory Visit (HOSPITAL_COMMUNITY): Payer: Self-pay

## 2024-03-07 ENCOUNTER — Encounter (HOSPITAL_BASED_OUTPATIENT_CLINIC_OR_DEPARTMENT_OTHER): Payer: Self-pay | Admitting: Cardiology

## 2024-03-30 ENCOUNTER — Other Ambulatory Visit: Payer: Self-pay

## 2024-03-30 ENCOUNTER — Encounter: Payer: Self-pay | Admitting: Internal Medicine

## 2024-03-30 ENCOUNTER — Other Ambulatory Visit (HOSPITAL_COMMUNITY): Payer: Self-pay

## 2024-03-30 ENCOUNTER — Ambulatory Visit: Payer: Self-pay | Admitting: Internal Medicine

## 2024-03-30 ENCOUNTER — Ambulatory Visit: Admitting: Internal Medicine

## 2024-03-30 VITALS — BP 120/78 | HR 74 | Temp 98.3°F | Ht 66.0 in | Wt 191.0 lb

## 2024-03-30 DIAGNOSIS — E7800 Pure hypercholesterolemia, unspecified: Secondary | ICD-10-CM

## 2024-03-30 DIAGNOSIS — E559 Vitamin D deficiency, unspecified: Secondary | ICD-10-CM | POA: Diagnosis not present

## 2024-03-30 DIAGNOSIS — I10 Essential (primary) hypertension: Secondary | ICD-10-CM

## 2024-03-30 DIAGNOSIS — R7303 Prediabetes: Secondary | ICD-10-CM

## 2024-03-30 DIAGNOSIS — F419 Anxiety disorder, unspecified: Secondary | ICD-10-CM

## 2024-03-30 LAB — LIPID PANEL
Cholesterol: 164 mg/dL (ref 28–200)
HDL: 51.4 mg/dL
LDL Cholesterol: 71 mg/dL (ref 10–99)
NonHDL: 112.9
Total CHOL/HDL Ratio: 3
Triglycerides: 209 mg/dL — ABNORMAL HIGH (ref 10.0–149.0)
VLDL: 41.8 mg/dL — ABNORMAL HIGH (ref 0.0–40.0)

## 2024-03-30 LAB — BASIC METABOLIC PANEL WITH GFR
BUN: 21 mg/dL (ref 6–23)
CO2: 27 meq/L (ref 19–32)
Calcium: 10.5 mg/dL (ref 8.4–10.5)
Chloride: 101 meq/L (ref 96–112)
Creatinine, Ser: 1.12 mg/dL (ref 0.40–1.20)
GFR: 47.6 mL/min — ABNORMAL LOW
Glucose, Bld: 176 mg/dL — ABNORMAL HIGH (ref 70–99)
Potassium: 4.4 meq/L (ref 3.5–5.1)
Sodium: 137 meq/L (ref 135–145)

## 2024-03-30 LAB — HEPATIC FUNCTION PANEL
ALT: 22 U/L (ref 3–35)
AST: 21 U/L (ref 5–37)
Albumin: 4.2 g/dL (ref 3.5–5.2)
Alkaline Phosphatase: 83 U/L (ref 39–117)
Bilirubin, Direct: 0.1 mg/dL (ref 0.1–0.3)
Total Bilirubin: 0.6 mg/dL (ref 0.2–1.2)
Total Protein: 7.6 g/dL (ref 6.0–8.3)

## 2024-03-30 LAB — HEMOGLOBIN A1C: Hgb A1c MFr Bld: 6.8 % — ABNORMAL HIGH (ref 4.6–6.5)

## 2024-03-30 MED ORDER — ALPRAZOLAM 0.25 MG PO TABS
0.2500 mg | ORAL_TABLET | Freq: Two times a day (BID) | ORAL | 1 refills | Status: AC | PRN
  Filled 2024-03-30: qty 60, 30d supply, fill #0

## 2024-03-30 NOTE — Patient Instructions (Addendum)
 Ok to change the klonopin  back the alprazolam  as directed  Please continue all other medications as before, and refills have been done if requested.  Please have the pharmacy call with any other refills you may need.  Please continue your efforts at being more active, low cholesterol diet, and weight control.  Please keep your appointments with your specialists as you may have planned  Please go to the LAB at the blood drawing area for the tests to be done  You will be contacted by phone if any changes need to be made immediately.  Otherwise, you will receive a letter about your results with an explanation, but please check with MyChart first.  Please make an Appointment to return in 6 months, or sooner if needed

## 2024-03-30 NOTE — Assessment & Plan Note (Signed)
 BP Readings from Last 3 Encounters:  03/30/24 120/78  02/29/24 135/82  01/20/24 120/70   Stable, pt to continue medical treatment norvasc  10 every day, avapro  300 mg every day, lopressor  50 bid

## 2024-03-30 NOTE — Assessment & Plan Note (Signed)
 Lab Results  Component Value Date   LDLCALC 62 09/27/2023   Stable, pt to continue current statin pravachol  20 mg qd

## 2024-03-30 NOTE — Progress Notes (Signed)
 Patient ID: Leah Mata, female   DOB: 07-18-1945, 79 y.o.   MRN: [id number]        Chief Complaint: follow up HTN, HLD and hyperglycemia, low vit d       HPI:  Leah Mata is a 79 y.o. female here overall doing ok, Pt denies chest pain, increased sob or doe, wheezing, orthopnea, PND, increased LE swelling, palpitations, dizziness or syncope.   Pt denies polydipsia, polyuria, or new focal neuro s/s.    Pt denies fever, wt loss, night sweats, loss of appetite, or other constitutional symptoms   Denies worsening depressive symptoms, suicidal ideation, or panic; has ongoing anxiety, with worsening on the klonopin  prn - asks to change back to lowest dose alprazolam  prn.       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 Her smoking use included cigarettes. She started smoking about 36 years ago.  She has a 15 pack-year smoking history. She has never used smokeless tobacco. She reports that she does not drink alcohol and does not use drugs. family history includes Congestive Heart Failure in her mother; Coronary artery disease in an other family member; Diabetes in her father and mother; Heart attack in her father; Hypertension in her mother. Allergies[1] Medications Ordered Prior to Encounter[2]      ROS:  All others reviewed and negative.  Objective        PE:  BP 120/78 (BP Location: Left Arm, Patient Position: Sitting, Cuff Size: Normal)   Pulse 74   Temp 98.3 F (36.8 C) (Oral)   Ht 5' 6 (1.676 m)   Wt 191 lb (86.6 kg)   LMP 03/29/1993   SpO2 93%   BMI 30.83 kg/m                 Constitutional: Pt appears in NAD               HENT: Head: NCAT.                Right Ear: External ear normal.                 Left Ear: External ear normal.  Eyes: . Pupils are equal, round, and reactive to light. Conjunctivae and EOM are normal               Nose: without d/c or deformity               Neck: Neck supple. Gross normal ROM               Cardiovascular: Normal rate and regular rhythm.                 Pulmonary/Chest: Effort normal and breath sounds without rales or wheezing.                Abd:  Soft, NT, ND, + BS, no organomegaly               Neurological: Pt is alert. At baseline orientation, motor grossly intact               Skin: Skin is warm. No rashes, no other new lesions, LE edema - none               Psychiatric: Pt behavior is normal without agitation   Micro: none  Cardiac tracings I have personally interpreted today:  none  Pertinent Radiological findings (summarize): none   Lab Results  Component Value Date   WBC 11.0 (H) 10/06/2023   HGB 12.0 10/06/2023   HCT 36.7 10/06/2023   PLT 187 10/06/2023   GLUCOSE 149 (H) 01/20/2024   CHOL 158 09/27/2023   TRIG 225.0 (H) 09/27/2023   HDL 51.40 09/27/2023   LDLDIRECT 79.0 04/20/2021   LDLCALC  62 09/27/2023   ALT 18 10/05/2023   AST 17 10/05/2023   NA 139 01/20/2024   K 4.1 01/20/2024   CL 99 01/20/2024   CREATININE 0.98 01/20/2024   BUN 14 01/20/2024   CO2 24 01/20/2024   TSH 1.24 09/27/2023   INR 1.16 04/30/2018   HGBA1C 6.4 09/27/2023   Assessment/Plan:  Leah Mata is a 79 y.o. White or Caucasian [1] female with  has a past medical history of BACK PAIN (03/14/2009), COPD (11/01/2006), Dyspnea, HYPERTENSION (11/01/2006), Hypertrophy of tongue papillae (06/05/2009), Other and unspecified hyperlipidemia (08/22/2013), and Pre-diabetes.  Vitamin D  deficiency Last vitamin D  Lab Results  Component Value Date   VD25OH 34.75 09/27/2023   Low, to start oral replacement   Pre-diabetes Lab Results  Component Value Date   HGBA1C 6.4 09/27/2023   Stable, pt to continue current medical treatment  - diet, wt control, and f/u lab today   Hyperlipidemia Lab Results  Component Value Date   LDLCALC 62 09/27/2023   Stable, pt to continue current statin pravachol  20 mg qd   Essential hypertension BP Readings from Last 3 Encounters:  03/30/24 120/78  02/29/24 135/82  01/20/24 120/70   Stable, pt to continue medical treatment norvasc  10 every day, avapro  300 mg every day, lopressor  50 bid  Anxiety With mild worsening, for change to alprazolam  .25 mg bid prn only, declines SSRI or counseling  Followup: Return in about 6 months (around 09/27/2024).  Lynwood Rush, MD 03/30/2024 10:48 AM [facility]    [1]  Allergies Allergen Reactions   Hydrochlorothiazide Other (See Comments)    Reaction not known  [2]  Current Outpatient Medications on File Prior to Visit  Medication Sig Dispense Refill   acetaminophen  (TYLENOL ) 500 MG tablet Take 1,000 mg by mouth every 6 (six) hours as needed  for mild pain. (Patient not taking: Reported on 02/29/2024)     albuterol  (PROVENTIL ) (2.5 MG/3ML) 0.083% nebulizer  solution Take 3 mLs (2.5 mg total) by nebulization every 4 (four) hours as needed. (Patient not taking: Reported on 02/29/2024)     albuterol  (VENTOLIN  HFA) 108 (90 Base) MCG/ACT inhaler Inhale 2 puffs into the lungs every 4 (four) hours as needed for wheezing or shortness of breath. (Patient not taking: Reported on 02/29/2024) 6.7 g 0   amLODipine  (NORVASC ) 10 MG tablet Take 1 tablet (10 mg total) by mouth daily. 90 tablet 3   apixaban  (ELIQUIS ) 5 MG TABS tablet Take 1 tablet (5 mg total) by mouth 2 (two) times daily. 180 tablet 3   benzonatate  (TESSALON ) 200 MG capsule Take 1 capsule (200 mg total) by mouth 3 (three) times daily as needed for cough. (Patient not taking: Reported on 02/29/2024) 20 capsule 0   Blood Glucose Monitoring Suppl (TRUE METRIX AIR GLUCOSE METER) w/Device KIT Use as directed four times per day 1 kit 3   budesonide -glycopyrrolate -formoterol  (BREZTRI  AEROSPHERE) 160-9-4.8 MCG/ACT AERO inhaler Take 2 puffs first thing in the morning and then another 2 puffs about 12 hours later. 10.7 g 11   furosemide  (LASIX ) 20 MG tablet Take 1 tablet (20mg ) by  mouth daily. (Patient not taking: Reported on 02/29/2024) 30 tablet 2   irbesartan  (AVAPRO ) 300 MG tablet Take 1 tablet (300 mg total) by mouth daily. (Patient not taking: Reported on 02/29/2024) 90 tablet 3   metoprolol  tartrate (LOPRESSOR ) 50 MG tablet TAKE 1 TABLET TWICE DAILY 180 tablet 2   Multiple Vitamin (MULTIVITAMIN) tablet Take 1 tablet by mouth daily.     pravastatin  (PRAVACHOL ) 20 MG tablet Take 1 tablet (20 mg total) by mouth daily. 90 tablet 3   predniSONE  (DELTASONE ) 10 MG tablet Take 1 tablet (10 mg total) by mouth daily with breakfast. 20 tablet 0   No current facility-administered medications on file prior to visit.

## 2024-03-30 NOTE — Assessment & Plan Note (Signed)
 Last vitamin D  Lab Results  Component Value Date   VD25OH 34.75 09/27/2023   Low, to start oral replacement

## 2024-03-30 NOTE — Progress Notes (Signed)
 The test results show that your current treatment is OK, as the tests are stable.  Please continue the same plan.  There is no other need for change of treatment or further evaluation based on these results, at this time.  thanks

## 2024-03-30 NOTE — Assessment & Plan Note (Signed)
 Lab Results  Component Value Date   HGBA1C 6.4 09/27/2023   Stable, pt to continue current medical treatment  - diet, wt control, and f/u lab today

## 2024-03-30 NOTE — Assessment & Plan Note (Signed)
 With mild worsening, for change to alprazolam  .25 mg bid prn only, declines SSRI or counseling

## 2024-04-02 ENCOUNTER — Other Ambulatory Visit (HOSPITAL_COMMUNITY): Payer: Self-pay

## 2024-04-02 ENCOUNTER — Other Ambulatory Visit: Payer: Self-pay

## 2024-04-02 ENCOUNTER — Encounter: Payer: Self-pay | Admitting: Pharmacist

## 2024-04-04 ENCOUNTER — Other Ambulatory Visit: Payer: Self-pay

## 2024-04-05 ENCOUNTER — Other Ambulatory Visit: Payer: Self-pay

## 2024-04-11 ENCOUNTER — Other Ambulatory Visit (HOSPITAL_COMMUNITY): Payer: Self-pay

## 2024-04-12 ENCOUNTER — Other Ambulatory Visit: Payer: Self-pay

## 2024-04-18 ENCOUNTER — Encounter: Payer: Self-pay | Admitting: Adult Health

## 2024-04-18 ENCOUNTER — Ambulatory Visit: Admitting: Adult Health

## 2024-04-18 ENCOUNTER — Ambulatory Visit: Payer: Self-pay

## 2024-04-18 VITALS — BP 110/60 | HR 93 | Temp 101.3°F | Resp 28

## 2024-04-18 DIAGNOSIS — J020 Streptococcal pharyngitis: Secondary | ICD-10-CM | POA: Diagnosis not present

## 2024-04-18 DIAGNOSIS — J441 Chronic obstructive pulmonary disease with (acute) exacerbation: Secondary | ICD-10-CM

## 2024-04-18 LAB — POC COVID19 BINAXNOW: SARS Coronavirus 2 Ag: NEGATIVE

## 2024-04-18 LAB — POCT INFLUENZA A/B
Influenza A, POC: NEGATIVE
Influenza B, POC: NEGATIVE

## 2024-04-18 LAB — POCT RAPID STREP A (OFFICE): Rapid Strep A Screen: POSITIVE — AB

## 2024-04-18 MED ORDER — AMOXICILLIN-POT CLAVULANATE 875-125 MG PO TABS: 1.0000 | ORAL_TABLET | Freq: Two times a day (BID) | ORAL | 0 refills | Status: AC

## 2024-04-18 MED ORDER — PREDNISONE 10 MG PO TABS: ORAL_TABLET | ORAL | 0 refills | Status: AC

## 2024-04-18 NOTE — Progress Notes (Signed)
 "  Acute Office Visit  Subjective:     Patient ID: Leah Mata, female    DOB: 08-06-1945, 79 y.o.   MRN: [id number]  Patient is in today with complaints of non-productive cough, shortness of breath, sore throat, fever, and chills for 2 days. Her shortness of breath is worse with exertion. She states that she has been using her inhaler and nebulizer treatments for wheezing and shortness of breath and reports that she gets temporary relief with these measures. She has been taking acetaminophen  for her fever - last dose around noon today. Patient states that the acetaminophen  is helping with her discomfort.    Review of Systems  Respiratory:  Positive for cough, shortness of breath and wheezing.   See HPI     Objective:    BP 110/60   Pulse 93   Temp (!) 101.3 F (38.5 C)   Resp (!) 28   LMP 03/29/1993   SpO2 96%    Physical Exam Vitals reviewed.  Constitutional:      Appearance: She is obese. She is ill-appearing.  HENT:     Head: Normocephalic and atraumatic.     Nose: Nose normal.     Mouth/Throat:     Mouth: Mucous membranes are moist.     Pharynx: Oropharynx is clear. Posterior oropharyngeal erythema present.  Eyes:     Conjunctiva/sclera: Conjunctivae normal.     Pupils: Pupils are equal, round, and reactive to light.  Cardiovascular:     Rate and Rhythm: Normal rate and regular rhythm.     Pulses: Normal pulses.     Heart sounds: Normal heart sounds.  Pulmonary:     Effort: Tachypnea and prolonged expiration present.     Breath sounds: Examination of the right-upper field reveals wheezing. Examination of the left-upper field reveals wheezing. Examination of the right-lower field reveals decreased breath sounds. Examination of the left-lower field reveals decreased breath sounds. Decreased breath sounds and wheezing present.     Comments: Increased respiratory effort Abdominal:     General: Bowel sounds are normal.     Palpations: Abdomen is soft.   Musculoskeletal:     Cervical back: Normal range of motion and neck supple.  Lymphadenopathy:     Cervical: No cervical adenopathy.  Skin:    General: Skin is warm and dry.     Capillary Refill: Capillary refill takes less than 2 seconds.  Neurological:     Mental Status: She is alert and oriented to person, place, and time.  Psychiatric:        Mood and Affect: Mood normal.        Thought Content: Thought content normal.     Results for orders placed or performed in visit on 04/18/24  POC Rapid Strep A  Result Value Ref Range   Rapid Strep A Screen Positive (A) Negative  POC COVID-19 BinaxNow  Result Value Ref Range   SARS Coronavirus 2 Ag Negative Negative  POC Influenza A/B  Result Value Ref Range   Influenza A, POC Negative Negative   Influenza B, POC Negative Negative        Assessment & Plan:   1. COPD with acute exacerbation (HCC) - Likely triggered by strep throat - POC COVID-19 BinaxNow - negative - POC Influenza A/B - negative - predniSONE  (DELTASONE ) 10 MG tablet; 40 mg x 3 days, 20 mg x 3 days, 10 mg x 3 days  Dispense: 21 tablet; Refill: 0 - Continue MDI and nebulizer treatments as  directed by pulmonary - Increase oral fluids  2. Strep throat - POC Rapid Strep A - positive - amoxicillin -clavulanate (AUGMENTIN ) 875-125 MG tablet; Take 1 tablet by mouth 2 (two) times daily.  Dispense: 20 tablet; Refill: 0  - Acetaminophen  as directed for fever/discomfort   - If symptoms do not improve or worsen, go to the nearest emergency department   Debby CHRISTELLA Borer, RN FNP Student  "

## 2024-04-18 NOTE — Telephone Encounter (Signed)
 FYI Only or Action Required?: FYI only for provider: appointment scheduled on 04/18/24.  Patient was last seen in primary care on 03/30/2024 by Norleen Lynwood ORN, MD.  Called Nurse Triage reporting Breathing Problem.  Symptoms began yesterday.  Interventions attempted: Prescription medications: albuterol .  Symptoms are: unchanged.  Triage Disposition: See HCP Within 4 Hours (Or PCP Triage)  Patient/caregiver understands and will follow disposition?: Yes    Message from [facility] sent at 04/18/2024  2:00 PM EST  Reason for Triage: Coughing, congested, chills, fever, shortness of breath on exertion    Have not check  Rescue inhaler 2 puff am, nebulizer tid Reason for Disposition  [1] MILD difficulty breathing (e.g., minimal/no SOB at rest, SOB with walking, pulse < 100) AND [2] NEW-onset or WORSE than normal  Answer Assessment - Initial Assessment Questions Scheduled with alt prov 04/18/24  Advised call back or ED/911 if symptoms occur/worsen: severe diff breathing, chest pain > 5 min, faint. Patient verbalized understanding.   1. RESPIRATORY STATUS: Describe your breathing? (e.g., wheezing, shortness of breath, unable to speak, severe coughing)      Sob exertion, cough, congestion, chills, dark/ black/phlegm, wheezing, denies blood, faint, chest pain, fever, n/v 2. ONSET: When did this breathing problem begin?      yesterday 3. PATTERN Does the difficult breathing come and go, or has it been constant since it started?      Comes and goes 4. SEVERITY: How bad is your breathing? (e.g., mild, moderate, severe)      moderate  6. CARDIAC HISTORY: Do you have any history of heart disease? (e.g., heart attack, angina, bypass surgery, angioplasty)      usnure 7. LUNG HISTORY: Do you have any history of lung disease?  (e.g., pulmonary embolus, asthma, emphysema)     copd 8. CAUSE: What do you think is causing the breathing problem?    Reports son has same symptoms 9. OTHER  SYMPTOMS: Do you have any other symptoms? (e.g., chest pain, cough, dizziness, fever, runny nose)     Runny nose, HA, dizziness intermittent 10. O2 SATURATION MONITOR:  Do you use an oxygen saturation monitor (pulse oximeter) at home? If Yes, ask: What is your reading (oxygen level) today? What is your usual oxygen saturation reading? (e.g., 95%)       Does not check  Protocols used: Breathing Difficulty-A-AH

## 2024-04-24 ENCOUNTER — Other Ambulatory Visit (HOSPITAL_COMMUNITY): Payer: Self-pay

## 2024-04-24 ENCOUNTER — Encounter (HOSPITAL_COMMUNITY): Payer: Self-pay | Admitting: Pharmacist

## 2024-04-25 ENCOUNTER — Other Ambulatory Visit (HOSPITAL_COMMUNITY): Payer: Self-pay

## 2024-04-25 ENCOUNTER — Other Ambulatory Visit: Payer: Self-pay

## 2024-06-13 ENCOUNTER — Ambulatory Visit (HOSPITAL_BASED_OUTPATIENT_CLINIC_OR_DEPARTMENT_OTHER): Admitting: Pulmonary Disease

## 2024-07-18 ENCOUNTER — Ambulatory Visit: Admitting: Internal Medicine

## 2024-12-12 ENCOUNTER — Ambulatory Visit
